# Patient Record
Sex: Female | Born: 1940 | Race: White | Hispanic: No | Smoking: Former smoker
Health system: Southern US, Community
[De-identification: ages and names within clinical notes are randomized; demographics above are authoritative.]

## PROBLEM LIST (undated history)

## (undated) DIAGNOSIS — F419 Anxiety disorder, unspecified: Secondary | ICD-10-CM

## (undated) DIAGNOSIS — I10 Essential (primary) hypertension: Secondary | ICD-10-CM

## (undated) DIAGNOSIS — I2510 Atherosclerotic heart disease of native coronary artery without angina pectoris: Secondary | ICD-10-CM

## (undated) DIAGNOSIS — Z87312 Personal history of (healed) stress fracture: Secondary | ICD-10-CM

## (undated) DIAGNOSIS — Z8672 Personal history of thrombophlebitis: Secondary | ICD-10-CM

## (undated) DIAGNOSIS — M1990 Unspecified osteoarthritis, unspecified site: Secondary | ICD-10-CM

## (undated) DIAGNOSIS — N189 Chronic kidney disease, unspecified: Secondary | ICD-10-CM

## (undated) DIAGNOSIS — J189 Pneumonia, unspecified organism: Secondary | ICD-10-CM

## (undated) DIAGNOSIS — C801 Malignant (primary) neoplasm, unspecified: Secondary | ICD-10-CM

## (undated) DIAGNOSIS — M8580 Other specified disorders of bone density and structure, unspecified site: Secondary | ICD-10-CM

## (undated) DIAGNOSIS — K219 Gastro-esophageal reflux disease without esophagitis: Secondary | ICD-10-CM

## (undated) HISTORY — DX: Personal history of (healed) stress fracture: Z87.312

## (undated) HISTORY — PX: RETINAL DETACHMENT SURGERY: SHX105

## (undated) HISTORY — DX: Personal history of thrombophlebitis: Z86.72

## (undated) HISTORY — DX: Other specified disorders of bone density and structure, unspecified site: M85.80

## (undated) HISTORY — PX: HERNIA REPAIR: SHX51

## (undated) HISTORY — PX: REPLACEMENT TOTAL KNEE: SUR1224

## (undated) HISTORY — PX: THYROIDECTOMY, PARTIAL: SHX18

## (undated) HISTORY — PX: CATARACT EXTRACTION W/ INTRAOCULAR LENS IMPLANT: SHX1309

## (undated) HISTORY — PX: FOOT SURGERY: SHX648

## (undated) HISTORY — DX: Unspecified osteoarthritis, unspecified site: M19.90

## (undated) HISTORY — DX: Essential (primary) hypertension: I10

---

## 1995-06-02 HISTORY — PX: KNEE ARTHROSCOPY: SUR90

## 2003-06-02 HISTORY — PX: THYROIDECTOMY, PARTIAL: SHX18

## 2005-06-01 LAB — HM COLONOSCOPY

## 2006-06-01 HISTORY — PX: RETINAL DETACHMENT SURGERY: SHX105

## 2007-06-02 LAB — HM COLONOSCOPY

## 2008-06-08 DIAGNOSIS — R911 Solitary pulmonary nodule: Secondary | ICD-10-CM | POA: Insufficient documentation

## 2008-06-08 DIAGNOSIS — M25559 Pain in unspecified hip: Secondary | ICD-10-CM | POA: Insufficient documentation

## 2010-02-27 DIAGNOSIS — R69 Illness, unspecified: Secondary | ICD-10-CM | POA: Insufficient documentation

## 2010-07-10 DIAGNOSIS — S92353A Displaced fracture of fifth metatarsal bone, unspecified foot, initial encounter for closed fracture: Secondary | ICD-10-CM | POA: Insufficient documentation

## 2010-09-17 DIAGNOSIS — L503 Dermatographic urticaria: Secondary | ICD-10-CM | POA: Insufficient documentation

## 2010-09-17 DIAGNOSIS — L820 Inflamed seborrheic keratosis: Secondary | ICD-10-CM | POA: Insufficient documentation

## 2010-10-24 DIAGNOSIS — N952 Postmenopausal atrophic vaginitis: Secondary | ICD-10-CM | POA: Insufficient documentation

## 2011-06-02 DIAGNOSIS — Z87312 Personal history of (healed) stress fracture: Secondary | ICD-10-CM

## 2011-06-02 HISTORY — DX: Personal history of (healed) stress fracture: Z87.312

## 2011-07-06 DIAGNOSIS — F419 Anxiety disorder, unspecified: Secondary | ICD-10-CM | POA: Insufficient documentation

## 2011-07-06 DIAGNOSIS — F32A Depression, unspecified: Secondary | ICD-10-CM | POA: Insufficient documentation

## 2011-07-06 DIAGNOSIS — K5730 Diverticulosis of large intestine without perforation or abscess without bleeding: Secondary | ICD-10-CM | POA: Insufficient documentation

## 2011-07-06 DIAGNOSIS — E042 Nontoxic multinodular goiter: Secondary | ICD-10-CM | POA: Insufficient documentation

## 2011-08-13 DIAGNOSIS — L859 Epidermal thickening, unspecified: Secondary | ICD-10-CM | POA: Insufficient documentation

## 2011-09-20 DIAGNOSIS — M2010 Hallux valgus (acquired), unspecified foot: Secondary | ICD-10-CM | POA: Insufficient documentation

## 2012-01-19 DIAGNOSIS — M79643 Pain in unspecified hand: Secondary | ICD-10-CM | POA: Insufficient documentation

## 2012-01-19 DIAGNOSIS — M5030 Other cervical disc degeneration, unspecified cervical region: Secondary | ICD-10-CM | POA: Insufficient documentation

## 2012-01-19 DIAGNOSIS — M542 Cervicalgia: Secondary | ICD-10-CM | POA: Insufficient documentation

## 2012-01-19 DIAGNOSIS — M545 Low back pain, unspecified: Secondary | ICD-10-CM | POA: Insufficient documentation

## 2012-01-19 DIAGNOSIS — M25669 Stiffness of unspecified knee, not elsewhere classified: Secondary | ICD-10-CM | POA: Insufficient documentation

## 2012-04-13 DIAGNOSIS — R350 Frequency of micturition: Secondary | ICD-10-CM | POA: Insufficient documentation

## 2012-06-01 LAB — HM PAP SMEAR: HM PAP: NORMAL

## 2012-07-13 DIAGNOSIS — M7740 Metatarsalgia, unspecified foot: Secondary | ICD-10-CM | POA: Insufficient documentation

## 2012-10-30 HISTORY — PX: REPLACEMENT TOTAL KNEE: SUR1224

## 2013-12-30 LAB — HM MAMMOGRAPHY: HM Mammogram: NORMAL

## 2015-02-20 DIAGNOSIS — H2513 Age-related nuclear cataract, bilateral: Secondary | ICD-10-CM | POA: Diagnosis not present

## 2015-03-05 ENCOUNTER — Encounter: Payer: Self-pay | Admitting: Behavioral Health

## 2015-03-05 ENCOUNTER — Telehealth: Payer: Self-pay | Admitting: Behavioral Health

## 2015-03-05 NOTE — Telephone Encounter (Signed)
Pre-Visit Call completed with patient and chart updated.   Pre-Visit Info documented in Specialty Comments under [employer].    

## 2015-03-06 ENCOUNTER — Encounter: Payer: Self-pay | Admitting: Family

## 2015-03-06 ENCOUNTER — Ambulatory Visit: Payer: Self-pay | Admitting: Family

## 2015-03-06 ENCOUNTER — Ambulatory Visit (INDEPENDENT_AMBULATORY_CARE_PROVIDER_SITE_OTHER): Payer: Medicare Other | Admitting: Family

## 2015-03-06 VITALS — BP 140/73 | HR 56 | Temp 98.6°F | Resp 16 | Ht 61.0 in | Wt 167.4 lb

## 2015-03-06 DIAGNOSIS — Z23 Encounter for immunization: Secondary | ICD-10-CM | POA: Diagnosis not present

## 2015-03-06 DIAGNOSIS — Z9289 Personal history of other medical treatment: Secondary | ICD-10-CM

## 2015-03-06 DIAGNOSIS — M8580 Other specified disorders of bone density and structure, unspecified site: Secondary | ICD-10-CM | POA: Diagnosis not present

## 2015-03-06 DIAGNOSIS — N289 Disorder of kidney and ureter, unspecified: Secondary | ICD-10-CM

## 2015-03-06 DIAGNOSIS — K219 Gastro-esophageal reflux disease without esophagitis: Secondary | ICD-10-CM | POA: Diagnosis not present

## 2015-03-06 DIAGNOSIS — Z960 Presence of urogenital implants: Secondary | ICD-10-CM

## 2015-03-06 DIAGNOSIS — I10 Essential (primary) hypertension: Secondary | ICD-10-CM

## 2015-03-06 LAB — BASIC METABOLIC PANEL
BUN: 27 mg/dL — AB (ref 6–23)
CALCIUM: 10.1 mg/dL (ref 8.4–10.5)
CO2: 31 mEq/L (ref 19–32)
CREATININE: 1.17 mg/dL (ref 0.40–1.20)
Chloride: 101 mEq/L (ref 96–112)
GFR: 47.99 mL/min — AB (ref >60.00)
GLUCOSE: 86 mg/dL (ref 70–99)
POTASSIUM: 3.6 meq/L (ref 3.5–5.1)
Sodium: 141 mEq/L (ref 135–145)

## 2015-03-06 NOTE — Progress Notes (Signed)
Pre visit review using our clinic review tool, if applicable. No additional management support is needed unless otherwise documented below in the visit note. 

## 2015-03-06 NOTE — Patient Instructions (Signed)
Please complete lab work prior to leaving. Schedule a medicare wellness at the front desk. Welcome to [facility]!

## 2015-03-06 NOTE — Progress Notes (Signed)
Subjective:    Patient ID: Margaret [name], female    DOB: 07/17/1940, 74 y.o.   MRN: [id number]  HPI  Margaret [name] is a 74 yr old female who presents today to establish care.   Pmhx is significant for HTN.  She is maintained on triamterene-HCTZ and atenolol.  BP Readings from Last 3 Encounters:  03/06/15 140/73   Osteopenia- Hx of stress fractures in her feet. Reports she was on fosamax "for years" (>10 yrs) but this was discontinued due to concern that fosamax was contributing to fracures.  She takes calcium supplement.  GERD-  Uses prilosec very infrequently  Patient reports that she has pain/burning in her feet at night which is not every night but is "awful" when it occurs. Pt reports that she was tested for b12 level and was normal.   She reports that she has hx of superficial blood clots when she was younger but has not had any since.  Never required any anticoagulation.  She has a vaginal pessary.   Review of Systems  Constitutional: Negative for unexpected weight change.  HENT: Negative for hearing loss and rhinorrhea.   Eyes: Negative for visual disturbance.  Respiratory: Negative for cough.   Cardiovascular: Negative for leg swelling.  Gastrointestinal: Negative for diarrhea and constipation.  Genitourinary: Negative for dysuria and frequency.  Musculoskeletal: Negative for myalgias and arthralgias.  Skin:       Occasional hives when she is nervous- has derm apt next week.  Neurological:       Occasional tension headaches  Hematological: Negative for adenopathy.  Psychiatric/Behavioral:       Denies depression/anxiety     Past Medical History  Diagnosis Date  . Hypertension   . Arthritis   . History of healed stress fracture 2013    bilateral feet    Social History   Social History  . Marital Status: Married    Spouse Name: N/A  . Number of Children: N/A  . Years of Education: N/A   Occupational History  . Not on file.   Social History Main  Topics  . Smoking status: Former Smoker    Quit date: 06/02/1979  . Smokeless tobacco: Not on file  . Alcohol Use: 0.6 oz/week    1 Glasses of wine per week  . Drug Use: No  . Sexual Activity: Not on file   Other Topics Concern  . Not on file   Social History Narrative  . No narrative on file    Past Surgical History  Procedure Laterality Date  . Thyroidectomy, partial  2005  . Retinal detachment surgery Left 2008  . 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 Knee arthroscopy Right 1997  . Foot surgery Left     bunion, hammer toe surgery  . Foot surgery Right     shaved bunion, hammer toe correction    Family History  Problem Relation Age of Onset  . Heart attack Mother   . Heart disease Father   . Diabetes Maternal Grandmother   . Cancer Paternal Grandfather     stomach    Allergies  Allergen Reactions  . Percocet [Oxycodone-Acetaminophen] Nausea And Vomiting and Other (See Comments)    Passed out    No current outpatient prescriptions on file prior to visit.   No current facility-administered medications on file prior to visit.    BP 140/73 mmHg  Pulse 56  Temp(Src) 98.6 F (37 C) (Oral)  Resp 16  Ht 5\' 1"  (1.549 m)  Wt 167 lb 6.4 oz (75.932 kg)  BMI 31.65 kg/m2  SpO2 99%       Objective:   Physical Exam  Constitutional: She is oriented to person, place, and time. She appears well-developed and well-nourished.  HENT:  Head: Normocephalic and atraumatic.  Right Ear: Tympanic membrane and ear canal normal.  Left Ear: Tympanic membrane and ear canal normal.  Mouth/Throat: No oropharyngeal exudate, posterior oropharyngeal edema or posterior oropharyngeal erythema.  Eyes: No scleral icterus.  Neck: No thyromegaly present.  Cardiovascular: Normal rate, regular rhythm and normal heart sounds.   No murmur heard. Pulmonary/Chest: Effort normal and breath sounds normal. No respiratory distress. She has no wheezes.  Musculoskeletal: She exhibits no edema.    Lymphadenopathy:    She has no cervical adenopathy.  Neurological: She is alert and oriented to person, place, and time.  Skin: Skin is warm and dry.  Psychiatric: She has a normal mood and affect. Her behavior is normal. Judgment and thought content normal.          Assessment & Plan:  Flu shot today Obtain bmet to assess renal function and electrolytes due to HTN/BP meds

## 2015-03-06 NOTE — Assessment & Plan Note (Signed)
Stable with rare use of prilosec.

## 2015-03-06 NOTE — Assessment & Plan Note (Signed)
BP fair on current meds.  Pt states usually better readings at home. Monitor on current meds.

## 2015-03-06 NOTE — Assessment & Plan Note (Signed)
Was on fosamax x >10 years.

## 2015-03-11 ENCOUNTER — Telehealth: Payer: Self-pay | Admitting: Family

## 2015-03-11 DIAGNOSIS — Z1231 Encounter for screening mammogram for malignant neoplasm of breast: Secondary | ICD-10-CM

## 2015-03-11 NOTE — Telephone Encounter (Signed)
Relation to PY:PPJK Call back number:[phone]   Reason for call:  Patient requesting mamo rx patient would like to go to [place] in [facility]

## 2015-03-12 NOTE — Telephone Encounter (Signed)
Referral placed. Left detailed message on pt's home # with radiology # to call and schedule mammogram.

## 2015-03-25 ENCOUNTER — Ambulatory Visit (HOSPITAL_BASED_OUTPATIENT_CLINIC_OR_DEPARTMENT_OTHER)
Admission: RE | Admit: 2015-03-25 | Discharge: 2015-03-25 | Disposition: A | Discharge status: Home / Self Care | Payer: Medicare Other | Source: Ambulatory Visit | Attending: Family | Admitting: Family

## 2015-03-25 DIAGNOSIS — Z1231 Encounter for screening mammogram for malignant neoplasm of breast: Secondary | ICD-10-CM | POA: Diagnosis not present

## 2015-06-21 ENCOUNTER — Ambulatory Visit: Payer: Self-pay | Admitting: Family Medicine

## 2015-10-14 ENCOUNTER — Encounter: Payer: Self-pay | Admitting: Family

## 2015-10-14 ENCOUNTER — Ambulatory Visit (INDEPENDENT_AMBULATORY_CARE_PROVIDER_SITE_OTHER): Payer: Medicare Other | Admitting: Family

## 2015-10-14 VITALS — BP 124/82 | HR 54 | Temp 97.4°F | Resp 18 | Ht 61.0 in | Wt 169.8 lb

## 2015-10-14 DIAGNOSIS — G629 Polyneuropathy, unspecified: Secondary | ICD-10-CM | POA: Diagnosis not present

## 2015-10-14 DIAGNOSIS — R3915 Urgency of urination: Secondary | ICD-10-CM | POA: Diagnosis not present

## 2015-10-14 DIAGNOSIS — G6289 Other specified polyneuropathies: Secondary | ICD-10-CM

## 2015-10-14 LAB — POCT URINALYSIS DIPSTICK
BILIRUBIN UA: NEGATIVE
Blood, UA: NEGATIVE
GLUCOSE UA: NEGATIVE
Ketones, UA: NEGATIVE
LEUKOCYTES UA: NEGATIVE
NITRITE UA: NEGATIVE
PROTEIN UA: NEGATIVE
SPEC GRAV UA: 1.015
Urobilinogen, UA: NEGATIVE
pH, UA: 6

## 2015-10-14 MED ORDER — CIPROFLOXACIN HCL 250 MG PO TABS: 250.0000 mg | ORAL_TABLET | Freq: Two times a day (BID) | ORAL | Status: DC

## 2015-10-14 MED ORDER — GABAPENTIN 300 MG PO CAPS: 300.0000 mg | ORAL_CAPSULE | Freq: Every day | ORAL | Status: DC

## 2015-10-14 NOTE — Patient Instructions (Signed)
Complete lab work prior to leaving. Begin cipro twice daily for urinary infection. Start gabapentin 300mg  one tab at bedtime for the leg pain.

## 2015-10-14 NOTE — Progress Notes (Signed)
Pre visit review using our clinic review tool, if applicable. No additional management support is needed unless otherwise documented below in the visit note. 

## 2015-10-14 NOTE — Progress Notes (Signed)
Subjective:    Patient ID: Margaret [name], female    DOB: 1940-09-29, 75 y.o.   MRN: [id number]  HPI  [name] is a 75 yr old female who presents today with c/o urinary urgency/incontinence. + urinary hesitency.  Denies dysuria, fever, back pain.   Reports tingling in her feet, "like bugs" at night. Makes it difficult for her to sleep.   Review of Systems See HPI  Past Medical History  Diagnosis Date  . Osteopenia   . Hypertension   . Arthritis   . History of healed stress fracture 2013    bilateral feet  . Hx of phlebitis     reports remote hx of "superfical blood clots" never anticoagulated     Social History   Social History  . Marital Status: Married    Spouse Name: N/A  . Number of Children: N/A  . Years of Education: N/A   Occupational History  . Not on file.   Social History Main Topics  . Smoking status: Former Smoker    Quit date: 06/02/1979  . Smokeless tobacco: Not on file  . Alcohol Use: 0.6 oz/week    1 Glasses of wine per week  . Drug Use: No  . Sexual Activity: Not on file   Other Topics Concern  . 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 Replacement total knee Left   . Foot surgery      patient reports four foot surgeries  . Thyroidectomy, partial Right   . Retinal detachment surgery    . Thyroidectomy, partial  2005  . Retinal detachment surgery Left 2008  . 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 Knee arthroscopy Right 1997  . Foot surgery Left     bunion, hammer toe surgery  . Foot surgery Right     shaved bunion, hammer toe correction    Family History  Problem Relation Age of Onset  . Heart disease Mother   . Heart attack Mother   . Heart disease Father   .  Diabetes Maternal Grandmother   . Cancer Paternal Grandfather     stomach    Allergies  Allergen Reactions  . Percocet [Oxycodone-Acetaminophen] Other (See Comments)    Pt. reports that the medication causes her to be light headed and pass out.  Marland Kitchen Percocet [Oxycodone-Acetaminophen] Nausea And Vomiting and Other (See Comments)    Passed out    Current Outpatient Prescriptions on File Prior to Visit  Medication Sig Dispense Refill  . atenolol (TENORMIN) 25 MG tablet Take 25 mg by mouth daily.  3  . Calcium Carb-Cholecalciferol (CALCIUM 600 + D) 600-200 MG-UNIT TABS Take 1 tablet by mouth 2 (two) times daily.    . naproxen sodium (ANAPROX) 220 MG tablet Take 220 mg by mouth 2 (two) times daily with a meal.    . omeprazole (PRILOSEC) 20 MG capsule Take 20 mg by mouth as needed.    . triamterene-hydrochlorothiazide (MAXZIDE-25) 37.5-25 MG tablet Take 1 tablet by mouth daily.     No current facility-administered medications on file prior to visit.    BP 124/82 mmHg  Pulse 54  Temp(Src) 97.4 F (36.3 C) (Oral)  Resp 18  Ht 5'  1" (1.549 m)  Wt 169 lb 12.8 oz (77.021 kg)  BMI 32.10 kg/m2  SpO2 100%       Objective:   Physical Exam  Constitutional: She is oriented to person, place, and time. She appears well-developed and well-nourished.  Eyes: No scleral icterus.  Cardiovascular: Normal rate, regular rhythm and normal heart sounds.   No murmur heard. Pulmonary/Chest: Effort normal and breath sounds normal. No respiratory distress. She has no wheezes.  Musculoskeletal: She exhibits no edema.  Neurological: She is alert and oriented to person, place, and time.  Skin: Skin is warm and dry.  Psychiatric: She has a normal mood and affect. Her behavior is normal. Judgment and thought content normal.          Assessment & Plan:  Urinary urgency- UA clean, symptoms resolved on own, monitor.

## 2015-10-15 LAB — VITAMIN B12: Vitamin B-12: 243 pg/mL (ref 211–911)

## 2015-10-17 ENCOUNTER — Telehealth: Payer: Self-pay | Admitting: Family

## 2015-10-17 ENCOUNTER — Encounter: Payer: Self-pay | Admitting: Family

## 2015-10-17 NOTE — Telephone Encounter (Signed)
Please let pt know that urine culture was not sent.  If she is still symptomatic I will send rx for abx.  Our apologies.

## 2015-10-18 ENCOUNTER — Telehealth: Payer: Self-pay | Admitting: Family

## 2015-10-18 DIAGNOSIS — G629 Polyneuropathy, unspecified: Secondary | ICD-10-CM | POA: Insufficient documentation

## 2015-10-18 MED ORDER — VITAMIN B-12 1000 MCG PO TABS: 1000.0000 ug | ORAL_TABLET | Freq: Every day | ORAL | Status: DC

## 2015-10-18 NOTE — Telephone Encounter (Signed)
Notified pt and she is not currently having any urinary symptoms.

## 2015-10-18 NOTE — Telephone Encounter (Signed)
Please let her know that her b12 level was low normal.  Add otc vit B12  1054mcg once daily, repeat b12 level in 3 months. Dx b12 deficiency.

## 2015-10-18 NOTE — Assessment & Plan Note (Signed)
Possibly due to low normal b12 level. Add oral supplement (see phone note)  Add trial of HS gabapentin.

## 2015-10-18 NOTE — Telephone Encounter (Signed)
Left message for pt to return my call.

## 2015-10-24 ENCOUNTER — Other Ambulatory Visit: Payer: Self-pay | Admitting: Family

## 2015-10-24 MED ORDER — ATENOLOL 25 MG PO TABS: 25.0000 mg | ORAL_TABLET | Freq: Every day | ORAL | Status: DC

## 2015-10-24 NOTE — Telephone Encounter (Signed)
Can be reached: [phone] Pharmacy: [id number] - [place], [address] AT [address]  Reason for call:  Pt called for refill on atenolol. She has 2 left. Takes 1/day. Needs sent for 90 day supply for insurance to cover.

## 2015-10-24 NOTE — Telephone Encounter (Signed)
Rx sent 

## 2015-11-25 DIAGNOSIS — Z96652 Presence of left artificial knee joint: Secondary | ICD-10-CM | POA: Diagnosis not present

## 2015-12-16 ENCOUNTER — Encounter: Payer: Self-pay | Admitting: Family

## 2015-12-16 ENCOUNTER — Ambulatory Visit (INDEPENDENT_AMBULATORY_CARE_PROVIDER_SITE_OTHER): Payer: Medicare Other | Admitting: Family

## 2015-12-16 ENCOUNTER — Telehealth: Payer: Self-pay | Admitting: Family

## 2015-12-16 VITALS — BP 100/70 | HR 51 | Temp 98.2°F | Resp 16 | Ht 61.0 in | Wt 169.6 lb

## 2015-12-16 DIAGNOSIS — G6289 Other specified polyneuropathies: Secondary | ICD-10-CM

## 2015-12-16 DIAGNOSIS — I10 Essential (primary) hypertension: Secondary | ICD-10-CM

## 2015-12-16 DIAGNOSIS — G4700 Insomnia, unspecified: Secondary | ICD-10-CM

## 2015-12-16 MED ORDER — OMEPRAZOLE 20 MG PO CPDR: 20.0000 mg | DELAYED_RELEASE_CAPSULE | ORAL | Status: DC | PRN

## 2015-12-16 MED ORDER — ALPRAZOLAM 0.25 MG PO TABS: ORAL_TABLET | ORAL | Status: DC

## 2015-12-16 NOTE — Patient Instructions (Signed)
Please increase your gabapentin to 3 times daily. For insomnia, you take xanax 1 tablet at bedtime as needed.

## 2015-12-16 NOTE — Assessment & Plan Note (Signed)
Uncontrolled. Advised pt to increase from QHS to bid for a few days, then TID if not too sleepy. If pain does not improve with dose increase, consider addition of renally dosed lyrica.

## 2015-12-16 NOTE — Telephone Encounter (Signed)
Please let pt know that I reviewed her chart and see that she is due for follow up bmet at her convenience (dx htn).

## 2015-12-16 NOTE — Progress Notes (Signed)
Pre visit review using our clinic review tool, if applicable. No additional management support is needed unless otherwise documented below in the visit note. 

## 2015-12-16 NOTE — Telephone Encounter (Signed)
I would like her to do sooner, but she can complete when she comes back from her vacation if she wants.

## 2015-12-16 NOTE — Telephone Encounter (Signed)
Pt called in. Informed her that an FU appt is needed per PCP note. Pt says that pcp informed her to come back in in 3 months. Scheduled pt for 3 months.

## 2015-12-16 NOTE — Progress Notes (Signed)
Subjective:    Patient ID: Margaret [name], female    DOB: [birth year], 75 y.o.   MRN: [id number]  HPI   Margaret [name] is a 75 yr old female who presents today with chief complaint of bilateral foot pain.  Pain has been present for several months. Has been using gabapentin at bedtime.  Notes "sometimes it seems to help and sometimes it doesn't.    Insomnia-  Reports difficulty falling and staying asleep through the night. This has always been a problem for her but it seems like the neuropathy is worse.  She is about to go out of town x 2 weeks. Is afraid she won't be able to sleep.     Review of Systems See HPI  Past Medical History  Diagnosis Date  . Osteopenia   . Hypertension   . Arthritis   . History of healed stress fracture 2013    bilateral feet  . Hx of phlebitis     reports remote hx of "superfical blood clots" never anticoagulated     Social History   Social History  . Marital Status: Married    Spouse Name: N/A  . Number of Children: N/A  . Years of Education: N/A   Occupational History  . Not on file.   Social History Main Topics  . Smoking status: Former Smoker    Quit date: 06/02/1979  . Smokeless tobacco: Not on file  . Alcohol Use: 0.6 oz/week    1 Glasses of wine per week  . Drug Use: No  . Sexual Activity: Not on file   Other Topics Concern  . 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 Replacement total knee Left   . Foot surgery      patient reports four foot surgeries  . Thyroidectomy, partial Right   . Retinal detachment surgery    . Thyroidectomy, partial  2005  . Retinal detachment surgery Left 2008  . 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 Knee arthroscopy Right 1997  . Foot  surgery Left     bunion, hammer toe surgery  . Foot surgery Right     shaved bunion, hammer toe correction    Family History  Problem Relation Age of Onset  . Heart disease Mother   . Heart attack Mother   . Heart disease Father   . Diabetes Maternal Grandmother   . Cancer Paternal Grandfather     stomach    Allergies  Allergen Reactions  . Percocet [Oxycodone-Acetaminophen] Other (See Comments)    Pt. reports that the medication causes her to be light headed and pass out.  Marland Kitchen Percocet [Oxycodone-Acetaminophen] Nausea And Vomiting and Other (See Comments)    Passed out    Current Outpatient Prescriptions on File Prior to Visit  Medication Sig Dispense Refill  . atenolol (TENORMIN) 25 MG tablet Take 1 tablet (25 mg total) by mouth daily. 90 tablet 0  . Calcium Carb-Cholecalciferol (CALCIUM 600 + D) 600-200 MG-UNIT TABS Take 1 tablet by mouth 2 (two) times daily.    Marland Kitchen gabapentin (NEURONTIN) 300 MG capsule Take 1 capsule (300 mg total) by mouth at bedtime. 30 capsule 3  . naproxen sodium (ANAPROX) 220 MG tablet  Take 220 mg by mouth 2 (two) times daily with a meal.    . omeprazole (PRILOSEC) 20 MG capsule Take 20 mg by mouth as needed.    . triamterene-hydrochlorothiazide (MAXZIDE-25) 37.5-25 MG tablet Take 1 tablet by mouth daily.     No current facility-administered medications on file prior to visit.    BP 100/70 mmHg  Pulse 51  Temp(Src) 98.2 F (36.8 C) (Oral)  Resp 16  Ht 5\' 1"  (1.549 m)  Wt 169 lb 9.6 oz (76.93 kg)  BMI 32.06 kg/m2  SpO2 97%       Objective:   Physical Exam  Constitutional: She is oriented to person, place, and time. She appears well-developed and well-nourished.  Cardiovascular: Normal rate, regular rhythm and normal heart sounds.   No murmur heard. Pulmonary/Chest: Effort normal and breath sounds normal. No respiratory distress. She has no wheezes.  Neurological: She is alert and oriented to person, place, and time.  Psychiatric: She has a  normal mood and affect. Her behavior is normal. Judgment and thought content normal.          Assessment & Plan:

## 2015-12-16 NOTE — Assessment & Plan Note (Signed)
Trial of small dose of xanax HS prn- short term.

## 2015-12-16 NOTE — Telephone Encounter (Signed)
Margaret [name]-- is this ok to wait until October for the bmet?

## 2015-12-17 NOTE — Telephone Encounter (Signed)
Notified pt and she states she will call us when she is back in town to schedule the lab appt for bmet. Future order placed.

## 2015-12-19 DIAGNOSIS — K5909 Other constipation: Secondary | ICD-10-CM | POA: Diagnosis not present

## 2015-12-19 DIAGNOSIS — N814 Uterovaginal prolapse, unspecified: Secondary | ICD-10-CM | POA: Diagnosis not present

## 2015-12-19 DIAGNOSIS — R3916 Straining to void: Secondary | ICD-10-CM | POA: Diagnosis not present

## 2015-12-19 DIAGNOSIS — N812 Incomplete uterovaginal prolapse: Secondary | ICD-10-CM | POA: Diagnosis not present

## 2016-01-01 ENCOUNTER — Other Ambulatory Visit: Payer: Self-pay | Admitting: Podiatry

## 2016-01-01 ENCOUNTER — Ambulatory Visit (HOSPITAL_BASED_OUTPATIENT_CLINIC_OR_DEPARTMENT_OTHER)
Admission: RE | Admit: 2016-01-01 | Discharge: 2016-01-01 | Disposition: A | Discharge status: Home / Self Care | Payer: Medicare Other | Source: Ambulatory Visit | Attending: Podiatry | Admitting: Podiatry

## 2016-01-01 ENCOUNTER — Ambulatory Visit (INDEPENDENT_AMBULATORY_CARE_PROVIDER_SITE_OTHER): Payer: Medicare Other | Admitting: Podiatry

## 2016-01-01 ENCOUNTER — Encounter: Payer: Self-pay | Admitting: Podiatry

## 2016-01-01 VITALS — BP 155/87 | HR 52 | Resp 18

## 2016-01-01 DIAGNOSIS — M8430XA Stress fracture, unspecified site, initial encounter for fracture: Secondary | ICD-10-CM

## 2016-01-01 DIAGNOSIS — X58XXXA Exposure to other specified factors, initial encounter: Secondary | ICD-10-CM | POA: Insufficient documentation

## 2016-01-01 DIAGNOSIS — G629 Polyneuropathy, unspecified: Secondary | ICD-10-CM

## 2016-01-01 DIAGNOSIS — M84374A Stress fracture, right foot, initial encounter for fracture: Secondary | ICD-10-CM | POA: Diagnosis not present

## 2016-01-01 DIAGNOSIS — M19071 Primary osteoarthritis, right ankle and foot: Secondary | ICD-10-CM | POA: Insufficient documentation

## 2016-01-01 DIAGNOSIS — L84 Corns and callosities: Secondary | ICD-10-CM | POA: Diagnosis not present

## 2016-01-01 DIAGNOSIS — S92351A Displaced fracture of fifth metatarsal bone, right foot, initial encounter for closed fracture: Secondary | ICD-10-CM | POA: Diagnosis not present

## 2016-01-01 NOTE — Progress Notes (Signed)
   Subjective:    Patient ID: Margaret [name], female    DOB: Oct 06, 1940, 75 y.o.   MRN: [id number]  HPI  75 year old female presents the office of consent the calluses to both of her feet. She states that they're painful with pressure in shoe gear. Centimeter blemishes had increasing pain to the right foot. She points on the fifth metatarsal which is the majority of pain. She is concerned for possible stress fracture. She does have a history of surgery to both of her second toes as well as the bunion on the left foot. She also has neuropathy which she takes gabapentin she still gets some burning pain at night. No other complaints. No recent injury or trauma.  Review of Systems  All other systems reviewed and are negative.      Objective:   Physical Exam General: AAO x3, NAD  Dermatological: Hyperkeratotic lesions present submetatarsal 2. No underlying ulceration, drainage or other signs of infection. No other open lesions or pre-ulcer lesions identified at this time.  Vascular: Dorsalis Pedis artery and Posterior Tibial artery pedal pulses are 2/4 bilateral with immedate capillary fill time. Pedal hair growth present. Varicose veins are present. There is no pain with calf compression, swelling, warmth, erythema.   Neruologic: Sensation decreased with Derrel Nip monofilament   Musculoskeletal: Cavus foot type present bilaterally. There is tenderness palpation on the fourth, fifth, third metatarsal of the right foot. Digits are to be some mild overlying edema without any erythema or increase in warmth. There is no other areas of tenderness bilaterally. MMT 5/5.  Gait: Unassisted, Nonantalgic.      Assessment & Plan:  Symptomatic hyperkeratotic lesions 2, possible stress fracture right foot, neuropathy -Treatment options discussed including all alternatives, risks, and complications -Etiology of symptoms were discussed -At this time she does have symptoms the stress fracture.  We'll go ahead and placed into a surgical shoe today. Ordered x-rays. Elevation. -Order compound cream for neuropathy in conjunction with oral gabapentin. -Hyperkeratotic lesions debrided 2 without couple complications or bleeding. -Follow-up in 3 weeks or sooner if any issues are to arise. Call any questions or concerns in the meantime.  Celesta Gentile, DPM

## 2016-01-02 ENCOUNTER — Telehealth: Payer: Self-pay | Admitting: *Deleted

## 2016-01-02 NOTE — Telephone Encounter (Addendum)
-----   Message from Trula Slade, DPM sent at 01/01/2016  7:15 PM EDT ----- Can you let her know that her x-ray is consistent with a stress fracture and she needs to stay in the surgical shoe. Follow-up in 3 weeks. 01/02/2016-Informed pt of Dr. Leigh Aurora x-ray review and orders.

## 2016-01-03 ENCOUNTER — Telehealth: Payer: Self-pay | Admitting: *Deleted

## 2016-01-03 MED ORDER — NONFORMULARY OR COMPOUNDED ITEM: 2 refills | Status: DC

## 2016-01-03 NOTE — Telephone Encounter (Signed)
Dr. [name] ordered [facility] Peripheral Neuropathy cream.  Faxed to [facility].

## 2016-01-22 ENCOUNTER — Ambulatory Visit (HOSPITAL_BASED_OUTPATIENT_CLINIC_OR_DEPARTMENT_OTHER)
Admission: RE | Admit: 2016-01-22 | Discharge: 2016-01-22 | Disposition: A | Discharge status: Home / Self Care | Payer: Medicare Other | Source: Ambulatory Visit | Attending: Podiatry | Admitting: Podiatry

## 2016-01-22 ENCOUNTER — Ambulatory Visit (INDEPENDENT_AMBULATORY_CARE_PROVIDER_SITE_OTHER): Payer: Medicare Other | Admitting: Podiatry

## 2016-01-22 ENCOUNTER — Encounter: Payer: Self-pay | Admitting: Podiatry

## 2016-01-22 DIAGNOSIS — M79671 Pain in right foot: Secondary | ICD-10-CM | POA: Diagnosis not present

## 2016-01-22 DIAGNOSIS — M8430XD Stress fracture, unspecified site, subsequent encounter for fracture with routine healing: Secondary | ICD-10-CM | POA: Diagnosis not present

## 2016-01-22 DIAGNOSIS — Q828 Other specified congenital malformations of skin: Secondary | ICD-10-CM | POA: Diagnosis not present

## 2016-01-22 DIAGNOSIS — R52 Pain, unspecified: Secondary | ICD-10-CM

## 2016-01-24 NOTE — Progress Notes (Signed)
Subjective: 75 year old female presents the office they for follow-up evaluation of stress fracture of the right foot. She points the fifth metatarsal where she still getting some pain. She's remain in the surgical shoe. She also has a callus in the right foot which is painful. She denies any swelling or redness or any drainage. Denies any systemic complaints such as fevers, chills, nausea, vomiting. No acute changes since last appointment, and no other complaints at this time.   Objective: AAO x3, NAD DP/PT pulses palpable bilaterally, CRT less than 3 seconds There is continued tenderness the fifth metatarsal diaphysis and the right foot. There is trace edema to this area a erythema or increase in warmth. On the plantar aspect of the fifth metatarsal base is a hyperkeratotic lesion. Upon edema there is no underlying ulceration, drainage or other signs of infection. No other open lesions or pre-ulcerative lesions identified. No other areas of tenderness bilateral. calf compression, swelling, warmth, erythema.  Assessment: 75 year old female right fifth metatarsal diaphysis stress fracture, porokeratosis  Plan: -Treatment options discussed including all alternatives, risks, and complications -Repeated x-rays were obtained and reviewed with the patient today. There is continued be a radiolucent line consistent with a fracture of the diaphysis the fifth metatarsal. Abdomen continuing the surgical shoe. Elevation and ice. Hyperkeratotic lesion today debrided 1 without couple complications or bleeding. Follow-up as scheduled or sooner if needed. Call with any question or concerns in the meantime.  Celesta Gentile, DPM

## 2016-01-29 ENCOUNTER — Other Ambulatory Visit: Payer: Self-pay | Admitting: Family

## 2016-01-29 ENCOUNTER — Telehealth: Payer: Self-pay | Admitting: Family

## 2016-01-29 MED ORDER — METOPROLOL SUCCINATE ER 25 MG PO TB24: 25.0000 mg | ORAL_TABLET | Freq: Every day | ORAL | 3 refills | Status: DC

## 2016-01-29 NOTE — Telephone Encounter (Signed)
Received call from [facility] - [place], [address] AT [address]  Atenolol is on manufacturer back order. She is asking if there is an alternate med to prescribe for pt.

## 2016-01-29 NOTE — Telephone Encounter (Signed)
D/c propranolol, begin metoprolol xl. Nurse visit bp check in 2 weeks please.

## 2016-01-30 NOTE — Telephone Encounter (Signed)
LMOM informing Pt to d/c Atenolol and start Metoprolol 25 mg 1 tab daily (Rx sent to [facility] by Lenna Sciara). Instructed Pt to call and schedule nurse visit to check BP in 2 weeks.

## 2016-01-31 NOTE — Telephone Encounter (Signed)
Pt called in to return call. Pt would like a call back as soon as possible.

## 2016-01-31 NOTE — Telephone Encounter (Signed)
Attempted to reach pt and left detailed message on cell# re: medication change and need to call and schedule nurse visit BP check in 2 weeks.

## 2016-02-05 ENCOUNTER — Telehealth: Payer: Self-pay | Admitting: Family

## 2016-02-05 MED ORDER — GABAPENTIN 300 MG PO CAPS: 300.0000 mg | ORAL_CAPSULE | Freq: Every day | ORAL | 1 refills | Status: DC

## 2016-02-05 NOTE — Telephone Encounter (Signed)
Caller name: Relationship to patient: Self  Can be reached: [phone]  Pharmacy:  [facility] [place], [address] AT [address] [phone] (Phone) [phone] (Fax)     Reason for call: Request refill on gabapentin (NEURONTIN) 300 MG capsule [id number]

## 2016-02-10 ENCOUNTER — Other Ambulatory Visit: Payer: Self-pay | Admitting: Family

## 2016-02-10 ENCOUNTER — Telehealth: Payer: Self-pay | Admitting: Family

## 2016-02-10 MED ORDER — TRIAMTERENE-HCTZ 37.5-25 MG PO TABS: 1.0000 | ORAL_TABLET | Freq: Every day | ORAL | 5 refills | Status: DC

## 2016-02-10 MED ORDER — GABAPENTIN 300 MG PO CAPS: 900.0000 mg | ORAL_CAPSULE | Freq: Every day | ORAL | 1 refills | Status: DC

## 2016-02-10 NOTE — Addendum Note (Signed)
Addended by: Kelle Darting A on: 02/10/2016 11:52 AM   Modules accepted: Orders

## 2016-02-10 NOTE — Telephone Encounter (Signed)
Patient in need of clarification regarding direction for gabapentin (NEURONTIN) 300 MG capsule . Please advise

## 2016-02-10 NOTE — Telephone Encounter (Signed)
Relation to [id number] Call back number:[phone] Pharmacy: [facility] [place], [address] AT [address] [phone] (Phone) [phone] (Fax)     Reason for call:  Patient requesting a refill triamterene-hydrochlorothiazide (MAXZIDE-25) 37.5-25 MG tablet

## 2016-02-10 NOTE — Telephone Encounter (Signed)
Pt states PCP increased gabapentin to 3 capsules at bedtime at her last visit on 12/16/15. Verified increase per last office note and send Rx with new directions. Pt aware.

## 2016-02-14 ENCOUNTER — Ambulatory Visit: Payer: Medicare Other | Admitting: Family Medicine

## 2016-02-14 VITALS — BP 120/86 | HR 64

## 2016-02-14 DIAGNOSIS — I10 Essential (primary) hypertension: Secondary | ICD-10-CM

## 2016-02-14 NOTE — Progress Notes (Signed)
Pre visit review using our clinic review tool, if applicable. No additional management support is needed unless otherwise documented below in the visit note.  Patient presents in office today for blood pressure check per Telephone note 01/29/16. Reviewed medications with the patient. Reading was as follow: BP 120/86 P 64.  Per Dr. Nani Ravens: Continue current medication regimen and keep follow-up appointment with PCP on 03/17/16 at 1:15 PM.   Informed patient of the provider's instructions. She voiced understanding and did not have any concerns before leaving the nurse visit.

## 2016-02-14 NOTE — Patient Instructions (Addendum)
Per Dr. Nani Ravens: Continue current medication regimen and keep follow-up appointment with PCP on 03/17/16 at 1:15 PM.

## 2016-02-18 ENCOUNTER — Ambulatory Visit (INDEPENDENT_AMBULATORY_CARE_PROVIDER_SITE_OTHER): Payer: Medicare Other | Admitting: Podiatry

## 2016-02-18 ENCOUNTER — Ambulatory Visit (HOSPITAL_BASED_OUTPATIENT_CLINIC_OR_DEPARTMENT_OTHER)
Admission: RE | Admit: 2016-02-18 | Discharge: 2016-02-18 | Disposition: A | Discharge status: Home / Self Care | Payer: Medicare Other | Source: Ambulatory Visit | Attending: Podiatry | Admitting: Podiatry

## 2016-02-18 ENCOUNTER — Encounter: Payer: Self-pay | Admitting: Podiatry

## 2016-02-18 DIAGNOSIS — R52 Pain, unspecified: Secondary | ICD-10-CM | POA: Insufficient documentation

## 2016-02-18 DIAGNOSIS — M19071 Primary osteoarthritis, right ankle and foot: Secondary | ICD-10-CM | POA: Insufficient documentation

## 2016-02-18 DIAGNOSIS — M8430XD Stress fracture, unspecified site, subsequent encounter for fracture with routine healing: Secondary | ICD-10-CM

## 2016-02-18 DIAGNOSIS — M2021 Hallux rigidus, right foot: Secondary | ICD-10-CM | POA: Diagnosis not present

## 2016-02-18 DIAGNOSIS — Q828 Other specified congenital malformations of skin: Secondary | ICD-10-CM

## 2016-02-18 DIAGNOSIS — M2011 Hallux valgus (acquired), right foot: Secondary | ICD-10-CM | POA: Diagnosis not present

## 2016-02-18 NOTE — Progress Notes (Signed)
Subjective: 75 year old female presents the office they for follow-up evaluation of stress fracture of the right foot. She states that she is doing much better she's not having any pain to her foot and she is continued surgical shoe. Chest shows calluses to both her feet which are painful but denies any redness or drainage or any swelling. Denies any systemic complaints such as fevers, chills, nausea, vomiting. No acute changes since last appointment, and no other complaints at this time.   Objective: AAO x3, NAD DP/PT pulses palpable bilaterally, CRT less than 3 seconds There is no tenderness the fifth metatarsal diaphysis and the right foot. There is no edema to this area a erythema or increase in warmth. On the plantar aspect of the fifth metatarsal base is a hyperkeratotic lesion bilaterally. Upon debridement there is no underlying ulceration, drainage or other signs of infection. No other open lesions or pre-ulcerative lesions identified. No other areas of tenderness bilateral. calf compression, swelling, warmth, erythema. HAV present on the right.   Assessment: 75 year old female right fifth metatarsal diaphysis stress fracture, porokeratosis  Plan: -Treatment options discussed including all alternatives, risks, and complications -Repeated x-rays were obtained and reviewed with the patient today. There is evidence of healing across the fracture site. -At this time she inserted transition to regular shoe as tolerated. Monitoring reoccurrence. -Hyperkeratotic lesions debrided without complications or bleeding. -Follow-up as needed. Call any questions concerns.  Celesta Gentile, DPM

## 2016-02-25 DIAGNOSIS — H43813 Vitreous degeneration, bilateral: Secondary | ICD-10-CM | POA: Diagnosis not present

## 2016-02-25 DIAGNOSIS — H2513 Age-related nuclear cataract, bilateral: Secondary | ICD-10-CM | POA: Diagnosis not present

## 2016-02-25 DIAGNOSIS — H40031 Anatomical narrow angle, right eye: Secondary | ICD-10-CM | POA: Diagnosis not present

## 2016-02-26 ENCOUNTER — Telehealth: Payer: Self-pay | Admitting: Family

## 2016-02-26 MED ORDER — AMOXICILLIN 500 MG PO CAPS: 2000.0000 mg | ORAL_CAPSULE | Freq: Once | ORAL | 0 refills | Status: DC

## 2016-02-26 NOTE — Telephone Encounter (Signed)
Notified pt. 

## 2016-02-26 NOTE — Telephone Encounter (Signed)
rx sent

## 2016-02-26 NOTE — Telephone Encounter (Signed)
°  Relation to [id number]  Call back number: [phone] Pharmacy: [facility] [place], [address] AT [facility] OF [address] [phone] (Phone) [phone] (Fax)     Reason for call:  Patient requesting antibiotics for an upcoming dental cleaning.

## 2016-03-02 ENCOUNTER — Other Ambulatory Visit: Payer: Self-pay | Admitting: Family

## 2016-03-02 DIAGNOSIS — Z1231 Encounter for screening mammogram for malignant neoplasm of breast: Secondary | ICD-10-CM

## 2016-03-17 ENCOUNTER — Ambulatory Visit (INDEPENDENT_AMBULATORY_CARE_PROVIDER_SITE_OTHER): Payer: Medicare Other | Admitting: Family

## 2016-03-17 ENCOUNTER — Encounter: Payer: Self-pay | Admitting: Family

## 2016-03-17 ENCOUNTER — Ambulatory Visit (HOSPITAL_BASED_OUTPATIENT_CLINIC_OR_DEPARTMENT_OTHER)
Admission: RE | Admit: 2016-03-17 | Discharge: 2016-03-17 | Disposition: A | Discharge status: Home / Self Care | Payer: Medicare Other | Source: Ambulatory Visit | Attending: Family | Admitting: Family

## 2016-03-17 ENCOUNTER — Telehealth: Payer: Self-pay | Admitting: *Deleted

## 2016-03-17 VITALS — BP 115/78 | HR 54 | Temp 97.6°F | Resp 16 | Ht 61.0 in | Wt 177.8 lb

## 2016-03-17 DIAGNOSIS — E2839 Other primary ovarian failure: Secondary | ICD-10-CM | POA: Insufficient documentation

## 2016-03-17 DIAGNOSIS — Z780 Asymptomatic menopausal state: Secondary | ICD-10-CM | POA: Diagnosis not present

## 2016-03-17 DIAGNOSIS — Z960 Presence of urogenital implants: Secondary | ICD-10-CM

## 2016-03-17 DIAGNOSIS — Z9289 Personal history of other medical treatment: Secondary | ICD-10-CM

## 2016-03-17 DIAGNOSIS — Z23 Encounter for immunization: Secondary | ICD-10-CM

## 2016-03-17 DIAGNOSIS — R7989 Other specified abnormal findings of blood chemistry: Secondary | ICD-10-CM | POA: Diagnosis not present

## 2016-03-17 DIAGNOSIS — E669 Obesity, unspecified: Secondary | ICD-10-CM

## 2016-03-17 DIAGNOSIS — I10 Essential (primary) hypertension: Secondary | ICD-10-CM | POA: Diagnosis not present

## 2016-03-17 DIAGNOSIS — E538 Deficiency of other specified B group vitamins: Secondary | ICD-10-CM

## 2016-03-17 DIAGNOSIS — Z4689 Encounter for fitting and adjustment of other specified devices: Secondary | ICD-10-CM

## 2016-03-17 DIAGNOSIS — Z87891 Personal history of nicotine dependence: Secondary | ICD-10-CM | POA: Diagnosis not present

## 2016-03-17 DIAGNOSIS — Z6833 Body mass index (BMI) 33.0-33.9, adult: Secondary | ICD-10-CM

## 2016-03-17 DIAGNOSIS — G6289 Other specified polyneuropathies: Secondary | ICD-10-CM

## 2016-03-17 DIAGNOSIS — Z79899 Other long term (current) drug therapy: Secondary | ICD-10-CM | POA: Diagnosis not present

## 2016-03-17 DIAGNOSIS — G4700 Insomnia, unspecified: Secondary | ICD-10-CM

## 2016-03-17 MED ORDER — GABAPENTIN 300 MG PO CAPS: 300.0000 mg | ORAL_CAPSULE | Freq: Three times a day (TID) | ORAL | 0 refills | Status: DC

## 2016-03-17 NOTE — Assessment & Plan Note (Signed)
Advised her to change from advil pm to benadryl prn, but use sparingly.

## 2016-03-17 NOTE — Telephone Encounter (Signed)
Pt signed records release to [facility] in [place], [place] for immunization records. Pt reports pneumonia vaccine but we need to verify if it was pneumovax or Prevnar. Release faxed to [phone]. Was told turnaround time is 5-7 days. Awaiting record.

## 2016-03-17 NOTE — Assessment & Plan Note (Signed)
She has been taking gabapentin 900mg  at bedtime. I advised her to change to 300mg  po tid to see if this helps. B12 level low normal previously. Will recheck.

## 2016-03-17 NOTE — Assessment & Plan Note (Signed)
BP is stable on current meds.  Continue same.  

## 2016-03-17 NOTE — Progress Notes (Signed)
Pre visit review using our clinic review tool, if applicable. No additional management support is needed unless otherwise documented below in the visit note. 

## 2016-03-17 NOTE — Progress Notes (Signed)
Subjective:    Patient ID: Margaret [name], female    DOB: 1940-07-04, 75 y.o.   MRN: [id number]  HPI  [name] is a 75 yr old female who presents today for follow up.  Insomnia- she was given a short term rx for prn xanax HS. Did not help so she stopped.  Uses advil pm.    Polyneuropathy- last visit she was advised to increase gabapentin from QHS to bid, then TID as tolerated.    HTN_  Maintained on maxide and toprol xl.   BP Readings from Last 3 Encounters:  03/17/16 115/78  02/14/16 120/86  01/01/16 (!) 155/87    Review of Systems See HPI  Past Medical History:  Diagnosis Date  . Arthritis   . History of healed stress fracture 2013   bilateral feet  . Hx of phlebitis    reports remote hx of "superfical blood clots" never anticoagulated  . Hypertension   . Osteopenia      Social History   Social History  . Marital status: Married    Spouse name: N/A  . Number of children: N/A  . Years of education: N/A   Occupational History  . Not on file.   Social History Main Topics  . Smoking status: Former Smoker    Quit date: 06/02/1979  . Smokeless tobacco: Not on file  . Alcohol use 0.6 oz/week    1 Glasses of wine per week  . Drug use: No  . Sexual activity: Not on file   Other Topics Concern  . 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 FOOT SURGERY     patient reports four foot surgeries  . FOOT SURGERY Left    bunion, hammer toe surgery  . FOOT SURGERY Right    shaved bunion, hammer toe correction  . KNEE ARTHROSCOPY Right 1997  . REPLACEMENT TOTAL KNEE Left   . 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 RETINAL DETACHMENT SURGERY    . RETINAL DETACHMENT SURGERY Left 2008  . THYROIDECTOMY,  PARTIAL Right   . THYROIDECTOMY, PARTIAL  2005    Family History  Problem Relation Age of Onset  . Heart disease Mother   . Heart attack Mother   . Heart disease Father   . Diabetes Maternal Grandmother   . Cancer Paternal Grandfather     stomach    Allergies  Allergen Reactions  . Percocet [Oxycodone-Acetaminophen] Other (See Comments)    Pt. reports that the medication causes her to be light headed and pass out.  Marland Kitchen Percocet [Oxycodone-Acetaminophen] Nausea And Vomiting and Other (See Comments)    Passed out    Current Outpatient Prescriptions on File Prior to Visit  Medication Sig Dispense Refill  . ALPRAZolam (XANAX) 0.25 MG tablet 1 tablet by mouth QHS prn 20 tablet 0  . Calcium Carb-Cholecalciferol (CALCIUM 600 + D) 600-200 MG-UNIT TABS Take 1 tablet by mouth 2 (two) times daily.    Marland Kitchen gabapentin (NEURONTIN) 300 MG capsule TAKE 3 CAPSULES(900 MG) BY MOUTH AT BEDTIME 270 capsule 0  . metoprolol succinate (TOPROL-XL) 25 MG 24 hr tablet Take 1 tablet (25 mg total) by mouth daily. 30 tablet 3  . naproxen sodium (ANAPROX)  220 MG tablet Take 220 mg by mouth 2 (two) times daily with a meal.    . NONFORMULARY OR COMPOUNDED ITEM [facility]:  Perpheral Neuropathy Cream - Bupivacaine 1%, Doxepin 3%, Gabapentin 6%, Pentoxifylline 3%, Topiramate 1%, apply 1-2 grams to affected area 3-4 times daily. 120 each 2  . omeprazole (PRILOSEC) 20 MG capsule Take 1 capsule (20 mg total) by mouth as needed. 30 capsule 5  . triamterene-hydrochlorothiazide (MAXZIDE-25) 37.5-25 MG tablet Take 1 tablet by mouth daily. 30 tablet 5   No current facility-administered medications on file prior to visit.     BP 115/78 (BP Location: Left Arm, Patient Position: Sitting, Cuff Size: Normal)   Pulse (!) 54   Temp 97.6 F (36.4 C) (Oral)   Resp 16   Ht 5\' 1"  (1.549 m)   Wt 177 lb 12.8 oz (80.6 kg)   SpO2 99% Comment: room air  BMI 33.60 kg/m       Objective:   Physical Exam  Constitutional: She  is oriented to person, place, and time. She appears well-developed and well-nourished.  HENT:  Head: Normocephalic and atraumatic.  Cardiovascular: Normal rate, regular rhythm and normal heart sounds.   No murmur heard. Pulmonary/Chest: Effort normal and breath sounds normal. No respiratory distress. She has no wheezes.  Musculoskeletal: She exhibits no edema.  Neurological: She is alert and oriented to person, place, and time.  Psychiatric: She has a normal mood and affect. Her behavior is normal. Judgment and thought content normal.          Assessment & Plan:

## 2016-03-17 NOTE — Assessment & Plan Note (Signed)
Refer to GYN for pessary care- she is due for removal in November.

## 2016-03-17 NOTE — Patient Instructions (Addendum)
Please complete lab work prior to leaving. Change gabapentin to 1 cap tid Follow up in 3 months.

## 2016-03-18 ENCOUNTER — Encounter: Payer: Self-pay | Admitting: Family

## 2016-03-18 LAB — BASIC METABOLIC PANEL
BUN: 29 mg/dL — ABNORMAL HIGH (ref 6–23)
CALCIUM: 10 mg/dL (ref 8.4–10.5)
CO2: 31 meq/L (ref 19–32)
Chloride: 101 mEq/L (ref 96–112)
Creatinine, Ser: 1.2 mg/dL (ref 0.40–1.20)
GFR: 46.47 mL/min — AB (ref >60.00)
Glucose, Bld: 85 mg/dL (ref 70–99)
POTASSIUM: 4 meq/L (ref 3.5–5.1)
SODIUM: 140 meq/L (ref 135–145)

## 2016-03-18 LAB — VITAMIN B12: Vitamin B-12: 268 pg/mL (ref 211–911)

## 2016-03-26 ENCOUNTER — Ambulatory Visit (HOSPITAL_BASED_OUTPATIENT_CLINIC_OR_DEPARTMENT_OTHER)
Admission: RE | Admit: 2016-03-26 | Discharge: 2016-03-26 | Disposition: A | Discharge status: Home / Self Care | Payer: Medicare Other | Source: Ambulatory Visit | Attending: Family | Admitting: Family

## 2016-03-26 DIAGNOSIS — Z1231 Encounter for screening mammogram for malignant neoplasm of breast: Secondary | ICD-10-CM | POA: Diagnosis not present

## 2016-04-01 DIAGNOSIS — Z23 Encounter for immunization: Secondary | ICD-10-CM | POA: Diagnosis not present

## 2016-04-01 DIAGNOSIS — L820 Inflamed seborrheic keratosis: Secondary | ICD-10-CM | POA: Diagnosis not present

## 2016-04-01 DIAGNOSIS — L700 Acne vulgaris: Secondary | ICD-10-CM | POA: Diagnosis not present

## 2016-04-01 DIAGNOSIS — L821 Other seborrheic keratosis: Secondary | ICD-10-CM | POA: Diagnosis not present

## 2016-04-07 ENCOUNTER — Encounter: Payer: Self-pay | Admitting: Podiatry

## 2016-04-07 ENCOUNTER — Ambulatory Visit (INDEPENDENT_AMBULATORY_CARE_PROVIDER_SITE_OTHER): Payer: Medicare Other | Admitting: Podiatry

## 2016-04-07 DIAGNOSIS — Q828 Other specified congenital malformations of skin: Secondary | ICD-10-CM | POA: Diagnosis not present

## 2016-04-07 DIAGNOSIS — M2010 Hallux valgus (acquired), unspecified foot: Secondary | ICD-10-CM

## 2016-04-08 NOTE — Progress Notes (Signed)
Subjective: Patient presents the office today for painful calluses to both of her feet. When she was living in [place] that she was going to callus trims. She states that they've gotten very thick and painful to walk. Denies any redness or drainage or any swelling. She states the fracture site of the right fifth toe is doing well she's having no pain or any swelling. Denies any systemic complaints such as fevers, chills, nausea, vomiting. No acute changes since last appointment, and no other complaints at this time.   Objective: AAO x3, NAD DP/PT pulses palpable bilaterally, CRT less than 3 seconds There is annular, deep hyperkeratotic lesions present submetatarsal bilaterally, fifth metatarsal base laterally bilaterally as well as midfoot plantarly. Upon removal there is no underlying ulceration, drainage or any signs of infection. There is no tenderness palpation to the right fifth metatarsal base. There is no other area pinpoint bony tenderness.  No open lesions or pre-ulcerative lesions.  No pain with calf compression, swelling, warmth, erythema  Assessment: Porokeratosis bilaterally  Plan: -All treatment options discussed with the patient including all alternatives, risks, complications.  -Hyperkeratotic is debrided 6. Upon debridement of the right midfoot plantar lesion small amount of bleeding occurred. Neosporin and a bandage was applied. Monitor for infection. -Follow-up in 6 weeks or sooner if needed.  -Patient encouraged to call the office with any questions, concerns, change in symptoms.   Celesta Gentile, DPM

## 2016-04-28 ENCOUNTER — Other Ambulatory Visit: Payer: Self-pay | Admitting: Family

## 2016-04-29 NOTE — Telephone Encounter (Signed)
Refill sent per [facility] refill protocol/SLS  

## 2016-05-04 ENCOUNTER — Ambulatory Visit (INDEPENDENT_AMBULATORY_CARE_PROVIDER_SITE_OTHER): Payer: Medicare Other | Admitting: Obstetrics & Gynecology

## 2016-05-04 ENCOUNTER — Encounter: Payer: Self-pay | Admitting: Obstetrics & Gynecology

## 2016-05-04 VITALS — BP 136/77 | HR 68 | Ht 62.0 in | Wt 178.0 lb

## 2016-05-04 DIAGNOSIS — N393 Stress incontinence (female) (male): Secondary | ICD-10-CM | POA: Diagnosis not present

## 2016-05-04 DIAGNOSIS — N8110 Cystocele, unspecified: Secondary | ICD-10-CM

## 2016-05-04 NOTE — Progress Notes (Signed)
History:  75 y.o. [id number] here today for management of pessary. Pt reports that she has been wearing a pessary for 7 years.  Pt denies problems from the pessary. She reports occ leakage of urine that she does not consider a problem and doe snot want a further workup for.  She moved to [place] from [place] 1 year prev and has been commuting every 4 months to her GYN in [place] for the past year.   The current pessary is relatively new within the year.  It was upsized on replacement.  She does not remove the pessary herself at all.  Her last PAP was at her last visit 4 months prev.  The following portions of the patient's history were reviewed and updated as appropriate: allergies, current medications, past family history, past medical history, past social history, past surgical history and problem list.  Review of Systems:  Pertinent items are noted in HPI.   Objective:  Physical Exam Blood pressure 136/77, pulse 68, height 5\' 2"  (1.575 m), weight 178 lb (80.7 kg). BP 136/77   Pulse 68   Ht 5\' 2"  (1.575 m)   Wt 178 lb (80.7 kg)   BMI 32.56 kg/m  CONSTITUTIONAL: Well-developed, well-nourished female in no acute distress.  HENT:  Normocephalic, atraumatic EYES: Conjunctivae and EOM are normal. No scleral icterus.  NECK: Normal range of motion SKIN: Skin is warm and dry. No rash noted. Not diaphoretic.No pallor. [place]: Alert and oriented to person, place, and time. Normal coordination.  Abd: Soft, nontender and nondistended Pelvic: Normal appearing external genitalia; normal appearing vaginal mucosa and cervix. The uterus is prolapsed to introitus with valsalva (I did not have pt stand).  The bladder is also prolapsed to the introitus.  Normal discharge.  Small uterus, no other palpable masses, no uterine or adnexal tenderness  A flat ring pessary was removed and cleaned and replaced without difficulty.  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  Pt wants to continue the use of he pessary. Pessary check     Plan:     F/u in 4 months to have pessary removed and cleaned F/u sooner prn  Need records from prior GYN in [place], [place]  Total face-to-face time with patient was 20 min.  Greater than 50% was spent in counseling and coordination of care with the patient. We discussed see above.  Jezreel Justiniano L. Harraway-Smith, M.D., Cherlynn June

## 2016-05-04 NOTE — Patient Instructions (Signed)
Pelvic Organ Prolapse Introduction Pelvic organ prolapse is the stretching, bulging, or dropping of pelvic organs into an abnormal position. It happens when the muscles and tissues that surround and support pelvic structures are stretched or weak. Pelvic organ prolapse can involve:  Vagina (vaginal prolapse).  Uterus (uterine prolapse).  Bladder (cystocele).  Rectum (rectocele).  Intestines (enterocele). When organs other than the vagina are involved, they often bulge into the vagina or protrude from the vagina, depending on how severe the prolapse is. What are the causes? Causes of this condition include:  Pregnancy, labor, and childbirth.  Long-lasting (chronic) cough.  Chronic constipation.  Obesity.  Past pelvic surgery.  Aging. During and after menopause, a decreased production of the hormone estrogen can weaken pelvic ligaments and muscles.  Consistently lifting more than 50 lb (23 kg).  Buildup of fluid in the abdomen due to certain diseases and other conditions. What are the signs or symptoms? Symptoms of this condition include:  Loss of bladder control when you cough, sneeze, strain, and exercise (stress incontinence). This may be worse immediately following childbirth, and it may gradually improve over time.  Feeling pressure in your pelvis or vagina. This pressure may increase when you cough or when you are having a bowel movement.  A bulge that protrudes from the opening of your vagina or against your vaginal wall. If your uterus protrudes through the opening of your vagina and rubs against your clothing, you may also experience soreness, ulcers, infection, pain, and bleeding.  Increased effort to have a bowel movement or urinate.  Pain in your low back.  Pain, discomfort, or disinterest in sexual intercourse.  Repeated bladder infections (urinary tract infections).  Difficulty inserting or inability to insert a tampon or applicator. In some people, this  condition does not cause any symptoms. How is this diagnosed? Your health care provider may perform an internal and external vaginal and rectal exam. During the exam, you may be asked to cough and strain while you are lying down, sitting, and standing up. Your health care provider will determine if other tests are required, such as bladder function tests. How is this treated? In most cases, this condition needs to be treated only if it produces symptoms. No treatment is guaranteed to correct the prolapse or relieve the symptoms completely. Treatment may include:  Lifestyle changes, such as:  Avoiding drinking beverages that contain caffeine.  Increasing your intake of high-fiber foods. This can help to decrease constipation and straining during bowel movements.  Emptying your bladder at scheduled times (bladder training therapy). This can help to reduce or avoid urinary incontinence.  Losing weight if you are overweight or obese.  Estrogen. Estrogen may help mild prolapse by increasing the strength and tone of pelvic floor muscles.  Kegel exercises. These may help mild cases of prolapse by strengthening and tightening the muscles of the pelvic floor.  Pessary insertion. A pessary is a soft, flexible device that is placed into your vagina by your health care provider to help support the vaginal walls and keep pelvic organs in place.  Surgery. This is often the only form of treatment for severe prolapse. Different types of surgeries are available. Follow these instructions at home:  Wear a sanitary pad or absorbent product if you have urinary incontinence.  Avoid heavy lifting and straining with exercise and work. Do not hold your breath when you perform mild to moderate lifting and exercise activities. Limit your activities as directed by your health care provider.  Take   medicines only as directed by your health care provider.  Perform Kegel exercises as directed by your health care  provider.  If you have a pessary, take care of it as directed by your health care provider. Contact a health care provider if:  Your symptoms interfere with your daily activities or sex life.  You need medicine to help with the discomfort.  You notice bleeding from the vagina that is not related to your period.  You have a fever.  You have pain or bleeding when you urinate.  You have bleeding when you have a bowel movement.  You lose urine when you have sex.  You have chronic constipation.  You have a pessary that falls out.  You have vaginal discharge that has a bad smell.  You have low abdominal pain or cramping that is unusual for you. This information is not intended to replace advice given to you by your health care provider. Make sure you discuss any questions you have with your health care provider. Document Released: 12/13/2013 Document Revised: 10/24/2015 Document Reviewed: 07/31/2013  2017 Elsevier  

## 2016-05-11 ENCOUNTER — Telehealth: Payer: Self-pay | Admitting: Family

## 2016-05-11 MED ORDER — GABAPENTIN 300 MG PO CAPS: 300.0000 mg | ORAL_CAPSULE | Freq: Three times a day (TID) | ORAL | 0 refills | Status: DC

## 2016-05-11 NOTE — Telephone Encounter (Signed)
90 day supply sent to pharmacy. Please call pt and scheduled a 3 month f/u for her with Melissa around 06/13/16. Thanks!

## 2016-05-11 NOTE — Telephone Encounter (Signed)
Patient informed, scheduled for 06/09/2016 at 1:30 with PCP

## 2016-05-11 NOTE — Telephone Encounter (Signed)
Caller name: Relationship to patient: Self Can be reached: [phone]  Pharmacy:  [facility] [place], [address] AT [address] [phone] (Phone) [phone] (Fax)     Reason for call: Refill on gabapentin (NEURONTIN) 300 MG capsule [id number]

## 2016-05-19 ENCOUNTER — Ambulatory Visit: Payer: Medicare Other | Admitting: Podiatry

## 2016-05-27 ENCOUNTER — Other Ambulatory Visit: Payer: Self-pay | Admitting: Family

## 2016-05-29 ENCOUNTER — Telehealth: Payer: Self-pay | Admitting: Family

## 2016-05-29 NOTE — Telephone Encounter (Signed)
error:[id number] ° °

## 2016-06-09 ENCOUNTER — Ambulatory Visit: Payer: No Typology Code available for payment source | Admitting: Family

## 2016-06-09 ENCOUNTER — Ambulatory Visit (INDEPENDENT_AMBULATORY_CARE_PROVIDER_SITE_OTHER): Payer: Medicare Other | Admitting: Podiatry

## 2016-06-09 ENCOUNTER — Encounter: Payer: Self-pay | Admitting: Podiatry

## 2016-06-09 DIAGNOSIS — Q828 Other specified congenital malformations of skin: Secondary | ICD-10-CM

## 2016-06-09 DIAGNOSIS — R52 Pain, unspecified: Secondary | ICD-10-CM | POA: Diagnosis not present

## 2016-06-09 NOTE — Progress Notes (Signed)
Subjective: Patient presents the office today for painful calluses to both of her feet which are painful with pressure and shoes. Denies any swelling or redness.  Denies any systemic complaints such as fevers, chills, nausea, vomiting. No acute changes since last appointment, and no other complaints at this time.   Objective: AAO x3, NAD DP/PT pulses palpable bilaterally, CRT less than 3 seconds There is annular, deep hyperkeratotic lesions present submetatarsal bilaterally, fifth metatarsal base laterally bilaterally as well as midfoot plantarly. Upon debridement there is no underlying ulceration, drainage or any signs of infection. There is tenderness to the hyperkeratotic lesions but no other areas of tenderness. No pinpoint area of tenderness.  No open lesions or pre-ulcerative lesions.  No pain with calf compression, swelling, warmth, erythema  Assessment: Porokeratosis bilaterally  Plan: -All treatment options discussed with the patient including all alternatives, risks, complications.  -Hyperkeratotic is debrided 6 without complications or bleeding.  -Follow-up in 6 weeks or sooner if needed.  -Patient encouraged to call the office with any questions, concerns, change in symptoms.   Celesta Gentile, DPM

## 2016-06-12 ENCOUNTER — Ambulatory Visit: Payer: No Typology Code available for payment source | Admitting: Family

## 2016-06-22 ENCOUNTER — Telehealth: Payer: Self-pay | Admitting: Family

## 2016-06-22 NOTE — Telephone Encounter (Signed)
Patient request to transfer her care from Debbrah Alar to Dr. [name].

## 2016-06-22 NOTE — Telephone Encounter (Signed)
ok 

## 2016-07-01 ENCOUNTER — Other Ambulatory Visit: Payer: Self-pay | Admitting: Family

## 2016-07-01 MED ORDER — METOPROLOL SUCCINATE ER 25 MG PO TB24: 25.0000 mg | ORAL_TABLET | Freq: Every day | ORAL | 0 refills | Status: DC

## 2016-07-01 NOTE — Telephone Encounter (Signed)
Patient is requesting a refill of metoprolol succinate (TOPROL-XL) 25 MG 24 hr tablet Please advise   Pharmacy: [place], [address] AT [facility] OF [address]

## 2016-07-23 ENCOUNTER — Other Ambulatory Visit: Payer: Self-pay | Admitting: Family

## 2016-07-29 ENCOUNTER — Other Ambulatory Visit: Payer: Self-pay | Admitting: Family

## 2016-07-30 ENCOUNTER — Other Ambulatory Visit: Payer: Self-pay | Admitting: Family

## 2016-08-05 ENCOUNTER — Telehealth: Payer: Self-pay

## 2016-08-05 NOTE — Telephone Encounter (Signed)
Pre visit call completed 

## 2016-08-06 ENCOUNTER — Ambulatory Visit (INDEPENDENT_AMBULATORY_CARE_PROVIDER_SITE_OTHER): Payer: Medicare Other | Admitting: Family Medicine

## 2016-08-06 ENCOUNTER — Encounter: Payer: Self-pay | Admitting: Family Medicine

## 2016-08-06 VITALS — BP 122/74 | HR 64 | Temp 98.1°F | Ht 62.0 in | Wt 176.8 lb

## 2016-08-06 DIAGNOSIS — Z131 Encounter for screening for diabetes mellitus: Secondary | ICD-10-CM | POA: Diagnosis not present

## 2016-08-06 DIAGNOSIS — I10 Essential (primary) hypertension: Secondary | ICD-10-CM

## 2016-08-06 DIAGNOSIS — G6289 Other specified polyneuropathies: Secondary | ICD-10-CM | POA: Diagnosis not present

## 2016-08-06 DIAGNOSIS — Z5181 Encounter for therapeutic drug level monitoring: Secondary | ICD-10-CM | POA: Diagnosis not present

## 2016-08-06 DIAGNOSIS — Z13220 Encounter for screening for lipoid disorders: Secondary | ICD-10-CM

## 2016-08-06 DIAGNOSIS — N289 Disorder of kidney and ureter, unspecified: Secondary | ICD-10-CM

## 2016-08-06 DIAGNOSIS — E663 Overweight: Secondary | ICD-10-CM | POA: Diagnosis not present

## 2016-08-06 DIAGNOSIS — Z130 Encounter for screening for diseases of the blood and blood-forming organs and certain disorders involving the immune mechanism: Secondary | ICD-10-CM

## 2016-08-06 LAB — CBC
HEMATOCRIT: 41.1 % (ref 36.0–46.0)
Hemoglobin: 13.7 g/dL (ref 12.0–15.0)
MCHC: 33.3 g/dL (ref 30.0–36.0)
MCV: 93.2 fl (ref 78.0–100.0)
Platelets: 237 10*3/uL (ref 150.0–400.0)
RBC: 4.41 Mil/uL (ref 3.87–5.11)
RDW: 14.6 % (ref 11.5–15.5)
WBC: 6.8 10*3/uL (ref 4.0–10.5)

## 2016-08-06 LAB — COMPREHENSIVE METABOLIC PANEL
ALT: 14 U/L (ref 0–35)
AST: 20 U/L (ref 0–37)
Albumin: 4.1 g/dL (ref 3.5–5.2)
Alkaline Phosphatase: 68 U/L (ref 39–117)
BUN: 36 mg/dL — AB (ref 6–23)
CHLORIDE: 101 meq/L (ref 96–112)
CO2: 32 meq/L (ref 19–32)
Calcium: 10.3 mg/dL (ref 8.4–10.5)
Creatinine, Ser: 1.3 mg/dL — ABNORMAL HIGH (ref 0.40–1.20)
GFR: 42.33 mL/min — AB (ref >60.00)
GLUCOSE: 96 mg/dL (ref 70–99)
POTASSIUM: 3.9 meq/L (ref 3.5–5.1)
SODIUM: 140 meq/L (ref 135–145)
TOTAL PROTEIN: 7.1 g/dL (ref 6.0–8.3)
Total Bilirubin: 0.4 mg/dL (ref 0.2–1.2)

## 2016-08-06 LAB — LIPID PANEL
CHOL/HDL RATIO: 3
Cholesterol: 203 mg/dL — ABNORMAL HIGH (ref 0–200)
HDL: 66 mg/dL (ref >39.00)
LDL CALC: 109 mg/dL — AB (ref 0–99)
NONHDL: 137.2
Triglycerides: 141 mg/dL (ref 0.0–149.0)
VLDL: 28.2 mg/dL (ref 0.0–40.0)

## 2016-08-06 MED ORDER — GABAPENTIN 300 MG PO CAPS: ORAL_CAPSULE | ORAL | 3 refills | Status: DC

## 2016-08-06 MED ORDER — TRIAMTERENE-HCTZ 37.5-25 MG PO TABS: 1.0000 | ORAL_TABLET | Freq: Every day | ORAL | 3 refills | Status: DC

## 2016-08-06 NOTE — Addendum Note (Signed)
Addended by: Lamar Blinks C on: 08/06/2016 08:55 PM   Modules accepted: Orders

## 2016-08-06 NOTE — Patient Instructions (Addendum)
It was good to see you today- I'll be in touch with your labs and we can plan your next visit Try increasing the Neurontin to 2 pills at bedtime; this may be helpful with your neuropathy symptoms   We will plan your next visit pending your labs but probably will be 6 months

## 2016-08-06 NOTE — Progress Notes (Addendum)
[place] at [facility] [address], [place], [place] [address] [phone]  Date:  08/06/2016   Name:  Margaret [name]   DOB:  March 13, 1941   MRN:  [id number]  PCP:  Nance Pear., NP    Chief Complaint: Transfer (Former pt of Margaret Alar, NP. Here to est. )   History of Present Illness:  Margaret [name] is a 76 y.o. very pleasant female patient who presents with the following:  Here today to transfer care to this provider, from [facility]. Last seen here in October of last year History of insomnia, HTN, renal insuf, GERD.   BP Readings from Last 3 Encounters:  08/06/16 122/74  05/04/16 136/77  03/17/16 115/78   Her BP is under good control Most recent BMP in October of last year-  She ate a 1/2 bagel so far today  She uses gabapentin for neuropathy of her feet- she notes burning in her feet at night, and a sensation of "creaping and crawling" in her feet.   She takes 300 mg TID- this does help her some, but she still has sx esp at night  Needs medication refills today Would like to check her cholestetol   Patient Active Problem List   Diagnosis Date Noted  . Stress fracture 01/01/2016  . Insomnia 12/16/2015  . Peripheral neuropathy ([place]) 10/18/2015  . Renal insufficiency 03/06/2015  . HTN (hypertension) 03/06/2015  . Osteopenia 03/06/2015  . GERD (gastroesophageal reflux disease) 03/06/2015  . Vaginal pessary present 03/06/2015    Past Medical History:  Diagnosis Date  . Arthritis   . History of healed stress fracture 2013   bilateral feet  . Hx of phlebitis    reports remote hx of "superfical blood clots" never anticoagulated  . Hypertension   . Osteopenia     Past Surgical History:  Procedure Laterality Date  . FOOT SURGERY     patient reports four foot surgeries  . FOOT SURGERY Left    bunion, hammer toe surgery  . FOOT SURGERY Right    shaved bunion, hammer toe correction   . KNEE ARTHROSCOPY Right 1997  . REPLACEMENT TOTAL KNEE Left   . 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 RETINAL DETACHMENT SURGERY    . RETINAL DETACHMENT SURGERY Left 2008  . THYROIDECTOMY, PARTIAL Right   . THYROIDECTOMY, PARTIAL  2005    Social History  Substance Use Topics  . Smoking status: Former Smoker    Quit date: 06/02/1979  . Smokeless tobacco: Never Used  . Alcohol use 0.6 oz/week    1 Glasses of wine per week     Comment: Occ    Family History  Problem Relation Age of Onset  . Heart disease Mother   . Heart attack Mother   . Heart disease Father   . Diabetes Maternal Grandmother   . Cancer Paternal Grandfather     stomach    Allergies  Allergen Reactions  . Percocet [Oxycodone-Acetaminophen] Other (See Comments)    Pt. reports that the medication causes her to be light headed and pass out.  Marland Kitchen Percocet [Oxycodone-Acetaminophen] Nausea And Vomiting and Other (See Comments)    Passed out    Medication list has been reviewed and updated.  Current Outpatient Prescriptions on File Prior to Visit  Medication Sig Dispense Refill  . Calcium Carb-Cholecalciferol (CALCIUM 600 + D) 600-200 MG-UNIT TABS Take 1 tablet by mouth 2 (two) times daily.    . metoprolol  succinate (TOPROL-XL) 25 MG 24 hr tablet Take 1 tablet (25 mg total) by mouth daily. 30 tablet 0  . naproxen sodium (ANAPROX) 220 MG tablet Take 220 mg by mouth 2 (two) times daily with a meal.    . NONFORMULARY OR COMPOUNDED ITEM [facility]:  Perpheral Neuropathy Cream - Bupivacaine 1%, Doxepin 3%, Gabapentin 6%, Pentoxifylline 3%, Topiramate 1%, apply 1-2 grams to affected area 3-4 times daily. 120 each 2  . omeprazole (PRILOSEC) 20 MG capsule TAKE 1 CAPSULE BY MOUTH EVERY DAY AS NEEDED 90 capsule 0   No current facility-administered medications on file prior to visit.     Review of Systems:  As per HPI- otherwise negative.   Physical Examination: Vitals:   08/06/16 1318  BP: 122/74  Pulse:  64  Temp: 98.1 F (36.7 C)   Vitals:   08/06/16 1318  Weight: 176 lb 12.8 oz (80.2 kg)  Height: 5\' 2"  (1.575 m)   Body mass index is 32.34 kg/m. Ideal Body Weight: Weight in (lb) to have BMI = 25: 136.4  GEN: WDWN, NAD, Non-toxic, A & O x 3, well appearing, overweight HEENT: Atraumatic, Normocephalic. Neck supple. No masses, No LAD. Ears and Nose: No external deformity. CV: RRR, No M/G/R. No JVD. No thrill. No extra heart sounds. PULM: CTA B, no wheezes, crackles, rhonchi. No retractions. No resp. distress. No accessory muscle use. EXTR: No c/c/e NEURO Normal gait.  PSYCH: Normally interactive. Conversant. Not depressed or anxious appearing.  Calm demeanor.    Assessment and Plan:  Other polyneuropathy ([place]) - Plan: gabapentin (NEURONTIN) 300 MG capsule  Hypertension, unspecified type - Plan: triamterene-hydrochlorothiazide (MAXZIDE-25) 37.5-25 MG tablet, Comprehensive metabolic panel  Screening for diabetes mellitus - Plan: Comprehensive metabolic panel  Screening for deficiency anemia - Plan: CBC  Screening for hyperlipidemia - Plan: Lipid panel  Medication monitoring encounter - Plan: CBC  Overweight - Plan: Lipid panel  Here today to establish care with me and go over a few concerns Labs pending as above Her BP is controlled- will refill her medication She is on Neurontin 300 TID- helps some but she still has sx.  Will increase to 4 pills total per day (1200 mg)- if not helpful she will decrease her dose again  Signed Lamar Blinks, MD  Received her labs  Results for orders placed or performed in visit on 08/06/16  CBC  Result Value Ref Range   WBC 6.8 4.0 - 10.5 K/uL   RBC 4.41 3.87 - 5.11 Mil/uL   Platelets 237.0 150.0 - 400.0 K/uL   Hemoglobin 13.7 12.0 - 15.0 g/dL   HCT 41.1 36.0 - 46.0 %   MCV 93.2 78.0 - 100.0 fl   MCHC 33.3 30.0 - 36.0 g/dL   RDW 14.6 11.5 - 15.5 %  Comprehensive metabolic panel  Result Value Ref Range   Sodium 140 135 - 145  mEq/L   Potassium 3.9 3.5 - 5.1 mEq/L   Chloride 101 96 - 112 mEq/L   CO2 32 19 - 32 mEq/L   Glucose, Bld 96 70 - 99 mg/dL   BUN 36 (H) 6 - 23 mg/dL   Creatinine, Ser 1.30 (H) 0.40 - 1.20 mg/dL   Total Bilirubin 0.4 0.2 - 1.2 mg/dL   Alkaline Phosphatase 68 39 - 117 U/L   AST 20 0 - 37 U/L   ALT 14 0 - 35 U/L   Total Protein 7.1 6.0 - 8.3 g/dL   Albumin 4.1 3.5 - 5.2 g/dL  Calcium 10.3 8.4 - 10.5 mg/dL   GFR 42.33 (L) >60.00 mL/min  Lipid panel  Result Value Ref Range   Cholesterol 203 (H) 0 - 200 mg/dL   Triglycerides 141.0 0.0 - 149.0 mg/dL   HDL 66.00 >39.00 mg/dL   VLDL 28.2 0.0 - 40.0 mg/dL   LDL Cholesterol 109 (H) 0 - 99 mg/dL   Total CHOL/HDL Ratio 3    NonHDL 137.20    Letter to pt Your cholesterol overall looks quite good. Your blood count is normal.   Your kidney function is not quite as good as it has been- however, this may be just some variation and is not near a dangerous level.  Please come by for a lab visit only in about one month and we will repeat this.  If your renal function is still a bit low I would like to change your blood pressure medication.  Take care!

## 2016-08-11 ENCOUNTER — Ambulatory Visit (INDEPENDENT_AMBULATORY_CARE_PROVIDER_SITE_OTHER): Payer: Medicare Other | Admitting: Podiatry

## 2016-08-11 ENCOUNTER — Encounter: Payer: Self-pay | Admitting: Podiatry

## 2016-08-11 DIAGNOSIS — Q828 Other specified congenital malformations of skin: Secondary | ICD-10-CM | POA: Diagnosis not present

## 2016-08-11 DIAGNOSIS — M79675 Pain in left toe(s): Secondary | ICD-10-CM

## 2016-08-11 DIAGNOSIS — M79674 Pain in right toe(s): Secondary | ICD-10-CM

## 2016-08-11 DIAGNOSIS — G629 Polyneuropathy, unspecified: Secondary | ICD-10-CM | POA: Diagnosis not present

## 2016-08-11 DIAGNOSIS — B351 Tinea unguium: Secondary | ICD-10-CM | POA: Diagnosis not present

## 2016-08-12 NOTE — Progress Notes (Signed)
Subjective: Patient presents the office today for painful calluses and toenails to both of her feet which are painful with pressure and shoes. Denies any swelling or redness.  Denies any systemic complaints such as fevers, chills, nausea, vomiting. No acute changes since last appointment, and no other complaints at this time.   Objective: AAO x3, NAD DP/PT pulses palpable bilaterally, CRT less than 3 seconds Sensation decreased with SWMF.  Nails are hypertrophic, dystrophic, brittle, discolored, elongated 10. No surrounding redness or drainage. Tenderness nails 1-5 bilaterally. There is annular, deep hyperkeratotic lesions present submetatarsal bilaterally, fifth metatarsal base laterally bilaterally as well as midfoot plantarly. Upon debridement there is no underlying ulceration, drainage or any signs of infection. There is tenderness to the hyperkeratotic lesions but no other areas of tenderness. No pinpoint area of tenderness.  No open lesions or pre-ulcerative lesions.  No pain with calf compression, swelling, warmth, erythema  Assessment: Porokeratosis bilaterally, symptomatic onychomycosis   Plan: -All treatment options discussed with the patient including all alternatives, risks, complications.  -Hyperkeratotic is debrided 6 without complications or bleeding.  Nails sharply debrided x 10 without any complications or bleeding.  -Follow-up in 6 weeks or sooner if needed.  -Patient encouraged to call the office with any questions, concerns, change in symptoms.   Celesta Gentile, DPM

## 2016-08-27 ENCOUNTER — Telehealth: Payer: Self-pay | Admitting: Family Medicine

## 2016-08-27 NOTE — Telephone Encounter (Signed)
Caller name: Kaziah Krizek Relationship to patient: self Can be reached: [phone] Pharmacy: [facility], [address] AT [address]  Reason for call: Pt called stating husband in hospital in critical conditional and she is having a lot of anxiety. She is requesting a call from Dr. [name] asap.

## 2016-08-28 NOTE — Telephone Encounter (Signed)
Called pt yesterday evening and today- have not connected with her as of yet.  LMOM twice

## 2016-08-29 NOTE — Telephone Encounter (Signed)
Called and LMOM- asked her to let me know if I can do anything to help

## 2016-08-30 ENCOUNTER — Other Ambulatory Visit: Payer: Self-pay | Admitting: Family

## 2016-08-31 MED ORDER — ALPRAZOLAM 0.5 MG PO TABS: ORAL_TABLET | ORAL | 0 refills | Status: DC

## 2016-08-31 NOTE — Telephone Encounter (Signed)
Called her back- her husband is at [facility] with heart and kidney issues.   She notes that she is not sleeping and is feeling very anxious. She has used [place] in the past for insomnia- Lorrin Mais did help her.  However, as her husband is so ill we don't want her on something so long acting in case of an emergency.  Decided to use xanax prn for her- will rx to her drug store  She will let me know if not helpful for her Cautioned about sedation/ do not drive if on medication  Meds ordered this encounter  Medications  . ALPRAZolam (XANAX) 0.5 MG tablet    Sig: Take 1/2 or 1 every 8 hours as needed for anxiety or sleep    Dispense:  30 tablet    Refill:  0

## 2016-08-31 NOTE — Telephone Encounter (Signed)
Pt called in to reach Dr. [name]. Pt stating that in the hospital phone not ringing. Apologized for not getting calls. Pt currently at home. She said to please try her at home # [phone]. If no answer or you get answering machine please try cell # [phone].

## 2016-09-08 ENCOUNTER — Other Ambulatory Visit (INDEPENDENT_AMBULATORY_CARE_PROVIDER_SITE_OTHER): Payer: Medicare Other

## 2016-09-08 ENCOUNTER — Ambulatory Visit (INDEPENDENT_AMBULATORY_CARE_PROVIDER_SITE_OTHER): Payer: Medicare Other | Admitting: Podiatry

## 2016-09-08 ENCOUNTER — Encounter: Payer: Self-pay | Admitting: Podiatry

## 2016-09-08 DIAGNOSIS — M79673 Pain in unspecified foot: Secondary | ICD-10-CM

## 2016-09-08 DIAGNOSIS — N289 Disorder of kidney and ureter, unspecified: Secondary | ICD-10-CM | POA: Diagnosis not present

## 2016-09-08 DIAGNOSIS — Q828 Other specified congenital malformations of skin: Secondary | ICD-10-CM

## 2016-09-08 DIAGNOSIS — R52 Pain, unspecified: Secondary | ICD-10-CM

## 2016-09-08 LAB — BASIC METABOLIC PANEL
BUN: 25 mg/dL — AB (ref 6–23)
CHLORIDE: 101 meq/L (ref 96–112)
CO2: 32 meq/L (ref 19–32)
Calcium: 9.8 mg/dL (ref 8.4–10.5)
Creatinine, Ser: 1.21 mg/dL — ABNORMAL HIGH (ref 0.40–1.20)
GFR: 45.97 mL/min — ABNORMAL LOW (ref >60.00)
Glucose, Bld: 113 mg/dL — ABNORMAL HIGH (ref 70–99)
POTASSIUM: 3.6 meq/L (ref 3.5–5.1)
Sodium: 140 mEq/L (ref 135–145)

## 2016-09-08 NOTE — Progress Notes (Signed)
Subjective: Margaret [name] presents the office today for concerns of 2 painful calluses on her right foot which started over the last couple weeks. She states the lesions are painful with pressure and shoes. Denies stepping on any foreign objections. No swelling, redness, drainage.  Denies any systemic complaints such as fevers, chills, nausea, vomiting. No acute changes since last appointment, and no other complaints at this time.   Objective: AAO x3, NAD DP/PT pulses palpable bilaterally, CRT less than 3 seconds Thick, annular hyperkeratotic lesions x 2 to the right foot along the plantar medial arch. Upon debridement, no underlying ulceration, drainage, foreign body present and there are no signs of pinpoint bleeding or verruca. Tenderness to the lesions prior to debridement.  No open lesions or pre-ulcerative lesions.  No pain with calf compression, swelling, warmth, erythema  Assessment: Porokertosis right foot  Plan: -All treatment options discussed with the patient including all alternatives, risks, complications.  -Lesions were sharply debrided 2 without complications or bleeding. The area was cleaned and a pad was placed followed by salicylic acid and a bandage. Post procedure instructions were discussed. -RTC in 4 weeks or sooner if needed.  -Patient encouraged to call the office with any questions, concerns, change in symptoms.   Celesta Gentile, DPM

## 2016-09-10 NOTE — Progress Notes (Signed)
Came in to have labs to check on her renal function- improved.  Will follow-up in 2 months. If need be we may lighten her diuretic  Letter to pt  BP Readings from Last 3 Encounters:  08/06/16 122/74  05/04/16 136/77  03/17/16 115/78

## 2016-09-11 ENCOUNTER — Telehealth: Payer: Self-pay | Admitting: Family Medicine

## 2016-09-11 DIAGNOSIS — R350 Frequency of micturition: Secondary | ICD-10-CM | POA: Diagnosis not present

## 2016-09-11 NOTE — Telephone Encounter (Signed)
Caller name: Relationship to patient: Self Can be reached: [phone] Pharmacy:  Reason for call: Patient requesting an order for a UA. Thinks she has a UTI.

## 2016-09-11 NOTE — Telephone Encounter (Signed)
Please give her a call. Can we see her today or set her up in Saturday clinic?  I prefer not to just check urine without actually seeing the patient.  If she insists we can order a UA and culture for her but let her know that I won't get the culture back for probably 2 days

## 2016-09-11 NOTE — Telephone Encounter (Signed)
Spoke to pt who states she just got back from Urgent Care. Per pt she was told by Urgent Care that she does not have a UTI but may actually be dehydrated. Pt states that the only sx's she has today is dark urine. Pt denies any pain or burning.  Pt will follow directions given by Urgent Care for dehydration. Informed pt that if over the weekend sx's worsen or do not improve she can be seen by Urgent Care or ER if needed. Pt verbalized understanding.

## 2016-09-14 ENCOUNTER — Ambulatory Visit (INDEPENDENT_AMBULATORY_CARE_PROVIDER_SITE_OTHER): Payer: Medicare Other | Admitting: Family Medicine

## 2016-09-14 ENCOUNTER — Encounter: Payer: Self-pay | Admitting: Family Medicine

## 2016-09-14 VITALS — BP 132/78 | HR 76 | Temp 98.2°F | Resp 14 | Ht 62.0 in | Wt 168.1 lb

## 2016-09-14 DIAGNOSIS — F419 Anxiety disorder, unspecified: Secondary | ICD-10-CM

## 2016-09-14 DIAGNOSIS — R21 Rash and other nonspecific skin eruption: Secondary | ICD-10-CM | POA: Diagnosis not present

## 2016-09-14 MED ORDER — DIAZEPAM 2 MG PO TABS: ORAL_TABLET | ORAL | 0 refills | Status: DC

## 2016-09-14 NOTE — Patient Instructions (Signed)
Your urine showed just mixed bacteria, no definite infection. You can stop the antibiotic (keflex) at this time Instead of xanax try the diazepam as needed for anxiety.  Remember this can make you sleepy!    If your rash and itching are not getting better in the next couple of days please let me know!    If any lip or tongue swelling or difficulty breathing please seek emergency care

## 2016-09-14 NOTE — Progress Notes (Signed)
Pre visit review using our clinic review tool, if applicable. No additional management support is needed unless otherwise documented below in the visit note. 

## 2016-09-14 NOTE — Progress Notes (Signed)
[place] at [facility] [address] [place], [place] [address] [phone]  Date:  09/14/2016   Name:  Margaret [name]   DOB:  1941-01-28   MRN:  [id number]  PCP:  Lamar Blinks, MD    Chief Complaint: Rash (x2-3 days, has tried benadryl, cortisone, "everywhere", seen at [facility] for UTI given Keflex-reports that rash started prior, wonders if Xanax is cause)   History of Present Illness:  Margaret [name] is a 76 y.o. very pleasant female patient who presents with the following:  Last seen by myself in March She called on 3/29 and I gave her an rx for xanax as she was very stressed regarding her husband's illness.  He has been at [facility] with serious illness.  It sounds like he has end stae CHF; they were considering an LVAD but then decided he was not a candidate.  They plan to release him to home pretty soon; they are not sure of his life expectancy but he will not be on hospice   Today is Monday- this past Thursday or Friday she went to UC due to dark urine; she was given an rx for keflex.   They did do a urine culture and called her with the results today- however she is not sure of what the culture showed Over the last several days she has noted a rash and itching She noted onset of scratching on her skin, and she will develop little bumps on her skin that make her itch This started PRIOR to going on the keflex- at least she thinks so   Patient Active Problem List   Diagnosis Date Noted  . Stress fracture 01/01/2016  . Insomnia 12/16/2015  . Peripheral neuropathy 10/18/2015  . Renal insufficiency 03/06/2015  . HTN (hypertension) 03/06/2015  . Osteopenia 03/06/2015  . GERD (gastroesophageal reflux disease) 03/06/2015  . Vaginal pessary present 03/06/2015    Past Medical History:  Diagnosis Date  . Arthritis   . History of healed stress fracture 2013   bilateral feet  . Hx of phlebitis    reports remote hx of "superfical  blood clots" never anticoagulated  . Hypertension   . Osteopenia     Past Surgical History:  Procedure Laterality Date  . FOOT SURGERY     patient reports four foot surgeries  . FOOT SURGERY Left    bunion, hammer toe surgery  . FOOT SURGERY Right    shaved bunion, hammer toe correction  . KNEE ARTHROSCOPY Right 1997  . REPLACEMENT TOTAL KNEE Left   . 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 RETINAL DETACHMENT SURGERY    . RETINAL DETACHMENT SURGERY Left 2008  . THYROIDECTOMY, PARTIAL Right   . THYROIDECTOMY, PARTIAL  2005    Social History  Substance Use Topics  . Smoking status: Former Smoker    Quit date: 06/02/1979  . Smokeless tobacco: Never Used  . Alcohol use 0.6 oz/week    1 Glasses of wine per week     Comment: Occ    Family History  Problem Relation Age of Onset  . Heart disease Mother   . Heart attack Mother   . Heart disease Father   . Diabetes Maternal Grandmother   . Cancer Paternal Grandfather     stomach    Allergies  Allergen Reactions  . Percocet [Oxycodone-Acetaminophen] Other (See Comments)    Pt. reports that the medication causes her to be light headed and pass out.  Medication list has been reviewed and updated.  Current Outpatient Prescriptions on File Prior to Visit  Medication Sig Dispense Refill  . ALPRAZolam (XANAX) 0.5 MG tablet Take 1/2 or 1 every 8 hours as needed for anxiety or sleep 30 tablet 0  . Calcium Carb-Cholecalciferol (CALCIUM 600 + D) 600-200 MG-UNIT TABS Take 1 tablet by mouth 2 (two) times daily.    Marland Kitchen gabapentin (NEURONTIN) 300 MG capsule Take 1 am, 1 noon and 2 at beddtime 360 capsule 3  . metoprolol succinate (TOPROL-XL) 25 MG 24 hr tablet TAKE 1 TABLET BY MOUTH EVERY DAY 30 tablet 0  . naproxen sodium (ANAPROX) 220 MG tablet Take 220 mg by mouth 2 (two) times daily with a meal.    . NONFORMULARY OR COMPOUNDED ITEM [facility]:  Perpheral Neuropathy Cream - Bupivacaine 1%, Doxepin 3%, Gabapentin 6%,  Pentoxifylline 3%, Topiramate 1%, apply 1-2 grams to affected area 3-4 times daily. 120 each 2  . omeprazole (PRILOSEC) 20 MG capsule TAKE 1 CAPSULE BY MOUTH EVERY DAY AS NEEDED 90 capsule 0  . triamterene-hydrochlorothiazide (MAXZIDE-25) 37.5-25 MG tablet Take 1 tablet by mouth daily. 90 tablet 3   No current facility-administered medications on file prior to visit.     Review of Systems:  As per HPI- otherwise negative. No fever, chills, lip or tongue swelling or SOB   Physical Examination: Vitals:   09/14/16 1359  BP: 132/78  Pulse: 76  Resp: 14  Temp: 98.2 F (36.8 C)   Vitals:   09/14/16 1359  Weight: 168 lb 2 oz (76.3 kg)  Height: 5\' 2"  (1.575 m)   Body mass index is 30.75 kg/m. Ideal Body Weight: Weight in (lb) to have BMI = 25: 136.4  GEN: WDWN, NAD, Non-toxic, A & O x 3, overweight, looks well HEENT: Atraumatic, Normocephalic. Neck supple. No masses, No LAD.  Bilateral TM wnl, oropharynx normal.  PEERL,EOMI.   Ears and Nose: No external deformity. CV: RRR, No M/G/R. No JVD. No thrill. No extra heart sounds. PULM: CTA B, no wheezes, crackles, rhonchi. No retractions. No resp. distress. No accessory muscle use. EXTR: No c/c/e NEURO Normal gait.  PSYCH: Normally interactive. Conversant. Not depressed or anxious appearing.  Calm demeanor.  At this time she does not have a visible rash or hives, but does have a few excoriated areas on her chest, shoulders and upper back.    Called the urgent care that took care of her- her urine grew a low count of mixed bacteria. Can stop abx  Assessment and Plan: Anxiety - Plan: diazepam (VALIUM) 2 MG tablet  Rash - Plan: diazepam (VALIUM) 2 MG tablet  Here today with possible rash related to abx or xanax- however no definite urticaria are observed currently.  May be related to stress Will have her stop the abx as it is no longer needed Stop xanax- will have her try valium PRN instead.  Did not change allergy list as we are  uncertain if this is a medication reaction She will keep me posted about her progress and let me know if she needs anything at all See patient instructions for more details.     Signed Lamar Blinks, MD

## 2016-09-15 ENCOUNTER — Ambulatory Visit: Payer: Medicare Other | Admitting: Podiatry

## 2016-09-16 ENCOUNTER — Telehealth: Payer: Self-pay | Admitting: Internal Medicine

## 2016-09-16 ENCOUNTER — Emergency Department (HOSPITAL_COMMUNITY): Payer: Medicare Other

## 2016-09-16 ENCOUNTER — Ambulatory Visit (INDEPENDENT_AMBULATORY_CARE_PROVIDER_SITE_OTHER): Payer: Medicare Other | Admitting: Internal Medicine

## 2016-09-16 ENCOUNTER — Telehealth: Payer: Self-pay | Admitting: Family Medicine

## 2016-09-16 ENCOUNTER — Telehealth: Payer: Self-pay

## 2016-09-16 ENCOUNTER — Other Ambulatory Visit: Payer: Self-pay | Admitting: Emergency Medicine

## 2016-09-16 ENCOUNTER — Other Ambulatory Visit: Payer: Self-pay | Admitting: Internal Medicine

## 2016-09-16 ENCOUNTER — Telehealth: Payer: Self-pay | Admitting: *Deleted

## 2016-09-16 ENCOUNTER — Ambulatory Visit (HOSPITAL_BASED_OUTPATIENT_CLINIC_OR_DEPARTMENT_OTHER)
Admission: RE | Admit: 2016-09-16 | Discharge: 2016-09-16 | Disposition: A | Discharge status: Home / Self Care | Payer: Medicare Other | Source: Ambulatory Visit | Attending: Internal Medicine | Admitting: Internal Medicine

## 2016-09-16 ENCOUNTER — Encounter (HOSPITAL_COMMUNITY): Payer: Self-pay | Admitting: *Deleted

## 2016-09-16 ENCOUNTER — Inpatient Hospital Stay (HOSPITAL_COMMUNITY)
Admission: EM | Admit: 2016-09-16 | Discharge: 2016-09-20 | DRG: 418 | Disposition: A | Discharge status: Home / Self Care | Payer: Medicare Other | Attending: Internal Medicine | Admitting: Internal Medicine

## 2016-09-16 ENCOUNTER — Encounter: Payer: Self-pay | Admitting: Internal Medicine

## 2016-09-16 VITALS — BP 128/60 | HR 80 | Temp 98.1°F | Resp 14 | Ht 62.0 in | Wt 168.1 lb

## 2016-09-16 DIAGNOSIS — K851 Biliary acute pancreatitis without necrosis or infection: Secondary | ICD-10-CM | POA: Diagnosis not present

## 2016-09-16 DIAGNOSIS — R1010 Upper abdominal pain, unspecified: Secondary | ICD-10-CM | POA: Diagnosis not present

## 2016-09-16 DIAGNOSIS — Z96652 Presence of left artificial knee joint: Secondary | ICD-10-CM | POA: Diagnosis present

## 2016-09-16 DIAGNOSIS — Z791 Long term (current) use of non-steroidal anti-inflammatories (NSAID): Secondary | ICD-10-CM

## 2016-09-16 DIAGNOSIS — K219 Gastro-esophageal reflux disease without esophagitis: Secondary | ICD-10-CM | POA: Diagnosis not present

## 2016-09-16 DIAGNOSIS — K8001 Calculus of gallbladder with acute cholecystitis with obstruction: Secondary | ICD-10-CM

## 2016-09-16 DIAGNOSIS — M1990 Unspecified osteoarthritis, unspecified site: Secondary | ICD-10-CM | POA: Diagnosis present

## 2016-09-16 DIAGNOSIS — R945 Abnormal results of liver function studies: Secondary | ICD-10-CM

## 2016-09-16 DIAGNOSIS — K8065 Calculus of gallbladder and bile duct with chronic cholecystitis with obstruction: Secondary | ICD-10-CM | POA: Diagnosis present

## 2016-09-16 DIAGNOSIS — K5900 Constipation, unspecified: Secondary | ICD-10-CM | POA: Diagnosis not present

## 2016-09-16 DIAGNOSIS — M8580 Other specified disorders of bone density and structure, unspecified site: Secondary | ICD-10-CM | POA: Diagnosis present

## 2016-09-16 DIAGNOSIS — E669 Obesity, unspecified: Secondary | ICD-10-CM | POA: Diagnosis present

## 2016-09-16 DIAGNOSIS — I10 Essential (primary) hypertension: Secondary | ICD-10-CM | POA: Diagnosis present

## 2016-09-16 DIAGNOSIS — Z6830 Body mass index (BMI) 30.0-30.9, adult: Secondary | ICD-10-CM

## 2016-09-16 DIAGNOSIS — R1013 Epigastric pain: Secondary | ICD-10-CM | POA: Diagnosis not present

## 2016-09-16 DIAGNOSIS — F419 Anxiety disorder, unspecified: Secondary | ICD-10-CM

## 2016-09-16 DIAGNOSIS — T7840XA Allergy, unspecified, initial encounter: Secondary | ICD-10-CM

## 2016-09-16 DIAGNOSIS — R21 Rash and other nonspecific skin eruption: Secondary | ICD-10-CM

## 2016-09-16 DIAGNOSIS — D649 Anemia, unspecified: Secondary | ICD-10-CM | POA: Diagnosis present

## 2016-09-16 DIAGNOSIS — E162 Hypoglycemia, unspecified: Secondary | ICD-10-CM | POA: Diagnosis present

## 2016-09-16 DIAGNOSIS — Z87891 Personal history of nicotine dependence: Secondary | ICD-10-CM

## 2016-09-16 DIAGNOSIS — E876 Hypokalemia: Secondary | ICD-10-CM | POA: Diagnosis present

## 2016-09-16 DIAGNOSIS — N179 Acute kidney failure, unspecified: Secondary | ICD-10-CM | POA: Diagnosis not present

## 2016-09-16 DIAGNOSIS — R1011 Right upper quadrant pain: Secondary | ICD-10-CM | POA: Diagnosis not present

## 2016-09-16 DIAGNOSIS — Z885 Allergy status to narcotic agent status: Secondary | ICD-10-CM

## 2016-09-16 DIAGNOSIS — K8050 Calculus of bile duct without cholangitis or cholecystitis without obstruction: Secondary | ICD-10-CM

## 2016-09-16 DIAGNOSIS — R7989 Other specified abnormal findings of blood chemistry: Secondary | ICD-10-CM

## 2016-09-16 LAB — CBC WITH DIFFERENTIAL/PLATELET
BASOS ABS: 0.1 10*3/uL (ref 0.0–0.1)
Basophils Relative: 0.3 % (ref 0.0–3.0)
EOS PCT: 0.1 % (ref 0.0–5.0)
Eosinophils Absolute: 0 10*3/uL (ref 0.0–0.7)
HEMATOCRIT: 41.9 % (ref 36.0–46.0)
HEMOGLOBIN: 14 g/dL (ref 12.0–15.0)
LYMPHS PCT: 6 % — AB (ref 12.0–46.0)
Lymphs Abs: 1.1 10*3/uL (ref 0.7–4.0)
MCHC: 33.4 g/dL (ref 30.0–36.0)
MCV: 93.3 fl (ref 78.0–100.0)
MONOS PCT: 3.5 % (ref 3.0–12.0)
Monocytes Absolute: 0.7 10*3/uL (ref 0.1–1.0)
Neutro Abs: 16.8 10*3/uL — ABNORMAL HIGH (ref 1.4–7.7)
Neutrophils Relative %: 90.1 % — ABNORMAL HIGH (ref 43.0–77.0)
Platelets: 275 10*3/uL (ref 150.0–400.0)
RBC: 4.49 Mil/uL (ref 3.87–5.11)
RDW: 14.7 % (ref 11.5–15.5)
WBC: 18.6 10*3/uL — AB (ref 4.0–10.5)

## 2016-09-16 LAB — COMPREHENSIVE METABOLIC PANEL
ALBUMIN: 3.8 g/dL (ref 3.5–5.2)
ALK PHOS: 187 U/L — AB (ref 39–117)
ALT: 147 U/L — ABNORMAL HIGH (ref 0–35)
AST: 127 U/L — ABNORMAL HIGH (ref 0–37)
BILIRUBIN TOTAL: 5.3 mg/dL — AB (ref 0.2–1.2)
BUN: 19 mg/dL (ref 6–23)
CALCIUM: 9.5 mg/dL (ref 8.4–10.5)
CO2: 28 mEq/L (ref 19–32)
Chloride: 99 mEq/L (ref 96–112)
Creatinine, Ser: 1.23 mg/dL — ABNORMAL HIGH (ref 0.40–1.20)
GFR: 45.11 mL/min — AB (ref >60.00)
Glucose, Bld: 129 mg/dL — ABNORMAL HIGH (ref 70–99)
Potassium: 3.3 mEq/L — ABNORMAL LOW (ref 3.5–5.1)
Sodium: 138 mEq/L (ref 135–145)
TOTAL PROTEIN: 7.2 g/dL (ref 6.0–8.3)

## 2016-09-16 LAB — URINALYSIS, ROUTINE W REFLEX MICROSCOPIC
BILIRUBIN URINE: NEGATIVE
Glucose, UA: NEGATIVE mg/dL
KETONES UR: NEGATIVE mg/dL
Leukocytes, UA: NEGATIVE
NITRITE: NEGATIVE
PH: 5 (ref 5.0–8.0)
Protein, ur: 30 mg/dL — AB
Specific Gravity, Urine: 1.017 (ref 1.005–1.030)

## 2016-09-16 LAB — LIPASE, BLOOD: LIPASE: 3160 U/L — AB (ref 11–51)

## 2016-09-16 MED ORDER — RANITIDINE HCL 300 MG PO TABS: 300.0000 mg | ORAL_TABLET | Freq: Every day | ORAL | 1 refills | Status: DC

## 2016-09-16 NOTE — Telephone Encounter (Signed)
Spoke with the patient again, told her there is a very small possibility that she could be released home tonight depending on the results; she decided to go to [facility], discussed with on of the ER M.D. who agreed with my plan.

## 2016-09-16 NOTE — Patient Instructions (Addendum)
GO TO THE LAB : Get the blood work     GO TO THE FRONT DESK Schedule your next appointment for a  checkup 5 days from today with Dr [name]   Stop by the first floor, get your x-ray  For hives: Claritin 10 mg every day Zantac 300 mg one tablet every night ER if severe symptoms We are referring you to an allergist  For stomach pain: Stop Aleve, Tylenol is okay Prilosec: Increased to 2 tablets every morning before breakfast ER if fever, chills, increased stomach pain, severe nausea, blood in the stools or change in the color of the stools.

## 2016-09-16 NOTE — Telephone Encounter (Signed)
CRITICAL VALUE STICKER  CRITICAL VALUE:WBC:18.6  RECEIVER (on-site recipient of call):Angie  DATE & TIME NOTIFIED:09/16/16 @ 4:37pm  MESSENGER (representative from lab):Jeani Hawking  MD NOTIFIED:Dr. Larose Kells  TIME OF NOTIFICATION:4:39pm  RESPONSE:Dr. Larose Kells asked me to call the [facility] lab to see when the results will be release.  Called [facility] lab and the lab was been released.  Dr. Larose Kells was made aware of the released labs.

## 2016-09-16 NOTE — ED Triage Notes (Signed)
Pt states she went to see her pcp about abdominal pain. He drew labs today and called her at home, telling her she urgently needed to go to the ED, that her gall bladder was inflamed and "she could die from sepsis" if she didn't have the surgery.  Pt in no physical distress presently.

## 2016-09-16 NOTE — Telephone Encounter (Signed)
Relation to OI:PPGF Call back number:[phone] Pharmacy: [facility], [address] AT [facility] [place] [phone] (Phone) [phone] (Fax)     Reason for call:  Pharmacy informed patient 2 Rx reflecting ranitidine (ZANTAC) 300 MG tablet were sent in and patient states it should have been zantac and claritin, patient would like to speak with nurse, please advise

## 2016-09-16 NOTE — Telephone Encounter (Signed)
Results just released: X-rays okay, white cells 18 K, increased alkaline phosphatase and LFTs. She has an acute process going on, probably her gallbladder. I spoke with the patient, I advised her to go to the emergency room now, she is very hesitant because her husband is ill at the hospital but probably will be released to her home tomorrow. I reiterated that if she has an intra-abdominal infection this could be life-threatening and again encouraged her to go to the ER now.

## 2016-09-16 NOTE — ED Provider Notes (Signed)
[facility] DEPT Provider Note   CSN: [id number] Arrival date & time: 09/16/16  1758     History   Chief Complaint Chief Complaint  Patient presents with  . Abdominal Pain    HPI KEYONA EMRICH is a 76 y.o. female.Complains of upper abdominal pain onset 2 AM today waxes and wanes not made worse or better by anything. No nausea or vomiting no fever. Last bowel movement yesterday, normal. No urinary symptoms. Seen by PCP today and had blood work performed . Complete metabolic panel remarkable for elevated LFTs, AST 127 ALT 147 total bilirubin 5.3. CBC remarkable for white cell count 18.6. Was told to come here for further evaluation. Pain presently is moderate. No other associated symptoms.  HPI  Past Medical History:  Diagnosis Date  . Arthritis   . History of healed stress fracture 2013   bilateral feet  . Hx of phlebitis    reports remote hx of "superfical blood clots" never anticoagulated  . Hypertension   . Osteopenia     Patient Active Problem List   Diagnosis Date Noted  . Stress fracture 01/01/2016  . Insomnia 12/16/2015  . Peripheral neuropathy 10/18/2015  . Renal insufficiency 03/06/2015  . HTN (hypertension) 03/06/2015  . Osteopenia 03/06/2015  . GERD (gastroesophageal reflux disease) 03/06/2015  . Vaginal pessary present 03/06/2015    Past Surgical History:  Procedure Laterality Date  . FOOT SURGERY     patient reports four foot surgeries  . FOOT SURGERY Left    bunion, hammer toe surgery  . FOOT SURGERY Right    shaved bunion, hammer toe correction  . KNEE ARTHROSCOPY Right 1997  . REPLACEMENT TOTAL KNEE Left   . 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 RETINAL DETACHMENT SURGERY    . RETINAL DETACHMENT SURGERY Left 2008  . THYROIDECTOMY, PARTIAL Right   . THYROIDECTOMY, PARTIAL  2005    OB History    Gravida Para Term Preterm AB Living   4 3 3  0 1 3   SAB TAB Ectopic Multiple Live Births   1 0 0           Home Medications    Prior  to Admission medications   Medication Sig Start Date End Date Taking? Authorizing Provider  Calcium Carb-Cholecalciferol (CALCIUM 600 + D) 600-200 MG-UNIT TABS Take 1 tablet by mouth 2 (two) times daily.    Historical Provider, MD  diazepam (VALIUM) 2 MG tablet Take 1/2 or 1 twice a day as needed for anxiety Patient not taking: Reported on 09/16/2016 09/14/16   Gay Filler Copland, MD  metoprolol succinate (TOPROL-XL) 25 MG 24 hr tablet TAKE 1 TABLET BY MOUTH EVERY DAY 08/31/16   Debbrah Alar, NP  naproxen sodium (ANAPROX) 220 MG tablet Take 220 mg by mouth 2 (two) times daily with a meal.    Historical Provider, MD  NONFORMULARY OR COMPOUNDED [place]:  Perpheral Neuropathy Cream - Bupivacaine 1%, Doxepin 3%, Gabapentin 6%, Pentoxifylline 3%, Topiramate 1%, apply 1-2 grams to affected area 3-4 times daily. 01/03/16   Trula Slade, DPM  omeprazole (PRILOSEC) 20 MG capsule TAKE 1 CAPSULE BY MOUTH EVERY DAY AS NEEDED 07/24/16   Debbrah Alar, NP  ranitidine (ZANTAC) 300 MG tablet Take 1 tablet (300 mg total) by mouth at bedtime. 09/16/16   Colon Branch, MD  triamterene-hydrochlorothiazide (MAXZIDE-25) 37.5-25 MG tablet Take 1 tablet by mouth daily. 08/06/16   Darreld Mclean, MD    Family History Family History  Problem  Relation Age of Onset  . Heart disease Mother   . Heart attack Mother   . Heart disease Father   . Diabetes Maternal Grandmother   . Cancer Paternal Grandfather     stomach    Social History Social History  Substance Use Topics  . Smoking status: Former Smoker    Quit date: 06/02/1979  . Smokeless tobacco: Never Used  . Alcohol use 0.6 oz/week    1 Glasses of wine per week     Comment: Occ     Allergies   Percocet [oxycodone-acetaminophen]   Review of Systems Review of Systems  Constitutional: Negative.   HENT: Negative.   Respiratory: Negative.   Cardiovascular: Negative.   Gastrointestinal: Positive for abdominal pain.  Musculoskeletal:  Negative.   Skin: Negative.   Neurological: Negative.   Psychiatric/Behavioral: Negative.   All other systems reviewed and are negative.    Physical Exam Updated Vital Signs BP 137/72   Pulse 76   Temp 98.5 F (36.9 C) (Oral)   Resp 16   Ht 5\' 2"  (1.575 m)   Wt 168 lb (76.2 kg)   SpO2 100%   BMI 30.73 kg/m   Physical Exam  Constitutional: She appears well-developed and well-nourished.  HENT:  Head: Normocephalic and atraumatic.  Eyes: Conjunctivae are normal. Pupils are equal, round, and reactive to light.  Neck: Neck supple. No tracheal deviation present. No thyromegaly present.  Cardiovascular: Normal rate and regular rhythm.   No murmur heard. Pulmonary/Chest: Effort normal and breath sounds normal.  Abdominal: Soft. Bowel sounds are normal. She exhibits no distension and no mass. There is tenderness. There is no rebound and no guarding.  Tender right upper quadrant. Negative Murphy sign  Musculoskeletal: Normal range of motion. She exhibits no edema or tenderness.  Neurological: She is alert. Coordination normal.  Skin: Skin is warm and dry. No rash noted.  Psychiatric: She has a normal mood and affect.  Nursing note and vitals reviewed.    ED Treatments / Results  Labs (all labs ordered are listed, but only abnormal results are displayed) Labs Reviewed  LIPASE, BLOOD  URINALYSIS, ROUTINE W REFLEX MICROSCOPIC    EKG  EKG Interpretation None       Radiology Dg Abd 2 Views  Result Date: 09/16/2016 CLINICAL DATA:  Abdominal pain. EXAM: ABDOMEN - 2 VIEW COMPARISON:  No recent. FINDINGS: Soft tissue structures are unremarkable. No bowel distention. Moderate stool volume . No free air. Stool noted throughout the colon. Degenerative changes thoracolumbar spine with scoliosis concave left. Pelvic calcifications consistent phleboliths. Calcified fibroids may be present. Pessary noted. IMPRESSION: Moderate stool volume. Constipation cannot be excluded. No acute  abnormality identified. Electronically Signed   By: Marcello Moores  Register   On: 09/16/2016 15:10    Procedures Procedures (including critical care time)  Medications Ordered in ED Medications - No data to display  Results for orders placed or performed during the hospital encounter of 09/16/16  Lipase, blood  Result Value Ref Range   Lipase 3,160 (H) 11 - 51 U/L  Urinalysis, Routine w reflex microscopic  Result Value Ref Range   Color, Urine AMBER (A) YELLOW   APPearance HAZY (A) CLEAR   Specific Gravity, Urine 1.017 1.005 - 1.030   pH 5.0 5.0 - 8.0   Glucose, UA NEGATIVE NEGATIVE mg/dL   Hgb urine dipstick SMALL (A) NEGATIVE   Bilirubin Urine NEGATIVE NEGATIVE   Ketones, ur NEGATIVE NEGATIVE mg/dL   Protein, ur 30 (A) NEGATIVE mg/dL  Nitrite NEGATIVE NEGATIVE   Leukocytes, UA NEGATIVE NEGATIVE   RBC / HPF 0-5 0 - 5 RBC/hpf   WBC, UA 6-30 0 - 5 WBC/hpf   Bacteria, UA FEW (A) NONE SEEN   Squamous Epithelial / LPF 0-5 (A) NONE SEEN   Mucous PRESENT    US Abdomen Limited  Result Date: 09/16/2016 CLINICAL DATA:  Right upper quadrant abdominal pain and tenderness beginning today. Elevated white count. EXAM: US ABDOMEN LIMITED - RIGHT UPPER QUADRANT COMPARISON:  None. FINDINGS: Gallbladder: Echogenic sludge within the gallbladder. Gallstones up to 1.2 cm. Wall thickening up to 4 mm suggesting cholecystitis. However, the patient did not have a positive Murphy sign. Common bile duct: Diameter: 6 mm.  No definite ductal stone. Liver: Parenchyma appears normal in echogenicity. I think there is mild intrahepatic ductal dilatation. IMPRESSION: Findings worrisome for cholecystitis. Stones and echogenic sludge in the gallbladder. Gallbladder wall thickening. No Murphy sign however. Mild biliary ductal prominence raises the possibility of a common duct stone as well. This was not specifically visualized. Electronically Signed   By: Nelson Chimes M.D.   On: 09/16/2016 23:02   Dg Abd 2 Views  Result  Date: 09/16/2016 CLINICAL DATA:  Abdominal pain. EXAM: ABDOMEN - 2 VIEW COMPARISON:  No recent. FINDINGS: Soft tissue structures are unremarkable. No bowel distention. Moderate stool volume . No free air. Stool noted throughout the colon. Degenerative changes thoracolumbar spine with scoliosis concave left. Pelvic calcifications consistent phleboliths. Calcified fibroids may be present. Pessary noted. IMPRESSION: Moderate stool volume. Constipation cannot be excluded. No acute abnormality identified. Electronically Signed   By: Marcello Moores  Register   On: 09/16/2016 15:10   Initial Impression / Assessment and Plan / ED Course  I have reviewed the triage vital signs and the nursing notes.  Pertinent labs & imaging results that were available during my care of the patient were reviewed by me and considered in my medical decision making (see chart for details).     Dr. Ninfa Linden from general surgery service consulted and will see patient in hospital he requests admission to internal medicine service, as well as consultations to GI service as patient  need ERCP prior to cholecystectomy . I also consulted Dr. Silverio Decamp from gastroenterology who states requests 1 L saline intravenous bolus followed by saline infusion of 150 mL per hour. She may need MRCP tomorrow. After IV hydration it will be determined whether patient needs ERCP. She will see patient in hospital tomorrow. I consulted Dr. Daryll Drown from the hospitalist service who will arrange for admission   Final Clinical Impressions(s) / ED Diagnoses   diagnosis acute gallstone pancreatitis  Final diagnoses:  None    New Prescriptions New Prescriptions   No medications on file     Orlie Dakin, MD 09/17/16 0007

## 2016-09-16 NOTE — Telephone Encounter (Signed)
Spoke w/ Pt, informed that Zantac was sent to pharmacy and that Claritin is an OTC allergy medication. Pt verbalized understanding.

## 2016-09-16 NOTE — Telephone Encounter (Signed)
PA denied, Pt has not tried and failed SSRI/SNRI. PA information to Dr. [name].

## 2016-09-16 NOTE — H&P (Signed)
History and Physical    MADDI COLLAR QIO:[id number] DOB: 01-Mar-1941 DOA: 09/16/2016  PCP: Lamar Blinks, MD  Patient coming from: Home    Chief Complaint: Abdominal pain  HPI: Margaret [name] is a 76 y.o. female with medical history significant of Osteopenia, HTN, arthritis, GERD who presents for abdominal pain.  She reports that this awoke her from sleep at 2am on the day of admission, was mainly epigastric in nature and sharp.  She had a similar episode about a week ago which went away on its own and was not that bad.  Further symptoms include diarrhea on one day this week, chills last week, but no fever.  There is no change to the pain with food.  The pain was 7/10 at its worst and slowly got better on its own before any medications were given.  She notes that the pain is better now and she is eager to be seen by the surgical team.  She has some other symptoms which do not seem associated, which include dry mouth and occasional issues swallowing due to this, recent dark urine for which she was prescribed a cephalosporin, but was told she did not actually have a UTI based on culture and hives related to xanax which she was prescribed due to acute anxiety as her husband has been in the ICU for 4 weeks.   ED Course: Labs revealed a K of 3.3, Cr of 1.23 (baseline), lipase of > 3000, AST of 127, ALT 147, WBC of 18.6 which improved to 12 with fluids.  US of the abdomen showed GB wall thickening and stones/sludge in the gallbladder.  Dr. Rush Farmer was consulted and recommended GI consultation.   Review of Systems: As per HPI otherwise 10 point review of systems negative.    Past Medical History:  Diagnosis Date  . Arthritis   . History of healed stress fracture 2013   bilateral feet  . Hx of phlebitis    reports remote hx of "superfical blood clots" never anticoagulated  . Hypertension   . Osteopenia     Past Surgical History:  Procedure Laterality Date  . FOOT SURGERY     patient reports four foot surgeries  . FOOT SURGERY Left    bunion, hammer toe surgery  . FOOT SURGERY Right    shaved bunion, hammer toe correction  . KNEE ARTHROSCOPY Right 1997  . REPLACEMENT TOTAL KNEE Left   . 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 RETINAL DETACHMENT SURGERY    . RETINAL DETACHMENT SURGERY Left 2008  . THYROIDECTOMY, PARTIAL Right   . THYROIDECTOMY, PARTIAL  2005   Reviewed with patient.   reports that she quit smoking about 37 years ago. She has never used smokeless tobacco. She reports that she drinks about 0.6 oz of alcohol per week . She reports that she does not use drugs.  Allergies  Allergen Reactions  . Percocet [Oxycodone-Acetaminophen] Other (See Comments)    Pt. reports that the medication causes her to be light headed and pass out.   Reviewed with patient.  Family History  Problem Relation Age of Onset  . Heart disease Mother   . Heart attack Mother   . Heart disease Father   . Diabetes Maternal Grandmother   . Cancer Paternal Grandfather     stomach    Prior to Admission medications   Medication Sig Start Date End Date Taking? Authorizing Provider  Calcium Carb-Cholecalciferol (CALCIUM 600 + D) 600-200 MG-UNIT TABS Take 1 tablet by mouth  2 (two) times daily.    Historical Provider, MD  diazepam (VALIUM) 2 MG tablet Take 1/2 or 1 twice a day as needed for anxiety Patient not taking: Reported on 09/16/2016 09/14/16   Gay Filler Copland, MD  metoprolol succinate (TOPROL-XL) 25 MG 24 hr tablet TAKE 1 TABLET BY MOUTH EVERY DAY 08/31/16   Debbrah Alar, NP  naproxen sodium (ANAPROX) 220 MG tablet Take 220 mg by mouth 2 (two) times daily with a meal.    Historical Provider, MD  NONFORMULARY OR COMPOUNDED [place]:  Perpheral Neuropathy Cream - Bupivacaine 1%, Doxepin 3%, Gabapentin 6%, Pentoxifylline 3%, Topiramate 1%, apply 1-2 grams to affected area 3-4 times daily. 01/03/16   Trula Slade, DPM  omeprazole (PRILOSEC) 20 MG capsule  TAKE 1 CAPSULE BY MOUTH EVERY DAY AS NEEDED 07/24/16   Debbrah Alar, NP  ranitidine (ZANTAC) 300 MG tablet Take 1 tablet (300 mg total) by mouth at bedtime. 09/16/16   Colon Branch, MD  triamterene-hydrochlorothiazide (MAXZIDE-25) 37.5-25 MG tablet Take 1 tablet by mouth daily. 08/06/16   Darreld Mclean, MD    Physical Exam: Vitals:   09/17/16 0052 09/17/16 0100 09/17/16 0143 09/17/16 0607  BP: 124/66 135/70 131/60 (!) 121/56  Pulse: 82 84 71 71  Resp: (!) 22  18 18   Temp:   99.4 F (37.4 C) 98.1 F (36.7 C)  TempSrc:    Oral  SpO2: 99% 99% 99% 94%  Weight:      Height:        Constitutional: NAD, calm, comfortable, lying in bed Vitals:   09/17/16 0052 09/17/16 0100 09/17/16 0143 09/17/16 0607  BP: 124/66 135/70 131/60 (!) 121/56  Pulse: 82 84 71 71  Resp: (!) 22  18 18   Temp:   99.4 F (37.4 C) 98.1 F (36.7 C)  TempSrc:    Oral  SpO2: 99% 99% 99% 94%  Weight:      Height:       Eyes: PERRL, lids and conjunctivae normal, anicteric sclerae ENMT: Mucous membranes are moist. Posterior pharynx clear of any exudate or lesions. Neck: normal, supple, no masses Respiratory: clear to auscultation bilaterally, no wheezing, no crackles. Normal respiratory effort.   Cardiovascular: Regular rate and nromal rhythm, no murmurs / rubs / gallops. No extremity edema. 2+ pedal pulses.  Abdomen: + mild tenderness to deep palpation in the epigastrium, +BS, no pain in the RUQ, no distention Musculoskeletal: no clubbing / cyanosis.  Normal muscle tone.  Skin: no rashes, lesions, ulcers.  Neurologic: Grossly intact, moving all extremities.  Psychiatric: Normal judgment and insight. Alert and oriented x 3. Normal mood.   Labs on Admission: I have personally reviewed following labs and imaging studies  CBC:  Recent Labs Lab 09/16/16 1208 09/17/16 0431  WBC 18.6* 12.4*  NEUTROABS 16.8*  --   HGB 14.0 11.7*  HCT 41.9 35.6*  MCV 93.3 92.2  PLT 275.0 903   Basic Metabolic  Panel:  Recent Labs Lab 09/16/16 1208 09/17/16 0431  NA 138 140  K 3.3* 2.8*  CL 99 104  CO2 28 24  GLUCOSE 129* 80  BUN 19 16  CREATININE 1.23* 1.12*  CALCIUM 9.5 8.5*   GFR: Estimated Creatinine Clearance: 40.8 mL/min (A) (by C-G formula based on SCr of 1.12 mg/dL (H)). Liver Function Tests:  Recent Labs Lab 09/16/16 1208 09/17/16 0431  AST 127* 95*  ALT 147* 117*  ALKPHOS 187* 160*  BILITOT 5.3* 5.0*  PROT 7.2 6.0*  ALBUMIN  3.8 2.6*    Recent Labs Lab 09/16/16 2135  LIPASE 3,160*   No results for input(s): AMMONIA in the last 168 hours. Coagulation Profile:  Recent Labs Lab 09/17/16 0431  INR 0.96   Cardiac Enzymes: No results for input(s): CKTOTAL, CKMB, CKMBINDEX, TROPONINI in the last 168 hours. BNP (last 3 results) No results for input(s): PROBNP in the last 8760 hours. HbA1C: No results for input(s): HGBA1C in the last 72 hours. CBG: No results for input(s): GLUCAP in the last 168 hours. Lipid Profile: No results for input(s): CHOL, HDL, LDLCALC, TRIG, CHOLHDL, LDLDIRECT in the last 72 hours. Thyroid Function Tests: No results for input(s): TSH, T4TOTAL, FREET4, T3FREE, THYROIDAB in the last 72 hours. Anemia Panel: No results for input(s): VITAMINB12, FOLATE, FERRITIN, TIBC, IRON, RETICCTPCT in the last 72 hours. Urine analysis:    Component Value Date/Time   COLORURINE AMBER (A) 09/16/2016 2137   APPEARANCEUR HAZY (A) 09/16/2016 2137   LABSPEC 1.017 09/16/2016 2137   PHURINE 5.0 09/16/2016 2137   GLUCOSEU NEGATIVE 09/16/2016 2137   HGBUR SMALL (A) 09/16/2016 2137   BILIRUBINUR NEGATIVE 09/16/2016 2137   BILIRUBINUR negative 10/14/2015 [place] 09/16/2016 2137   PROTEINUR 30 (A) 09/16/2016 2137   UROBILINOGEN negative 10/14/2015 1618   NITRITE NEGATIVE 09/16/2016 2137   LEUKOCYTESUR NEGATIVE 09/16/2016 2137    Radiological Exams on Admission: US Abdomen Limited  Result Date: 09/16/2016 CLINICAL DATA:  Right upper  quadrant abdominal pain and tenderness beginning today. Elevated white count. EXAM: US ABDOMEN LIMITED - RIGHT UPPER QUADRANT COMPARISON:  None. FINDINGS: Gallbladder: Echogenic sludge within the gallbladder. Gallstones up to 1.2 cm. Wall thickening up to 4 mm suggesting cholecystitis. However, the patient did not have a positive Murphy sign. Common bile duct: Diameter: 6 mm.  No definite ductal stone. Liver: Parenchyma appears normal in echogenicity. I think there is mild intrahepatic ductal dilatation. IMPRESSION: Findings worrisome for cholecystitis. Stones and echogenic sludge in the gallbladder. Gallbladder wall thickening. No Murphy sign however. Mild biliary ductal prominence raises the possibility of a common duct stone as well. This was not specifically visualized. Electronically Signed   By: Nelson Chimes M.D.   On: 09/16/2016 23:02   Dg Abd 2 Views  Result Date: 09/16/2016 CLINICAL DATA:  Abdominal pain. EXAM: ABDOMEN - 2 VIEW COMPARISON:  No recent. FINDINGS: Soft tissue structures are unremarkable. No bowel distention. Moderate stool volume . No free air. Stool noted throughout the colon. Degenerative changes thoracolumbar spine with scoliosis concave left. Pelvic calcifications consistent phleboliths. Calcified fibroids may be present. Pessary noted. IMPRESSION: Moderate stool volume. Constipation cannot be excluded. No acute abnormality identified. Electronically Signed   By: Marcello Moores  Register   On: 09/16/2016 15:10      Assessment/Plan Acute pancreatitis due to calculus of common bile duct - Lipase > 3000 - NPO - IVF with NS at 150cc/hr - Monitor for pain, morphine and tramadol for pain - Gen Surg consult reviewed - GI consult today for possible ERCP, concern for impacted stone  Possible cholecystitis - Rocephin per pharmacy - WBC trending down - Gen Surg consult as noted above    HTN (hypertension) - BP well controlled - Continue metoprolol per home dose    Osteopenia -  Continue calcium and vitamin D    GERD (gastroesophageal reflux disease) - Continue PPI and H2 blocker  Anxiety, acute  - Valium PRN - She had an allergic reaction to xanax - hives, started hydroxyzine.  DVT prophylaxis: Lovenox  Code Status: Full  Family Communication: Daughter [name] at bedside Disposition Plan: d/c in 2-3 days Consults called: Gen Surg [place] Admission status: Telemetry, inpatient    Gilles Chiquito MD [facility] Pager [phone]  If 7PM-7AM, please contact night-coverage [email or web address] Password [facility]  09/17/2016, 6:49 AM

## 2016-09-16 NOTE — Telephone Encounter (Signed)
Margaret [name] (KeyRegan Lemming) - [id number] DiazePAM 2MG  tablets Status: PA Request  Created: April 17th, 2018 [phone]  Sent: April 18th, 2018  Awaiting determination

## 2016-09-16 NOTE — Telephone Encounter (Signed)
PA initiated via Covermymeds; KEY: [id number]. Awaiting determination.

## 2016-09-16 NOTE — Progress Notes (Signed)
Subjective:    Patient ID: Margaret [name], female    DOB: 1940-12-18, 76 y.o.   MRN: [id number]  DOS:  09/16/2016 Type of visit - description : Acute visit Interval history: Was seen by PCP 09/14/2016, please see that note. Her chief complaint today is abdominal pain. Symptoms started after dinner last night, immediately after she started burping, "it smelled like the shrimp I just ate". Shortly after started with epigastric pain, some radiation to the left and right. No radiation to the back. She reports that the pain is worse when she turns in bed, cough or sneezes, "like a broken rib". She hurt all night, this morning she woke up, appetite was poor, decided not to eat. The pain is still there but not as severe. Denies fever chills No nausea or vomiting Last BM was today, stools normal without blood. Denies dysuria or gross hematuria She takes Aleve twice a day everyday for long time.    Review of Systems  She was seen with anxiety, sx continue  due to her husband illness and also her son-in-law is a terminal cancer patient. Recently Xanax was discontinued as a precaution (had a rash) and we are waiting for Ativan prior authorization. Recently had a UTI, currently asymptomatic, status post Keflex. She continue with hives, happening every day mostly in the afternoon, it affected different areas >>  back,  arm or chest.  clear quickly on its own. No lip or tongue swelling. No difficulty breathing or wheezing.  Past Medical History:  Diagnosis Date  . Arthritis   . History of healed stress fracture 2013   bilateral feet  . Hx of phlebitis    reports remote hx of "superfical blood clots" never anticoagulated  . Hypertension   . Osteopenia     Past Surgical History:  Procedure Laterality Date  . FOOT SURGERY     patient reports four foot surgeries  . FOOT SURGERY Left    bunion, hammer toe surgery  . FOOT SURGERY Right    shaved bunion, hammer toe correction  . KNEE  ARTHROSCOPY Right 1997  . REPLACEMENT TOTAL KNEE Left   . 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 RETINAL DETACHMENT SURGERY    . RETINAL DETACHMENT SURGERY Left 2008  . THYROIDECTOMY, PARTIAL Right   . THYROIDECTOMY, PARTIAL  2005    Social History   Social History  . Marital status: Married    Spouse name: N/A  . Number of children: N/A  . Years of education: N/A   Occupational History  . Not on file.   Social History Main Topics  . Smoking status: Former Smoker    Quit date: 06/02/1979  . Smokeless tobacco: Never Used  . Alcohol use 0.6 oz/week    1 Glasses of wine per week     Comment: Occ  . Drug use: No  . Sexual activity: Not Currently   Other Topics Concern  . 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 reports that the medication causes her to be light headed and pass out.      Medication List  Accurate as of 09/16/16 11:38 AM. Always use your most recent med list.          CALCIUM 600 + D 600-200 MG-UNIT Tabs Generic drug:  Calcium Carb-Cholecalciferol Take 1 tablet by mouth 2 (two) times daily.   diazepam 2 MG tablet Commonly known as:  VALIUM Take 1/2 or 1 twice a day as needed for anxiety   gabapentin 300 MG capsule Commonly known as:  NEURONTIN Take 1 am, 1 noon and 2 at beddtime   metoprolol succinate 25 MG 24 hr tablet Commonly known as:  TOPROL-XL TAKE 1 TABLET BY MOUTH EVERY DAY   naproxen sodium 220 MG tablet Commonly known as:  ANAPROX Take 220 mg by mouth 2 (two) times daily with a meal.   NONFORMULARY OR COMPOUNDED ITEM [facility]:  Perpheral Neuropathy Cream - Bupivacaine 1%, Doxepin 3%, Gabapentin 6%,  Pentoxifylline 3%, Topiramate 1%, apply 1-2 grams to affected area 3-4 times daily.   omeprazole 20 MG capsule Commonly known as:  PRILOSEC TAKE 1 CAPSULE BY MOUTH EVERY DAY AS NEEDED   triamterene-hydrochlorothiazide 37.5-25 MG tablet Commonly known as:  MAXZIDE-25 Take 1 tablet by mouth daily.          Objective:   Physical Exam BP 128/60 (BP Location: Left Arm, Patient Position: Sitting, Cuff Size: Small)   Pulse 80   Temp 98.1 F (36.7 C) (Oral)   Resp 14   Ht 5\' 2"  (1.575 m)   Wt 168 lb 2 oz (76.3 kg)   SpO2 98%   BMI 30.75 kg/m  General:   Well developed, well nourished . NAD.  HEENT:  Normocephalic . Face symmetric, atraumatic. Not pale or jaundice Lungs:  CTA B Normal respiratory effort, no intercostal retractions, no accessory muscle use. Heart: RRR,  no murmur.  no pretibial edema bilaterally  Abdomen:  Not distended, soft, + epigastric tenderness, but no mass, rebound. Good bowel sounds. No rash on the abdomen (she has a small area of ecchymosis at the right lower quadrant, she denies any injury, she believes is from scratching).  Skin: Not pale. Not jaundice Neurologic:  alert & oriented X3.  Speech normal, gait appropriate for age and unassisted Psych--  Cognition and judgment appear intact.  Cooperative with normal attention span and concentration.  Behavior appropriate. Mildly anxious on my no depressed appearing.    Assessment & Plan:    76 year old female with history of HTN, osteopenia, presents with the following:  Epigastric pain: Started yesterday, no fever chills, last bowel movement today. She is  tender at the epigastric area, takes NSAIDs twice a day long-term. No  previous surgeries. gastritis, PUD? GB? Plan: CBC, CMP, abdominal x-ray. Increase Prilosec two a day, stop NSAIDs, ER if symptoms severe. Reassess in 5 days. Consider [name], CT abd  Anxiety:counseled, SSRI? Declined for now  Hives: On and off despite stopping Xanax  -keflex.  Patient reports this has been a problem on-off  over the years, usually related to anxiety. Recommend Claritin daily, Zantac at night, ER if symptoms severe, recommend an allergist referral. Neuropathy: Today he reports to me that she stopped gabapentin, did not help. RTC 5 days

## 2016-09-16 NOTE — Telephone Encounter (Signed)
Tried calling Pt, busy signal, will try again later.

## 2016-09-16 NOTE — Progress Notes (Signed)
Pre visit review using our clinic review tool, if applicable. No additional management support is needed unless otherwise documented below in the visit note. 

## 2016-09-17 ENCOUNTER — Encounter (HOSPITAL_COMMUNITY): Payer: Self-pay | Admitting: General Practice

## 2016-09-17 DIAGNOSIS — Z9049 Acquired absence of other specified parts of digestive tract: Secondary | ICD-10-CM | POA: Diagnosis not present

## 2016-09-17 DIAGNOSIS — K851 Biliary acute pancreatitis without necrosis or infection: Secondary | ICD-10-CM | POA: Diagnosis present

## 2016-09-17 DIAGNOSIS — E669 Obesity, unspecified: Secondary | ICD-10-CM | POA: Diagnosis present

## 2016-09-17 DIAGNOSIS — R7989 Other specified abnormal findings of blood chemistry: Secondary | ICD-10-CM | POA: Diagnosis not present

## 2016-09-17 DIAGNOSIS — K219 Gastro-esophageal reflux disease without esophagitis: Secondary | ICD-10-CM | POA: Diagnosis not present

## 2016-09-17 DIAGNOSIS — R1010 Upper abdominal pain, unspecified: Secondary | ICD-10-CM | POA: Diagnosis not present

## 2016-09-17 DIAGNOSIS — K8050 Calculus of bile duct without cholangitis or cholecystitis without obstruction: Secondary | ICD-10-CM | POA: Diagnosis not present

## 2016-09-17 DIAGNOSIS — F419 Anxiety disorder, unspecified: Secondary | ICD-10-CM | POA: Diagnosis present

## 2016-09-17 DIAGNOSIS — K8065 Calculus of gallbladder and bile duct with chronic cholecystitis with obstruction: Secondary | ICD-10-CM | POA: Diagnosis present

## 2016-09-17 DIAGNOSIS — K811 Chronic cholecystitis: Secondary | ICD-10-CM | POA: Diagnosis not present

## 2016-09-17 DIAGNOSIS — I10 Essential (primary) hypertension: Secondary | ICD-10-CM | POA: Diagnosis not present

## 2016-09-17 DIAGNOSIS — E876 Hypokalemia: Secondary | ICD-10-CM | POA: Diagnosis present

## 2016-09-17 DIAGNOSIS — N179 Acute kidney failure, unspecified: Secondary | ICD-10-CM | POA: Diagnosis present

## 2016-09-17 DIAGNOSIS — Z791 Long term (current) use of non-steroidal anti-inflammatories (NSAID): Secondary | ICD-10-CM | POA: Diagnosis not present

## 2016-09-17 DIAGNOSIS — Z885 Allergy status to narcotic agent status: Secondary | ICD-10-CM | POA: Diagnosis not present

## 2016-09-17 DIAGNOSIS — E162 Hypoglycemia, unspecified: Secondary | ICD-10-CM | POA: Diagnosis not present

## 2016-09-17 DIAGNOSIS — K8001 Calculus of gallbladder with acute cholecystitis with obstruction: Secondary | ICD-10-CM | POA: Diagnosis not present

## 2016-09-17 DIAGNOSIS — Z87891 Personal history of nicotine dependence: Secondary | ICD-10-CM | POA: Diagnosis not present

## 2016-09-17 DIAGNOSIS — K8060 Calculus of gallbladder and bile duct with cholecystitis, unspecified, without obstruction: Secondary | ICD-10-CM | POA: Diagnosis not present

## 2016-09-17 DIAGNOSIS — R945 Abnormal results of liver function studies: Secondary | ICD-10-CM

## 2016-09-17 DIAGNOSIS — K810 Acute cholecystitis: Secondary | ICD-10-CM

## 2016-09-17 DIAGNOSIS — G4700 Insomnia, unspecified: Secondary | ICD-10-CM | POA: Diagnosis not present

## 2016-09-17 DIAGNOSIS — Z96652 Presence of left artificial knee joint: Secondary | ICD-10-CM | POA: Diagnosis present

## 2016-09-17 DIAGNOSIS — K8010 Calculus of gallbladder with chronic cholecystitis without obstruction: Secondary | ICD-10-CM | POA: Diagnosis not present

## 2016-09-17 DIAGNOSIS — K859 Acute pancreatitis without necrosis or infection, unspecified: Secondary | ICD-10-CM | POA: Diagnosis not present

## 2016-09-17 DIAGNOSIS — M8580 Other specified disorders of bone density and structure, unspecified site: Secondary | ICD-10-CM | POA: Diagnosis present

## 2016-09-17 DIAGNOSIS — Z6830 Body mass index (BMI) 30.0-30.9, adult: Secondary | ICD-10-CM | POA: Diagnosis not present

## 2016-09-17 DIAGNOSIS — D649 Anemia, unspecified: Secondary | ICD-10-CM | POA: Diagnosis present

## 2016-09-17 DIAGNOSIS — K5900 Constipation, unspecified: Secondary | ICD-10-CM | POA: Diagnosis present

## 2016-09-17 DIAGNOSIS — M1990 Unspecified osteoarthritis, unspecified site: Secondary | ICD-10-CM | POA: Diagnosis present

## 2016-09-17 LAB — CBC
HCT: 35.6 % — ABNORMAL LOW (ref 36.0–46.0)
HCT: 36.4 % (ref 36.0–46.0)
HEMOGLOBIN: 11.7 g/dL — AB (ref 12.0–15.0)
Hemoglobin: 12.1 g/dL (ref 12.0–15.0)
MCH: 30.3 pg (ref 26.0–34.0)
MCH: 30.7 pg (ref 26.0–34.0)
MCHC: 32.9 g/dL (ref 30.0–36.0)
MCHC: 33.2 g/dL (ref 30.0–36.0)
MCV: 92.2 fL (ref 78.0–100.0)
MCV: 92.4 fL (ref 78.0–100.0)
PLATELETS: 241 10*3/uL (ref 150–400)
PLATELETS: 249 10*3/uL (ref 150–400)
RBC: 3.86 MIL/uL — AB (ref 3.87–5.11)
RBC: 3.94 MIL/uL (ref 3.87–5.11)
RDW: 14.8 % (ref 11.5–15.5)
RDW: 14.7 % (ref 11.5–15.5)
WBC: 12.4 10*3/uL — AB (ref 4.0–10.5)
WBC: 12.1 10*3/uL — AB (ref 4.0–10.5)

## 2016-09-17 LAB — COMPREHENSIVE METABOLIC PANEL
ALK PHOS: 160 U/L — AB (ref 38–126)
ALT: 117 U/L — AB (ref 14–54)
AST: 95 U/L — AB (ref 15–41)
Albumin: 2.6 g/dL — ABNORMAL LOW (ref 3.5–5.0)
Anion gap: 12 (ref 5–15)
BILIRUBIN TOTAL: 5 mg/dL — AB (ref 0.3–1.2)
BUN: 16 mg/dL (ref 6–20)
CALCIUM: 8.5 mg/dL — AB (ref 8.9–10.3)
CO2: 24 mmol/L (ref 22–32)
Chloride: 104 mmol/L (ref 101–111)
Creatinine, Ser: 1.12 mg/dL — ABNORMAL HIGH (ref 0.44–1.00)
GFR calc Af Amer: 54 mL/min — ABNORMAL LOW (ref >60)
GFR, EST NON AFRICAN AMERICAN: 46 mL/min — AB (ref >60)
Glucose, Bld: 80 mg/dL (ref 65–99)
Potassium: 2.8 mmol/L — ABNORMAL LOW (ref 3.5–5.1)
Sodium: 140 mmol/L (ref 135–145)
TOTAL PROTEIN: 6 g/dL — AB (ref 6.5–8.1)

## 2016-09-17 LAB — MAGNESIUM: MAGNESIUM: 2 mg/dL (ref 1.7–2.4)

## 2016-09-17 LAB — PROTIME-INR
INR: 0.96
PROTHROMBIN TIME: 12.7 s (ref 11.4–15.2)

## 2016-09-17 MED ORDER — TRAMADOL HCL 50 MG PO TABS
50.0000 mg | ORAL_TABLET | Freq: Four times a day (QID) | ORAL | Status: DC | PRN
  Administered 2016-09-20: 50 mg via ORAL
  Filled 2016-09-17: qty 1

## 2016-09-17 MED ORDER — PANTOPRAZOLE SODIUM 40 MG PO TBEC
80.0000 mg | DELAYED_RELEASE_TABLET | Freq: Every day | ORAL | Status: DC
  Administered 2016-09-17 – 2016-09-20 (×3): 80 mg via ORAL
  Filled 2016-09-17 (×3): qty 2

## 2016-09-17 MED ORDER — SODIUM CHLORIDE 0.9 % IV BOLUS (SEPSIS)
1000.0000 mL | Freq: Once | INTRAVENOUS | Status: AC
  Administered 2016-09-17: 1000 mL via INTRAVENOUS

## 2016-09-17 MED ORDER — SODIUM CHLORIDE 0.9 % IV SOLN
Freq: Once | INTRAVENOUS | Status: AC
  Administered 2016-09-17: 01:00:00 via INTRAVENOUS

## 2016-09-17 MED ORDER — SODIUM CHLORIDE 0.9 % IV SOLN
30.0000 meq | Freq: Once | INTRAVENOUS | Status: AC
  Administered 2016-09-17: 30 meq via INTRAVENOUS
  Filled 2016-09-17: qty 15

## 2016-09-17 MED ORDER — ONDANSETRON HCL 4 MG PO TABS: 4.0000 mg | ORAL_TABLET | Freq: Four times a day (QID) | ORAL | Status: DC | PRN

## 2016-09-17 MED ORDER — DIPHENHYDRAMINE HCL 50 MG/ML IJ SOLN
25.0000 mg | Freq: Once | INTRAMUSCULAR | Status: AC
  Administered 2016-09-17: 25 mg via INTRAVENOUS
  Filled 2016-09-17: qty 1

## 2016-09-17 MED ORDER — TRIAMTERENE-HCTZ 37.5-25 MG PO TABS
1.0000 | ORAL_TABLET | Freq: Every day | ORAL | Status: DC
  Administered 2016-09-17: 1 via ORAL
  Filled 2016-09-17: qty 1

## 2016-09-17 MED ORDER — DIAZEPAM 2 MG PO TABS
1.0000 mg | ORAL_TABLET | Freq: Three times a day (TID) | ORAL | Status: DC | PRN
  Administered 2016-09-20: 1 mg via ORAL
  Filled 2016-09-17: qty 1

## 2016-09-17 MED ORDER — CALCIUM CARBONATE-VITAMIN D 500-200 MG-UNIT PO TABS
1.0000 | ORAL_TABLET | Freq: Two times a day (BID) | ORAL | Status: DC
  Administered 2016-09-17 – 2016-09-20 (×5): 1 via ORAL
  Filled 2016-09-17 (×5): qty 1

## 2016-09-17 MED ORDER — POLYETHYLENE GLYCOL 3350 17 G PO PACK
17.0000 g | PACK | Freq: Every day | ORAL | Status: DC
  Filled 2016-09-17 (×3): qty 1

## 2016-09-17 MED ORDER — FAMOTIDINE 20 MG PO TABS
20.0000 mg | ORAL_TABLET | Freq: Every day | ORAL | Status: DC
  Administered 2016-09-17 – 2016-09-20 (×3): 20 mg via ORAL
  Filled 2016-09-17 (×3): qty 1

## 2016-09-17 MED ORDER — SODIUM CHLORIDE 0.9 % IV BOLUS (SEPSIS): 1000.0000 mL | Freq: Once | INTRAVENOUS | Status: DC

## 2016-09-17 MED ORDER — SENNA 8.6 MG PO TABS
1.0000 | ORAL_TABLET | Freq: Every day | ORAL | Status: DC
  Filled 2016-09-17 (×3): qty 1

## 2016-09-17 MED ORDER — SODIUM CHLORIDE 0.9 % IV SOLN
30.0000 meq | INTRAVENOUS | Status: AC
  Administered 2016-09-17: 30 meq via INTRAVENOUS
  Filled 2016-09-17 (×2): qty 15

## 2016-09-17 MED ORDER — ENOXAPARIN SODIUM 40 MG/0.4ML ~~LOC~~ SOLN
40.0000 mg | Freq: Every day | SUBCUTANEOUS | Status: AC
  Administered 2016-09-17: 40 mg via SUBCUTANEOUS
  Filled 2016-09-17: qty 0.4

## 2016-09-17 MED ORDER — ONDANSETRON HCL 4 MG/2ML IJ SOLN
4.0000 mg | Freq: Four times a day (QID) | INTRAMUSCULAR | Status: DC | PRN
  Filled 2016-09-17: qty 2

## 2016-09-17 MED ORDER — SODIUM CHLORIDE 0.9 % IV SOLN
INTRAVENOUS | Status: DC
  Administered 2016-09-17: 21:00:00 via INTRAVENOUS
  Filled 2016-09-17 (×3): qty 1000

## 2016-09-17 MED ORDER — SODIUM CHLORIDE 0.9 % IV SOLN
INTRAVENOUS | Status: DC
  Administered 2016-09-17: 02:00:00 via INTRAVENOUS

## 2016-09-17 MED ORDER — DEXTROSE 5 % IV SOLN
2.0000 g | INTRAVENOUS | Status: DC
  Administered 2016-09-17 – 2016-09-19 (×3): 2 g via INTRAVENOUS
  Filled 2016-09-17 (×3): qty 2

## 2016-09-17 MED ORDER — METOPROLOL SUCCINATE ER 25 MG PO TB24
25.0000 mg | ORAL_TABLET | Freq: Every day | ORAL | Status: DC
  Administered 2016-09-17 – 2016-09-20 (×4): 25 mg via ORAL
  Filled 2016-09-17 (×4): qty 1

## 2016-09-17 MED ORDER — ZOLPIDEM TARTRATE 5 MG PO TABS
5.0000 mg | ORAL_TABLET | Freq: Every evening | ORAL | Status: DC | PRN
  Administered 2016-09-17 – 2016-09-18 (×2): 5 mg via ORAL
  Filled 2016-09-17 (×2): qty 1

## 2016-09-17 MED ORDER — MORPHINE SULFATE (PF) 2 MG/ML IV SOLN: 1.0000 mg | INTRAVENOUS | Status: DC | PRN

## 2016-09-17 MED ORDER — HYDROXYZINE HCL 25 MG PO TABS
25.0000 mg | ORAL_TABLET | Freq: Once | ORAL | Status: AC
  Administered 2016-09-17: 25 mg via ORAL
  Filled 2016-09-17: qty 1

## 2016-09-17 MED ORDER — SODIUM CHLORIDE 0.9% FLUSH
3.0000 mL | Freq: Two times a day (BID) | INTRAVENOUS | Status: DC
  Administered 2016-09-18 – 2016-09-20 (×2): 3 mL via INTRAVENOUS

## 2016-09-17 NOTE — ED Notes (Signed)
Pt and family made aware of bed assignment 

## 2016-09-17 NOTE — Consult Note (Signed)
Referring Provider: [facility]    Primary Care Physician:  Lamar Blinks, MD Primary Gastroenterologist:   Althia Forts  Reason for Consultation:  pancreatitis  ASSESSMENT AND PLAN:   31. 76 yo female with acute pancreatitis (on HCTZ but suspect gallstone related pancreatitis) with cholelithiasis and possible choledocholithiasis.  She is hemodynamically stable. HCT 35. She feels better today, LFTs still abnormal but improving. WBC down from 18 to 12.  -Trying to schedule ERCP with anesthesia support. This will likely not be possible until tomorrow afternoon. In the interim:  -am CMET, CBC -NPO -IV hydration. Got a liter bolus in ED, now at 145ml /hr -IV antibiotics -Surgery already following for eventual cholecystectomy  2. GERD, chronic. Asymptomatic on H2 blockers  3. Hypokalemia, K+ < 3. Will defer repletion to Hospitalist. Needd to keep in normal range in anticipation of procedures / surgery    HPI: Margaret [name] is a 76 y.o. female admitted with acute upper abdominal pain. She saw PCP yesterday, he called her with labs and told her to go to ED. Pain intermittent, non-radiating, it started two days ago. Pain not better or worse with food. No vomiting. No fevers or chills. She has never had pain like this before. Currently feels better, without pain meds. No bowel changes or other GI complaints. Patient had a Copywriter, advertising up [place] who followed her for GERD.    Past Medical History:  Diagnosis Date  . Arthritis   . History of healed stress fracture 2013   bilateral feet  . Hx of phlebitis    reports remote hx of "superfical blood clots" never anticoagulated  . Hypertension   . Osteopenia     Past Surgical History:  Procedure Laterality Date  . FOOT SURGERY     patient reports four foot surgeries  . FOOT SURGERY Left    bunion, hammer toe surgery  . FOOT SURGERY Right    shaved bunion, hammer toe correction  . KNEE ARTHROSCOPY Right 1997  .  REPLACEMENT TOTAL KNEE Left   . 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 RETINAL DETACHMENT SURGERY    . RETINAL DETACHMENT SURGERY Left 2008  . THYROIDECTOMY, PARTIAL Right   . THYROIDECTOMY, PARTIAL  2005    Prior to Admission medications   Medication Sig Start Date End Date Taking? Authorizing Provider  Calcium Carb-Cholecalciferol (CALCIUM 600 + D) 600-200 MG-UNIT TABS Take 1 tablet by mouth 2 (two) times daily.   Yes Historical Provider, MD  metoprolol succinate (TOPROL-XL) 25 MG 24 hr tablet TAKE 1 TABLET BY MOUTH EVERY DAY 08/31/16  Yes Debbrah Alar, NP  triamterene-hydrochlorothiazide (MAXZIDE-25) 37.5-25 MG tablet Take 1 tablet by mouth daily. 08/06/16  Yes Gay Filler Copland, MD  diazepam (VALIUM) 2 MG tablet Take 1/2 or 1 twice a day as needed for anxiety 09/14/16   Gay Filler Copland, MD  ranitidine (ZANTAC) 300 MG tablet Take 1 tablet (300 mg total) by mouth at bedtime. 09/16/16   Colon Branch, MD    Current Facility-Administered Medications  Medication Dose Route Frequency Provider Last Rate Last Dose  . 0.9 %  sodium chloride infusion   Intravenous Continuous Sid Falcon, MD 150 mL/hr at 09/17/16 0226    . calcium-vitamin D (OSCAL WITH D) 500-200 MG-UNIT per tablet 1 tablet  1 tablet Oral BID WC Sid Falcon, MD   1 tablet at 09/17/16 0834  . cefTRIAXone (ROCEPHIN) 2 g in dextrose 5 % 50 mL IVPB  2 g Intravenous Q24H Sanda Klein  Amend, [place] at 09/17/16 0813  . diazepam (VALIUM) tablet 1-2 mg  1-2 mg Oral Q8H PRN Sid Falcon, MD      . enoxaparin (LOVENOX) injection 40 mg  40 mg Subcutaneous Daily Sid Falcon, MD      . famotidine (PEPCID) tablet 20 mg  20 mg Oral Daily Sid Falcon, MD      . metoprolol succinate (TOPROL-XL) 24 hr tablet 25 mg  25 mg Oral Daily Sid Falcon, MD      . morphine 2 MG/ML injection 1 mg  1 mg Intravenous Q4H PRN Sid Falcon, MD      . ondansetron [facility]) tablet 4 mg  4 mg Oral Q6H PRN Sid Falcon, MD       Or  . ondansetron  [facility]) injection 4 mg  4 mg Intravenous Q6H PRN Sid Falcon, MD      . pantoprazole (PROTONIX) EC tablet 80 mg  80 mg Oral Daily Sid Falcon, MD      . potassium chloride 30 mEq in sodium chloride 0.9 % 265 mL (KCL MULTIRUN) IVPB  30 mEq Intravenous Q4H Modena Jansky, MD      . sodium chloride flush (NS) 0.9 % injection 3 mL  3 mL Intravenous Q12H Sid Falcon, MD      . traMADol Veatrice Bourbon) tablet 50 mg  50 mg Oral Q6H PRN Sid Falcon, MD      . triamterene-hydrochlorothiazide (ZGYFVCB-44) 37.5-25 MG per tablet 1 tablet  1 tablet Oral Daily Sid Falcon, MD        Allergies as of 09/16/2016 - Review Complete 09/16/2016  Allergen Reaction Noted  . Percocet [oxycodone-acetaminophen] Other (See Comments) 03/05/2015    Family History  Problem Relation Age of Onset  . Heart disease Mother   . Heart attack Mother   . Heart disease Father   . Diabetes Maternal Grandmother   . Cancer Paternal Grandfather     stomach    Social History   Social History  . Marital status: Married    Spouse name: N/A  . Number of children: N/A  . Years of education: N/A   Occupational History  . Not on file.   Social History Main Topics  . Smoking status: Former Smoker    Quit date: 06/02/1979  . Smokeless tobacco: Never Used  . Alcohol use 0.6 oz/week    1 Glasses of wine per week     Comment: Occ  . Drug use: No  . Sexual activity: Not Currently   Other Topics Concern  . 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  Physical Exam: Vital signs in last 24 hours: Temp:  [98.1 F (36.7 C)-99.4 F (37.4 C)] 98.1 F (36.7 C) (04/19 0607) Pulse Rate:  [67-84] 71 (04/19 0607) Resp:  [14-22] 18  (04/19 0607) BP: (116-157)/(56-87) 121/56 (04/19 0607) SpO2:  [94 %-100 %] 94 % (04/19 0607) Weight:  [168 lb (76.2 kg)-168 lb 2 oz (76.3 kg)] 168 lb (76.2 kg) (04/18 1830) Last BM Date: 09/16/16 General:   Alert,  well-developed,  pleasant and cooperative in NAD Eyes:  Sclera clear, no icterus.   Conjunctiva pink. Ears:  Normal auditory acuity. Nose:  No deformity, discharge,  or lesions. Neck:  Supple; no masses Lungs:  Clear throughout to auscultation.   No wheezes, crackles, or rhonchi.  Heart:  Regular rate and rhythm; no murmurs, no edema. Abdomen:  Soft, mild RUQ tenderness, BS active,no palp mass or hsm.   Rectal:  Deferred  Msk:  Symmetrical without gross deformities. . Pulses:  Normal pulses noted. Extremities:  Without clubbing or edema. Neurologic:  Alert and  oriented x4;  grossly normal neurologically. Skin:  Intact without significant lesions or rashes.. Psych:  Alert and cooperative. Normal mood and affect.  Intake/Output from previous day: 04/18 0701 - 04/19 0700 In: 685 [I.V.:685] Out: -  Intake/Output this shift: No intake/output data recorded.  Lab Results:  Recent Labs  09/16/16 1208 09/17/16 0431  WBC 18.6* 12.4*  HGB 14.0 11.7*  HCT 41.9 35.6*  PLT 275.0 241   BMET  Recent Labs  09/16/16 1208 09/17/16 0431  NA 138 140  K 3.3* 2.8*  CL 99 104  CO2 28 24  GLUCOSE 129* 80  BUN 19 16  CREATININE 1.23* 1.12*  CALCIUM 9.5 8.5*   LFT  Recent Labs  09/17/16 0431  PROT 6.0*  ALBUMIN 2.6*  AST 95*  ALT 117*  ALKPHOS 160*  BILITOT 5.0*   PT/INR  Recent Labs  09/17/16 0431  LABPROT 12.7  INR 0.96   Hepatitis Panel No results for input(s): HEPBSAG, HCVAB, HEPAIGM, HEPBIGM in the last 72 hours.  Studies/Results: US Abdomen Limited  Result Date: 09/16/2016 CLINICAL DATA:  Right upper quadrant abdominal pain and tenderness beginning today. Elevated white count. EXAM: US ABDOMEN LIMITED - RIGHT UPPER QUADRANT COMPARISON:  None.  FINDINGS: Gallbladder: Echogenic sludge within the gallbladder. Gallstones up to 1.2 cm. Wall thickening up to 4 mm suggesting cholecystitis. However, the patient did not have a positive Murphy sign. Common bile duct: Diameter: 6 mm.  No definite ductal stone. Liver: Parenchyma appears normal in echogenicity. I think there is mild intrahepatic ductal dilatation. IMPRESSION: Findings worrisome for cholecystitis. Stones and echogenic sludge in the gallbladder. Gallbladder wall thickening. No Murphy sign however. Mild biliary ductal prominence raises the possibility of a common duct stone as well. This was not specifically visualized. Electronically Signed   By: Nelson Chimes M.D.   On: 09/16/2016 23:02   Dg Abd 2 Views  Result Date: 09/16/2016 CLINICAL DATA:  Abdominal pain. EXAM: ABDOMEN - 2 VIEW COMPARISON:  No recent. FINDINGS: Soft tissue structures are unremarkable. No bowel distention. Moderate stool volume . No free air. Stool noted throughout the colon. Degenerative changes thoracolumbar spine with scoliosis concave left. Pelvic calcifications consistent phleboliths. Calcified fibroids may be present. Pessary noted. IMPRESSION: Moderate stool volume. Constipation cannot be excluded. No acute abnormality identified. Electronically Signed   By: Marcello Moores  Register   On: 09/16/2016 15:10    Tye Savoy, NP-C @  09/17/2016, 9:04 AM  Pager number [phone]  I have reviewed the entire case in detail with the above APP and discussed the plan in detail.  Therefore, I agree with the diagnoses recorded above. In addition,  I have personally interviewed and examined the patient and have personally reviewed any abdominal/pelvic CT scan images.  My additional thoughts are as follows:  Additional diagnoses: Elevated LFTs  She is feeling much better today than yesterday, is virtually pain free and has no tenderness on exam. The pancreatitis seems to have improved considerably. It  is not clear if she truly  has cholecystitis versus some wall thickening from adjacent pancreatic inflammation. Nevertheless, the clinical suspicion is high for choledocholithiasis.  She is agreeable to having an ERCP tomorrow. I discussed the procedure in detail and showed her diagram of the anatomy. Risks were reviewed in detail.  The benefits and risks of the planned procedure were described in detail with the patient or (when appropriate) their health care proxy.  Risks were outlined as including, but not limited to, bleeding, infection, perforation, adverse medication reaction leading to cardiac or pulmonary decompensation, or pancreatitis (if ERCP).  The limitation of incomplete mucosal visualization was also discussed.  No guarantees or warranties were given.   She is very anxious to get home because her husband was just released from the hospital after 5 weeks in the ICU. I have told her she should expect very likely stay tonight after her ERCP tomorrow since presently we are not able to do it until the mid to late afternoon.  We would have to ask the surgical consultant their opinion on whether it would be safe to take the gallbladder out at a later date. I will leave that to surgical team to discuss with the patient.  The meantime, we will continue current antibiotics in case there is an element of cholecystitis.  Nelida Meuse III Pager [phone]  Mon-Fri 8a-5p [phone] after 5p, weekends, holidays

## 2016-09-17 NOTE — Progress Notes (Addendum)
PROGRESS NOTE   Margaret [name]  SLH:[id number]    DOB: 1940/09/14    DOA: 09/16/2016  PCP: Lamar Blinks, MD   I have briefly reviewed patients previous medical records in [facility].  Brief Narrative:  76 year old female with PMH of HTN on Maxzide, GERD, presented to the ED with abdominal pain and diagnosed with acute biliary pancreatitis and possibly acute cholecystitis. Admission labs: Lipase >300, AST 127, ALT 147, WBC 18.6. [facility] surgery consulted and planned for ERCP followed by cholecystectomy with IOC. Clinically improved.   Assessment & Plan:   Active Problems:   HTN (hypertension)   Osteopenia   GERD (gastroesophageal reflux disease)   Acute pancreatitis due to calculus of common bile duct   Acute gallstone pancreatitis   1. Acute biliary pancreatitis: History and lab work on admission as indicated above. Abdominal pain currently resolved. [facility] consultation appreciated >ERCP possibly 4/20 afternoon. Continue nothing by mouth, IV fluids and IV antibiotics/Rocephin. Trend lipase and LFTs. 2. Cholelithiasis/choledocholithiasis complicated by acute cholecystitis: Gen. surgery input appreciated. Plans are for post-ERCP laparoscopic cholecystectomy and IOC. Management as per problem #1. 3. Hypokalemia: Replacing aggressively IV. Magnesium 2. Follow BMP in a.m. 4. GERD: Continue PPI and H2 blocker. 5. Acute kidney injury, mild: Improved. IV fluids. 6. Essential hypertension: Controlled. Continue metoprolol. Temporarily hold Maxide due to pancreatitis. 7. Constipation: Seen on x-ray. Bowel regimen.   DVT prophylaxis: Lovenox Code Status: Full Family Communication: None at bedside Disposition: DC home when medically improved and stable, possibly in 48-72 hours.   Consultants:  General surgery [facility]   Procedures:  None  Antimicrobials:  IV ceftriaxone    Subjective: Seen this morning. Abdominal pain resolved sometime last night.  Feels hungry. Denies any other complaints.   ROS: No chest pain, dyspnea, dysuria or urinary frequency. States that her husband was hospitalized to [facility] ICU for the last 5 weeks and is being discharged today and is upset with the timing of her hospitalization.  Objective:  Vitals:   09/17/16 0100 09/17/16 0143 09/17/16 0607 09/17/16 1443  BP: 135/70 131/60 (!) 121/56 130/65  Pulse: 84 71 71 69  Resp:  18 18 16   Temp:  99.4 F (37.4 C) 98.1 F (36.7 C) 98.2 F (36.8 C)  TempSrc:   Oral Oral  SpO2: 99% 99% 94% 100%  Weight:      Height:        Examination:  General exam: Pleasant elderly female lying comfortably supine in bed. Does not appear septic or toxic. Respiratory system: Clear to auscultation. Respiratory effort normal. Cardiovascular system: S1 & S2 heard, RRR. No JVD, murmurs, rubs, gallops or clicks. No pedal edema. Telemetry: Sinus rhythm. Gastrointestinal system: Abdomen is nondistended, soft and nontender. No organomegaly or masses felt. Normal bowel sounds heard. Central nervous system: Alert and oriented. No focal neurological deficits. Extremities: Symmetric 5 x 5 power. Skin: No rashes, lesions or ulcers Psychiatry: Judgement and insight appear normal. Mood & affect appropriate.     Data Reviewed: I have personally reviewed following labs and imaging studies  CBC:  Recent Labs Lab 09/16/16 1208 09/17/16 0431 09/17/16 1234  WBC 18.6* 12.4* 12.1*  NEUTROABS 16.8*  --   --   HGB 14.0 11.7* 12.1  HCT 41.9 35.6* 36.4  MCV 93.3 92.2 92.4  PLT 275.0 241 157   Basic Metabolic Panel:  Recent Labs Lab 09/16/16 1208 09/17/16 0431 09/17/16 0808  NA 138 140  --   K 3.3* 2.8*  --  CL 99 104  --   CO2 28 24  --   GLUCOSE 129* 80  --   BUN 19 16  --   CREATININE 1.23* 1.12*  --   CALCIUM 9.5 8.5*  --   MG  --   --  2.0   Liver Function Tests:  Recent Labs Lab 09/16/16 1208 09/17/16 0431  AST 127* 95*  ALT 147* 117*  ALKPHOS 187*  160*  BILITOT 5.3* 5.0*  PROT 7.2 6.0*  ALBUMIN 3.8 2.6*   Coagulation Profile:  Recent Labs Lab 09/17/16 0431  INR 0.96   No results found for this or any previous visit (from the past 240 hour(s)).       Radiology Studies: US Abdomen Limited  Result Date: 09/16/2016 CLINICAL DATA:  Right upper quadrant abdominal pain and tenderness beginning today. Elevated white count. EXAM: US ABDOMEN LIMITED - RIGHT UPPER QUADRANT COMPARISON:  None. FINDINGS: Gallbladder: Echogenic sludge within the gallbladder. Gallstones up to 1.2 cm. Wall thickening up to 4 mm suggesting cholecystitis. However, the patient did not have a positive Murphy sign. Common bile duct: Diameter: 6 mm.  No definite ductal stone. Liver: Parenchyma appears normal in echogenicity. I think there is mild intrahepatic ductal dilatation. IMPRESSION: Findings worrisome for cholecystitis. Stones and echogenic sludge in the gallbladder. Gallbladder wall thickening. No Murphy sign however. Mild biliary ductal prominence raises the possibility of a common duct stone as well. This was not specifically visualized. Electronically Signed   By: Nelson Chimes M.D.   On: 09/16/2016 23:02   Dg Abd 2 Views  Result Date: 09/16/2016 CLINICAL DATA:  Abdominal pain. EXAM: ABDOMEN - 2 VIEW COMPARISON:  No recent. FINDINGS: Soft tissue structures are unremarkable. No bowel distention. Moderate stool volume . No free air. Stool noted throughout the colon. Degenerative changes thoracolumbar spine with scoliosis concave left. Pelvic calcifications consistent phleboliths. Calcified fibroids may be present. Pessary noted. IMPRESSION: Moderate stool volume. Constipation cannot be excluded. No acute abnormality identified. Electronically Signed   By: Marcello Moores  Register   On: 09/16/2016 15:10        Scheduled Meds: . calcium-vitamin D  1 tablet Oral BID WC  . famotidine  20 mg Oral Daily  . metoprolol succinate  25 mg Oral Daily  . pantoprazole  80 mg  Oral Daily  . sodium chloride flush  3 mL Intravenous Q12H  . triamterene-hydrochlorothiazide  1 tablet Oral Daily   Continuous Infusions: . cefTRIAXone (ROCEPHIN)  IV Stopped (09/17/16 0813)  . potassium chloride (KCL MULTIRUN) 30 mEq in 265 mL IVPB       LOS: 0 days     Altin Sease, MD, FACP, FHM. [facility] Pager [phone]  If 7PM-7AM, please contact night-coverage [email or web address] Password [email or web address] 09/17/2016, 4:10 PM

## 2016-09-17 NOTE — Progress Notes (Signed)
Pharmacy Antibiotic Note  Margaret [name] is a 76 y.o. female admitted on 09/16/2016 with intra-abd infection.  Pharmacy has been consulted for Rocephin dosing.  Plan: Rocephin 2gm IV q24h Pharmacy will sign off - please reconsult if needed  Height: 5\' 2"  (157.5 cm) Weight: 168 lb (76.2 kg) IBW/kg (Calculated) : 50.1  Temp (24hrs), Avg:98.5 F (36.9 C), Min:98.1 F (36.7 C), Max:99.4 F (37.4 C)   Recent Labs Lab 09/16/16 1208 09/17/16 0431  WBC 18.6* 12.4*  CREATININE 1.23* 1.12*    Estimated Creatinine Clearance: 40.8 mL/min (A) (by C-G formula based on SCr of 1.12 mg/dL (H)).    Allergies  Allergen Reactions  . Percocet [Oxycodone-Acetaminophen] Other (See Comments)    Pt. reports that the medication causes her to be light headed and pass out.    Antimicrobials this admission: 4/19 Rocephin >>    Thank you for allowing pharmacy to be a part of this patient's care.  Sherlon Handing, PharmD, BCPS Clinical pharmacist, pager [phone] 09/17/2016 6:51 AM

## 2016-09-17 NOTE — Telephone Encounter (Signed)
Currently admitted to the hospital.

## 2016-09-17 NOTE — Consult Note (Signed)
Reason for Consult:cholecystitis with gallstones Referring Physician: Dr. Gilles Chiquito  Margaret [name] is an 76 y.o. female.  HPI: This is a 76 year old female who we have been asked to evaluate for cholecystitis with cholelithiasis. She reports that she had the sudden onset abdominal pain at approximately 2 AM yesterday.  She described the pain as severe hurting due to her back. She went to her primary care physician and had labs drawn which were abnormal. She was sent to the emergency department. She has no nausea or vomiting. At presentation to the ER, she was found to have elevated liver function tests including a bilirubin of 5.3 and elevated white blood count, and a lipase of greater than 3000. [facility]. This morning, she reports that her abdominal discomfort has improved.  Past Medical History:  Diagnosis Date  . Arthritis   . History of healed stress fracture 2013   bilateral feet  . Hx of phlebitis    reports remote hx of "superfical blood clots" never anticoagulated  . Hypertension   . Osteopenia     Past Surgical History:  Procedure Laterality Date  . FOOT SURGERY     patient reports four foot surgeries  . FOOT SURGERY Left    bunion, hammer toe surgery  . FOOT SURGERY Right    shaved bunion, hammer toe correction  . KNEE ARTHROSCOPY Right 1997  . REPLACEMENT TOTAL KNEE Left   . 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 RETINAL DETACHMENT SURGERY    . RETINAL DETACHMENT SURGERY Left 2008  . THYROIDECTOMY, PARTIAL Right   . THYROIDECTOMY, PARTIAL  2005    Family History  Problem Relation Age of Onset  . Heart disease Mother   . Heart attack Mother   . Heart disease Father   . Diabetes Maternal Grandmother   . Cancer Paternal Grandfather     stomach    Social History:  reports that she quit smoking about 37 years ago. She has never used smokeless tobacco. She reports that she drinks about 0.6 oz of alcohol per week . She  reports that she does not use drugs.  Allergies:  Allergies  Allergen Reactions  . Percocet [Oxycodone-Acetaminophen] Other (See Comments)    Pt. reports that the medication causes her to be light headed and pass out.    Medications: I have reviewed the patient's current medications.  Results for orders placed or performed during the hospital encounter of 09/16/16 (from the past 48 hour(s))  Lipase, blood     Status: Abnormal   Collection Time: 09/16/16  9:35 PM  Result Value Ref Range   Lipase 3,160 (H) 11 - 51 U/L    Comment: RESULTS CONFIRMED BY MANUAL DILUTION  Urinalysis, Routine w reflex microscopic     Status: Abnormal   Collection Time: 09/16/16  9:37 PM  Result Value Ref Range   Color, Urine AMBER (A) YELLOW    Comment: BIOCHEMICALS MAY BE AFFECTED BY COLOR   APPearance HAZY (A) CLEAR   Specific Gravity, Urine 1.017 1.005 - 1.030   pH 5.0 5.0 - 8.0   Glucose, UA NEGATIVE NEGATIVE mg/dL   Hgb urine dipstick SMALL (A) NEGATIVE   Bilirubin Urine NEGATIVE NEGATIVE   Ketones, ur NEGATIVE NEGATIVE mg/dL   Protein, ur 30 (A) NEGATIVE mg/dL   Nitrite NEGATIVE NEGATIVE   Leukocytes, UA NEGATIVE NEGATIVE   RBC / HPF 0-5 0 - 5 RBC/hpf   WBC, UA 6-30 0 - 5 WBC/hpf  Bacteria, UA FEW (A) NONE SEEN   Squamous Epithelial / LPF 0-5 (A) NONE SEEN   Mucous PRESENT   Comprehensive metabolic panel     Status: Abnormal   Collection Time: 09/17/16  4:31 AM  Result Value Ref Range   Sodium 140 135 - 145 mmol/L   Potassium 2.8 (L) 3.5 - 5.1 mmol/L   Chloride 104 101 - 111 mmol/L   CO2 24 22 - 32 mmol/L   Glucose, Bld 80 65 - 99 mg/dL   BUN 16 6 - 20 mg/dL   Creatinine, Ser 1.12 (H) 0.44 - 1.00 mg/dL   Calcium 8.5 (L) 8.9 - 10.3 mg/dL   Total Protein 6.0 (L) 6.5 - 8.1 g/dL   Albumin 2.6 (L) 3.5 - 5.0 g/dL   AST 95 (H) 15 - 41 U/L   ALT 117 (H) 14 - 54 U/L   Alkaline Phosphatase 160 (H) 38 - 126 U/L   Total Bilirubin 5.0 (H) 0.3 - 1.2 mg/dL   GFR calc non Af Amer 46 (L) >60  mL/min   GFR calc Af Amer 54 (L) >60 mL/min    Comment: (NOTE) The eGFR has been calculated using the CKD EPI equation. This calculation has not been validated in all clinical situations. eGFR's persistently <60 mL/min signify possible Chronic Kidney Disease.    Anion gap 12 5 - 15  CBC     Status: Abnormal   Collection Time: 09/17/16  4:31 AM  Result Value Ref Range   WBC 12.4 (H) 4.0 - 10.5 K/uL   RBC 3.86 (L) 3.87 - 5.11 MIL/uL   Hemoglobin 11.7 (L) 12.0 - 15.0 g/dL   HCT 35.6 (L) 36.0 - 46.0 %   MCV 92.2 78.0 - 100.0 fL   MCH 30.3 26.0 - 34.0 pg   MCHC 32.9 30.0 - 36.0 g/dL   RDW 14.8 11.5 - 15.5 %   Platelets 241 150 - 400 K/uL  Protime-INR     Status: None   Collection Time: 09/17/16  4:31 AM  Result Value Ref Range   Prothrombin Time 12.7 11.4 - 15.2 seconds   INR 0.96     US Abdomen Limited  Result Date: 09/16/2016 CLINICAL DATA:  Right upper quadrant abdominal pain and tenderness beginning today. Elevated white count. EXAM: US ABDOMEN LIMITED - RIGHT UPPER QUADRANT COMPARISON:  None. FINDINGS: Gallbladder: Echogenic sludge within the gallbladder. Gallstones up to 1.2 cm. Wall thickening up to 4 mm suggesting cholecystitis. However, the patient did not have a positive Murphy sign. Common bile duct: Diameter: 6 mm.  No definite ductal stone. Liver: Parenchyma appears normal in echogenicity. I think there is mild intrahepatic ductal dilatation. IMPRESSION: Findings worrisome for cholecystitis. Stones and echogenic sludge in the gallbladder. Gallbladder wall thickening. No Murphy sign however. Mild biliary ductal prominence raises the possibility of a common duct stone as well. This was not specifically visualized. Electronically Signed   By: Nelson Chimes M.D.   On: 09/16/2016 23:02   Dg Abd 2 Views  Result Date: 09/16/2016 CLINICAL DATA:  Abdominal pain. EXAM: ABDOMEN - 2 VIEW COMPARISON:  No recent. FINDINGS: Soft tissue structures are unremarkable. No bowel distention.  Moderate stool volume . No free air. Stool noted throughout the colon. Degenerative changes thoracolumbar spine with scoliosis concave left. Pelvic calcifications consistent phleboliths. Calcified fibroids may be present. Pessary noted. IMPRESSION: Moderate stool volume. Constipation cannot be excluded. No acute abnormality identified. Electronically Signed   By: Marcello Moores  Register   On: 09/16/2016 15:10  Review of Systems  Constitutional: Negative for chills and fever.  HENT: Negative for sore throat.   Respiratory: Negative for cough and wheezing.   Cardiovascular: Negative for chest pain.  Gastrointestinal: Positive for abdominal pain. Negative for diarrhea, nausea and vomiting.  Genitourinary: Negative for dysuria.  Neurological: Negative for weakness.  All other systems reviewed and are negative.  Blood pressure (!) 121/56, pulse 71, temperature 98.1 F (36.7 C), temperature source Oral, resp. rate 18, height _0  (1.575 m), weight 76.2 kg (168 lb), SpO2 94 %. Physical Exam  Constitutional: She is oriented to person, place, and time. She appears well-developed and well-nourished. No distress.  HENT:  Head: Normocephalic and atraumatic.  Right Ear: External ear normal.  Left Ear: External ear normal.  Nose: Nose normal.  Mouth/Throat: Oropharynx is clear and moist. No oropharyngeal exudate.  Eyes: Conjunctivae are normal. Pupils are equal, round, and reactive to light. Right eye exhibits no discharge. Left eye exhibits no discharge. No scleral icterus.  Neck: Normal range of motion. No tracheal deviation present.  Cardiovascular: Normal rate, regular rhythm, normal heart sounds and intact distal pulses.   No murmur heard. Respiratory: Effort normal and breath sounds normal. No respiratory distress. She has no wheezes.  GI: Soft. She exhibits no distension. There is tenderness.  Her abdomen is soft with mild fullness in the epigastrium and mild tenderness in the epigastrium and right  upper quadrant  Musculoskeletal: Normal range of motion. She exhibits no edema.  Lymphadenopathy:    She has no cervical adenopathy.  Neurological: She is alert and oriented to person, place, and time.  Skin: Skin is warm and dry. No rash noted. She is not diaphoretic. No erythema.  Psychiatric: Her behavior is normal. Judgment normal.    Assessment/Plan: Gallstone pancreatitis with cholecystitis and possible common bile duct stone.  I believe she needs consultation from gastroenterology. Clinically, she is better than yesterday but her liver function tests including bilirubin are still elevated. She may need an ERCP versus MRCP preoperatively. For now, she will remain on antibiotics and NPO.  We would recommend cholecystectomy with cholangiogram this admission when clinically warranted. We will follow her closely with you.  Kalii Chesmore A 09/17/2016, 6:36 AM

## 2016-09-18 ENCOUNTER — Inpatient Hospital Stay (HOSPITAL_COMMUNITY): Payer: Medicare Other

## 2016-09-18 ENCOUNTER — Inpatient Hospital Stay (HOSPITAL_COMMUNITY): Payer: Medicare Other | Admitting: Certified Registered Nurse Anesthetist

## 2016-09-18 ENCOUNTER — Encounter (HOSPITAL_COMMUNITY)
Admission: EM | Disposition: A | Discharge status: Home / Self Care | Payer: Self-pay | Source: Home / Self Care | Attending: Internal Medicine

## 2016-09-18 ENCOUNTER — Encounter (HOSPITAL_COMMUNITY): Payer: Self-pay | Admitting: Certified Registered"

## 2016-09-18 DIAGNOSIS — K8050 Calculus of bile duct without cholangitis or cholecystitis without obstruction: Secondary | ICD-10-CM

## 2016-09-18 DIAGNOSIS — E162 Hypoglycemia, unspecified: Secondary | ICD-10-CM

## 2016-09-18 HISTORY — PX: ERCP: SHX5425

## 2016-09-18 LAB — CBC
HEMATOCRIT: 35.4 % — AB (ref 36.0–46.0)
HEMOGLOBIN: 11.9 g/dL — AB (ref 12.0–15.0)
MCH: 31.3 pg (ref 26.0–34.0)
MCHC: 33.6 g/dL (ref 30.0–36.0)
MCV: 93.2 fL (ref 78.0–100.0)
Platelets: 260 10*3/uL (ref 150–400)
RBC: 3.8 MIL/uL — ABNORMAL LOW (ref 3.87–5.11)
RDW: 15.3 % (ref 11.5–15.5)
WBC: 10.9 10*3/uL — ABNORMAL HIGH (ref 4.0–10.5)

## 2016-09-18 LAB — COMPREHENSIVE METABOLIC PANEL
ALT: 101 U/L — ABNORMAL HIGH (ref 14–54)
AST: 76 U/L — AB (ref 15–41)
Albumin: 2.4 g/dL — ABNORMAL LOW (ref 3.5–5.0)
Alkaline Phosphatase: 157 U/L — ABNORMAL HIGH (ref 38–126)
Anion gap: 11 (ref 5–15)
BUN: 17 mg/dL (ref 6–20)
CHLORIDE: 108 mmol/L (ref 101–111)
CO2: 20 mmol/L — ABNORMAL LOW (ref 22–32)
Calcium: 8.7 mg/dL — ABNORMAL LOW (ref 8.9–10.3)
Creatinine, Ser: 1.02 mg/dL — ABNORMAL HIGH (ref 0.44–1.00)
GFR calc Af Amer: >60 mL/min (ref >60)
GFR, EST NON AFRICAN AMERICAN: 52 mL/min — AB (ref >60)
Glucose, Bld: 47 mg/dL — ABNORMAL LOW (ref 65–99)
POTASSIUM: 4.1 mmol/L (ref 3.5–5.1)
Sodium: 139 mmol/L (ref 135–145)
Total Bilirubin: 4 mg/dL — ABNORMAL HIGH (ref 0.3–1.2)
Total Protein: 6.2 g/dL — ABNORMAL LOW (ref 6.5–8.1)

## 2016-09-18 LAB — GLUCOSE, CAPILLARY
GLUCOSE-CAPILLARY: 159 mg/dL — AB (ref 65–99)
GLUCOSE-CAPILLARY: 96 mg/dL (ref 65–99)
Glucose-Capillary: 41 mg/dL — CL (ref 65–99)
Glucose-Capillary: 145 mg/dL — ABNORMAL HIGH (ref 65–99)

## 2016-09-18 LAB — LIPASE, BLOOD: LIPASE: 1190 U/L — AB (ref 11–51)

## 2016-09-18 SURGERY — ERCP, WITH INTERVENTION IF INDICATED
Anesthesia: General

## 2016-09-18 MED ORDER — IOPAMIDOL (ISOVUE-300) INJECTION 61%
INTRAVENOUS | Status: DC | PRN
  Administered 2016-09-18: 50 mL via INTRAVENOUS

## 2016-09-18 MED ORDER — LIDOCAINE 2% (20 MG/ML) 5 ML SYRINGE
INTRAMUSCULAR | Status: DC | PRN
  Administered 2016-09-18: 80 mg via INTRAVENOUS

## 2016-09-18 MED ORDER — INDOMETHACIN 50 MG RE SUPP
100.0000 mg | Freq: Once | RECTAL | Status: DC
  Filled 2016-09-18: qty 2

## 2016-09-18 MED ORDER — POTASSIUM CHLORIDE IN NACL 40-0.9 MEQ/L-% IV SOLN
INTRAVENOUS | Status: DC
  Administered 2016-09-18: 125 mL/h via INTRAVENOUS
  Filled 2016-09-18: qty 1000

## 2016-09-18 MED ORDER — PHENYLEPHRINE 40 MCG/ML (10ML) SYRINGE FOR IV PUSH (FOR BLOOD PRESSURE SUPPORT)
PREFILLED_SYRINGE | INTRAVENOUS | Status: DC | PRN
  Administered 2016-09-18 (×2): 80 ug via INTRAVENOUS

## 2016-09-18 MED ORDER — ONDANSETRON HCL 4 MG/2ML IJ SOLN
INTRAMUSCULAR | Status: DC | PRN
  Administered 2016-09-18: 4 mg via INTRAVENOUS

## 2016-09-18 MED ORDER — INDOMETHACIN 50 MG RE SUPP
RECTAL | Status: DC | PRN
Start: 2016-09-18 — End: 2016-09-18
  Administered 2016-09-18: 100 mg via RECTAL

## 2016-09-18 MED ORDER — GLUCAGON HCL RDNA (DIAGNOSTIC) 1 MG IJ SOLR
INTRAMUSCULAR | Status: DC | PRN
  Administered 2016-09-18: .5 mg via INTRAVENOUS

## 2016-09-18 MED ORDER — INDOMETHACIN 50 MG RE SUPP
RECTAL | Status: AC
  Filled 2016-09-18: qty 2

## 2016-09-18 MED ORDER — LACTATED RINGERS IV SOLN
INTRAVENOUS | Status: DC
  Administered 2016-09-18: 15:00:00 via INTRAVENOUS

## 2016-09-18 MED ORDER — IOPAMIDOL (ISOVUE-300) INJECTION 61%
INTRAVENOUS | Status: AC
  Filled 2016-09-18: qty 50

## 2016-09-18 MED ORDER — DEXTROSE 5 % IV SOLN
INTRAVENOUS | Status: DC
  Administered 2016-09-18: 09:00:00 via INTRAVENOUS
  Filled 2016-09-18 (×4): qty 1000

## 2016-09-18 MED ORDER — GLUCAGON HCL RDNA (DIAGNOSTIC) 1 MG IJ SOLR
INTRAMUSCULAR | Status: AC
  Filled 2016-09-18: qty 1

## 2016-09-18 MED ORDER — FENTANYL CITRATE (PF) 100 MCG/2ML IJ SOLN
INTRAMUSCULAR | Status: DC | PRN
  Administered 2016-09-18 (×2): 50 ug via INTRAVENOUS

## 2016-09-18 MED ORDER — PROPOFOL 10 MG/ML IV BOLUS
INTRAVENOUS | Status: DC | PRN
  Administered 2016-09-18: 30 mg via INTRAVENOUS
  Administered 2016-09-18: 100 mg via INTRAVENOUS
  Administered 2016-09-18: 40 mg via INTRAVENOUS

## 2016-09-18 MED ORDER — SUCCINYLCHOLINE CHLORIDE 200 MG/10ML IV SOSY
PREFILLED_SYRINGE | INTRAVENOUS | Status: DC | PRN
  Administered 2016-09-18: 80 mg via INTRAVENOUS

## 2016-09-18 MED ORDER — DEXTROSE 50 % IV SOLN
INTRAVENOUS | Status: AC
  Administered 2016-09-18: 50 mL
  Filled 2016-09-18: qty 50

## 2016-09-18 MED ORDER — MIDAZOLAM HCL 5 MG/5ML IJ SOLN
INTRAMUSCULAR | Status: DC | PRN
  Administered 2016-09-18: 2 mg via INTRAVENOUS

## 2016-09-18 NOTE — Anesthesia Procedure Notes (Signed)
Procedure Name: Intubation Date/Time: 09/18/2016 3:31 PM Performed by: Myna Bright Pre-anesthesia Checklist: Emergency Drugs available, Patient identified, Suction available and Patient being monitored Patient Re-evaluated:Patient Re-evaluated prior to inductionOxygen Delivery Method: Circle system utilized Preoxygenation: Pre-oxygenation with 100% oxygen Intubation Type: IV induction Ventilation: Mask ventilation without difficulty Laryngoscope Size: Mac and 3 Grade View: Grade I Tube type: Oral Tube size: 7.0 mm Number of attempts: 1 Airway Equipment and Method: Stylet Placement Confirmation: ETT inserted through vocal cords under direct vision,  positive ETCO2 and breath sounds checked- equal and bilateral Secured at: 21 cm Tube secured with: Tape Dental Injury: Teeth and Oropharynx as per pre-operative assessment

## 2016-09-18 NOTE — Consult Note (Signed)
           [facility] Primary Care Navigator  09/18/2016  Margaret [name] 1941-04-21 [id number]   Went to see patientat the bedside to identify possible discharge needsbut patient is off the unit.  RN reports that patient is in a procedure (Endo)at this time.   Will attempt to meet with patient at another time, when she is available in room.  For questions, please contact:  Dannielle Huh, BSN, RN- [facility] Primary Care Navigator  Telephone: [phone] [place]

## 2016-09-18 NOTE — Progress Notes (Signed)
On reassessment pt alert and oriented x4 no signs of acute distress.

## 2016-09-18 NOTE — Progress Notes (Signed)
PROGRESS NOTE   Margaret [name]  JFH:[id number]    DOB: Nov 02, 1940    DOA: 09/16/2016  PCP: Lamar Blinks, MD   I have briefly reviewed patients previous medical records in [facility].  Brief Narrative:  76 year old female with PMH of HTN on Maxzide, GERD, presented to the ED with abdominal pain and diagnosed with acute biliary pancreatitis and possibly acute cholecystitis. Admission labs: Lipase >300, AST 127, ALT 147, WBC 18.6. [facility] and general surgery consulted and planned for ERCP followed by cholecystectomy with IOC. Clinically improved.   Assessment & Plan:   Active Problems:   HTN (hypertension)   Osteopenia   GERD (gastroesophageal reflux disease)   Acute pancreatitis due to calculus of common bile duct   Acute gallstone pancreatitis   Calculus of gallbladder with acute cholecystitis and obstruction   LFT elevation   1. Acute biliary pancreatitis: History and lab work on admission as indicated above. Abdominal pain currently resolved. [facility] consultation appreciated >ERCP possibly 4/20 afternoon. Continue nothing by mouth, IV fluids and IV antibiotics/Rocephin. Trend lipase and LFTs> better. Seen with Dr. Loletha Carrow this morning and going for ERCP in the afternoon. 2. Cholelithiasis/choledocholithiasis complicated by acute cholecystitis: Gen. surgery follow up appreciated. Plans are for post-ERCP laparoscopic cholecystectomy and IOC. Management as per problem #1. Dr. Loletha Carrow will discuss timing of surgery with the surgeons> patient wishing to return home as soon as possible to be with her spouse who just returned from [facility] after 5 weeks stay in ICU. 3. Hypokalemia: Replaced. Magnesium 2.  4. GERD: Continue PPI and H2 blocker. 5. Acute kidney injury, mild: Resolved 6. Essential hypertension: Controlled. Continue metoprolol. Temporarily hold Maxide due to pancreatitis. 7. Constipation: Seen on x-ray. Bowel regimen. 8. Hypoglycemia: noted on am labs. Monitor CBG's.  Changed IVF to D5. 9. Anemia: Probably dilutional. Follow CBCs.   DVT prophylaxis: Lovenox Code Status: Full Family Communication: None at bedside Disposition: DC home when medically improved and stable, and cleared by GI/CC[facility]   Consultants:  General surgery [facility]   Procedures:  None  Antimicrobials:  IV ceftriaxone    Subjective: Seen this morning with GI. Abdominal pain resolved without recurrence. Denies any other complaints.   ROS: No chest pain, dyspnea, dysuria or urinary frequency. States that her husband was hospitalized to  Hospital ICU for the last 5 weeks and is being discharged today and is upset with the timing of her hospitalization.  Objective:  Vitals:   09/17/16 2149 09/18/16 0618 09/18/16 1423 09/18/16 1458  BP: (!) 124/48 134/64 (!) 164/71   Pulse: 72 73  74  Resp: 17 16 (!) 22   Temp: 98 F (36.7 C) 98.5 F (36.9 C) 98.8 F (37.1 C)   TempSrc:   Oral   SpO2: 100% 98% 97%   Weight:   76.2 kg (168 lb)   Height:   5\' 2"  (1.575 m)     Examination:  General exam: Pleasant elderly female ambulating comfortably in the room. Does not appear septic or toxic. Respiratory system: Clear to auscultation. Respiratory effort normal. Cardiovascular system: S1 & S2 heard, RRR. No JVD, murmurs, rubs, gallops or clicks. No pedal edema. Telemetry: Sinus rhythm. Gastrointestinal system: Abdomen is nondistended, soft and nontender. No organomegaly or masses felt. Normal bowel sounds heard. Central nervous system: Alert and oriented. No focal neurological deficits. Extremities: Symmetric 5 x 5 power. Skin: No rashes, lesions or ulcers Psychiatry: Judgement and insight appear normal. Mood & affect appropriate.     Data  Reviewed: I have personally reviewed following labs and imaging studies  CBC:  Recent Labs Lab 09/16/16 1208 09/17/16 0431 09/17/16 1234 09/18/16 0512  WBC 18.6* 12.4* 12.1* 10.9*  NEUTROABS 16.8*  --   --   --   HGB 14.0 11.7*  12.1 11.9*  HCT 41.9 35.6* 36.4 35.4*  MCV 93.3 92.2 92.4 93.2  PLT 275.0 241 249 244   Basic Metabolic Panel:  Recent Labs Lab 09/16/16 1208 09/17/16 0431 09/17/16 0808 09/18/16 0512  NA 138 140  --  139  K 3.3* 2.8*  --  4.1  CL 99 104  --  108  CO2 28 24  --  20*  GLUCOSE 129* 80  --  47*  BUN 19 16  --  17  CREATININE 1.23* 1.12*  --  1.02*  CALCIUM 9.5 8.5*  --  8.7*  MG  --   --  2.0  --    Liver Function Tests:  Recent Labs Lab 09/16/16 1208 09/17/16 0431 09/18/16 0512  AST 127* 95* 76*  ALT 147* 117* 101*  ALKPHOS 187* 160* 157*  BILITOT 5.3* 5.0* 4.0*  PROT 7.2 6.0* 6.2*  ALBUMIN 3.8 2.6* 2.4*   Coagulation Profile:  Recent Labs Lab 09/17/16 0431  INR 0.96   No results found for this or any previous visit (from the past 240 hour(s)).       Radiology Studies: US Abdomen Limited  Result Date: 09/16/2016 CLINICAL DATA:  Right upper quadrant abdominal pain and tenderness beginning today. Elevated white count. EXAM: US ABDOMEN LIMITED - RIGHT UPPER QUADRANT COMPARISON:  None. FINDINGS: Gallbladder: Echogenic sludge within the gallbladder. Gallstones up to 1.2 cm. Wall thickening up to 4 mm suggesting cholecystitis. However, the patient did not have a positive Murphy sign. Common bile duct: Diameter: 6 mm.  No definite ductal stone. Liver: Parenchyma appears normal in echogenicity. I think there is mild intrahepatic ductal dilatation. IMPRESSION: Findings worrisome for cholecystitis. Stones and echogenic sludge in the gallbladder. Gallbladder wall thickening. No Murphy sign however. Mild biliary ductal prominence raises the possibility of a common duct stone as well. This was not specifically visualized. Electronically Signed   By: Nelson Chimes M.D.   On: 09/16/2016 23:02        Scheduled Meds: . [MAR Hold] calcium-vitamin D  1 tablet Oral BID WC  . [MAR Hold] famotidine  20 mg Oral Daily  . indomethacin  100 mg Rectal Once  . [MAR Hold] metoprolol  succinate  25 mg Oral Daily  . [MAR Hold] pantoprazole  80 mg Oral Daily  . [MAR Hold] polyethylene glycol  17 g Oral Daily  . [MAR Hold] senna  1 tablet Oral Daily  . [MAR Hold] sodium chloride flush  3 mL Intravenous Q12H   Continuous Infusions: . [MAR Hold] cefTRIAXone (ROCEPHIN)  IV Stopped (09/18/16 0750)  . dextrose 5 % 1,000 mL with potassium chloride 40 mEq infusion 125 mL/hr at 09/18/16 0927  . lactated ringers 10 mL/hr at 09/18/16 1448     LOS: 1 day     Jnai Snellgrove, MD, FACP, FHM. [facility] Pager [phone]  If 7PM-7AM, please contact night-coverage [email or web address] Password [email or web address] 09/18/2016, 4:05 PM

## 2016-09-18 NOTE — H&P (View-Only) (Signed)
Referring Provider: [facility]    Primary Care Physician:  Lamar Blinks, MD Primary Gastroenterologist:   Althia Forts  Reason for Consultation:  pancreatitis  ASSESSMENT AND PLAN:   23. 76 yo female with acute pancreatitis (on HCTZ but suspect gallstone related pancreatitis) with cholelithiasis and possible choledocholithiasis.  She is hemodynamically stable. HCT 35. She feels better today, LFTs still abnormal but improving. WBC down from 18 to 12.  -Trying to schedule ERCP with anesthesia support. This will likely not be possible until tomorrow afternoon. In the interim:  -am CMET, CBC -NPO -IV hydration. Got a liter bolus in ED, now at 129ml /hr -IV antibiotics -Surgery already following for eventual cholecystectomy  2. GERD, chronic. Asymptomatic on H2 blockers  3. Hypokalemia, K+ < 3. Will defer repletion to Hospitalist. Needd to keep in normal range in anticipation of procedures / surgery    HPI: Margaret [name] is a 76 y.o. female admitted with acute upper abdominal pain. She saw PCP yesterday, he called her with labs and told her to go to ED. Pain intermittent, non-radiating, it started two days ago. Pain not better or worse with food. No vomiting. No fevers or chills. She has never had pain like this before. Currently feels better, without pain meds. No bowel changes or other GI complaints. Patient had a Copywriter, advertising up [place] who followed her for GERD.    Past Medical History:  Diagnosis Date  . Arthritis   . History of healed stress fracture 2013   bilateral feet  . Hx of phlebitis    reports remote hx of "superfical blood clots" never anticoagulated  . Hypertension   . Osteopenia     Past Surgical History:  Procedure Laterality Date  . FOOT SURGERY     patient reports four foot surgeries  . FOOT SURGERY Left    bunion, hammer toe surgery  . FOOT SURGERY Right    shaved bunion, hammer toe correction  . KNEE ARTHROSCOPY Right 1997  .  REPLACEMENT TOTAL KNEE Left   . 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 RETINAL DETACHMENT SURGERY    . RETINAL DETACHMENT SURGERY Left 2008  . THYROIDECTOMY, PARTIAL Right   . THYROIDECTOMY, PARTIAL  2005    Prior to Admission medications   Medication Sig Start Date End Date Taking? Authorizing Provider  Calcium Carb-Cholecalciferol (CALCIUM 600 + D) 600-200 MG-UNIT TABS Take 1 tablet by mouth 2 (two) times daily.   Yes Historical Provider, MD  metoprolol succinate (TOPROL-XL) 25 MG 24 hr tablet TAKE 1 TABLET BY MOUTH EVERY DAY 08/31/16  Yes Debbrah Alar, NP  triamterene-hydrochlorothiazide (MAXZIDE-25) 37.5-25 MG tablet Take 1 tablet by mouth daily. 08/06/16  Yes Gay Filler Copland, MD  diazepam (VALIUM) 2 MG tablet Take 1/2 or 1 twice a day as needed for anxiety 09/14/16   Gay Filler Copland, MD  ranitidine (ZANTAC) 300 MG tablet Take 1 tablet (300 mg total) by mouth at bedtime. 09/16/16   Colon Branch, MD    Current Facility-Administered Medications  Medication Dose Route Frequency Provider Last Rate Last Dose  . 0.9 %  sodium chloride infusion   Intravenous Continuous Sid Falcon, MD 150 mL/hr at 09/17/16 0226    . calcium-vitamin D (OSCAL WITH D) 500-200 MG-UNIT per tablet 1 tablet  1 tablet Oral BID WC Sid Falcon, MD   1 tablet at 09/17/16 0834  . cefTRIAXone (ROCEPHIN) 2 g in dextrose 5 % 50 mL IVPB  2 g Intravenous Q24H Sanda Klein  Amend, [place] at 09/17/16 0813  . diazepam (VALIUM) tablet 1-2 mg  1-2 mg Oral Q8H PRN Sid Falcon, MD      . enoxaparin (LOVENOX) injection 40 mg  40 mg Subcutaneous Daily Sid Falcon, MD      . famotidine (PEPCID) tablet 20 mg  20 mg Oral Daily Sid Falcon, MD      . metoprolol succinate (TOPROL-XL) 24 hr tablet 25 mg  25 mg Oral Daily Sid Falcon, MD      . morphine 2 MG/ML injection 1 mg  1 mg Intravenous Q4H PRN Sid Falcon, MD      . ondansetron [facility]) tablet 4 mg  4 mg Oral Q6H PRN Sid Falcon, MD       Or  . ondansetron  [facility]) injection 4 mg  4 mg Intravenous Q6H PRN Sid Falcon, MD      . pantoprazole (PROTONIX) EC tablet 80 mg  80 mg Oral Daily Sid Falcon, MD      . potassium chloride 30 mEq in sodium chloride 0.9 % 265 mL (KCL MULTIRUN) IVPB  30 mEq Intravenous Q4H Modena Jansky, MD      . sodium chloride flush (NS) 0.9 % injection 3 mL  3 mL Intravenous Q12H Sid Falcon, MD      . traMADol Veatrice Bourbon) tablet 50 mg  50 mg Oral Q6H PRN Sid Falcon, MD      . triamterene-hydrochlorothiazide (BJSEGBT-51) 37.5-25 MG per tablet 1 tablet  1 tablet Oral Daily Sid Falcon, MD        Allergies as of 09/16/2016 - Review Complete 09/16/2016  Allergen Reaction Noted  . Percocet [oxycodone-acetaminophen] Other (See Comments) 03/05/2015    Family History  Problem Relation Age of Onset  . Heart disease Mother   . Heart attack Mother   . Heart disease Father   . Diabetes Maternal Grandmother   . Cancer Paternal Grandfather     stomach    Social History   Social History  . Marital status: Married    Spouse name: N/A  . Number of children: N/A  . Years of education: N/A   Occupational History  . Not on file.   Social History Main Topics  . Smoking status: Former Smoker    Quit date: 06/02/1979  . Smokeless tobacco: Never Used  . Alcohol use 0.6 oz/week    1 Glasses of wine per week     Comment: Occ  . Drug use: No  . Sexual activity: Not Currently   Other Topics Concern  . 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  Physical Exam: Vital signs in last 24 hours: Temp:  [98.1 F (36.7 C)-99.4 F (37.4 C)] 98.1 F (36.7 C) (04/19 0607) Pulse Rate:  [67-84] 71 (04/19 0607) Resp:  [14-22] 18  (04/19 0607) BP: (116-157)/(56-87) 121/56 (04/19 0607) SpO2:  [94 %-100 %] 94 % (04/19 0607) Weight:  [168 lb (76.2 kg)-168 lb 2 oz (76.3 kg)] 168 lb (76.2 kg) (04/18 1830) Last BM Date: 09/16/16 General:   Alert,  well-developed,  pleasant and cooperative in NAD Eyes:  Sclera clear, no icterus.   Conjunctiva pink. Ears:  Normal auditory acuity. Nose:  No deformity, discharge,  or lesions. Neck:  Supple; no masses Lungs:  Clear throughout to auscultation.   No wheezes, crackles, or rhonchi.  Heart:  Regular rate and rhythm; no murmurs, no edema. Abdomen:  Soft, mild RUQ tenderness, BS active,no palp mass or hsm.   Rectal:  Deferred  Msk:  Symmetrical without gross deformities. . Pulses:  Normal pulses noted. Extremities:  Without clubbing or edema. Neurologic:  Alert and  oriented x4;  grossly normal neurologically. Skin:  Intact without significant lesions or rashes.. Psych:  Alert and cooperative. Normal mood and affect.  Intake/Output from previous day: 04/18 0701 - 04/19 0700 In: 685 [I.V.:685] Out: -  Intake/Output this shift: No intake/output data recorded.  Lab Results:  Recent Labs  09/16/16 1208 09/17/16 0431  WBC 18.6* 12.4*  HGB 14.0 11.7*  HCT 41.9 35.6*  PLT 275.0 241   BMET  Recent Labs  09/16/16 1208 09/17/16 0431  NA 138 140  K 3.3* 2.8*  CL 99 104  CO2 28 24  GLUCOSE 129* 80  BUN 19 16  CREATININE 1.23* 1.12*  CALCIUM 9.5 8.5*   LFT  Recent Labs  09/17/16 0431  PROT 6.0*  ALBUMIN 2.6*  AST 95*  ALT 117*  ALKPHOS 160*  BILITOT 5.0*   PT/INR  Recent Labs  09/17/16 0431  LABPROT 12.7  INR 0.96   Hepatitis Panel No results for input(s): HEPBSAG, HCVAB, HEPAIGM, HEPBIGM in the last 72 hours.  Studies/Results: US Abdomen Limited  Result Date: 09/16/2016 CLINICAL DATA:  Right upper quadrant abdominal pain and tenderness beginning today. Elevated white count. EXAM: US ABDOMEN LIMITED - RIGHT UPPER QUADRANT COMPARISON:  None.  FINDINGS: Gallbladder: Echogenic sludge within the gallbladder. Gallstones up to 1.2 cm. Wall thickening up to 4 mm suggesting cholecystitis. However, the patient did not have a positive Murphy sign. Common bile duct: Diameter: 6 mm.  No definite ductal stone. Liver: Parenchyma appears normal in echogenicity. I think there is mild intrahepatic ductal dilatation. IMPRESSION: Findings worrisome for cholecystitis. Stones and echogenic sludge in the gallbladder. Gallbladder wall thickening. No Murphy sign however. Mild biliary ductal prominence raises the possibility of a common duct stone as well. This was not specifically visualized. Electronically Signed   By: Nelson Chimes M.D.   On: 09/16/2016 23:02   Dg Abd 2 Views  Result Date: 09/16/2016 CLINICAL DATA:  Abdominal pain. EXAM: ABDOMEN - 2 VIEW COMPARISON:  No recent. FINDINGS: Soft tissue structures are unremarkable. No bowel distention. Moderate stool volume . No free air. Stool noted throughout the colon. Degenerative changes thoracolumbar spine with scoliosis concave left. Pelvic calcifications consistent phleboliths. Calcified fibroids may be present. Pessary noted. IMPRESSION: Moderate stool volume. Constipation cannot be excluded. No acute abnormality identified. Electronically Signed   By: Marcello Moores  Register   On: 09/16/2016 15:10    Tye Savoy, NP-C @  09/17/2016, 9:04 AM  Pager number [phone]  I have reviewed the entire case in detail with the above APP and discussed the plan in detail.  Therefore, I agree with the diagnoses recorded above. In addition,  I have personally interviewed and examined the patient and have personally reviewed any abdominal/pelvic CT scan images.  My additional thoughts are as follows:  Additional diagnoses: Elevated LFTs  She is feeling much better today than yesterday, is virtually pain free and has no tenderness on exam. The pancreatitis seems to have improved considerably. It  is not clear if she truly  has cholecystitis versus some wall thickening from adjacent pancreatic inflammation. Nevertheless, the clinical suspicion is high for choledocholithiasis.  She is agreeable to having an ERCP tomorrow. I discussed the procedure in detail and showed her diagram of the anatomy. Risks were reviewed in detail.  The benefits and risks of the planned procedure were described in detail with the patient or (when appropriate) their health care proxy.  Risks were outlined as including, but not limited to, bleeding, infection, perforation, adverse medication reaction leading to cardiac or pulmonary decompensation, or pancreatitis (if ERCP).  The limitation of incomplete mucosal visualization was also discussed.  No guarantees or warranties were given.   She is very anxious to get home because her husband was just released from the hospital after 5 weeks in the ICU. I have told her she should expect very likely stay tonight after her ERCP tomorrow since presently we are not able to do it until the mid to late afternoon.  We would have to ask the surgical consultant their opinion on whether it would be safe to take the gallbladder out at a later date. I will leave that to surgical team to discuss with the patient.  The meantime, we will continue current antibiotics in case there is an element of cholecystitis.  Nelida Meuse III Pager [phone]  Mon-Fri 8a-5p [phone] after 5p, weekends, holidays

## 2016-09-18 NOTE — Interval H&P Note (Signed)
History and Physical Interval Note:  09/18/2016 3:12 PM  Margaret [name]  has presented today for surgery, with the diagnosis of choledocholithiasis  The various methods of treatment have been discussed with the patient and family. After consideration of risks, benefits and other options for treatment, the patient has consented to  Procedure(s): ENDOSCOPIC RETROGRADE CHOLANGIOPANCREATOGRAPHY (ERCP) (N/A) as a surgical intervention .  The patient's history has been reviewed, patient examined, no change in status, stable for surgery.  I have reviewed the patient's chart and labs.  Questions were answered to the patient's satisfaction.     Nelida Meuse III

## 2016-09-18 NOTE — Op Note (Signed)
[facility] Patient Name: Margaret [name] : 09/18/2016 MRN: [id number] Attending MD: Estill Cotta. Loletha Carrow , MD [name] of Birth: 1940-09-09 CSN: [id number] Age: 76 Admit Type: Inpatient [name]:                ERCP Indications:              Abnormal abdominal ultrasound, Biliary dilation on                            Ultrasound, Elevated liver enzymes, gallstone                            pancreatitis Providers:                Mallie Mussel L. Loletha Carrow, MD, Burtis Junes, RN, Corliss Parish,                            Technician Referring MD:              Medicines:                Indomethacin 100 mg PR, General Anesthesia Complications:            No immediate complications. Estimated Blood Loss:     Estimated blood loss: none. [name]:                Pre-Anesthesia Assessment:                           - Prior to the [name], a History and Physical                            was performed, and patient medications and                            allergies were reviewed. The patient's tolerance of                            previous anesthesia was also reviewed. The risks                            and benefits of the [name] and the sedation                            options and risks were discussed with the patient.                            All questions were answered, and informed consent                            was obtained. Prior Anticoagulants: The patient has                            taken Lovenox (enoxaparin), last dose was 1 day                            prior  to [name]. ASA Grade Assessment: II - A                            patient with mild systemic disease. After reviewing                            the risks and benefits, the patient was deemed in                            satisfactory condition to undergo the [name].                           After obtaining informed consent, the scope was                            passed under direct  vision. Throughout the                            [name], the patient's blood pressure, pulse, and                            oxygen saturations were monitored continuously. The                            [id number]) scope was introduced through                            the mouth, and used to inject contrast into and                            used to inject contrast into the bile duct. The                            ERCP was accomplished without difficulty. The                            patient tolerated the [name] well. Scope In: Scope Out: Findings:      The scout film was normal. The esophagus was successfully intubated       under direct vision. The scope was advanced to a normal major papilla in       the descending duodenum without detailed examination of the pharynx,       larynx and associated structures, and upper GI tract. The upper GI tract       was grossly normal. 0.035 inch x 260 cm straight Hydra Jagwire was       passed into the biliary tree. The traction (standard) sphincterotome was       passed over the guidewire and the bile duct was then deeply cannulated.       Contrast was injected. I personally interpreted the bile duct images.       There was brisk flow of contrast through the ducts. Image quality was       excellent. Contrast extended to the hepatic ducts. The main bile duct       was diffusely dilated. The  largest diameter was 15 mm. Opacification of       the gallbladder was successful. A 10 mm biliary sphincterotomy was made       with a traction (standard) sphincterotome using ERBE electrocautery.       There was no post-sphincterotomy bleeding. To discover objects, the       biliary tree was swept with a 15 mm balloon starting at the bifurcation.       A small amount of sludge was swept from the duct. Three total passes       were made, with the 60mm balloon passing through the sphincterotomy with       minimal resistance. A good quality  occlusion cholangiogram was obtained.       The total fluoroscopy exposure time was 3 minutes and 16 seconds. Impression:               - The entire main bile duct was dilated.                           - A biliary sphincterotomy was performed.                           - The biliary tree was swept and sludge was found. Moderate Sedation:      GETA Recommendation:           - cholecystectomy [name] Code(s):        --- Professional ---                           [phone], Endoscopic retrograde                            cholangiopancreatography (ERCP); with removal of                            calculi/debris from biliary/pancreatic duct(s)                           43262, Endoscopic retrograde                            cholangiopancreatography (ERCP); with                            sphincterotomy/papillotomy Diagnosis Code(s):        --- Professional ---                           K83.8, Other specified diseases of biliary tract                           R74.8, Abnormal levels of other serum enzymes                           R93.5, Abnormal findings on diagnostic imaging of                            other abdominal regions, including retroperitoneum CPT copyright 2016 American Medical Association. All rights reserved. The codes documented in  this report are preliminary and upon coder review may  be revised to meet current compliance requirements. Jalayia Bagheri L. Loletha Carrow, MD 09/18/2016 4:32:05 PM This report has been signed electronically. Number of Addenda: 0

## 2016-09-18 NOTE — Progress Notes (Signed)
[facility] Progress Note     Subjective: CC: gallstone pancreatitis Pt is eager to go home to help her husband who was recently discharged from [facility] with CHF exacerbation. She denies abdominal pain, fever, nausea, vomiting, or constipation. She is hungry.   States she saw Dr. Loletha Carrow this AM and is having ERCP today. Pt is very eager to go home and states that if she does not have her gallbladder removed today/this weekend she may request to go home and come back for the operation.     Objective: Vital signs in last 24 hours: Temp:  [98 F (36.7 C)-98.5 F (36.9 C)] 98.5 F (36.9 C) (04/20 0618) Pulse Rate:  [69-73] 73 (04/20 0618) Resp:  [16-17] 16 (04/20 0618) BP: (124-134)/(48-65) 134/64 (04/20 0618) SpO2:  [98 %-100 %] 98 % (04/20 0618) Last BM Date: 09/16/16  Intake/Output from previous day: 04/19 0701 - 04/20 0700 In: 1123.3 [I.V.:808.3; IV Piggyback:315] Out: 200 [Urine:200] Intake/Output this shift: No intake/output data recorded.  PE: Gen:  Alert, NAD, pleasant HEENT: no scleral icterus, EOM's in tact Card:  Regular rate and rhythm, pedal pulses 2+ BL Pulm:  Non-labored, clear to auscultation bilaterally Abd: Soft, non-tender, non-distended, bowel sounds present in all 4 quadrants Ext:  No erythema, edema, or tenderness  Pscyh: A&Ox3, anxious mood   Lab Results:   Recent Labs  09/17/16 1234 09/18/16 0512  WBC 12.1* 10.9*  HGB 12.1 11.9*  HCT 36.4 35.4*  PLT 249 260   BMET  Recent Labs  09/17/16 0431 09/18/16 0512  NA 140 139  K 2.8* 4.1  CL 104 108  CO2 24 20*  GLUCOSE 80 47*  BUN 16 17  CREATININE 1.12* 1.02*  CALCIUM 8.5* 8.7*   PT/INR  Recent Labs  09/17/16 0431  LABPROT 12.7  INR 0.96   CMP     Component Value Date/Time   NA 139 09/18/2016 0512   K 4.1 09/18/2016 0512   CL 108 09/18/2016 0512   CO2 20 (L) 09/18/2016 0512   GLUCOSE 47 (L) 09/18/2016 0512   BUN 17 09/18/2016 0512   CREATININE 1.02 (H)  09/18/2016 0512   CALCIUM 8.7 (L) 09/18/2016 0512   PROT 6.2 (L) 09/18/2016 0512   ALBUMIN 2.4 (L) 09/18/2016 0512   AST 76 (H) 09/18/2016 0512   ALT 101 (H) 09/18/2016 0512   ALKPHOS 157 (H) 09/18/2016 0512   BILITOT 4.0 (H) 09/18/2016 0512   GFRNONAA 52 (L) 09/18/2016 0512   GFRAA >60 09/18/2016 0512   Lipase     Component Value Date/Time   LIPASE 1,190 (H) 09/18/2016 0512       Studies/Results: US Abdomen Limited  Result Date: 09/16/2016 CLINICAL DATA:  Right upper quadrant abdominal pain and tenderness beginning today. Elevated white count. EXAM: US ABDOMEN LIMITED - RIGHT UPPER QUADRANT COMPARISON:  None. FINDINGS: Gallbladder: Echogenic sludge within the gallbladder. Gallstones up to 1.2 cm. Wall thickening up to 4 mm suggesting cholecystitis. However, the patient did not have a positive Murphy sign. Common bile duct: Diameter: 6 mm.  No definite ductal stone. Liver: Parenchyma appears normal in echogenicity. I think there is mild intrahepatic ductal dilatation. IMPRESSION: Findings worrisome for cholecystitis. Stones and echogenic sludge in the gallbladder. Gallbladder wall thickening. No Murphy sign however. Mild biliary ductal prominence raises the possibility of a common duct stone as well. This was not specifically visualized. Electronically Signed   By: Nelson Chimes M.D.   On: 09/16/2016 23:02   Dg Abd  2 Views  Result Date: 09/16/2016 CLINICAL DATA:  Abdominal pain. EXAM: ABDOMEN - 2 VIEW COMPARISON:  No recent. FINDINGS: Soft tissue structures are unremarkable. No bowel distention. Moderate stool volume . No free air. Stool noted throughout the colon. Degenerative changes thoracolumbar spine with scoliosis concave left. Pelvic calcifications consistent phleboliths. Calcified fibroids may be present. Pessary noted. IMPRESSION: Moderate stool volume. Constipation cannot be excluded. No acute abnormality identified. Electronically Signed   By: Marcello Moores  Register   On: 09/16/2016  15:10    Anti-infectives: Anti-infectives    Start     Dose/Rate Route Frequency Ordered Stop   09/17/16 0700  cefTRIAXone (ROCEPHIN) 2 g in dextrose 5 % 50 mL IVPB     2 g 100 mL/hr over 30 Minutes Intravenous Every 24 hours 09/17/16 6269         Assessment/Plan Gallstone pancreatitis  - lipase 1,190 - abdominal tenderness resolved - recommend continuation of NPO due to significantly elevated lipase  Calculous cholecystitis  - RUQ U/S w/ stones, wall thickening, and sludge - WBC trending down, 10.9; continue IV abx  choledocholithiasis - total bilirubin 4.0 - possible ERCP today if clinically improving, per GI   FEN - NPO, IVF, hypoglycemia corrected ID - Rocphin  VTE - Lovenox   Plan - continue bowel rest, IV abx, ERCP today Laparoscopic cholecystectomy to be performed this hospital admission, possibly over the weekend if remains clinically improved and lipase decreased. We will continue to follow.   LOS: 1 day    [facility] 09/18/2016, 8:52 AM Pager: [phone] Consults: [phone] Mon-Fri 7:00 am-4:30 pm Sat-Sun 7:00 am-11:30 am

## 2016-09-18 NOTE — Progress Notes (Signed)
Hypoglycemic Event  CBG: 41  Treatment: D50 IV 50 mL  Symptoms: None  Follow-up CBG: Time:0758 CBG Result:159  Possible Reasons for Event: Other: Pt is NPO  Comments/MD notified:    Candace Gallus

## 2016-09-18 NOTE — Transfer of Care (Signed)
Immediate Anesthesia Transfer of Care Note  Patient: Margaret [name]  Procedure(s) Performed: Procedure(s): ENDOSCOPIC RETROGRADE CHOLANGIOPANCREATOGRAPHY (ERCP) (N/A)  Patient Location: Endoscopy Unit  Anesthesia Type:General  Level of Consciousness: awake, alert , oriented and patient cooperative  Airway & Oxygen Therapy: Patient Spontanous Breathing and Patient connected to nasal cannula oxygen  Post-op Assessment: Report given to RN, Post -op Vital signs reviewed and stable and Patient moving all extremities  Post vital signs: Reviewed and stable  Last Vitals:  Vitals:   09/18/16 1423 09/18/16 1458  BP: (!) 164/71   Pulse:  74  Resp: (!) 22   Temp: 37.1 C     Last Pain:  Vitals:   09/18/16 1423  TempSrc: Oral  PainSc:          Complications: No apparent anesthesia complications

## 2016-09-18 NOTE — Anesthesia Preprocedure Evaluation (Addendum)
Anesthesia Evaluation  Patient identified by MRN, date of birth, ID band Patient awake    Reviewed: Allergy & Precautions, NPO status , Patient's Chart, lab work & pertinent test results, reviewed documented beta blocker date and time   Airway Mallampati: I  TM Distance: >3 FB Neck ROM: Full    Dental  (+) Dental Advisory Given, Missing   Pulmonary former smoker,    Pulmonary exam normal breath sounds clear to auscultation       Cardiovascular hypertension, Pt. on home beta blockers and Pt. on medications Normal cardiovascular exam Rhythm:Regular Rate:Normal     Neuro/Psych  Neuromuscular disease negative psych ROS   GI/Hepatic GERD  Medicated,Acute gallstone pancreatitis   Endo/Other  negative endocrine ROSObesity   Renal/GU Renal InsufficiencyRenal disease     Musculoskeletal  (+) Arthritis , Osteoarthritis,    Abdominal   Peds negative pediatric ROS (+)  Hematology  (+) Blood dyscrasia, anemia ,   Anesthesia Other Findings Day of surgery medications reviewed with the patient.  Reproductive/Obstetrics negative OB ROS                           Anesthesia Physical Anesthesia Plan  ASA: II  Anesthesia Plan: General   Post-op Pain Management:    Induction: Intravenous  Airway Management Planned: Oral ETT  Additional Equipment:   Intra-op Plan:   Post-operative Plan: Extubation in OR  Informed Consent: I have reviewed the patients History and Physical, chart, labs and discussed the procedure including the risks, benefits and alternatives for the proposed anesthesia with the patient or authorized representative who has indicated his/her understanding and acceptance.   Dental advisory given  Plan Discussed with: CRNA  Anesthesia Plan Comments: (Risks/benefits of general anesthesia discussed with patient including risk of damage to teeth, lips, gum, and tongue, nausea/vomiting,  allergic reactions to medications, and the possibility of heart attack, stroke and death.  All patient questions answered.  Patient wishes to proceed.)        Anesthesia Quick Evaluation

## 2016-09-19 ENCOUNTER — Inpatient Hospital Stay (HOSPITAL_COMMUNITY): Payer: Medicare Other | Admitting: Certified Registered Nurse Anesthetist

## 2016-09-19 ENCOUNTER — Encounter (HOSPITAL_COMMUNITY)
Admission: EM | Disposition: A | Discharge status: Home / Self Care | Payer: Self-pay | Source: Home / Self Care | Attending: Internal Medicine

## 2016-09-19 ENCOUNTER — Encounter (HOSPITAL_COMMUNITY): Payer: Self-pay | Admitting: Certified Registered Nurse Anesthetist

## 2016-09-19 DIAGNOSIS — Z9049 Acquired absence of other specified parts of digestive tract: Secondary | ICD-10-CM

## 2016-09-19 HISTORY — PX: CHOLECYSTECTOMY: SHX55

## 2016-09-19 LAB — COMPREHENSIVE METABOLIC PANEL
ALBUMIN: 2.4 g/dL — AB (ref 3.5–5.0)
ALT: 90 U/L — ABNORMAL HIGH (ref 14–54)
ANION GAP: 9 (ref 5–15)
AST: 64 U/L — ABNORMAL HIGH (ref 15–41)
Alkaline Phosphatase: 145 U/L — ABNORMAL HIGH (ref 38–126)
BUN: 15 mg/dL (ref 6–20)
CHLORIDE: 105 mmol/L (ref 101–111)
CO2: 25 mmol/L (ref 22–32)
Calcium: 8.8 mg/dL — ABNORMAL LOW (ref 8.9–10.3)
Creatinine, Ser: 1.09 mg/dL — ABNORMAL HIGH (ref 0.44–1.00)
GFR calc Af Amer: 56 mL/min — ABNORMAL LOW (ref >60)
GFR calc non Af Amer: 48 mL/min — ABNORMAL LOW (ref >60)
GLUCOSE: 90 mg/dL (ref 65–99)
POTASSIUM: 3.9 mmol/L (ref 3.5–5.1)
SODIUM: 139 mmol/L (ref 135–145)
Total Bilirubin: 3.1 mg/dL — ABNORMAL HIGH (ref 0.3–1.2)
Total Protein: 5.8 g/dL — ABNORMAL LOW (ref 6.5–8.1)

## 2016-09-19 LAB — CBC
HEMATOCRIT: 36.4 % (ref 36.0–46.0)
HEMOGLOBIN: 11.8 g/dL — AB (ref 12.0–15.0)
MCH: 30.3 pg (ref 26.0–34.0)
MCHC: 32.4 g/dL (ref 30.0–36.0)
MCV: 93.3 fL (ref 78.0–100.0)
Platelets: 297 10*3/uL (ref 150–400)
RBC: 3.9 MIL/uL (ref 3.87–5.11)
RDW: 14.9 % (ref 11.5–15.5)
WBC: 8.5 10*3/uL (ref 4.0–10.5)

## 2016-09-19 LAB — GLUCOSE, CAPILLARY
GLUCOSE-CAPILLARY: 90 mg/dL (ref 65–99)
GLUCOSE-CAPILLARY: 180 mg/dL — AB (ref 65–99)
Glucose-Capillary: 103 mg/dL — ABNORMAL HIGH (ref 65–99)
Glucose-Capillary: 90 mg/dL (ref 65–99)

## 2016-09-19 LAB — LIPASE, BLOOD: LIPASE: 124 U/L — AB (ref 11–51)

## 2016-09-19 SURGERY — LAPAROSCOPIC CHOLECYSTECTOMY WITH INTRAOPERATIVE CHOLANGIOGRAM
Anesthesia: General | Site: Abdomen

## 2016-09-19 MED ORDER — HYDROMORPHONE HCL 1 MG/ML IJ SOLN
INTRAMUSCULAR | Status: AC
  Administered 2016-09-19: 0.5 mg via INTRAVENOUS
  Filled 2016-09-19: qty 0.5

## 2016-09-19 MED ORDER — MIDAZOLAM HCL 5 MG/5ML IJ SOLN
INTRAMUSCULAR | Status: DC | PRN
  Administered 2016-09-19: 2 mg via INTRAVENOUS

## 2016-09-19 MED ORDER — ROCURONIUM BROMIDE 100 MG/10ML IV SOLN
INTRAVENOUS | Status: DC | PRN
Start: 2016-09-19 — End: 2016-09-19
  Administered 2016-09-19: 50 mg via INTRAVENOUS

## 2016-09-19 MED ORDER — LACTATED RINGERS IV SOLN
INTRAVENOUS | Status: DC
  Administered 2016-09-19: 10:00:00 via INTRAVENOUS

## 2016-09-19 MED ORDER — SODIUM CHLORIDE 0.9 % IR SOLN
Status: DC | PRN
  Administered 2016-09-19: 1000 mL

## 2016-09-19 MED ORDER — DEXAMETHASONE SODIUM PHOSPHATE 4 MG/ML IJ SOLN
INTRAMUSCULAR | Status: DC | PRN
  Administered 2016-09-19: 10 mg via INTRAVENOUS

## 2016-09-19 MED ORDER — FENTANYL CITRATE (PF) 100 MCG/2ML IJ SOLN: 25.0000 ug | INTRAMUSCULAR | Status: DC | PRN

## 2016-09-19 MED ORDER — ROCURONIUM BROMIDE 10 MG/ML (PF) SYRINGE
PREFILLED_SYRINGE | INTRAVENOUS | Status: AC
  Filled 2016-09-19: qty 5

## 2016-09-19 MED ORDER — LIDOCAINE 2% (20 MG/ML) 5 ML SYRINGE
INTRAMUSCULAR | Status: AC
  Filled 2016-09-19: qty 5

## 2016-09-19 MED ORDER — DEXAMETHASONE SODIUM PHOSPHATE 10 MG/ML IJ SOLN
INTRAMUSCULAR | Status: AC
  Filled 2016-09-19: qty 1

## 2016-09-19 MED ORDER — ONDANSETRON HCL 4 MG/2ML IJ SOLN
INTRAMUSCULAR | Status: DC | PRN
  Administered 2016-09-19: 4 mg via INTRAVENOUS

## 2016-09-19 MED ORDER — BUPIVACAINE HCL (PF) 0.25 % IJ SOLN
INTRAMUSCULAR | Status: DC | PRN
  Administered 2016-09-19: 15 mL

## 2016-09-19 MED ORDER — FENTANYL CITRATE (PF) 100 MCG/2ML IJ SOLN
INTRAMUSCULAR | Status: DC | PRN
  Administered 2016-09-19: 50 ug via INTRAVENOUS
  Administered 2016-09-19 (×3): 100 ug via INTRAVENOUS
  Administered 2016-09-19: 50 ug via INTRAVENOUS
  Administered 2016-09-19: 100 ug via INTRAVENOUS

## 2016-09-19 MED ORDER — SUGAMMADEX SODIUM 200 MG/2ML IV SOLN
INTRAVENOUS | Status: DC | PRN
  Administered 2016-09-19: 350 mg via INTRAVENOUS

## 2016-09-19 MED ORDER — FENTANYL CITRATE (PF) 250 MCG/5ML IJ SOLN
INTRAMUSCULAR | Status: AC
  Filled 2016-09-19: qty 5

## 2016-09-19 MED ORDER — HYDROMORPHONE HCL 1 MG/ML IJ SOLN
INTRAMUSCULAR | Status: AC
  Filled 2016-09-19: qty 0.5

## 2016-09-19 MED ORDER — PROPOFOL 10 MG/ML IV BOLUS
INTRAVENOUS | Status: DC | PRN
  Administered 2016-09-19: 100 mg via INTRAVENOUS

## 2016-09-19 MED ORDER — PROPOFOL 10 MG/ML IV BOLUS
INTRAVENOUS | Status: AC
  Filled 2016-09-19: qty 20

## 2016-09-19 MED ORDER — MIDAZOLAM HCL 2 MG/2ML IJ SOLN
INTRAMUSCULAR | Status: AC
  Filled 2016-09-19: qty 2

## 2016-09-19 MED ORDER — ONDANSETRON HCL 4 MG/2ML IJ SOLN: 4.0000 mg | Freq: Once | INTRAMUSCULAR | Status: DC | PRN

## 2016-09-19 MED ORDER — LIDOCAINE HCL (CARDIAC) 20 MG/ML IV SOLN
INTRAVENOUS | Status: DC | PRN
  Administered 2016-09-19: 60 mg via INTRAVENOUS

## 2016-09-19 MED ORDER — 0.9 % SODIUM CHLORIDE (POUR BTL) OPTIME
TOPICAL | Status: DC | PRN
Start: 2016-09-19 — End: 2016-09-19
  Administered 2016-09-19: 1000 mL

## 2016-09-19 MED ORDER — SUGAMMADEX SODIUM 500 MG/5ML IV SOLN
INTRAVENOUS | Status: AC
  Filled 2016-09-19: qty 5

## 2016-09-19 MED ORDER — BUPIVACAINE HCL (PF) 0.25 % IJ SOLN
INTRAMUSCULAR | Status: AC
  Filled 2016-09-19: qty 30

## 2016-09-19 MED ORDER — BUPIVACAINE-EPINEPHRINE 0.25% -1:200000 IJ SOLN: INTRAMUSCULAR | Status: DC | PRN

## 2016-09-19 MED ORDER — HYDROCODONE-ACETAMINOPHEN 5-325 MG PO TABS
1.0000 | ORAL_TABLET | ORAL | Status: DC | PRN
  Administered 2016-09-19 – 2016-09-20 (×2): 1 via ORAL
  Filled 2016-09-19 (×2): qty 1

## 2016-09-19 MED ORDER — HYDROMORPHONE HCL 1 MG/ML IJ SOLN
0.2500 mg | INTRAMUSCULAR | Status: DC | PRN
  Administered 2016-09-19 (×3): 0.5 mg via INTRAVENOUS

## 2016-09-19 MED ORDER — SODIUM CHLORIDE 0.9 % IV SOLN
INTRAVENOUS | Status: DC
  Administered 2016-09-19: 13:00:00 via INTRAVENOUS

## 2016-09-19 MED ORDER — ONDANSETRON HCL 4 MG/2ML IJ SOLN
INTRAMUSCULAR | Status: AC
  Filled 2016-09-19: qty 2

## 2016-09-19 SURGICAL SUPPLY — 40 items
APPLIER CLIP 5 13 M/L LIGAMAX5 (MISCELLANEOUS) ×2
BLADE CLIPPER SURG (BLADE) IMPLANT
CANISTER SUCT 3000ML PPV (MISCELLANEOUS) ×2 IMPLANT
CHLORAPREP W/TINT 26ML (MISCELLANEOUS) ×2 IMPLANT
CLIP APPLIE 5 13 M/L LIGAMAX5 (MISCELLANEOUS) ×1 IMPLANT
COVER MAYO STAND STRL (DRAPES) ×2 IMPLANT
COVER SURGICAL LIGHT HANDLE (MISCELLANEOUS) ×2 IMPLANT
DERMABOND ADVANCED (GAUZE/BANDAGES/DRESSINGS) ×1
DERMABOND ADVANCED .7 DNX12 (GAUZE/BANDAGES/DRESSINGS) ×1 IMPLANT
DEVICE TROCAR PUNCTURE CLOSURE (ENDOMECHANICALS) ×2 IMPLANT
DRAPE C-ARM 42X72 X-RAY (DRAPES) ×2 IMPLANT
ELECT REM PT RETURN 9FT ADLT (ELECTROSURGICAL) ×2
ELECTRODE REM PT RTRN 9FT ADLT (ELECTROSURGICAL) ×1 IMPLANT
GLOVE BIO SURGEON STRL SZ7 (GLOVE) ×2 IMPLANT
GLOVE BIOGEL PI IND STRL 7.5 (GLOVE) ×1 IMPLANT
GLOVE BIOGEL PI INDICATOR 7.5 (GLOVE) ×1
GOWN STRL REUS W/ TWL LRG LVL3 (GOWN DISPOSABLE) ×3 IMPLANT
GOWN STRL REUS W/TWL LRG LVL3 (GOWN DISPOSABLE) ×3
HEMOSTAT SNOW SURGICEL 2X4 (HEMOSTASIS) ×2 IMPLANT
KIT BASIN OR (CUSTOM PROCEDURE TRAY) ×2 IMPLANT
KIT ROOM TURNOVER OR (KITS) ×2 IMPLANT
NS IRRIG 1000ML POUR BTL (IV SOLUTION) ×2 IMPLANT
PAD ARMBOARD 7.5X6 YLW CONV (MISCELLANEOUS) ×2 IMPLANT
POUCH RETRIEVAL ECOSAC 10 (ENDOMECHANICALS) ×1 IMPLANT
POUCH RETRIEVAL ECOSAC 10MM (ENDOMECHANICALS) ×1
SCISSORS LAP 5X35 DISP (ENDOMECHANICALS) ×2 IMPLANT
SET CHOLANGIOGRAPH 5 50 .035 (SET/KITS/TRAYS/PACK) ×2 IMPLANT
SET IRRIG TUBING LAPAROSCOPIC (IRRIGATION / IRRIGATOR) ×2 IMPLANT
SLEEVE ENDOPATH XCEL 5M (ENDOMECHANICALS) ×4 IMPLANT
SPECIMEN JAR SMALL (MISCELLANEOUS) ×2 IMPLANT
STRIP CLOSURE SKIN 1/2X4 (GAUZE/BANDAGES/DRESSINGS) ×2 IMPLANT
SUT MNCRL AB 4-0 PS2 18 (SUTURE) ×2 IMPLANT
SUT VIC AB 2-0 UR6 27 (SUTURE) ×4 IMPLANT
SUT VICRYL 0 UR6 27IN ABS (SUTURE) ×2 IMPLANT
TOWEL OR 17X24 6PK STRL BLUE (TOWEL DISPOSABLE) ×2 IMPLANT
TOWEL OR 17X26 10 PK STRL BLUE (TOWEL DISPOSABLE) ×2 IMPLANT
TRAY LAPAROSCOPIC MC (CUSTOM PROCEDURE TRAY) ×2 IMPLANT
TROCAR XCEL BLUNT TIP 100MML (ENDOMECHANICALS) ×2 IMPLANT
TROCAR XCEL NON-BLD 5MMX100MML (ENDOMECHANICALS) ×2 IMPLANT
TUBING INSUFFLATION (TUBING) ×2 IMPLANT

## 2016-09-19 NOTE — Anesthesia Postprocedure Evaluation (Signed)
Anesthesia Post Note  Patient: [name]  Procedure(s) Performed: Procedure(s) (LRB): ENDOSCOPIC RETROGRADE CHOLANGIOPANCREATOGRAPHY (ERCP) (N/A)  Patient location during evaluation: PACU Anesthesia Type: General Level of consciousness: awake and alert Pain management: pain level controlled Vital Signs Assessment: post-procedure vital signs reviewed and stable Respiratory status: spontaneous breathing, nonlabored ventilation, respiratory function stable and patient connected to nasal cannula oxygen Cardiovascular status: blood pressure returned to baseline and stable Postop Assessment: no signs of nausea or vomiting Anesthetic complications: no       Last Vitals:  Vitals:   09/18/16 2218 09/19/16 0557  BP: 134/62 119/80  Pulse: 64 66  Resp: 18 18  Temp: 37 C 37.3 C    Last Pain:  Vitals:   09/18/16 2046  TempSrc:   PainSc: 0-No pain                 Catalina Gravel

## 2016-09-19 NOTE — Progress Notes (Signed)
PROGRESS NOTE   Margaret [name]  DGL:[id number]    DOB: [birth year]    DOA: 09/16/2016  PCP: Lamar Blinks, MD   I have briefly reviewed patients previous medical records in [facility].  Brief Narrative:  76 year old female with PMH of HTN on Maxzide, GERD, presented to the ED with abdominal pain and diagnosed with acute biliary pancreatitis and possibly acute cholecystitis. Admission labs: Lipase >300, AST 127, ALT 147, WBC 18.6. [facility] and general surgery consulted and planned for ERCP followed by cholecystectomy with IOC. Clinically improved. S/P Lap Choly 4/21. Likely DC 4/22.   Assessment & Plan:   Active Problems:   HTN (hypertension)   Osteopenia   GERD (gastroesophageal reflux disease)   Acute pancreatitis due to calculus of common bile duct   Acute gallstone pancreatitis   Calculus of gallbladder with acute cholecystitis and obstruction   LFT elevation   1. Acute biliary pancreatitis: History and lab work on admission as indicated above. Abdominal pain currently resolved. [facility] consultation appreciated >ERCP possibly 4/20 afternoon. Continue IV fluids and IV antibiotics/Rocephin. Trend lipase and LFTs> better. S/P ERCP and sphincterotomy 4/20 and Lap cholecystectomy 4/21. Full liquids and advance as tolerated. 2. 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 Possible DC in am. 3. Hypokalemia: Replaced. Magnesium 2.  4. GERD: Continue PPI and H2 blocker. 5. Acute kidney injury, mild: Resolved 6. Essential hypertension: Controlled. Continue metoprolol. Temporarily hold Maxide due to pancreatitis. 7. Constipation: Seen on x-ray. Bowel regimen. 8. Hypoglycemia: noted on am labs. Monitor CBG's. Changed IVF to D5. No futher low CBG's. 9. Anemia: Probably dilutional. Stable.   DVT prophylaxis: Lovenox Code Status: Full Family Communication: None at bedside Disposition: DC home when  medically improved and stable, and cleared by [facility], possibly 4/21   Consultants:  General surgery [facility]   Procedures:  ERCP and sphincterotomy 4/20 Lap choly 4/21  Antimicrobials:  IV ceftriaxone    Subjective: She was already in the OR when I came in to round this morning. Seen this afternoon after procedure. Doing well. Mild appropriate abdominal soreness. Denies any other complaints.   ROS: No chest pain, dyspnea, dysuria or urinary frequency. Anxious to return home in am.  Objective:  Vitals:   09/19/16 1130 09/19/16 1145 09/19/16 1154 09/19/16 1230  BP: (!) 168/81 (!) 156/77  (!) 152/81  Pulse: 73 62 62 64  Resp: 13 13 13 16   Temp: 97.5 F (36.4 C) 97.5 F (36.4 C)  98.4 F (36.9 C)  TempSrc:      SpO2: 100% 94% 96% 90%  Weight:      Height:        Examination:  General exam: Pleasant elderly female lying comfortably in bed.  Respiratory system: Clear to auscultation. Respiratory effort normal. Cardiovascular system: S1 & S2 heard, RRR. No JVD, murmurs, rubs, gallops or clicks. No pedal edema. Gastrointestinal system: Abdomen is nondistended, soft and nontender. No organomegaly or masses felt. Normal bowel sounds heard. Central nervous system: Alert and oriented. No focal neurological deficits. Extremities: Symmetric 5 x 5 power. Skin: No rashes, lesions or ulcers Psychiatry: Judgement and insight appear normal. Mood & affect appropriate.     Data Reviewed: I have personally reviewed following labs and imaging studies  CBC:  Recent Labs Lab 09/16/16 1208 09/17/16 0431 09/17/16 1234 09/18/16 0512 09/19/16 0617  WBC 18.6* 12.4* 12.1* 10.9* 8.5  NEUTROABS 16.8*  --   --   --   --  HGB 14.0 11.7* 12.1 11.9* 11.8*  HCT 41.9 35.6* 36.4 35.4* 36.4  MCV 93.3 92.2 92.4 93.2 93.3  PLT 275.0 241 249 260 517   Basic Metabolic Panel:  Recent Labs Lab 09/16/16 1208 09/17/16 0431 09/17/16 0808 09/18/16 0512 09/19/16 0617  NA 138 140  --  139 139    K 3.3* 2.8*  --  4.1 3.9  CL 99 104  --  108 105  CO2 28 24  --  20* 25  GLUCOSE 129* 80  --  47* 90  BUN 19 16  --  17 15  CREATININE 1.23* 1.12*  --  1.02* 1.09*  CALCIUM 9.5 8.5*  --  8.7* 8.8*  MG  --   --  2.0  --   --    Liver Function Tests:  Recent Labs Lab 09/16/16 1208 09/17/16 0431 09/18/16 0512 09/19/16 0617  AST 127* 95* 76* 64*  ALT 147* 117* 101* 90*  ALKPHOS 187* 160* 157* 145*  BILITOT 5.3* 5.0* 4.0* 3.1*  PROT 7.2 6.0* 6.2* 5.8*  ALBUMIN 3.8 2.6* 2.4* 2.4*   Coagulation Profile:  Recent Labs Lab 09/17/16 0431  INR 0.96   No results found for this or any previous visit (from the past 240 hour(s)).       Radiology Studies: Dg Ercp With Sphincterotomy  Result Date: 09/18/2016 CLINICAL DATA:  Sphincterotomy EXAM: ERCP TECHNIQUE: Multiple spot images obtained with the fluoroscopic device and submitted for interpretation post-procedure. FLUOROSCOPY TIME:  Fluoroscopy Time:  3 minutes and 16 seconds Radiation Exposure Index (if provided by the fluoroscopic device): Number of Acquired Spot Images: 5 COMPARISON:  None. FINDINGS: Contrast fills the biliary tree. The common bile duct is dilated. Sphincterotomy was performed. IMPRESSION: See above. These images were submitted for radiologic interpretation only. Please see the procedural report for the amount of contrast and the fluoroscopy time utilized. Electronically Signed   By: Marybelle Killings M.D.   On: 09/18/2016 16:26        Scheduled Meds: . calcium-vitamin D  1 tablet Oral BID WC  . famotidine  20 mg Oral Daily  . indomethacin  100 mg Rectal Once  . metoprolol succinate  25 mg Oral Daily  . pantoprazole  80 mg Oral Daily  . polyethylene glycol  17 g Oral Daily  . senna  1 tablet Oral Daily  . sodium chloride flush  3 mL Intravenous Q12H   Continuous Infusions: . sodium chloride 50 mL/hr at 09/19/16 1310     LOS: 2 days     Kaio Kuhlman, MD, FACP, FHM. [facility] Pager [phone]  [phone]  If 7PM-7AM, please contact night-coverage [email or web address] Password [facility] 09/19/2016, 5:38 PM

## 2016-09-19 NOTE — Progress Notes (Signed)
1 Day Post-Op  Subjective: No abd pain, ercp yesterday noted, ate full dinner without issues last night  Objective: Vital signs in last 24 hours: Temp:  [97.8 F (36.6 C)-99.2 F (37.3 C)] 99.2 F (37.3 C) (04/21 0557) Pulse Rate:  [64-88] 66 (04/21 0557) Resp:  [18-22] 18 (04/21 0557) BP: (119-164)/(62-80) 119/80 (04/21 0557) SpO2:  [94 %-100 %] 94 % (04/21 0557) Weight:  [76.2 kg (168 lb)] 76.2 kg (168 lb) (04/20 1423) Last BM Date: 09/18/16  Intake/Output from previous day: 04/20 0701 - 04/21 0700 In: 1817.1 [P.O.:240; I.V.:1477.1; IV Piggyback:100] Out: 0  Intake/Output this shift: No intake/output data recorded.  GI: soft nt nd  Lab Results:   Recent Labs  09/18/16 0512 09/19/16 0617  WBC 10.9* 8.5  HGB 11.9* 11.8*  HCT 35.4* 36.4  PLT 260 297   BMET  Recent Labs  09/18/16 0512 09/19/16 0617  NA 139 139  K 4.1 3.9  CL 108 105  CO2 20* 25  GLUCOSE 47* 90  BUN 17 15  CREATININE 1.02* 1.09*  CALCIUM 8.7* 8.8*   PT/INR  Recent Labs  09/17/16 0431  LABPROT 12.7  INR 0.96   ABG No results for input(s): PHART, HCO3 in the last 72 hours.  Invalid input(s): PCO2, PO2  Studies/Results: Dg Ercp With Sphincterotomy  Result Date: 09/18/2016 CLINICAL DATA:  Sphincterotomy EXAM: ERCP TECHNIQUE: Multiple spot images obtained with the fluoroscopic device and submitted for interpretation post-procedure. FLUOROSCOPY TIME:  Fluoroscopy Time:  3 minutes and 16 seconds Radiation Exposure Index (if provided by the fluoroscopic device): Number of Acquired Spot Images: 5 COMPARISON:  None. FINDINGS: Contrast fills the biliary tree. The common bile duct is dilated. Sphincterotomy was performed. IMPRESSION: See above. These images were submitted for radiologic interpretation only. Please see the procedural report for the amount of contrast and the fluoroscopy time utilized. Electronically Signed   By: Marybelle Killings M.D.   On: 09/18/2016 16:26     Anti-infectives: Anti-infectives    Start     Dose/Rate Route Frequency Ordered Stop   09/17/16 0700  cefTRIAXone (ROCEPHIN) 2 g in dextrose 5 % 50 mL IVPB     2 g 100 mL/hr over 30 Minutes Intravenous Every 24 hours 09/17/16 7654        Assessment/Plan: Resolved gs pancreatitis  Discussed indication for lap chole this admission. Will proceed this morning. I discussed the procedure in detail.   We discussed the risks and benefits of a laparoscopic cholecystectomy and possible cholangiogram including, but not limited to bleeding, infection, injury to surrounding structures such as the intestine or liver, bile leak, retained gallstones, need to convert to an open procedure, prolonged diarrhea, blood clots such as  DVT, common bile duct injury, anesthesia risks, and possible need for additional procedures.  The likelihood of improvement in symptoms and return to the patient's normal status is good. We discussed the typical post-operative recovery course.

## 2016-09-19 NOTE — Anesthesia Procedure Notes (Signed)
Procedure Name: Intubation Date/Time: 09/19/2016 9:57 AM Performed by: Ollen Bowl Pre-anesthesia Checklist: Patient identified, Emergency Drugs available, Suction available, Patient being monitored and Timeout performed Patient Re-evaluated:Patient Re-evaluated prior to inductionOxygen Delivery Method: Circle system utilized and Simple face mask Preoxygenation: Pre-oxygenation with 100% oxygen Intubation Type: IV induction Ventilation: Mask ventilation without difficulty Laryngoscope Size: Miller and 2 Grade View: Grade I Tube type: Oral Tube size: 7.5 mm Number of attempts: 1 Airway Equipment and Method: Patient positioned with wedge pillow and Stylet Placement Confirmation: ETT inserted through vocal cords under direct vision,  positive ETCO2 and breath sounds checked- equal and bilateral Secured at: 21 cm Tube secured with: Tape Dental Injury: Teeth and Oropharynx as per pre-operative assessment

## 2016-09-19 NOTE — Anesthesia Postprocedure Evaluation (Signed)
Anesthesia Post Note  Patient: MURLE HELLSTROM  Procedure(s) Performed: Procedure(s) (LRB): LAPAROSCOPIC CHOLECYSTECTOMY POSSIBLE INTRAOPERATIVE CHOLANGIOGRAM (N/A)  Patient location during evaluation: PACU Anesthesia Type: General Level of consciousness: awake and alert Pain management: pain level controlled Vital Signs Assessment: post-procedure vital signs reviewed and stable Respiratory status: spontaneous breathing, nonlabored ventilation, respiratory function stable and patient connected to nasal cannula oxygen Cardiovascular status: blood pressure returned to baseline and stable Postop Assessment: no signs of nausea or vomiting Anesthetic complications: no       Last Vitals:  Vitals:   09/19/16 1145 09/19/16 1154  BP: (!) 156/77   Pulse: 62 62  Resp: 13 13  Temp: 36.4 C     Last Pain:  Vitals:   09/19/16 1150  TempSrc:   PainSc: 3                  Mahin Guardia,W. EDMOND

## 2016-09-19 NOTE — Anesthesia Preprocedure Evaluation (Addendum)
Anesthesia Evaluation  Patient identified by MRN, date of birth, ID band Patient awake    Reviewed: Allergy & Precautions, H&P , NPO status , Patient's Chart, lab work & pertinent test results, reviewed documented beta blocker date and time   Airway Mallampati: II  TM Distance: >3 FB Neck ROM: Full    Dental no notable dental hx. (+) Teeth Intact, Dental Advisory Given   Pulmonary neg pulmonary ROS, former smoker,    Pulmonary exam normal breath sounds clear to auscultation       Cardiovascular hypertension, Pt. on medications and Pt. on home beta blockers  Rhythm:Regular Rate:Normal     Neuro/Psych negative neurological ROS  negative psych ROS   GI/Hepatic Neg liver ROS, GERD  Medicated and Controlled,  Endo/Other  negative endocrine ROS  Renal/GU negative Renal ROS  negative genitourinary   Musculoskeletal  (+) Arthritis , Osteoarthritis,    Abdominal   Peds  Hematology negative hematology ROS (+)   Anesthesia Other Findings   Reproductive/Obstetrics negative OB ROS                            Anesthesia Physical Anesthesia Plan  ASA: II  Anesthesia Plan: General   Post-op Pain Management:    Induction: Intravenous  Airway Management Planned: Oral ETT  Additional Equipment:   Intra-op Plan:   Post-operative Plan: Extubation in OR  Informed Consent: I have reviewed the patients History and Physical, chart, labs and discussed the procedure including the risks, benefits and alternatives for the proposed anesthesia with the patient or authorized representative who has indicated his/her understanding and acceptance.   Dental advisory given  Plan Discussed with: CRNA  Anesthesia Plan Comments:         Anesthesia Quick Evaluation

## 2016-09-19 NOTE — Op Note (Signed)
Preoperative diagnosis: gallstone pancreatitis Postoperative diagnosis: same as above, chronic cholecystitis Procedure: Laparoscopic cholecystectomy Surgeon: Dr. Serita Grammes Anesthesia: Gen. Specimens: gb to pathology Estimated blood loss: minimal Complications: None Drains: none Sponge count was correct at completion Disposition to recovery stable  Indications: This is 81 yof with recent gallstone pancreatitis that has resolved and has undergone ercp. She is asymptomatic today and I discussed lap chole prior to discharge.  Procedure: After informed consent was obtained the patient was taken to the operating room. She was givenantibiotics. SCDs were in place. She was placed undergeneral anesthesia without complication. Her abdomen was prepped and draped in the standard sterile surgical fashion. A surgical timeout was then performed.  I infiltrated marcaine below the umbilicus.  I grasped the fascia and entered this sharply. I then placed a 0 vicryl pursestring suture through the fascia.  I inserted a hasson trocar and insufflated the abdomen to 15 mm Hg pressure.   I then placed a 5 mm trocar in the epigastrium.2 5 mm trocars were placed in the right side of the abdomen. I grasped the gallbladder and retracted cephalad. The gallbladder had evidence of chronic cholecystitis.I was able to identify the critical view of safety. The gallbladder was very thickened and difficult to grasp consistent with cholecystitis. I then clipped the duct and divided it. The duct was viable. The clips traversed the duct. I did not do a cholangiogram as she had normal one yesterday with ercp.  I then clipped and divided the cystic artery.  I then removed the gallbladder from the liver bed. I did enter into it and spilled small amount of bile. I placed it in a bag and removed from the umbilicus.I obtained hemostasis.I placed apiece of Surgicel in the liver bed overlying this. I then removed the hasson  trocar  and closed this two 0 vicryl sutures using the endoclose device. I then removed the remaining trocars and these were closed with 4-0 Monocryl and glue. She tolerated this well be transferred to the recovery room.

## 2016-09-19 NOTE — Transfer of Care (Signed)
Immediate Anesthesia Transfer of Care Note  Patient: [name]  Procedure(s) Performed: Procedure(s): LAPAROSCOPIC CHOLECYSTECTOMY POSSIBLE INTRAOPERATIVE CHOLANGIOGRAM (N/A)  Patient Location: PACU  Anesthesia Type:General  Level of Consciousness: awake, alert  and oriented  Airway & Oxygen Therapy: Patient Spontanous Breathing and Patient connected to nasal cannula oxygen  Post-op Assessment: Report given to RN and Post -op Vital signs reviewed and stable  Post vital signs: Reviewed and stable  Last Vitals:  Vitals:   09/18/16 2218 09/19/16 0557  BP: 134/62 119/80  Pulse: 64 66  Resp: 18 18  Temp: 37 C 37.3 C    Last Pain:  Vitals:   09/18/16 2046  TempSrc:   PainSc: 0-No pain         Complications: No apparent anesthesia complications

## 2016-09-20 ENCOUNTER — Encounter (HOSPITAL_COMMUNITY): Payer: Self-pay | Admitting: General Surgery

## 2016-09-20 DIAGNOSIS — K811 Chronic cholecystitis: Secondary | ICD-10-CM

## 2016-09-20 LAB — GLUCOSE, CAPILLARY
GLUCOSE-CAPILLARY: 158 mg/dL — AB (ref 65–99)
Glucose-Capillary: 186 mg/dL — ABNORMAL HIGH (ref 65–99)

## 2016-09-20 MED ORDER — HYDROCODONE-ACETAMINOPHEN 5-325 MG PO TABS: 1.0000 | ORAL_TABLET | ORAL | 0 refills | Status: DC | PRN

## 2016-09-20 MED ORDER — DOCUSATE SODIUM 100 MG PO CAPS: 100.0000 mg | ORAL_CAPSULE | Freq: Two times a day (BID) | ORAL | 0 refills | Status: DC

## 2016-09-20 NOTE — Discharge Instructions (Signed)
[facility] ______[facility] LAPAROSCOPIC SURGERY: POST OP INSTRUCTIONS Always review your discharge instruction sheet given to you by the facility where your surgery was performed. IF YOU HAVE DISABILITY OR FAMILY LEAVE FORMS, YOU MUST BRING THEM TO THE OFFICE FOR PROCESSING.   DO NOT GIVE THEM TO YOUR DOCTOR.  1. A prescription for pain medication may be given to you upon discharge.  Take your pain medication as prescribed, if needed.  If narcotic pain medicine is not needed, then you may take acetaminophen (Tylenol) or ibuprofen (Advil) as needed. 2. Take your usually prescribed medications unless otherwise directed. 3. If you need a refill on your pain medication, please contact your pharmacy.  They will contact our office to request authorization. Prescriptions will not be filled after 5pm or on week-ends. 4. You should follow a light diet the first few days after arrival home, such as soup and crackers, etc.  Be sure to include lots of fluids daily. 5. Most patients will experience some swelling and bruising in the area of the incisions.  Ice packs will help.  Swelling and bruising can take several days to resolve.  6. It is common to experience some constipation if taking pain medication after surgery.  Increasing fluid intake and taking a stool softener (such as Colace) will usually help or prevent this problem from occurring.  A mild laxative (Milk of Magnesia or Miralax) should be taken according to package instructions if there are no bowel movements after 48 hours. 7. Unless discharge instructions indicate otherwise, you may remove your bandages 24-48 hours after surgery, and you may shower at that time.  You may have steri-strips (small skin tapes) in place directly over the incision.  These strips should be left on the skin for 7-10 days.  If your surgeon used skin glue on the incision, you may shower in 24 hours.  The glue will flake off over the next 2-3 weeks.  Any sutures  or staples will be removed at the office during your follow-up visit. 8. ACTIVITIES:  You may resume regular (light) daily activities beginning the next day--such as daily self-care, walking, climbing stairs--gradually increasing activities as tolerated.  You may have sexual intercourse when it is comfortable.  Refrain from any heavy lifting or straining until approved by your doctor. a. You may drive when you are no longer taking prescription pain medication, you can comfortably wear a seatbelt, and you can safely maneuver your car and apply brakes. b. RETURN TO WORK:  __________________________________________________________ 9. You should see your doctor in the office for a follow-up appointment approximately 2-3 weeks after your surgery.  Make sure that you call for this appointment within a day or two after you arrive home to insure a convenient appointment time. 10. OTHER INSTRUCTIONS: __________________________________________________________________________________________________________________________ __________________________________________________________________________________________________________________________ WHEN TO CALL YOUR DOCTOR: 1. Fever over 101.0 2. Inability to urinate 3. Continued bleeding from incision. 4. Increased pain, redness, or drainage from the incision. 5. Increasing abdominal pain  The clinic staff is available to answer your questions during regular business hours.  Please dont hesitate to call and ask to speak to one of the nurses for clinical concerns.  If you have a medical emergency, go to the nearest emergency room or call 911.  A surgeon from [facility] is always on call at the hospital. [address] [place], [place], [place]  [address] ? [address], [place], [place]   [address] [phone] ? [phone] ? FAX [phone] Web site:  [email or web address]    Additional Discharge Instructions  Please get  your medications reviewed and adjusted by your Primary MD.  Please request your Primary MD to go over all Hospital Tests and Procedure/Radiological results at the follow up, please get all Hospital records sent to your Prim MD by signing hospital release before you go home.  If you had Pneumonia of Lung problems at the Hospital: Please get a 2 view Chest X ray done in 6-8 weeks after hospital discharge or sooner if instructed by your Primary MD.  If you have Congestive Heart Failure: Please call your Cardiologist or Primary MD anytime you have any of the following symptoms:  1) 3 pound weight gain in 24 hours or 5 pounds in 1 week  2) shortness of breath, with or without a dry hacking cough  3) swelling in the hands, feet or stomach  4) if you have to sleep on extra pillows at night in order to breathe  Follow cardiac low salt diet and 1.5 lit/day fluid restriction.  If you have diabetes Accuchecks 4 times/day, Once in AM empty stomach and then before each meal. Log in all results and show them to your primary doctor at your next visit. If any glucose reading is under 80 or above 300 call your primary MD immediately.  If you have Seizure/Convulsions/Epilepsy: Please do not drive, operate heavy machinery, participate in activities at heights or participate in high speed sports until you have seen by Primary MD or a Neurologist and advised to do so again.  If you had Gastrointestinal Bleeding: Please ask your Primary MD to check a complete blood count within one week of discharge or at your next visit. Your endoscopic/colonoscopic biopsies that are pending at the time of discharge, will also need to followed by your Primary MD.  Get Medicines reviewed and adjusted. Please take all your medications with you for your next visit with your Primary MD  Please request your Primary MD to go over all hospital tests and procedure/radiological results at the follow up, please ask your Primary MD to  get all Hospital records sent to his/her office.  If you experience worsening of your admission symptoms, develop shortness of breath, life threatening emergency, suicidal or homicidal thoughts you must seek medical attention immediately by calling 911 or calling your MD immediately  if symptoms less severe.  You must read complete instructions/literature along with all the possible adverse reactions/side effects for all the Medicines you take and that have been prescribed to you. Take any new Medicines after you have completely understood and accpet all the possible adverse reactions/side effects.   Do not drive or operate heavy machinery when taking Pain medications.   Do not take more than prescribed Pain, Sleep and Anxiety Medications  Special Instructions: If you have smoked or chewed Tobacco  in the last 2 yrs please stop smoking, stop any regular Alcohol  and or any Recreational drug use.  Wear Seat belts while driving.  Please note You were cared for by a hospitalist during your hospital stay. If you have any questions about your discharge medications or the care you received while you were in the hospital after you are discharged, you can call the unit and asked to speak with the hospitalist on call if the hospitalist that took care of you is not available. Once you are discharged, your primary care physician will handle any further medical issues. Please note that NO REFILLS for any discharge medications will be authorized once  you are discharged, as it is imperative that you return to your primary care physician (or establish a relationship with a primary care physician if you do not have one) for your aftercare needs so that they can reassess your need for medications and monitor your lab values.  You can reach the hospitalist office at phone [phone] or fax [phone]   If you do not have a primary care physician, you can call [phone] for a physician referral.

## 2016-09-20 NOTE — Progress Notes (Signed)
Patient received and understood discharge instructions and any questions were answered. IV was DC'd and catheter was intact. Patient was discharged to home and son transported her. Patient received all belongings and prescription medications at time of discharge.

## 2016-09-20 NOTE — Discharge Summary (Signed)
Physician Discharge Summary  Margaret [name] EAV:[id number] DOB: May 04, 1941  PCP: Lamar Blinks, MD  Admit date: 09/16/2016 Discharge date: 09/20/2016  Recommendations for Outpatient Follow-up:  1. Dr. Silvestre Mesi, PCP in 5 days with repeat labs (CBC & CMP). [facility] in 3 weeks.  Home Health: None Equipment/Devices: None    Discharge Condition: Improved and stable  CODE STATUS: Full  Diet recommendation: Heart healthy diet.  Discharge Diagnoses:  Active Problems:   HTN (hypertension)   Osteopenia   GERD (gastroesophageal reflux disease)   Acute pancreatitis due to calculus of common bile duct   Acute gallstone pancreatitis   Calculus of gallbladder with acute cholecystitis and obstruction   LFT elevation   Brief Summary: 76 year old female with PMH of HTN on Maxzide, GERD, presented to the ED with abdominal pain and diagnosed with acute biliary pancreatitis and possibly acute cholecystitis. Admission labs: Lipase >300, AST 127, ALT 147, WBC 18.6. [facility] general surgery consulted and planned for ERCP followed by cholecystectomy with IOC. Clinically improved. S/P Lap Choly 4/21.    Assessment & Plan:  1. Acute biliary pancreatitis: History and lab work on admission as indicated above. Treated supportively with bowel rest, IV fluids, pain medications. [facility] was consulted and underwent ERCP and sphincterotomy 4/20 and Lap cholecystectomy 4/21. LFTs and lipase improved. Clinically improved. 2. 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 Surgery followed up this morning and have cleared her for discharge. Discussed with general surgeons. They will see her again in the office in a couple of weeks. Patient indicates that she prefers to take ibuprofen or Tylenol for pain. She was advised that if she were to take opioids, prior home benzodiazepines, she  should not drive and she verbalized understanding. 3. Hypokalemia: Replaced. Magnesium 2.  4. GERD: Continue H2 blocker. 5. Acute kidney injury, mild: Resolved 6. Essential hypertension: Controlled. Continue metoprolol. Temporarily held Maxide due to pancreatitis, will be resumed at discharge. 7. Constipation: Seen on x-ray. Bowel regimen. 8. Hypoglycemia: Likely secondary to no by mouth intake. Resolved. 9. Anemia: Probably dilutional. Stable.   Consultants:  General surgery  GI   Procedures:  ERCP and sphincterotomy 4/20 Lap choly 4/21   Discharge Instructions  Discharge Instructions    Call MD for:  difficulty breathing, headache or visual disturbances    Complete by:  As directed    Call MD for:  extreme fatigue    Complete by:  As directed    Call MD for:  persistant dizziness or light-headedness    Complete by:  As directed    Call MD for:  persistant nausea and vomiting    Complete by:  As directed    Call MD for:  redness, tenderness, or signs of infection (pain, swelling, redness, odor or green/yellow discharge around incision site)    Complete by:  As directed    Call MD for:  severe uncontrolled pain    Complete by:  As directed    Call MD for:  temperature >100.4    Complete by:  As directed    Diet - low sodium heart healthy    Complete by:  As directed    Increase activity slowly    Complete by:  As directed        Medication List    TAKE these medications   CALCIUM 600 + D 600-200 MG-UNIT Tabs Generic drug:  Calcium Carb-Cholecalciferol Take 1 tablet by mouth  2 (two) times daily.   diazepam 2 MG tablet Commonly known as:  VALIUM Take 1/2 or 1 twice a day as needed for anxiety   docusate sodium 100 MG capsule Commonly known as:  COLACE Take 1 capsule (100 mg total) by mouth 2 (two) times daily.   HYDROcodone-acetaminophen 5-325 MG tablet Commonly known as:  NORCO/VICODIN Take 1-2 tablets by mouth every 4 (four) hours as needed for  moderate pain.   metoprolol succinate 25 MG 24 hr tablet Commonly known as:  TOPROL-XL TAKE 1 TABLET BY MOUTH EVERY DAY   ranitidine 300 MG tablet Commonly known as:  ZANTAC Take 1 tablet (300 mg total) by mouth at bedtime.   triamterene-hydrochlorothiazide 37.5-25 MG tablet Commonly known as:  MAXZIDE-25 Take 1 tablet by mouth daily.      [facility]-up [facility] Follow up in 3 week(s).   Specialty:  General Surgery Contact information: [address] [place], [place]. Schedule an appointment as soon as possible for a visit in 5 day(s).   Specialty:  Family Medicine Why:  To be seen with repeat labs (CBC & CMP). Contact information: [address] [place] [address] [phone]          Allergies  Allergen Reactions  . Percocet [Oxycodone-Acetaminophen] Other (See Comments)    Pt. reports that the medication causes her to be light headed and pass out.      Procedures/Studies: US Abdomen Limited  Result Date: 09/16/2016 CLINICAL DATA:  Right upper quadrant abdominal pain and tenderness beginning today. Elevated white count. EXAM: US ABDOMEN LIMITED - RIGHT UPPER QUADRANT COMPARISON:  None. FINDINGS: Gallbladder: Echogenic sludge within the gallbladder. Gallstones up to 1.2 cm. Wall thickening up to 4 mm suggesting cholecystitis. However, the patient did not have a positive Murphy sign. Common bile duct: Diameter: 6 mm.  No definite ductal stone. Liver: Parenchyma appears normal in echogenicity. I think there is mild intrahepatic ductal dilatation. IMPRESSION: Findings worrisome for cholecystitis. Stones and echogenic sludge in the gallbladder. Gallbladder wall thickening. No Murphy sign however. Mild biliary ductal prominence raises the possibility of a common duct stone as well. This was not specifically visualized. Electronically Signed   By:  Nelson Chimes M.D.   On: 09/16/2016 23:02   Dg Ercp With Sphincterotomy  Result Date: 09/18/2016 CLINICAL DATA:  Sphincterotomy EXAM: ERCP TECHNIQUE: Multiple spot images obtained with the fluoroscopic device and submitted for interpretation post-procedure. FLUOROSCOPY TIME:  Fluoroscopy Time:  3 minutes and 16 seconds Radiation Exposure Index (if provided by the fluoroscopic device): Number of Acquired Spot Images: 5 COMPARISON:  None. FINDINGS: Contrast fills the biliary tree. The common bile duct is dilated. Sphincterotomy was performed. IMPRESSION: See above. These images were submitted for radiologic interpretation only. Please see the procedural report for the amount of contrast and the fluoroscopy time utilized. Electronically Signed   By: Marybelle Killings M.D.   On: 09/18/2016 16:26   Dg Abd 2 Views  Result Date: 09/16/2016 CLINICAL DATA:  Abdominal pain. EXAM: ABDOMEN - 2 VIEW COMPARISON:  No recent. FINDINGS: Soft tissue structures are unremarkable. No bowel distention. Moderate stool volume . No free air. Stool noted throughout the colon. Degenerative changes thoracolumbar spine with scoliosis concave left. Pelvic calcifications consistent phleboliths. Calcified fibroids may be present. Pessary noted. IMPRESSION: Moderate stool volume. Constipation cannot be excluded. No acute abnormality identified. Electronically Signed  By: Slaughter   On: 09/16/2016 15:10      Subjective: Seen this morning. Anxious to go home. Tolerated soft diet. Mild appropriate abdominal soreness at lab cholecystectomy site. No nausea or vomiting. Passing flatus. No BM. Seen ambulating with supervision this morning.  Discharge Exam:  Vitals:   09/19/16 1230 09/19/16 2000 09/20/16 0500 09/20/16 0920  BP: (!) 152/81 125/74 108/60 138/60  Pulse: 64 69 61   Resp: 16 18 18    Temp: 98.4 F (36.9 C) 98.2 F (36.8 C) 98.6 F (37 C)   TempSrc:  Oral Oral   SpO2: 90% 95% 98%   Weight:      Height:         General exam: Pleasant elderly female seen ambulating comfortably in her room.Marland Kitchen  Respiratory system: Clear to auscultation. Respiratory effort normal. Cardiovascular system: S1 & S2 heard, RRR. No JVD, murmurs, rubs, gallops or clicks. No pedal edema. Gastrointestinal system: Abdomen is nondistended, soft. Laparoscopic sites without acute findings and appropriate minimal tenderness around them. No organomegaly or masses felt. Normal bowel sounds heard. Central nervous system: Alert and oriented. No focal neurological deficits. Extremities: Symmetric 5 x 5 power. Skin: No rashes, lesions or ulcers Psychiatry: Judgement and insight appear normal. Mood & affect appropriate.     The results of significant diagnostics from this hospitalization (including imaging, microbiology, ancillary and laboratory) are listed below for reference.     Microbiology: No results found for this or any previous visit (from the past 240 hour(s)).   Labs: CBC:  Recent Labs Lab 09/16/16 1208 09/17/16 0431 09/17/16 1234 09/18/16 0512 09/19/16 0617  WBC 18.6* 12.4* 12.1* 10.9* 8.5  NEUTROABS 16.8*  --   --   --   --   HGB 14.0 11.7* 12.1 11.9* 11.8*  HCT 41.9 35.6* 36.4 35.4* 36.4  MCV 93.3 92.2 92.4 93.2 93.3  PLT 275.0 241 249 260 785   Basic Metabolic Panel:  Recent Labs Lab 09/16/16 1208 09/17/16 0431 09/17/16 0808 09/18/16 0512 09/19/16 0617  NA 138 140  --  139 139  K 3.3* 2.8*  --  4.1 3.9  CL 99 104  --  108 105  CO2 28 24  --  20* 25  GLUCOSE 129* 80  --  47* 90  BUN 19 16  --  17 15  CREATININE 1.23* 1.12*  --  1.02* 1.09*  CALCIUM 9.5 8.5*  --  8.7* 8.8*  MG  --   --  2.0  --   --    Liver Function Tests:  Recent Labs Lab 09/16/16 1208 09/17/16 0431 09/18/16 0512 09/19/16 0617  AST 127* 95* 76* 64*  ALT 147* 117* 101* 90*  ALKPHOS 187* 160* 157* 145*  BILITOT 5.3* 5.0* 4.0* 3.1*  PROT 7.2 6.0* 6.2* 5.8*  ALBUMIN 3.8 2.6* 2.4* 2.4*   CBG:  Recent Labs Lab  09/19/16 0020 09/19/16 0558 09/19/16 1222 09/19/16 1750 09/20/16 0009  GLUCAP 103* 90 90 180* 186*   Urinalysis    Component Value Date/Time   COLORURINE AMBER (A) 09/16/2016 2137   APPEARANCEUR HAZY (A) 09/16/2016 2137   LABSPEC 1.017 09/16/2016 2137   PHURINE 5.0 09/16/2016 2137   GLUCOSEU NEGATIVE 09/16/2016 2137   HGBUR SMALL (A) 09/16/2016 2137   BILIRUBINUR NEGATIVE 09/16/2016 2137   BILIRUBINUR negative 10/14/2015 [place] 09/16/2016 2137   PROTEINUR 30 (A) 09/16/2016 2137   UROBILINOGEN negative 10/14/2015 1618   NITRITE NEGATIVE 09/16/2016 2137  LEUKOCYTESUR NEGATIVE 09/16/2016 2137      Time coordinating discharge: Over 30 minutes  SIGNED:  Vernell Leep, MD, FACP, FHM. [facility] Pager [phone]  If 7PM-7AM, please contact night-coverage [email or web address] Password [facility] 09/20/2016, 10:53 AM

## 2016-09-20 NOTE — Progress Notes (Addendum)
1 Day Post-Op  Subjective: Slept well. Eating without nausea or abdominal pain. Surgical pain well controlled.   Objective: Vital signs in last 24 hours: Temp:  [97.5 F (36.4 C)-98.6 F (37 C)] 98.6 F (37 C) (04/22 0500) Pulse Rate:  [61-87] 61 (04/22 0500) Resp:  [12-18] 18 (04/22 0500) BP: (108-168)/(60-85) 138/60 (04/22 0920) SpO2:  [90 %-100 %] 98 % (04/22 0500) Last BM Date: 09/19/16  Intake/Output from previous day: 04/21 0701 - 04/22 0700 In: 1995 [P.O.:145; I.V.:1600] Out: 825 [Urine:800; Blood:25] Intake/Output this shift: Total I/O In: 5 [I.V.:5] Out: -   General appearance: alert and cooperative Resp: clear to auscultation bilaterally GI: soft, not distended, appropriate peri-incisional tenderness Incision/Wound: c/d/I with dermabond  Lab Results:   Recent Labs  09/18/16 0512 09/19/16 0617  WBC 10.9* 8.5  HGB 11.9* 11.8*  HCT 35.4* 36.4  PLT 260 297   BMET  Recent Labs  09/18/16 0512 09/19/16 0617  NA 139 139  K 4.1 3.9  CL 108 105  CO2 20* 25  GLUCOSE 47* 90  BUN 17 15  CREATININE 1.02* 1.09*  CALCIUM 8.7* 8.8*   PT/INR No results for input(s): LABPROT, INR in the last 72 hours. ABG No results for input(s): PHART, HCO3 in the last 72 hours.  Invalid input(s): PCO2, PO2  Studies/Results: Dg Ercp With Sphincterotomy  Result Date: 09/18/2016 CLINICAL DATA:  Sphincterotomy EXAM: ERCP TECHNIQUE: Multiple spot images obtained with the fluoroscopic device and submitted for interpretation post-procedure. FLUOROSCOPY TIME:  Fluoroscopy Time:  3 minutes and 16 seconds Radiation Exposure Index (if provided by the fluoroscopic device): Number of Acquired Spot Images: 5 COMPARISON:  None. FINDINGS: Contrast fills the biliary tree. The common bile duct is dilated. Sphincterotomy was performed. IMPRESSION: See above. These images were submitted for radiologic interpretation only. Please see the procedural report for the amount of contrast and the  fluoroscopy time utilized. Electronically Signed   By: Marybelle Killings M.D.   On: 09/18/2016 16:26    Anti-infectives: Anti-infectives    Start     Dose/Rate Route Frequency Ordered Stop   09/17/16 0700  cefTRIAXone (ROCEPHIN) 2 g in dextrose 5 % 50 mL IVPB  Status:  Discontinued     2 g 100 mL/hr over 30 Minutes Intravenous Every 24 hours 09/17/16 2202 09/19/16 1215      Assessment/Plan: s/p Procedure(s): LAPAROSCOPIC CHOLECYSTECTOMY POSSIBLE INTRAOPERATIVE CHOLANGIOGRAM (N/A) OK for discharge from surgical standpoint. Follow up with [facility] in 2-3 weeks  Pain rx in chart  LOS: 3 days    Clovis Riley 09/20/2016

## 2016-09-21 ENCOUNTER — Telehealth: Payer: Self-pay

## 2016-09-21 ENCOUNTER — Ambulatory Visit: Payer: No Typology Code available for payment source | Admitting: Family Medicine

## 2016-09-21 NOTE — Telephone Encounter (Signed)
09/21/16  Hospital Follow Up  Transition Care Management Follow-up Telephone Call  ADMISSION DATE: 09/16/16  DISCHARGE DATE: 09/20/16   How have you been since you were released from the hospital? Patient states she has had dizziness from Hydrocodone. States she will stop and see if she feels better. Will take Tylenol for pain.      Do you understand why you were in the hospital? YES   Do you understand the discharge instrcutions? Yes  Items Reviewed:  Medications reviewed: Reviewed with patient.  Allergies reviewed: Yes Percocet  Dietary changes reviewed:Low Sodium Heart Healthy  Referrals reviewed:[facility] 09/28/16   Functional Questionnaire:   Activities of Daily Living (ADLs):  No help needed at this time   Any transportation issues/concerns?: No   Any patient concerns? Missed 2 weeks of therapy while in hospital.   Confirmed importance and date/time of follow-up visits scheduled: Yes   Confirmed with patient if condition begins to worsen call PCP or go to the ER. Yes    Patient was given the [place] line [phone]: Yes

## 2016-09-21 NOTE — Consult Note (Signed)
           [facility] Primary Care Navigator  09/21/2016  Margaret [name] Dec 15, 1940 [id number]   [name] see patient earlier today at the bedside to identify possible discharge needs but she was alreadydischarged this weekend.  Patient was discharged home yesterday according to staff.  Primary care provider's office noted to have done a transition of care call today for follow-up.  For questions, please contact:  Dannielle Huh, BSN, RN- [facility] Primary Care Navigator  Telephone: [phone] [place]

## 2016-09-22 ENCOUNTER — Ambulatory Visit: Payer: Medicare Other | Admitting: Podiatry

## 2016-09-23 ENCOUNTER — Ambulatory Visit: Payer: No Typology Code available for payment source | Admitting: Obstetrics & Gynecology

## 2016-09-27 ENCOUNTER — Other Ambulatory Visit: Payer: Self-pay | Admitting: Family

## 2016-09-28 ENCOUNTER — Ambulatory Visit (INDEPENDENT_AMBULATORY_CARE_PROVIDER_SITE_OTHER): Payer: Medicare Other | Admitting: Family Medicine

## 2016-09-28 ENCOUNTER — Encounter: Payer: Self-pay | Admitting: Family Medicine

## 2016-09-28 ENCOUNTER — Other Ambulatory Visit: Payer: Self-pay | Admitting: Family Medicine

## 2016-09-28 VITALS — BP 122/82 | HR 89 | Temp 97.8°F | Ht 62.0 in | Wt 163.4 lb

## 2016-09-28 DIAGNOSIS — E876 Hypokalemia: Secondary | ICD-10-CM | POA: Diagnosis not present

## 2016-09-28 DIAGNOSIS — I10 Essential (primary) hypertension: Secondary | ICD-10-CM

## 2016-09-28 DIAGNOSIS — Z09 Encounter for follow-up examination after completed treatment for conditions other than malignant neoplasm: Secondary | ICD-10-CM

## 2016-09-28 DIAGNOSIS — Z8719 Personal history of other diseases of the digestive system: Secondary | ICD-10-CM

## 2016-09-28 LAB — COMPREHENSIVE METABOLIC PANEL
ALBUMIN: 3.9 g/dL (ref 3.5–5.2)
ALT: 251 U/L — AB (ref 0–35)
AST: 130 U/L — ABNORMAL HIGH (ref 0–37)
Alkaline Phosphatase: 182 U/L — ABNORMAL HIGH (ref 39–117)
BUN: 25 mg/dL — ABNORMAL HIGH (ref 6–23)
CALCIUM: 10 mg/dL (ref 8.4–10.5)
CO2: 29 meq/L (ref 19–32)
Chloride: 96 mEq/L (ref 96–112)
Creatinine, Ser: 1.14 mg/dL (ref 0.40–1.20)
GFR: 49.24 mL/min — ABNORMAL LOW (ref >60.00)
Glucose, Bld: 107 mg/dL — ABNORMAL HIGH (ref 70–99)
Potassium: 2.9 mEq/L — ABNORMAL LOW (ref 3.5–5.1)
Sodium: 137 mEq/L (ref 135–145)
Total Bilirubin: 2 mg/dL — ABNORMAL HIGH (ref 0.2–1.2)
Total Protein: 7.6 g/dL (ref 6.0–8.3)

## 2016-09-28 LAB — CBC
HCT: 37.8 % (ref 36.0–46.0)
Hemoglobin: 12.7 g/dL (ref 12.0–15.0)
MCHC: 33.7 g/dL (ref 30.0–36.0)
MCV: 93 fl (ref 78.0–100.0)
Platelets: 472 10*3/uL — ABNORMAL HIGH (ref 150.0–400.0)
RBC: 4.06 Mil/uL (ref 3.87–5.11)
RDW: 14.3 % (ref 11.5–15.5)
WBC: 10 10*3/uL (ref 4.0–10.5)

## 2016-09-28 LAB — LIPASE: LIPASE: 371 U/L — AB (ref 11.0–59.0)

## 2016-09-28 MED ORDER — POTASSIUM CHLORIDE CRYS ER 20 MEQ PO TBCR: 20.0000 meq | EXTENDED_RELEASE_TABLET | Freq: Two times a day (BID) | ORAL | 0 refills | Status: DC

## 2016-09-28 NOTE — Patient Instructions (Signed)
It was a pleasure to see you today- I'll be in touch with your labs asap. Assuming your labs look ok I think we are ok to have you go back on aleve as needed Please see me in abut 4 months for follow-up; sooner if you need me

## 2016-09-28 NOTE — Progress Notes (Addendum)
[place] at [facility] [address], [place], [place] [address] [phone]  Date:  09/28/2016   Name:  Margaret [name]   DOB:  09-12-1940   MRN:  [id number]  PCP:  Margaret Blinks, MD    Chief Complaint: Hospitalization Follow-up   History of Present Illness:  Margaret [name] is a 76 y.o. very pleasant female patient who presents with the following:  Here today for a hospital follow-up. She was seen here on 4/18 by Dr. Larose Kells with abd pain- labs were concerning so she was sent directly to the ER and was admitted She was in the hospital from 4/18 to 4/22 with acute biliary pancreatitis and acute cholecystitis.  She was treated surgically with an ERCP and lap chole and is doing much better.   Her husband is back home following his recent serious illness. He came home on 4/18- her daughter was there to take care of him while she was in the hospital.  Now they are at home together and are doing well.  Margaret [name] admits that she is so busy taking care of Margaret [name] that she has not had much time to think about herself.  However, her anxiety is really doing ok right now- staying busy seems to be helping her!   She never really had any concern about her gallbladder prior to now- never had any prodromal pain that she can recall  Eating just does not appeal to her but she does feel hungry- however especially in the morning the thought of food makes her sick.  She is drinking liquids and eating some later on in the day No fever or chills  Her abd pain is much better- a little sore from her operation but otherwise fine  She is feeling quite tired still - she has been under a lot of stress with her husband being sick prior to her own serious illness   She has lost a few lbs and her BP is on the low side today  Wt Readings from Last 3 Encounters:  09/28/16 163 lb 6.4 oz (74.1 kg)  09/18/16 168 lb (76.2 kg)  09/16/16 168 lb 2 oz (76.3 kg)   She  is using tylenol for now- would like to go back on aleve for aches and pains assuming her labs looks ok  BP Readings from Last 3 Encounters:  09/28/16 122/82  09/20/16 138/60  09/16/16 128/60   No fever or chills No diarrhea No vomiting  Patient Active Problem List   Diagnosis Date Noted  . Acute pancreatitis due to calculus of common bile duct 09/17/2016  . Acute gallstone pancreatitis   . Calculus of gallbladder with acute cholecystitis and obstruction   . LFT elevation   . Stress fracture 01/01/2016  . Insomnia 12/16/2015  . Peripheral neuropathy 10/18/2015  . Renal insufficiency 03/06/2015  . HTN (hypertension) 03/06/2015  . Osteopenia 03/06/2015  . GERD (gastroesophageal reflux disease) 03/06/2015  . Vaginal pessary present 03/06/2015    Past Medical History:  Diagnosis Date  . Arthritis   . History of healed stress fracture 2013   bilateral feet  . Hx of phlebitis    reports remote hx of "superfical blood clots" never anticoagulated  . Hypertension   . Osteopenia     Past Surgical History:  Procedure Laterality Date  . CHOLECYSTECTOMY N/A 09/19/2016   Procedure: LAPAROSCOPIC CHOLECYSTECTOMY POSSIBLE INTRAOPERATIVE CHOLANGIOGRAM;  Surgeon: Rolm Bookbinder, MD;  Location: [facility];  Service:  General;  Laterality: N/A;  . ERCP N/A 09/18/2016   Procedure: ENDOSCOPIC RETROGRADE CHOLANGIOPANCREATOGRAPHY (ERCP);  Surgeon: Doran Stabler, MD;  Location: [facility] ENDOSCOPY;  Service: Endoscopy;  Laterality: N/A;  . FOOT SURGERY     patient reports four foot surgeries  . FOOT SURGERY Left    bunion, hammer toe surgery  . FOOT SURGERY Right    shaved bunion, hammer toe correction  . KNEE ARTHROSCOPY Right 1997  . REPLACEMENT TOTAL KNEE Left   . 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 RETINAL DETACHMENT SURGERY    . RETINAL DETACHMENT SURGERY Left 2008  . THYROIDECTOMY, PARTIAL Right   . THYROIDECTOMY, PARTIAL  2005    Social History  Substance Use Topics  . Smoking  status: Former Smoker    Quit date: 06/02/1979  . Smokeless tobacco: Never Used  . Alcohol use 0.6 oz/week    1 Glasses of wine per week     Comment: Occ    Family History  Problem Relation Age of Onset  . Heart disease Mother   . Heart attack Mother   . Heart disease Father   . Diabetes Maternal Grandmother   . Cancer Paternal Grandfather     stomach    Allergies  Allergen Reactions  . Percocet [Oxycodone-Acetaminophen] Other (See Comments)    Pt. reports that the medication causes her to be light headed and pass out.    Medication list has been reviewed and updated.  Current Outpatient Prescriptions on File Prior to Visit  Medication Sig Dispense Refill  . Calcium Carb-Cholecalciferol (CALCIUM 600 + D) 600-200 MG-UNIT TABS Take 1 tablet by mouth 2 (two) times daily.    . diazepam (VALIUM) 2 MG tablet Take 1/2 or 1 twice a day as needed for anxiety 20 tablet 0  . docusate sodium (COLACE) 100 MG capsule Take 1 capsule (100 mg total) by mouth 2 (two) times daily. 60 capsule 0  . metoprolol succinate (TOPROL-XL) 25 MG 24 hr tablet TAKE 1 TABLET BY MOUTH EVERY DAY 30 tablet 0  . ranitidine (ZANTAC) 300 MG tablet Take 1 tablet (300 mg total) by mouth at bedtime. 90 tablet 0  . triamterene-hydrochlorothiazide (MAXZIDE-25) 37.5-25 MG tablet Take 1 tablet by mouth daily. 90 tablet 3   No current facility-administered medications on file prior to visit.     Review of Systems:  As per HPI- otherwise negative.   Physical Examination: Vitals:   09/28/16 1144  BP: 122/82  Pulse: 89  Temp: 97.8 F (36.6 C)   Vitals:   09/28/16 1144  Weight: 163 lb 6.4 oz (74.1 kg)  Height: 5\' 2"  (1.575 m)   Body mass index is 29.89 kg/m. Ideal Body Weight: Weight in (lb) to have BMI = 25: 136.4  GEN: WDWN, NAD, Non-toxic, A & O x 3, overweight, looks well HEENT: Atraumatic, Normocephalic. Neck supple. No masses, No LAD. Ears and Nose: No external deformity. CV: RRR, No M/G/R. No JVD.  No thrill. No extra heart sounds. PULM: CTA B, no wheezes, crackles, rhonchi. No retractions. No resp. distress. No accessory muscle use. ABD: S, NT, ND, +BS. No rebound. No HSM.  Healing wounds from recent laparoscopic operation- look fine EXTR: No c/c/e NEURO Normal gait.  PSYCH: Normally interactive. Conversant. Not depressed or anxious appearing.  Calm demeanor.    Assessment and Plan: History of pancreatitis - Plan: Comprehensive metabolic panel, CBC, Lipase  Essential hypertension  Here today for a hospital follow-up Will get labs for her today  to follow-up on her pancreatitis, elevated LFTs and ARI BP is under excellent control Will plan further follow- up pending labs.   Signed Margaret Blinks, MD  Received her labs-  Her LFTs and Lipase have gone up significantly since last check and her K is quite low.  Called pt to discuss at approx 7pm.  Counseled her that I will contact her GI doctor about her LFTs/ lipase and we will treat her hypokalemia with oral repletion for now.  She is on maxzide so consulted with pharmacist about [place].  Decided to halve her maxzide for now and give 60meq of K BID- will need to repeat her K level after about 3 doses   Meds ordered this encounter  Medications  . potassium chloride SA (K-DUR,KLOR-CON) 20 MEQ tablet    Sig: Take 1 tablet (20 mEq total) by mouth 2 (two) times daily.    Dispense:  30 tablet    Refill:  0     Results for orders placed or performed in visit on 09/28/16  Comprehensive metabolic panel  Result Value Ref Range   Sodium 137 135 - 145 mEq/L   Potassium 2.9 (L) 3.5 - 5.1 mEq/L   Chloride 96 96 - 112 mEq/L   CO2 29 19 - 32 mEq/L   Glucose, Bld 107 (H) 70 - 99 mg/dL   BUN 25 (H) 6 - 23 mg/dL   Creatinine, Ser 1.14 0.40 - 1.20 mg/dL   Total Bilirubin 2.0 (H) 0.2 - 1.2 mg/dL   Alkaline Phosphatase 182 (H) 39 - 117 U/L   AST 130 (H) 0 - 37 U/L   ALT 251 (H) 0 - 35 U/L   Total Protein 7.6 6.0 - 8.3 g/dL    Albumin 3.9 3.5 - 5.2 g/dL   Calcium 10.0 8.4 - 10.5 mg/dL   GFR 49.24 (L) >60.00 mL/min  CBC  Result Value Ref Range   WBC 10.0 4.0 - 10.5 K/uL   RBC 4.06 3.87 - 5.11 Mil/uL   Platelets 472.0 (H) 150.0 - 400.0 K/uL   Hemoglobin 12.7 12.0 - 15.0 g/dL   HCT 37.8 36.0 - 46.0 %   MCV 93.0 78.0 - 100.0 fl   MCHC 33.7 30.0 - 36.0 g/dL   RDW 14.3 11.5 - 15.5 %  Lipase  Result Value Ref Range   Lipase 371.0 (H) 11.0 - 59.0 U/L

## 2016-09-28 NOTE — Progress Notes (Signed)
Pre visit review using our clinic tool,if applicable. No additional management support is needed unless otherwise documented below in the visit note.  

## 2016-09-28 NOTE — Addendum Note (Signed)
Addended by: Lamar Blinks C on: 09/28/2016 08:16 PM   Modules accepted: Orders

## 2016-09-28 NOTE — Telephone Encounter (Signed)
Refill sent per [facility] refill protocol/SLS  

## 2016-09-29 ENCOUNTER — Telehealth: Payer: Self-pay

## 2016-09-29 NOTE — Telephone Encounter (Signed)
Spoke to Dr. Edilia Bo, she is wanting to get some input from Dr. Loletha Carrow on patient's abnormal lab work. She did send him a staff message, and did let her know that he was doing procedures all day today. I have also sent him a message.

## 2016-09-30 ENCOUNTER — Telehealth: Payer: Self-pay | Admitting: Family Medicine

## 2016-09-30 DIAGNOSIS — K851 Biliary acute pancreatitis without necrosis or infection: Secondary | ICD-10-CM

## 2016-09-30 NOTE — Telephone Encounter (Signed)
Spoke with pt yesterday- she was just going to take her first dose of potassium last night.  She will come in tomorrow (5/3) for repeat labs

## 2016-10-02 ENCOUNTER — Other Ambulatory Visit (INDEPENDENT_AMBULATORY_CARE_PROVIDER_SITE_OTHER): Payer: Medicare Other

## 2016-10-02 DIAGNOSIS — K851 Biliary acute pancreatitis without necrosis or infection: Secondary | ICD-10-CM | POA: Diagnosis not present

## 2016-10-02 NOTE — Addendum Note (Signed)
Addended by: Caffie Pinto on: 10/02/2016 02:43 PM   Modules accepted: Orders

## 2016-10-03 ENCOUNTER — Telehealth: Payer: Self-pay | Admitting: Family Medicine

## 2016-10-03 LAB — COMPREHENSIVE METABOLIC PANEL
ALBUMIN: 3.7 g/dL (ref 3.6–5.1)
ALT: 108 U/L — AB (ref 6–29)
AST: 45 U/L — AB (ref 10–35)
Alkaline Phosphatase: 154 U/L — ABNORMAL HIGH (ref 33–130)
BILIRUBIN TOTAL: 1.5 mg/dL — AB (ref 0.2–1.2)
BUN: 24 mg/dL (ref 7–25)
CALCIUM: 9.9 mg/dL (ref 8.6–10.4)
CO2: 27 mmol/L (ref 20–31)
Chloride: 101 mmol/L (ref 98–110)
Creat: 1.22 mg/dL — ABNORMAL HIGH (ref 0.60–0.93)
GLUCOSE: 117 mg/dL — AB (ref 65–99)
Potassium: 5.1 mmol/L (ref 3.5–5.3)
Sodium: 141 mmol/L (ref 135–146)
TOTAL PROTEIN: 6.4 g/dL (ref 6.1–8.1)

## 2016-10-03 LAB — LIPASE: Lipase: 223 U/L — ABNORMAL HIGH (ref 7–60)

## 2016-10-03 NOTE — Telephone Encounter (Signed)
Received call from [facility] regarding a "critical" lipase. Reviewed, looks improved from previous drawn 5 days prior. Other abn labs are actually improved from previous as well. No urgent management needed at this time. Will route to PCP for FYI only.

## 2016-10-03 NOTE — Telephone Encounter (Signed)
Thanks Merrilee Seashore- funny they didn't call me for a critical when the lipase was higher than it was yesterday!  Will follow-up with her

## 2016-10-04 ENCOUNTER — Telehealth: Payer: Self-pay | Admitting: Family Medicine

## 2016-10-04 DIAGNOSIS — K851 Biliary acute pancreatitis without necrosis or infection: Secondary | ICD-10-CM

## 2016-10-04 NOTE — Telephone Encounter (Signed)
Called her- she got my message and is pleased that her labs are trending in the right direction Asked her to come back in one week to repeat labs.  She has stopped her K supplement as per my instructions.  She is not sure if she needs to schedule follow-up with GI or just with general surgery- will find out for her

## 2016-10-05 ENCOUNTER — Encounter: Payer: Self-pay | Admitting: Family Medicine

## 2016-10-06 ENCOUNTER — Telehealth: Payer: Self-pay | Admitting: Family Medicine

## 2016-10-06 NOTE — Telephone Encounter (Signed)
°  Relation to pt: self Call back number:[phone]   Reason for call:  Patient requesting a refill of all medications, please note patient switched pharmacy [facility] [address] [place], [place] [address] [phone]

## 2016-10-07 ENCOUNTER — Other Ambulatory Visit: Payer: Self-pay | Admitting: Emergency Medicine

## 2016-10-09 ENCOUNTER — Other Ambulatory Visit: Payer: Self-pay | Admitting: Emergency Medicine

## 2016-10-09 DIAGNOSIS — R21 Rash and other nonspecific skin eruption: Secondary | ICD-10-CM

## 2016-10-09 DIAGNOSIS — F419 Anxiety disorder, unspecified: Secondary | ICD-10-CM

## 2016-10-09 DIAGNOSIS — I10 Essential (primary) hypertension: Secondary | ICD-10-CM

## 2016-10-09 MED ORDER — CALCIUM CARB-CHOLECALCIFEROL 600-200 MG-UNIT PO TABS
1.0000 | ORAL_TABLET | Freq: Two times a day (BID) | ORAL | 2 refills | Status: DC
Start: 2016-10-09 — End: 2018-05-02

## 2016-10-09 MED ORDER — DOCUSATE SODIUM 100 MG PO CAPS
100.0000 mg | ORAL_CAPSULE | Freq: Two times a day (BID) | ORAL | 0 refills | Status: AC
Start: 2016-10-09 — End: 2016-11-08

## 2016-10-09 MED ORDER — TRIAMTERENE-HCTZ 37.5-25 MG PO TABS: 1.0000 | ORAL_TABLET | Freq: Every day | ORAL | 3 refills | Status: DC

## 2016-10-09 MED ORDER — RANITIDINE HCL 300 MG PO TABS: 300.0000 mg | ORAL_TABLET | Freq: Every day | ORAL | 0 refills | Status: DC

## 2016-10-09 MED ORDER — METOPROLOL SUCCINATE ER 25 MG PO TB24: 25.0000 mg | ORAL_TABLET | Freq: Every day | ORAL | 0 refills | Status: DC

## 2016-10-09 MED ORDER — DOCUSATE SODIUM 100 MG PO CAPS: 100.0000 mg | ORAL_CAPSULE | Freq: Two times a day (BID) | ORAL | 0 refills | Status: DC

## 2016-10-09 MED ORDER — DIAZEPAM 2 MG PO TABS: ORAL_TABLET | ORAL | 0 refills | Status: DC

## 2016-10-09 NOTE — Telephone Encounter (Signed)
Refills sent to [facility] per pt request. Refill for controlled substance sent to provider for approval.

## 2016-10-09 NOTE — Telephone Encounter (Signed)
Requesting: diazepam (VALIUM) 2 MG tablet Contract: 03/17/16 no control substance contract sign, uds sample given, Last OV: 09/28/2016 Last Refill: 09/14/2016  Please Advise

## 2016-10-11 ENCOUNTER — Other Ambulatory Visit: Payer: Self-pay | Admitting: Family Medicine

## 2016-10-11 DIAGNOSIS — E876 Hypokalemia: Secondary | ICD-10-CM

## 2016-10-13 ENCOUNTER — Encounter: Payer: Self-pay | Admitting: Podiatry

## 2016-10-13 ENCOUNTER — Encounter: Payer: Self-pay | Admitting: Family Medicine

## 2016-10-13 ENCOUNTER — Ambulatory Visit (INDEPENDENT_AMBULATORY_CARE_PROVIDER_SITE_OTHER): Payer: Medicare Other | Admitting: Podiatry

## 2016-10-13 ENCOUNTER — Other Ambulatory Visit (INDEPENDENT_AMBULATORY_CARE_PROVIDER_SITE_OTHER): Payer: Medicare Other

## 2016-10-13 DIAGNOSIS — Q828 Other specified congenital malformations of skin: Secondary | ICD-10-CM | POA: Diagnosis not present

## 2016-10-13 DIAGNOSIS — M79675 Pain in left toe(s): Secondary | ICD-10-CM

## 2016-10-13 DIAGNOSIS — M79674 Pain in right toe(s): Secondary | ICD-10-CM

## 2016-10-13 DIAGNOSIS — B351 Tinea unguium: Secondary | ICD-10-CM

## 2016-10-13 DIAGNOSIS — K851 Biliary acute pancreatitis without necrosis or infection: Secondary | ICD-10-CM

## 2016-10-13 DIAGNOSIS — G629 Polyneuropathy, unspecified: Secondary | ICD-10-CM | POA: Diagnosis not present

## 2016-10-13 LAB — COMPREHENSIVE METABOLIC PANEL WITH GFR
ALT: 24 U/L (ref 0–35)
AST: 23 U/L (ref 0–37)
Albumin: 3.8 g/dL (ref 3.5–5.2)
Alkaline Phosphatase: 99 U/L (ref 39–117)
BUN: 19 mg/dL (ref 6–23)
CO2: 30 meq/L (ref 19–32)
Calcium: 9.6 mg/dL (ref 8.4–10.5)
Chloride: 104 meq/L (ref 96–112)
Creatinine, Ser: 1.11 mg/dL (ref 0.40–1.20)
GFR: 50.77 mL/min — ABNORMAL LOW
Glucose, Bld: 114 mg/dL — ABNORMAL HIGH (ref 70–99)
Potassium: 3.4 meq/L — ABNORMAL LOW (ref 3.5–5.1)
Sodium: 140 meq/L (ref 135–145)
Total Bilirubin: 0.7 mg/dL (ref 0.2–1.2)
Total Protein: 6.9 g/dL (ref 6.0–8.3)

## 2016-10-13 LAB — LIPASE: Lipase: 123 U/L — ABNORMAL HIGH (ref 11.0–59.0)

## 2016-10-13 NOTE — Progress Notes (Signed)
Subjective: Ms. Gassner presents the office today for concerns painful calluses on her left and right foot. Denies he swelling or redness or any drainage coming from the areas. She has a states that she has thick, elongated toenails that she is asking for retrimmed except for her big toes. Denies any swelling to the toenail site they're painful with pressure in shoes. Denies any systemic complaints such as fevers, chills, nausea, vomiting. No acute changes since last appointment, and no other complaints at this time.   Objective: AAO x3, NAD DP/PT pulses palpable bilaterally, CRT less than 3 seconds Thick, annular hyperkeratotic lesions x 2 to the right foot along the plantar medial arch. Also right foot sub-metatarsal 1. Upon debridement, no underlying ulceration, drainage, foreign body present and there are no signs of pinpoint bleeding or verruca. Tenderness to the lesions prior to debridement.  Nails are hypertrophic, dystrophic, brittle, discolored, elongated 10. No surrounding redness or drainage. Tenderness nails 2-5 bilaterally. No open lesions or pre-ulcerative lesions are identified today. No open lesions or pre-ulcerative lesions.  No pain with calf compression, swelling, warmth, erythema  Assessment: Porokertosis x3 right foot; symptomatic onychomycosis  Plan: -All treatment options discussed with the patient including all alternatives, risks, complications.  -Lesions were sharply debrided 3 without complications or bleeding.  -Nail sharply debrided 8 without complications or bleeding. -RTC in 4 weeks or sooner if needed.  -Patient encouraged to call the office with any questions, concerns, change in symptoms.   Celesta Gentile, DPM

## 2016-10-27 ENCOUNTER — Other Ambulatory Visit: Payer: Self-pay | Admitting: Family Medicine

## 2016-10-27 ENCOUNTER — Other Ambulatory Visit: Payer: Self-pay | Admitting: Family

## 2016-10-27 NOTE — Telephone Encounter (Signed)
Rx request Denied, note to pharmacy: Patient transfer of Care to Dr. [name] on 08/06/16; medication d/c as "no longer needed for PRN" on 09/17/16; pt will need to contact PCP/SLS 05/29

## 2016-11-03 ENCOUNTER — Telehealth: Payer: Self-pay | Admitting: Family Medicine

## 2016-11-03 NOTE — Telephone Encounter (Signed)
Caller name:Aubrei Verdell Relationship to patient: Can be [phone] [facility]  Reason for call:Requesting refill on omeprazole 20mg 

## 2016-11-04 ENCOUNTER — Encounter: Payer: Self-pay | Admitting: Family Medicine

## 2016-11-04 ENCOUNTER — Other Ambulatory Visit: Payer: Self-pay | Admitting: Emergency Medicine

## 2016-11-04 MED ORDER — OMEPRAZOLE 20 MG PO CPDR: 20.0000 mg | DELAYED_RELEASE_CAPSULE | Freq: Every day | ORAL | 3 refills | Status: DC

## 2016-11-04 NOTE — Telephone Encounter (Signed)
Pt requesting omeprazole 20 mg which is no longer on pt's med list. Please advise.

## 2016-11-10 ENCOUNTER — Encounter: Payer: Self-pay | Admitting: Podiatry

## 2016-11-10 ENCOUNTER — Ambulatory Visit (INDEPENDENT_AMBULATORY_CARE_PROVIDER_SITE_OTHER): Payer: Medicare Other | Admitting: Podiatry

## 2016-11-10 DIAGNOSIS — R52 Pain, unspecified: Secondary | ICD-10-CM | POA: Diagnosis not present

## 2016-11-10 DIAGNOSIS — Q828 Other specified congenital malformations of skin: Secondary | ICD-10-CM

## 2016-11-10 NOTE — Progress Notes (Signed)
Subjective: Margaret [name] presents the office today for concerns painful calluses on her left and right foot. She did have pedicures and plaster limits the calluses not as bad she states they're still thickened painful. Denies any swelling or redness or any drainage. No recent change no acute trauma since last appointment. Denies any systemic complaints such as fevers, chills, nausea, vomiting. No acute changes since last appointment, and no other complaints at this time.   Objective: AAO x3, NAD DP/PT pulses palpable bilaterally, CRT less than 3 seconds Thick, annular hyperkeratotic lesions x 2 to the right foot along the plantar medial arch. On the right foot sub-metatarsal 1 and along the arch there is also a callus. Upon debridement, no underlying ulceration, drainage, foreign body present and there are no signs of pinpoint bleeding or verruca. Tenderness to the lesions prior to debridement.  No open lesions or pre-ulcerative lesions are identified today. No open lesions or pre-ulcerative lesions.  No pain with calf compression, swelling, warmth, erythema  Assessment: Porokertosis x3 right foot; symptomatic onychomycosis  Plan: -All treatment options discussed with the patient including all alternatives, risks, complications.  -Lesions were sharply debrided 3 without complications or bleeding.  -RTC in 4 weeks or sooner if needed.  -Patient encouraged to call the office with any questions, concerns, change in symptoms.   Celesta Gentile, DPM

## 2016-11-22 ENCOUNTER — Other Ambulatory Visit: Payer: Self-pay | Admitting: Family Medicine

## 2016-11-23 ENCOUNTER — Telehealth: Payer: Self-pay | Admitting: Family Medicine

## 2016-11-23 DIAGNOSIS — F419 Anxiety disorder, unspecified: Secondary | ICD-10-CM

## 2016-11-23 DIAGNOSIS — R21 Rash and other nonspecific skin eruption: Secondary | ICD-10-CM

## 2016-11-23 MED ORDER — DIAZEPAM 2 MG PO TABS: ORAL_TABLET | ORAL | 0 refills | Status: DC

## 2016-11-23 NOTE — Telephone Encounter (Signed)
Relation to pt: self Call back number:[phone] Pharmacy:  [facility] [place], [address] [phone] (Phone) [phone] (Fax)    Reason for call:  Patient requesting a refill ALPRAZolam (XANAX) 0.5 MG tablet

## 2016-11-23 NOTE — Telephone Encounter (Signed)
[facility]: filled ativan on 4/16, 4/02 Will refill for her today  I did refill this on 5/11 as well but this fill is not in [facility]

## 2016-11-24 NOTE — Telephone Encounter (Signed)
[facility], [facility] sent to [facility], see below

## 2016-11-25 ENCOUNTER — Other Ambulatory Visit: Payer: Self-pay | Admitting: Emergency Medicine

## 2016-11-25 DIAGNOSIS — F419 Anxiety disorder, unspecified: Secondary | ICD-10-CM

## 2016-11-25 DIAGNOSIS — R21 Rash and other nonspecific skin eruption: Secondary | ICD-10-CM

## 2016-11-25 MED ORDER — DIAZEPAM 2 MG PO TABS: ORAL_TABLET | ORAL | 0 refills | Status: DC

## 2016-11-25 NOTE — Telephone Encounter (Signed)
I just called it into the pharmacy a couple of hours again. Pt can recheck with the pharmacy but it has been called in.

## 2016-11-25 NOTE — Telephone Encounter (Signed)
Patient informed and will check with pharmacy, thank you

## 2016-11-25 NOTE — Telephone Encounter (Signed)
Patient would like to discuss Rx mentioned below, stating pharmacy never received,please advise best # [phone] (H)

## 2016-11-25 NOTE — Telephone Encounter (Signed)
Alprazolam is not currently on pt's med list. Spoke with Dr. [name] who verified that it's actually diazepam (VALIUM) 2 mg. Sent to [facility] as requested.

## 2016-11-25 NOTE — Telephone Encounter (Signed)
Please call in her rx to [facility] as requested. Thank you!

## 2016-11-27 ENCOUNTER — Telehealth: Payer: Self-pay | Admitting: Family Medicine

## 2016-11-27 NOTE — Telephone Encounter (Signed)
Called her and answered Q- she can use the valium for anxiety or sleep

## 2016-11-27 NOTE — Telephone Encounter (Signed)
Patient called asking about xanax she got the valium yesterday but would like to know if  the xanax was for sleeping.    Call  Back [phone]

## 2016-12-07 ENCOUNTER — Other Ambulatory Visit: Payer: Self-pay | Admitting: Family

## 2016-12-07 NOTE — Telephone Encounter (Signed)
Refill sent per pt request.  

## 2016-12-07 NOTE — Telephone Encounter (Signed)
Self.   Refill request for omeprazole    [facility] [place].

## 2016-12-08 ENCOUNTER — Telehealth: Payer: Self-pay | Admitting: *Deleted

## 2016-12-08 ENCOUNTER — Encounter: Payer: Self-pay | Admitting: Podiatry

## 2016-12-08 ENCOUNTER — Ambulatory Visit (INDEPENDENT_AMBULATORY_CARE_PROVIDER_SITE_OTHER): Payer: Medicare Other | Admitting: Podiatry

## 2016-12-08 DIAGNOSIS — G629 Polyneuropathy, unspecified: Secondary | ICD-10-CM

## 2016-12-08 DIAGNOSIS — Q828 Other specified congenital malformations of skin: Secondary | ICD-10-CM

## 2016-12-08 DIAGNOSIS — R52 Pain, unspecified: Secondary | ICD-10-CM | POA: Diagnosis not present

## 2016-12-08 MED ORDER — PREGABALIN 75 MG PO CAPS: 75.0000 mg | ORAL_CAPSULE | Freq: Two times a day (BID) | ORAL | 0 refills | Status: DC

## 2016-12-08 NOTE — Patient Instructions (Signed)
Pregabalin capsules What is this medicine? PREGABALIN (pre GAB a lin) is used to treat nerve pain from diabetes, shingles, spinal cord injury, and fibromyalgia. It is also used to control seizures in epilepsy. This medicine may be used for other purposes; ask your health care provider or pharmacist if you have questions. COMMON BRAND NAME(S): Lyrica What should I tell my health care provider before I take this medicine? They need to know if you have any of these conditions: -bleeding problems -heart disease, including heart failure -history of alcohol or drug abuse -kidney disease -suicidal thoughts, plans, or attempt; a previous suicide attempt by you or a family member -an unusual or allergic reaction to pregabalin, gabapentin, other medicines, foods, dyes, or preservatives -pregnant or trying to get pregnant or trying to conceive with your partner -breast-feeding How should I use this medicine? Take this medicine by mouth with a glass of water. Follow the directions on the prescription label. You can take this medicine with or without food. Take your doses at regular intervals. Do not take your medicine more often than directed. Do not stop taking except on your doctor's advice. A special MedGuide will be given to you by the pharmacist with each prescription and refill. Be sure to read this information carefully each time. Talk to your pediatrician regarding the use of this medicine in children. Special care may be needed. Overdosage: If you think you have taken too much of this medicine contact a poison control center or emergency room at once. NOTE: This medicine is only for you. Do not share this medicine with others. What if I miss a dose? If you miss a dose, take it as soon as you can. If it is almost time for your next dose, take only that dose. Do not take double or extra doses. What may interact with this medicine? -alcohol -certain medicines for blood pressure like captopril,  enalapril, or lisinopril -certain medicines for diabetes, like pioglitazone or rosiglitazone -certain medicines for anxiety or sleep -narcotic medicines for pain This list may not describe all possible interactions. Give your health care provider a list of all the medicines, herbs, non-prescription drugs, or dietary supplements you use. Also tell them if you smoke, drink alcohol, or use illegal drugs. Some items may interact with your medicine. What should I watch for while using this medicine? Tell your doctor or healthcare professional if your symptoms do not start to get better or if they get worse. Visit your doctor or health care professional for regular checks on your progress. Do not stop taking except on your doctor's advice. You may develop a severe reaction. Your doctor will tell you how much medicine to take. Wear a medical identification bracelet or chain if you are taking this medicine for seizures, and carry a card that describes your disease and details of your medicine and dosage times. You may get drowsy or dizzy. Do not drive, use machinery, or do anything that needs mental alertness until you know how this medicine affects you. Do not stand or sit up quickly, especially if you are an older patient. This reduces the risk of dizzy or fainting spells. Alcohol may interfere with the effect of this medicine. Avoid alcoholic drinks. If you have a heart condition, like congestive heart failure, and notice that you are retaining water and have swelling in your hands or feet, contact your health care provider immediately. The use of this medicine may increase the chance of suicidal thoughts or actions. Pay special attention   to how you are responding while on this medicine. Any worsening of mood, or thoughts of suicide or dying should be reported to your health care professional right away. This medicine has caused reduced sperm counts in some men. This may interfere with the ability to father a  child. You should talk to your doctor or health care professional if you are concerned about your fertility. Women who become pregnant while using this medicine for seizures may enroll in the [place] American Antiepileptic Drug Pregnancy Registry by calling 1-888-233-2334. This registry collects information about the safety of antiepileptic drug use during pregnancy. What side effects may I notice from receiving this medicine? Side effects that you should report to your doctor or health care professional as soon as possible: -allergic reactions like skin rash, itching or hives, swelling of the face, lips, or tongue -breathing problems -changes in vision -chest pain -confusion -jerking or unusual movements of any part of your body -loss of memory -muscle pain, tenderness, or weakness -suicidal thoughts or other mood changes -swelling of the ankles, feet, hands -unusual bruising or bleeding Side effects that usually do not require medical attention (report to your doctor or health care professional if they continue or are bothersome): -dizziness -drowsiness -dry mouth -headache -nausea -tremors -trouble sleeping -weight gain This list may not describe all possible side effects. Call your doctor for medical advice about side effects. You may report side effects to FDA at 1-800-FDA-1088. Where should I keep my medicine? Keep out of the reach of children. This medicine can be abused. Keep your medicine in a safe place to protect it from theft. Do not share this medicine with anyone. Selling or giving away this medicine is dangerous and against the law. This medicine may cause accidental overdose and death if it taken by other adults, children, or pets. Mix any unused medicine with a substance like cat litter or coffee grounds. Then throw the medicine away in a sealed container like a sealed bag or a coffee can with a lid. Do not use the medicine after the expiration date. Store at room  temperature between 15 and 30 degrees C (59 and 86 degrees F). NOTE: This sheet is a summary. It may not cover all possible information. If you have questions about this medicine, talk to your doctor, pharmacist, or health care provider.  2018 Elsevier/Gold Standard (2015-06-20 10:26:12)  

## 2016-12-08 NOTE — Telephone Encounter (Signed)
Called lyrica into [facility] today and quantity is 14 tablets and to take one by mouth twice daily. Margaret [name]

## 2016-12-09 NOTE — Progress Notes (Signed)
Subjective: Margaret [name] presents the office today for concerns painful calluses on her left and right foot. She states the calluses are hurting that she also states that she's get a lot of numbness and burning pain at night mostly. She proceeded tried gabapentin which not help and she is asking about trying Lyrica. This is been ongoing for quite some time but she will try Lyrica to see if this will help with her symptoms. Denies any systemic complaints such as fevers, chills, nausea, vomiting. No acute changes since last appointment, and no other complaints at this time.   Objective: AAO x3, NAD DP/PT pulses palpable bilaterally, CRT less than 3 seconds  Subjective she is getting burning, tingling pain to her feet mostly at nighttime. Thick, annular hyperkeratotic lesions x 2 to the right foot along the plantar medial arch. On the right foot sub-metatarsal 1 and along the arch there is also a callus. Upon debridement, no underlying ulceration, drainage, or signs of verruca. Tenderness to the lesions prior to debridement.  No open lesions or pre-ulcerative lesions are identified today. No open lesions or pre-ulcerative lesions.  No pain with calf compression, swelling, warmth, erythema  Assessment: Porokertosis x3 right foot; neuropathy   Plan: -All treatment options discussed with the patient including all alternatives, risks, complications.  -Lesions were sharply debrided 3 without complications or bleeding.  -Will try Lyrica. I gave her a prescription for this as well as a card for a free trial. Discussed other side effects the medication. If this helps we will continue the medication. -RTC in 4 weeks or sooner if needed.  -Patient encouraged to call the office with any questions, concerns, change in symptoms.   Celesta Gentile, DPM

## 2016-12-31 ENCOUNTER — Telehealth: Payer: Self-pay | Admitting: Family Medicine

## 2016-12-31 NOTE — Telephone Encounter (Signed)
Spoke with pt. Pt stated that she will be going out of town and would like to give office a call back to schedule her appt.

## 2017-01-04 ENCOUNTER — Other Ambulatory Visit: Payer: Self-pay | Admitting: Family Medicine

## 2017-01-05 ENCOUNTER — Encounter: Payer: Self-pay | Admitting: Podiatry

## 2017-01-05 ENCOUNTER — Ambulatory Visit (INDEPENDENT_AMBULATORY_CARE_PROVIDER_SITE_OTHER): Payer: Medicare Other | Admitting: Podiatry

## 2017-01-05 DIAGNOSIS — G629 Polyneuropathy, unspecified: Secondary | ICD-10-CM

## 2017-01-05 DIAGNOSIS — Q828 Other specified congenital malformations of skin: Secondary | ICD-10-CM

## 2017-01-05 NOTE — Progress Notes (Signed)
Subjective: Ms. Litaker presents the office today for concerns painful calluses on her left and right foot. She states they're starting to hurt again denies pain. They have been. Denies any redness or drainage or any swelling to the areas. She states the Lyrica is doing better in [place] takes [name] as needed. Denies any systemic complaints such as fevers, chills, nausea, vomiting. No acute changes since last appointment, and no other complaints at this time.   Objective: AAO x3, NAD DP/PT pulses palpable bilaterally, CRT less than 3 seconds  Subjective she is getting burning, tingling pain to her feet mostly at nighttime. Annular hyperkeratotic lesions x 2 to the right foot submetatarsal and arch. On the right foot sub-metatarsal 1 and along the arch there is also a callus. Upon debridement, no underlying ulceration, drainage, or signs of verruca. Tenderness to the lesions prior to debridement.  No open lesions or pre-ulcerative lesions are identified today. No open lesions or pre-ulcerative lesions.  No pain with calf compression, swelling, warmth, erythema  Assessment: Porokertosis x4 right foot; neuropathy   Plan: -All treatment options discussed with the patient including all alternatives, risks, complications.  -Lesions were sharply debrided 4 without complications or bleeding.  -Lyrica as needed -RTC in 4 weeks or sooner if needed.  -Patient encouraged to call the office with any questions, concerns, change in symptoms.   Celesta Gentile, DPM

## 2017-01-13 ENCOUNTER — Telehealth: Payer: Self-pay | Admitting: Podiatry

## 2017-01-13 ENCOUNTER — Other Ambulatory Visit: Payer: Self-pay

## 2017-01-13 NOTE — Telephone Encounter (Signed)
I am calling to get a refill on the special cream Dr. [name] had prescribed me. You can reach me at [phone].

## 2017-01-13 NOTE — Telephone Encounter (Signed)
Dr [name], I tried to look back on your notes to see what you prescribed?

## 2017-01-13 NOTE — Telephone Encounter (Signed)
We did a compound cream for neuropathy from [place] last year. I think that was the only cream I had given her (unless she purchased the urea cream for her calluses)neuropahty

## 2017-01-14 ENCOUNTER — Other Ambulatory Visit: Payer: Self-pay

## 2017-01-14 MED ORDER — NONFORMULARY OR COMPOUNDED ITEM: 1.0000 g | Freq: Three times a day (TID) | 2 refills | Status: DC

## 2017-01-14 NOTE — Telephone Encounter (Signed)
Pt called back about message she left yesterday regarding a refill request. Wants to know the status and would like a call back today.

## 2017-01-18 MED ORDER — PREGABALIN 75 MG PO CAPS: 75.0000 mg | ORAL_CAPSULE | Freq: Two times a day (BID) | ORAL | 5 refills | Status: DC

## 2017-01-18 NOTE — Telephone Encounter (Signed)
Dr. [name] ordered Lyrica 75mg  #60 one tablet bid +5refills. I informed pt Dr. [name] had refilled the Lyrica. Pt requested the rx be sent to [facility]. Faxed to [place].

## 2017-01-18 NOTE — Telephone Encounter (Signed)
Pt called requesting a refill of the Lyrica that Dr. [name] prescribed. Requested a call back today about the status of this refill request.

## 2017-01-19 ENCOUNTER — Other Ambulatory Visit: Payer: Self-pay | Admitting: Podiatry

## 2017-01-19 ENCOUNTER — Telehealth: Payer: Self-pay | Admitting: *Deleted

## 2017-01-19 NOTE — Telephone Encounter (Addendum)
Received refill request for lyrica. Left message to call with proper pharmacy, the rx had been faxed to the [id number].Pt call states send rx to [facility] on [address]. Called and faxed lyrica rx to [place].

## 2017-01-24 NOTE — Progress Notes (Signed)
Margaret [name] at [facility] [address], [place], [place] [address] [phone]  Date:  01/27/2017   Name:  Margaret [name]   DOB:  March 30, 1941   MRN:  [id number]  PCP:  Darreld Mclean, MD    Chief Complaint: Follow-up   History of Present Illness:  Margaret [name] is a 76 y.o. very pleasant female patient who presents with the following:  Here today for a follow-up visit- I last saw her in April after a recent hospital stay:  Here today for a hospital follow-up. She was seen here on 4/18 by Dr. Larose [name] with abd pain- labs were concerning so she was sent directly to the ER and was admitted She was in the hospital from 4/18 to 4/22 with acute biliary pancreatitis and acute cholecystitis.  She was treated surgically with an ERCP and lap chole and is doing much better.   Her husband is back home following his recent serious illness. He came home on 4/18- her daughter was there to take care of him while she was in the hospital.  Now they are at home together and are doing well.  Margaret [name] admits that she is so busy taking care of Margaret [name] that she has not had much time to think about herself.  However, her anxiety is really doing ok right now- staying busy seems to be helping her!   She never really had any concern about her gallbladder prior to now- never had any prodromal pain that she can recall  Eating just does not appeal to her but she does feel hungry- however especially in the morning the thought of food makes her sick.  She is drinking liquids and eating some later on in the day No fever or chills  Her abd pain is much better- a little sore from her operation but otherwise fine  She is feeling quite tired still - she has been under a lot of stress with her husband being sick prior to her own serious illness   She has lost a few lbs and her BP is on the low side today  She has also seen Dr. Jacqualyn [name] a few times recently for  her neuropathy  She notes that she is feeling ok, but she is under a lot of stress Her son- who is 86 years old- is struggling with drug addiction.  He had been in jail, and when he was released he moved to [facility].  He did ok for a while, but he then took up with a woman who is also a drug addict.   He has relapsed recently and they are very worried about him, expect to find him dead anytime Basically she needs some more of her diazepam due to all the stress  NCCSR: last filled diazepam in June of this year  She is on lyrica for her peripheral neuropathy She is feeling very anxious a lot of the time.  No SI but she is feeling down and would be interested in using something for depression right now as well Her son's behavior is also very hard on her husband who is not in good health to being with  Her son has been a worry for years, but the situation is acutely worse over the last several weeks   BP Readings from Last 3 Encounters:  01/27/17 117/79  09/28/16 122/82  09/20/16 138/60    Patient Active Problem List   Diagnosis Date Noted  .  Acute pancreatitis due to calculus of common bile duct 09/17/2016  . Acute gallstone pancreatitis   . Calculus of gallbladder with acute cholecystitis and obstruction   . LFT elevation   . Stress fracture 01/01/2016  . Insomnia 12/16/2015  . Peripheral neuropathy 10/18/2015  . Renal insufficiency 03/06/2015  . HTN (hypertension) 03/06/2015  . Osteopenia 03/06/2015  . GERD (gastroesophageal reflux disease) 03/06/2015  . Vaginal pessary present 03/06/2015    Past Medical History:  Diagnosis Date  . Arthritis   . History of healed stress fracture 2013   bilateral feet  . Hx of phlebitis    reports remote hx of "superfical blood clots" never anticoagulated  . Hypertension   . Osteopenia     Past Surgical History:  Procedure Laterality Date  . CHOLECYSTECTOMY N/A 09/19/2016   Procedure: LAPAROSCOPIC CHOLECYSTECTOMY POSSIBLE INTRAOPERATIVE  CHOLANGIOGRAM;  Surgeon: Margaret Bookbinder, MD;  Location: [facility];  Service: General;  Laterality: N/A;  . ERCP N/A 09/18/2016   Procedure: ENDOSCOPIC RETROGRADE CHOLANGIOPANCREATOGRAPHY (ERCP);  Surgeon: Margaret Stabler, MD;  Location: [facility] ENDOSCOPY;  Service: Endoscopy;  Laterality: N/A;  . FOOT SURGERY     patient reports four foot surgeries  . FOOT SURGERY Left    bunion, hammer toe surgery  . FOOT SURGERY Right    shaved bunion, hammer toe correction  . KNEE ARTHROSCOPY Right 1997  . REPLACEMENT TOTAL KNEE Left   . 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 RETINAL DETACHMENT SURGERY    . RETINAL DETACHMENT SURGERY Left 2008  . THYROIDECTOMY, PARTIAL Right   . THYROIDECTOMY, PARTIAL  2005    Social History  Substance Use Topics  . Smoking status: Former Smoker    Quit date: 06/02/1979  . Smokeless tobacco: Never Used  . Alcohol use 0.6 oz/week    1 Glasses of wine per week     Comment: Occ    Family History  Problem Relation Age of Onset  . Heart disease Mother   . Heart attack Mother   . Heart disease Father   . Diabetes Maternal Grandmother   . Cancer Paternal Grandfather        stomach    Allergies  Allergen Reactions  . Percocet [Oxycodone-Acetaminophen] Other (See Comments)    Pt. reports that the medication causes her to be light headed and pass out.    Medication list has been reviewed and updated.  Current Outpatient Prescriptions on File Prior to Visit  Medication Sig Dispense Refill  . Calcium Carb-Cholecalciferol (CALCIUM 600 + D) 600-200 MG-UNIT TABS Take 1 tablet by mouth 2 (two) times daily. 30 each 2  . diazepam (VALIUM) 2 MG tablet Take 1/2 or 1 twice a day as needed for anxiety 30 tablet 0  . LYRICA 75 MG capsule TAKE ONE CAPSULE BY MOUTH TWICE DAILY 14 capsule 0  . metoprolol succinate (TOPROL-XL) 25 MG 24 hr tablet TAKE 1 TABLET EVERY DAY 30 tablet 0  . NONFORMULARY OR COMPOUNDED ITEM Apply 1-2 g topically 3 (three) times daily. 120 each 2  .  omeprazole (PRILOSEC) 20 MG capsule Take 1 capsule (20 mg total) by mouth daily. 30 capsule 3  . triamterene-hydrochlorothiazide (MAXZIDE-25) 37.5-25 MG tablet Take 1 tablet by mouth daily. 90 tablet 3   No current facility-administered medications on file prior to visit.     Review of Systems:  As per HPI- otherwise negative. No fever, chills, nausea, vomiting, ST, cough, rash    Physical Examination: Vitals:   01/27/17 1117  BP: 117/79  Pulse: (!) 58  Temp: 97.7 F (36.5 C)  SpO2: 95%   Vitals:   01/27/17 1117  Weight: 167 lb 6.4 oz (75.9 kg)  Height: 5\' 2"  (1.575 m)   Body mass index is 30.62 kg/m. Ideal Body Weight: Weight in (lb) to have BMI = 25: 136.4  GEN: WDWN, NAD, Non-toxic, A & O x 3, overweight, appears her normal self HEENT: Atraumatic, Normocephalic. Neck supple. No masses, No LAD. Ears and Nose: No external deformity. CV: RRR, No M/G/R. No JVD. No thrill. No extra heart sounds. PULM: CTA B, no wheezes, crackles, rhonchi. No retractions. No resp. distress. No accessory muscle use. EXTR: No c/c/e NEURO Normal gait.  PSYCH: Normally interactive. Conversant. Not depressed or anxious appearing.  Calm demeanor.    Assessment and Plan: Anxiety - Plan: diazepam (VALIUM) 2 MG tablet, sertraline (ZOLOFT) 100 MG tablet  Rash - Plan: diazepam (VALIUM) 2 MG tablet  Stress due to family tension - Plan: diazepam (VALIUM) 2 MG tablet, sertraline (ZOLOFT) 100 MG tablet  Need for influenza vaccination  Here today to discuss some serious family stressors- namely her son is caught up in drug addiction Refilled her valium for her to use as needed Start on sertraline 50 mg- may increase to 100 mg in 2 weeks if she likes Recheck here in 4-6 weeks Flu shot today  Signed Lamar Blinks, MD

## 2017-01-27 ENCOUNTER — Ambulatory Visit (INDEPENDENT_AMBULATORY_CARE_PROVIDER_SITE_OTHER): Payer: Medicare Other | Admitting: Family Medicine

## 2017-01-27 VITALS — BP 117/79 | HR 58 | Temp 97.7°F | Ht 62.0 in | Wt 167.4 lb

## 2017-01-27 DIAGNOSIS — F419 Anxiety disorder, unspecified: Secondary | ICD-10-CM

## 2017-01-27 DIAGNOSIS — Z23 Encounter for immunization: Secondary | ICD-10-CM

## 2017-01-27 DIAGNOSIS — R21 Rash and other nonspecific skin eruption: Secondary | ICD-10-CM

## 2017-01-27 DIAGNOSIS — Z638 Other specified problems related to primary support group: Secondary | ICD-10-CM | POA: Diagnosis not present

## 2017-01-27 MED ORDER — DIAZEPAM 2 MG PO TABS: ORAL_TABLET | ORAL | 1 refills | Status: DC

## 2017-01-27 MED ORDER — SERTRALINE HCL 100 MG PO TABS: ORAL_TABLET | ORAL | 5 refills | Status: DC

## 2017-01-27 NOTE — Patient Instructions (Signed)
It was good to see you today-I am so sorry for what your family has been through!  Please let me know if you are not doing ok, or if you need anything We will refill your diazepam to use as needed.  I am also going to start you on zoloft (sertraline) 50 mg for general anxiety and depression  Please come and see me in 4-6 weeks for a recheck unless you are doing much better!!

## 2017-01-28 ENCOUNTER — Telehealth: Payer: Self-pay | Admitting: Family Medicine

## 2017-01-28 MED ORDER — METOPROLOL SUCCINATE ER 25 MG PO TB24: 25.0000 mg | ORAL_TABLET | Freq: Every day | ORAL | 3 refills | Status: DC

## 2017-01-28 NOTE — Telephone Encounter (Addendum)
°  Relation to BS:JGGE Call back [phone] Pharmacy: [facility] [place], [address] AT [address] [phone] (Phone) [phone] (Fax)     Reason for call:  Patient requesting a 90 day supply metoprolol succinate (TOPROL-XL) 25 MG 24 hr tablet

## 2017-02-02 ENCOUNTER — Ambulatory Visit (INDEPENDENT_AMBULATORY_CARE_PROVIDER_SITE_OTHER): Payer: Medicare Other | Admitting: Podiatry

## 2017-02-02 DIAGNOSIS — G629 Polyneuropathy, unspecified: Secondary | ICD-10-CM | POA: Diagnosis not present

## 2017-02-02 DIAGNOSIS — M722 Plantar fascial fibromatosis: Secondary | ICD-10-CM

## 2017-02-02 DIAGNOSIS — Q828 Other specified congenital malformations of skin: Secondary | ICD-10-CM

## 2017-02-02 NOTE — Patient Instructions (Signed)

## 2017-02-03 DIAGNOSIS — M722 Plantar fascial fibromatosis: Secondary | ICD-10-CM | POA: Insufficient documentation

## 2017-02-03 DIAGNOSIS — Q828 Other specified congenital malformations of skin: Secondary | ICD-10-CM | POA: Insufficient documentation

## 2017-02-03 NOTE — Progress Notes (Signed)
Subjective: Margaret [name] presents the office today for concerns painful calluses on her left and right foot. She states they're starting to hurt again denies pain. She did have to get a pedicure to get them shaved some. Denies any redness or drainage or any swelling to the areas. Still on Lyrica which is helping. She also states that she's been getting pain to the bottom of her right heel after being on her feet. She denies any recent injury or trauma denies he swelling or redness. Pain is intermittent. Denies any systemic complaints such as fevers, chills, nausea, vomiting. No acute changes since last appointment, and no other complaints at this time.   Objective: AAO x3, NAD DP/PT pulses palpable bilaterally, CRT less than 3 seconds  Annular hyperkeratotic lesions x 2 to the right foot submetatarsal and arch. On the right foot sub-metatarsal 1 and along the arch there is also a callus. Upon debridement, there is no underlying ulceration, drainage, or signs of verruca. Tenderness to the lesions prior to debridement.  No open lesions or pre-ulcerative lesions are identified today. No open lesions or pre-ulcerative lesions. There is tenderness of plantar aspect of the right heel on the insertion of plantar fascia and the calcaneus. There is no pain with lateral compression of the calcaneus. The plantar fascia appears to be intact. No pain to the Achilles tendon which also was intact. No other areas of tenderness.  No pain with calf compression, swelling, warmth, erythema  Assessment: Porokertosis x4 right foot; neuropathy; Plantar fasciitis  Plan: -All treatment options discussed with the patient including all alternatives, risks, complications.  -Lesions were sharply debrided 4 without complications or bleeding.  -Lyrica  -In regards to the heel pain discussed that the plantar fasciitis. Declined steroid injection. And inflammatory as needed. Discussed stretching, icing to the tear plantar  fascial braces dispensed. Discussed shoe gear modifications and orthotics. -RTC in 4 weeks or sooner if needed (at her request). Discussed that this may not be covered by insurance due to timeframe and an ABN was signed.  -Patient encouraged to call the office with any questions, concerns, change in symptoms.   Margaret [name], DPM

## 2017-02-10 ENCOUNTER — Ambulatory Visit: Payer: No Typology Code available for payment source | Admitting: Obstetrics & Gynecology

## 2017-02-16 ENCOUNTER — Other Ambulatory Visit: Payer: Self-pay | Admitting: Family Medicine

## 2017-02-16 ENCOUNTER — Other Ambulatory Visit: Payer: Self-pay | Admitting: Family

## 2017-02-16 DIAGNOSIS — Z1231 Encounter for screening mammogram for malignant neoplasm of breast: Secondary | ICD-10-CM

## 2017-03-04 ENCOUNTER — Telehealth: Payer: Self-pay | Admitting: Family Medicine

## 2017-03-04 ENCOUNTER — Other Ambulatory Visit: Payer: Self-pay | Admitting: Emergency Medicine

## 2017-03-04 MED ORDER — OMEPRAZOLE 20 MG PO CPDR: 20.0000 mg | DELAYED_RELEASE_CAPSULE | Freq: Every day | ORAL | 3 refills | Status: DC

## 2017-03-04 NOTE — Telephone Encounter (Signed)
Refill sent.

## 2017-03-04 NOTE — Telephone Encounter (Signed)
Patient needs a refill on:  omeprazole (PRILOSEC) 20 MG capsule    Send to:  [facility], [address] AT [facility] OF [address] [phone] (Phone) [phone] (Fax)

## 2017-03-05 ENCOUNTER — Other Ambulatory Visit: Payer: Self-pay | Admitting: Family Medicine

## 2017-03-08 ENCOUNTER — Ambulatory Visit: Payer: No Typology Code available for payment source | Admitting: Obstetrics & Gynecology

## 2017-03-09 ENCOUNTER — Ambulatory Visit (INDEPENDENT_AMBULATORY_CARE_PROVIDER_SITE_OTHER): Payer: Medicare Other | Admitting: Podiatry

## 2017-03-09 ENCOUNTER — Encounter: Payer: Self-pay | Admitting: Podiatry

## 2017-03-09 ENCOUNTER — Ambulatory Visit (HOSPITAL_BASED_OUTPATIENT_CLINIC_OR_DEPARTMENT_OTHER)
Admission: RE | Admit: 2017-03-09 | Discharge: 2017-03-09 | Disposition: A | Discharge status: Home / Self Care | Payer: Medicare Other | Source: Ambulatory Visit | Attending: Podiatry | Admitting: Podiatry

## 2017-03-09 DIAGNOSIS — B351 Tinea unguium: Secondary | ICD-10-CM | POA: Diagnosis not present

## 2017-03-09 DIAGNOSIS — M2011 Hallux valgus (acquired), right foot: Secondary | ICD-10-CM | POA: Insufficient documentation

## 2017-03-09 DIAGNOSIS — Q828 Other specified congenital malformations of skin: Secondary | ICD-10-CM

## 2017-03-09 DIAGNOSIS — M8430XA Stress fracture, unspecified site, initial encounter for fracture: Secondary | ICD-10-CM | POA: Diagnosis not present

## 2017-03-09 DIAGNOSIS — M85871 Other specified disorders of bone density and structure, right ankle and foot: Secondary | ICD-10-CM | POA: Insufficient documentation

## 2017-03-09 DIAGNOSIS — M79675 Pain in left toe(s): Secondary | ICD-10-CM | POA: Diagnosis not present

## 2017-03-09 DIAGNOSIS — M19071 Primary osteoarthritis, right ankle and foot: Secondary | ICD-10-CM | POA: Diagnosis not present

## 2017-03-09 DIAGNOSIS — G629 Polyneuropathy, unspecified: Secondary | ICD-10-CM

## 2017-03-09 DIAGNOSIS — M7989 Other specified soft tissue disorders: Secondary | ICD-10-CM | POA: Diagnosis not present

## 2017-03-09 DIAGNOSIS — M79674 Pain in right toe(s): Secondary | ICD-10-CM

## 2017-03-09 DIAGNOSIS — R52 Pain, unspecified: Secondary | ICD-10-CM

## 2017-03-10 ENCOUNTER — Telehealth: Payer: Self-pay | Admitting: *Deleted

## 2017-03-10 NOTE — Progress Notes (Signed)
Subjective: [name] presents the office today for concerns of painful calluses to both of her feet as well as for thick, elongated toenails to her toes. She states that that the calluses and nails are painful to pressure in shoes. She states that over the weekend she was doing more walking normally and she started getting pain the os calcis her right foot and she is concerned that she may have a stress fracture again. She denies any increase in swelling or redness from the area. She denies any recent injury or trauma. She said no recent treatment for this. Denies any systemic complaints such as fevers, chills, nausea, vomiting. No acute changes since last appointment, and no other complaints at this time.   Objective: AAO x3, NAD DP/PT pulses palpable bilaterally, CRT less than 3 seconds Prominent metatarsal heads plantarly with atrophy of the fat pad. 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 Hyperkeratotic lesions to the right foot submetatarsal area as well as the arch is well as the left foot submetatarsal 1. There is no underlying ulceration, drainage or any signs of infection. Nails 2 through 5 bilaterally are hypertrophic, dystrophic, discolored and there is tenderness nails 2 through 5 bilaterally. There is no surrounding edema, erythema, drainage or pus.  There is mild discomfort at the base of the fifth metatarsal on the right foot but more the tenderness is along the insertion of peroneal tendon. There is no pain vibratory sensation. There is no significant edema, erythema, increase in warmth. Peroneal tendon appears to be intact. No pain along the Achilles tendon. No other area of tenderness at this time. Adductus foot type is present. She has no pedal course or insertion the plantar fascia today. No open lesions or pre-ulcerative lesions.  No pain with calf compression, swelling, warmth, erythema  Assessment: 76 year old female symptomatic  hyperkeratotic lesions, onychomycosis, lateral stress fracture right foot  Plan: -All treatment options discussed with the patient including all alternatives, risks, complications.  -Hyperkeratotic lesions are sharply debrided 3 without any complications or bleeding -Nails are sharply debrided 8 without any complications or bleeding -I ordered an x-ray the right foot to rule out stress fracture. Ultimately I think that she is going to need a new custom molded orthotic for her shoes long-term given her significant deformity to her feet as well as osteoarthritis. She will consider this option. For now discussed the change in shoes as well as ice to the area given this is likely more tendinitis as opposed to stress fracture given her history we will get an x-ray. -RTC 4 weeks at her request for debridement.  -Patient encouraged to call the office with any questions, concerns, change in symptoms.   Celesta Gentile, DPM

## 2017-03-10 NOTE — Telephone Encounter (Addendum)
-----   Message from Trula Slade, DPM sent at 03/10/2017  7:07 AM EDT ----- Severe arthritis and bunion (she she already knows) but no evidence of fracture. Please let her know. Thanks. I informed pt of Dr. Leigh Aurora review of x-rays.

## 2017-03-29 ENCOUNTER — Ambulatory Visit (HOSPITAL_BASED_OUTPATIENT_CLINIC_OR_DEPARTMENT_OTHER)
Admission: RE | Admit: 2017-03-29 | Discharge: 2017-03-29 | Disposition: A | Discharge status: Home / Self Care | Payer: Medicare Other | Source: Ambulatory Visit | Attending: Family Medicine | Admitting: Family Medicine

## 2017-03-29 DIAGNOSIS — Z1231 Encounter for screening mammogram for malignant neoplasm of breast: Secondary | ICD-10-CM | POA: Insufficient documentation

## 2017-04-06 ENCOUNTER — Encounter: Payer: Self-pay | Admitting: Podiatry

## 2017-04-06 ENCOUNTER — Ambulatory Visit (INDEPENDENT_AMBULATORY_CARE_PROVIDER_SITE_OTHER): Payer: Medicare Other | Admitting: Podiatry

## 2017-04-06 DIAGNOSIS — Q828 Other specified congenital malformations of skin: Secondary | ICD-10-CM

## 2017-04-06 DIAGNOSIS — M2011 Hallux valgus (acquired), right foot: Secondary | ICD-10-CM

## 2017-04-06 DIAGNOSIS — G629 Polyneuropathy, unspecified: Secondary | ICD-10-CM | POA: Diagnosis not present

## 2017-04-06 NOTE — Progress Notes (Signed)
Subjective: [name] presents the office today for concerns of painful calluses to both of her feet. She denies any redness or drainage coming from the callus sites. She states the bunion on the right foot is been hurting quite a bit and she is asking there is any type of surgery that can be done to help decrease the pain and deformity to the bunion. She previously had a shave bunionectomy which is good for some time before the pain started to come back. She states that she cannot be nonweightbearing and she does not want to undergo a large surgery was asking if the area can be shaved down again. Denies any systemic complaints such as fevers, chills, nausea, vomiting. No acute changes since last appointment, and no other complaints at this time.   Objective: AAO x3, NAD DP/PT pulses palpable bilaterally, CRT less than 3 seconds Prominent metatarsal heads plantarly with atrophy of the fat pad. 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 Hyperkeratotic lesions to the right foot submetatarsal area as well as the arch is well as the left foot submetatarsal 1. There is no underlying ulceration, drainage or any signs of infection.  Midfoot pain present in the right foot. There is no area pinpoint any tenderness or pain the vibratory sensation. Bunion is present on the right foot. Appears to be a moderate bunion on clinical exam. There is decreased range of motion of the first MPJ and the hallux sits in a somewhat dorsiflexed position and overlapping second digit. There is tenderness on the bunion site has erythema from where it causes irritation from her shoes. There is no open sores identified. No open lesions or pre-ulcerative lesions.  No pain with calf compression, swelling, warmth, erythema  Assessment: 76 year old female symptomatic hyperkeratotic lesions, symptomatic HAV  Plan: -All treatment options discussed with the patient including all alternatives, risks,  complications.  -Hyperkeratotic lesions are sharply debrided 3 without any complications or bleeding -I discussed the potential surgical intervention for the right foot. She does not want to undergo a large surgery to correct the bunion. Discussed with her possible Keller bunionectomy. Discussed the surgery as well as the postoperative course. Discussed this is not a guarantee of resolution symptoms that can cause the symptoms to get worse but given the pain that she's been having she is going consider this to do after the first the year. -Follow-up in 4 weeks or sooner if needed.  Celesta Gentile, DPM

## 2017-04-14 ENCOUNTER — Ambulatory Visit (INDEPENDENT_AMBULATORY_CARE_PROVIDER_SITE_OTHER): Payer: Medicare Other | Admitting: Obstetrics & Gynecology

## 2017-04-14 ENCOUNTER — Encounter: Payer: Self-pay | Admitting: Obstetrics & Gynecology

## 2017-04-14 VITALS — BP 137/74 | HR 67 | Wt 172.0 lb

## 2017-04-14 DIAGNOSIS — N8111 Cystocele, midline: Secondary | ICD-10-CM

## 2017-04-14 NOTE — Progress Notes (Signed)
  HPI Pt presents today to have pessary removed and cleaned.  She denies problems but reports that something is still going to fall out. She has not felt that pessary.  She denies further complaints.  She denies leakage of urine.   Review of Systems: [place]     Objective:   Physical Exam Pt in NAD GYN: Pessary removed and cleaned; vaginal mucosa looks healty and with no excoriations or breakdown.  There was still prolapse of the bladder ant to the pessary.     PESSARY FITTING AND PLACEMENT During pelvic exam, patient was examined. Her vaginal introitus size, vaginal length, and prolapse stage wereused to guide selection of pessary type and size. After evaluation, a #5 inch ring fitting guide was placed and patient walked around room with it in place.  No prolapse was noted in standing, or in lithotomy position even after Valsalva. A #6 ring was also placed but, it led to continuous leakage of urine!  The fitter was removed and the pt originlal #4 pessary was replaced. .  Assessment:     Pessary check  Pessary refitting for cystocele.   Plan:   Will order #5 ring pessary.   F/u in 2 weeks to change out current pessary after new pessary arrives. F/u sooner prn   Total face-to-face time with patient was 64min.  Greater than 50% was spent in counseling and coordination of care with the patient.  Braxdon Gappa L. Harraway-Smith, M.D., [place]nn June

## 2017-04-21 ENCOUNTER — Telehealth: Payer: Self-pay

## 2017-04-26 NOTE — Telephone Encounter (Signed)
m °

## 2017-04-28 ENCOUNTER — Ambulatory Visit (INDEPENDENT_AMBULATORY_CARE_PROVIDER_SITE_OTHER): Payer: Medicare Other | Admitting: Obstetrics & Gynecology

## 2017-04-28 VITALS — BP 150/72 | HR 63 | Wt 172.0 lb

## 2017-04-28 DIAGNOSIS — N814 Uterovaginal prolapse, unspecified: Secondary | ICD-10-CM

## 2017-04-28 NOTE — Progress Notes (Signed)
  HPI Pt presents today to have new pessary placed.  She was having some leakage which she denies having since her last visit. She reports that her pessary is working very well now.  She passed out on Thanksgiving day and is scheduled to have cardiac testing done. She denies further complaints.        Objective:   Physical Exam Pt in [facility] G[facility]: Pessary removed. New pessary replaced. The vaginal mucosa looks healty and with no excoriations or breakdown     Assessment:     Pessary replacement. This pessary is actually the same size.      Plan:     F/u in 3 months to have pessary removed and cleaned F/u sooner prn   Total face-to-face time with patient was 15 min.  Greater than 50% was spent in counseling and coordination of care with the patient.    Salam Chesterfield L. Harraway-Smith, M.D., Cherlynn June

## 2017-04-29 ENCOUNTER — Encounter: Payer: Self-pay | Admitting: Obstetrics & Gynecology

## 2017-05-02 NOTE — Progress Notes (Addendum)
[facility] [address] [place], [place] [address] [phone]  Date:  05/03/2017   Name:  Margaret [name]   DOB:  1940-06-26   MRN:  [id number]  PCP:  Darreld Mclean, MD    Chief Complaint: Loss of Consciousness   History of Present Illness:  Margaret [name] is a 76 y.o. very pleasant female patient who presents with the following:  Last seen by myself in August:  She has also seen Dr. [name] a few times recently for her neuropathy  She notes that she is feeling ok, but she is under a lot of stress Her son- who is 28 years old- is struggling with drug addiction.  He had been in jail, and when he was released he moved to [facility].  He did ok for a while, but he then took up with a woman who is also a drug addict.   He has relapsed recently and they are very worried about him, expect to find him dead anytime Basically she needs some more of her diazepam due to all the stress  [facility]: last filled diazepam in June of this year  She is on lyrica for her peripheral neuropathy She is feeling very anxious a lot of the time.  No SI but she is feeling down and would be interested in using something for depression right now as well Her son's behavior is also very hard on her husband who is not in good health to being with  Her son has been a worry for years, but the situation is acutely worse over the last several weeks   Pt notes that the week of thanksgiving she was under a lot of stress.  She was hosting 21 people for a meal and was feeling very anxious. Her husband is not doing well health-wise either The morning of thanksgiving she took an anxiety pill (valium 2mg ) and then forgot about it- she then drank 2 glasses of wine.  At around 4pm she was cooking at the stove, started to feel lightheaded and had to sit down. Her daughter helped her to sit down in a chair and she passed out. She did not fall to the ground or get  hurt, no incontinence.  She thinks that she was out for a minute or less.  EMS came to check her out at home, she was feeling fine and they allowed her to stay home.   They did do an EKG at home-  She brings this in for me today.  It shows SR with down- going T waves in chest leads and III She feels fine now, has had no further episodes and seems to be back to normal No CP or SOB She admits to being still nervous and anxious.   BP Readings from Last 3 Encounters:  05/03/17 (!) 142/82  04/28/17 (!) 150/72  04/14/17 137/74     Patient Active Problem List   Diagnosis Date Noted  . Plantar fasciitis of right foot 02/03/2017  . Porokeratosis 02/03/2017  . Acute pancreatitis due to calculus of common bile duct 09/17/2016  . Acute gallstone pancreatitis   . Calculus of gallbladder with acute cholecystitis and obstruction   . LFT elevation   . Stress fracture 01/01/2016  . Insomnia 12/16/2015  . Peripheral neuropathy 10/18/2015  . Renal insufficiency 03/06/2015  . HTN (hypertension) 03/06/2015  . Osteopenia 03/06/2015  . GERD (gastroesophageal reflux disease) 03/06/2015  .  Vaginal pessary present 03/06/2015    Past Medical History:  Diagnosis Date  . Arthritis   . History of healed stress fracture 2013   bilateral feet  . Hx of phlebitis    reports remote hx of "superfical blood clots" never anticoagulated  . Hypertension   . Osteopenia     Past Surgical History:  Procedure Laterality Date  . CHOLECYSTECTOMY N/A 09/19/2016   Procedure: LAPAROSCOPIC CHOLECYSTECTOMY POSSIBLE INTRAOPERATIVE CHOLANGIOGRAM;  Surgeon: Rolm Bookbinder, MD;  Location: [facility];  Service: General;  Laterality: N/A;  . ERCP N/A 09/18/2016   Procedure: ENDOSCOPIC RETROGRADE CHOLANGIOPANCREATOGRAPHY (ERCP);  Surgeon: Doran Stabler, MD;  Location: [facility] ENDOSCOPY;  Service: Endoscopy;  Laterality: N/A;  . FOOT SURGERY     patient reports four foot surgeries  . FOOT SURGERY Left    bunion, hammer toe  surgery  . FOOT SURGERY Right    shaved bunion, hammer toe correction  . KNEE ARTHROSCOPY Right 1997  . REPLACEMENT TOTAL KNEE Left   . 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 RETINAL DETACHMENT SURGERY    . RETINAL DETACHMENT SURGERY Left 2008  . THYROIDECTOMY, PARTIAL Right   . THYROIDECTOMY, PARTIAL  2005    Social History   Tobacco Use  . Smoking status: Former Smoker    Last attempt to quit: 06/02/1979    Years since quitting: 37.9  . Smokeless tobacco: Never Used  Substance Use Topics  . Alcohol use: Yes    Alcohol/week: 0.6 oz    Types: 1 Glasses of wine per week    Comment: Occ  . Drug use: No    Family History  Problem Relation Age of Onset  . Heart disease Mother   . Heart attack Mother   . Heart disease Father   . Diabetes Maternal Grandmother   . Cancer Paternal Grandfather        stomach    Allergies  Allergen Reactions  . Percocet [Oxycodone-Acetaminophen] Other (See Comments)    Pt. reports that the medication causes her to be light headed and pass out.    Medication list has been reviewed and updated.  Current Outpatient Medications on File Prior to Visit  Medication Sig Dispense Refill  . Calcium Carb-Cholecalciferol (CALCIUM 600 + D) 600-200 MG-UNIT TABS Take 1 tablet by mouth 2 (two) times daily. 30 each 2  . diazepam (VALIUM) 2 MG tablet Take 1/2 or 1 twice a day as needed for anxiety 30 tablet 1  . LYRICA 75 MG capsule TAKE ONE CAPSULE BY MOUTH TWICE DAILY 14 capsule 0  . metoprolol succinate (TOPROL-XL) 25 MG 24 hr tablet Take 1 tablet (25 mg total) by mouth daily. 90 tablet 3  . NONFORMULARY OR COMPOUNDED ITEM Apply 1-2 g topically 3 (three) times daily. 120 each 2  . omeprazole (PRILOSEC) 20 MG capsule Take 1 capsule (20 mg total) by mouth daily. 30 capsule 3  . omeprazole (PRILOSEC) 20 MG capsule TAKE 1 CAPSULE BY MOUTH EVERY DAY AS NEEDED 90 capsule 0  . sertraline (ZOLOFT) 100 MG tablet Take 1/2 tablet daily- may increase to a whole tablet  after 2 weeks if desired 30 tablet 5  . triamterene-hydrochlorothiazide (MAXZIDE-25) 37.5-25 MG tablet Take 1 tablet by mouth daily. 90 tablet 3   No current facility-administered medications on file prior to visit.    BP Readings from Last 3 Encounters:  05/03/17 (!) 142/82  04/28/17 (!) 150/72  04/14/17 137/74     Review of Systems:  As per  HPI- otherwise negative. No fever or chills No nausea, vomiting or diarrhea   Physical Examination: Vitals:   05/03/17 1318  BP: (!) 142/82  Pulse: 97  Temp: 98 F (36.7 C)  SpO2: 96%   Vitals:   05/03/17 1318  Weight: 172 lb 6.4 oz (78.2 kg)   Body mass index is 31.53 kg/m. Ideal Body Weight:    GEN: WDWN, NAD, Non-toxic, A & O x 3, looks well HEENT: Atraumatic, Normocephalic. Neck supple. No masses, No LAD.  Bilateral TM wnl, oropharynx normal.  PEERL,EOMI.   Ears and Nose: No external deformity. CV: RRR, No M/G/R. No JVD. No thrill. No extra heart sounds. PULM: CTA B, no wheezes, crackles, rhonchi. No retractions. No resp. distress. No accessory muscle use. ABD: S, NT, ND, +BS. No rebound. No HSM. EXTR: No c/c/e NEURO Normal gait.  PSYCH: Normally interactive. Conversant. Not depressed or anxious appearing.  Calm demeanor.   EKG: SR, with negative T waves in chest leads and III.  I don't have an old EKG for comparison, but this is quite similar to EKG done by EMS on 04/22/17 Assessment and Plan: Syncope, unspecified syncope type - Plan: EKG 12-Lead, CBC, Hemoglobin A1c, Comprehensive metabolic panel, Troponin I  Elevated lipase - Plan: Lipase  Hyperglycemia - Plan: Hemoglobin A1c  Here today to discuss a syncopal episode which occurred about 2 weeks ago Etiology is not certain, but may have been caused by combination of valium and alcohol, followed by cooking over a hot stove.  Plan to obtain labs as above today and then consider an ETT for her.  She will let me know if any recurrent sx and knows not to combine her  valium with alcohol in the future   Signed Lamar Blinks, MD  Received her labs 12/4- message to pt  Your labs all look good- normal blood count, metabolic profile is ok, your pancreatic enzymes are normal, troponin test negative.  Your average blood sugar (hemoglobin a1c) is minimally elevated, but not to the level of diabetes.   Also, your kidney function (BUN) suggests that you may be a bit dehydrated.    I am very happy to look further into the cause of your fainting episode- we might start with a cardiac stress test.  This could be [facility] after the holidays if you like, assuming no further problems.  What do you think?  In any event, please come and see me for a visit in 3-4 months JC Results for orders placed or performed in visit on 05/03/17  CBC  Result Value Ref Range   WBC 7.0 4.0 - 10.5 K/uL   RBC 4.22 3.87 - 5.11 Mil/uL   Platelets 211.0 150.0 - 400.0 K/uL   Hemoglobin 13.5 12.0 - 15.0 g/dL   HCT 40.4 36.0 - 46.0 %   MCV 95.7 78.0 - 100.0 fl   MCHC 33.5 30.0 - 36.0 g/dL   RDW 14.0 11.5 - 15.5 %  Hemoglobin A1c  Result Value Ref Range   Hgb A1c MFr Bld 5.7 4.6 - 6.5 %  Comprehensive metabolic panel  Result Value Ref Range   Sodium 142 135 - 145 mEq/L   Potassium 3.9 3.5 - 5.1 mEq/L   Chloride 104 96 - 112 mEq/L   CO2 28 19 - 32 mEq/L   Glucose, Bld 91 70 - 99 mg/dL   BUN 33 (H) 6 - 23 mg/dL   Creatinine, Ser 1.10 0.40 - 1.20 mg/dL   Total Bilirubin 0.5 0.2 -  1.2 mg/dL   Alkaline Phosphatase 73 39 - 117 U/L   AST 21 0 - 37 U/L   ALT 14 0 - 35 U/L   Total Protein 6.9 6.0 - 8.3 g/dL   Albumin 4.0 3.5 - 5.2 g/dL   Calcium 9.9 8.4 - 10.5 mg/dL   GFR 51.23 (L) >60.00 mL/min  Lipase  Result Value Ref Range   Lipase 51.0 11.0 - 59.0 U/L  Troponin I  Result Value Ref Range   TNIDX 0.00 0.00 - 0.06 ug/l

## 2017-05-03 ENCOUNTER — Ambulatory Visit (INDEPENDENT_AMBULATORY_CARE_PROVIDER_SITE_OTHER): Payer: Medicare Other | Admitting: Family Medicine

## 2017-05-03 ENCOUNTER — Encounter: Payer: Self-pay | Admitting: Family Medicine

## 2017-05-03 VITALS — BP 142/82 | HR 97 | Temp 98.0°F | Wt 172.4 lb

## 2017-05-03 DIAGNOSIS — R739 Hyperglycemia, unspecified: Secondary | ICD-10-CM | POA: Diagnosis not present

## 2017-05-03 DIAGNOSIS — R748 Abnormal levels of other serum enzymes: Secondary | ICD-10-CM

## 2017-05-03 DIAGNOSIS — R55 Syncope and collapse: Secondary | ICD-10-CM | POA: Diagnosis not present

## 2017-05-03 LAB — CBC
HCT: 40.4 % (ref 36.0–46.0)
HEMOGLOBIN: 13.5 g/dL (ref 12.0–15.0)
MCHC: 33.5 g/dL (ref 30.0–36.0)
MCV: 95.7 fl (ref 78.0–100.0)
PLATELETS: 211 10*3/uL (ref 150.0–400.0)
RBC: 4.22 Mil/uL (ref 3.87–5.11)
RDW: 14 % (ref 11.5–15.5)
WBC: 7 10*3/uL (ref 4.0–10.5)

## 2017-05-03 LAB — HEMOGLOBIN A1C: Hgb A1c MFr Bld: 5.7 % (ref 4.6–6.5)

## 2017-05-03 LAB — TROPONIN I: TNIDX: 0 ug/L (ref 0.00–0.06)

## 2017-05-03 NOTE — Patient Instructions (Addendum)
It was good to see you again today- I am sorry that Margaret [name] has been so sick!    I will be in touch with your labs Please let me know if any recurrent symptoms or other concerns  We may decide to do a stress test for you after the holidays

## 2017-05-04 ENCOUNTER — Ambulatory Visit (INDEPENDENT_AMBULATORY_CARE_PROVIDER_SITE_OTHER): Payer: Medicare Other | Admitting: Podiatry

## 2017-05-04 ENCOUNTER — Encounter: Payer: Self-pay | Admitting: Family Medicine

## 2017-05-04 DIAGNOSIS — G629 Polyneuropathy, unspecified: Secondary | ICD-10-CM

## 2017-05-04 DIAGNOSIS — M79676 Pain in unspecified toe(s): Secondary | ICD-10-CM | POA: Diagnosis not present

## 2017-05-04 DIAGNOSIS — B351 Tinea unguium: Secondary | ICD-10-CM

## 2017-05-04 DIAGNOSIS — M79674 Pain in right toe(s): Secondary | ICD-10-CM

## 2017-05-04 DIAGNOSIS — M79675 Pain in left toe(s): Secondary | ICD-10-CM | POA: Diagnosis not present

## 2017-05-04 DIAGNOSIS — Q828 Other specified congenital malformations of skin: Secondary | ICD-10-CM

## 2017-05-04 LAB — COMPREHENSIVE METABOLIC PANEL
ALT: 14 U/L (ref 0–35)
AST: 21 U/L (ref 0–37)
Albumin: 4 g/dL (ref 3.5–5.2)
Alkaline Phosphatase: 73 U/L (ref 39–117)
BILIRUBIN TOTAL: 0.5 mg/dL (ref 0.2–1.2)
BUN: 33 mg/dL — AB (ref 6–23)
CALCIUM: 9.9 mg/dL (ref 8.4–10.5)
CHLORIDE: 104 meq/L (ref 96–112)
CO2: 28 meq/L (ref 19–32)
CREATININE: 1.1 mg/dL (ref 0.40–1.20)
GFR: 51.23 mL/min — ABNORMAL LOW (ref >60.00)
Glucose, Bld: 91 mg/dL (ref 70–99)
Potassium: 3.9 mEq/L (ref 3.5–5.1)
SODIUM: 142 meq/L (ref 135–145)
Total Protein: 6.9 g/dL (ref 6.0–8.3)

## 2017-05-04 LAB — LIPASE: Lipase: 51 U/L (ref 11.0–59.0)

## 2017-05-04 NOTE — Progress Notes (Signed)
Subjective: [name] presents the office today for concerns of painful calluses to both of her feet.  She states that they are very painful mostly with pressure in shoes.  She denies any recent injury or trauma to her feet she denies any swelling or redness or any drainage coming from the areas.  She states that while at Thanksgiving she did pass out. She did see her primary care physician for this.  She will likely have a stress test after the first of the year.  Because of this as well as her husband being sick she wishes to hold off on any surgical intervention which I agree with.  Denies any systemic complaints such as fevers, chills, nausea, vomiting. No acute changes since last appointment, and no other complaints at this time.   Objective: AAO x3, NAD DP/PT pulses palpable bilaterally, CRT less than 3 seconds Prominent metatarsal heads plantarly with atrophy of the fat pad. 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 Hyperkeratotic lesions to the right foot submetatarsal area as well as the arch is well as the left foot submetatarsal 1. There is no underlying ulceration, drainage or any signs of infection.  He also may be hypertrophic, dystrophic with yellow-brown discoloration and tenderness the nails 2 through 5 bilateral.  No redness or drainage or any swelling. No open lesions or pre-ulcerative lesions.  No pain with calf compression, swelling, warmth, erythema  Assessment: 76 year old female symptomatic hyperkeratotic lesions; symptomatic onychomycosis  Plan: -All treatment options discussed with the patient including all alternatives, risks, complications.  -Hyperkeratotic lesions are sharply debrided 3 without any complications or bleeding -Nail sharply debrided x8 without any complications or bleeding -We will hold any surgical intervention at this point. -Follow-up in 4 weeks or sooner if needed.  Celesta Gentile, DPM

## 2017-06-15 ENCOUNTER — Ambulatory Visit: Payer: Medicare Other | Admitting: Podiatry

## 2017-07-01 ENCOUNTER — Other Ambulatory Visit: Payer: Self-pay | Admitting: Family Medicine

## 2017-07-05 ENCOUNTER — Telehealth: Payer: Self-pay

## 2017-07-05 DIAGNOSIS — R21 Rash and other nonspecific skin eruption: Secondary | ICD-10-CM

## 2017-07-05 DIAGNOSIS — Z638 Other specified problems related to primary support group: Secondary | ICD-10-CM

## 2017-07-05 DIAGNOSIS — F419 Anxiety disorder, unspecified: Secondary | ICD-10-CM

## 2017-07-05 MED ORDER — DIAZEPAM 2 MG PO TABS: ORAL_TABLET | ORAL | 1 refills | Status: DC

## 2017-07-05 NOTE — Telephone Encounter (Signed)
Copied from [place] [id number]. Topic: Inquiry >> Jul 05, 2017 10:47 AM Pricilla Handler wrote: Reason for CRM: Patient called wanting to speak with Dr. [name]. Patient states that her husband, who is also one of Dr. Lillie Fragmin patients, is in the hospital and is expected to pass away soon. Patient wants to know if Dr. [name] would prescribe her sleeping pills. Patient would like a call back from Dr. Edilia Bo ASAP today. Patient's phone number [phone].       Thank You!!!

## 2017-07-05 NOTE — Telephone Encounter (Signed)
Called her and LMOM on her cell phone  [name] must be admitted out of the [facility] system as I do not see a current admission note. I am sorry to hear that he is so sick!  We have used valium for Fara in the past [place]: last filled on 12/3- ok to refill today

## 2017-07-13 ENCOUNTER — Ambulatory Visit (INDEPENDENT_AMBULATORY_CARE_PROVIDER_SITE_OTHER): Payer: Medicare Other | Admitting: Podiatry

## 2017-07-13 DIAGNOSIS — Q828 Other specified congenital malformations of skin: Secondary | ICD-10-CM

## 2017-07-13 DIAGNOSIS — G629 Polyneuropathy, unspecified: Secondary | ICD-10-CM

## 2017-07-18 NOTE — Progress Notes (Signed)
Subjective: Ms. Delaurentis presents the office today for concerns of painful calluses to both of her feet.  She states that they are very painful mostly with pressure in shoes.  She denies any recent injury or trauma to her feet she denies any swelling or redness or any drainage coming from the areas.  Otherwise she is doing well she presents today to have the area is trimmed. Denies any systemic complaints such as fevers, chills, nausea, vomiting. No acute changes since last appointment, and no other complaints at this time.   Her husband is under the care of hospice currently.  He is not expected to live much longer.  Objective: AAO x3, NAD DP/PT pulses palpable bilaterally, CRT less than 3 seconds Prominent metatarsal heads plantarly with atrophy of the fat pad. 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 Hyperkeratotic lesions to the right foot submetatarsal area as well as the arch is well as the left foot submetatarsal 1. There is no underlying ulceration, drainage or any signs of infection.  Nails without any pain today and she recently had a pedicure. No open lesions or pre-ulcerative lesions.  No pain with calf compression, swelling, warmth, erythema  Assessment: 77 year old female symptomatic hyperkeratotic lesions  Plan: -All treatment options discussed with the patient including all alternatives, risks, complications.  -Hyperkeratotic lesions are sharply debrided 3 without any complications or bleeding -She do not need or want nail trimmed today. -Follow-up in 4 weeks (at her request) or sooner if needed.  Celesta Gentile, DPM

## 2017-07-22 ENCOUNTER — Other Ambulatory Visit: Payer: Self-pay | Admitting: Podiatry

## 2017-07-22 ENCOUNTER — Other Ambulatory Visit: Payer: Self-pay

## 2017-07-22 ENCOUNTER — Telehealth: Payer: Self-pay | Admitting: Podiatry

## 2017-07-22 MED ORDER — PREGABALIN 75 MG PO CAPS: 75.0000 mg | ORAL_CAPSULE | Freq: Two times a day (BID) | ORAL | 0 refills | Status: DC

## 2017-07-22 NOTE — Telephone Encounter (Signed)
Please refill.

## 2017-07-22 NOTE — Progress Notes (Signed)
error 

## 2017-07-22 NOTE — Telephone Encounter (Signed)
I'm calling to get a refill of the Lyrica 75 mg capsule taken twice daily. I use the [facility] on [address] in [place]. The phone number here is [phone]. Thank you.

## 2017-07-22 NOTE — Progress Notes (Signed)
Refilled Lyrica

## 2017-07-22 NOTE — Telephone Encounter (Signed)
done

## 2017-07-26 ENCOUNTER — Telehealth: Payer: Self-pay | Admitting: *Deleted

## 2017-07-26 MED ORDER — PREGABALIN 75 MG PO CAPS: 75.0000 mg | ORAL_CAPSULE | Freq: Two times a day (BID) | ORAL | 5 refills | Status: DC

## 2017-07-26 NOTE — Telephone Encounter (Signed)
I spoke with pt and she states she has been on the Lyrica a very long time and it helps very much. Dr. [name] states refill Lyrica 75mg  #60 one bid +5refills, and pt should come in at her regular schedule for debridement. I informed pt and she stated understanding.

## 2017-07-26 NOTE — Telephone Encounter (Signed)
Pt requested refill of the Lyrica.

## 2017-07-26 NOTE — Telephone Encounter (Signed)
Faxed Lyrica rx to [facility].

## 2017-08-09 ENCOUNTER — Ambulatory Visit (INDEPENDENT_AMBULATORY_CARE_PROVIDER_SITE_OTHER): Payer: Medicare Other | Admitting: Obstetrics & Gynecology

## 2017-08-09 VITALS — BP 127/76 | HR 68 | Wt 174.0 lb

## 2017-08-09 DIAGNOSIS — N8189 Other female genital prolapse: Secondary | ICD-10-CM

## 2017-08-09 NOTE — Progress Notes (Signed)
  HPI Pt presents today to have pessary removed and cleaned.  She denies problems and reports that her pessary is working very well.  She denies further complaints.  Her husband died 2 weeks prev and she is experiencing grief.  She has a good support system but, she misses her friends from [place]. This coming weekend she will be at her daughters home in [place].    Review of Systems      Objective:   BP 127/76   Pulse 68   Wt 174 lb (78.9 kg)   BMI 31.83 kg/m     Physical Exam:  Lovely lady in NAD GYN: Pessary removed and cleaned; vaginal mucosa looks healty and with no excoriations or breakdown     Assessment:     Pessary check  Normal grief reaction due to the death of her husband   Plan:     F/u in 3 months to have pessary removed and cleaned F/u sooner prn  Pt was given info on water aerobics at the [facility]. There is a good network of older ladies for her to meet.  She was referred to the director of volunteers at [facility]   Total face-to-face time with patient was 20 min.  Greater than 50% was spent in counseling and coordination of care with the patient.   Dacen Frayre L. Harraway-Smith, M.D., Cherlynn June

## 2017-08-12 ENCOUNTER — Encounter: Payer: Self-pay | Admitting: Podiatry

## 2017-08-12 ENCOUNTER — Ambulatory Visit (INDEPENDENT_AMBULATORY_CARE_PROVIDER_SITE_OTHER): Payer: Medicare Other | Admitting: Podiatry

## 2017-08-12 ENCOUNTER — Encounter: Payer: Self-pay | Admitting: Obstetrics & Gynecology

## 2017-08-12 DIAGNOSIS — Q828 Other specified congenital malformations of skin: Secondary | ICD-10-CM

## 2017-08-12 DIAGNOSIS — G629 Polyneuropathy, unspecified: Secondary | ICD-10-CM

## 2017-08-12 NOTE — Progress Notes (Signed)
Subjective: Ms. Stennett presents the office today for concerns of painful calluses to both of her feet. She states they are ready to be trimmed. Denies any systemic complaints such as fevers, chills, nausea, vomiting. No acute changes since last appointment, and no other complaints at this time.   Her husband recently passed away.   Objective: AAO x3, NAD DP/PT pulses palpable bilaterally, CRT less than 3 seconds Prominent metatarsal heads plantarly with atrophy of the fat pad. 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  Hyperkeratotic lesions to the right foot submetatarsal area as well as the arch is well as the left foot submetatarsal 1. There is no underlying ulceration, drainage or any signs of infection.  Nails without any pain today and she recently had a pedicure. No open lesions or pre-ulcerative lesions.  No pain with calf compression, swelling, warmth, erythema  Assessment: 77 year old female symptomatic hyperkeratotic lesions  Plan: -All treatment options discussed with the patient including all alternatives, risks, complications.  -Hyperkeratotic lesions are sharply debrided 3 without any complications or bleeding -She do not need or want nail trimmed today. -Follow-up in 4 weeks (at her request) or sooner if needed.  Celesta Gentile, DPM

## 2017-09-03 ENCOUNTER — Ambulatory Visit: Payer: Self-pay

## 2017-09-03 NOTE — Telephone Encounter (Signed)
rec'd call from pt. To request an appt. With PCP for depression.  Stated she is just not able to sleep, or cope very well, since her husband died in 08/08/22.  Reported "feeling broken, empty, and lost."  "I cry all the time."  Stated the "Lorazepam" is not helping her sleep, and she is "just exhausted."  (unable to find Lorazepam on her medication list)  Reported she is not taking the Zoloft, due to experience of passing out x 1 episode, when she mixed it with alcohol.  Also, reported she is very stressed because her granddaughter is cutting, since her father was diagnosed with Stage 4 Colon CA.  Reported she is trying to get into Therapy through Hospice.  Denied any intention of hurting herself or others.  Attempted to schedule an appt. with Dr. [name] on 09/06/17; pt. unable to schedule appt. 4/8 or 4/9.  appt scheduled for 4/11 @ 11:15 AM.  Pt. was given phone # for [facility]; agreed to call them.          Reason for Disposition . [1] Depression AND [2] worsening (e.g.,sleeping poorly, less able to do activities of daily living)  Answer Assessment - Initial Assessment Questions 1. CONCERN: "What happened that made you call today?"     I'm exhausted and not sleeping  2. DEPRESSION SYMPTOM SCREENING: "How are you feeling overall?" (e.g., decreased energy, increased sleeping or difficulty sleeping, difficulty concentrating, feelings of sadness, guilt, hopelessness, or worthlessness)     I feel lost without him; I feel broken; I feel empty 3. RISK OF HARM - SUICIDAL IDEATION:  "Do you ever have thoughts of hurting or killing yourself?"  (e.g., yes, no, no but preoccupation with thoughts about death)   - INTENT:  "Do you have thoughts of hurting or killing yourself right NOW?" (e.g., yes, no, N/A)   - PLAN: "Do you have a specific plan for how you would do this?" (e.g., gun, knife, overdose, no plan, N/A)     No plan made  4. RISK OF HARM - HOMICIDAL IDEATION:  "Do you ever have thoughts  of hurting or killing someone else?"  (e.g., yes, no, no but preoccupation with thoughts about death)   - INTENT:  "Do you have thoughts of hurting or killing someone right NOW?" (e.g., yes, no, N/A)   - PLAN: "Do you have a specific plan for how you would do this?" (e.g., gun, knife, no plan, N/A)      no 5. FUNCTIONAL IMPAIRMENT: "How have things been going for you overall in your life? Have you had any more difficulties than usual doing your normal daily activities?"  (e.g., better, same, worse; self-care, school, work, interactions)    Feeling lost without close friends from [place]; only moved here 3 yrs. Ago. 6. SUPPORT: "Who is with you now?" "Who do you live with?" "Do you have family or friends nearby who you can talk to?"      I have 2 daughters that live here.  7. THERAPIST: "Do you have a counselor or therapist? Name?"     Trying to get into individual Therapy  8. STRESSORS: "Has there been any new stress or recent changes in your life?"     Yes; granddaughter is cutting ; her father was diagnosed with stage 4 Colon CA  9. DRUG ABUSE/ALCOHOL: "Do you drink alcohol or use any illegal drugs?"     No  10. OTHER: "Do you have any other health or medical symptoms right  now?" (e.g., fever)       no 11. PREGNANCY: "Is there any chance you are pregnant?" "When was your last menstrual period?"       no  Protocols used: DEPRESSION-A-AH

## 2017-09-03 NOTE — Telephone Encounter (Signed)
Patient is in a 30 min slot to see you next week.

## 2017-09-04 NOTE — Progress Notes (Signed)
[facility] [address], [place], [place] [address] [phone]  Date:  09/09/2017   Name:  Margaret [name]   DOB:  [birth year]   MRN:  [id number]  PCP:  Darreld Mclean, MD    Chief Complaint: grief (Pt's lost her husband 07/2017. Pt here to talk to about feeling depressed. )   History of Present Illness:  Margaret [name] is a 77 y.o. very pleasant female patient who presents with the following:  Here today to visit following the death of her husband [name]- he died of CHF on hospice care at home on 07/17/17  Recent phone note: rec'd call from pt. To request an appt. With PCP for depression.  Stated she is just not able to sleep, or cope very well, since her husband died in 2022/08/08.  Reported "feeling broken, empty, and lost."  "I cry all the time."  Stated the "Lorazepam" is not helping her sleep, and she is "just exhausted."  (unable to find Lorazepam on her medication list)  Reported she is not taking the Zoloft, due to experience of passing out x 1 episode, when she mixed it with alcohol.  Also, reported she is very stressed because her granddaughter is cutting, since her father was diagnosed with Stage 4 Colon CA.  Reported she is trying to get into Therapy through Hospice.  Denied any intention of hurting herself or others.  Attempted to schedule an appt. with Dr. [name] on 09/06/17; pt. unable to schedule appt. 4/8 or 4/9.  appt scheduled for 4/11 @ 11:15 AM.  Pt. was given phone # for [facility]; agreed to call them.           She is struggling to deal with the death of her husband She notes poor appetite; she will feel hungry, but when food is in front of her she does not want to eat it.  She feels "like I'm broken, part of me is just gone, after 58 years together."  She is having a hard time concentrating, and does not like to be alone When she is alone she tends to cry all the time  Her 77 yo  grand-daughter has been cutting herself quite a bit recently- this is really worrying Leda Gauze.  Her father- Fraida's SIL- has cancer and it is pretty serious.   This and other stressors seems to be driving her cutting  Currently Kyarra is living alone. Her one daughter [name] is selling her home and hopefully will move in with her for a while.  Being home is hard as there are so many memories- [place] is thinking of downsizing to a town house  She is not sleeping well- lorazepam does not seem to help really She tried zoloft but had an episode of "passing out" and did not take it again  She is still taking lyrica 75 BID  She has 2 daughters and other family members, and some good neighbor's who help to support her They moved to this area 3 years ago and is not sure if she should move back to [place]. However as most of her family is now in [place] she thinks she will just stay here.   She does not have any ideas of self harm   3/15- 30 lorazepam 2 mg per [place]; she is taking 1 at bedtime but it is not really helping her much   Discussed tapering off lorazepam and onto trazodone.  Will have her take 1/2 lorazepam at dinner and then 1/2 trazodone at bedtime for a couple of days- then DC lorazepam and go on 50 of trazodone if needed   She has lost a few lbs  Wt Readings from Last 3 Encounters:  09/09/17 168 lb 3.2 oz (76.3 kg)  08/09/17 174 lb (78.9 kg)  05/03/17 172 lb 6.4 oz (78.2 kg)    Patient Active Problem List   Diagnosis Date Noted  . Plantar fasciitis of right foot 02/03/2017  . Porokeratosis 02/03/2017  . Acute pancreatitis due to calculus of common bile duct 09/17/2016  . Acute gallstone pancreatitis   . Calculus of gallbladder with acute cholecystitis and obstruction   . LFT elevation   . Stress fracture 01/01/2016  . Insomnia 12/16/2015  . Peripheral neuropathy 10/18/2015  . Renal insufficiency 03/06/2015  . HTN (hypertension) 03/06/2015  . Osteopenia 03/06/2015  . GERD  (gastroesophageal reflux disease) 03/06/2015  . Vaginal pessary present 03/06/2015    Past Medical History:  Diagnosis Date  . Arthritis   . History of healed stress fracture 2013   bilateral feet  . Hx of phlebitis    reports remote hx of "superfical blood clots" never anticoagulated  . Hypertension   . Osteopenia     Past Surgical History:  Procedure Laterality Date  . CHOLECYSTECTOMY N/A 09/19/2016   Procedure: LAPAROSCOPIC CHOLECYSTECTOMY POSSIBLE INTRAOPERATIVE CHOLANGIOGRAM;  Surgeon: Rolm Bookbinder, MD;  Location: [facility];  Service: General;  Laterality: N/A;  . ERCP N/A 09/18/2016   Procedure: ENDOSCOPIC RETROGRADE CHOLANGIOPANCREATOGRAPHY (ERCP);  Surgeon: Doran Stabler, MD;  Location: [facility] ENDOSCOPY;  Service: Endoscopy;  Laterality: N/A;  . FOOT SURGERY     patient reports four foot surgeries  . FOOT SURGERY Left    bunion, hammer toe surgery  . FOOT SURGERY Right    shaved bunion, hammer toe correction  . KNEE ARTHROSCOPY Right 1997  . REPLACEMENT TOTAL KNEE Left   . 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 RETINAL DETACHMENT SURGERY    . RETINAL DETACHMENT SURGERY Left 2008  . THYROIDECTOMY, PARTIAL Right   . THYROIDECTOMY, PARTIAL  2005    Social History   Tobacco Use  . Smoking status: Former Smoker    Last attempt to quit: 06/02/1979    Years since quitting: 38.2  . Smokeless tobacco: Never Used  Substance Use Topics  . Alcohol use: Yes    Alcohol/week: 0.6 oz    Types: 1 Glasses of wine per week    Comment: Occ  . Drug use: No    Family History  Problem Relation Age of Onset  . Heart disease Mother   . Heart attack Mother   . Heart disease Father   . Diabetes Maternal Grandmother   . Cancer Paternal Grandfather        stomach    Allergies  Allergen Reactions  . Percocet [Oxycodone-Acetaminophen] Other (See Comments)    Pt. reports that the medication causes her to be light headed and pass out.    Medication list has been reviewed and  updated.  Current Outpatient Medications on File Prior to Visit  Medication Sig Dispense Refill  . Calcium Carb-Cholecalciferol (CALCIUM 600 + D) 600-200 MG-UNIT TABS Take 1 tablet by mouth 2 (two) times daily. 30 each 2  . LORazepam (ATIVAN) 2 MG tablet TK 1/2-1 T PO BID PRN FOR ANXIETY  0  . metoprolol succinate (TOPROL-XL) 25 MG 24 hr tablet Take 1 tablet (25 mg total) by  mouth daily. 90 tablet 3  . NONFORMULARY OR COMPOUNDED ITEM Apply 1-2 g topically 3 (three) times daily. 120 each 2  . omeprazole (PRILOSEC) 20 MG capsule TAKE 1 CAPSULE(20 MG) BY MOUTH DAILY 30 capsule 0  . pregabalin (LYRICA) 75 MG capsule Take 1 capsule (75 mg total) by mouth 2 (two) times daily. 60 capsule 5  . triamterene-hydrochlorothiazide (MAXZIDE-25) 37.5-25 MG tablet Take 1 tablet by mouth daily. 90 tablet 3   No current facility-administered medications on file prior to visit.     Review of Systems:  As per HPI- otherwise negative. No fever or chills No CP or SOB Poor appetite but no belly pain    Physical Examination: Vitals:   09/09/17 1138  BP: 122/82  Pulse: 72  Temp: 98.3 F (36.8 C)  SpO2: 98%   Vitals:   09/09/17 1138  Weight: 168 lb 3.2 oz (76.3 kg)   Body mass index is 30.76 kg/m. Ideal Body Weight:    GEN: WDWN, NAD, Non-toxic, A & O x 3, looks herself.  Not tearful today in room HEENT: Atraumatic, Normocephalic. Neck supple. No masses, No LAD. Ears and Nose: No external deformity. CV: RRR, No M/G/R. No JVD. No thrill. No extra heart sounds. PULM: CTA B, no wheezes, crackles, rhonchi. No retractions. No resp. distress. No accessory muscle use. EXTR: No c/c/e NEURO Normal gait.  PSYCH: Normally interactive. Conversant. Not depressed or anxious appearing.  Calm demeanor.    Assessment and Plan: Grief reaction - Plan: traZODone (DESYREL) 50 MG tablet  Adjustment insomnia - Plan: traZODone (DESYREL) 50 MG tablet  Stress due to family tension  Here today following the loss  of her husband of many years a little less than  2 months ago. Maeven is having a very appropriate grief response, but she is bothered by lack of sleep.  Discussed with her and offered by support and sympathy for her loss.  We decided to stop lorazepam and change to trazodone for sleep- see details in HPI Encouraged her to think about a grief support group, and to try and stay active.  We also talked about her getting a pet which is something she is considering  Plan to visit on one month See patient instructions for more details.     Encouraged   Signed Lamar Blinks, MD

## 2017-09-09 ENCOUNTER — Encounter: Payer: Self-pay | Admitting: Family Medicine

## 2017-09-09 ENCOUNTER — Ambulatory Visit (INDEPENDENT_AMBULATORY_CARE_PROVIDER_SITE_OTHER): Payer: Medicare Other | Admitting: Family Medicine

## 2017-09-09 ENCOUNTER — Ambulatory Visit: Payer: Medicare Other | Admitting: Podiatry

## 2017-09-09 VITALS — BP 122/82 | HR 72 | Temp 98.3°F | Wt 168.2 lb

## 2017-09-09 DIAGNOSIS — F5102 Adjustment insomnia: Secondary | ICD-10-CM | POA: Diagnosis not present

## 2017-09-09 DIAGNOSIS — F4321 Adjustment disorder with depressed mood: Secondary | ICD-10-CM | POA: Diagnosis not present

## 2017-09-09 DIAGNOSIS — Z638 Other specified problems related to primary support group: Secondary | ICD-10-CM | POA: Diagnosis not present

## 2017-09-09 MED ORDER — TRAZODONE HCL 50 MG PO TABS: 25.0000 mg | ORAL_TABLET | Freq: Every evening | ORAL | 3 refills | Status: DC | PRN

## 2017-09-09 NOTE — Patient Instructions (Addendum)
It was good to see you today, but I am so sorry for the loss of your [place].  I do think that what you are experiencing is normal after the loss of a spouse of so many years. Try to eat at least a bit to keep your strength up. I gave you an rx for trazodone to take at bedtime- try a 1/2 pill, and increase to a whole pill if needed.  Avoid combining this medication with any other sedating medications   Please see me in about one month to check on how you are doing, sooner if you are not ok I would recommend that you try group grief counseling so you can share with people who have been through a similar loss

## 2017-09-13 ENCOUNTER — Other Ambulatory Visit: Payer: Self-pay | Admitting: Family Medicine

## 2017-09-14 ENCOUNTER — Ambulatory Visit (INDEPENDENT_AMBULATORY_CARE_PROVIDER_SITE_OTHER): Payer: Medicare Other | Admitting: Podiatry

## 2017-09-14 DIAGNOSIS — Q828 Other specified congenital malformations of skin: Secondary | ICD-10-CM | POA: Diagnosis not present

## 2017-09-14 DIAGNOSIS — G629 Polyneuropathy, unspecified: Secondary | ICD-10-CM | POA: Diagnosis not present

## 2017-09-14 MED ORDER — PREGABALIN 75 MG PO CAPS: 75.0000 mg | ORAL_CAPSULE | Freq: Two times a day (BID) | ORAL | 5 refills | Status: DC

## 2017-09-15 NOTE — Telephone Encounter (Signed)
Pt following up on this rx LORazepam (ATIVAN) 2 MG tablet  Pt states she is out and hopes to get soon because it helps her.  [facility] - [place], [place] AT [address] [phone] (Phone) [phone] (Fax)

## 2017-09-15 NOTE — Progress Notes (Signed)
Subjective: Margaret [name] presents the office today for concerns of painful calluses to both of her feet. She states they are ready to be trimmed. Denies any systemic complaints such as fevers, chills, nausea, vomiting. No acute changes since last appointment, and no other complaints at this time.   She does report a fall over the weekend after tripping over a small curb.  She has some pain to the right side she did not seek medical treatment.  Objective: AAO x3, NAD DP/PT pulses palpable bilaterally, CRT less than 3 seconds Prominent metatarsal heads plantarly with atrophy of the fat pad. 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  Hyperkeratotic lesions to the right foot submetatarsal area as well as the arch is well as the left foot submetatarsal 1. There is no underlying ulceration, drainage or any signs of infection.  Nails without any pain today and she recently had a pedicure. No open lesions or pre-ulcerative lesions.  No pain with calf compression, swelling, warmth, erythema  Assessment: 77 year old female symptomatic hyperkeratotic lesions  Plan: -All treatment options discussed with the patient including all alternatives, risks, complications.  -Hyperkeratotic lesions are sharply debrided 3 without any complications or bleeding -Encouraged her to see her primary care physician today in regards to the recent. -She do not need or want nail trimmed today. -Follow-up in 4 weeks (at her request) or sooner if needed.  Celesta Gentile, DPM

## 2017-09-15 NOTE — Telephone Encounter (Signed)
Last seen here just very recently [place]:  08/13/2017  1  07/05/2017  Lorazepam 2 Mg Tablet  30 15 Je Cop  [id number]  [facility] ([id number])  0 4.00 LME Comm Ins  [place]  07/22/2017  1  07/22/2017  Lyrica 75 Mg Capsule  60 30 Ma Wag  [id number]  [facility] ([id number])  0 1.01 LME Comm Ins  [place]  07/05/2017  1  07/05/2017  Lorazepam 2 Mg Tablet  30 15 Je Cop  [id number]  [facility] ([id number])  0 4.00 LME Comm Ins  [place]  06/20/2017  1  01/18/2017  Lyrica 75 Mg Capsule  60 30 Ma Wag  [id number]  [facility] ([id number])  5 1.01 LME Comm Ins  [place]  05/17/2017  1  01/18/2017  Lyrica 75 Mg Capsule  60 59 Ma Wag  [id number]  [facility] ([id number])  4 1.01 LME Comm Ins    05/03/2017  1  01/27/2017  Diazepam 2 Mg Tablet  25 12 Je Cop  [id number]  [facility] ([id number])  1      Did refill for her now

## 2017-10-05 DIAGNOSIS — H2513 Age-related nuclear cataract, bilateral: Secondary | ICD-10-CM | POA: Diagnosis not present

## 2017-10-11 DIAGNOSIS — Z411 Encounter for cosmetic surgery: Secondary | ICD-10-CM | POA: Diagnosis not present

## 2017-10-11 DIAGNOSIS — L219 Seborrheic dermatitis, unspecified: Secondary | ICD-10-CM | POA: Diagnosis not present

## 2017-10-14 ENCOUNTER — Ambulatory Visit (INDEPENDENT_AMBULATORY_CARE_PROVIDER_SITE_OTHER): Payer: Medicare Other | Admitting: Podiatry

## 2017-10-14 ENCOUNTER — Encounter: Payer: Self-pay | Admitting: Podiatry

## 2017-10-14 ENCOUNTER — Ambulatory Visit (INDEPENDENT_AMBULATORY_CARE_PROVIDER_SITE_OTHER): Payer: Medicare Other

## 2017-10-14 DIAGNOSIS — M7751 Other enthesopathy of right foot: Secondary | ICD-10-CM

## 2017-10-14 DIAGNOSIS — G629 Polyneuropathy, unspecified: Secondary | ICD-10-CM

## 2017-10-14 DIAGNOSIS — Q828 Other specified congenital malformations of skin: Secondary | ICD-10-CM

## 2017-10-14 DIAGNOSIS — M84374A Stress fracture, right foot, initial encounter for fracture: Secondary | ICD-10-CM | POA: Diagnosis not present

## 2017-10-14 DIAGNOSIS — M779 Enthesopathy, unspecified: Secondary | ICD-10-CM

## 2017-10-17 NOTE — Progress Notes (Signed)
Subjective: Ms. Sudol presents the office today for concerns of painful calluses to both of her feet. She states they are ready to be trimmed.  She also today has secondary concerns of her right foot is been swollen she feels that there is a broken bone however she does not recall any injury.  She states that her to put pressure on the area.  Has been wearing a surgical shoe which is been helping.  Denies any systemic complaints such as fevers, chills, nausea, vomiting. No acute changes since last appointment, and no other complaints at this time.   Objective: AAO x3, NAD DP/PT pulses palpable bilaterally, CRT less than 3 seconds Prominent metatarsal heads plantarly with atrophy of the fat pad. 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  Hyperkeratotic lesions to the right foot submetatarsal area as well as the arch is well as the left foot submetatarsal 1. There is no underlying ulceration, drainage or any signs of infection.  There is increased swelling to the dorsal aspect of right midfoot on the Lisfranc joint there is a increased area of tenderness on the dorsal midfoot but there is no 1 specific area pinpoint tenderness.  There is diffuse tenderness to the right foot.  There is no erythema associate with the swelling and there is no open lesions. No open lesions or pre-ulcerative lesions.  No pain with calf compression, swelling, warmth, erythema  Assessment: 77 year old female symptomatic hyperkeratotic lesions; stress fracture right foot  Plan: -All treatment options discussed with the patient including all alternatives, risks, complications. -X-rays were obtained and reviewed.  There is does appear to be increased arthritic changes present of the Lisfranc joint and there is evidence of old fracture of the fifth metatarsal however there is no definitive evidence of acute fracture or stress fracture identified today.  However given her increased pain and  swelling I do have treated for the stress fracture.  Recommend continue with surgical shoe however also discussed the cam boot as the surgical she has been causing issues.  A short cam boot was dispensed today.  Ice and elevate. -Hyperkeratotic lesions are sharply debrided 3 without any complications or bleeding -Encouraged her to see her primary care physician today in regards to the recent. -She do not need or want nail trimmed today. -Follow-up in 2 weeks or sooner if needed.  *x-ray next appointment right foot     Celesta Gentile, DPM

## 2017-10-26 ENCOUNTER — Other Ambulatory Visit: Payer: Self-pay | Admitting: Family Medicine

## 2017-10-28 ENCOUNTER — Encounter: Payer: Self-pay | Admitting: Podiatry

## 2017-10-28 ENCOUNTER — Ambulatory Visit (INDEPENDENT_AMBULATORY_CARE_PROVIDER_SITE_OTHER): Payer: Medicare Other

## 2017-10-28 ENCOUNTER — Ambulatory Visit (INDEPENDENT_AMBULATORY_CARE_PROVIDER_SITE_OTHER): Payer: Medicare Other | Admitting: Podiatry

## 2017-10-28 DIAGNOSIS — M79671 Pain in right foot: Secondary | ICD-10-CM | POA: Diagnosis not present

## 2017-10-28 DIAGNOSIS — M779 Enthesopathy, unspecified: Secondary | ICD-10-CM

## 2017-10-28 DIAGNOSIS — M84374A Stress fracture, right foot, initial encounter for fracture: Secondary | ICD-10-CM | POA: Diagnosis not present

## 2017-10-29 ENCOUNTER — Other Ambulatory Visit: Payer: Self-pay | Admitting: Family Medicine

## 2017-10-29 NOTE — Telephone Encounter (Signed)
Requesting:Ativan Contract:none, needs CSC HUD:JSHF, needs updated UDS Last Visit:09/09/17 Next Visit:none with pcp Last Refill:09/15/17 1-refill  Patient receiving additional controlled substance Lyrica from provider Mayo Ao. Please advise.   Please Advise

## 2017-10-31 NOTE — Progress Notes (Signed)
Subjective: 77 year old female presents the office for follow-up evaluation of right foot pain.  She states that she is doing much better she is not having any pain and swelling to her foot is much improved.  She has not been wearing her boot although she is wearing today but she does admit that she has not been wearing it otherwise.  No recent injury or falls.  No redness to her feet.  No open sores. Denies any systemic complaints such as fevers, chills, nausea, vomiting. No acute changes since last appointment, and no other complaints at this time.   Objective: AAO x3, NAD DP/PT pulses palpable bilaterally, CRT less than 3 seconds On dorsal aspect the right midfoot is what appears to be soft tissue mass, ganglion cyst but there is no tenderness palpation of this area.  There is no area pinpoint bony tenderness or pain to vibratory sensation.  There is no significant edema, erythema, increase in warmth.  No other areas of tenderness. No open lesions or pre-ulcerative lesions.  No pain with calf compression, swelling, warmth, erythema  Assessment: 77 year old female with resolved right foot pain  Plan: -All treatment options discussed with the patient including all alternatives, risks, complications.  -Repeat x-rays were obtained and reviewed.  There is no definitive evidence of acute fracture although there is evidence of prior fracture of the fifth metatarsal.  There is significant arthritic changes present as well as digital deformity, HAV. -At this time her pain is resolved and she can continue wearing a regular shoe for monitoring recurrence that she is at risk of stress fractures.  She does not want orthotics or braces. -Follow-up as scheduled or sooner if needed. -Patient encouraged to call the office with any questions, concerns, change in symptoms.  She has no further questions or concerns today.  Trula Slade DPM

## 2017-11-22 ENCOUNTER — Ambulatory Visit (INDEPENDENT_AMBULATORY_CARE_PROVIDER_SITE_OTHER): Payer: Medicare Other

## 2017-11-22 ENCOUNTER — Encounter: Payer: Self-pay | Admitting: Podiatry

## 2017-11-22 ENCOUNTER — Ambulatory Visit (INDEPENDENT_AMBULATORY_CARE_PROVIDER_SITE_OTHER): Payer: Medicare Other | Admitting: Podiatry

## 2017-11-22 DIAGNOSIS — M79604 Pain in right leg: Secondary | ICD-10-CM | POA: Diagnosis not present

## 2017-11-22 DIAGNOSIS — S99921A Unspecified injury of right foot, initial encounter: Secondary | ICD-10-CM

## 2017-11-22 DIAGNOSIS — G629 Polyneuropathy, unspecified: Secondary | ICD-10-CM | POA: Diagnosis not present

## 2017-11-22 DIAGNOSIS — Q828 Other specified congenital malformations of skin: Secondary | ICD-10-CM | POA: Diagnosis not present

## 2017-11-23 NOTE — Progress Notes (Signed)
[facility] [place], [place], [place] [address] [phone]  Date:  11/24/2017   Name:  Margaret [name]   DOB:  [birth year]   MRN:  [id number]  PCP:  Darreld Mclean, MD    Chief Complaint: Hernia (lump in abdomen, 1 month, no pain)   History of Present Illness:  Margaret [name] is a 77 y.o. very pleasant female patient who presents with the following:  Here today with concern of possible hernia in her abd wall  History of HTN, gerd, osteopenia Last visit with me in 10-23-2022 following the recent death of Margaret [name], her husband: Here today to visit following the death of her husband Margaret [name] Recent phone note: rec'd call from pt. To request an appt. With PCP for depression. Stated she is just not able to sleep, or cope very well, since her husband died in 08/24/2022. Reported "feeling broken, empty, and lost." "I cry all the time." Stated the "Lorazepam" is not helping her sleep, and she is "just exhausted." (unable to find Lorazepam on her medication list) Reported she is not taking the Zoloft, due to experience of passing out x 1 episode, when she mixed it with alcohol. Also, reported she is very stressed because her granddaughter is cutting, since her father was diagnosed with Stage 4 Colon CA. Reported she is trying to get into Therapy through Hospice. Denied any intention of hurting herself or others.   She is struggling to deal with the death of her husband She notes poor appetite; she will feel hungry, but when food is in front of her she does not want to eat it.  She feels "like I'm broken, part of me is just gone, after 58 years together."  She is having a hard time concentrating, and does not like to be alone When she is alone she tends to cry all the time Her 55 yo grand-daughter has been cutting herself quite a bit recently- this is really  worrying Margaret [name].  Her father- Margaret [name]- has cancer and it is pretty serious.   This and other stressors seems to be driving her cutting Currently Margaret [name] is living alone. Her one daughter Margaret [name] is selling her home and hopefully will move in with her for a while.  Being home is hard as there are so many memories- [place] is thinking of downsizing to a town house She is not sleeping well- lorazepam does not seem to help really She tried zoloft but had an episode of "passing out" and did not take it again  She is still taking lyrica 75 BID She has 2 daughters and other family members, and some good neighbor's who help to support her They moved to this area 3 years ago and is not sure if she should move back to [place]. However as most of her family is now in [place] she thinks she will just stay here.   She does not have any ideas of self harm  3/15- 30 lorazepam 2 mg per [place]; she is taking 1 at bedtime but it is not really helping her much  Discussed tapering off lorazepam and onto trazodone. Will have her take 1/2 lorazepam at dinner and then 1/2 trazodone at bedtime for a couple of days- then DC lorazepam and go on 50 of trazodone if needed   She had acute gallstone pancreatitis in 2018 and had  a lap chole She is seeing Dr. [name] for a stress fracture in her foot -she is in a velcro boot and is using a walker She reports that she stayed on lorazepam. Did not end up stopping her ativan and changing to trazodone as we had discussed  Report that she is taking 1.5 pills at bedtime = 3 mg.  Advised her to stop taking more than 2 mg at a time given her age - she states understanding Reports that she is not taking any lorazepam except at bedtime [facility];  11/13/2017  1  09/14/2017  Lyrica 75 Mg Capsule  60 30 Ma Wag  [id number]  Wal ([id number])  2 1.01 LME Comm Ins  [place]  10/29/2017  1  10/29/2017  Lorazepam 2 Mg Tablet  30 15 Je Cop  [id number]  Wal ([id number])  0 4.00 LME Comm Ins  [place]  10/16/2017  1  09/14/2017   Lyrica 75 Mg Capsule  60 30 Ma Wag  [id number]  Wal ([id number])  1 1.01 LME Comm Ins  [place]  10/04/2017  1  09/15/2017  Lorazepam 2 Mg Tablet  30 15 Je Cop  [id number]  Wal ([id number])  1 4.00 LME Comm Ins  [place]  09/15/2017  1  09/15/2017  Lorazepam 2 Mg Tablet  30 15 Je Cop  [id number]  Wal ([id number])  0 4.00 LME Comm Ins  [place]  09/14/2017  1  09/14/2017  Lyrica 75 Mg Capsule  60 [place]  [id number]  Wal ([id number])         Keziah is doing a bit better as far as adjusting to being without [place].   Her daughter Margaret [name] is staying with her and helping her out. She has sold her home and is living with Margaret [name] for now- they are planning a trip to visit [place] later this summer which they are both looking forward to She did balance classes at the [facility] last year and this did help her- would like to do this again later this year  She has noted that her lower stomach does not seem to be quite symmetrical- wonders if she might have a hernia.  She has noted this for a month or so It is not painful at all No concerns with eating No heartburn or reflux  No vomiting or diarrhea She just was not sure if this was a hernia and if so what should she do   She also is concerned about hair loss- has noted that her hair is more thin in general over the last couple of months.  She is having to fluff up her hair to cover this.  Luckily her daughter Margaret [name] is a hairdresser!   Patient Active Problem List   Diagnosis Date Noted  . Plantar fasciitis of right foot 02/03/2017  . Porokeratosis 02/03/2017  . Acute pancreatitis due to calculus of common bile duct 09/17/2016  . Acute gallstone pancreatitis   . Calculus of gallbladder with acute cholecystitis and obstruction   . LFT elevation   . Stress fracture 01/01/2016  . Insomnia 12/16/2015  . Peripheral neuropathy 10/18/2015  . Renal insufficiency 03/06/2015  . HTN (hypertension) 03/06/2015  . Osteopenia 03/06/2015  . GERD (gastroesophageal reflux disease) 03/06/2015  . Vaginal pessary  present 03/06/2015    Past Medical History:  Diagnosis Date  . Arthritis   . History of healed stress fracture 2013   bilateral feet  . Hx of phlebitis    reports remote hx of "superfical blood clots"  never anticoagulated  . Hypertension   . Osteopenia     Past Surgical History:  Procedure Laterality Date  . CHOLECYSTECTOMY N/A 09/19/2016   Procedure: LAPAROSCOPIC CHOLECYSTECTOMY POSSIBLE INTRAOPERATIVE CHOLANGIOGRAM;  Surgeon: Rolm Bookbinder, MD;  Location: [facility];  Service: General;  Laterality: N/A;  . ERCP N/A 09/18/2016   Procedure: ENDOSCOPIC RETROGRADE CHOLANGIOPANCREATOGRAPHY (ERCP);  Surgeon: Doran Stabler, MD;  Location: [facility] ENDOSCOPY;  Service: Endoscopy;  Laterality: N/A;  . FOOT SURGERY     patient reports four foot surgeries  . FOOT SURGERY Left    bunion, hammer toe surgery  . FOOT SURGERY Right    shaved bunion, hammer toe correction  . KNEE ARTHROSCOPY Right 1997  . REPLACEMENT TOTAL KNEE Left   . 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 RETINAL DETACHMENT SURGERY    . RETINAL DETACHMENT SURGERY Left 2008  . THYROIDECTOMY, PARTIAL Right   . THYROIDECTOMY, PARTIAL  2005    Social History   Tobacco Use  . Smoking status: Former Smoker    Last attempt to quit: 06/02/1979    Years since quitting: 38.5  . Smokeless tobacco: Never Used  Substance Use Topics  . Alcohol use: Yes    Alcohol/week: 0.6 oz    Types: 1 Glasses of wine per week    Comment: Occ  . Drug use: No    Family History  Problem Relation Age of Onset  . Heart disease Mother   . Heart attack Mother   . Heart disease Father   . Diabetes Maternal Grandmother   . Cancer Paternal Grandfather        stomach    Allergies  Allergen Reactions  . Percocet [Oxycodone-Acetaminophen] Other (See Comments)    Pt. reports that the medication causes her to be light headed and pass out.    Medication list has been reviewed and updated.  Current Outpatient Medications on File Prior to Visit   Medication Sig Dispense Refill  . Calcium Carb-Cholecalciferol (CALCIUM 600 + D) 600-200 MG-UNIT TABS Take 1 tablet by mouth 2 (two) times daily. 30 each 2  . LORazepam (ATIVAN) 2 MG tablet TAKE 1/2 TO 1 TABLET BY MOUTH TWICE DAILY AS NEEDED FOR ANXIETY 30 tablet 0  . metoprolol succinate (TOPROL-XL) 25 MG 24 hr tablet Take 1 tablet (25 mg total) by mouth daily. 90 tablet 3  . NONFORMULARY OR COMPOUNDED ITEM Apply 1-2 g topically 3 (three) times daily. 120 each 2  . omeprazole (PRILOSEC) 20 MG capsule TAKE 1 CAPSULE(20 MG) BY MOUTH DAILY 30 capsule 5  . pregabalin (LYRICA) 75 MG capsule Take 1 capsule (75 mg total) by mouth 2 (two) times daily. 60 capsule 5  . triamterene-hydrochlorothiazide (MAXZIDE-25) 37.5-25 MG tablet Take 1 tablet by mouth daily. 90 tablet 3   No current facility-administered medications on file prior to visit.     Review of Systems:  As per HPI- otherwise negative. No fever or chills No belly pain   Physical Examination: Vitals:   11/24/17 1057  BP: 114/68  Pulse: 68  Resp: 16  Temp: 97.6 F (36.4 C)  SpO2: 99%   Vitals:   11/24/17 1057  Weight: 175 lb (79.4 kg)  Height: 5\' 2"  (1.575 m)   Body mass index is 32.01 kg/m. Ideal Body Weight: Weight in (lb) to have BMI = 25: 136.4  GEN: WDWN, NAD, Non-toxic, A & O x 3, looks well, overweight HEENT: Atraumatic, Normocephalic. Neck supple. No masses, No LAD.  Bilateral TM wnl, oropharynx  normal.  PEERL,EOMI.  Her hair is thin over the entire scalp but no bald spots noted  Ears and Nose: No external deformity. CV: RRR, No M/G/R. No JVD. No thrill. No extra heart sounds. PULM: CTA B, no wheezes, crackles, rhonchi. No retractions. No resp. distress. No accessory muscle use. ABD: S, NT, ND, +BS. No rebound. No HSM.  She does appear to have a reducible, soft abd wall hernia - ?spigelian, as there is a slight asymeetry to the abd wall on observation.  It is not tender at all to palpation and seems to reduce with  gentle pressure EXTR: No c/c/e NEURO Normal gait.  PSYCH: Normally interactive. Conversant. Not depressed or anxious appearing.  Calm demeanor.  velcro boot on right foot, she is using a walker today   Assessment and Plan: Hyperglycemia - Plan: Basic metabolic panel  Hair loss - Plan: TSH  Essential hypertension  Screening for hyperlipidemia - Plan: Lipid panel  Other specified abdominal hernia without obstruction or gangrene  Adjustment insomnia - Plan: LORazepam (ATIVAN) 2 MG tablet  Grief reaction  Following up today Will check her BMP for history of hyperglycemia, lipids today BP is under good control on current regiment of toprol xl, maxzide Thinning hair- check TSH but suspect due to telogen effluvium due to stress- discussed with pt Refilled her ativan and discussed safe use abd hernia- completely asymptomatic except pt noted this visually.  Cautioned about sign of complication such as pain, vomiting- if these occur seek care immediatly   BP Readings from Last 3 Encounters:  11/24/17 114/68  09/09/17 122/82  08/09/17 127/76    Signed Lamar Blinks, MD  Received her labs-  Results for orders placed or performed in visit on 11/24/17  TSH  Result Value Ref Range   TSH 1.98 0.35 - 4.50 uIU/mL  Basic metabolic panel  Result Value Ref Range   Sodium 141 135 - 145 mEq/L   Potassium 4.8 3.5 - 5.1 mEq/L   Chloride 101 96 - 112 mEq/L   CO2 32 19 - 32 mEq/L   Glucose, Bld 96 70 - 99 mg/dL   BUN 34 (H) 6 - 23 mg/dL   Creatinine, Ser 1.31 (H) 0.40 - 1.20 mg/dL   Calcium 10.1 8.4 - 10.5 mg/dL   GFR 41.81 (L) >60.00 mL/min  Lipid panel  Result Value Ref Range   Cholesterol 222 (H) 0 - 200 mg/dL   Triglycerides 161.0 (H) 0.0 - 149.0 mg/dL   HDL 72.50 >39.00 mg/dL   VLDL 32.2 0.0 - 40.0 mg/dL   LDL Cholesterol 117 (H) 0 - 99 mg/dL   Total CHOL/HDL Ratio 3    NonHDL 149.21    Message to pt  Your thyroid is normal- not the cause of your hair loss it appears.  I  do think you hair will likely start to come back, but please alert me if it does not Cholesterol is overall favorable Your kidney function is a bit lower than baseline for you.  Also, your blood pressure is a bit low. I would suggest that we cut your maxzide (fluid pil) in half as we may be over-treating you.  This change will likely also help your kidneys.   Let me know if you have any increase in swelling after making this decrease.  Otherwise, please come and see me in about 3 months to check on how you are doing- take care!

## 2017-11-24 ENCOUNTER — Ambulatory Visit (HOSPITAL_BASED_OUTPATIENT_CLINIC_OR_DEPARTMENT_OTHER)
Admission: RE | Admit: 2017-11-24 | Discharge: 2017-11-24 | Disposition: A | Discharge status: Home / Self Care | Payer: Medicare Other | Source: Ambulatory Visit | Attending: Podiatry | Admitting: Podiatry

## 2017-11-24 ENCOUNTER — Encounter: Payer: Self-pay | Admitting: Family Medicine

## 2017-11-24 ENCOUNTER — Ambulatory Visit (INDEPENDENT_AMBULATORY_CARE_PROVIDER_SITE_OTHER): Payer: Medicare Other | Admitting: Family Medicine

## 2017-11-24 VITALS — BP 114/68 | HR 68 | Temp 97.6°F | Resp 16 | Ht 62.0 in | Wt 175.0 lb

## 2017-11-24 DIAGNOSIS — F4321 Adjustment disorder with depressed mood: Secondary | ICD-10-CM

## 2017-11-24 DIAGNOSIS — R739 Hyperglycemia, unspecified: Secondary | ICD-10-CM | POA: Diagnosis not present

## 2017-11-24 DIAGNOSIS — X58XXXA Exposure to other specified factors, initial encounter: Secondary | ICD-10-CM | POA: Diagnosis not present

## 2017-11-24 DIAGNOSIS — K458 Other specified abdominal hernia without obstruction or gangrene: Secondary | ICD-10-CM | POA: Diagnosis not present

## 2017-11-24 DIAGNOSIS — S99921A Unspecified injury of right foot, initial encounter: Secondary | ICD-10-CM | POA: Diagnosis present

## 2017-11-24 DIAGNOSIS — F5102 Adjustment insomnia: Secondary | ICD-10-CM | POA: Diagnosis not present

## 2017-11-24 DIAGNOSIS — S82001A Unspecified fracture of right patella, initial encounter for closed fracture: Secondary | ICD-10-CM | POA: Diagnosis not present

## 2017-11-24 DIAGNOSIS — I10 Essential (primary) hypertension: Secondary | ICD-10-CM

## 2017-11-24 DIAGNOSIS — Z13220 Encounter for screening for lipoid disorders: Secondary | ICD-10-CM

## 2017-11-24 DIAGNOSIS — M179 Osteoarthritis of knee, unspecified: Secondary | ICD-10-CM | POA: Insufficient documentation

## 2017-11-24 DIAGNOSIS — M79604 Pain in right leg: Secondary | ICD-10-CM | POA: Diagnosis present

## 2017-11-24 DIAGNOSIS — L659 Nonscarring hair loss, unspecified: Secondary | ICD-10-CM | POA: Diagnosis not present

## 2017-11-24 LAB — BASIC METABOLIC PANEL
BUN: 34 mg/dL — ABNORMAL HIGH (ref 6–23)
CO2: 32 mEq/L (ref 19–32)
CREATININE: 1.31 mg/dL — AB (ref 0.40–1.20)
Calcium: 10.1 mg/dL (ref 8.4–10.5)
Chloride: 101 mEq/L (ref 96–112)
GFR: 41.81 mL/min — AB (ref >60.00)
Glucose, Bld: 96 mg/dL (ref 70–99)
Potassium: 4.8 mEq/L (ref 3.5–5.1)
Sodium: 141 mEq/L (ref 135–145)

## 2017-11-24 LAB — TSH: TSH: 1.98 u[IU]/mL (ref 0.35–4.50)

## 2017-11-24 LAB — LIPID PANEL
CHOLESTEROL: 222 mg/dL — AB (ref 0–200)
HDL: 72.5 mg/dL (ref >39.00)
LDL Cholesterol: 117 mg/dL — ABNORMAL HIGH (ref 0–99)
NonHDL: 149.21
Total CHOL/HDL Ratio: 3
Triglycerides: 161 mg/dL — ABNORMAL HIGH (ref 0.0–149.0)
VLDL: 32.2 mg/dL (ref 0.0–40.0)

## 2017-11-24 MED ORDER — LORAZEPAM 2 MG PO TABS: 2.0000 mg | ORAL_TABLET | Freq: Every evening | ORAL | 3 refills | Status: DC | PRN

## 2017-11-24 NOTE — Patient Instructions (Signed)
Good to see you today!  I agree that you do have a hernia in your abdominal wall.  As long as not painful there is nothing needed- however if you develop any severe or persistent pain please seek care right away!  We will get some labs for you today and cehck your thyroid. However, if this is normal I suspect that you have stress related hair loss.  Thankfully this usually resolves with time.   Please stop by the imaging dept on your way out to have the x-rays ordered by Dr. [name].   Please reduce your lorazepam use to no more than 2 mg per dose for safety

## 2017-11-25 ENCOUNTER — Telehealth: Payer: Self-pay | Admitting: *Deleted

## 2017-11-25 NOTE — Telephone Encounter (Signed)
Dr. [name] states x-ray shows right knee cap possible old fracture, refer to [facility] - Dr. Ninfa Linden, or who she prefers. Left message requesting call back to discuss results of x-rays.

## 2017-11-25 NOTE — Progress Notes (Signed)
Subjective: Margaret [name] presents the office today for concerns of painful calluses to both of her feet.  She states that she also had a fall this weekend and she has an appointment to see her primary care physician tomorrow.  She states that her leg does hurt but she is able to bear weight.  She states they are ready to be trimmed. Denies any systemic complaints such as fevers, chills, nausea, vomiting. No acute changes since last appointment, and no other complaints at this time.   Objective: AAO x3, NAD DP/PT pulses palpable bilaterally, CRT less than 3 seconds Prominent metatarsal heads plantarly with atrophy of the fat pad. 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  Hyperkeratotic lesions to the right foot submetatarsal area as well as the arch is well as the left foot submetatarsal 1. There is no underlying ulceration, drainage or any signs of infection.  There is tenderness along the fibula more to the mid aspect of the fibula.  No significant discomfort to palpation of the foot.  There is still ganglion cyst or soft tissue mass present dorsal aspect of the right midfoot without any discomfort.  There is no area pinpoint tenderness to the right foot there is some mild discomfort to the ankle. Nails without any pain today and she recently had a pedicure. No open lesions or pre-ulcerative lesions.  No pain with calf compression, swelling, warmth, erythema  Assessment: 77 year old female symptomatic hyperkeratotic lesions; fall  Plan: -All treatment options discussed with the patient including all alternatives, risks, complications.  -X-rays were obtained.  No definitive evidence of acute fracture identified today.  Her pain is more on the mid fibula.  Will order tib-fib x-ray for the hospital. -Hyperkeratotic lesions are sharply debrided 3 without any complications or bleeding -Encouraged her to see her primary care physician today in regards to the recent.   She has had some recent falls no here to follow-up with her primary care physician for this. -Follow-up in 2 months or sooner if needed.  Celesta Gentile, DPM

## 2017-11-26 ENCOUNTER — Telehealth: Payer: Self-pay | Admitting: Podiatry

## 2017-11-26 DIAGNOSIS — M79604 Pain in right leg: Secondary | ICD-10-CM

## 2017-11-26 NOTE — Telephone Encounter (Signed)
I saw Dr. [name] on Monday. I got the report on my x-rays and I was referred to an orthopaedic doctor. I was told to get my images from [facility] but I want to know why I'm going there when Dr. [name] is in [place]? If you could give me a call back at [phone] I would greatly appreciate it because I'm quite confused. Thank you so much and have a good day.

## 2017-11-26 NOTE — Telephone Encounter (Signed)
I called the pt back to let her know she needed to get her disc of x-rays from [facility] because they are the ones who took the x-rays of her knee and we cannot release that information since we did not do it in our office. I told the pt it is where she went for her x-ray on Wednesday. Pt stated thank you.

## 2017-11-26 NOTE — Telephone Encounter (Signed)
I informed pt Dr. Leigh Aurora review of the x-ray results were that she had a fracture knee cap of undetermined age and would refer to an orthopedic doctor. Pt states she is going with her dtr to an orthopedic doctor on Monday. I told her she could get the doctor's name from her dtr and we could refer, or she could get the information from the doctor's office on Monday and call me to refer either way.

## 2017-11-26 NOTE — Telephone Encounter (Signed)
I saw Dr. [name] on Monday and he sent me for x-rays I had done on Tuesday or Wednesday. Someone called me with the results yesterday but I was not home, so I'm calling back to get the results. My phone number is [phone]. Thank you and have a good day.

## 2017-11-26 NOTE — Telephone Encounter (Signed)
I called earlier this morning about results of x-rays from Dr. [name]. They called back and said I had a fractured patella and wanted me to see an orthopaedic doctor. I didn't have his name but I told them my daughter was going to see one on 01 July. They said if I could get his name and number they would see if I could get in at the same time. His name is Dr. Martinique Case and the phone number there is [phone]. I would appreciate a call back to see if that worked and her appointment is at 2:10 pm on Monday 01 July. My number is [phone]. Thank you so much and have a good day.

## 2017-11-26 NOTE — Addendum Note (Signed)
Addended by: Harriett Sine D on: 11/26/2017 12:01 PM   Modules accepted: Orders

## 2017-11-26 NOTE — Telephone Encounter (Signed)
I informed pt of appt with Dr. Martinique Case. Pt states she has another appt. I gave pt the [place] [phone] to reschedule and instructed to take a copy of the x-ray disc to the appt. Pt states understanding.

## 2017-11-26 NOTE — Telephone Encounter (Signed)
Lauren - [facility] states pt is to be scheduled by referring doctor and pt information faxed to [phone] and imaging disc brought to appt. Pt is scheduled by Lauren with Dr. Martinique Case on 11/29/2017 arrive 3:15pm for 3:30pm appt.

## 2017-11-30 DIAGNOSIS — H25013 Cortical age-related cataract, bilateral: Secondary | ICD-10-CM | POA: Diagnosis not present

## 2017-11-30 DIAGNOSIS — H25043 Posterior subcapsular polar age-related cataract, bilateral: Secondary | ICD-10-CM | POA: Diagnosis not present

## 2017-11-30 DIAGNOSIS — I10 Essential (primary) hypertension: Secondary | ICD-10-CM | POA: Diagnosis not present

## 2017-11-30 DIAGNOSIS — H2512 Age-related nuclear cataract, left eye: Secondary | ICD-10-CM | POA: Diagnosis not present

## 2017-11-30 DIAGNOSIS — H2513 Age-related nuclear cataract, bilateral: Secondary | ICD-10-CM | POA: Diagnosis not present

## 2017-12-05 ENCOUNTER — Other Ambulatory Visit: Payer: Self-pay | Admitting: Family Medicine

## 2017-12-05 DIAGNOSIS — I10 Essential (primary) hypertension: Secondary | ICD-10-CM

## 2018-01-17 DIAGNOSIS — H2512 Age-related nuclear cataract, left eye: Secondary | ICD-10-CM | POA: Diagnosis not present

## 2018-01-18 ENCOUNTER — Encounter: Payer: Self-pay | Admitting: Family Medicine

## 2018-01-18 ENCOUNTER — Emergency Department (HOSPITAL_COMMUNITY)
Admission: EM | Admit: 2018-01-18 | Discharge: 2018-01-18 | Disposition: A | Discharge status: Home / Self Care | Payer: Medicare Other | Attending: Emergency Medicine | Admitting: Emergency Medicine

## 2018-01-18 ENCOUNTER — Ambulatory Visit: Payer: Medicare Other | Admitting: Podiatry

## 2018-01-18 ENCOUNTER — Other Ambulatory Visit: Payer: Self-pay

## 2018-01-18 DIAGNOSIS — I10 Essential (primary) hypertension: Secondary | ICD-10-CM | POA: Insufficient documentation

## 2018-01-18 DIAGNOSIS — H2511 Age-related nuclear cataract, right eye: Secondary | ICD-10-CM | POA: Diagnosis not present

## 2018-01-18 DIAGNOSIS — Z79899 Other long term (current) drug therapy: Secondary | ICD-10-CM | POA: Insufficient documentation

## 2018-01-18 DIAGNOSIS — Z96652 Presence of left artificial knee joint: Secondary | ICD-10-CM | POA: Insufficient documentation

## 2018-01-18 DIAGNOSIS — Z87891 Personal history of nicotine dependence: Secondary | ICD-10-CM | POA: Diagnosis not present

## 2018-01-18 DIAGNOSIS — E860 Dehydration: Secondary | ICD-10-CM | POA: Insufficient documentation

## 2018-01-18 DIAGNOSIS — R55 Syncope and collapse: Secondary | ICD-10-CM

## 2018-01-18 DIAGNOSIS — I959 Hypotension, unspecified: Secondary | ICD-10-CM | POA: Diagnosis not present

## 2018-01-18 DIAGNOSIS — R0902 Hypoxemia: Secondary | ICD-10-CM | POA: Diagnosis not present

## 2018-01-18 LAB — CBC WITH DIFFERENTIAL/PLATELET
BASOS ABS: 0 10*3/uL (ref 0.0–0.1)
BASOS PCT: 0 %
Eosinophils Absolute: 0 10*3/uL (ref 0.0–0.7)
Eosinophils Relative: 0 %
HEMATOCRIT: 46.4 % — AB (ref 36.0–46.0)
HEMOGLOBIN: 15.3 g/dL — AB (ref 12.0–15.0)
Lymphocytes Relative: 3 %
Lymphs Abs: 0.3 10*3/uL — ABNORMAL LOW (ref 0.7–4.0)
MCH: 32.1 pg (ref 26.0–34.0)
MCHC: 33 g/dL (ref 30.0–36.0)
MCV: 97.5 fL (ref 78.0–100.0)
MONOS PCT: 3 %
Monocytes Absolute: 0.3 10*3/uL (ref 0.1–1.0)
NEUTROS ABS: 9.8 10*3/uL — AB (ref 1.7–7.7)
NEUTROS PCT: 94 %
Platelets: 218 10*3/uL (ref 150–400)
RBC: 4.76 MIL/uL (ref 3.87–5.11)
RDW: 14 % (ref 11.5–15.5)
WBC: 10.4 10*3/uL (ref 4.0–10.5)

## 2018-01-18 LAB — COMPREHENSIVE METABOLIC PANEL
ALBUMIN: 3.7 g/dL (ref 3.5–5.0)
ALT: 39 U/L (ref 0–44)
AST: 54 U/L — ABNORMAL HIGH (ref 15–41)
Alkaline Phosphatase: 71 U/L (ref 38–126)
Anion gap: 11 (ref 5–15)
BILIRUBIN TOTAL: 0.7 mg/dL (ref 0.3–1.2)
BUN: 44 mg/dL — ABNORMAL HIGH (ref 8–23)
CO2: 29 mmol/L (ref 22–32)
Calcium: 8.9 mg/dL (ref 8.9–10.3)
Chloride: 106 mmol/L (ref 98–111)
Creatinine, Ser: 1.49 mg/dL — ABNORMAL HIGH (ref 0.44–1.00)
GFR calc Af Amer: 38 mL/min — ABNORMAL LOW (ref >60)
GFR calc non Af Amer: 33 mL/min — ABNORMAL LOW (ref >60)
GLUCOSE: 144 mg/dL — AB (ref 70–99)
Potassium: 3.5 mmol/L (ref 3.5–5.1)
Sodium: 146 mmol/L — ABNORMAL HIGH (ref 135–145)
TOTAL PROTEIN: 7 g/dL (ref 6.5–8.1)

## 2018-01-18 LAB — I-STAT TROPONIN, ED: Troponin i, poc: 0 ng/mL (ref 0.00–0.08)

## 2018-01-18 LAB — LIPASE, BLOOD: LIPASE: 44 U/L (ref 11–51)

## 2018-01-18 MED ORDER — SODIUM CHLORIDE 0.9 % IV BOLUS
1000.0000 mL | Freq: Once | INTRAVENOUS | Status: AC
  Administered 2018-01-18: 1000 mL via INTRAVENOUS

## 2018-01-18 NOTE — ED Provider Notes (Signed)
[place] DEPT Provider Note   CSN: [id number] Arrival date & time: 01/18/18  1010     History   Chief Complaint Chief Complaint  Patient presents with  . Nausea  . Emesis    HPI Margaret [name] is a 77 y.o. female.  HPI Patient had cataract repair of the left eye yesterday.  She came home feeling well.  She had a normal dinner.  In the early hours of the morning she awakened with abdominal discomfort and nausea.  She vomited and then went back to bed.  She awakened in the morning and still was not feeling very well.  She reports she had persisting nausea and abdominal discomfort.  She was due to see the ophthalmologist this morning for recheck.  She tried to get up and go into the bathroom but then got feeling faint and lightheaded.  She recognized that she was getting close to passing out and was able to ease herself to the ground without any associated injury.  He denies she had any chest pain, headache or shortness of breath.  At this time, she reports she feels back to normal.  She reports she is thirsty but no longer has abdominal pain or nausea. Past Medical History:  Diagnosis Date  . Arthritis   . History of healed stress fracture 2013   bilateral feet  . Hx of phlebitis    reports remote hx of "superfical blood clots" never anticoagulated  . Hypertension   . Osteopenia     Patient Active Problem List   Diagnosis Date Noted  . Plantar fasciitis of right foot 02/03/2017  . Porokeratosis 02/03/2017  . Acute pancreatitis due to calculus of common bile duct 09/17/2016  . Acute gallstone pancreatitis   . Calculus of gallbladder with acute cholecystitis and obstruction   . LFT elevation   . Stress fracture 01/01/2016  . Insomnia 12/16/2015  . Peripheral neuropathy 10/18/2015  . Renal insufficiency 03/06/2015  . HTN (hypertension) 03/06/2015  . Osteopenia 03/06/2015  . GERD (gastroesophageal reflux disease) 03/06/2015  . Vaginal  pessary present 03/06/2015    Past Surgical History:  Procedure Laterality Date  . CHOLECYSTECTOMY N/A 09/19/2016   Procedure: LAPAROSCOPIC CHOLECYSTECTOMY POSSIBLE INTRAOPERATIVE CHOLANGIOGRAM;  Surgeon: Rolm Bookbinder, MD;  Location: [place];  Service: General;  Laterality: N/A;  . ERCP N/A 09/18/2016   Procedure: ENDOSCOPIC RETROGRADE CHOLANGIOPANCREATOGRAPHY (ERCP);  Surgeon: Doran Stabler, MD;  Location: [facility] ENDOSCOPY;  Service: Endoscopy;  Laterality: N/A;  . FOOT SURGERY     patient reports four foot surgeries  . FOOT SURGERY Left    bunion, hammer toe surgery  . FOOT SURGERY Right    shaved bunion, hammer toe correction  . KNEE ARTHROSCOPY Right 1997  . REPLACEMENT TOTAL KNEE Left   . 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 RETINAL DETACHMENT SURGERY    . RETINAL DETACHMENT SURGERY Left 2008  . THYROIDECTOMY, PARTIAL Right   . THYROIDECTOMY, PARTIAL  2005     OB History    Gravida  4   Para  3   Term  3   Preterm  0   AB  1   Living  3     SAB  1   TAB  0   Ectopic  0   Multiple      Live Births               Home Medications    Prior to Admission medications  Medication Sig Start Date End Date Taking? Authorizing Provider  Besifloxacin HCl (BESIVANCE OP) Place 1 drop into the left eye 3 (three) times daily.   Yes [provider]  Calcium Carb-Cholecalciferol (CALCIUM 600 + D) 600-200 MG-UNIT TABS Take 1 tablet by mouth 2 (two) times daily. 10/09/16  Yes Copland, Gay Filler, MD  Difluprednate (DUREZOL OP) Place 1 drop into the left eye 3 (three) times daily.   Yes [provider]  Docusate Sodium (STOOL SOFTENER LAXATIVE PO) Take 1 tablet by mouth daily.   Yes [provider]  LORazepam (ATIVAN) 2 MG tablet Take 1 tablet (2 mg total) by mouth at bedtime as needed. for anxiety 11/24/17  Yes Copland, Gay Filler, MD  metoprolol succinate (TOPROL-XL) 25 MG 24 hr tablet Take 1 tablet (25 mg total) by mouth daily. 01/28/17  Yes  Copland, Gay Filler, MD  Multiple Vitamins-Minerals (WOMENS DAILY FORMULA) TABS Take 1 tablet by mouth daily.   Yes [provider]  naproxen sodium (ALEVE) 220 MG tablet Take 220 mg by mouth 2 (two) times daily.   Yes [provider]  omeprazole (PRILOSEC) 20 MG capsule TAKE 1 CAPSULE(20 MG) BY MOUTH DAILY Patient taking differently: Take 20 mg by mouth daily.  10/26/17  Yes Copland, Gay Filler, MD  pregabalin (LYRICA) 75 MG capsule Take 1 capsule (75 mg total) by mouth 2 (two) times daily. 09/14/17  Yes Trula Slade, DPM  triamterene-hydrochlorothiazide (MAXZIDE-25) 37.5-25 MG tablet TAKE 1 TABLET BY MOUTH EVERY DAY 12/06/17  Yes Copland, Gay Filler, MD  NONFORMULARY OR COMPOUNDED ITEM Apply 1-2 g topically 3 (three) times daily. Patient not taking: Reported on 01/18/2018 01/14/17   Trula Slade, DPM    Family History Family History  Problem Relation Age of Onset  . Heart disease Mother   . Heart attack Mother   . Heart disease Father   . Diabetes Maternal Grandmother   . Cancer Paternal Grandfather        stomach    Social History Social History   Tobacco Use  . Smoking status: Former Smoker    Last attempt to quit: 06/02/1979    Years since quitting: 38.6  . Smokeless tobacco: Never Used  Substance Use Topics  . Alcohol use: Yes    Alcohol/week: 1.0 standard drinks    Types: 1 Glasses of wine per week    Comment: Occ  . Drug use: No     Allergies   Percocet [oxycodone-acetaminophen]   Review of Systems Review of Systems 10 Systems reviewed and are negative for acute change except as noted in the HPI.   Physical Exam Updated Vital Signs BP 106/67 (BP Location: Left Arm)   Pulse 88   Temp 98.3 F (36.8 C) (Oral)   Resp 18   Ht 5\' 1"  (1.549 m)   Wt 78 kg   SpO2 98%   BMI 32.50 kg/m   Physical Exam  Constitutional: She is oriented to person, place, and time. She appears well-developed and well-nourished.  HENT:  Head: Normocephalic  and atraumatic.  Mouth/Throat: Oropharynx is clear and moist.  Eyes: Pupils are equal, round, and reactive to light. EOM are normal.  Left eye has a clear eye guard over it.  No erythema, swelling or drainage to external inspection.  Her ocular motion is normal.  Neck: Neck supple.  Cardiovascular: Normal rate, regular rhythm, normal heart sounds and intact distal pulses.  Pulmonary/Chest: Effort normal and breath sounds normal.  Abdominal: Soft. Bowel sounds  are normal. She exhibits no distension. There is no tenderness.  Musculoskeletal: Normal range of motion. She exhibits no edema or tenderness.  Neurological: She is alert and oriented to person, place, and time. She has normal strength. No cranial nerve deficit. She exhibits normal muscle tone. Coordination normal. GCS eye subscore is 4. GCS verbal subscore is 5. GCS motor subscore is 6.  Skin: Skin is warm, dry and intact.  Psychiatric: She has a normal mood and affect.     ED Treatments / Results  Labs (all labs ordered are listed, but only abnormal results are displayed) Labs Reviewed  COMPREHENSIVE METABOLIC PANEL - Abnormal; Notable for the following components:      Result Value   Sodium 146 (*)    Glucose, Bld 144 (*)    BUN 44 (*)    Creatinine, Ser 1.49 (*)    AST 54 (*)    GFR calc non Af Amer 33 (*)    GFR calc Af Amer 38 (*)    All other components within normal limits  CBC WITH DIFFERENTIAL/PLATELET - Abnormal; Notable for the following components:   Hemoglobin 15.3 (*)    HCT 46.4 (*)    Neutro Abs 9.8 (*)    Lymphs Abs 0.3 (*)    All other components within normal limits  LIPASE, BLOOD  I-STAT TROPONIN, ED    EKG EKG Interpretation  Date/Time:  Tuesday January 18 2018 12:43:23 EDT Ventricular Rate:  87 PR Interval:    QRS Duration: 100 QT Interval:  369 QTC Calculation: 444 R Axis:   -43 Text Interpretation:  Sinus rhythm Left anterior fascicular block Nonspecific T abnormalities, anterior leads  agree, old EKG 2014  with anterior T wave abnormality. Confirmed by Charlesetta Shanks [phone]) on 01/18/2018 2:54:50 PM   Radiology No results found.  Procedures Procedures (including critical care time)  Medications Ordered in ED Medications  sodium chloride 0.9 % bolus 1,000 mL (1,000 mLs Intravenous New Bag/Given 01/18/18 1344)     Initial Impression / Assessment and Plan / ED Course  I have reviewed the triage vital signs and the nursing notes.  Pertinent labs & imaging results that were available during my care of the patient were reviewed by me and considered in my medical decision making (see chart for details).    Consult: Reviewed with Dr. Talbert Forest.  Instructed for the patient to take her to drops that she has in her possession.  He will see her when she is discharged from the emergency department for intraocular pressure evaluation.  Does not suggest any further exam or pressure measuring in the emergency department.  Final Clinical Impressions(s) / ED Diagnoses   Final diagnoses:  Syncope and collapse  Dehydration   Patient had one episode of nausea and vomiting during the night.  He denies headache, chest pain, shortness of breath.  She continued to be nauseated this morning.  She had a episode of getting lightheaded and recognizing that she was about to pass out.  She reports easing herself to the ground without injury.  She denies any symptoms on arrival.  She denies pain or ongoing nausea or abdominal pain.  Diagnostic studies do indicate patient to be slightly dehydrated.  He has been up and ambulatory about the emergency department without lightheadedness or syncope.  She was rehydrated with 1 L of fluids.  She is counseled on oral intake over the next 24 hours.  Likely dehydration is secondary to recent n.p.o. status for her cataract surgery.  Patient will be discharged and is instructed to go directly to ophthalmology for follow-up. ED Discharge Orders    None         Charlesetta Shanks, MD 01/18/18 [phone]

## 2018-01-18 NOTE — ED Notes (Signed)
Bed: CE02 Expected date:  Expected time:  Means of arrival:  Comments: EMS N/V/D

## 2018-01-18 NOTE — ED Notes (Signed)
Patient ambulated to bathroom with steady gait.

## 2018-01-18 NOTE — Discharge Instructions (Addendum)
1.  Go directly to your eye doctor's office from the emergency department. 2.  Get an appointment with your family doctor for recheck as soon as possible this week. 3.  Increase your intake of fluids.

## 2018-01-18 NOTE — ED Triage Notes (Signed)
Patient arrives with c/o N/V that started at 3am. Patient had cataract surgery yesterday, went home, and started had N/V. Family reports syncopal episode this morning, patient reports hitting head. No complaints of pain at this time. 544ml bolus given by EMS.   BP: 105/42 HR: 81 RR: 18 CBG: 173 SPO2: 95%  18G right AC

## 2018-01-20 ENCOUNTER — Telehealth: Payer: Self-pay

## 2018-01-20 NOTE — Telephone Encounter (Signed)
Called patient to schedule ED follow up visit. States she and her driver/daughter are not feeling well right now and she will call back and schedule later.

## 2018-01-24 ENCOUNTER — Encounter: Payer: Self-pay | Admitting: Podiatry

## 2018-01-24 ENCOUNTER — Ambulatory Visit (INDEPENDENT_AMBULATORY_CARE_PROVIDER_SITE_OTHER): Payer: Medicare Other | Admitting: Podiatry

## 2018-01-24 DIAGNOSIS — G629 Polyneuropathy, unspecified: Secondary | ICD-10-CM

## 2018-01-24 DIAGNOSIS — Q828 Other specified congenital malformations of skin: Secondary | ICD-10-CM | POA: Diagnosis not present

## 2018-01-26 NOTE — Progress Notes (Signed)
Subjective: Margaret [name] presents the office today for concerns of painful calluses to both of her feet. Denies any systemic complaints such as fevers, chills, nausea, vomiting. No acute changes since last appointment, and no other complaints at this time.   Since I last saw her she has fallen. Her PCP is aware.   Objective: AAO x3, NAD DP/PT pulses palpable bilaterally, CRT less than 3 seconds Prominent metatarsal heads plantarly with atrophy of the fat pad. 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  Hyperkeratotic lesions to the right foot submetatarsal area as well as the arch is well as the left foot submetatarsal 1. There is no underlying ulceration, drainage or any signs of infection.  Nails without any pain today and she recently had a pedicure. No open lesions or pre-ulcerative lesions.  No pain with calf compression, swelling, warmth, erythema  Assessment: 77 year old female symptomatic hyperkeratotic lesions  Plan: -All treatment options discussed with the patient including all alternatives, risks, complications.  -Hyperkeratotic lesions are sharply debrided 3 without any complications or bleeding -Encouraged her to follow up with her PCP in regards to her falling.  -Follow-up in 1 month at her request or sooner if needed.  Celesta Gentile, DPM

## 2018-02-01 ENCOUNTER — Other Ambulatory Visit: Payer: Self-pay | Admitting: Family Medicine

## 2018-02-15 NOTE — Progress Notes (Signed)
[facility] [address], [place], [place] [address] [phone]  Date:  02/16/2018   Name:  Margaret [name]   DOB:  [birth year]   MRN:  [id number]  PCP:  Darreld Mclean, MD    Chief Complaint: Depression (loss of husband, comes and goes, feeling nervous) and Arthritis (joint pains, knee, hand and back pain, taking aleve but not helping, on celebrex years ago)   History of Present Illness:  Margaret [name] is a 77 y.o. very pleasant female patient who presents with the following:  Following up today- She lost her husband in February which was really hard on her  I last saw her in June-  She reports that she stayed on lorazepam. Did not end up stopping her ativan and changing to trazodone as we had discussed  Report that she is taking 1.5 pills at bedtime = 3 mg.  Advised her to stop taking more than 2 mg at a time given her age - she states understanding Reports that she is not taking any lorazepam except at bedtime////// Shaquel is doing a bit better as far as adjusting to being without Margaret [name].   Her daughter Margaret [name] is staying with her and helping her out. She has sold her home and is living with Alleigh for now- they are planning a trip to visit [place] later this summer which they are both looking forward to She did balance classes at the [facility] [facility] [employer] last year and this did help her- would like to do this again later this year   She was then in the ER last month with a syncopal episode following cataract surgery: Patient had one episode of nausea and vomiting during the night.  He denies headache, chest pain, shortness of breath.  She continued to be nauseated this morning.  She had a episode of getting lightheaded and recognizing that she was about to pass out.  She reports easing herself to the ground without injury.  She denies any symptoms on arrival.  She denies pain or ongoing nausea or abdominal pain.  Diagnostic  studies do indicate patient to be slightly dehydrated.  He has been up and ambulatory about the emergency department without lightheadedness or syncope.  She was rehydrated with 1 L of fluids.  She is counseled on oral intake over the next 24 hours.  Likely dehydration is secondary to recent n.p.o. status for her cataract surgery.  Patient will be discharged and is instructed to go directly to ophthalmology for follow-up.  Flu:  Give today   Margaret [name] reports that her depression is getting worse again over the last month or so She has anhedonia, poor appetite She is feeling nervous  She is feeling "lost, alone,  I cry all the time."  Her daughter is still living with her which is "a blessing" They also recently got a small dog which cheers her up some She is not sleeping that well either   She is taking loraepam at bedtime  She is taking just 1/2 of her maxzide and this is sufficient to control he BP   She was on zoloft last year for a while but does not really remember how this did for her or why she stopped taking it  She would be ok with starting on a new med for depression at this time  She denies any SI   She does have a metatarsal stress fracture in her right foot and  is wearing a boot right now as well   Wt Readings from Last 3 Encounters:  02/16/18 166 lb (75.3 kg)  01/18/18 172 lb (78 kg)  11/24/17 175 lb (79.4 kg)    BP Readings from Last 3 Encounters:  02/16/18 132/80  01/18/18 (!) 124/94  11/24/17 114/68   No SI   Patient Active Problem List   Diagnosis Date Noted  . Plantar fasciitis of right foot 02/03/2017  . Porokeratosis 02/03/2017  . Acute pancreatitis due to calculus of common bile duct 09/17/2016  . Acute gallstone pancreatitis   . Calculus of gallbladder with acute cholecystitis and obstruction   . LFT elevation   . Stress fracture 01/01/2016  . Insomnia 12/16/2015  . Peripheral neuropathy 10/18/2015  . Renal insufficiency 03/06/2015  . HTN  (hypertension) 03/06/2015  . Osteopenia 03/06/2015  . GERD (gastroesophageal reflux disease) 03/06/2015  . Vaginal pessary present 03/06/2015    Past Medical History:  Diagnosis Date  . Arthritis   . History of healed stress fracture 2013   bilateral feet  . Hx of phlebitis    reports remote hx of "superfical blood clots" never anticoagulated  . Hypertension   . Osteopenia     Past Surgical History:  Procedure Laterality Date  . CHOLECYSTECTOMY N/A 09/19/2016   Procedure: LAPAROSCOPIC CHOLECYSTECTOMY POSSIBLE INTRAOPERATIVE CHOLANGIOGRAM;  Surgeon: Rolm Bookbinder, MD;  Location: [place];  Service: General;  Laterality: N/A;  . ERCP N/A 09/18/2016   Procedure: ENDOSCOPIC RETROGRADE CHOLANGIOPANCREATOGRAPHY (ERCP);  Surgeon: Doran Stabler, MD;  Location[facility] END[facility]COPY;  Service: Endoscopy;  Laterality: N/A;  . FOOT SURGERY     patient reports four foot surgeries  . FOOT SURGERY Left    bunion, hammer toe surgery  . FOOT SURGERY Right    shaved bunion, hammer toe correction  . KNEE ARTHROSCOPY Right 1997  . REPLACEMENT TOTAL KNEE Left   . 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 RETINAL DETACHMENT SURGERY    . RETINAL DETACHMENT SURGERY Left 2008  . THYROIDECTOMY, PARTIAL Right   . THYROIDECTOMY, PARTIAL  2005    Social History   Tobacco Use  . Smoking status: Former Smoker    Last attempt to quit: 06/02/1979    Years since quitting: 38.7  . Smokeless tobacco: Never Used  Substance Use Topics  . Alcohol use: Yes    Alcohol/week: 1.0 standard drinks    Types: 1 Glasses of wine per week    Comment: Occ  . Drug use: No    Family History  Problem Relation Age of Onset  . Heart disease Mother   . Heart attack Mother   . Heart disease Father   . Diabetes Maternal Grandmother   . Cancer Paternal Grandfather        stomach    Allergies  Allergen Reactions  . Percocet [Oxycodone-Acetaminophen] Other (See Comments)    Pt. reports that the medication causes her to be  light headed and pass out.    Medication list has been reviewed and updated.  Current Outpatient Medications on File Prior to Visit  Medication Sig Dispense Refill  . Besifloxacin HCl (BESIVANCE OP) Place 1 drop into the left eye 3 (three) times daily.    . Calcium Carb-Cholecalciferol (CALCIUM 600 + D) 600-200 MG-UNIT TABS Take 1 tablet by mouth 2 (two) times daily. 30 each 2  . Difluprednate (DUREZOL OP) Place 1 drop into the left eye 3 (three) times daily.    Mariane Baumgarten Sodium (STOOL SOFTENER LAXATIVE  PO) Take 1 tablet by mouth daily.    Marland Kitchen LORazepam (ATIVAN) 2 MG tablet Take 1 tablet (2 mg total) by mouth at bedtime as needed. for anxiety 30 tablet 3  . metoprolol succinate (TOPROL-XL) 25 MG 24 hr tablet TAKE 1 TABLET(25 MG) BY MOUTH DAILY 90 tablet 1  . Multiple Vitamins-Minerals (WOMENS DAILY FORMULA) TABS Take 1 tablet by mouth daily.    . naproxen sodium (ALEVE) 220 MG tablet Take 220 mg by mouth 2 (two) times daily.    . NONFORMULARY OR COMPOUNDED ITEM Apply 1-2 g topically 3 (three) times daily. 120 each 2  . omeprazole (PRILOSEC) 20 MG capsule TAKE 1 CAPSULE(20 MG) BY MOUTH DAILY (Patient taking differently: Take 20 mg by mouth daily. ) 30 capsule 5  . triamterene-hydrochlorothiazide (MAXZIDE-25) 37.5-25 MG tablet TAKE 1 TABLET BY MOUTH EVERY DAY 90 tablet 1   No current facility-administered medications on file prior to visit.     Review of Systems:  As per HPI- otherwise negative. No fever or chills Some weight loss due to lack of appetite    Physical Examination: Vitals:   02/16/18 1103  BP: 132/80  Pulse: 79  Resp: 16  Temp: 97.7 F (36.5 C)  SpO2: 95%   Vitals:   02/16/18 1103  Weight: 166 lb (75.3 kg)  Height: 5\' 1"  (1.549 m)   Body mass index is 31.37 kg/m. Ideal Body Weight: Weight in (lb) to have BMI = 25: 132  GEN: WDWN, NAD, Non-toxic, A & O x 3, looks well, overweight  HEENT: Atraumatic, Normocephalic. Neck supple. No masses, No LAD. Ears and  Nose: No external deformity. CV: RRR, No M/G/R. No JVD. No thrill. No extra heart sounds. PULM: CTA B, no wheezes, crackles, rhonchi. No retractions. No resp. distress. No accessory muscle use. EXTR: No c/c/e NEURO Normal gait.  PSYCH: Normally interactive. Conversant. Not depressed or anxious appearing.  Calm demeanor.    Assessment and Plan: Grief reaction  Depression, recurrent ([place]) - Plan: venlafaxine XR (EFFEXOR-XR) 37.5 MG 24 hr capsule, DISCONTINUED: venlafaxine XR (EFFEXOR-XR) 37.5 MG 24 hr capsule  Need for influenza vaccination - Plan: Flu vaccine HIGH DOSE PF (Fluzone High dose)  Following up today - depression has gotten worse in the months since her husband died  Will start her on a low dose of effexor xr She continues to take lorazepam at bedtime per her usual routine  Flu shot given today   Signed Lamar Blinks, MD

## 2018-02-16 ENCOUNTER — Encounter: Payer: Self-pay | Admitting: Family Medicine

## 2018-02-16 ENCOUNTER — Ambulatory Visit (INDEPENDENT_AMBULATORY_CARE_PROVIDER_SITE_OTHER): Payer: Medicare Other | Admitting: Family Medicine

## 2018-02-16 VITALS — BP 132/80 | HR 79 | Temp 97.7°F | Resp 16 | Ht 61.0 in | Wt 166.0 lb

## 2018-02-16 DIAGNOSIS — Z23 Encounter for immunization: Secondary | ICD-10-CM

## 2018-02-16 DIAGNOSIS — F339 Major depressive disorder, recurrent, unspecified: Secondary | ICD-10-CM | POA: Diagnosis not present

## 2018-02-16 DIAGNOSIS — F4321 Adjustment disorder with depressed mood: Secondary | ICD-10-CM | POA: Diagnosis not present

## 2018-02-16 MED ORDER — VENLAFAXINE HCL ER 37.5 MG PO CP24: 37.5000 mg | ORAL_CAPSULE | Freq: Every day | ORAL | 6 refills | Status: DC

## 2018-02-16 MED ORDER — VENLAFAXINE HCL ER 37.5 MG PO CP24: 75.0000 mg | ORAL_CAPSULE | Freq: Every day | ORAL | 6 refills | Status: DC

## 2018-02-16 NOTE — Patient Instructions (Signed)
It was good to see you today!  I am sorry that things have been so hard Let's try adding Effexor to your regimen for depression- take this once a day.  Please come and see me in one month to check on how you are doing.  If you are not doing ok in the meantime please let me know!  Flu shot given today

## 2018-02-21 ENCOUNTER — Encounter: Payer: Self-pay | Admitting: Podiatry

## 2018-02-21 ENCOUNTER — Ambulatory Visit (INDEPENDENT_AMBULATORY_CARE_PROVIDER_SITE_OTHER): Payer: Medicare Other | Admitting: Podiatry

## 2018-02-21 DIAGNOSIS — Q828 Other specified congenital malformations of skin: Secondary | ICD-10-CM | POA: Diagnosis not present

## 2018-02-21 DIAGNOSIS — G629 Polyneuropathy, unspecified: Secondary | ICD-10-CM

## 2018-02-24 NOTE — Progress Notes (Signed)
Subjective: [name] presents the office today for concerns of painful calluses to both of her feet.  She said the calluses is not as painful but they are starting to get thick again and she was to have them trimmed.  Denies any systemic complaints such as fevers, chills, nausea, vomiting. No acute changes since last appointment, and no other complaints at this time.   Objective: AAO x3, NAD DP/PT pulses palpable bilaterally, CRT less than 3 seconds Prominent metatarsal heads plantarly with atrophy of the fat pad. 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  Hyperkeratotic lesions to the right foot submetatarsal area as well as the arch is well as the left foot submetatarsal 1. There is no underlying ulceration, drainage or any signs of infection.  Nails without any pain today and she recently had a pedicure. No open lesions or pre-ulcerative lesions.  No pain with calf compression, swelling, warmth, erythema  Assessment: 77 year old female symptomatic hyperkeratotic lesions  Plan: -All treatment options discussed with the patient including all alternatives, risks, complications.  -Hyperkeratotic lesions are sharply debrided 3 without any complications or bleeding  -Follow-up in 1 month at her request or sooner if needed.  Margaret [name], DPM

## 2018-03-01 ENCOUNTER — Other Ambulatory Visit: Payer: Self-pay | Admitting: Obstetrics & Gynecology

## 2018-03-01 DIAGNOSIS — Z1231 Encounter for screening mammogram for malignant neoplasm of breast: Secondary | ICD-10-CM

## 2018-03-02 ENCOUNTER — Other Ambulatory Visit: Payer: Self-pay | Admitting: Family Medicine

## 2018-03-02 DIAGNOSIS — F5102 Adjustment insomnia: Secondary | ICD-10-CM

## 2018-03-04 NOTE — Telephone Encounter (Signed)
Patient is calling Status of her lorzaepam Please advise

## 2018-03-04 NOTE — Telephone Encounter (Signed)
Requesting:Lorazepam Contract:none, needs csc CEQ:FDVO, needs uds Last Visit:02/16/18 Next Visit:03/24/18 Last Refill:11/24/17  Please Advise

## 2018-03-04 NOTE — Telephone Encounter (Signed)
NCC[facility] is not working at this moment. However per my notes RF is ok

## 2018-03-20 NOTE — Progress Notes (Signed)
[place] at [facility] [address] [place], [place] [address] [phone]  Date:  03/24/2018   Name:  Margaret [name]   DOB:  Jul 05, 1940   MRN:  [id number]  PCP:  Darreld Mclean, MD    Chief Complaint: Grief Reaction (1 month follow up, no improvement, medication side effects-shakiness)   History of Present Illness:  Margaret [name] is a 77 y.o. very pleasant female patient who presents with the following:  Short term follow-up today- from her last visit  We started her on Effexor for grief/ depression following the death of her husband [name] Last seen here about a month ago.  Her husband [name] died in 2022/08/26 of this year:  [place] reports that her depression is getting worse again over the last month or so She has anhedonia, poor appetite She is feeling nervous  She is feeling "lost, alone,  I cry all the time."  Her daughter is still living with her which is "a blessing" They also recently got a small dog which cheers her up some She is not sleeping that well either  She is taking loraepam at bedtime  She is taking just 1/2 of her maxzide and this is sufficient to control he BP  She was on zoloft last year for a while but does not really remember how this did for her or why she stopped taking it  She would be ok with starting on a new med for depression at this time  She denies any SI  She does have a metatarsal stress fracture in her right foot and is wearing a boot right now as well /////////////////////// Following up today - depression has gotten worse in the months since her husband died  Will start her on a low dose of effexor xr She continues to take lorazepam at bedtime per her usual routine   She notes that she has developed some hand tremor since going on effexor- she notes this in the am, but it seems to get better as the day goes on. She takes her dose around 9am each day The tremor is just in her  hands and can effect her ability to use her hands to write, etc  However her mood is overall much better- she is adjusting to her new reality. Feeling more cheerful and positive  She is on lorazepam at bedtime for sleep - she is using this daily  She is often taking 1.5 pills or 3 mg at bedtime so 30 per month is not enough  She has been using "sleeping pills for years" Reviewed NCCSR as abelow  NCCSR:  03/07/2018  1   03/04/2018  Lorazepam 2 Mg Tablet  30.00 30 Je Cop  [id number]  Wal ([id number])  0/2 2.00 LME Private Pay  [place]  01/03/2018  1   11/24/2017  Lorazepam 2 Mg Tablet  90.00 90 Je Cop  [id number]  Wal ([id number])  1/3 2.00 LME Comm Ins  [place]  12/10/2017  1   07/26/2017  Lyrica 75 Mg Capsule  60.00 30 Ma Wag  [id number]  Wal ([id number])  0/5 1.00 LME Comm Ins  [place]  11/24/2017  1   11/24/2017  Lorazepam 2 Mg Tablet  30.00 30 Je Cop  [id number]  Wal ([id number])  0/3 2.00 LME Comm Ins    11/13/2017  1   09/14/2017  Lyrica 75 Mg Capsule  60.00 [place]  [id number]  Wal [phone])  2/5 1.00 LME Comm Ins  [place]  10/29/2017  1   10/29/2017  Lorazepam 2 Mg Tablet  30.00 15 Je Cop  [id number]  Wal ([id number])  0/0 4.00 LME Comm Ins  [place]  10/16/2017  1   09/14/2017  Lyrica 75 Mg Capsule  60.00 30 Ma Wag  [id number]  Wal ([id number])  1/5 1.00 LME Comm Ins  [place]  10/04/2017  1   09/15/2017  Lorazepam 2 Mg Tablet  30.00 15 Je Cop  [id number]  Wal ([id number])  1/1 4.00 LME Comm Ins  [place]  09/15/2017  1   09/15/2017  Lorazepam 2 Mg Tablet  30.00 15 Je Cop  [id number]  Wal ([id number])  0/1 4.00 LME Comm Ins  [place]  09/14/2017  1   09/14/2017  Lyrica 75 Mg Capsule  60.00 30 Ma Wag  [id number]  Wal ([id number])  0/5       Patient Active Problem List   Diagnosis Date Noted  . Plantar fasciitis of right foot 02/03/2017  . Porokeratosis 02/03/2017  . Acute pancreatitis due to calculus of common bile duct 09/17/2016  . Acute gallstone pancreatitis   . Calculus of gallbladder with acute cholecystitis and obstruction   . LFT elevation   . Stress fracture 01/01/2016  . Insomnia 12/16/2015  .  Peripheral neuropathy 10/18/2015  . Renal insufficiency 03/06/2015  . HTN (hypertension) 03/06/2015  . Osteopenia 03/06/2015  . GERD (gastroesophageal reflux disease) 03/06/2015  . Vaginal pessary present 03/06/2015    Past Medical History:  Diagnosis Date  . Arthritis   . History of healed stress fracture 2013   bilateral feet  . Hx of phlebitis    reports remote hx of "superfical blood clots" never anticoagulated  . Hypertension   . Osteopenia     Past Surgical History:  Procedure Laterality Date  . CHOLECYSTECTOMY N/A 09/19/2016   Procedure: LAPAROSCOPIC CHOLECYSTECTOMY POSSIBLE INTRAOPERATIVE CHOLANGIOGRAM;  Surgeon: Rolm Bookbinder, MD;  Location: [facility];  Service: General;  Laterality: N/A;  . ERCP N/A 09/18/2016   Procedure: ENDOSCOPIC RETROGRADE CHOLANGIOPANCREATOGRAPHY (ERCP);  Surgeon: Doran Stabler, MD;  Location: [facility] ENDOSCOPY;  Service: Endoscopy;  Laterality: N/A;  . FOOT SURGERY     patient reports four foot surgeries  . FOOT SURGERY Left    bunion, hammer toe surgery  . FOOT SURGERY Right    shaved bunion, hammer toe correction  . KNEE ARTHROSCOPY Right 1997  . REPLACEMENT TOTAL KNEE Left   . 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 RETINAL DETACHMENT SURGERY    . RETINAL DETACHMENT SURGERY Left 2008  . THYROIDECTOMY, PARTIAL Right   . THYROIDECTOMY, PARTIAL  2005    Social History   Tobacco Use  . Smoking status: Former Smoker    Last attempt to quit: 06/02/1979    Years since quitting: 38.8  . Smokeless tobacco: Never Used  Substance Use Topics  . Alcohol use: Yes    Alcohol/week: 1.0 standard drinks    Types: 1 Glasses of wine per week    Comment: Occ  . Drug use: No    Family History  Problem Relation Age of Onset  . Heart disease Mother   . Heart attack Mother   . Heart disease Father   . Diabetes Maternal Grandmother   . Cancer Paternal Grandfather        stomach    Allergies  Allergen Reactions  . Percocet  [Oxycodone-Acetaminophen] Other (See Comments)    Pt. reports that the medication causes her  to be light headed and pass out.    Medication list has been reviewed and updated.  Current Outpatient Medications on File Prior to Visit  Medication Sig Dispense Refill  . Calcium Carb-Cholecalciferol (CALCIUM 600 + D) 600-200 MG-UNIT TABS Take 1 tablet by mouth 2 (two) times daily. 30 each 2  . Docusate Sodium (STOOL SOFTENER LAXATIVE PO) Take 1 tablet by mouth daily.    . metoprolol succinate (TOPROL-XL) 25 MG 24 hr tablet TAKE 1 TABLET(25 MG) BY MOUTH DAILY 90 tablet 1  . Multiple Vitamins-Minerals (WOMENS DAILY FORMULA) TABS Take 1 tablet by mouth daily.    . naproxen sodium (ALEVE) 220 MG tablet Take 220 mg by mouth 2 (two) times daily.    Marland Kitchen omeprazole (PRILOSEC) 20 MG capsule TAKE 1 CAPSULE(20 MG) BY MOUTH DAILY (Patient taking differently: Take 20 mg by mouth daily. ) 30 capsule 5  . triamterene-hydrochlorothiazide (MAXZIDE-25) 37.5-25 MG tablet TAKE 1 TABLET BY MOUTH EVERY DAY 90 tablet 1  . venlafaxine XR (EFFEXOR-XR) 37.5 MG 24 hr capsule Take 1 capsule (37.5 mg total) by mouth daily with breakfast. (Patient taking differently: Take 75 mg by mouth daily with breakfast. ) 30 capsule 6   No current facility-administered medications on file prior to visit.     Review of Systems:  As per HPI- otherwise negative. No fever or chills No other neuro sx besides her hand tremor    Physical Examination: Vitals:   03/24/18 1040  BP: 120/88  Pulse: 73  Resp: 16  Temp: 97.7 F (36.5 C)  SpO2: 98%   Vitals:   03/24/18 1040  Weight: 168 lb (76.2 kg)  Height: 5\' 1"  (1.549 m)   Body mass index is 31.74 kg/m. Ideal Body Weight: Weight in (lb) to have BMI = 25: 132  GEN: WDWN, NAD, Non-toxic, A & O x 3, mild obesity, looks well Well dressed and groomed today, looks like her normal self  HEENT: Atraumatic, Normocephalic. Neck supple. No masses, No LAD. Ears and Nose: No external  deformity. CV: RRR, No M/G/R. No JVD. No thrill. No extra heart sounds. PULM: CTA B, no wheezes, crackles, rhonchi. No retractions. No resp. distress. No accessory muscle use. EXTR: No c/c/e NEURO Normal gait.  She has a tremor in both hands. The right is worse.  Present at rest and with intention both  PSYCH: Normally interactive. Conversant. Not depressed or anxious appearing.  Calm demeanor.    Assessment and Plan: Grief reaction  Essential hypertension  Adjustment insomnia - Plan: LORazepam (ATIVAN) 2 MG tablet  Tremor of both hands  Following up today Her husband died in 2022-08-26. Last month we put her on effexor for protracted severe grief/ depression. Her mood is better but the med seems to have caused a tremor.  She is concerned about Parkinson's disease.  Encouraged her to stop the effexor- taper over a week- and we will see if the tremor does not go away. I also am not sure if she will then need another med for depression or not- she will update me in 2-3 weeks  Refilled her lorazepam- she is taking up to 3mg  at bedtime.  Updated her rx to reflect this but encouraged her to limit use as much as she is able   See patient instructions for more details.    Meds ordered this encounter  Medications  . LORazepam (ATIVAN) 2 MG tablet    Sig: Take 1 to 1.5 pills (2 to 3 mg) by mouth at bedtime  as needed for sleep    Dispense:  45 tablet    Refill:  2      Signed Lamar Blinks, MD

## 2018-03-21 ENCOUNTER — Telehealth: Payer: Self-pay

## 2018-03-21 ENCOUNTER — Ambulatory Visit: Payer: Medicare Other | Admitting: Family Medicine

## 2018-03-21 ENCOUNTER — Encounter: Payer: Self-pay | Admitting: Obstetrics & Gynecology

## 2018-03-21 ENCOUNTER — Ambulatory Visit (INDEPENDENT_AMBULATORY_CARE_PROVIDER_SITE_OTHER): Payer: Medicare Other | Admitting: Obstetrics & Gynecology

## 2018-03-21 VITALS — BP 124/78 | HR 80 | Ht 61.0 in | Wt 168.0 lb

## 2018-03-21 DIAGNOSIS — N819 Female genital prolapse, unspecified: Secondary | ICD-10-CM

## 2018-03-21 NOTE — Progress Notes (Signed)
  HPI Pt presents today to have pessary removed and cleaned.  She denies problems and reports that her pessary is working very well.  She denies further complaints.     Review of Systems       Objective:   Physical Exam Pt in NAD GYN: Pessary removed and cleaned; vaginal mucosa looks healty and with no excoriations or breakdown     Assessment:     Pessary check     Plan:     F/u in 3 months to have pessary removed and cleaned F/u sooner prn  Pt to begin recumbent bike at [facility] 5 days per week  Rojean Ige L. Harraway-Smith, M.D., Cherlynn June

## 2018-03-21 NOTE — Telephone Encounter (Signed)
Patient came today for pessary check. Patient states that she is having some leaking since this morning and having to wear a pad. Patient states she can feel the pessary more than she could before. Will route to provider for input. Kathrene Alu RN

## 2018-03-24 ENCOUNTER — Encounter: Payer: Self-pay | Admitting: Family Medicine

## 2018-03-24 ENCOUNTER — Ambulatory Visit (INDEPENDENT_AMBULATORY_CARE_PROVIDER_SITE_OTHER): Payer: Medicare Other | Admitting: Family Medicine

## 2018-03-24 VITALS — BP 120/88 | HR 73 | Temp 97.7°F | Resp 16 | Ht 61.0 in | Wt 168.0 lb

## 2018-03-24 DIAGNOSIS — I10 Essential (primary) hypertension: Secondary | ICD-10-CM

## 2018-03-24 DIAGNOSIS — F5102 Adjustment insomnia: Secondary | ICD-10-CM | POA: Diagnosis not present

## 2018-03-24 DIAGNOSIS — R251 Tremor, unspecified: Secondary | ICD-10-CM | POA: Diagnosis not present

## 2018-03-24 DIAGNOSIS — F4321 Adjustment disorder with depressed mood: Secondary | ICD-10-CM

## 2018-03-24 MED ORDER — LORAZEPAM 2 MG PO TABS: ORAL_TABLET | ORAL | 2 refills | Status: DC

## 2018-03-24 NOTE — Patient Instructions (Signed)
So good to see you as always!  I suspect that the effexor is causing your new tremor Take it every other day for one week and then stop See if the tremor resolves, and monitor your mood.  Please call me with an update in 2-3 weeks I increased your monthly allotment of lorazepam to #45 so you can take up to 1.5 tablets at bedime if needed for insomnia However, please try to take just 1 pill as much as you are able- we would like you using as little of this medication as you are able to reduce dependence. Do not combine with alcohol

## 2018-03-28 ENCOUNTER — Ambulatory Visit: Payer: Medicare Other | Admitting: Podiatry

## 2018-03-28 ENCOUNTER — Ambulatory Visit: Payer: Medicare Other | Admitting: Family Medicine

## 2018-03-31 ENCOUNTER — Ambulatory Visit (HOSPITAL_BASED_OUTPATIENT_CLINIC_OR_DEPARTMENT_OTHER)
Admission: RE | Admit: 2018-03-31 | Discharge: 2018-03-31 | Disposition: A | Discharge status: Home / Self Care | Payer: Medicare Other | Source: Ambulatory Visit | Attending: Obstetrics & Gynecology | Admitting: Obstetrics & Gynecology

## 2018-03-31 DIAGNOSIS — Z1231 Encounter for screening mammogram for malignant neoplasm of breast: Secondary | ICD-10-CM | POA: Insufficient documentation

## 2018-04-02 ENCOUNTER — Other Ambulatory Visit: Payer: Self-pay | Admitting: Family Medicine

## 2018-04-04 ENCOUNTER — Encounter: Payer: Self-pay | Admitting: Podiatry

## 2018-04-04 ENCOUNTER — Ambulatory Visit (INDEPENDENT_AMBULATORY_CARE_PROVIDER_SITE_OTHER): Payer: Medicare Other | Admitting: Podiatry

## 2018-04-04 DIAGNOSIS — Q828 Other specified congenital malformations of skin: Secondary | ICD-10-CM

## 2018-04-04 DIAGNOSIS — M7751 Other enthesopathy of right foot: Secondary | ICD-10-CM | POA: Diagnosis not present

## 2018-04-04 DIAGNOSIS — G629 Polyneuropathy, unspecified: Secondary | ICD-10-CM | POA: Diagnosis not present

## 2018-04-04 DIAGNOSIS — M779 Enthesopathy, unspecified: Secondary | ICD-10-CM

## 2018-04-06 NOTE — Progress Notes (Signed)
Subjective: Margaret [name] presents the office today for concerns of painful calluses to both of her feet.  She was of the calluses trimmed is also requesting steroid injection to the right foot.  She is been having some chronic ongoing pain for quite some time the also aspect of the foot.  She has a history of stress fractures but she denies any increase in swelling or warmth and no recent injury.  She went to proceed with a stress fracture to the right foot today.  No acute changes since last appointment, and no other complaints at this time.   Objective: AAO x3, NAD DP/PT pulses palpable bilaterally, CRT less than 3 seconds Prominent metatarsal heads plantarly with atrophy of the fat pad. 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  Hyperkeratotic lesions to the right foot submetatarsal area as well as the arch is well as the left foot submetatarsal 1. There is no underlying ulceration, drainage or any signs of infection.  Tenderness palpation of the fifth metatarsal base proximally along the insertion of the peroneal tendon.  Peroneal tendon appears to be intact.  There is no edema, erythema.  No pain to vibratory sensation. No open lesions or pre-ulcerative lesions.  No pain with calf compression, swelling, warmth, erythema  Assessment: 77 year old female symptomatic hyperkeratotic lesions; tendinitis  Plan: -All treatment options discussed with the patient including all alternatives, risks, complications.  -Hyperkeratotic lesions are sharply debrided 3 without any complications or bleeding  -She is requesting steroid injection of the right foot today.  See procedure note below.  She tolerated well.  Recommend wearing supportive shoes.  Ice to the area as well. Wanted to hold off on new x-rays today  Procedure: Injection Tendon/Ligament Discussed alternatives, risks, complications and verbal consent was obtained.  Location: Right fifth metatarsal base Skin  Prep: Alcohol. Injectate: 0.5cc 0.5% marcaine plain, 0.5 cc dexamethasone phosphate Disposition: Patient tolerated procedure well. Injection site dressed with a band-aid.  Post-injection care was discussed and return precautions discussed.   Trula Slade DPM   Celesta Gentile, DPM

## 2018-04-12 ENCOUNTER — Telehealth: Payer: Self-pay | Admitting: Family Medicine

## 2018-04-12 DIAGNOSIS — R350 Frequency of micturition: Secondary | ICD-10-CM

## 2018-04-12 NOTE — Telephone Encounter (Signed)
Ok thanks [place]!

## 2018-04-12 NOTE — Telephone Encounter (Signed)
Copied from [place] [id number]. Topic: Quick Communication - See Telephone Encounter >> Apr 12, 2018  3:22 PM Sheran Luz wrote: CRM for notification. See Telephone encounter for: 04/12/18.  Patient called requesting to drop off urine sample to lab, stating she thinks that she has a UTI. Patient said the only symptom she is expereicing right now is increased frequency. Patient would like to know if Dr. [name] could send something into pharmacy for UTI without an appointment or at very least drop off urine sample-as there is no availability today, patient has limited transportation and patient is leaving for [facility] tomorrow (grandson passed away). Please advise.

## 2018-04-12 NOTE — Telephone Encounter (Signed)
In absence of Dr. Lillie Fragmin response, Pryor Curia spoke to DOD in person and DOD approved UA/cx order. Orders placed. Author phoned pt. to notify and let her know that results may not come for a day or two, but Dr. [name] will follow-up on results whlie pt. travelling. No answer, author left detailed VM relaying all of the above.

## 2018-04-12 NOTE — Telephone Encounter (Signed)
Routed to PCP and DOD to advise.

## 2018-04-14 IMAGING — DX DG ABDOMEN 2V
3 series · 3 of 3 positions shown · non-contrast
Comparison: No recent.

CLINICAL DATA: Abdominal pain.

EXAM:
ABDOMEN - 2 VIEW

[abdomen erect]
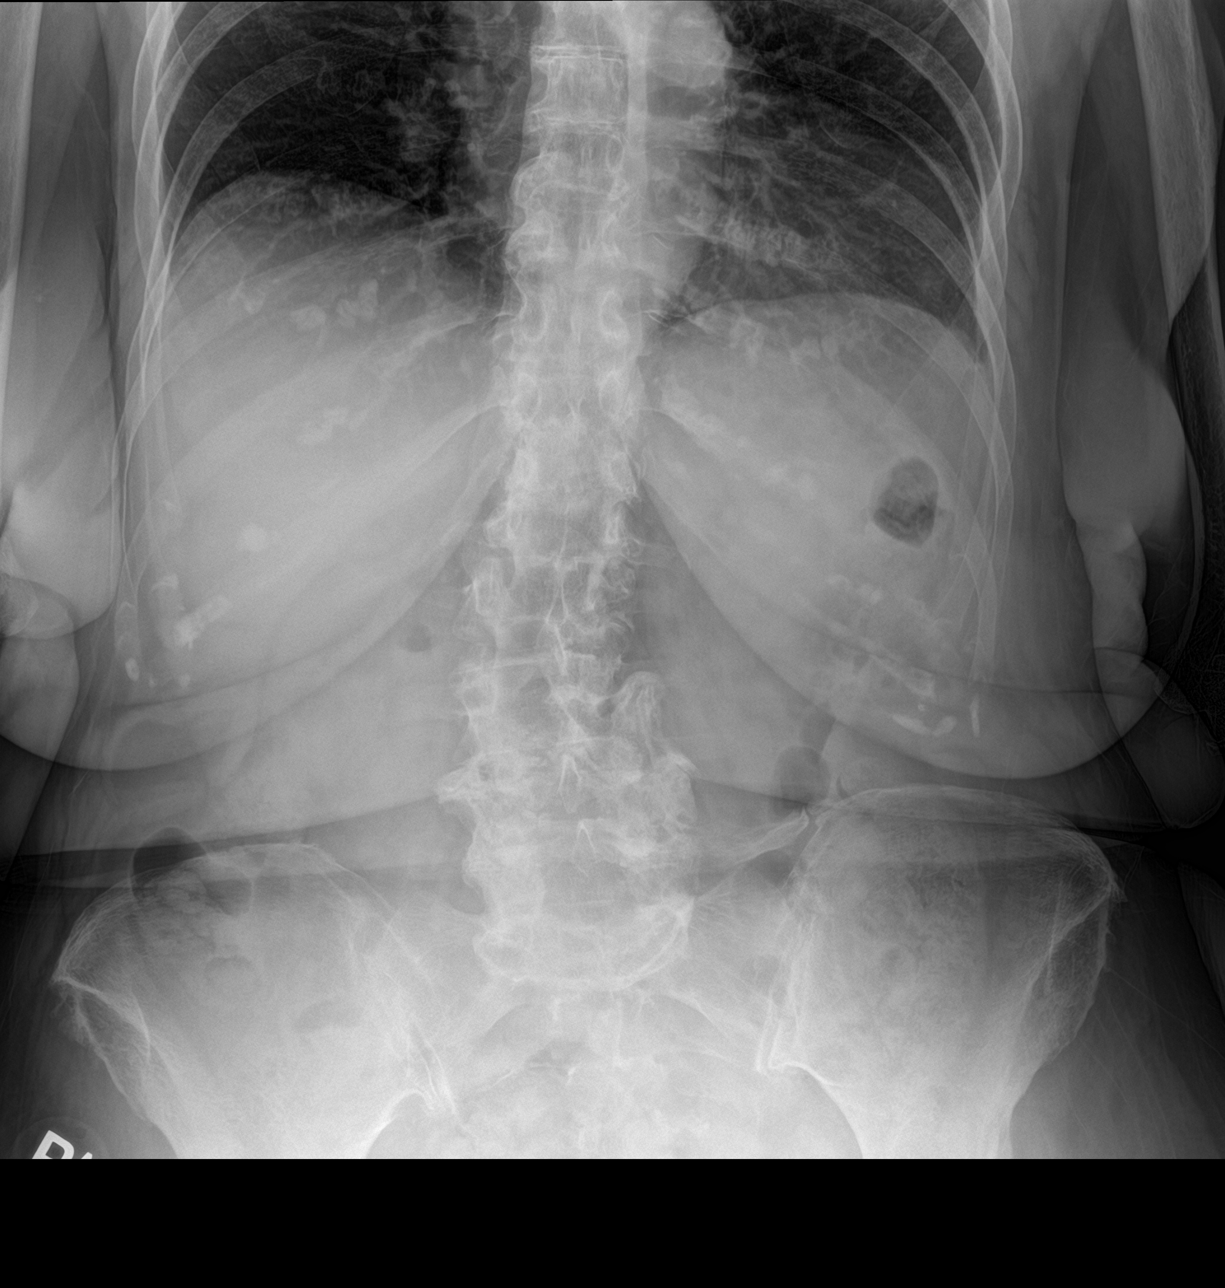

[abdomen supine (1 of 2)]
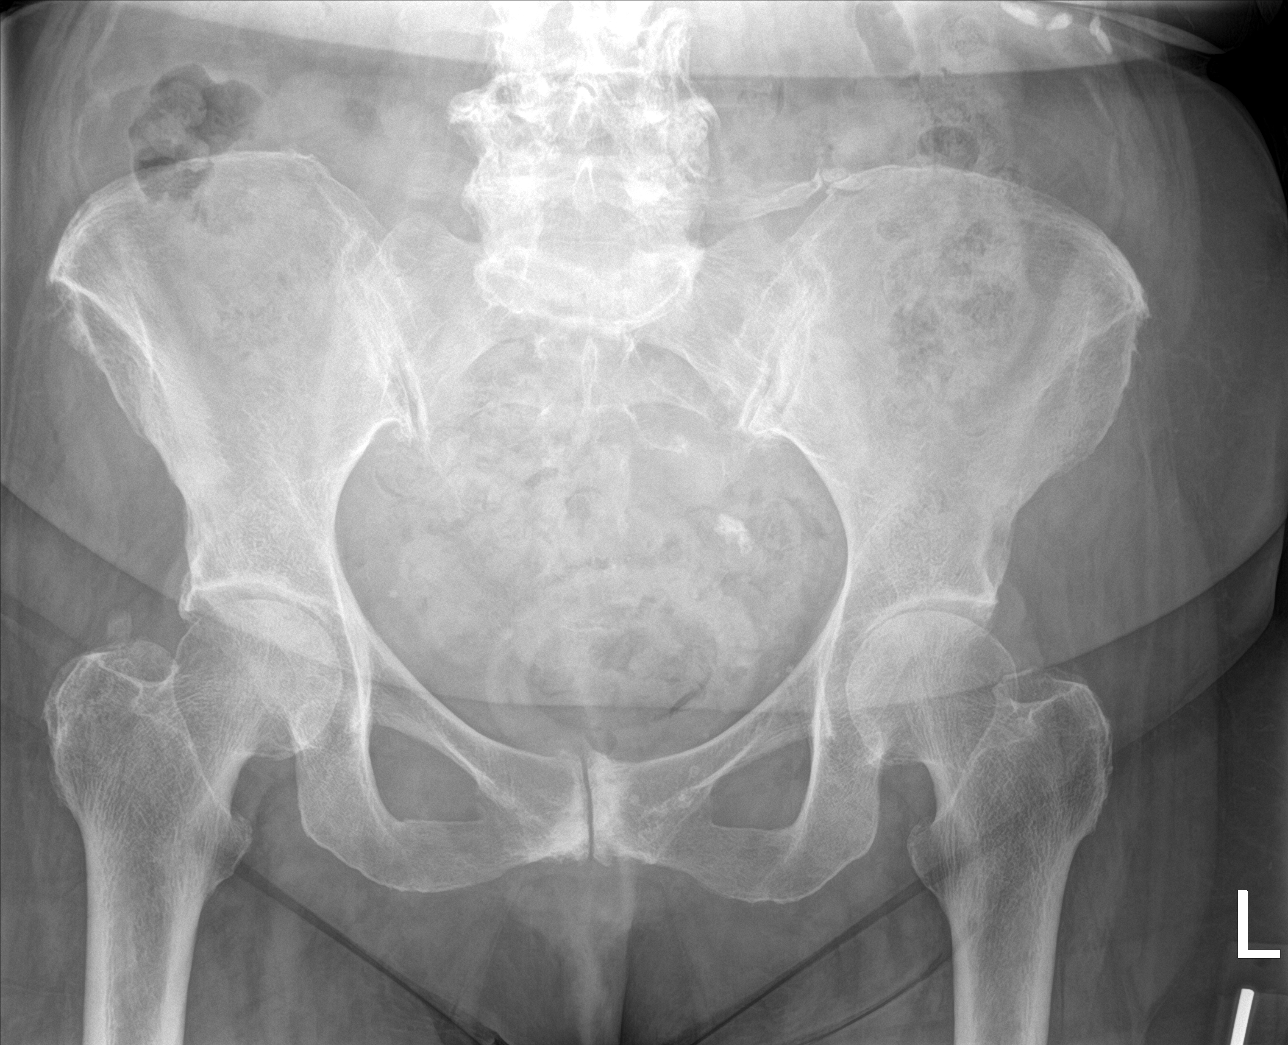

[abdomen supine (2 of 2)]
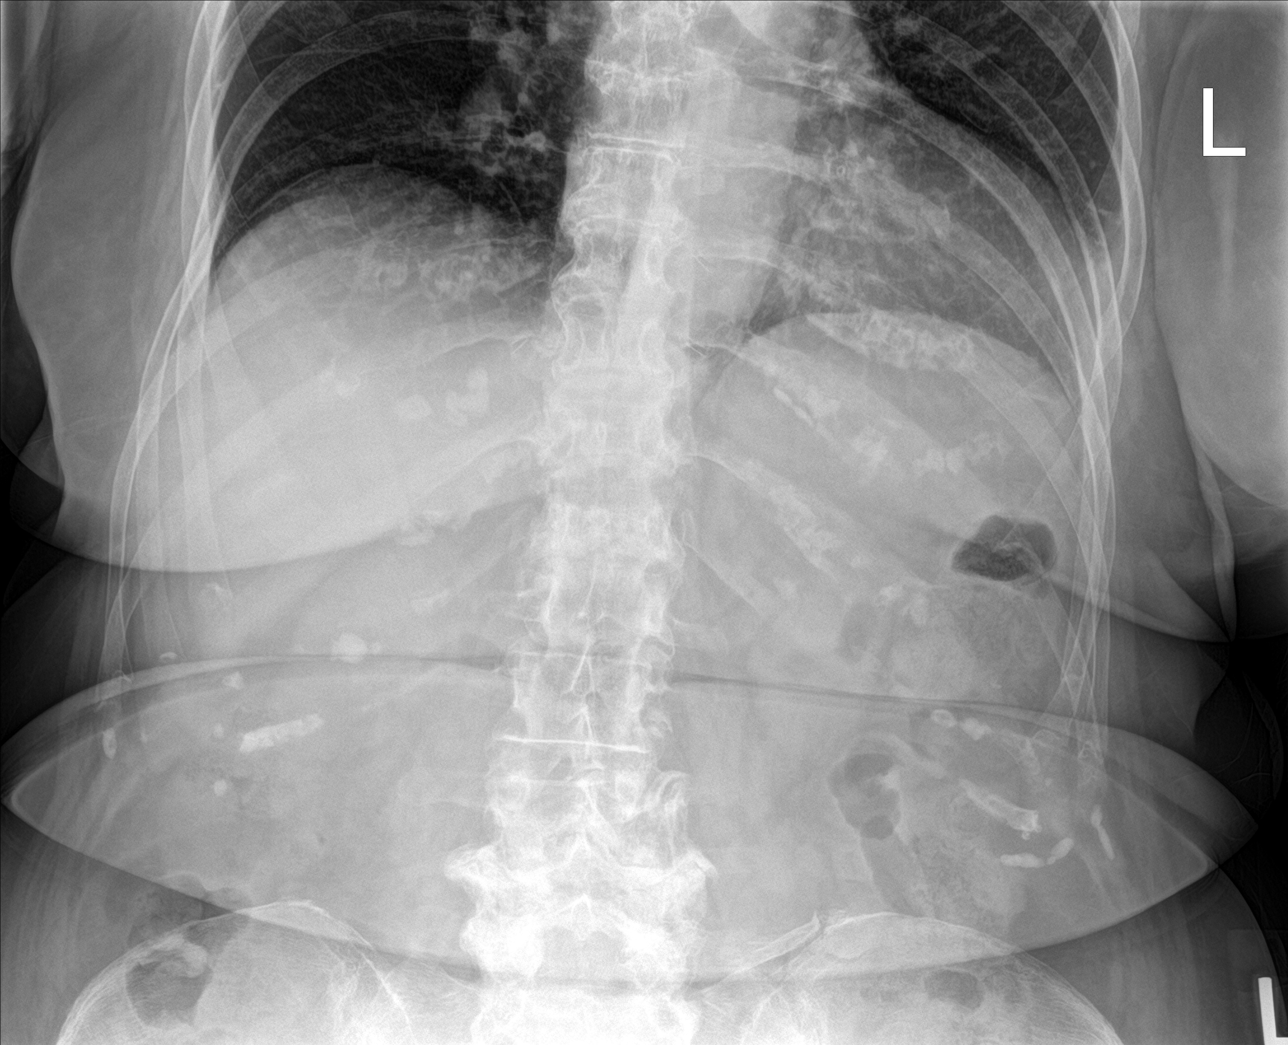

[3 of 3 positions shown; findings below may reference images not displayed]

FINDINGS: Soft tissue structures are unremarkable. No bowel distention.
Moderate stool volume . No free air. Stool noted throughout the
colon. Degenerative changes thoracolumbar spine with scoliosis
concave left. Pelvic calcifications consistent phleboliths.
Calcified fibroids may be present. Pessary noted.
IMPRESSION: Moderate stool volume. Constipation cannot be excluded. No acute
abnormality identified.

## 2018-04-16 IMAGING — RF DG ERCP WO/W SPHINCTEROTOMY
1 series · 5 of 5 positions shown · non-contrast
Comparison: None.

CLINICAL DATA: Sphincterotomy

EXAM:
ERCP
TECHNIQUE: Multiple spot images obtained with the fluoroscopic device and
submitted for interpretation post-procedure.
FLUOROSCOPY TIME:  Fluoroscopy Time:  3 minutes and 16 seconds
Radiation Exposure Index (if provided by the fluoroscopic device):
Number of Acquired Spot Images: 5

[Series 1: run · 5 of 5 slices shown]
[im 1/5]
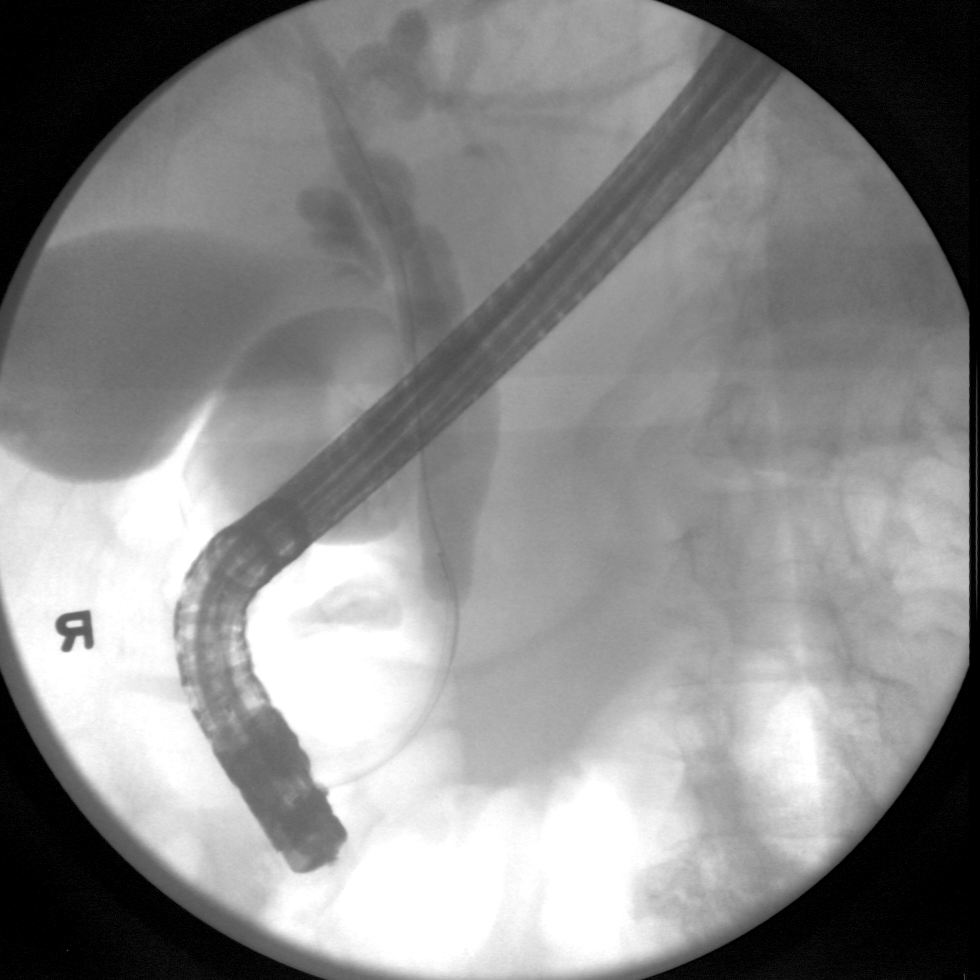
[im 2/5]
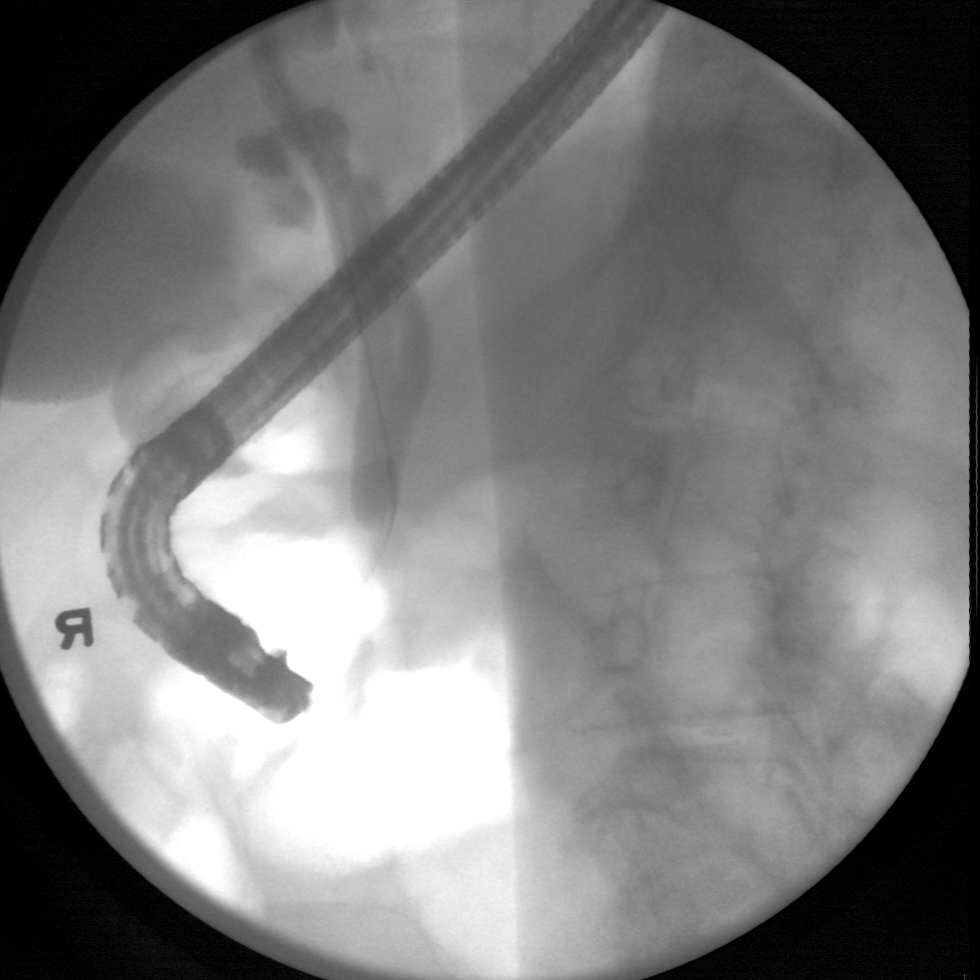
[im 3/5]
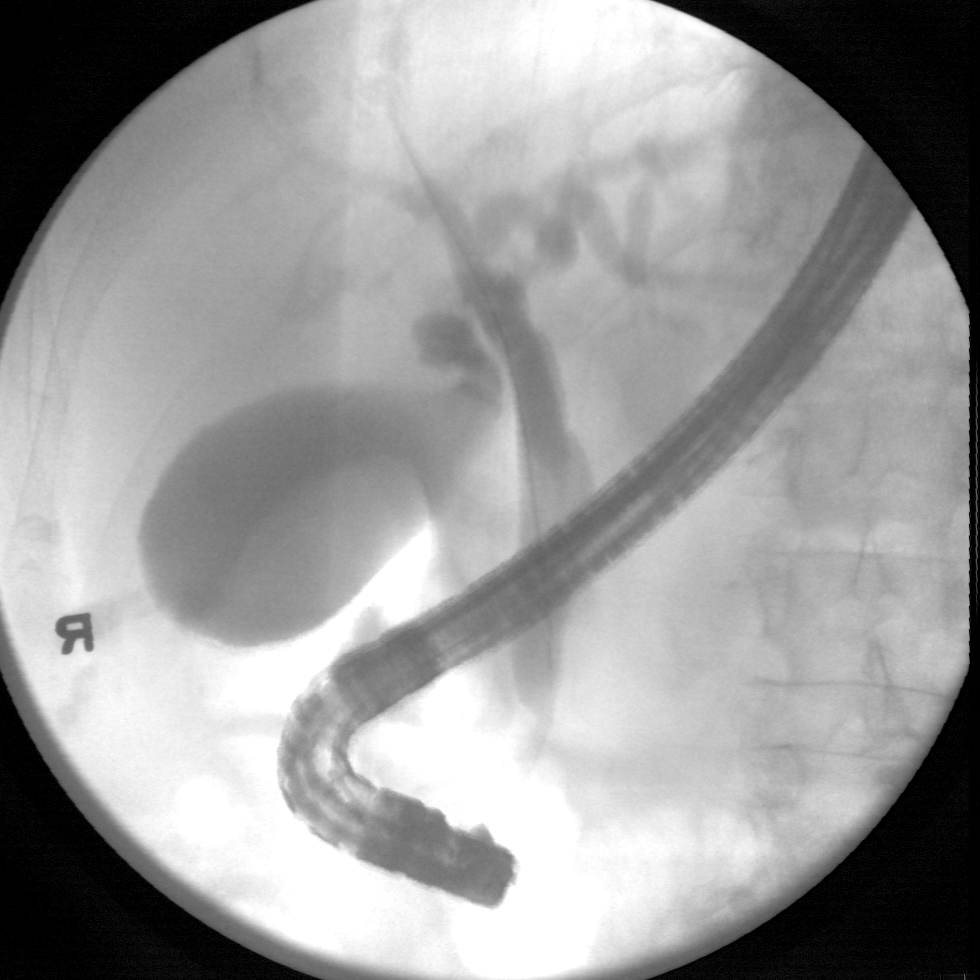
[im 4/5]
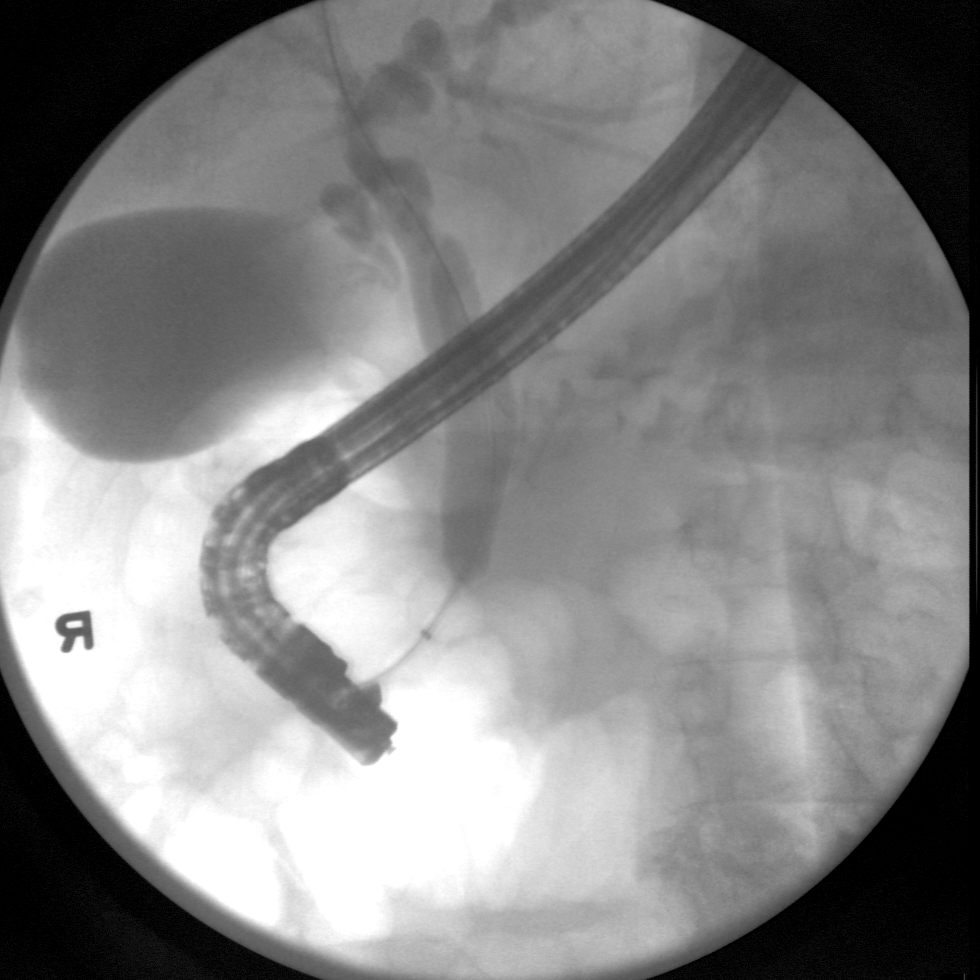
[im 5/5]
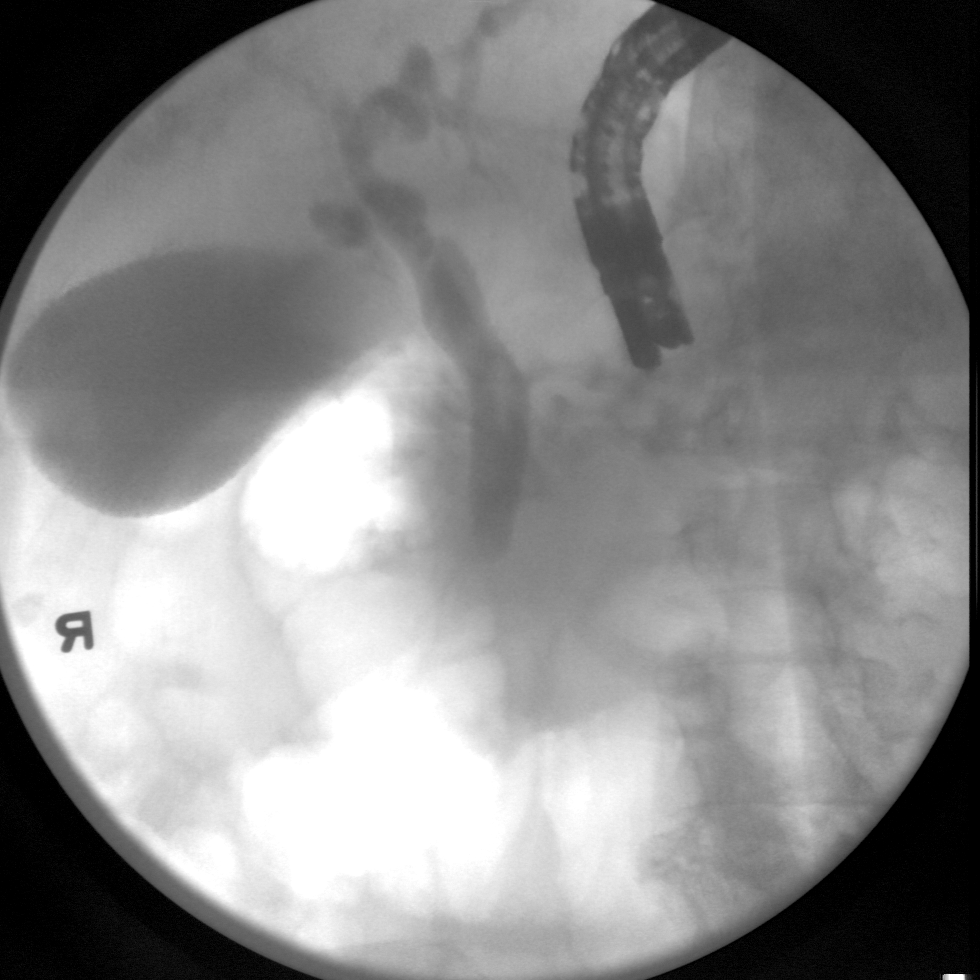

[5 of 5 positions shown; findings below may reference images not displayed]

FINDINGS: Contrast fills the biliary tree. The common bile duct is dilated.
Sphincterotomy was performed.
IMPRESSION: See above.

These images were submitted for radiologic interpretation only.
Please see the procedural report for the amount of contrast and the
fluoroscopy time utilized.

## 2018-04-19 NOTE — Progress Notes (Deleted)
[place] at [facility] [address] [place], [place] [address] [phone]  Date:  04/21/2018   Name:  Margaret [name]   DOB:  Mar 12, 1941   MRN:  [id number]  PCP:  Darreld Mclean, MD    Chief Complaint: No chief complaint on file.   History of Present Illness:  Margaret [name] is a 77 y.o. very pleasant female patient who presents with the following:  Medication recheck today History of HTN, neuropathy, renal insufficiency Her husband Margaret [name] died in Aug 11, 2022 of this year She was last in during October as follows:  She notes that she has developed some hand tremor since going on effexor- she notes this in the am, but it seems to get better as the day goes on. She takes her dose around 9am each day The tremor is just in her hands and can effect her ability to use her hands to write, etc  However her mood is overall much better- she is adjusting to her new reality. Feeling more cheerful and positive  She is on lorazepam at bedtime for sleep - she is using this daily  She is often taking 1.5 pills or 3 mg at bedtime so 30 per month is not enough  She has been using "sleeping pills for years//////////////////////////////// Her husband died in 08/11/22. Last month we put her on effexor for protracted severe grief/ depression. Her mood is better but the med seems to have caused a tremor.  She is concerned about Parkinson's disease.  Encouraged her to stop the effexor- taper over a week- and we will see if the tremor does not go away. I also am not sure if she will then need another med for depression or not- she will update me in 2-3 weeks Refilled her lorazepam- she is taking up to 3mg  at bedtime.  Updated her rx to reflect this but encouraged her to limit use as much as she is able   [place]:  Patient Active Problem List   Diagnosis Date Noted  . Plantar fasciitis of right foot 02/03/2017  . Porokeratosis 02/03/2017  .  Acute pancreatitis due to calculus of common bile duct 09/17/2016  . Acute gallstone pancreatitis   . Calculus of gallbladder with acute cholecystitis and obstruction   . LFT elevation   . Stress fracture 01/01/2016  . Insomnia 12/16/2015  . Peripheral neuropathy 10/18/2015  . Renal insufficiency 03/06/2015  . HTN (hypertension) 03/06/2015  . Osteopenia 03/06/2015  . GERD (gastroesophageal reflux disease) 03/06/2015  . Vaginal pessary present 03/06/2015    Past Medical History:  Diagnosis Date  . Arthritis   . History of healed stress fracture 2013   bilateral feet  . Hx of phlebitis    reports remote hx of "superfical blood clots" never anticoagulated  . Hypertension   . Osteopenia     Past Surgical History:  Procedure Laterality Date  . CHOLECYSTECTOMY N/A 09/19/2016   Procedure: LAPAROSCOPIC CHOLECYSTECTOMY POSSIBLE INTRAOPERATIVE CHOLANGIOGRAM;  Surgeon: Rolm Bookbinder, MD;  Location: [facility];  Service: General;  Laterality: N/A;  . ERCP N/A 09/18/2016   Procedure: ENDOSCOPIC RETROGRADE CHOLANGIOPANCREATOGRAPHY (ERCP);  Surgeon: Doran Stabler, MD;  Location: [facility] ENDOSCOPY;  Service: Endoscopy;  Laterality: N/A;  . FOOT SURGERY     patient reports four foot surgeries  . FOOT SURGERY Left    bunion, hammer toe surgery  . FOOT SURGERY Right    shaved bunion, hammer toe correction  .  KNEE ARTHROSCOPY Right 1997  . REPLACEMENT TOTAL KNEE Left   . 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 RETINAL DETACHMENT SURGERY    . RETINAL DETACHMENT SURGERY Left 2008  . THYROIDECTOMY, PARTIAL Right   . THYROIDECTOMY, PARTIAL  2005    Social History   Tobacco Use  . Smoking status: Former Smoker    Last attempt to quit: 06/02/1979    Years since quitting: 38.9  . Smokeless tobacco: Never Used  Substance Use Topics  . Alcohol use: Yes    Alcohol/week: 1.0 standard drinks    Types: 1 Glasses of wine per week    Comment: Occ  . Drug use: No    Family History  Problem Relation  Age of Onset  . Heart disease Mother   . Heart attack Mother   . Heart disease Father   . Diabetes Maternal Grandmother   . Cancer Paternal Grandfather        stomach    Allergies  Allergen Reactions  . Percocet [Oxycodone-Acetaminophen] Other (See Comments)    Pt. reports that the medication causes her to be light headed and pass out.    Medication list has been reviewed and updated.  Current Outpatient Medications on File Prior to Visit  Medication Sig Dispense Refill  . Calcium Carb-Cholecalciferol (CALCIUM 600 + D) 600-200 MG-UNIT TABS Take 1 tablet by mouth 2 (two) times daily. 30 each 2  . Docusate Sodium (STOOL SOFTENER LAXATIVE PO) Take 1 tablet by mouth daily.    Marland Kitchen LORazepam (ATIVAN) 2 MG tablet Take 1 to 1.5 pills (2 to 3 mg) by mouth at bedtime as needed for sleep 45 tablet 2  . metoprolol succinate (TOPROL-XL) 25 MG 24 hr tablet TAKE 1 TABLET(25 MG) BY MOUTH DAILY 90 tablet 1  . Multiple Vitamins-Minerals (WOMENS DAILY FORMULA) TABS Take 1 tablet by mouth daily.    . naproxen sodium (ALEVE) 220 MG tablet Take 220 mg by mouth 2 (two) times daily.    Marland Kitchen omeprazole (PRILOSEC) 20 MG capsule TAKE 1 CAPSULE(20 MG) BY MOUTH DAILY (Patient taking differently: Take 20 mg by mouth daily. ) 30 capsule 5  . omeprazole (PRILOSEC) 20 MG capsule TAKE 1 CAPSULE BY MOUTH EVERY DAY AS NEEDED 90 capsule 1  . triamterene-hydrochlorothiazide (MAXZIDE-25) 37.5-25 MG tablet TAKE 1 TABLET BY MOUTH EVERY DAY 90 tablet 1  . venlafaxine XR (EFFEXOR-XR) 37.5 MG 24 hr capsule Take 1 capsule (37.5 mg total) by mouth daily with breakfast. (Patient taking differently: Take 75 mg by mouth daily with breakfast. ) 30 capsule 6   No current facility-administered medications on file prior to visit.     Review of Systems:  As per HPI- otherwise negative.   Physical Examination: There were no vitals filed for this visit. There were no vitals filed for this visit. There is no height or weight on file to  calculate BMI. Ideal Body Weight:    GEN: WDWN, NAD, Non-toxic, A & O x 3 HEENT: Atraumatic, Normocephalic. Neck supple. No masses, No LAD. Ears and Nose: No external deformity. CV: RRR, No M/G/R. No JVD. No thrill. No extra heart sounds. PULM: CTA B, no wheezes, crackles, rhonchi. No retractions. No resp. distress. No accessory muscle use. ABD: S, NT, ND, +BS. No rebound. No HSM. EXTR: No c/c/e NEURO Normal gait.  PSYCH: Normally interactive. Conversant. Not depressed or anxious appearing.  Calm demeanor.    Assessment and Plan: ***  Signed Lamar Blinks, MD

## 2018-04-21 ENCOUNTER — Ambulatory Visit (INDEPENDENT_AMBULATORY_CARE_PROVIDER_SITE_OTHER): Payer: Medicare Other | Admitting: Medical

## 2018-04-21 ENCOUNTER — Ambulatory Visit: Payer: Medicare Other | Admitting: Family Medicine

## 2018-04-21 ENCOUNTER — Encounter: Payer: Self-pay | Admitting: Medical

## 2018-04-21 VITALS — BP 114/67 | HR 96 | Temp 98.0°F | Resp 16 | Ht 61.0 in | Wt 164.0 lb

## 2018-04-21 DIAGNOSIS — M25561 Pain in right knee: Secondary | ICD-10-CM

## 2018-04-21 DIAGNOSIS — F329 Major depressive disorder, single episode, unspecified: Secondary | ICD-10-CM

## 2018-04-21 DIAGNOSIS — F32A Depression, unspecified: Secondary | ICD-10-CM

## 2018-04-21 DIAGNOSIS — N390 Urinary tract infection, site not specified: Secondary | ICD-10-CM

## 2018-04-21 DIAGNOSIS — R300 Dysuria: Secondary | ICD-10-CM

## 2018-04-21 LAB — POC URINALSYSI DIPSTICK (AUTOMATED)
Bilirubin, UA: NEGATIVE
Blood, UA: NEGATIVE
Glucose, UA: NEGATIVE
Ketones, UA: NEGATIVE
Nitrite, UA: NEGATIVE
Protein, UA: NEGATIVE
Spec Grav, UA: 1.01 (ref 1.010–1.025)
Urobilinogen, UA: NEGATIVE U/dL — AB
pH, UA: 7 (ref 5.0–8.0)

## 2018-04-21 MED ORDER — PHENAZOPYRIDINE HCL 200 MG PO TABS: 200.0000 mg | ORAL_TABLET | Freq: Three times a day (TID) | ORAL | 0 refills | Status: DC | PRN

## 2018-04-21 MED ORDER — SERTRALINE HCL 25 MG PO TABS: 25.0000 mg | ORAL_TABLET | Freq: Every day | ORAL | 0 refills | Status: DC

## 2018-04-21 MED ORDER — CIPROFLOXACIN HCL 500 MG PO TABS: 500.0000 mg | ORAL_TABLET | Freq: Two times a day (BID) | ORAL | 0 refills | Status: DC

## 2018-04-21 NOTE — Progress Notes (Signed)
Subjective:    Patient ID: Margaret [name], female    DOB: April 29, 1941, 77 y.o.   MRN: [id number]  HPI  Pt in today reporting urinary symptoms x 1 week.  Dysuria- x 1 week.  Frequent urination-yes Hesitancy-no Suprapubic pressure-no  Fever-no chills-no Nausea-no Vomiting-no CVA pain-no History of UTI-when younger got more. Only 2 uti in 3 years. Gross hematuria-no  Pt grandson just passed away one week ago. 9 month ago husband passed. Pt is on ativan for anxiety. Pt tried venlafaxine but made her real shaky. Pt willing to try different med. States was on other type depression med in past with no side effect. She can't remember what that was.    Review of Systems  Constitutional: Negative for chills, fatigue and fever.  Respiratory: Negative for cough, shortness of breath and wheezing.   Cardiovascular: Negative for chest pain and palpitations.  Gastrointestinal: Negative for abdominal distention, abdominal pain and diarrhea.  Genitourinary: Positive for dysuria and frequency. Negative for decreased urine volume, flank pain and vaginal pain.  Musculoskeletal: Positive for arthralgias. Negative for back pain.  Hematological: Negative for adenopathy. Does not bruise/bleed easily.  Psychiatric/Behavioral: Positive for dysphoric mood. Negative for behavioral problems and confusion.    Past Medical History:  Diagnosis Date  . Arthritis   . History of healed stress fracture 2013   bilateral feet  . Hx of phlebitis    reports remote hx of "superfical blood clots" never anticoagulated  . Hypertension   . Osteopenia      Social History   Socioeconomic History  . Marital status: Widowed    Spouse name: Not on file  . Number of children: Not on file  . Years of education: Not on file  . Highest education level: Not on file  Occupational History  . Not on file  Social Needs  . Financial resource strain: Not on file  . Food insecurity:    Worry: Not on file   Inability: Not on file  . Transportation needs:    Medical: Not on file    Non-medical: Not on file  Tobacco Use  . Smoking status: Former Smoker    Last attempt to quit: 06/02/1979    Years since quitting: 38.9  . Smokeless tobacco: Never Used  Substance and Sexual Activity  . Alcohol use: Yes    Alcohol/week: 1.0 standard drinks    Types: 1 Glasses of wine per week    Comment: Occ  . Drug use: No  . Sexual activity: Not Currently  Lifestyle  . Physical activity:    Days per week: Not on file    Minutes per session: Not on file  . Stress: Not on file  Relationships  . Social connections:    Talks on phone: Not on file    Gets together: Not on file    Attends religious service: Not on file    Active member of club or organization: Not on file    Attends meetings of clubs or organizations: Not on file    Relationship status: Not on file  . Intimate partner violence:    Fear of current or ex partner: Not on file    Emotionally abused: Not on file    Physically abused: Not on file    Forced sexual activity: Not on file  Other Topics Concern  . 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 CHOLECYSTECTOMY N/A 09/19/2016   Procedure: LAPAROSCOPIC CHOLECYSTECTOMY POSSIBLE INTRAOPERATIVE CHOLANGIOGRAM;  Surgeon: Rolm Bookbinder, MD;  Location: [facility];  Service: General;  Laterality: N/A;  . ERCP N/A 09/18/2016   Procedure: ENDOSCOPIC RETROGRADE CHOLANGIOPANCREATOGRAPHY (ERCP);  Surgeon: Doran Stabler, MD;  [facility] END[facility]COPY;  Service: Endoscopy;  Laterality: N/A;  . FOOT SURGERY     patient reports four foot surgeries  . FOOT SURGERY Left    bunion, hammer toe surgery  . FOOT SURGERY Right    shaved bunion, hammer toe  correction  . KNEE ARTHROSCOPY Right 1997  . REPLACEMENT TOTAL KNEE Left   . 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 RETINAL DETACHMENT SURGERY    . RETINAL DETACHMENT SURGERY Left 2008  . THYROIDECTOMY, PARTIAL Right   . THYROIDECTOMY, PARTIAL  2005    Family History  Problem Relation Age of Onset  . Heart disease Mother   . Heart attack Mother   . Heart disease Father   . Diabetes Maternal Grandmother   . Cancer Paternal Grandfather        stomach    Allergies  Allergen Reactions  . Percocet [Oxycodone-Acetaminophen] Other (See Comments)    Pt. reports that the medication causes her to be light headed and pass out.    Current Outpatient Medications on File Prior to Visit  Medication Sig Dispense Refill  . Calcium Carb-Cholecalciferol (CALCIUM 600 + D) 600-200 MG-UNIT TABS Take 1 tablet by mouth 2 (two) times daily. 30 each 2  . Docusate Sodium (STOOL SOFTENER LAXATIVE PO) Take 1 tablet by mouth daily.    Marland Kitchen LORazepam (ATIVAN) 2 MG tablet Take 1 to 1.5 pills (2 to 3 mg) by mouth at bedtime as needed for sleep 45 tablet 2  . metoprolol succinate (TOPROL-XL) 25 MG 24 hr tablet TAKE 1 TABLET(25 MG) BY MOUTH DAILY 90 tablet 1  . Multiple Vitamins-Minerals (WOMENS DAILY FORMULA) TABS Take 1 tablet by mouth daily.    . naproxen sodium (ALEVE) 220 MG tablet Take 220 mg by mouth 2 (two) times daily.    Marland Kitchen omeprazole (PRILOSEC) 20 MG capsule TAKE 1 CAPSULE(20 MG) BY MOUTH DAILY (Patient taking differently: Take 20 mg by mouth daily. ) 30 capsule 5  . omeprazole (PRILOSEC) 20 MG capsule TAKE 1 CAPSULE BY MOUTH EVERY DAY AS NEEDED 90 capsule 1  . triamterene-hydrochlorothiazide (MAXZIDE-25) 37.5-25 MG tablet TAKE 1 TABLET BY MOUTH EVERY DAY 90 tablet 1   No current facility-administered medications on file prior to visit.     BP 114/67   Pulse 96   Temp 98 F (36.7 C) (Oral)   Resp 16   Ht 5\' 1"  (1.549 m)   Wt 164 lb (74.4 kg)   SpO2 97%   BMI 30.99 kg/m       Objective:    Physical Exam   General- No acute distress. Pleasant patient. Neck- Full range of motion, no jvd Lungs- Clear, even and unlabored. Heart- regular rate and rhythm. Neurologic- CNII- XII grossly intact.  Back-no cva tenderness. Abdomen-soft, nontender+bs. No rebound or guarding. Faint suprapubic tender Rt knee- pain on flexion and extension.      Assessment & Plan:  You appear to have a urinary tract infection. I am prescribing antibiotic cipro for the probable infection and pyridium for pain.  Hydrate well. I am sending out a urine culture. During the interim if your signs and symptoms worsen rather than improving please notify us. We will notify your when the culture results are back.  For depression, I prescribed low dose sertraline.  For knee pain continue with tylenol. Hard to find good option due to decreased kidney function.  Follow up in 2-3 weeks or as needed.  Mackie Pai, PA-C

## 2018-04-21 NOTE — Patient Instructions (Signed)
You appear to have a urinary tract infection. I am prescribing antibiotic cipro for the probable infection and pyridium for pain. Hydrate well. I am sending out a urine culture. During the interim if your signs and symptoms worsen rather than improving please notify us. We will notify your when the culture results are back.  For depression, I prescribed low dose sertraline.  For knee pain continue with tylenol. Hard to find good option due to decreased kidney function.  Follow up in 2-3 weeks or as needed.

## 2018-04-22 LAB — URINE CULTURE
MICRO NUMBER:: 91405291
SPECIMEN QUALITY: ADEQUATE

## 2018-04-30 NOTE — Progress Notes (Addendum)
Margaret [name] at [facility] [address], Margaret [name], [place] [address] [phone]  Date:  05/02/2018   Name:  Margaret [name]   DOB:  [birth year]   MRN:  [id number]  PCP:  Margaret Mclean, MD    Chief Complaint: Medication Management and UTI follow up   History of Present Illness:  Margaret [name] is a 77 y.o. very pleasant female patient who presents with the following:  Here today to discuss medications Seen by myself in October at which time we were treating her for grief, anxiety and depression, and she was doing ok: Her husband died in 2022/08/26. Last month we put her on effexor for protracted severe grief/ depression. Her mood is better but the med seems to have caused a tremor.  She is concerned about Parkinson's disease.  Encouraged her to stop the effexor- taper over a week- and we will see if the tremor does not go away. I also am not sure if she will then need another med for depression or not- she will update me in 2-3 weeks Refilled her lorazepam- she is taking up to 3mg  at bedtime.  Updated her rx to reflect this but encouraged her to limit use as much as she is able    However she was then in last week and saw Margaret [name]- her grandson had passed away.  He died on 12/11/2024at 76 yo.  This was her son's child, her eldest grandchild.   His death was a big shock Margaret [name] changed her over to sertraline   She is not taking her sertraline - she took it just once but then stopped it.   She is taking lorazepam at bedtime and sometimes one during the day as well since this happened. She needs a refill as she is going through it faster than normal  She takes 2-3 mg at bedtime and is taking 2 mg in the am as well sometimes  She does need her omeprazole as well  Margaret [name] has had a very hard year but fees like she is bearing it ok.  She denies any SI  BP Readings from Last 3 Encounters:  05/02/18 122/82  04/21/18 114/67  03/24/18 120/88    Her BP may be overtreated and her recent renal labs show impairment. Will try decreasing her diuretic   She is having some burning with urination off and on- no blood in her urine, no fever   We also need to check on labs for her today, history of renal insuf  She is using aleve sometimes for her arthritis pain And she may also use some tylneol but not that helpful   Reviewed NCCSR:  04/23/2018  1   09/14/2017  Lyrica 75 Mg Capsule  60.00 30 Margaret [name]  [id number]  [facility] ([id number])  0/2 1.00 LME Comm Ins  Margaret [name]  03/24/2018  1   03/24/2018  Lorazepam 2 Mg Tablet  45.00 30 Margaret [name]  [id number]  [facility] ([id number])  0/2 3.00 LME Comm Ins  Margaret [name]  03/07/2018  1   03/04/2018  Lorazepam 2 Mg Tablet  30.00 30 Margaret [name]  [id number]  [facility] ([id number])  0/2 2.00 LME Private Pay  Margaret [name]  01/03/2018  1   11/24/2017  Lorazepam 2 Mg Tablet  90.00 90 Margaret [name]  [id number]  [facility] ([id number])  1/3 2.00 LME Comm Ins  Margaret [name]  12/10/2017  1   07/26/2017  Lyrica 75 Mg Capsule  60.00 30  Margaret [name]  [id number]  [place] [phone])  0/5       Patient Active Problem List   Diagnosis Date Noted  . Plantar fasciitis of right foot 02/03/2017  . Porokeratosis 02/03/2017  . Acute pancreatitis due to calculus of common bile duct 09/17/2016  . Acute gallstone pancreatitis   . Calculus of gallbladder with acute cholecystitis and obstruction   . LFT elevation   . Stress fracture 01/01/2016  . Insomnia 12/16/2015  . Peripheral neuropathy 10/18/2015  . Renal insufficiency 03/06/2015  . HTN (hypertension) 03/06/2015  . Osteopenia 03/06/2015  . GERD (gastroesophageal reflux disease) 03/06/2015  . Vaginal pessary present 03/06/2015    Past Medical History:  Diagnosis Date  . Arthritis   . History of healed stress fracture 2013   bilateral feet  . Hx of phlebitis    reports remote hx of "superfical blood clots" never anticoagulated  . Hypertension   . Osteopenia     Past Surgical History:  Procedure Laterality Date  . CHOLECYSTECTOMY N/A 09/19/2016   Procedure: LAPAROSCOPIC  CHOLECYSTECTOMY POSSIBLE INTRAOPERATIVE CHOLANGIOGRAM;  Surgeon: Rolm Bookbinder, MD;  Location: [facility];  Service: General;  Laterality: N/A;  . ERCP N/A 09/18/2016   Procedure: ENDOSCOPIC RETROGRADE CHOLANGIOPANCREATOGRAPHY (ERCP);  Surgeon: Doran Stabler, MD;  Location: [facility] END[facility]COPY;  Service: Endoscopy;  Laterality: N/A;  . FOOT SURGERY     patient reports four foot surgeries  . FOOT SURGERY Left    bunion, hammer toe surgery  . FOOT SURGERY Right    shaved bunion, hammer toe correction  . KNEE ARTHROSCOPY Right 1997  . REPLACEMENT TOTAL KNEE Left   . 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 RETINAL DETACHMENT SURGERY    . RETINAL DETACHMENT SURGERY Left 2008  . THYROIDECTOMY, PARTIAL Right   . THYROIDECTOMY, PARTIAL  2005    Social History   Tobacco Use  . Smoking status: Former Smoker    Last attempt to quit: 06/02/1979    Years since quitting: 38.9  . Smokeless tobacco: Never Used  Substance Use Topics  . Alcohol use: Yes    Alcohol/week: 1.0 standard drinks    Types: 1 Glasses of wine per week    Comment: Occ  . Drug use: No    Family History  Problem Relation Age of Onset  . Heart disease Mother   . Heart attack Mother   . Heart disease Father   . Diabetes Maternal Grandmother   . Cancer Paternal Grandfather        stomach    Allergies  Allergen Reactions  . Percocet [Oxycodone-Acetaminophen] Other (See Comments)    Pt. reports that the medication causes her to be light headed and pass out.    Medication list has been reviewed and updated.  Current Outpatient Medications on File Prior to Visit  Medication Sig Dispense Refill  . Docusate Sodium (STOOL SOFTENER LAXATIVE PO) Take 1 tablet by mouth daily.    Marland Kitchen LORazepam (ATIVAN) 2 MG tablet Take 1 to 1.5 pills (2 to 3 mg) by mouth at bedtime as needed for sleep 45 tablet 2  . metoprolol succinate (TOPROL-XL) 25 MG 24 hr tablet TAKE 1 TABLET(25 MG) BY MOUTH DAILY 90 tablet 1  . Multiple Vitamins-Minerals  (WOMENS DAILY FORMULA) TABS Take 1 tablet by mouth daily.    Marland Kitchen omeprazole (PRILOSEC) 20 MG capsule TAKE 1 CAPSULE BY MOUTH EVERY DAY AS NEEDED 90 capsule 1  . triamterene-hydrochlorothiazide (MAXZIDE-25) 37.5-25 MG tablet TAKE 1 TABLET BY MOUTH EVERY  DAY 90 tablet 1  . sertraline (ZOLOFT) 25 MG tablet Take 1 tablet (25 mg total) by mouth at bedtime. (Patient not taking: Reported on 05/02/2018) 30 tablet 0   No current facility-administered medications on file prior to visit.     Review of Systems:  As per HPI- otherwise negative. No fever or chills Feels well physically   Physical Examination: Vitals:   05/02/18 1440  BP: 122/82  Pulse: 72  Resp: 16  Temp: 98.1 F (36.7 C)  SpO2: 100%   Vitals:   05/02/18 1440  Weight: 167 lb (75.8 kg)  Height: 5\' 1"  (1.549 m)   Body mass index is 31.55 kg/m. Ideal Body Weight: Weight in (lb) to have BMI = 25: 132  GEN: WDWN, NAD, Non-toxic, A & O x 3, obese, looks well Nicely dressed and groomed as per her usual  HEENT: Atraumatic, Normocephalic. Neck supple. No masses, No LAD. Ears and Nose: No external deformity. CV: RRR, No M/G/R. No JVD. No thrill. No extra heart sounds. PULM: CTA B, no wheezes, crackles, rhonchi. No retractions. No resp. distress. No accessory muscle use. EXTR: No c/c/e NEURO Normal gait.  PSYCH: Normally interactive. Conversant. Not depressed or anxious appearing.  Calm demeanor.    Assessment and Plan: Renal insufficiency - Plan: CBC, Comprehensive metabolic panel  Urinary frequency - Plan: Urine Culture  Gastroesophageal reflux disease, esophagitis presence not specified - Plan: omeprazole (PRILOSEC) 20 MG capsule  Adjustment insomnia - Plan: LORazepam (ATIVAN) 2 MG tablet  Grief reaction with prolonged bereavement  Following up today Her kidney function has not been as good lately.  Her BP is on the low side as well Halve her diuretic dose; may be able to DC entirely, we will see May be able to give  her celebrex for her OA pain depending on renal function Refilled lorazepam. Encouraged her to use this as little as she can but will give her more per month in case she needs a daytime dose   Offered support and my sympathy on the tragic loss of her grandson Asked her to see me in 3 weeks for a recheck.  alternatively she can email me with some BP readings if she does not feel that an OV is needed  Meds ordered this encounter  Medications  . omeprazole (PRILOSEC) 20 MG capsule    Sig: TAKE 1 CAPSULE BY MOUTH EVERY DAY AS NEEDED    Dispense:  90 capsule    Refill:  3  . LORazepam (ATIVAN) 2 MG tablet    Sig: Take 1 to 1.5 pills (2 to 3 mg) by mouth at bedtime as needed for sleep. May also take 1 pill during the day if needed    Dispense:  70 tablet    Refill:  1    Signed Lamar Blinks, MD  Received her labs Results for orders placed or performed in visit on 05/02/18  Urine Culture  Result Value Ref Range   MICRO NUMBER: [id number]    SPECIMEN QUALITY: Adequate    Sample Source URINE    STATUS: FINAL    Result:      Single organism less than 10,000 CFU/mL isolated. These organisms, commonly found on external and internal genitalia, are considered colonizers. No further testing performed.  CBC  Result Value Ref Range   WBC 13.6 (H) 4.0 - 10.5 K/uL   RBC 4.24 3.87 - 5.11 Mil/uL   Platelets 231.0 150.0 - 400.0 K/uL   Hemoglobin 13.5 12.0 - 15.0 g/dL  HCT 40.3 36.0 - 46.0 %   MCV 95.1 78.0 - 100.0 fl   MCHC 33.5 30.0 - 36.0 g/dL   RDW 14.6 11.5 - 15.5 %  Comprehensive metabolic panel  Result Value Ref Range   Sodium 139 135 - 145 mEq/L   Potassium 3.3 (L) 3.5 - 5.1 mEq/L   Chloride 98 96 - 112 mEq/L   CO2 32 19 - 32 mEq/L   Glucose, Bld 138 (H) 70 - 99 mg/dL   BUN 24 (H) 6 - 23 mg/dL   Creatinine, Ser 1.38 (H) 0.40 - 1.20 mg/dL   Total Bilirubin 0.5 0.2 - 1.2 mg/dL   Alkaline Phosphatase 67 39 - 117 U/L   AST 21 0 - 37 U/L   ALT 14 0 - 35 U/L   Total Protein 6.9 6.0  - 8.3 g/dL   Albumin 4.3 3.5 - 5.2 g/dL   Calcium 10.3 8.4 - 10.5 mg/dL   GFR 39.33 (L) >60.00 mL/min   Message to pt

## 2018-05-02 ENCOUNTER — Encounter: Payer: Self-pay | Admitting: Family Medicine

## 2018-05-02 ENCOUNTER — Ambulatory Visit (INDEPENDENT_AMBULATORY_CARE_PROVIDER_SITE_OTHER): Payer: Medicare Other | Admitting: Family Medicine

## 2018-05-02 VITALS — BP 122/82 | HR 72 | Temp 98.1°F | Resp 16 | Ht 61.0 in | Wt 167.0 lb

## 2018-05-02 DIAGNOSIS — F5102 Adjustment insomnia: Secondary | ICD-10-CM | POA: Diagnosis not present

## 2018-05-02 DIAGNOSIS — N289 Disorder of kidney and ureter, unspecified: Secondary | ICD-10-CM | POA: Diagnosis not present

## 2018-05-02 DIAGNOSIS — F4321 Adjustment disorder with depressed mood: Secondary | ICD-10-CM | POA: Diagnosis not present

## 2018-05-02 DIAGNOSIS — F4329 Adjustment disorder with other symptoms: Secondary | ICD-10-CM

## 2018-05-02 DIAGNOSIS — R350 Frequency of micturition: Secondary | ICD-10-CM

## 2018-05-02 DIAGNOSIS — K219 Gastro-esophageal reflux disease without esophagitis: Secondary | ICD-10-CM | POA: Diagnosis not present

## 2018-05-02 DIAGNOSIS — F4381 Prolonged grief disorder: Secondary | ICD-10-CM

## 2018-05-02 MED ORDER — OMEPRAZOLE 20 MG PO CPDR: DELAYED_RELEASE_CAPSULE | ORAL | 3 refills | Status: DC

## 2018-05-02 MED ORDER — LORAZEPAM 2 MG PO TABS: ORAL_TABLET | ORAL | 1 refills | Status: DC

## 2018-05-02 NOTE — Patient Instructions (Addendum)
Let's decease your fluid pill- this may help with your kidney function and I think your BP will do ok. Please cut down to a 1/2 pill of the triamterene/hctz   I will be in touch with your labs asap. If your kidney function is adequate I will call in some celebrex to try for your arthritis  We will check your urine culture for any infection  I gave you enough lorazepam that you can take 1.5 pills in the evening and one during the day if needed  Take care!  Please see me in about 3 weeks so we can check on how you are doing

## 2018-05-03 LAB — COMPREHENSIVE METABOLIC PANEL
ALBUMIN: 4.3 g/dL (ref 3.5–5.2)
ALT: 14 U/L (ref 0–35)
AST: 21 U/L (ref 0–37)
Alkaline Phosphatase: 67 U/L (ref 39–117)
BUN: 24 mg/dL — ABNORMAL HIGH (ref 6–23)
CALCIUM: 10.3 mg/dL (ref 8.4–10.5)
CHLORIDE: 98 meq/L (ref 96–112)
CO2: 32 meq/L (ref 19–32)
Creatinine, Ser: 1.38 mg/dL — ABNORMAL HIGH (ref 0.40–1.20)
GFR: 39.33 mL/min — AB (ref >60.00)
Glucose, Bld: 138 mg/dL — ABNORMAL HIGH (ref 70–99)
POTASSIUM: 3.3 meq/L — AB (ref 3.5–5.1)
Sodium: 139 mEq/L (ref 135–145)
Total Bilirubin: 0.5 mg/dL (ref 0.2–1.2)
Total Protein: 6.9 g/dL (ref 6.0–8.3)

## 2018-05-03 LAB — CBC
HEMATOCRIT: 40.3 % (ref 36.0–46.0)
HEMOGLOBIN: 13.5 g/dL (ref 12.0–15.0)
MCHC: 33.5 g/dL (ref 30.0–36.0)
MCV: 95.1 fl (ref 78.0–100.0)
PLATELETS: 231 10*3/uL (ref 150.0–400.0)
RBC: 4.24 Mil/uL (ref 3.87–5.11)
RDW: 14.6 % (ref 11.5–15.5)
WBC: 13.6 10*3/uL — ABNORMAL HIGH (ref 4.0–10.5)

## 2018-05-03 LAB — URINE CULTURE
MICRO NUMBER: 91439664
SPECIMEN QUALITY: ADEQUATE

## 2018-05-04 ENCOUNTER — Encounter: Payer: Self-pay | Admitting: Family Medicine

## 2018-05-05 ENCOUNTER — Encounter: Payer: Self-pay | Admitting: Podiatry

## 2018-05-05 ENCOUNTER — Ambulatory Visit (INDEPENDENT_AMBULATORY_CARE_PROVIDER_SITE_OTHER): Payer: Medicare Other | Admitting: Podiatry

## 2018-05-05 ENCOUNTER — Ambulatory Visit (INDEPENDENT_AMBULATORY_CARE_PROVIDER_SITE_OTHER): Payer: Medicare Other

## 2018-05-05 DIAGNOSIS — Q828 Other specified congenital malformations of skin: Secondary | ICD-10-CM | POA: Diagnosis not present

## 2018-05-05 DIAGNOSIS — G629 Polyneuropathy, unspecified: Secondary | ICD-10-CM | POA: Diagnosis not present

## 2018-05-05 DIAGNOSIS — M2011 Hallux valgus (acquired), right foot: Secondary | ICD-10-CM

## 2018-05-06 ENCOUNTER — Telehealth: Payer: Self-pay

## 2018-05-06 MED ORDER — CELECOXIB 100 MG PO CAPS: 100.0000 mg | ORAL_CAPSULE | Freq: Two times a day (BID) | ORAL | 3 refills | Status: DC | PRN

## 2018-05-06 NOTE — Telephone Encounter (Signed)
Copied from [place] [id number]. Topic: Quick Communication - Rx Refill/Question >> May 06, 2018  9:31 AM Margot Ables wrote: Medication: pt called stating she received mychart msg and would like RX for celebrex that Dr. [name] offered.   Preferred Pharmacy (with phone number or street name): [id number] - [address], [place] AT [facility] [address] [place] [phone] (Phone) [phone] (Fax)

## 2018-05-06 NOTE — Telephone Encounter (Signed)
Ok- sent in rx for her. Called  Let her know I sent in Celebrex prescription.  Advised that as her kidneys are borderline, though she can take this medication twice a day I would suggest she take it only once a day if possible.  She states understanding.

## 2018-05-12 NOTE — Progress Notes (Signed)
Subjective: Margaret [name] presents the office today for concerns of painful calluses to both of her feet.  She says the calluses been causing a lot of pain.  She is also interested in having surgery on the bunion on the right side.  She states that she just wants that "shaved down" as is causing a lot of pain.  She wants to make sure that she has surgery she can walk on it afterwards and improved.  Also states that the injection did help some.  Denies any recent injury or fall since I last saw her she has no other concerns. No acute changes since last appointment, and no other complaints at this time.   Objective: AAO x3, NAD DP/PT pulses palpable bilaterally, CRT less than 3 seconds Prominent metatarsal heads plantarly with atrophy of the fat pad. 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  The hallux is overlapping second toe. Hyperkeratotic lesions to the right foot submetatarsal area as well as the arch is well as the left foot submetatarsal 1. There is no underlying ulceration, drainage or any signs of infection.  Tenderness palpation of the fifth metatarsal base proximally along the insertion of the peroneal tendon.  Peroneal tendon appears to be intact.  There is no edema, erythema.  No pain to vibratory sensation. No open lesions or pre-ulcerative lesions.  No pain with calf compression, swelling, warmth, erythema  Assessment: 77 year old female symptomatic hyperkeratotic lesions; tendinitis  Plan: -All treatment options discussed with the patient including all alternatives, risks, complications.  -X-rays obtained reviewed.  Severe bunion deformity present both arthritic changes present. -Has had she wants to consider surgery.  I will contact her primary care doctor to see about clearance for surgery for her.  Discussed with her Jake Michaelis bunionectomy under moderate sedation with local.  She would not want to do this until at least January at this point.  We  discussed the procedure as well as postoperative course and she is in agreement that she had to have this done. -Hyperkeratotic lesion sharply.  X3 without any complications or bleeding.  Trula Slade DPM

## 2018-05-17 ENCOUNTER — Telehealth: Payer: Self-pay

## 2018-05-17 NOTE — Telephone Encounter (Signed)
Copied from [place] [id number]. Topic: General - Other >> May 17, 2018  2:43 PM Virl Axe D wrote: Reason for CRM: Pt called to let Dr. [name] know that she is doing good on the Celebrex.

## 2018-05-17 NOTE — Telephone Encounter (Signed)
Great, noted

## 2018-05-18 ENCOUNTER — Other Ambulatory Visit: Payer: Self-pay | Admitting: Medical

## 2018-06-06 ENCOUNTER — Ambulatory Visit (INDEPENDENT_AMBULATORY_CARE_PROVIDER_SITE_OTHER): Payer: Medicare Other | Admitting: Podiatry

## 2018-06-06 ENCOUNTER — Encounter: Payer: Self-pay | Admitting: Podiatry

## 2018-06-06 DIAGNOSIS — G629 Polyneuropathy, unspecified: Secondary | ICD-10-CM | POA: Diagnosis not present

## 2018-06-06 DIAGNOSIS — M2011 Hallux valgus (acquired), right foot: Secondary | ICD-10-CM

## 2018-06-06 DIAGNOSIS — Q828 Other specified congenital malformations of skin: Secondary | ICD-10-CM | POA: Diagnosis not present

## 2018-06-13 NOTE — Progress Notes (Signed)
Subjective: Margaret [name] presents the office today for concerns of painful calluses to both of her feet.  She also states that the bunion still been hurting quite a bit and she wants to proceed with surgery to fix the bunion.  She states that this is been ongoing issue for several years and she does have a long-term history of having bunionectomy which they just shaved down.  It did well for some time but started to come back and because she is having pain on a daily basis despite offloading, padding, shoe modifications she was to proceed with surgery. Denies any recent injury or fall since I last saw her she has no other concerns. No acute changes since last appointment, and no other complaints at this time.   Objective: AAO x3, NAD DP/PT pulses palpable bilaterally, CRT less than 3 seconds Prominent metatarsal heads plantarly with atrophy of the fat pad. 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  The hallux is overlapping second toe. Hyperkeratotic lesions to the right foot submetatarsal area as well as the arch is well as the left foot submetatarsal 1. There is no underlying ulceration, drainage or any signs of infection.  Today there is no significant tenderness to palpation of the fifth metatarsal base proximally along the insertion of the peroneal tendon.  Peroneal tendon appears to be intact.  There is no edema, erythema.  No pain to vibratory sensation. No open lesions or pre-ulcerative lesions.  No pain with calf compression, swelling, warmth, erythema  Assessment: 78 year old female symptomatic hyperkeratotic lesions; bunion  Plan: -All treatment options discussed with the patient including all alternatives, risks, complications.  -She wants to consider bunion surgery at this time.  We discussed the [employer].  She is pending medical clearance unfortunately she is likely to need a stress test.  She is to follow-up with her primary care physician  for this.  We discussed the surgery as well as the postoperative course.  We discussed alternatives, risks, complications.  Once we get medical clearance we will get her scheduled. -Hyperkeratotic lesion sharply.  X3 without any complications or bleeding.  Trula Slade DPM

## 2018-06-28 ENCOUNTER — Ambulatory Visit (INDEPENDENT_AMBULATORY_CARE_PROVIDER_SITE_OTHER): Payer: Medicare Other | Admitting: Podiatry

## 2018-06-28 ENCOUNTER — Encounter: Payer: Self-pay | Admitting: Podiatry

## 2018-06-28 DIAGNOSIS — L989 Disorder of the skin and subcutaneous tissue, unspecified: Secondary | ICD-10-CM

## 2018-06-28 DIAGNOSIS — M79674 Pain in right toe(s): Secondary | ICD-10-CM

## 2018-06-28 DIAGNOSIS — M79675 Pain in left toe(s): Secondary | ICD-10-CM

## 2018-06-28 DIAGNOSIS — M2011 Hallux valgus (acquired), right foot: Secondary | ICD-10-CM

## 2018-06-28 DIAGNOSIS — Q828 Other specified congenital malformations of skin: Secondary | ICD-10-CM

## 2018-06-28 DIAGNOSIS — B351 Tinea unguium: Secondary | ICD-10-CM | POA: Diagnosis not present

## 2018-06-28 NOTE — Patient Instructions (Signed)
Pre-Operative Instructions  Congratulations, you have decided to take an important step towards improving your quality of life.  You can be assured that the doctors and staff at [facility] will be with you every step of the way.  Here are some important things you should know:  1. Plan to be at the surgery center/hospital at least 1 (one) hour prior to your scheduled time, unless otherwise directed by the surgical center/hospital staff.  You must have a responsible adult accompany you, remain during the surgery and drive you home.  Make sure you have directions to the surgical center/hospital to ensure you arrive on time. 2. If you are having surgery at [facility] or [facility], you will need a copy of your medical history and physical form from your family physician within one month prior to the date of surgery. We will give you a form for your primary physician to complete.  3. We make every effort to accommodate the date you request for surgery.  However, there are times where surgery dates or times have to be moved.  We will contact you as soon as possible if a change in schedule is required.   4. No aspirin/ibuprofen for one week before surgery.  If you are on aspirin, any non-steroidal anti-inflammatory medications (Mobic, Aleve, Ibuprofen) should not be taken seven (7) days prior to your surgery.  You make take Tylenol for pain prior to surgery.  5. Medications - If you are taking daily heart and blood pressure medications, seizure, reflux, allergy, asthma, anxiety, pain or diabetes medications, make sure you notify the surgery center/hospital before the day of surgery so they can tell you which medications you should take or avoid the day of surgery. 6. No food or drink after midnight the night before surgery unless directed otherwise by surgical center/hospital staff. 7. No alcoholic beverages 24-hours prior to surgery.  No smoking 24-hours prior or 24-hours after  surgery. 8. Wear loose pants or shorts. They should be loose enough to fit over bandages, boots, and casts. 9. Don't wear slip-on shoes. Sneakers are preferred. 10. Bring your boot with you to the surgery center/hospital.  Also bring crutches or a walker if your physician has prescribed it for you.  If you do not have this equipment, it will be provided for you after surgery. 11. If you have not been contacted by the surgery center/hospital by the day before your surgery, call to confirm the date and time of your surgery. 12. Leave-time from work may vary depending on the type of surgery you have.  Appropriate arrangements should be made prior to surgery with your employer. 13. Prescriptions will be provided immediately following surgery by your doctor.  Fill these as soon as possible after surgery and take the medication as directed. Pain medications will not be refilled on weekends and must be approved by the doctor. 14. Remove nail polish on the operative foot and avoid getting pedicures prior to surgery. 15. Wash the night before surgery.  The night before surgery wash the foot and leg well with water and the antibacterial soap provided. Be sure to pay special attention to beneath the toenails and in between the toes.  Wash for at least three (3) minutes. Rinse thoroughly with water and dry well with a towel.  Perform this wash unless told not to do so by your physician.  Enclosed: 1 Ice pack (please put in freezer the night before surgery)   1 Hibiclens skin cleaner     Pre-op instructions  If you have any questions regarding the instructions, please do not hesitate to call our office.  [place]: [address], [place], [place] [address] -- [phone]  [place]: [address] [place], [place] [address] -- [phone]  [place]: [address]  [place], [place] [address] -- [phone]  [place]: [address], [address], [place], [place] [address] -- [phone]  Website:  [email or web address] 

## 2018-06-29 ENCOUNTER — Telehealth: Payer: Self-pay | Admitting: *Deleted

## 2018-06-29 DIAGNOSIS — R0989 Other specified symptoms and signs involving the circulatory and respiratory systems: Secondary | ICD-10-CM

## 2018-06-29 NOTE — Progress Notes (Signed)
Subjective: 78 year old female presents the office today as she was to further discuss surgery.  She states that she was to have surgery for the bunion procedure once the bunion just shaved down.  She had this done previously did well for some time but did come back.  She states that she cannot wear shoes because of pain she is having to wear surgical shoe.  We have told her this on several occasions at this point she was going proceed with surgery.  She also presents today due to a painful callus as well as toenail that should have trimmed as they are causing irritation with pressure and shoes. Denies any systemic complaints such as fevers, chills, nausea, vomiting. No acute changes since last appointment, and no other complaints at this time.   Objective: AAO x3, NAD DP/PT pulses palpable bilaterally, CRT less than 3 seconds Hyperpigmented soft tissue lesion present anterior aspect the left ankle measured approximately 1.2 x 0.9 cm.  This is been removed previously but she states it come back larger.  She wants to have this area biopsied and removed. Bony deformities present on the right foot there is tenderness palpation directly on the first metatarsal head medially along the bunion deformity.  There is significant bunion as well as hammertoe contractures present.  Hyperkeratotic lesions present the right foot submetatarsal as well as the arch of the foot as well as left foot submetatarsal 1.  Upon debridement there is no underlying ulceration, drainage or any signs of infection.  The nails are hypertrophic, dystrophic, discolored, elongated x10.  There is tenderness nails 1-5 bilaterally.  No surrounding redness or drainage or any signs of infection. No open lesions or pre-ulcerative lesions.  No pain with calf compression, swelling, warmth, erythema  Assessment: Symptomatic bunion deformity right foot and skin lesion left ankle; symptomatic onychomycosis/hyperkeratotic lesions  Plan: -All  treatment options discussed with the patient including all alternatives, risks, complications.  -Reviewed the x-rays with her.  We discussed both surgical as well as conservative treatment.  Of time she wants to proceed with surgery.  After discussion she wants to only have that bump shaved down.  Discussed with her this may not provide long-term results.  She understands this but she wants to proceed.  Also soft tissue mass excision/biopsy left ankle lesion. -Discussed the shape bunionectomy on the right foot.  She will be in a surgical shoe afterwards.  Also we will try this under sedation, local. Will obtain medical clearance prior to surgery.  -The incision placement as well as the postoperative course was discussed with the patient. I discussed risks of the surgery which include, but not limited to, infection, bleeding, pain, swelling, need for further surgery, delayed or nonhealing, painful or ugly scar, numbness or sensation changes, over/under correction, recurrence, transfer lesions, further deformity, hardware failure, DVT/PE, loss of toe/foot. Patient understands these risks and wishes to proceed with surgery. The surgical consent was reviewed with the patient all 3 pages were signed. No promises or guarantees were given to the outcome of the procedure. All questions were answered to the best of my ability. Before the surgery the patient was encouraged to call the office if there is any further questions. The surgery will be performed at the [facility] on an outpatient basis.  -Patient encouraged to call the office with any questions, concerns, change in symptoms.

## 2018-06-29 NOTE — Telephone Encounter (Signed)
"  I'm scheduled to have surgery on February 19 with Dr. [name].  I'd like to change that date.  I'd like to do it in March."  What date would you like?  "I'd like to do it on March 18."  Dr. [name] cannot do it that date.  He can do it the week before or the week after that date.  "Let's do it on March 11."  I'll get it scheduled.  "Thank you so much."  You do need to get your circulation checked prior to the surgery date.  Someone from [employer] will give you a call to schedule that appointment.

## 2018-06-30 ENCOUNTER — Ambulatory Visit: Payer: Medicare Other | Admitting: Podiatry

## 2018-07-01 ENCOUNTER — Other Ambulatory Visit: Payer: Self-pay | Admitting: Podiatry

## 2018-07-01 DIAGNOSIS — R0989 Other specified symptoms and signs involving the circulatory and respiratory systems: Secondary | ICD-10-CM

## 2018-07-02 NOTE — Progress Notes (Addendum)
[facility] [address] [place], [place] [address] [phone]  Date:  07/04/2018   Name:  Margaret [name]   DOB:  04/08/1941   MRN:  [id number]  PCP:  Darreld Mclean, MD    Chief Complaint: Reather Laurence Reaction (2 month follow up)   History of Present Illness:  Margaret [name] is a 78 y.o. very pleasant female patient who presents with the following:  Here today for a periodic recheck  I last saw her about 2 months ago History of HTN, renal insuf, osteopenia, peripheral neuropathy Her husband [name] died about one year ago Then just in 05-23-23 her grandson died as well at age 18  We refilled her lorazepam, and I decreased her triamterene/hctz to a 1/2 pill.  From last visit: She is taking lorazepam at bedtime and sometimes one during the day as well since this happened. She needs a refill as she is going through it faster than normal  She takes 2-3 mg at bedtime and is taking 2 mg in the am as well sometimes /////////////////////////// Let's decease your fluid pill- this may help with your kidney function and I think your BP will do ok. Please cut down to a 1/2 pill of the triamterene/hctz   We hoped that her renal function would improve with decreased diuretic use Check BMP today  She drove here on her own today for the first time It went ok-admits that she was nervous, but she made it.  Overall her driving competence is increasing, but she usually has a family number with her  She has not noted any swelling of her feet or legs with dropping her fluid pill dose Her husband died on 08-12-2022 last year.  She plans to mark the occasion with family She feels like her current dose of Lorazepam is working fine.  She had [facility]rix #1 at [facility], will have her 2nd dose coming up  Done at the [facility] at [address]   She also needs a tetanus booster, but does not have any wound.  I advised her to ask the pharmacist  for this immunization as well  BP Readings from Last 3 Encounters:  07/04/18 126/80  05/02/18 122/82  04/21/18 114/67   Wt Readings from Last 3 Encounters:  07/04/18 169 lb (76.7 kg)  05/02/18 167 lb (75.8 kg)  04/21/18 164 lb (74.4 kg)     Patient Active Problem List   Diagnosis Date Noted  . Plantar fasciitis of right foot 02/03/2017  . Porokeratosis 02/03/2017  . Acute pancreatitis due to calculus of common bile duct 09/17/2016  . Acute gallstone pancreatitis   . Calculus of gallbladder with acute cholecystitis and obstruction   . LFT elevation   . Stress fracture 01/01/2016  . Insomnia 12/16/2015  . Peripheral neuropathy 10/18/2015  . Renal insufficiency 03/06/2015  . HTN (hypertension) 03/06/2015  . Osteopenia 03/06/2015  . GERD (gastroesophageal reflux disease) 03/06/2015  . Vaginal pessary present 03/06/2015    Past Medical History:  Diagnosis Date  . Arthritis   . History of healed stress fracture 2013   bilateral feet  . Hx of phlebitis    reports remote hx of "superfical blood clots" never anticoagulated  . Hypertension   . Osteopenia     Past Surgical History:  Procedure Laterality Date  . CHOLECYSTECTOMY N/A 09/19/2016   Procedure: LAPAROSCOPIC CHOLECYSTECTOMY POSSIBLE INTRAOPERATIVE CHOLANGIOGRAM;  Surgeon: Rolm Bookbinder, MD;  Location: [place];  Service: General;  Laterality: N/A;  . ERCP N/A 09/18/2016   Procedure: ENDOSCOPIC RETROGRADE CHOLANGIOPANCREATOGRAPHY (ERCP);  Surgeon: Doran Stabler, MD;  Location: [facility] ENDOSCOPY;  Service: Endoscopy;  Laterality: N/A;  . FOOT SURGERY     patient reports four foot surgeries  . FOOT SURGERY Left    bunion, hammer toe surgery  . FOOT SURGERY Right    shaved bunion, hammer toe correction  . KNEE ARTHROSCOPY Right 1997  . REPLACEMENT TOTAL KNEE Left   . 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 RETINAL DETACHMENT SURGERY    . RETINAL DETACHMENT SURGERY Left 2008  . THYROIDECTOMY, PARTIAL Right   .  THYROIDECTOMY, PARTIAL  2005    Social History   Tobacco Use  . Smoking status: Former Smoker    Last attempt to quit: 06/02/1979    Years since quitting: 39.1  . Smokeless tobacco: Never Used  Substance Use Topics  . Alcohol use: Yes    Alcohol/week: 1.0 standard drinks    Types: 1 Glasses of wine per week    Comment: Occ  . Drug use: No    Family History  Problem Relation Age of Onset  . Heart disease Mother   . Heart attack Mother   . Heart disease Father   . Diabetes Maternal Grandmother   . Cancer Paternal Grandfather        stomach    Allergies  Allergen Reactions  . Percocet [Oxycodone-Acetaminophen] Other (See Comments)    Pt. reports that the medication causes her to be light headed and pass out.    Medication list has been reviewed and updated.  Current Outpatient Medications on File Prior to Visit  Medication Sig Dispense Refill  . celecoxib (CELEBREX) 100 MG capsule Take 1 capsule (100 mg total) by mouth 2 (two) times daily as needed. 60 capsule 3  . Docusate Sodium (STOOL SOFTENER LAXATIVE PO) Take 1 tablet by mouth daily.    Marland Kitchen LORazepam (ATIVAN) 2 MG tablet Take 1 to 1.5 pills (2 to 3 mg) by mouth at bedtime as needed for sleep. May also take 1 pill during the day if needed 70 tablet 1  . metoprolol succinate (TOPROL-XL) 25 MG 24 hr tablet TAKE 1 TABLET(25 MG) BY MOUTH DAILY 90 tablet 1  . Multiple Vitamins-Minerals (WOMENS DAILY FORMULA) TABS Take 1 tablet by mouth daily.    Marland Kitchen omeprazole (PRILOSEC) 20 MG capsule TAKE 1 CAPSULE BY MOUTH EVERY DAY AS NEEDED 90 capsule 3  . pregabalin (LYRICA) 75 MG capsule Take 75 mg by mouth 2 (two) times daily.    Marland Kitchen triamterene-hydrochlorothiazide (MAXZIDE-25) 37.5-25 MG tablet TAKE 1 TABLET BY MOUTH EVERY DAY 90 tablet 1   No current facility-administered medications on file prior to visit.     Review of Systems:  As per HPI- otherwise negative.  No fever or chills, no chest pain or shortness Physical  Examination: Vitals:   07/04/18 1042  BP: 126/80  Pulse: 73  Resp: 16  Temp: 97.6 F (36.4 C)  SpO2: 98%   Vitals:   07/04/18 1042  Weight: 169 lb (76.7 kg)  Height: 5\' 1"  (1.549 m)   Body mass index is 31.93 kg/m. Ideal Body Weight: Weight in (lb) to have BMI = 25: 132  GEN: WDWN, NAD, Non-toxic, A & O x 3, overweight, looks well  HEENT: Atraumatic, Normocephalic. Neck supple. No masses, No LAD.   Ears and Nose: No external deformity. CV: RRR, No M/G/R. No JVD. No thrill. No  extra heart sounds. PULM: CTA B, no wheezes, crackles, rhonchi. No retractions. No resp. distress. No accessory muscle use. EXTR: No c/c/e NEURO Normal gait.  PSYCH: Normally interactive. Conversant. Not depressed or anxious appearing.  Calm demeanor.    Assessment and Plan: Renal insufficiency - Plan: Basic metabolic panel  Essential hypertension  Grief reaction with prolonged bereavement  Immunization due  Following up today.  We had decreased her Maxide dose by half at last as her blood pressure was on the low side and her renal function was decreasing.  She does, will check her kidneys today.  We will be in touch with her results-if  kidneys are not looking much better, we may stop her fluid pill entirely, may also need to consider nephrology  Discussed her grieving process, she feels like she is doing well.  She is driving more independently now which is going okay  She had the first of 2 Shingrix vaccines, will go back for the second at her pharmacy.  Also advised her to have a tetanus booster  Signed Lamar Blinks, MD  Received her labs-gave her a call.  Her renal function is improved on a lower dose of diuretic.  We will try changing her from [facility] to plain HCTZ 12.5.  Since prescription to her drugstore.  She has a blood pressure cuff, and will monitor her blood pressure at home.  We will also need to keep an eye on her potassium.  She will send a message with some blood pressure  readings in a couple of weeks Results for orders placed or performed in visit on 32/76/14  Basic metabolic panel  Result Value Ref Range   Sodium 144 135 - 145 mEq/L   Potassium 3.8 3.5 - 5.1 mEq/L   Chloride 104 96 - 112 mEq/L   CO2 32 19 - 32 mEq/L   Glucose, Bld 94 70 - 99 mg/dL   BUN 29 (H) 6 - 23 mg/dL   Creatinine, Ser 1.15 0.40 - 1.20 mg/dL   Calcium 9.6 8.4 - 10.5 mg/dL   GFR 45.65 (L) >60.00 mL/min

## 2018-07-04 ENCOUNTER — Ambulatory Visit (INDEPENDENT_AMBULATORY_CARE_PROVIDER_SITE_OTHER): Payer: Medicare Other | Admitting: Family Medicine

## 2018-07-04 ENCOUNTER — Encounter: Payer: Self-pay | Admitting: Family Medicine

## 2018-07-04 VITALS — BP 126/80 | HR 73 | Temp 97.6°F | Resp 16 | Ht 61.0 in | Wt 169.0 lb

## 2018-07-04 DIAGNOSIS — F4321 Adjustment disorder with depressed mood: Secondary | ICD-10-CM

## 2018-07-04 DIAGNOSIS — I10 Essential (primary) hypertension: Secondary | ICD-10-CM | POA: Diagnosis not present

## 2018-07-04 DIAGNOSIS — N289 Disorder of kidney and ureter, unspecified: Secondary | ICD-10-CM | POA: Diagnosis not present

## 2018-07-04 DIAGNOSIS — Z23 Encounter for immunization: Secondary | ICD-10-CM | POA: Diagnosis not present

## 2018-07-04 DIAGNOSIS — F4329 Adjustment disorder with other symptoms: Secondary | ICD-10-CM

## 2018-07-04 DIAGNOSIS — F4381 Prolonged grief disorder: Secondary | ICD-10-CM

## 2018-07-04 LAB — BASIC METABOLIC PANEL
BUN: 29 mg/dL — ABNORMAL HIGH (ref 6–23)
CALCIUM: 9.6 mg/dL (ref 8.4–10.5)
CO2: 32 mEq/L (ref 19–32)
Chloride: 104 mEq/L (ref 96–112)
Creatinine, Ser: 1.15 mg/dL (ref 0.40–1.20)
GFR: 45.65 mL/min — ABNORMAL LOW (ref >60.00)
GLUCOSE: 94 mg/dL (ref 70–99)
Potassium: 3.8 mEq/L (ref 3.5–5.1)
Sodium: 144 mEq/L (ref 135–145)

## 2018-07-04 MED ORDER — HYDROCHLOROTHIAZIDE 12.5 MG PO CAPS: 12.5000 mg | ORAL_CAPSULE | Freq: Every day | ORAL | 6 refills | Status: DC

## 2018-07-04 NOTE — Addendum Note (Signed)
Addended by: Lamar Blinks C on: 07/04/2018 03:28 PM   Modules accepted: Orders

## 2018-07-04 NOTE — Patient Instructions (Addendum)
It was good to see you again today congrats on driving here today!   You might ask about a tetanus shot when you go for your 2nd shingles vaccine.  You are also due for a tetanus booster  I will be in touch with your labs today to see how your kidneys have responded to halving your fluid pill BP looks fine

## 2018-07-07 ENCOUNTER — Ambulatory Visit: Payer: Self-pay | Admitting: *Deleted

## 2018-07-07 NOTE — Telephone Encounter (Signed)
Pt calling with complaints of diarrhea since Monday. Pt states she has not had any episodes today and states that they usually occur at night. Pt states last night she had approximately 3 episodes of watery diarrhea. Pt denies any recent antibiotic use, fever or other symptoms at this time. Pt given home care advice and advised to return call to the office with worsening symptoms. Pt verbalized understanding.  Reason for Disposition . MILD-MODERATE diarrhea (e.g., 1-6 times / day more than normal)  Answer Assessment - Initial Assessment Questions 1. DIARRHEA SEVERITY: "How bad is the diarrhea?" "How many extra stools have you had in the past 24 hours than normal?"    - NO DIARRHEA (SCALE 0)   - MILD (SCALE 1-3): Few loose or mushy BMs; increase of 1-3 stools over normal daily number of stools; mild increase in ostomy output.   -  MODERATE (SCALE 4-7): Increase of 4-6 stools daily over normal; moderate increase in ostomy output. * SEVERE (SCALE 8-10; OR 'WORST POSSIBLE'): Increase of 7 or more stools daily over normal; moderate increase in ostomy output; incontinence.     None today, mostly starts in the middle of the night really bad on Monday and Tuesday 2. ONSET: "When did the diarrhea begin?"      [place] 3. BM CONSISTENCY: "How loose or watery is the diarrhea?"      watery 4. VOMITING: "Are you also vomiting?" If so, ask: "How many times in the past 24 hours?"      No 5. ABDOMINAL PAIN: "Are you having any abdominal pain?" If yes: "What does it feel like?" (e.g., crampy, dull, intermittent, constant)      No 6. ABDOMINAL PAIN SEVERITY: If present, ask: "How bad is the pain?"  (e.g., Scale 1-10; mild, moderate, or severe)   - MILD (1-3): doesn't interfere with normal activities, abdomen soft and not tender to touch    - MODERATE (4-7): interferes with normal activities or awakens from sleep, tender to touch    - SEVERE (8-10): excruciating pain, doubled over, unable to do any normal activities        Denies any pain 7. ORAL INTAKE: If vomiting, "Have you been able to drink liquids?" "How much fluids have you had in the past 24 hours?"     Yes gatorade and water 8. HYDRATION: "Any signs of dehydration?" (e.g., dry mouth [not just dry lips], too weak to stand, dizziness, new weight loss) "When did you last urinate?"     No 9. EXPOSURE: "Have you traveled to a foreign country recently?" "Have you been exposed to anyone with diarrhea?" "Could you have eaten any food that was spoiled?"     No 10. ANTIBIOTIC USE: "Are you taking antibiotics now or have you taken antibiotics in the past 2 months?"       no 11. OTHER SYMPTOMS: "Do you have any other symptoms?" (e.g., fever, blood in stool)       no 12. PREGNANCY: "Is there any chance you are pregnant?" "When was your last menstrual period?"       n/a  Protocols used: DIARRHEA-A-AH

## 2018-07-11 ENCOUNTER — Ambulatory Visit: Payer: Medicare Other | Admitting: Family Medicine

## 2018-07-14 ENCOUNTER — Inpatient Hospital Stay (HOSPITAL_COMMUNITY): Admission: RE | Admit: 2018-07-14 | Payer: Medicare Other | Source: Ambulatory Visit

## 2018-07-19 NOTE — Telephone Encounter (Signed)
Received notification that mychart message has not been read. Mailed letter of results to pt.

## 2018-07-21 ENCOUNTER — Ambulatory Visit (HOSPITAL_COMMUNITY)
Admission: RE | Admit: 2018-07-21 | Discharge: 2018-07-21 | Disposition: A | Discharge status: Home / Self Care | Payer: Medicare Other | Source: Ambulatory Visit | Attending: Cardiology | Admitting: Cardiology

## 2018-07-21 DIAGNOSIS — R0989 Other specified symptoms and signs involving the circulatory and respiratory systems: Secondary | ICD-10-CM | POA: Diagnosis not present

## 2018-07-22 IMAGING — US US ABDOMEN LIMITED
1 series · 14 of 25 positions shown · non-contrast
Comparison: None.

CLINICAL DATA: Right upper quadrant abdominal pain and tenderness
beginning today. Elevated white count.

EXAM:
US ABDOMEN LIMITED - RIGHT UPPER QUADRANT

[Series 1: us abdomen limited · 0.23mm/px · 14 of 48 slices shown]
[im 1/48]
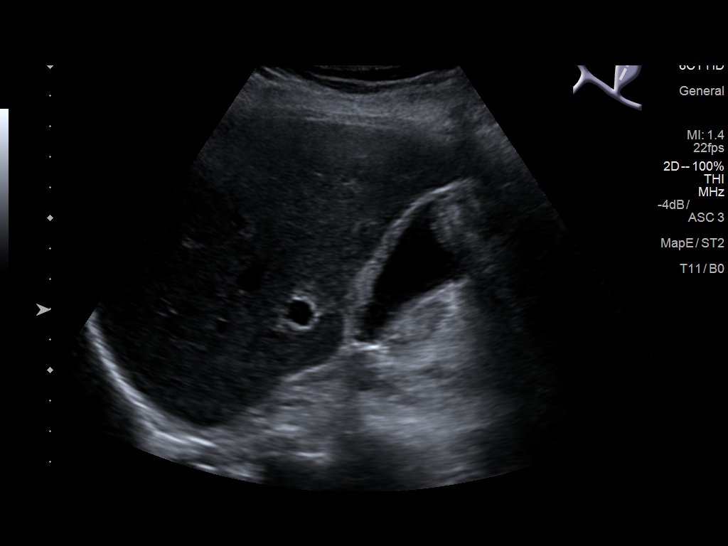
[im 4/48]
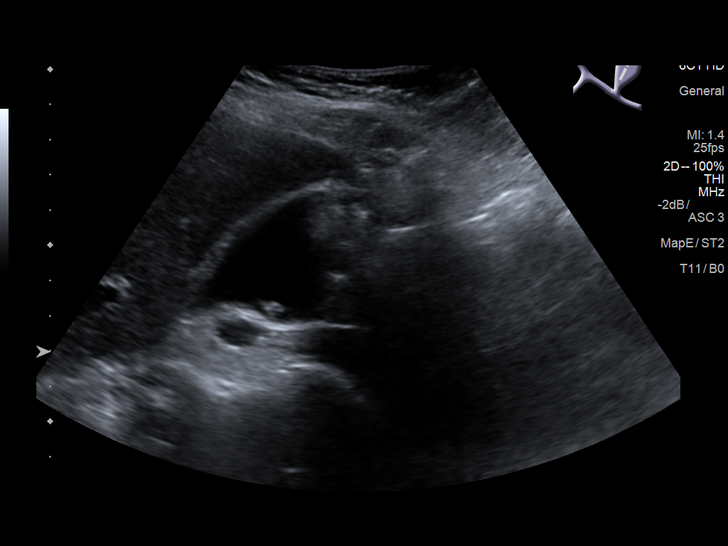
[im 8/48]
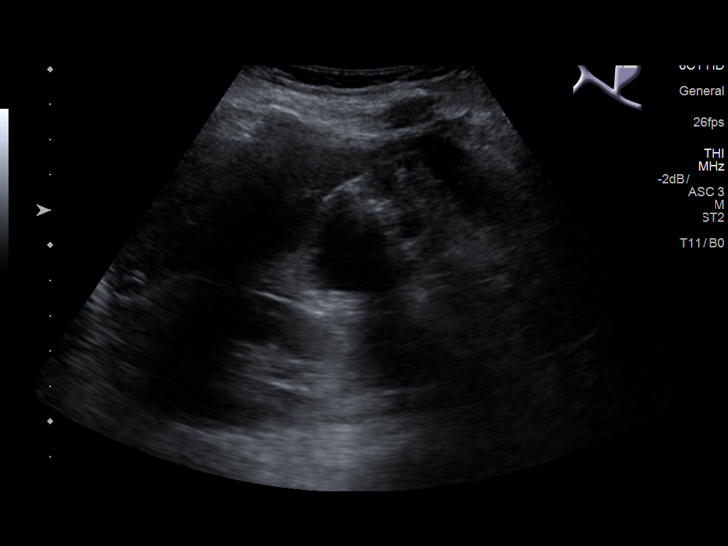
[im 12/48]
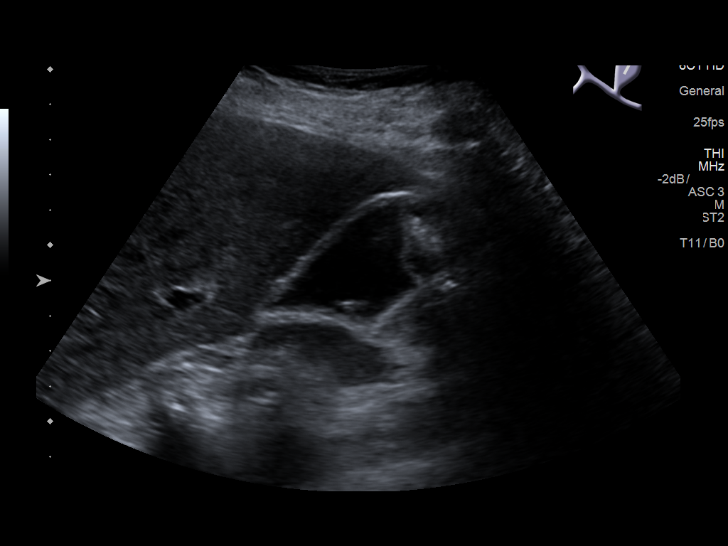
[im 16/48]
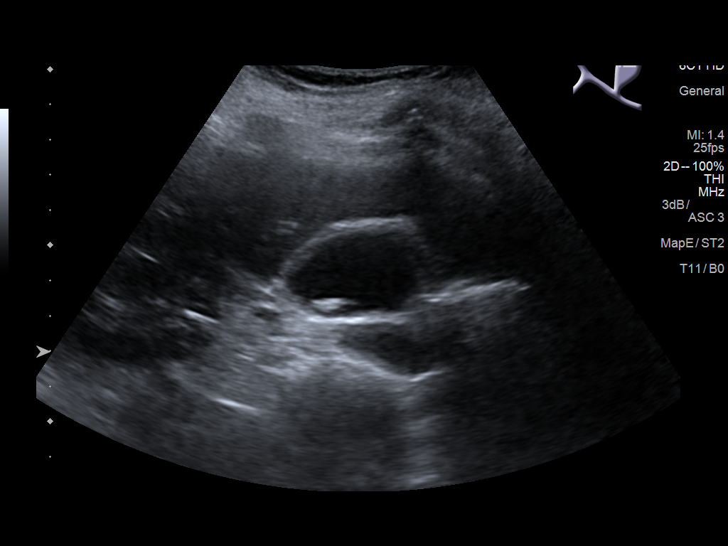
[im 18/48]
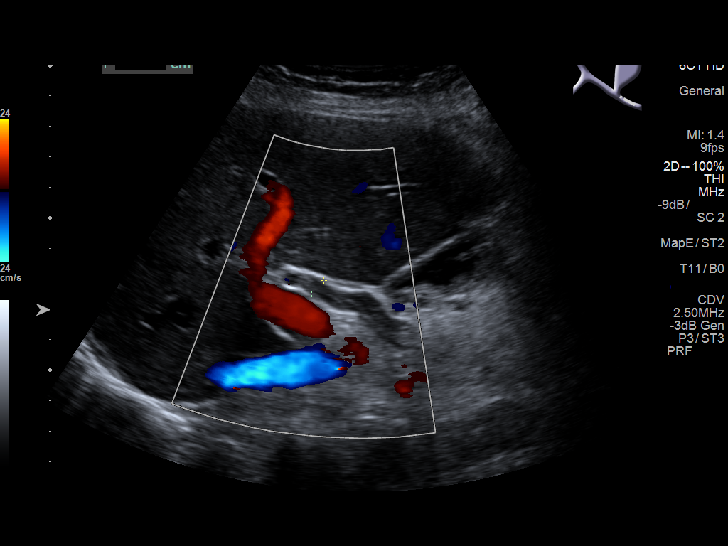
[im 22/48]
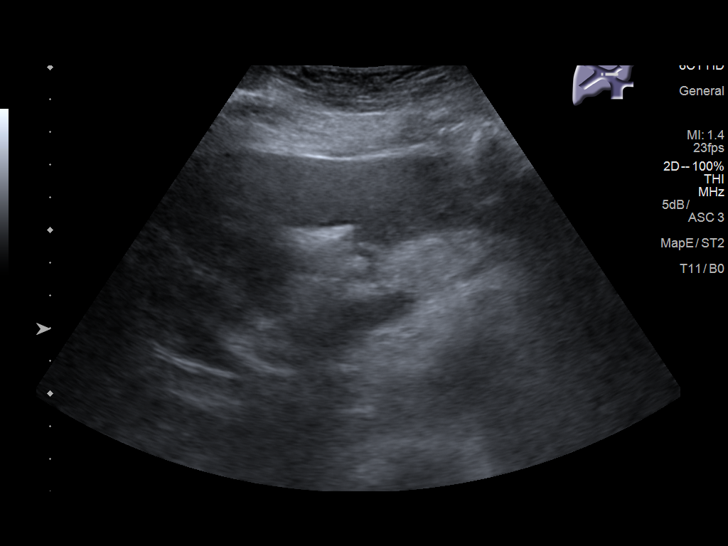
[im 26/48]
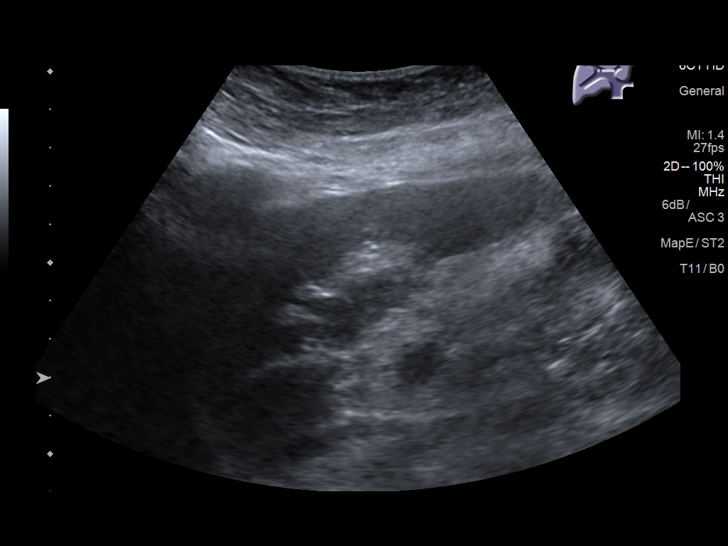
[im 30/48]
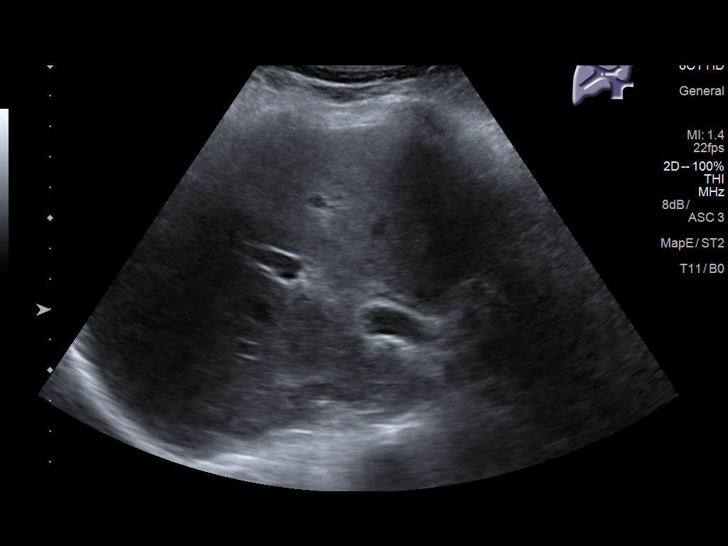
[im 32/48]
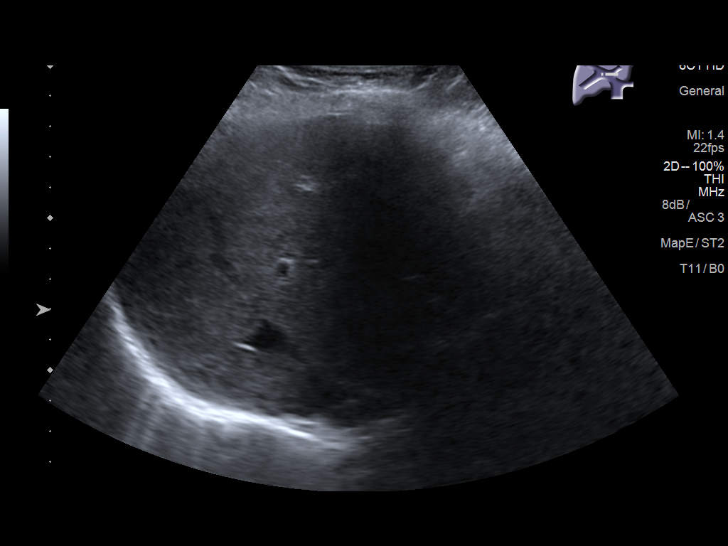
[im 36/48]
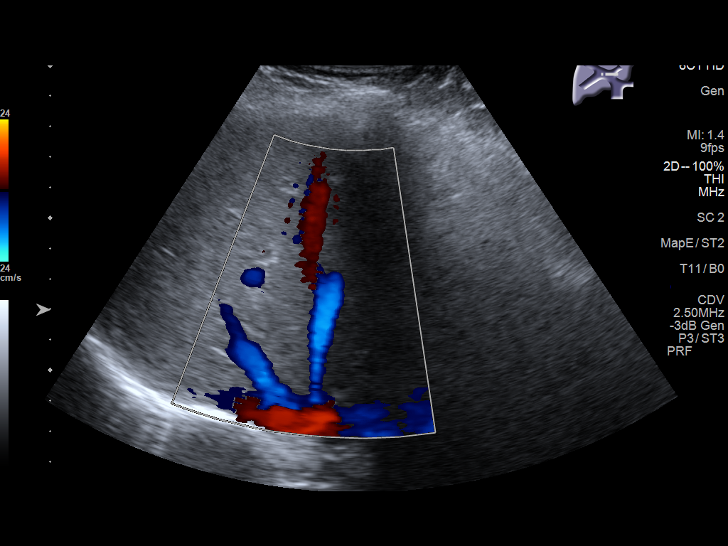
[im 40/48]
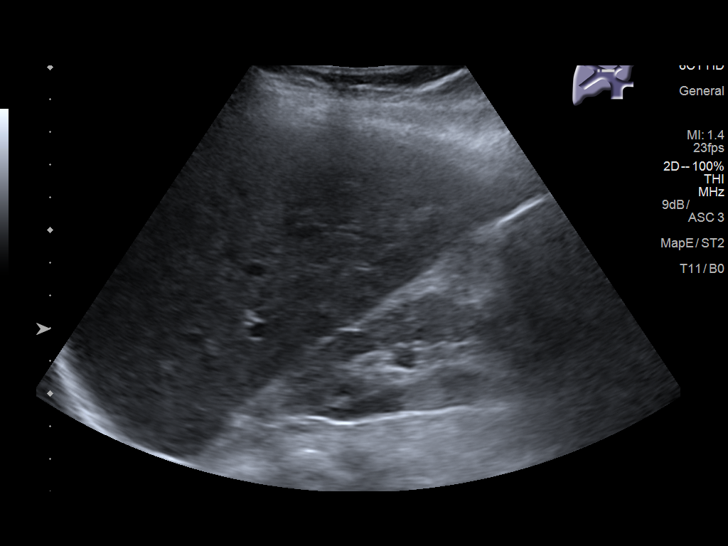
[im 44/48]
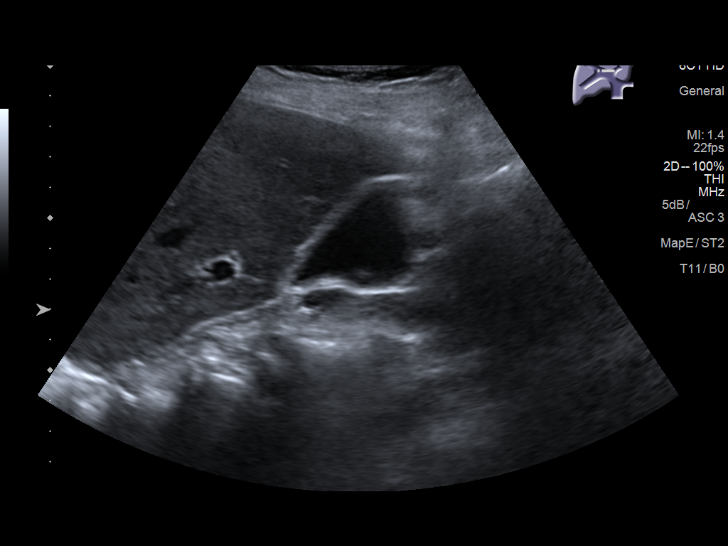
[im 48/48]
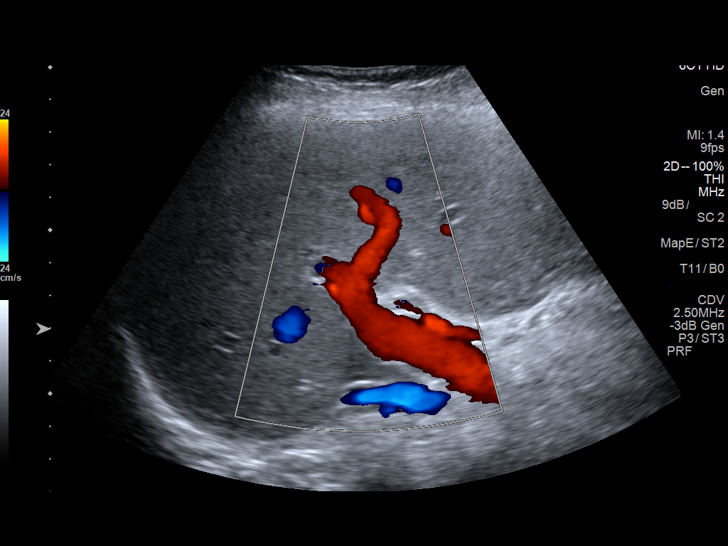

[14 of 25 positions shown; findings below may reference images not displayed]

FINDINGS: Gallbladder:

Echogenic sludge within the gallbladder. Gallstones up to 1.2 cm.
Wall thickening up to 4 mm suggesting cholecystitis. However, the
patient did not have a positive Murphy sign.

Common bile duct:

Diameter: 6 mm.  No definite ductal stone.

Liver:

Parenchyma appears normal in echogenicity. I think there is mild
intrahepatic ductal dilatation.
IMPRESSION: Findings worrisome for cholecystitis. Stones and echogenic sludge in
the gallbladder. Gallbladder wall thickening. No Murphy sign
however.

Mild biliary ductal prominence raises the possibility of a common
duct stone as well. This was not specifically visualized.

## 2018-07-25 ENCOUNTER — Other Ambulatory Visit: Payer: Self-pay | Admitting: Podiatry

## 2018-07-25 ENCOUNTER — Ambulatory Visit (INDEPENDENT_AMBULATORY_CARE_PROVIDER_SITE_OTHER): Payer: Medicare Other | Admitting: Podiatry

## 2018-07-25 DIAGNOSIS — L989 Disorder of the skin and subcutaneous tissue, unspecified: Secondary | ICD-10-CM

## 2018-07-25 DIAGNOSIS — M2011 Hallux valgus (acquired), right foot: Secondary | ICD-10-CM | POA: Diagnosis not present

## 2018-07-25 DIAGNOSIS — G629 Polyneuropathy, unspecified: Secondary | ICD-10-CM | POA: Diagnosis not present

## 2018-07-25 DIAGNOSIS — Q828 Other specified congenital malformations of skin: Secondary | ICD-10-CM

## 2018-07-25 NOTE — Patient Instructions (Signed)

## 2018-07-27 ENCOUNTER — Encounter: Payer: Self-pay | Admitting: Family Medicine

## 2018-07-28 ENCOUNTER — Telehealth: Payer: Self-pay | Admitting: Podiatry

## 2018-07-28 NOTE — Telephone Encounter (Signed)
I'm trying to get a refill of the Pregabalin 75 mg capsule sent to [facility] on [address].

## 2018-07-29 ENCOUNTER — Telehealth: Payer: Self-pay | Admitting: Podiatry

## 2018-07-29 ENCOUNTER — Telehealth: Payer: Self-pay | Admitting: *Deleted

## 2018-07-29 ENCOUNTER — Other Ambulatory Visit: Payer: Self-pay | Admitting: Podiatry

## 2018-07-29 MED ORDER — PREGABALIN 75 MG PO CAPS: 75.0000 mg | ORAL_CAPSULE | Freq: Two times a day (BID) | ORAL | 1 refills | Status: DC

## 2018-07-29 NOTE — Telephone Encounter (Signed)
I refilled lyrica 75mg  bid

## 2018-07-29 NOTE — Telephone Encounter (Signed)
I am calling on behalf of Dr. [name].  He had a cancellation in his schedule for next week.  He wanted me to call and offer it to you if you'd like to reschedule your surgery.  "I can't do it next week."  That's fine, have a great weekend.

## 2018-07-29 NOTE — Telephone Encounter (Signed)
Pt called upset stating she has been trying to get her medication refill for 3 days and the pharmacy does not have it.  I explained to her that it maybe that the dosing may change and that Dr [name] has been very busy with patients in the office  but there is a message for him for clarification and once he is able to it it will be taken care.

## 2018-07-30 NOTE — Progress Notes (Signed)
Subjective: 78 year old female presents the office today as she was to further discuss surgery due to continued pain on the bunion site on the right as well as to excise the soft tissue hyperpigmented lesion on the left ankle.  She states that she cannot wear shoes because the bunion on the right side.  She states the hyperpigmented lesion on the left side is been getting larger.  She also wants to have the calluses trimmed today as they are causing pain.  Denies any redness or drainage or increase in swelling.  She has no other concerns today.  Denies any fevers, chills, nausea, vomiting.  No calf pain, chest pain, shortness of breath.  Objective: AAO x3, NAD DP/PT pulses palpable bilaterally, CRT less than 3 seconds Hyperpigmented soft tissue lesion present anterior aspect the left ankle measured approximately 1.2 x 0.9 cm.  This is been removed previously but she states it come back larger.  She wants to have this area biopsied and removed.  This is unchanged in size since I have not seen her recently. Bony deformities present on the right foot there is tenderness palpation directly on the first metatarsal head medially along the bunion deformity.  There is significant bunion as well as hammertoe contractures present.  Hyperkeratotic lesions present the right foot submetatarsal as well as the arch of the foot as well as left foot submetatarsal 1.  Upon debridement there is no underlying ulceration, drainage or any signs of infection.  No open lesions or pre-ulcerative lesions.  No pain with calf compression, swelling, warmth, erythema  Assessment: Symptomatic bunion deformity right foot and skin lesion left ankle; symptomatic hyperkeratotic lesions  Plan: -All treatment options discussed with the patient including all alternatives, risks, complications.  -Reviewed the x-rays with her.  We discussed both surgical as well as conservative treatment.  Of time she wants to proceed with surgery.   Discussed shaving, silver bunionectomy on the right side as well as the excision hyperkeratotic lesion left side and biopsy.  We discussed the surgical aspects of course and she wishes to proceed. -The incision placement as well as the postoperative course was discussed with the patient. I discussed risks of the surgery which include, but not limited to, infection, bleeding, pain, swelling, need for further surgery, delayed or nonhealing, painful or ugly scar, numbness or sensation changes, over/under correction, recurrence, transfer lesions, further deformity, hardware failure, DVT/PE, loss of toe/foot. Patient understands these risks and wishes to proceed with surgery. The surgical consent was reviewed with the patient all 3 pages were signed. No promises or guarantees were given to the outcome of the procedure. All questions were answered to the best of my ability. Before the surgery the patient was encouraged to call the office if there is any further questions. The surgery will be performed at the [facility] on an outpatient basis. -Hyperkeratotic lesions were sharply debrided x4 without any complications or bleeding  Trula Slade DPM

## 2018-08-01 NOTE — Telephone Encounter (Signed)
I informed pt of Dr. Leigh Aurora orders being sent to the [facility].

## 2018-08-05 ENCOUNTER — Other Ambulatory Visit: Payer: Self-pay | Admitting: Family Medicine

## 2018-08-08 ENCOUNTER — Encounter: Payer: Medicare Other | Admitting: Podiatry

## 2018-08-10 ENCOUNTER — Other Ambulatory Visit: Payer: Self-pay | Admitting: Podiatry

## 2018-08-10 ENCOUNTER — Encounter: Payer: Self-pay | Admitting: Podiatry

## 2018-08-10 DIAGNOSIS — M25571 Pain in right ankle and joints of right foot: Secondary | ICD-10-CM | POA: Diagnosis not present

## 2018-08-10 DIAGNOSIS — M2011 Hallux valgus (acquired), right foot: Secondary | ICD-10-CM | POA: Diagnosis not present

## 2018-08-10 DIAGNOSIS — C4371 Malignant melanoma of right lower limb, including hip: Secondary | ICD-10-CM | POA: Diagnosis not present

## 2018-08-10 DIAGNOSIS — C4372 Malignant melanoma of left lower limb, including hip: Secondary | ICD-10-CM | POA: Diagnosis not present

## 2018-08-10 DIAGNOSIS — D2371 Other benign neoplasm of skin of right lower limb, including hip: Secondary | ICD-10-CM | POA: Diagnosis not present

## 2018-08-10 DIAGNOSIS — M79671 Pain in right foot: Secondary | ICD-10-CM | POA: Diagnosis not present

## 2018-08-10 DIAGNOSIS — M21611 Bunion of right foot: Secondary | ICD-10-CM | POA: Diagnosis not present

## 2018-08-10 DIAGNOSIS — I10 Essential (primary) hypertension: Secondary | ICD-10-CM | POA: Diagnosis not present

## 2018-08-10 DIAGNOSIS — M21612 Bunion of left foot: Secondary | ICD-10-CM | POA: Diagnosis not present

## 2018-08-10 DIAGNOSIS — D492 Neoplasm of unspecified behavior of bone, soft tissue, and skin: Secondary | ICD-10-CM | POA: Diagnosis not present

## 2018-08-10 MED ORDER — PROMETHAZINE HCL 25 MG PO TABS: 25.0000 mg | ORAL_TABLET | Freq: Three times a day (TID) | ORAL | 0 refills | Status: DC | PRN

## 2018-08-10 MED ORDER — CEPHALEXIN 500 MG PO CAPS: 500.0000 mg | ORAL_CAPSULE | Freq: Two times a day (BID) | ORAL | 0 refills | Status: DC

## 2018-08-10 MED ORDER — ACETAMINOPHEN-CODEINE #3 300-30 MG PO TABS: 1.0000 | ORAL_TABLET | ORAL | 0 refills | Status: DC | PRN

## 2018-08-10 NOTE — Progress Notes (Signed)
Postop medications sent to the pharmacy. Discussed medications with the patient.

## 2018-08-12 ENCOUNTER — Telehealth: Payer: Self-pay | Admitting: Podiatry

## 2018-08-12 ENCOUNTER — Encounter: Payer: Self-pay | Admitting: Podiatry

## 2018-08-12 ENCOUNTER — Telehealth: Payer: Self-pay | Admitting: *Deleted

## 2018-08-12 DIAGNOSIS — L989 Disorder of the skin and subcutaneous tissue, unspecified: Secondary | ICD-10-CM

## 2018-08-12 DIAGNOSIS — Z9889 Other specified postprocedural states: Secondary | ICD-10-CM

## 2018-08-12 NOTE — Telephone Encounter (Signed)
I asked pt if the blood had dried and she states she thinks so and I told pt that was okay it occurred occasionally. I told pt that if she liked she could mark the edges to see if spread. I told pt Dr. [name] would call later today to see how she is doing.

## 2018-08-12 NOTE — Addendum Note (Signed)
Addended by: Harriett Sine D on: 08/12/2018 01:41 PM   Modules accepted: Orders

## 2018-08-12 NOTE — Telephone Encounter (Signed)
Dr. [name] states he will contact pt and would like a referral placed to [facility].

## 2018-08-12 NOTE — Telephone Encounter (Addendum)
[facility] states fax referral and clinicals to [phone] Surgical Dermatology, they will review clinicals and contact pt if accept referral. Prepared referral, clinicals and demographics for referral to [facility], waiting pathology results.

## 2018-08-12 NOTE — Telephone Encounter (Signed)
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

## 2018-08-12 NOTE — Progress Notes (Signed)
I received a preliminary report from pathology stating that Ms. Mincer is a malignant melanoma maximal thickness of 1.3 mm with a Clark's level 4 however peripheral margin is involved.  I called patient to let her know the results.  She has had this lesion she states for very long time she is been under the care of dermatology for it.  She states that it has been frozen.  Upon speaking to her today she states that it went away some however it came back.  It has been getting larger.  We discussed biopsy of this couple months ago and have surgery on the bunion however she has delayed this given other issues.  We will place referral for melanoma clinic at [facility] or [place]. She is in agreement and understands.   Trula Slade

## 2018-08-12 NOTE — Telephone Encounter (Signed)
Prepared referral, clinicals, and demographics for fax to [facility] [phone].

## 2018-08-12 NOTE — Telephone Encounter (Signed)
Dr. [name] spoke with Margaret [name] and she would prefer to be sent to [facility] and [place].

## 2018-08-12 NOTE — Telephone Encounter (Signed)
Pt had surgery on 3.11.20 and noticed that she has bleed through her bandage. She states that it doesn't appear to still be bleeding but wanted to call and speak with the nurse to make sure she doesn't need to come in to be seen.

## 2018-08-15 ENCOUNTER — Ambulatory Visit (INDEPENDENT_AMBULATORY_CARE_PROVIDER_SITE_OTHER): Payer: Medicare Other | Admitting: Podiatry

## 2018-08-15 ENCOUNTER — Ambulatory Visit (INDEPENDENT_AMBULATORY_CARE_PROVIDER_SITE_OTHER): Payer: Medicare Other

## 2018-08-15 ENCOUNTER — Other Ambulatory Visit: Payer: Self-pay

## 2018-08-15 DIAGNOSIS — C4372 Malignant melanoma of left lower limb, including hip: Secondary | ICD-10-CM | POA: Insufficient documentation

## 2018-08-15 DIAGNOSIS — M2011 Hallux valgus (acquired), right foot: Secondary | ICD-10-CM

## 2018-08-15 NOTE — Telephone Encounter (Signed)
I requested pathology report from [facility] - [place] [facility] to be faxed to [phone].

## 2018-08-15 NOTE — Progress Notes (Signed)
Subjective: Margaret [name] is a 78 y.o. is seen today in office s/p right foot Silverman ectomy, left foot soft tissue mass excision preformed on 08/10/2018.  She said that she is been doing well she has had no pain since the surgery.  Denies any systemic complaints such as fevers, chills, nausea, vomiting. No calf pain, chest pain, shortness of breath.   She is still awaiting referral for [facility] melanoma clinic  Objective: General: No acute distress, AAOx3  DP/PT pulses palpable 2/4, CRT < 3 sec to all digits.  Protective sensation intact. Motor function intact.  Bilateral foot: Incision is well coapted without any evidence of dehiscence and sutures are intact. There is no surrounding erythema, ascending cellulitis, fluctuance, crepitus, malodor, drainage/purulence. There is mild edema around the surgical site on the right > left. There is no pain along the surgical site.  No other areas of tenderness to bilateral lower extremities.  No other open lesions or pre-ulcerative lesions.  No pain with calf compression, swelling, warmth, erythema.   Assessment and Plan:  Status post bilateral foot surgery, doing well with no complications   -Treatment options discussed including all alternatives, risks, and complications -We again reviewed the pathology in the left side which did reveal a malignant melanoma Clark's level 4 with approximate depth of 1.3 mm.  We placed referral to the [facility] clinic and she is awaiting appointment. -Antibiotic ointment and bandages applied to the incisions.  Keep dressing clean, dry, intact. -Continue with offloading shoe, boot. -Ice/elevation -Pain medication as needed. -Monitor for any clinical signs or symptoms of infection and DVT/PE and directed to call the office immediately should any occur or go to the ER. -Follow-up in 1 week or sooner if any problems arise. In the meantime, encouraged to call the office with any questions, concerns,  change in symptoms.   Celesta Gentile, DPM

## 2018-08-16 ENCOUNTER — Encounter: Payer: Self-pay | Admitting: Podiatry

## 2018-08-16 ENCOUNTER — Other Ambulatory Visit: Payer: Self-pay | Admitting: Podiatry

## 2018-08-16 DIAGNOSIS — C4372 Malignant melanoma of left lower limb, including hip: Secondary | ICD-10-CM

## 2018-08-17 ENCOUNTER — Ambulatory Visit: Payer: Medicare Other | Admitting: Obstetrics & Gynecology

## 2018-08-17 ENCOUNTER — Telehealth: Payer: Self-pay | Admitting: *Deleted

## 2018-08-17 DIAGNOSIS — C4372 Malignant melanoma of left lower limb, including hip: Secondary | ICD-10-CM

## 2018-08-17 DIAGNOSIS — L989 Disorder of the skin and subcutaneous tissue, unspecified: Secondary | ICD-10-CM

## 2018-08-17 DIAGNOSIS — M79661 Pain in right lower leg: Secondary | ICD-10-CM

## 2018-08-17 DIAGNOSIS — L819 Disorder of pigmentation, unspecified: Secondary | ICD-10-CM

## 2018-08-17 DIAGNOSIS — Z9889 Other specified postprocedural states: Secondary | ICD-10-CM

## 2018-08-17 DIAGNOSIS — M2011 Hallux valgus (acquired), right foot: Secondary | ICD-10-CM

## 2018-08-17 NOTE — Addendum Note (Signed)
Addended by: Harriett Sine D on: 08/17/2018 04:52 PM   Modules accepted: Orders

## 2018-08-17 NOTE — Telephone Encounter (Addendum)
I called pt and asked if she had redness, heat, hardness or pain from the right leg and pt states no only the bruising.

## 2018-08-17 NOTE — Telephone Encounter (Signed)
Patient called and stated that the right leg is now bruised up to the back of knee, no pain in leg bu tin heel. Surgery was  08/10/2018. Wants to know if this is normal.

## 2018-08-17 NOTE — Telephone Encounter (Signed)
Faxed orders to [facility] and left message informing [place] of the Urgent order.

## 2018-08-17 NOTE — Telephone Encounter (Signed)
Faxed referral, demographics and clinicals to [facility] - Surgical Oncology.

## 2018-08-17 NOTE — Telephone Encounter (Signed)
I spoke with Margaret [name], informed Margaret [name] and [name] her dtr of the 08/26/2018 arrive 8:45am and appt 9:00am. [name] states she has movers coming that day and will need to schedule.

## 2018-08-17 NOTE — Telephone Encounter (Signed)
[facility] - Surgical Dermatology called states pt needs to be scheduled with Surgical Oncology.

## 2018-08-17 NOTE — Telephone Encounter (Signed)
Margaret [name] states Margaret [name] is out of their perimeters, and transferred to Margaret [name]. Margaret [name] - Margaret [name] scheduled with Margaret [name] 08/26/2018 arrive 8:45am appt 9:00am. Margaret [name] - [phone], f [phone].

## 2018-08-17 NOTE — Telephone Encounter (Signed)
Dr. [name] ordered get venous doppler of the right leg rule out DVT. I informed pt and faxed orders to [facility].

## 2018-08-18 ENCOUNTER — Telehealth: Payer: Self-pay | Admitting: *Deleted

## 2018-08-18 ENCOUNTER — Ambulatory Visit (HOSPITAL_COMMUNITY)
Admission: RE | Admit: 2018-08-18 | Discharge: 2018-08-18 | Disposition: A | Discharge status: Home / Self Care | Payer: Medicare Other | Source: Ambulatory Visit | Attending: Cardiology | Admitting: Cardiology

## 2018-08-18 ENCOUNTER — Other Ambulatory Visit: Payer: Self-pay

## 2018-08-18 ENCOUNTER — Encounter (HOSPITAL_COMMUNITY): Payer: Medicare Other

## 2018-08-18 DIAGNOSIS — C4372 Malignant melanoma of left lower limb, including hip: Secondary | ICD-10-CM | POA: Diagnosis not present

## 2018-08-18 DIAGNOSIS — M79661 Pain in right lower leg: Secondary | ICD-10-CM | POA: Diagnosis not present

## 2018-08-18 DIAGNOSIS — L819 Disorder of pigmentation, unspecified: Secondary | ICD-10-CM | POA: Insufficient documentation

## 2018-08-18 DIAGNOSIS — L989 Disorder of the skin and subcutaneous tissue, unspecified: Secondary | ICD-10-CM | POA: Diagnosis not present

## 2018-08-18 DIAGNOSIS — M2011 Hallux valgus (acquired), right foot: Secondary | ICD-10-CM | POA: Diagnosis not present

## 2018-08-18 DIAGNOSIS — Z9889 Other specified postprocedural states: Secondary | ICD-10-CM | POA: Insufficient documentation

## 2018-08-18 NOTE — Addendum Note (Signed)
Addended by: Harriett Sine D on: 08/18/2018 09:55 AM   Modules accepted: Orders

## 2018-08-18 NOTE — Telephone Encounter (Signed)
-----   Message from Trula Slade, DPM sent at 08/16/2018 12:05 PM EDT ----- Tivis Ringer- can you please pass this along for the referral? Thanks.

## 2018-08-18 NOTE — Telephone Encounter (Signed)
Margaret [name] - [facility] states pt is negative for DVT, and pt has been informed.

## 2018-08-18 NOTE — Telephone Encounter (Signed)
Pathology results faxed to [facility] Surgical oncology department.

## 2018-08-18 NOTE — Telephone Encounter (Signed)
Thank you :)

## 2018-08-25 ENCOUNTER — Other Ambulatory Visit: Payer: Self-pay

## 2018-08-25 ENCOUNTER — Ambulatory Visit (INDEPENDENT_AMBULATORY_CARE_PROVIDER_SITE_OTHER): Payer: Medicare Other | Admitting: Podiatry

## 2018-08-25 ENCOUNTER — Encounter: Payer: Self-pay | Admitting: Podiatry

## 2018-08-25 DIAGNOSIS — C4372 Malignant melanoma of left lower limb, including hip: Secondary | ICD-10-CM

## 2018-08-25 DIAGNOSIS — B351 Tinea unguium: Secondary | ICD-10-CM | POA: Diagnosis not present

## 2018-08-25 DIAGNOSIS — M2011 Hallux valgus (acquired), right foot: Secondary | ICD-10-CM

## 2018-08-25 DIAGNOSIS — M79675 Pain in left toe(s): Secondary | ICD-10-CM

## 2018-08-25 DIAGNOSIS — M79674 Pain in right toe(s): Secondary | ICD-10-CM

## 2018-08-25 DIAGNOSIS — Q828 Other specified congenital malformations of skin: Secondary | ICD-10-CM | POA: Diagnosis not present

## 2018-08-25 DIAGNOSIS — G629 Polyneuropathy, unspecified: Secondary | ICD-10-CM

## 2018-08-25 NOTE — Progress Notes (Signed)
Subjective: Margaret [name] is a 78 y.o. is seen today in office s/p right foot Silver Bunionectomy, left foot soft tissue mass excision preformed on 08/10/2018.  She said that she is been doing well she has had no pain since the surgery.  She is scheduled for the melanoma clinic tomorrow at [facility].  In regards to her foot she is been doing well.  She is also super calluses and nails be trimmed as they are painful.  Denies any systemic complaints such as fevers, chills, nausea, vomiting. No calf pain, chest pain, shortness of breath.   Objective: General: No acute distress, AAOx3  DP/PT pulses palpable 2/4, CRT < 3 sec to all digits.  Protective sensation intact. Motor function intact.  Bilateral foot: Incision is well coapted without any evidence of dehiscence and sutures are intact. There is no surrounding erythema, ascending cellulitis, fluctuance, crepitus, malodor, drainage/purulence. There is mild edema around the surgical site on the right > left. There is no pain along the surgical site.  Ecchymosis to the right side and leg there is no pain with calf compression, erythema or warmth to this is been ongoing since surgery she had a venous duplex performed.  Mild swelling to the ankle and some discomfort to the ankle but no specific area of tenderness.  No recent injury. Hyperkeratotic lesion submetatarsal area bilaterally as well as right fifth metatarsal base.  No ongoing ulceration, drainage or signs of infection. Nails are hypertrophic, dystrophic, elongated there is tenderness nails 1-5 bilaterally. Erythema, drainage or any signs of infection. No other areas of tenderness to bilateral lower extremities.  No other open lesions or pre-ulcerative lesions.  No pain with calf compression, swelling, warmth, erythema.   Assessment and Plan:  Status post bilateral foot surgery, doing well with no complications   -Treatment options discussed including all alternatives, risks, and  complications -She is scheduled for the melanoma clinic tomorrow at [facility]. -Every other suture was removed without complications.  Antibiotic ointment was applied followed by a bandage. -Continue surgical shoes.  I think some of the ankle pain should be on the right side is more from wearing the surgical shoes. -Debrided hyperkeratotic lesions x3 without any complications or bleeding -Nails debrided x10 without any complications or bleeding.  Follow-up in 7 to 10 days for suture removal.  Trula Slade DPM

## 2018-08-26 ENCOUNTER — Encounter: Payer: Self-pay | Admitting: Podiatry

## 2018-08-26 DIAGNOSIS — C4372 Malignant melanoma of left lower limb, including hip: Secondary | ICD-10-CM | POA: Diagnosis not present

## 2018-09-01 ENCOUNTER — Ambulatory Visit: Payer: Medicare Other | Admitting: Obstetrics & Gynecology

## 2018-09-01 ENCOUNTER — Telehealth: Payer: Self-pay | Admitting: Podiatry

## 2018-09-01 NOTE — Telephone Encounter (Signed)
Patient needs to know if she can shower with the stitch in her foot from 3/11 surgery. Patient is having another surgery on 4/6 and needs to let the hospital know whether she can shower or not. Needs a phone call back today is possible.

## 2018-09-01 NOTE — Telephone Encounter (Signed)
Can you have her send me a picture of her incisions? If her daughter or her can facetime or do a video visit that would be helpful.

## 2018-09-02 ENCOUNTER — Telehealth: Payer: Self-pay | Admitting: *Deleted

## 2018-09-02 NOTE — Telephone Encounter (Signed)
Thank you :)

## 2018-09-02 NOTE — Telephone Encounter (Signed)
Pt called and asked if she would be able to use the surgery scrub on her surgery foot and what to do about the remaining stitch. I told pt Dr. [name] would like to have a e-visit or facetime and he could advise at that time. I transferred pt's dtr to [place]. [facility] - Scheduler.

## 2018-09-02 NOTE — Telephone Encounter (Signed)
Patient called and I spoke with the patient and the patient is having surgery on her left leg (melanoma removal) and stated that they (surgical center) would like for her to scrub both legs and I stated that would be fine and just to make sure that the right big toe is covered due to there is one stitch left and that we would see her at the next visit and to call the [place] office at [phone] if any concerns or questions. Margaret [name]

## 2018-09-02 NOTE — Telephone Encounter (Signed)
Great, thanks

## 2018-09-05 ENCOUNTER — Encounter: Payer: Medicare Other | Admitting: Podiatry

## 2018-09-05 DIAGNOSIS — G4700 Insomnia, unspecified: Secondary | ICD-10-CM | POA: Diagnosis not present

## 2018-09-05 DIAGNOSIS — N289 Disorder of kidney and ureter, unspecified: Secondary | ICD-10-CM | POA: Diagnosis not present

## 2018-09-05 DIAGNOSIS — L720 Epidermal cyst: Secondary | ICD-10-CM | POA: Diagnosis not present

## 2018-09-05 DIAGNOSIS — K219 Gastro-esophageal reflux disease without esophagitis: Secondary | ICD-10-CM | POA: Diagnosis not present

## 2018-09-05 DIAGNOSIS — G629 Polyneuropathy, unspecified: Secondary | ICD-10-CM | POA: Diagnosis not present

## 2018-09-05 DIAGNOSIS — Z87891 Personal history of nicotine dependence: Secondary | ICD-10-CM | POA: Diagnosis not present

## 2018-09-05 DIAGNOSIS — I10 Essential (primary) hypertension: Secondary | ICD-10-CM | POA: Diagnosis not present

## 2018-09-05 DIAGNOSIS — C4372 Malignant melanoma of left lower limb, including hip: Secondary | ICD-10-CM | POA: Diagnosis not present

## 2018-09-05 DIAGNOSIS — F419 Anxiety disorder, unspecified: Secondary | ICD-10-CM | POA: Diagnosis not present

## 2018-09-08 ENCOUNTER — Telehealth: Payer: Self-pay | Admitting: *Deleted

## 2018-09-08 NOTE — Telephone Encounter (Signed)
Called and left a message for the patient Per Dr [name] to see how patient was doing after having surgery on her left leg and stated that we would be here till 5 pm today and that we would be closed tomorrow (Friday) September 09, 2018 and to call the [name] office at [phone]. Margaret [name]

## 2018-09-12 ENCOUNTER — Encounter: Payer: Self-pay | Admitting: Podiatry

## 2018-09-12 ENCOUNTER — Ambulatory Visit (INDEPENDENT_AMBULATORY_CARE_PROVIDER_SITE_OTHER): Payer: Medicare Other | Admitting: Podiatry

## 2018-09-12 ENCOUNTER — Other Ambulatory Visit: Payer: Self-pay

## 2018-09-12 VITALS — Temp 98.1°F

## 2018-09-12 DIAGNOSIS — C4372 Malignant melanoma of left lower limb, including hip: Secondary | ICD-10-CM

## 2018-09-12 DIAGNOSIS — M2011 Hallux valgus (acquired), right foot: Secondary | ICD-10-CM

## 2018-09-14 ENCOUNTER — Telehealth: Payer: Self-pay | Admitting: Podiatry

## 2018-09-14 NOTE — Telephone Encounter (Signed)
Pt called to inform Dr.Wagoner she had gone in for her follow up appt for her legs after surgery and just wanted to let him know that everything went well with that appointment.

## 2018-09-14 NOTE — Progress Notes (Signed)
Subjective: Margaret [name] is a 78 y.o. is seen today in office s/p right foot Silver Bunionectomy, left foot soft tissue mass excision preformed on 08/10/2018.  She presents to remove the suture that was left on her right foot.  She states that overall she is been doing well.  He is wearing a surgical shoe on the right foot.  Her pain is controlled.    Since I last saw her she underwent sentinel lymph node biopsy of the left side as well as excision of melanoma.  She has a wound VAC intact left ankle she is seen the plastic surgeon tomorrow.  She states that she received a call saying that she is "cancer free".   Denies any systemic complaints such as fevers, chills, nausea, vomiting. No calf pain, chest pain, shortness of breath.   Objective: General: No acute distress, AAOx3  DP/PT pulses palpable 2/4, CRT < 3 sec to all digits.  Protective sensation intact. Motor function intact.  Bilateral foot: Incision is well coapted without any evidence of dehiscence with a suture intact on the right side.  Incision mad is well coapted.  There is no surrounding erythema, ascending cellulitis.  There is no fluctuation or crepitation or any malodor.  No significant discomfort in the surgical site on the right side.  Dressing intact the left leg which is left intact. No other open lesions or pre-ulcerative lesions.  No pain with calf compression, swelling, warmth, erythema.   Assessment and Plan:  Status post bilateral foot surgery, doing well with no complications   -Treatment options discussed including all alternatives, risks, and complications -Remove the suture on the right side without any complications.  Incision is doing very well.  Recommended moisturizer to the incision daily.  Continue with surgical shoe for now continue to ice and elevate.  However as she is doing better she can start to transition to regular shoe as she is able to. -I left the dressing intact the left side today we will  defer to plastic surgeon. -Monitor for any clinical signs or symptoms of infection and directed to call the office immediately should any occur or go to the ER.  Return in about 3 weeks (around 10/03/2018).  Trula Slade DPM

## 2018-09-19 ENCOUNTER — Other Ambulatory Visit: Payer: Self-pay | Admitting: Family Medicine

## 2018-09-19 DIAGNOSIS — F5102 Adjustment insomnia: Secondary | ICD-10-CM

## 2018-09-23 DIAGNOSIS — Z483 Aftercare following surgery for neoplasm: Secondary | ICD-10-CM | POA: Diagnosis not present

## 2018-09-23 DIAGNOSIS — C4372 Malignant melanoma of left lower limb, including hip: Secondary | ICD-10-CM | POA: Diagnosis not present

## 2018-09-26 ENCOUNTER — Other Ambulatory Visit: Payer: Self-pay | Admitting: Podiatry

## 2018-09-26 NOTE — Telephone Encounter (Signed)
Lyrica refills called to [facility].

## 2018-09-29 ENCOUNTER — Telehealth: Payer: Self-pay | Admitting: Podiatry

## 2018-09-29 NOTE — Telephone Encounter (Signed)
Pt had surgery on 08/10/18 and is experiencing swelling in her surgery foot and is concerned if this is normal or not. Pt requested a call back from the nurse. Pt is scheduled for a post-op appt on 10/04/18.

## 2018-09-29 NOTE — Telephone Encounter (Addendum)
I called pt and asked if she was having more activity with the right foot, since she had additional surgery on the left foot. Pt states she had and was told to begin gradual transfer to a regular shoe. I told pt she may be having to do more activity on the right foot due to the left foot surgery, and she may need to slow down the transition to the regular shoe. I told pt, I recommended pt wear an athletic shoe as long as it was comfortable, once uncomfortable go back into the surgery shoe, then begin again the following day, with the goal to be able to wear the athletic shoe her entire weightbearing day. Pt states understanding. I also asked pt if she had redness or red streaking or calf pain and she denied.

## 2018-09-30 DIAGNOSIS — Z85820 Personal history of malignant melanoma of skin: Secondary | ICD-10-CM | POA: Diagnosis not present

## 2018-09-30 DIAGNOSIS — Z08 Encounter for follow-up examination after completed treatment for malignant neoplasm: Secondary | ICD-10-CM | POA: Insufficient documentation

## 2018-09-30 NOTE — Telephone Encounter (Signed)
I informed pt of Dr. Leigh Aurora recommendation of the compression sock and to make an appt for the callous trim and pick up the sock at the same time. Pt agreed and I told her I would have a scheduler call her.

## 2018-09-30 NOTE — Telephone Encounter (Signed)
She can also wear a compression sock to help with the swelling. She is more than welcome to come in for an x-ray if she wants. She also needs an appointment anyway for her normal callus trim.

## 2018-10-03 ENCOUNTER — Other Ambulatory Visit: Payer: Self-pay | Admitting: Family Medicine

## 2018-10-04 ENCOUNTER — Other Ambulatory Visit: Payer: Medicare Other | Admitting: Podiatry

## 2018-10-06 ENCOUNTER — Ambulatory Visit (INDEPENDENT_AMBULATORY_CARE_PROVIDER_SITE_OTHER): Payer: Medicare Other

## 2018-10-06 ENCOUNTER — Encounter: Payer: Self-pay | Admitting: Podiatry

## 2018-10-06 ENCOUNTER — Other Ambulatory Visit: Payer: Self-pay

## 2018-10-06 ENCOUNTER — Ambulatory Visit (INDEPENDENT_AMBULATORY_CARE_PROVIDER_SITE_OTHER): Payer: Medicare Other | Admitting: Podiatry

## 2018-10-06 VITALS — Temp 97.8°F

## 2018-10-06 DIAGNOSIS — G629 Polyneuropathy, unspecified: Secondary | ICD-10-CM

## 2018-10-06 DIAGNOSIS — M2011 Hallux valgus (acquired), right foot: Secondary | ICD-10-CM

## 2018-10-06 DIAGNOSIS — C4372 Malignant melanoma of left lower limb, including hip: Secondary | ICD-10-CM

## 2018-10-06 DIAGNOSIS — Q828 Other specified congenital malformations of skin: Secondary | ICD-10-CM | POA: Diagnosis not present

## 2018-10-10 NOTE — Progress Notes (Signed)
Subjective: 78 year old female presents the office today for follow-up evaluation after undergoing right silver bunionectomy.  She said the bunion is done very well.  She is eager to get back to a regular shoe on the right side.  She presents today also to have calluses trimmed to both feet.  She still has a wound on the anterior aspect left ankle but she is seen the plastic surgeon for. Denies any systemic complaints such as fevers, chills, nausea, vomiting. No acute changes since last appointment, and no other complaints at this time.   Objective: AAO x3, NAD DP/PT pulses palpable bilaterally, CRT less than 3 seconds Incision from the bunion surgery is well-healed with a scar.  There is no tenderness palpation of this area.  There is no edema to this area.  No erythema or warmth. Hyperkeratotic lesions present bilaterally.  There is 2 areas on plantar aspect the left foot and one on the right foot.  Upon debridement of the lesions there is no underlying ulceration drainage or any signs of infection. Large wound present the anterior aspect of the left ankle with skin graft present.  There is fibrotic, granulation tissue present as well but there is no drainage or pus.  No cellulitis present.  No fluctuation or crepitation. No open lesions or pre-ulcerative lesions.  No pain with calf compression, swelling, warmth, erythema  Assessment: Status post right foot bunionectomy with hyperkeratotic lesions bilaterally; melanoma excision by [facility] plastic surgery  Plan: -All treatment options discussed with the patient including all alternatives, risks, complications.  -X-rays were obtained reviewed.  No evidence of acute fracture.  Significant bunion deformity as well as digital contractures.  Arthritic changes present. -From a surgical standpoint of the right side she is able to go back to regular shoe as she is able.  Continue ice and elevation. -I debrided hyperkeratotic lesions x3 without any  complications or bleeding -We will defer to plastic surgery in regards to the left ankle wound.  No signs of infection today.  Area is redressed. -Monitor for any clinical signs or symptoms of infection and directed to call the office immediately should any occur or go to the ER.  Return in about 4 weeks (around 11/03/2018).  Trula Slade DPM

## 2018-10-12 DIAGNOSIS — I890 Lymphedema, not elsewhere classified: Secondary | ICD-10-CM | POA: Diagnosis not present

## 2018-10-12 DIAGNOSIS — D225 Melanocytic nevi of trunk: Secondary | ICD-10-CM | POA: Diagnosis not present

## 2018-10-12 DIAGNOSIS — L821 Other seborrheic keratosis: Secondary | ICD-10-CM | POA: Diagnosis not present

## 2018-10-12 DIAGNOSIS — L814 Other melanin hyperpigmentation: Secondary | ICD-10-CM | POA: Diagnosis not present

## 2018-10-19 ENCOUNTER — Ambulatory Visit (INDEPENDENT_AMBULATORY_CARE_PROVIDER_SITE_OTHER): Payer: Medicare Other | Admitting: Family Medicine

## 2018-10-19 ENCOUNTER — Encounter: Payer: Self-pay | Admitting: Family Medicine

## 2018-10-19 ENCOUNTER — Other Ambulatory Visit: Payer: Self-pay

## 2018-10-19 DIAGNOSIS — L03314 Cellulitis of groin: Secondary | ICD-10-CM

## 2018-10-19 MED ORDER — AMOXICILLIN-POT CLAVULANATE 875-125 MG PO TABS: 1.0000 | ORAL_TABLET | Freq: Two times a day (BID) | ORAL | 0 refills | Status: DC

## 2018-10-19 NOTE — Progress Notes (Signed)
[facility] [address] [place], [place] [address] [phone]  Date:  10/19/2018   Name:  Margaret [name]   DOB:  16-Nov-1940   MRN:  [id number]  PCP:  Darreld Mclean, MD    Chief Complaint: No chief complaint on file.   History of Present Illness:  Margaret [name] is a 78 y.o. very pleasant female patient who presents with the following:  Virtual visit today for concern of ankle issue Pt location is home Provider location is office Pt ID confirmed with 2 identifiers, she gives consent for visit today Her daughter Margaret [name] is also on the call   She was found to have a skin lesion on her LEFT ankle by her podiatrist, Dr. [name], back in March.  He did a biopsy, it was found to be melanoma. She had a deeper excision and left groin lymph node dissection by Dr. Johney Maine with general surgery on 4/6 She saw general surgery for follow-up on 5/1 and seemed to be doing ok at that time However she now notes that the groin site has been red for about 2 weeks, the redness seems to be spreading and it is tender No fever or chills She is feeling generally ok otherwise-no flulike symptoms The area in her groin is worsening per her daughter but the same per pt    Patient Active Problem List   Diagnosis Date Noted  . Encounter for postoperative examination after surgery for malignant neoplasm 09/30/2018  . Malignant melanoma of left foot ([place]) 08/15/2018  . Plantar fasciitis of right foot 02/03/2017  . Porokeratosis 02/03/2017  . Acute pancreatitis due to calculus of common bile duct 09/17/2016  . Acute gallstone pancreatitis   . Calculus of gallbladder with acute cholecystitis and obstruction   . LFT elevation   . Stress fracture 01/01/2016  . Insomnia 12/16/2015  . Peripheral neuropathy 10/18/2015  . Renal insufficiency 03/06/2015  . HTN (hypertension) 03/06/2015  . Osteopenia 03/06/2015  . GERD  (gastroesophageal reflux disease) 03/06/2015  . Vaginal pessary present 03/06/2015    Past Medical History:  Diagnosis Date  . Arthritis   . History of healed stress fracture 2013   bilateral feet  . Hx of phlebitis    reports remote hx of "superfical blood clots" never anticoagulated  . Hypertension   . Osteopenia     Past Surgical History:  Procedure Laterality Date  . CHOLECYSTECTOMY N/A 09/19/2016   Procedure: LAPAROSCOPIC CHOLECYSTECTOMY POSSIBLE INTRAOPERATIVE CHOLANGIOGRAM;  Surgeon: Rolm Bookbinder, MD;  Location: [facility];  Service: General;  Laterality: N/A;  . ERCP N/A 09/18/2016   Procedure: ENDOSCOPIC RETROGRADE CHOLANGIOPANCREATOGRAPHY (ERCP);  Surgeon: Doran Stabler, MD;  Location: [facility] ENDOSCOPY;  Service: Endoscopy;  Laterality: N/A;  . FOOT SURGERY     patient reports four foot surgeries  . FOOT SURGERY Left    bunion, hammer toe surgery  . FOOT SURGERY Right    shaved bunion, hammer toe correction  . KNEE ARTHROSCOPY Right 1997  . REPLACEMENT TOTAL KNEE Left   . 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 RETINAL DETACHMENT SURGERY    . RETINAL DETACHMENT SURGERY Left 2008  . THYROIDECTOMY, PARTIAL Right   . THYROIDECTOMY, PARTIAL  2005    Social History   Tobacco Use  . Smoking status: Former Smoker    Last attempt to quit: 06/02/1979    Years since quitting: 39.4  . Smokeless tobacco: Never Used  Substance Use Topics  . Alcohol use: Yes    Alcohol/week: 1.0 standard drinks    Types: 1 Glasses of wine per week    Comment: Occ  . Drug use: No    Family History  Problem Relation Age of Onset  . Heart disease Mother   . Heart attack Mother   . Heart disease Father   . Diabetes Maternal Grandmother   . Cancer Paternal Grandfather        stomach    Allergies  Allergen Reactions  . Percocet [Oxycodone-Acetaminophen] Other (See Comments)    Pt. reports that the medication causes her to be light headed and pass out.    Medication list has been  reviewed and updated.  Current Outpatient Medications on File Prior to Visit  Medication Sig Dispense Refill  . acetaminophen (TYLENOL) 500 MG tablet Take by mouth.    Marland Kitchen acetaminophen-codeine (TYLENOL #3) 300-30 MG tablet Take 1 tablet by mouth every 4 (four) hours as needed for moderate pain. 25 tablet 0  . Calcium Carbonate-Vitamin D (CALCIUM HIGH POTENCY/VITAMIN D) 600-200 MG-UNIT TABS Take by mouth.    . celecoxib (CELEBREX) 100 MG capsule TAKE 1 CAPSULE(100 MG) BY MOUTH TWICE DAILY AS NEEDED 60 capsule 3  . cephALEXin (KEFLEX) 500 MG capsule Take 1 capsule (500 mg total) by mouth 2 (two) times daily. 20 capsule 0  . Cranberry (THERACRAN HP) 180 MG CAPS Take by mouth.    . docusate sodium (COLACE) 100 MG capsule Take by mouth.    Mariane Baumgarten Sodium (STOOL SOFTENER LAXATIVE PO) Take 1 tablet by mouth daily.    . hydrochlorothiazide (MICROZIDE) 12.5 MG capsule Take 1 capsule (12.5 mg total) by mouth daily. 30 capsule 6  . ketoconazole (NIZORAL) 2 % cream     . LORazepam (ATIVAN) 2 MG tablet TAKE 1-1&1/2 TABLETS BY MOUTH EVERY NIGHT AT BEDTIME AS NEEDED FOR SLEEP MAY TAKE 1 TABLET DURING DAY IF NEEDED 70 tablet 2  . metoprolol succinate (TOPROL-XL) 25 MG 24 hr tablet TAKE 1 TABLET(25 MG) BY MOUTH DAILY 90 tablet 1  . Multiple Vitamins-Minerals (WOMENS DAILY FORMULA) TABS Take 1 tablet by mouth daily.    Marland Kitchen omeprazole (PRILOSEC) 20 MG capsule TAKE 1 CAPSULE BY MOUTH EVERY DAY AS NEEDED 90 capsule 3  . oxyCODONE-acetaminophen (PERCOCET/ROXICET) 5-325 MG tablet TK 1 T PO Q 4 H PRN FOR UP TO 5 DAYS    . pregabalin (LYRICA) 75 MG capsule TAKE 1 CAPSULE(75 MG) BY MOUTH TWICE DAILY 60 capsule 5  . promethazine (PHENERGAN) 25 MG tablet Take 1 tablet (25 mg total) by mouth every 8 (eight) hours as needed for nausea or vomiting. 20 tablet 0   No current facility-administered medications on file prior to visit.     Review of Systems:  As per HPI- otherwise negative.  No fever or chills, no  cough Physical Examination: There were no vitals filed for this visit. There were no vitals filed for this visit. There is no height or weight on file to calculate BMI. Ideal Body Weight:    Virtual visit today Pt observed on video She looks well, her normal self.  No cough, tachypnea, distress is noted. They showed me her groin site over video, I do appreciate some redness but cannot get further detail through this medium  Assessment and Plan: Cellulitis of groin - Plan: amoxicillin-clavulanate (AUGMENTIN) 875-125 MG tablet  Virtual visit today for apparent cellulitis of left groin following lymph node dissection in April.  It  seems that infection has started over the last 2 weeks. I am working from home today, so not able to bring her to the clinic at this moment.  I offered ER evaluation versus seeing her in the clinic tomorrow.  Tyara feels comfortable waiting to see me tomorrow, she feels as though her groin site is stable.  I will go ahead and have her start Augmentin now however, asked her to take 2 doses today if possible If she gets worse overnight, they will go to the emergency department I scheduled an appointment for tomorrow  Signed Lamar Blinks, MD

## 2018-10-20 ENCOUNTER — Encounter (HOSPITAL_BASED_OUTPATIENT_CLINIC_OR_DEPARTMENT_OTHER): Payer: Self-pay

## 2018-10-20 ENCOUNTER — Encounter: Payer: Medicare Other | Admitting: Obstetrics & Gynecology

## 2018-10-20 ENCOUNTER — Encounter: Payer: Self-pay | Admitting: Family Medicine

## 2018-10-20 ENCOUNTER — Emergency Department (HOSPITAL_BASED_OUTPATIENT_CLINIC_OR_DEPARTMENT_OTHER)
Admission: EM | Admit: 2018-10-20 | Discharge: 2018-10-20 | Disposition: A | Discharge status: Home / Self Care | Payer: Medicare Other | Attending: Emergency Medicine | Admitting: Emergency Medicine

## 2018-10-20 ENCOUNTER — Other Ambulatory Visit: Payer: Self-pay

## 2018-10-20 ENCOUNTER — Ambulatory Visit (INDEPENDENT_AMBULATORY_CARE_PROVIDER_SITE_OTHER): Payer: Medicare Other | Admitting: Family Medicine

## 2018-10-20 VITALS — BP 130/82 | HR 89 | Temp 98.0°F | Resp 16 | Ht 61.0 in | Wt 167.0 lb

## 2018-10-20 DIAGNOSIS — I809 Phlebitis and thrombophlebitis of unspecified site: Secondary | ICD-10-CM | POA: Diagnosis not present

## 2018-10-20 DIAGNOSIS — L041 Acute lymphadenitis of trunk: Secondary | ICD-10-CM | POA: Diagnosis not present

## 2018-10-20 DIAGNOSIS — L03314 Cellulitis of groin: Secondary | ICD-10-CM | POA: Diagnosis not present

## 2018-10-20 DIAGNOSIS — Z87891 Personal history of nicotine dependence: Secondary | ICD-10-CM | POA: Insufficient documentation

## 2018-10-20 DIAGNOSIS — I10 Essential (primary) hypertension: Secondary | ICD-10-CM | POA: Diagnosis not present

## 2018-10-20 DIAGNOSIS — Z79899 Other long term (current) drug therapy: Secondary | ICD-10-CM | POA: Diagnosis not present

## 2018-10-20 DIAGNOSIS — C439 Malignant melanoma of skin, unspecified: Secondary | ICD-10-CM | POA: Insufficient documentation

## 2018-10-20 DIAGNOSIS — Z96652 Presence of left artificial knee joint: Secondary | ICD-10-CM | POA: Insufficient documentation

## 2018-10-20 DIAGNOSIS — K219 Gastro-esophageal reflux disease without esophagitis: Secondary | ICD-10-CM | POA: Diagnosis not present

## 2018-10-20 DIAGNOSIS — R2242 Localized swelling, mass and lump, left lower limb: Secondary | ICD-10-CM | POA: Diagnosis present

## 2018-10-20 DIAGNOSIS — M96842 Postprocedural seroma of a musculoskeletal structure following a musculoskeletal system procedure: Secondary | ICD-10-CM | POA: Insufficient documentation

## 2018-10-20 DIAGNOSIS — T888XXA Other specified complications of surgical and medical care, not elsewhere classified, initial encounter: Secondary | ICD-10-CM | POA: Insufficient documentation

## 2018-10-20 DIAGNOSIS — T8143XA Infection following a procedure, organ and space surgical site, initial encounter: Secondary | ICD-10-CM | POA: Diagnosis not present

## 2018-10-20 DIAGNOSIS — L7682 Other postprocedural complications of skin and subcutaneous tissue: Secondary | ICD-10-CM | POA: Diagnosis not present

## 2018-10-20 LAB — CBC WITH DIFFERENTIAL/PLATELET
Abs Immature Granulocytes: 0.05 10*3/uL (ref 0.00–0.07)
Basophils Absolute: 0 10*3/uL (ref 0.0–0.1)
Basophils Relative: 0 %
Eosinophils Absolute: 0.1 10*3/uL (ref 0.0–0.5)
Eosinophils Relative: 1 %
HCT: 36.3 % (ref 36.0–46.0)
Hemoglobin: 11.4 g/dL — ABNORMAL LOW (ref 12.0–15.0)
Immature Granulocytes: 0 %
Lymphocytes Relative: 16 %
Lymphs Abs: 1.7 10*3/uL (ref 0.7–4.0)
MCH: 30.6 pg (ref 26.0–34.0)
MCHC: 31.4 g/dL (ref 30.0–36.0)
MCV: 97.3 fL (ref 80.0–100.0)
Monocytes Absolute: 0.6 10*3/uL (ref 0.1–1.0)
Monocytes Relative: 6 %
Neutro Abs: 8.6 10*3/uL — ABNORMAL HIGH (ref 1.7–7.7)
Neutrophils Relative %: 77 %
Platelets: 284 10*3/uL (ref 150–400)
RBC: 3.73 MIL/uL — ABNORMAL LOW (ref 3.87–5.11)
RDW: 13.6 % (ref 11.5–15.5)
WBC: 11.2 10*3/uL — ABNORMAL HIGH (ref 4.0–10.5)
nRBC: 0 % (ref 0.0–0.2)

## 2018-10-20 LAB — COMPREHENSIVE METABOLIC PANEL
ALT: 13 U/L (ref 0–44)
AST: 16 U/L (ref 15–41)
Albumin: 3.1 g/dL — ABNORMAL LOW (ref 3.5–5.0)
Alkaline Phosphatase: 78 U/L (ref 38–126)
Anion gap: 9 (ref 5–15)
BUN: 20 mg/dL (ref 8–23)
CO2: 27 mmol/L (ref 22–32)
Calcium: 8.9 mg/dL (ref 8.9–10.3)
Chloride: 106 mmol/L (ref 98–111)
Creatinine, Ser: 1.08 mg/dL — ABNORMAL HIGH (ref 0.44–1.00)
GFR calc Af Amer: 57 mL/min — ABNORMAL LOW (ref >60)
GFR calc non Af Amer: 49 mL/min — ABNORMAL LOW (ref >60)
Glucose, Bld: 100 mg/dL — ABNORMAL HIGH (ref 70–99)
Potassium: 3.5 mmol/L (ref 3.5–5.1)
Sodium: 142 mmol/L (ref 135–145)
Total Bilirubin: 0.2 mg/dL — ABNORMAL LOW (ref 0.3–1.2)
Total Protein: 7.3 g/dL (ref 6.5–8.1)

## 2018-10-20 LAB — LACTIC ACID, PLASMA: Lactic Acid, Venous: 1.4 mmol/L (ref 0.5–1.9)

## 2018-10-20 LAB — PROTIME-INR
INR: 1 (ref 0.8–1.2)
Prothrombin Time: 12.6 seconds (ref 11.4–15.2)

## 2018-10-20 MED ORDER — SODIUM CHLORIDE 0.9 % IV SOLN
1.0000 g | Freq: Once | INTRAVENOUS | Status: AC
  Administered 2018-10-20: 1 g via INTRAVENOUS
  Filled 2018-10-20: qty 10

## 2018-10-20 MED ORDER — VANCOMYCIN HCL IN DEXTROSE 1-5 GM/200ML-% IV SOLN
1000.0000 mg | Freq: Once | INTRAVENOUS | Status: DC
  Filled 2018-10-20: qty 200

## 2018-10-20 NOTE — ED Notes (Signed)
Tried to call [phone] but no one answers the phone.  Will try to call later.

## 2018-10-20 NOTE — ED Triage Notes (Signed)
Pt sent from PCP for swelling to left groin-NAD-slow gait

## 2018-10-20 NOTE — ED Provider Notes (Signed)
[facility] EMERGENCY DEPARTMENT Provider Note   CSN: [id number] Arrival date & time: 10/20/18  1443    History   Chief Complaint Chief Complaint  Patient presents with  . Groin Swelling    HPI Margaret [name] is a 78 y.o. female.     HPI Patient had a melanoma removed from her left lower leg [phone] by Dr.Votanopoulos at [facility].  At that time also a sentinel node biopsy was done in the inguinal region.  Patient has pictures of the postoperative appearance of the sentinel node biopsy region.  There was a approximately 5 cm sutured wound without significant surrounding erythema or swelling.  Over the course of the past 3 weeks, patient has started to develop a large amount of swelling in a focal area under the biopsy site and over the past week to 2 expanding, dense erythema.  She denies that this has been painful.  She has not had fevers or chills.  She did see her dermatologist for a check approximately 1 week ago.  At that time the dermatologist aspirated the large nodule at the inguinal surgical site due to concern for the amount of swelling.  Patient got report several days ago that they did not identify any infection or concerning results from this aspirate.  Patient however was seen by her PCP and a tele-visit where this extensive erythema was observed.  Patient was started on Augmentin and is on her third dose.  She was referred by her PCP to come to the emergency department for evaluation of lab work and CT scanning for further diagnostic evaluation of the swelling and redness.  She has not had a follow-up with Dr. Johney Maine since that region became quite swollen and red. Past Medical History:  Diagnosis Date  . Arthritis   . History of healed stress fracture 2013   bilateral feet  . Hx of phlebitis    reports remote hx of "superfical blood clots" never anticoagulated  . Hypertension   . Osteopenia     Patient Active Problem List    Diagnosis Date Noted  . Encounter for postoperative examination after surgery for malignant neoplasm 09/30/2018  . Malignant melanoma of left foot ([place]) 08/15/2018  . Plantar fasciitis of right foot 02/03/2017  . Porokeratosis 02/03/2017  . Acute pancreatitis due to calculus of common bile duct 09/17/2016  . Acute gallstone pancreatitis   . Calculus of gallbladder with acute cholecystitis and obstruction   . LFT elevation   . Stress fracture 01/01/2016  . Insomnia 12/16/2015  . Peripheral neuropathy 10/18/2015  . Renal insufficiency 03/06/2015  . HTN (hypertension) 03/06/2015  . Osteopenia 03/06/2015  . GERD (gastroesophageal reflux disease) 03/06/2015  . Vaginal pessary present 03/06/2015    Past Surgical History:  Procedure Laterality Date  . CHOLECYSTECTOMY N/A 09/19/2016   Procedure: LAPAROSCOPIC CHOLECYSTECTOMY POSSIBLE INTRAOPERATIVE CHOLANGIOGRAM;  Surgeon: Rolm Bookbinder, MD;  Location: [facility];  Service: General;  Laterality: N/A;  . ERCP N/A 09/18/2016   Procedure: ENDOSCOPIC RETROGRADE CHOLANGIOPANCREATOGRAPHY (ERCP);  Surgeon: Doran Stabler, MD;  Location: [facility] ENDOSCOPY;  Service: Endoscopy;  Laterality: N/A;  . FOOT SURGERY     patient reports four foot surgeries  . FOOT SURGERY Left    bunion, hammer toe surgery  . FOOT SURGERY Right    shaved bunion, hammer toe correction  . KNEE ARTHROSCOPY Right 1997  . REPLACEMENT TOTAL KNEE Left   . 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 RETINAL DETACHMENT SURGERY    .  RETINAL DETACHMENT SURGERY Left 2008  . THYROIDECTOMY, PARTIAL Right   . THYROIDECTOMY, PARTIAL  2005     OB History    Gravida  4   Para  3   Term  3   Preterm  0   AB  1   Living  3     SAB  1   TAB  0   Ectopic  0   Multiple      Live Births               Home Medications    Prior to Admission medications   Medication Sig Start Date End Date Taking? Authorizing Provider  acetaminophen (TYLENOL) 500 MG tablet Take by  mouth.    [provider]  acetaminophen-codeine (TYLENOL #3) 300-30 MG tablet Take 1 tablet by mouth every 4 (four) hours as needed for moderate pain. 08/10/18   Trula Slade, DPM  Calcium Carbonate-Vitamin D (CALCIUM HIGH POTENCY/VITAMIN D) 600-200 MG-UNIT TABS Take by mouth. 12/22/07   [provider]  celecoxib (CELEBREX) 100 MG capsule TAKE 1 CAPSULE(100 MG) BY MOUTH TWICE DAILY AS NEEDED 10/03/18   Copland, Gay Filler, MD  Cranberry (THERACRAN HP) 180 MG CAPS Take by mouth.    [provider]  docusate sodium (COLACE) 100 MG capsule Take by mouth.    [provider]  Docusate Sodium (STOOL SOFTENER LAXATIVE PO) Take 1 tablet by mouth daily.    [provider]  hydrochlorothiazide (MICROZIDE) 12.5 MG capsule Take 1 capsule (12.5 mg total) by mouth daily. 07/04/18   Copland, Gay Filler, MD  ketoconazole (NIZORAL) 2 % cream  07/18/18   [provider]  LORazepam (ATIVAN) 2 MG tablet TAKE 1-1&1/2 TABLETS BY MOUTH EVERY NIGHT AT BEDTIME AS NEEDED FOR SLEEP MAY TAKE 1 TABLET DURING DAY IF NEEDED 09/20/18   Copland, Gay Filler, MD  metoprolol succinate (TOPROL-XL) 25 MG 24 hr tablet TAKE 1 TABLET(25 MG) BY MOUTH DAILY 08/05/18   Copland, Gay Filler, MD  Multiple Vitamins-Minerals (WOMENS DAILY FORMULA) TABS Take 1 tablet by mouth daily.    [provider]  omeprazole (PRILOSEC) 20 MG capsule TAKE 1 CAPSULE BY MOUTH EVERY DAY AS NEEDED 05/02/18   Copland, Gay Filler, MD  oxyCODONE-acetaminophen (PERCOCET/ROXICET) 5-325 MG tablet TK 1 T PO Q 4 H PRN FOR UP TO 5 DAYS 09/05/18   [provider]  pregabalin (LYRICA) 75 MG capsule TAKE 1 CAPSULE(75 MG) BY MOUTH TWICE DAILY 09/26/18   Trula Slade, DPM  promethazine (PHENERGAN) 25 MG tablet Take 1 tablet (25 mg total) by mouth every 8 (eight) hours as needed for nausea or vomiting. 08/10/18   Trula Slade, DPM    Family History Family History  Problem Relation Age of Onset  . Heart  disease Mother   . Heart attack Mother   . Heart disease Father   . Diabetes Maternal Grandmother   . Cancer Paternal Grandfather        stomach    Social History Social History   Tobacco Use  . Smoking status: Former Smoker    Last attempt to quit: 06/02/1979    Years since quitting: 39.4  . Smokeless tobacco: Never Used  Substance Use Topics  . Alcohol use: Yes    Alcohol/week: 1.0 standard drinks    Types: 1 Glasses of wine per week    Comment: Occ  . Drug use: No     Allergies   Keflex [cephalexin] and Percocet [oxycodone-acetaminophen]  Review of Systems Review of Systems 10 Systems reviewed and are negative for acute change except as noted in the HPI.   Physical Exam Updated Vital Signs BP (!) 153/90 (BP Location: Right Arm)   Pulse 90   Temp 98.7 F (37.1 C) (Oral)   Resp 20   SpO2 99%   Physical Exam Constitutional:      Appearance: Normal appearance.  HENT:     Head: Normocephalic and atraumatic.  Eyes:     Extraocular Movements: Extraocular movements intact.  Neck:     Musculoskeletal: Neck supple.  Cardiovascular:     Rate and Rhythm: Normal rate and regular rhythm.     Pulses: Normal pulses.     Heart sounds: Normal heart sounds.  Pulmonary:     Effort: Pulmonary effort is normal.     Breath sounds: Normal breath sounds.  Abdominal:     General: There is no distension.     Palpations: Abdomen is soft.     Tenderness: There is no abdominal tenderness. There is no guarding.  Musculoskeletal:     Comments: Has a 15 cm tense swollen nodule in the upper left thigh just below the inguinal crease.  There is not edema or erythema that extends into or over the inguinal crease.  She does however have lymphadenopathy of about 3-4 nodes that are about a centimeter and a half each.  They are firm and nontender.  The large tense swollen nodule is not significantly tender to palpation.  He then has a dense plaque of erythema that extends approximately 30 x  20 cm.  See attached images.  This is blanching but also not tender.  The skin graft on the anterior lower leg is healing but there are areas of granulation tissue with wet thick eschar.  There is not surrounding erythema.  He has about 1+ pitting edema of the lower leg generally.  The dorsalis pedis pulse in the femoral pulses are both 2+ and strong.  She does not endorse any significant pain in the leg itself.  See attached images  Lymphadenopathy:     Cervical: No cervical adenopathy.  Skin:    General: Skin is warm and dry.  Neurological:     General: No focal deficit present.     Mental Status: She is alert and oriented to person, place, and time.     Coordination: Coordination normal.  Psychiatric:        Mood and Affect: Mood normal.              ED Treatments / Results  Labs (all labs ordered are listed, but only abnormal results are displayed) Labs Reviewed  CBC WITH DIFFERENTIAL/PLATELET - Abnormal; Notable for the following components:      Result Value   WBC 11.2 (*)    RBC 3.73 (*)    Hemoglobin 11.4 (*)    Neutro Abs 8.6 (*)    All other components within normal limits  CULTURE, BLOOD (ROUTINE X 2)  CULTURE, BLOOD (ROUTINE X 2)  PROTIME-INR  LACTIC ACID, PLASMA  LACTIC ACID, PLASMA  C-REACTIVE PROTEIN  COMPREHENSIVE METABOLIC PANEL    EKG None  Radiology No results found.  Procedures Procedures (including critical care time)  Medications Ordered in ED Medications  vancomycin (VANCOCIN) IVPB 1000 mg/200 mL premix (has no administration in time range)  cefTRIAXone (ROCEPHIN) 1 g in sodium chloride 0.9 % 100 mL IVPB (1 g Intravenous New Bag/Given 10/20/18 1600)     Initial Impression /  Assessment and Plan / ED Course  I have reviewed the triage vital signs and the nursing notes.  Pertinent labs & imaging results that were available during my care of the patient were reviewed by me and considered in my medical decision making (see chart for  details).       Consult: Reviewed with Dr. Dalphine Handing, advises that the patient will need assessment for seroma/cellulitis and probable drain placement.  I advised that we did not have an interventional radiologist at this facility to place a drain and as the patient was going to have to transfer anyway, she should then go to [facility] emergency department for evaluation so assessment and management could be guided by Dr. Johney Maine.  Patient is alert and appropriate.  She is nontoxic.  She does not have any significant amount of pain associated with her area of swelling and erythema.  This seems to be more suggestive of seroma based on lack of pain plus her dermatologist aspirated approximately a week ago and per the patient's report, it came back negative for any infection.  Patient already been started on Augmentin and has had 3 doses.  Patient has empirically been given 1 dose of Rocephin while being evaluated in the emergency department, to cover for potential cellulitis until definitive clarification of seroma versus infectious abscess/cellulitis. patient is stable for transport with her daughter by private vehicle.  Final Clinical Impressions(s) / ED Diagnoses   Final diagnoses:  Malignant melanoma, unspecified site [facility])  Seroma of musculoskeletal structure after musculoskeletal system procedure    ED Discharge Orders    None       Charlesetta Shanks, MD 10/20/18 1708

## 2018-10-20 NOTE — ED Notes (Signed)
1st set of blood cultures obtained from [facility] at 1540, 2nd set of cultures obtained from [facility] at 1545

## 2018-10-20 NOTE — ED Notes (Signed)
Report given to Cannon Kettle at [facility].

## 2018-10-20 NOTE — Progress Notes (Signed)
[facility] [place], [place], [place] [address] [phone]  Date:  10/20/2018   Name:  Margaret [name]   DOB:  1940-10-24   MRN:  [id number]  PCP:  Darreld Mclean, MD    Chief Complaint: Cellulitis and Trigger Finger (would like injection)   History of Present Illness:  Margaret [name] is a 78 y.o. very pleasant female patient who presents with the following: History of HTN, GERD, pancreatitis, loss of spouse and now melanoma  In person visit today due to concern about pain and swelling in her left groin following lymph node dissection See virtual visit notes from yesterday-  She was found to have a skin lesion on her LEFT ankle by her podiatrist, Dr. [name], back in March.  He did a biopsy, it was found to be melanoma. She had a deeper excision and left groin lymph node dissection by Dr. Johney Maine with [facility] general surgery on 4/6 She saw general surgery for follow-up on 5/1 and seemed to be doing ok at that time However she now notes that the groin site has been red for about 2 weeks, the redness seems to be spreading and it is tender. No fever or chills. She is feeling generally ok otherwise-no flulike symptoms The area in her groin is worsening per her daughter but the same per pt  I started her on Augmentin yesterday and we made this appt Her daughter also sent me the following notes: Right after surgerythat was the only area that hurt her. As time went on she was in more pain and this lump appeared. She went back to the Dr and dr examined it. mom told her it didn't hurt but she told me it was killing her. She also had a low-grade fever the day/night before the appt & begged me not to tell Dr. I wasn't allowed in the appointment but dr called me on my cell in the waiting room. I told her about the fever. She wasn't concerned because it was 100.1 she said if it gets higher let her know. As you see from the  photos there was no redness at that appt.. The redness came on probably a week and a half after that visit and the lump continued to grow. she has been very confused with the time frame since her surgeries. She is very scared of bad results but I told her she has to be honest with the dr's so they can help her. she sometimes says everything feels fine just because she's really scared she's going to have to go in the hospital. I just wanted you to know the timeline.   She has gotten 3 doses of Augmentin - no improvement as of yet  No recent fevers noted  Coutney notes that the area of redness in her groin seems to be getting gradually larger.  It is not all that tender, and she does not feel bad overall  Spoke with interventional radiology, they recommended a CT of the pelvis first to get more information.  This patient will require labs prior to CT scan, which I am not able to obtain in clinic.  Patient Active Problem List   Diagnosis Date Noted  . Encounter for postoperative examination after surgery for malignant neoplasm 09/30/2018  . Malignant melanoma of left foot ([place]) 08/15/2018  . Plantar fasciitis of right foot 02/03/2017  . Porokeratosis 02/03/2017  . Acute pancreatitis due to  calculus of common bile duct 09/17/2016  . Acute gallstone pancreatitis   . Calculus of gallbladder with acute cholecystitis and obstruction   . LFT elevation   . Stress fracture 01/01/2016  . Insomnia 12/16/2015  . Peripheral neuropathy 10/18/2015  . Renal insufficiency 03/06/2015  . HTN (hypertension) 03/06/2015  . Osteopenia 03/06/2015  . GERD (gastroesophageal reflux disease) 03/06/2015  . Vaginal pessary present 03/06/2015    Past Medical History:  Diagnosis Date  . Arthritis   . History of healed stress fracture 2013   bilateral feet  . Hx of phlebitis    reports remote hx of "superfical blood clots" never anticoagulated  . Hypertension   . Osteopenia     Past Surgical History:  Procedure  Laterality Date  . CHOLECYSTECTOMY N/A 09/19/2016   Procedure: LAPAROSCOPIC CHOLECYSTECTOMY POSSIBLE INTRAOPERATIVE CHOLANGIOGRAM;  Surgeon: Rolm Bookbinder, MD;  Location: [facility];  Service: General;  Laterality: N/A;  . ERCP N/A 09/18/2016   Procedure: ENDOSCOPIC RETROGRADE CHOLANGIOPANCREATOGRAPHY (ERCP);  Surgeon: Doran Stabler, MD;  Location: [facility] ENDOSCOPY;  Service: Endoscopy;  Laterality: N/A;  . FOOT SURGERY     patient reports four foot surgeries  . FOOT SURGERY Left    bunion, hammer toe surgery  . FOOT SURGERY Right    shaved bunion, hammer toe correction  . KNEE ARTHROSCOPY Right 1997  . REPLACEMENT TOTAL KNEE Left   . 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 RETINAL DETACHMENT SURGERY    . RETINAL DETACHMENT SURGERY Left 2008  . THYROIDECTOMY, PARTIAL Right   . THYROIDECTOMY, PARTIAL  2005    Social History   Tobacco Use  . Smoking status: Former Smoker    Last attempt to quit: 06/02/1979    Years since quitting: 39.4  . Smokeless tobacco: Never Used  Substance Use Topics  . Alcohol use: Yes    Alcohol/week: 1.0 standard drinks    Types: 1 Glasses of wine per week    Comment: Occ  . Drug use: No    Family History  Problem Relation Age of Onset  . Heart disease Mother   . Heart attack Mother   . Heart disease Father   . Diabetes Maternal Grandmother   . Cancer Paternal Grandfather        stomach    Allergies  Allergen Reactions  . Keflex [Cephalexin] Diarrhea  . Percocet [Oxycodone-Acetaminophen] Other (See Comments)    Pt. reports that the medication causes her to be light headed and pass out.    Medication list has been reviewed and updated.  Current Outpatient Medications on File Prior to Visit  Medication Sig Dispense Refill  . acetaminophen (TYLENOL) 500 MG tablet Take by mouth.    Marland Kitchen acetaminophen-codeine (TYLENOL #3) 300-30 MG tablet Take 1 tablet by mouth every 4 (four) hours as needed for moderate pain. 25 tablet 0  . Calcium Carbonate-Vitamin  D (CALCIUM HIGH POTENCY/VITAMIN D) 600-200 MG-UNIT TABS Take by mouth.    . celecoxib (CELEBREX) 100 MG capsule TAKE 1 CAPSULE(100 MG) BY MOUTH TWICE DAILY AS NEEDED 60 capsule 3  . Cranberry (THERACRAN HP) 180 MG CAPS Take by mouth.    . docusate sodium (COLACE) 100 MG capsule Take by mouth.    Mariane Baumgarten Sodium (STOOL SOFTENER LAXATIVE PO) Take 1 tablet by mouth daily.    . hydrochlorothiazide (MICROZIDE) 12.5 MG capsule Take 1 capsule (12.5 mg total) by mouth daily. 30 capsule 6  . ketoconazole (NIZORAL) 2 % cream     . LORazepam (  ATIVAN) 2 MG tablet TAKE 1-1&1/2 TABLETS BY MOUTH EVERY NIGHT AT BEDTIME AS NEEDED FOR SLEEP MAY TAKE 1 TABLET DURING DAY IF NEEDED 70 tablet 2  . metoprolol succinate (TOPROL-XL) 25 MG 24 hr tablet TAKE 1 TABLET(25 MG) BY MOUTH DAILY 90 tablet 1  . Multiple Vitamins-Minerals (WOMENS DAILY FORMULA) TABS Take 1 tablet by mouth daily.    Marland Kitchen omeprazole (PRILOSEC) 20 MG capsule TAKE 1 CAPSULE BY MOUTH EVERY DAY AS NEEDED 90 capsule 3  . oxyCODONE-acetaminophen (PERCOCET/ROXICET) 5-325 MG tablet TK 1 T PO Q 4 H PRN FOR UP TO 5 DAYS    . pregabalin (LYRICA) 75 MG capsule TAKE 1 CAPSULE(75 MG) BY MOUTH TWICE DAILY 60 capsule 5  . promethazine (PHENERGAN) 25 MG tablet Take 1 tablet (25 mg total) by mouth every 8 (eight) hours as needed for nausea or vomiting. 20 tablet 0   No current facility-administered medications on file prior to visit.     Review of Systems:  As per HPI- otherwise negative.   Physical Examination: Vitals:   10/20/18 1411  BP: 130/82  Pulse: 89  Resp: 16  Temp: 98 F (36.7 C)  SpO2: 99%   Vitals:   10/20/18 1411  Weight: 167 lb (75.8 kg)  Height: 5\' 1"  (1.549 m)   Body mass index is 31.55 kg/m. Ideal Body Weight: Weight in (lb) to have BMI = 25: 132  GEN: WDWN, NAD, Non-toxic, A & O x 3, looks well, her normal self.  Accompanied today by her daughter HEENT: Atraumatic, Normocephalic. Neck supple. No masses, No LAD. Ears and Nose:  No external deformity. CV: RRR, No M/G/R. No JVD. No thrill. No extra heart sounds. PULM: CTA B, no wheezes, crackles, rhonchi. No retractions. No resp. distress. No accessory muscle use. ABD: S, NT, ND, +BD EXTR: No c/c/e NEURO Normal gait.  PSYCH: Normally interactive. Conversant. Not depressed or anxious appearing.  Calm demeanor.  Skin graft site from left lateral thigh has healed well  The left groin displays a large area of erythema which involves the anterior and medial thigh, mild tenderness.  There is a central, swollen fluctuant area which is approximately 10 x 10 cm.  It is not as tender as I would expect to see with an abscess. The melanoma surgery site on the left anterior ankle is dressed  Assessment and Plan: Cellulitis of groin Seen in clinic today with cellulitis of the left groin, following sentinel node biopsy about 8 weeks ago. I am concerned that she has a fluid collection, either related to lymph node removal or infection.  In any case her physical exam is fairly impressive, I feel that she urgently needs further evaluation.  She and her daughter will proceed to the emergency department here at the med center for labs, CT scan, and further evaluation.  I appreciate ER care of this nice patient  Signed Lamar Blinks, MD

## 2018-10-20 NOTE — ED Notes (Signed)
ED Provider at bedside. 

## 2018-10-21 DIAGNOSIS — T888XXA Other specified complications of surgical and medical care, not elsewhere classified, initial encounter: Secondary | ICD-10-CM | POA: Diagnosis not present

## 2018-10-21 DIAGNOSIS — C439 Malignant melanoma of skin, unspecified: Secondary | ICD-10-CM | POA: Diagnosis present

## 2018-10-21 DIAGNOSIS — L041 Acute lymphadenitis of trunk: Secondary | ICD-10-CM | POA: Diagnosis present

## 2018-10-21 DIAGNOSIS — I10 Essential (primary) hypertension: Secondary | ICD-10-CM | POA: Diagnosis present

## 2018-10-21 DIAGNOSIS — Z87891 Personal history of nicotine dependence: Secondary | ICD-10-CM | POA: Diagnosis not present

## 2018-10-21 DIAGNOSIS — K219 Gastro-esophageal reflux disease without esophagitis: Secondary | ICD-10-CM | POA: Diagnosis present

## 2018-10-21 DIAGNOSIS — T8143XA Infection following a procedure, organ and space surgical site, initial encounter: Secondary | ICD-10-CM | POA: Diagnosis present

## 2018-10-22 MED ORDER — GENERIC EXTERNAL MEDICATION
1.00 | Status: DC
Start: 2018-10-23 — End: 2018-10-22

## 2018-10-22 MED ORDER — METHYLPHENIDATE HCL POWD
25.00 | Status: DC
Start: ? — End: 2018-10-22

## 2018-10-22 MED ORDER — PREGABALIN 75 MG PO CAPS
75.00 | ORAL_CAPSULE | ORAL | Status: DC
Start: 2018-10-22 — End: 2018-10-22

## 2018-10-22 MED ORDER — MELATONIN 3 MG PO TABS
3.00 | ORAL_TABLET | ORAL | Status: DC
Start: 2018-10-22 — End: 2018-10-22

## 2018-10-22 MED ORDER — HYDROCHLOROTHIAZIDE 12.5 MG PO CAPS
12.50 | ORAL_CAPSULE | ORAL | Status: DC
Start: 2018-10-23 — End: 2018-10-22

## 2018-10-22 MED ORDER — ACETAMINOPHEN 500 MG PO TABS
500.00 | ORAL_TABLET | ORAL | Status: DC
Start: ? — End: 2018-10-22

## 2018-10-22 MED ORDER — PANTOPRAZOLE SODIUM 40 MG PO TBEC
40.00 | DELAYED_RELEASE_TABLET | ORAL | Status: DC
Start: 2018-10-23 — End: 2018-10-22

## 2018-10-22 MED ORDER — Medication
5.00 | Status: DC
Start: ? — End: 2018-10-22

## 2018-10-22 MED ORDER — ENOXAPARIN SODIUM 40 MG/0.4ML ~~LOC~~ SOLN
40.00 | SUBCUTANEOUS | Status: DC
Start: 2018-10-23 — End: 2018-10-22

## 2018-10-25 ENCOUNTER — Encounter: Payer: Self-pay | Admitting: Family Medicine

## 2018-10-25 DIAGNOSIS — L03314 Cellulitis of groin: Secondary | ICD-10-CM

## 2018-10-25 LAB — CULTURE, BLOOD (ROUTINE X 2)
Culture: NO GROWTH
Culture: NO GROWTH
Special Requests: ADEQUATE

## 2018-10-28 DIAGNOSIS — Z4800 Encounter for change or removal of nonsurgical wound dressing: Secondary | ICD-10-CM | POA: Diagnosis not present

## 2018-10-28 DIAGNOSIS — C4372 Malignant melanoma of left lower limb, including hip: Secondary | ICD-10-CM | POA: Diagnosis not present

## 2018-10-28 DIAGNOSIS — I10 Essential (primary) hypertension: Secondary | ICD-10-CM | POA: Diagnosis not present

## 2018-10-28 DIAGNOSIS — T8149XD Infection following a procedure, other surgical site, subsequent encounter: Secondary | ICD-10-CM | POA: Diagnosis not present

## 2018-10-28 DIAGNOSIS — Z87891 Personal history of nicotine dependence: Secondary | ICD-10-CM | POA: Diagnosis not present

## 2018-10-31 DIAGNOSIS — C4372 Malignant melanoma of left lower limb, including hip: Secondary | ICD-10-CM | POA: Diagnosis not present

## 2018-10-31 DIAGNOSIS — Z87891 Personal history of nicotine dependence: Secondary | ICD-10-CM | POA: Diagnosis not present

## 2018-10-31 DIAGNOSIS — Z4800 Encounter for change or removal of nonsurgical wound dressing: Secondary | ICD-10-CM | POA: Diagnosis not present

## 2018-10-31 DIAGNOSIS — I10 Essential (primary) hypertension: Secondary | ICD-10-CM | POA: Diagnosis not present

## 2018-10-31 DIAGNOSIS — T8149XD Infection following a procedure, other surgical site, subsequent encounter: Secondary | ICD-10-CM | POA: Diagnosis not present

## 2018-11-02 DIAGNOSIS — Z4800 Encounter for change or removal of nonsurgical wound dressing: Secondary | ICD-10-CM | POA: Diagnosis not present

## 2018-11-02 DIAGNOSIS — I10 Essential (primary) hypertension: Secondary | ICD-10-CM | POA: Diagnosis not present

## 2018-11-02 DIAGNOSIS — T8149XD Infection following a procedure, other surgical site, subsequent encounter: Secondary | ICD-10-CM | POA: Diagnosis not present

## 2018-11-02 DIAGNOSIS — Z87891 Personal history of nicotine dependence: Secondary | ICD-10-CM | POA: Diagnosis not present

## 2018-11-02 DIAGNOSIS — C4372 Malignant melanoma of left lower limb, including hip: Secondary | ICD-10-CM | POA: Diagnosis not present

## 2018-11-03 ENCOUNTER — Encounter: Payer: Self-pay | Admitting: Podiatry

## 2018-11-03 ENCOUNTER — Ambulatory Visit (INDEPENDENT_AMBULATORY_CARE_PROVIDER_SITE_OTHER): Payer: Medicare Other | Admitting: Podiatry

## 2018-11-03 ENCOUNTER — Other Ambulatory Visit: Payer: Self-pay

## 2018-11-03 ENCOUNTER — Ambulatory Visit (INDEPENDENT_AMBULATORY_CARE_PROVIDER_SITE_OTHER): Payer: Medicare Other

## 2018-11-03 VITALS — Temp 98.4°F | Resp 16

## 2018-11-03 DIAGNOSIS — M2011 Hallux valgus (acquired), right foot: Secondary | ICD-10-CM

## 2018-11-03 DIAGNOSIS — Q828 Other specified congenital malformations of skin: Secondary | ICD-10-CM

## 2018-11-03 DIAGNOSIS — G629 Polyneuropathy, unspecified: Secondary | ICD-10-CM

## 2018-11-04 DIAGNOSIS — S301XXD Contusion of abdominal wall, subsequent encounter: Secondary | ICD-10-CM | POA: Diagnosis not present

## 2018-11-04 DIAGNOSIS — Z48817 Encounter for surgical aftercare following surgery on the skin and subcutaneous tissue: Secondary | ICD-10-CM | POA: Diagnosis not present

## 2018-11-04 DIAGNOSIS — C4372 Malignant melanoma of left lower limb, including hip: Secondary | ICD-10-CM | POA: Diagnosis not present

## 2018-11-07 DIAGNOSIS — I10 Essential (primary) hypertension: Secondary | ICD-10-CM | POA: Diagnosis not present

## 2018-11-07 DIAGNOSIS — C4372 Malignant melanoma of left lower limb, including hip: Secondary | ICD-10-CM | POA: Diagnosis not present

## 2018-11-07 DIAGNOSIS — T8149XD Infection following a procedure, other surgical site, subsequent encounter: Secondary | ICD-10-CM | POA: Diagnosis not present

## 2018-11-07 DIAGNOSIS — Z4800 Encounter for change or removal of nonsurgical wound dressing: Secondary | ICD-10-CM | POA: Diagnosis not present

## 2018-11-07 DIAGNOSIS — Z87891 Personal history of nicotine dependence: Secondary | ICD-10-CM | POA: Diagnosis not present

## 2018-11-10 DIAGNOSIS — C4372 Malignant melanoma of left lower limb, including hip: Secondary | ICD-10-CM | POA: Diagnosis not present

## 2018-11-10 DIAGNOSIS — T8149XD Infection following a procedure, other surgical site, subsequent encounter: Secondary | ICD-10-CM | POA: Diagnosis not present

## 2018-11-10 DIAGNOSIS — Z4800 Encounter for change or removal of nonsurgical wound dressing: Secondary | ICD-10-CM | POA: Diagnosis not present

## 2018-11-10 DIAGNOSIS — I10 Essential (primary) hypertension: Secondary | ICD-10-CM | POA: Diagnosis not present

## 2018-11-10 DIAGNOSIS — Z87891 Personal history of nicotine dependence: Secondary | ICD-10-CM | POA: Diagnosis not present

## 2018-11-13 NOTE — Progress Notes (Signed)
Subjective: 78 year old female presents the office today requesting callus trimmed to both of her feet.  She states that they are causing pain and pressure.  She said that swelling to her right foot and not able to wear her shoe.  The bunion surgery site is been doing well without any significant swelling and no pain to this area.  The wound in the left ankle from the melanoma excision is been healing well. Denies any systemic complaints such as fevers, chills, nausea, vomiting. No acute changes since last appointment, and no other complaints at this time.   Objective: AAO x3, NAD Hyperkeratotic lesions present bilateral submetatarsal area right fifth metatarsal base.  Upon debridement there is no ongoing ulceration drainage or any signs of infection. The incision from the prior surgery is well-healed. Some mild swelling to the right foot and this is another areas of the foot other than the bone.  Mostly the dorsal midfoot.  Significant arthritic changes are present. Wound is almost healed on the left anterior ankle.  No signs of infection. No pain with calf compression, swelling, warmth, erythema  Assessment: Symptomatic calluses bilaterally with right foot swelling  Plan: -All treatment options discussed with the patient including all alternatives, risks, complications.  -We will defer to plastic surgery for management of the wound in the left ankle -Debrided hyperkeratotic lesions x3 without any complications or bleeding -Dispensed compression anklet.  Discussion of pain wearing stiffer soled shoe.  Graphite insert. -Patient encouraged to call the office with any questions, concerns, change in symptoms.   Trula Slade DPM

## 2018-11-15 DIAGNOSIS — Z4800 Encounter for change or removal of nonsurgical wound dressing: Secondary | ICD-10-CM | POA: Diagnosis not present

## 2018-11-15 DIAGNOSIS — I10 Essential (primary) hypertension: Secondary | ICD-10-CM | POA: Diagnosis not present

## 2018-11-15 DIAGNOSIS — Z87891 Personal history of nicotine dependence: Secondary | ICD-10-CM | POA: Diagnosis not present

## 2018-11-15 DIAGNOSIS — C4372 Malignant melanoma of left lower limb, including hip: Secondary | ICD-10-CM | POA: Diagnosis not present

## 2018-11-15 DIAGNOSIS — T8149XD Infection following a procedure, other surgical site, subsequent encounter: Secondary | ICD-10-CM | POA: Diagnosis not present

## 2018-11-24 ENCOUNTER — Telehealth: Payer: Self-pay

## 2018-11-24 ENCOUNTER — Encounter: Payer: Self-pay | Admitting: Family Medicine

## 2018-11-24 DIAGNOSIS — H9193 Unspecified hearing loss, bilateral: Secondary | ICD-10-CM

## 2018-11-24 NOTE — Telephone Encounter (Signed)
Copied from [place] [id number]. Topic: Referral - Request for Referral >> Nov 24, 2018  2:20 PM Rainey Pines A wrote: Has patient seen PCP for this complaint? Yes *If NO, is insurance requiring patient see PCP for this issue before PCP can refer them? Referral for which specialty: Audiology Preferred provider/office: Dr .Natividad Brood phone [phone] fax [phone] Reason for referral: Hearing

## 2018-11-28 ENCOUNTER — Other Ambulatory Visit: Payer: Self-pay

## 2018-11-28 ENCOUNTER — Ambulatory Visit: Payer: Medicare Other | Admitting: Obstetrics & Gynecology

## 2018-11-28 ENCOUNTER — Ambulatory Visit (INDEPENDENT_AMBULATORY_CARE_PROVIDER_SITE_OTHER): Payer: Medicare Other | Admitting: Obstetrics & Gynecology

## 2018-11-28 ENCOUNTER — Encounter: Payer: Self-pay | Admitting: Obstetrics & Gynecology

## 2018-11-28 VITALS — BP 132/79 | HR 68 | Ht 61.0 in | Wt 164.0 lb

## 2018-11-28 DIAGNOSIS — Z960 Presence of urogenital implants: Secondary | ICD-10-CM

## 2018-11-28 DIAGNOSIS — N814 Uterovaginal prolapse, unspecified: Secondary | ICD-10-CM | POA: Diagnosis not present

## 2018-11-28 NOTE — Progress Notes (Signed)
  HPI Pt presents today to have pessary removed and cleaned.  She denies problems and reports that her pessary is working very well. She denies any leakage of urine since the pessary was placed. She report several hospitalizations at [facility] since her last visit. She was dx'd with melanoma and is s/p bx and grafts and reports that the cancer is clear at present.  She denies further complaints.    Review of Systems       Objective:   Physical Exam BP 132/79   Pulse 68   Ht 5\' 1"  (1.549 m)   Wt 164 lb 0.6 oz (74.4 kg)   BMI 31.00 kg/m   CONSTITUTIONAL: Well-developed, well-nourished female in no acute distress.  HENT:  Normocephalic, atraumatic EYES: Conjunctivae and EOM are normal. No scleral icterus.  NECK: Normal range of motion SKIN: Skin is warm and dry. No rash noted. Not diaphoretic.No pallor. [place]: Alert and oriented to person, place, and time. Normal coordination.  GYN: Pessary removed and cleaned; vaginal mucosa looks healty and with no excoriations or breakdown     Assessment:     Pessary check  Pelvic organ prolapse Urinary incontinence- improved since pessary.      Plan:     F/u in 3 months to have pessary removed and cleaned F/u sooner prn   Dalonte Hardage L. Harraway-Smith, M.D., Cherlynn June

## 2018-12-01 ENCOUNTER — Other Ambulatory Visit: Payer: Self-pay

## 2018-12-01 ENCOUNTER — Encounter: Payer: Self-pay | Admitting: Podiatry

## 2018-12-01 ENCOUNTER — Ambulatory Visit (INDEPENDENT_AMBULATORY_CARE_PROVIDER_SITE_OTHER): Payer: Medicare Other | Admitting: Podiatry

## 2018-12-01 DIAGNOSIS — Q828 Other specified congenital malformations of skin: Secondary | ICD-10-CM

## 2018-12-01 DIAGNOSIS — M79674 Pain in right toe(s): Secondary | ICD-10-CM

## 2018-12-01 DIAGNOSIS — M79671 Pain in right foot: Secondary | ICD-10-CM | POA: Diagnosis not present

## 2018-12-01 DIAGNOSIS — B351 Tinea unguium: Secondary | ICD-10-CM | POA: Diagnosis not present

## 2018-12-01 DIAGNOSIS — M6740 Ganglion, unspecified site: Secondary | ICD-10-CM

## 2018-12-01 DIAGNOSIS — M79675 Pain in left toe(s): Secondary | ICD-10-CM

## 2018-12-12 NOTE — Progress Notes (Signed)
Subjective: 78 year old female presents the office today requesting callus trimmed to both of her feet as well as for nail trimming except for her big toes.  Causing discomfort.  Denies any redness or drainage of the nails, toenail sites.  She also states that she did have some swelling on the top of her foot but she denies any recent injury or trauma.  Uses the pain started been doing great.  She states the skin graft is been feeling well on the left ankle.  She is been keeping bacitracin on this.  She still has a follow-up with plastic surgery. Denies any systemic complaints such as fevers, chills, nausea, vomiting. No acute changes since last appointment, and no other complaints at this time.   Objective: AAO x3, NAD Hyperkeratotic lesions present bilateral submetatarsal area right fifth metatarsal base.  Upon debridement there is no ongoing ulceration drainage or any signs of infection. Nails appear to be hypertrophic, dystrophic, discolored toenails 2 through 5 bilaterally are causing discomfort but there is no surrounding redness or drainage or any signs of infection. There is swelling to the dorsal aspect of the right foot along Lisfranc joint and there is a mobile fluid-filled soft tissue mass present.  This appears to be a ganglion cyst.  This is where she discomfort with shoes.  No other areas of pinpoint tenderness. Wound is almost healed on the left anterior ankle.  No signs of infection. No pain with calf compression, swelling, warmth, erythema  Assessment: Symptomatic calluses bilaterally with right foot swelling  Plan: -All treatment options discussed with the patient including all alternatives, risks, complications.  -We will defer to plastic surgery for management of the wound in the left ankle -Debrided hyperkeratotic lesions x3 without any complications or bleeding -Nails debrided x8 without any complications or bleeding -Steroid injection performed beginning incision is also  able to aspirate a small amount of clear, jellylike fluid after the injection.  A total of 1 cc Kenalog 10, 0.5 cc of Marcaine plain, 0.5 cc of lidocaine plain was infiltrated directly into the soft tissue mass but complications.  Afterwards unable to express clear, jellylike fluid consistent with ganglion cyst.  She tolerated well.  Compression anklet applied. -Dispensed compression anklet.  Discussion of pain wearing stiffer soled shoe.  Graphite insert. -Patient encouraged to call the office with any questions, concerns, change in symptoms.   Trula Slade DPM

## 2018-12-26 DIAGNOSIS — H903 Sensorineural hearing loss, bilateral: Secondary | ICD-10-CM | POA: Diagnosis not present

## 2018-12-30 ENCOUNTER — Ambulatory Visit (INDEPENDENT_AMBULATORY_CARE_PROVIDER_SITE_OTHER): Payer: Medicare Other | Admitting: Podiatry

## 2018-12-30 ENCOUNTER — Other Ambulatory Visit: Payer: Self-pay

## 2018-12-30 ENCOUNTER — Encounter: Payer: Self-pay | Admitting: Podiatry

## 2018-12-30 VITALS — Temp 97.2°F

## 2018-12-30 DIAGNOSIS — Q828 Other specified congenital malformations of skin: Secondary | ICD-10-CM

## 2018-12-30 NOTE — Progress Notes (Signed)
Subjective: 78 year old female presents the office today requesting callus trimmed mostly to her left foot.  They are causing quite a bit of pain.  She has had chronic foot pain for quite some time.  She did go to the [facility] and orthotics.  However they are very expensive if she do not get them but made her feet feel better. Denies any systemic complaints such as fevers, chills, nausea, vomiting. No acute changes since last appointment, and no other complaints at this time.   Objective: AAO x3, NAD Hyperkeratotic lesions present left foot metatarsal area as well as fifth metatarsal base.  No ongoing ulceration drainage or signs of infection.  There is no significant callus formation right side today.  The wound on the anterior ankle on the left side is healed a scab is formed on the periphery.  No open lesions.  Cavus foot type.  Soft tissue mass likely ganglion cyst on the dorsal aspect of right foot which is reoccurred. No pain with calf compression, swelling, warmth, erythema  Assessment: Symptomatic calluses bilaterally chronic foot pain  Plan: -All treatment options discussed with the patient including all alternatives, risks, complications.  -Debrided hyperkeratotic lesions x2 without any complications or bleeding -We will have her follow-up with [facility] for inserts -Patient encouraged to call the office with any questions, concerns, change in symptoms.   Trula Slade DPM

## 2019-01-10 ENCOUNTER — Other Ambulatory Visit: Payer: Self-pay

## 2019-01-10 ENCOUNTER — Ambulatory Visit (INDEPENDENT_AMBULATORY_CARE_PROVIDER_SITE_OTHER): Payer: Self-pay | Admitting: Orthotics

## 2019-01-10 DIAGNOSIS — M2011 Hallux valgus (acquired), right foot: Secondary | ICD-10-CM

## 2019-01-10 DIAGNOSIS — M722 Plantar fascial fibromatosis: Secondary | ICD-10-CM

## 2019-01-10 DIAGNOSIS — Q828 Other specified congenital malformations of skin: Secondary | ICD-10-CM

## 2019-01-10 DIAGNOSIS — S81802D Unspecified open wound, left lower leg, subsequent encounter: Secondary | ICD-10-CM | POA: Diagnosis not present

## 2019-01-10 DIAGNOSIS — G629 Polyneuropathy, unspecified: Secondary | ICD-10-CM

## 2019-01-10 NOTE — Progress Notes (Signed)
Patient was seen today for offloading painful plantar fibromas/keratomas.  Area of concerned was marked and patient was scanned/cast to offload the keratoma/fibroma.  A LW accomodative device will be fabricated for the patient with appropriate offloads. Patient also has high arch that needs to be accomodated.

## 2019-01-23 ENCOUNTER — Ambulatory Visit (INDEPENDENT_AMBULATORY_CARE_PROVIDER_SITE_OTHER): Payer: Medicare Other | Admitting: Podiatry

## 2019-01-23 ENCOUNTER — Other Ambulatory Visit: Payer: Self-pay | Admitting: Family Medicine

## 2019-01-23 ENCOUNTER — Encounter: Payer: Self-pay | Admitting: Podiatry

## 2019-01-23 ENCOUNTER — Other Ambulatory Visit: Payer: Self-pay

## 2019-01-23 VITALS — Temp 97.8°F

## 2019-01-23 DIAGNOSIS — Q828 Other specified congenital malformations of skin: Secondary | ICD-10-CM

## 2019-01-23 DIAGNOSIS — G629 Polyneuropathy, unspecified: Secondary | ICD-10-CM | POA: Diagnosis not present

## 2019-01-23 DIAGNOSIS — I10 Essential (primary) hypertension: Secondary | ICD-10-CM

## 2019-01-23 MED ORDER — NONFORMULARY OR COMPOUNDED ITEM: 1.0000 g | Freq: Four times a day (QID) | 2 refills | Status: DC

## 2019-01-24 ENCOUNTER — Telehealth: Payer: Self-pay | Admitting: *Deleted

## 2019-01-24 MED ORDER — NONFORMULARY OR COMPOUNDED ITEM: 11 refills | Status: DC

## 2019-01-24 NOTE — Telephone Encounter (Signed)
[facility] faxed note stating the wrong demographics had been sent with pt's rx. Faxed correct pt's demographics with rx to [facility].

## 2019-01-25 NOTE — Progress Notes (Signed)
Subjective: 78 year old female presents the office today requesting callus trimmed mostly to her left foot. The right foot is doing well.  She states that she can only wear sneakers no other shoes because of the pain to her foot.  She is scheduled to get measured for orthotics.  Denies any open sores she is having chronic foot pain which is been ongoing for quite some time.Denies any systemic complaints such as fevers, chills, nausea, vomiting. No acute changes since last appointment, and no other complaints at this time.   Objective: AAO x3, NAD Hyperkeratotic lesions present left foot metatarsal area as well as fifth metatarsal base.  No ongoing ulceration drainage or signs of infection.  There is no significant callus formation right side today.  Multiple digital findings are present but in the point present.  Arthritic changes present in the dorsal midfoot on the right side. No pain with calf compression, swelling, warmth, erythema  Assessment: Symptomatic calluses bilaterally chronic foot pain  Plan: -All treatment options discussed with the patient including all alternatives, risks, complications.  -Debrided hyperkeratotic lesions x2 without any complications or bleeding -We will have her follow-up with [facility] Channel for inserts -Patient encouraged to call the office with any questions, concerns, change in symptoms.   Trula Slade DPM

## 2019-01-26 ENCOUNTER — Other Ambulatory Visit: Payer: Self-pay | Admitting: Family Medicine

## 2019-01-30 ENCOUNTER — Other Ambulatory Visit: Payer: Self-pay | Admitting: Family Medicine

## 2019-02-02 ENCOUNTER — Encounter: Payer: Medicare Other | Admitting: Orthotics

## 2019-02-10 DIAGNOSIS — C4372 Malignant melanoma of left lower limb, including hip: Secondary | ICD-10-CM | POA: Diagnosis not present

## 2019-02-17 DIAGNOSIS — Z23 Encounter for immunization: Secondary | ICD-10-CM | POA: Diagnosis not present

## 2019-02-20 ENCOUNTER — Other Ambulatory Visit: Payer: Self-pay

## 2019-02-20 ENCOUNTER — Ambulatory Visit: Payer: Medicare Other | Admitting: Orthotics

## 2019-02-20 ENCOUNTER — Encounter: Payer: Self-pay | Admitting: Podiatry

## 2019-02-20 ENCOUNTER — Ambulatory Visit (INDEPENDENT_AMBULATORY_CARE_PROVIDER_SITE_OTHER): Payer: Medicare Other | Admitting: Podiatry

## 2019-02-20 DIAGNOSIS — M2011 Hallux valgus (acquired), right foot: Secondary | ICD-10-CM

## 2019-02-20 DIAGNOSIS — G629 Polyneuropathy, unspecified: Secondary | ICD-10-CM

## 2019-02-20 DIAGNOSIS — Q828 Other specified congenital malformations of skin: Secondary | ICD-10-CM | POA: Diagnosis not present

## 2019-02-20 NOTE — Progress Notes (Signed)
Subjective: 78 year old female presents the office today requesting callus trimmed mostly to her left foot. The right foot is doing well. Denies any redness, drainage, swelling. She presents today to also pick up orthotics. Denies any systemic complaints such as fevers, chills, nausea, vomiting. No acute changes since last appointment, and no other complaints at this time.   Objective: AAO x3, NAD Hyperkeratotic lesions present left foot metatarsal area as well as fifth metatarsal base.  No ongoing ulceration drainage or signs of infection.  There is no significant callus formation right side today.  Multiple digital findings are present but in the point present.  Arthritic changes present in the dorsal midfoot on the right side. No pain with calf compression, swelling, warmth, erythema  Assessment: Symptomatic calluses bilaterally chronic foot pain  Plan: -All treatment options discussed with the patient including all alternatives, risks, complications.  -Debrided hyperkeratotic lesions x2 without any complications or bleeding -Orthotics dispensed today today by [facility] Channel.  -Patient encouraged to call the office with any questions, concerns, change in symptoms.   Trula Slade DPM

## 2019-02-20 NOTE — Progress Notes (Signed)
Patient came in today to pick up custom made foot orthotics.  The goals were accomplished and the patient reported no dissatisfaction with said orthotics.  Patient was advised of breakin period and how to report any issues. 

## 2019-02-28 ENCOUNTER — Other Ambulatory Visit (HOSPITAL_BASED_OUTPATIENT_CLINIC_OR_DEPARTMENT_OTHER): Payer: Self-pay | Admitting: Obstetrics & Gynecology

## 2019-02-28 ENCOUNTER — Other Ambulatory Visit (HOSPITAL_BASED_OUTPATIENT_CLINIC_OR_DEPARTMENT_OTHER): Payer: Self-pay | Admitting: Family Medicine

## 2019-02-28 DIAGNOSIS — Z1231 Encounter for screening mammogram for malignant neoplasm of breast: Secondary | ICD-10-CM

## 2019-03-04 NOTE — Progress Notes (Deleted)
[place] at [facility] [address] [place], [place] [address] [phone]  Date:  03/06/2019   Name:  Margaret [name]   DOB:  1940-11-08   MRN:  [id number]  PCP:  Darreld Mclean, MD    Chief Complaint: No chief complaint on file.   History of Present Illness:  Margaret [name] is a 78 y.o. very pleasant female patient who presents with the following:  ***  Patient Active Problem List   Diagnosis Date Noted  . Encounter for postoperative examination after surgery for malignant neoplasm 09/30/2018  . Malignant melanoma of left foot ([place]) 08/15/2018  . Plantar fasciitis of right foot 02/03/2017  . Porokeratosis 02/03/2017  . Acute pancreatitis due to calculus of common bile duct 09/17/2016  . Acute gallstone pancreatitis   . Calculus of gallbladder with acute cholecystitis and obstruction   . LFT elevation   . Stress fracture 01/01/2016  . Insomnia 12/16/2015  . Peripheral neuropathy 10/18/2015  . Renal insufficiency 03/06/2015  . HTN (hypertension) 03/06/2015  . Osteopenia 03/06/2015  . GERD (gastroesophageal reflux disease) 03/06/2015  . Vaginal pessary present 03/06/2015    Past Medical History:  Diagnosis Date  . Arthritis   . History of healed stress fracture 2013   bilateral feet  . Hx of phlebitis    reports remote hx of "superfical blood clots" never anticoagulated  . Hypertension   . Osteopenia     Past Surgical History:  Procedure Laterality Date  . CHOLECYSTECTOMY N/A 09/19/2016   Procedure: LAPAROSCOPIC CHOLECYSTECTOMY POSSIBLE INTRAOPERATIVE CHOLANGIOGRAM;  Surgeon: Rolm Bookbinder, MD;  Location: [facility];  Service: General;  Laterality: N/A;  . ERCP N/A 09/18/2016   Procedure: ENDOSCOPIC RETROGRADE CHOLANGIOPANCREATOGRAPHY (ERCP);  Surgeon: Doran Stabler, MD;  Location: [facility] ENDOSCOPY;  Service: Endoscopy;  Laterality: N/A;  . FOOT SURGERY     patient reports four foot surgeries  . FOOT  SURGERY Left    bunion, hammer toe surgery  . FOOT SURGERY Right    shaved bunion, hammer toe correction  . KNEE ARTHROSCOPY Right 1997  . REPLACEMENT TOTAL KNEE Left   . 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 RETINAL DETACHMENT SURGERY    . RETINAL DETACHMENT SURGERY Left 2008  . THYROIDECTOMY, PARTIAL Right   . THYROIDECTOMY, PARTIAL  2005    Social History   Tobacco Use  . Smoking status: Former Smoker    Quit date: 06/02/1979    Years since quitting: 39.7  . Smokeless tobacco: Never Used  Substance Use Topics  . Alcohol use: Yes    Alcohol/week: 1.0 standard drinks    Types: 1 Glasses of wine per week    Comment: Occ  . Drug use: No    Family History  Problem Relation Age of Onset  . Heart disease Mother   . Heart attack Mother   . Heart disease Father   . Diabetes Maternal Grandmother   . Cancer Paternal Grandfather        stomach    Allergies  Allergen Reactions  . Keflex [Cephalexin] Diarrhea  . Percocet [Oxycodone-Acetaminophen] Other (See Comments)    Pt. reports that the medication causes her to be light headed and pass out.    Medication list has been reviewed and updated.  Current Outpatient Medications on File Prior to Visit  Medication Sig Dispense Refill  . acetaminophen (TYLENOL) 500 MG tablet Take by mouth.    Marland Kitchen acetaminophen-codeine (TYLENOL #3)  300-30 MG tablet Take 1 tablet by mouth every 4 (four) hours as needed for moderate pain. 25 tablet 0  . Calcium Carbonate-Vitamin D (CALCIUM HIGH POTENCY/VITAMIN D) 600-200 MG-UNIT TABS Take by mouth.    . celecoxib (CELEBREX) 100 MG capsule TAKE 1 CAPSULE(100 MG) BY MOUTH TWICE DAILY AS NEEDED 60 capsule 3  . Cranberry (THERACRAN HP) 180 MG CAPS Take by mouth.    . docusate sodium (COLACE) 100 MG capsule Take by mouth.    Margaret [name] Sodium (STOOL SOFTENER LAXATIVE PO) Take 1 tablet by mouth daily.    . hydrochlorothiazide (MICROZIDE) 12.5 MG capsule TAKE 1 CAPSULE(12.5 MG) BY MOUTH DAILY 30 capsule 6   . ketoconazole (NIZORAL) 2 % cream     . LORazepam (ATIVAN) 2 MG tablet TAKE 1-1&1/2 TABLETS BY MOUTH EVERY NIGHT AT BEDTIME AS NEEDED FOR SLEEP MAY TAKE 1 TABLET DURING DAY IF NEEDED 70 tablet 2  . metoprolol succinate (TOPROL-XL) 25 MG 24 hr tablet TAKE 1 TABLET(25 MG) BY MOUTH DAILY 90 tablet 1  . Multiple Vitamins-Minerals (WOMENS DAILY FORMULA) TABS Take 1 tablet by mouth daily.    . NONFORMULARY OR COMPOUNDED ITEM [facility]:  Peripheral Neuropathy - Bupivacaine 1%, Doxepin 3%, Gabapentin 6%, Pentoxifylline 3%, Topiramate 1%. Apply 1-2 grams to affected area 3-4 times daily. 100 each 11  . omeprazole (PRILOSEC) 20 MG capsule TAKE 1 CAPSULE BY MOUTH EVERY DAY AS NEEDED 90 capsule 3  . oxyCODONE-acetaminophen (PERCOCET/ROXICET) 5-325 MG tablet TK 1 T PO Q 4 H PRN FOR UP TO 5 DAYS    . pregabalin (LYRICA) 75 MG capsule TAKE 1 CAPSULE(75 MG) BY MOUTH TWICE DAILY 60 capsule 5  . promethazine (PHENERGAN) 25 MG tablet Take 1 tablet (25 mg total) by mouth every 8 (eight) hours as needed for nausea or vomiting. 20 tablet 0   No current facility-administered medications on file prior to visit.     Review of Systems:  ***  Physical Examination: There were no vitals filed for this visit. There were no vitals filed for this visit. There is no height or weight on file to calculate BMI. Ideal Body Weight:    ***  Assessment and Plan: ***  Signed Lamar Blinks, MD

## 2019-03-06 ENCOUNTER — Ambulatory Visit: Payer: Medicare Other | Admitting: Family Medicine

## 2019-03-10 ENCOUNTER — Telehealth: Payer: Self-pay

## 2019-03-10 NOTE — Telephone Encounter (Signed)
Copied from [place] [id number]. Topic: General - Other >> Mar 09, 2019  2:58 PM Margaret [name] wrote: Reason for CRM: Patient called to request [name] call back from Dr Serita Grit nurse having issues with her left knee and have questions about blood work. Ph# [phone]

## 2019-03-10 NOTE — Telephone Encounter (Signed)
Called patient, explained to her what your advice was. I have scheduled an appointment with provider next week.

## 2019-03-10 NOTE — Telephone Encounter (Signed)
Patient has real bad arthritis in left knee.Her knee was peplaced 4 to 5 years ago and was told she might end up needing another one? She would like to know if you do cortisone injections?   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 She was told it was all taken out whoever she sees her oncologist through [facility] and he wants her to come in every 4 weeks. Could you recommend and place a referral for another oncologist to her to see?    Also would like her cholesterol checked in office. If you do the cortisone injections we could just bring her in and have labs drawn at the same time. Please advise on what I should do for the patient.  Thank you!

## 2019-03-10 NOTE — Telephone Encounter (Signed)
Thanks Mel Almond Please let her know that I do NOT do knee injections like what she is requesting.  I am glad to help her with her other concerns and can refer her to sports med/ ortho for her knees if she likes?  Thank you JC

## 2019-03-15 ENCOUNTER — Other Ambulatory Visit: Payer: Self-pay

## 2019-03-15 NOTE — Progress Notes (Addendum)
[place] at [facility] [place], [place], [place] [address] [phone]  Date:  03/16/2019   Name:  Margaret [name]   DOB:  Nov 06, 1940   MRN:  [id number]  PCP:  Darreld Mclean, MD    Chief Complaint: [place] (melanoma-oncologist referral) and Lab Work   History of Present Illness:  Margaret [name] is a 78 y.o. very pleasant female patient who presents with the following:  Patient with history of melanoma, gallstone pancreatitis status post cholecystectomy 2018, neuropathy, hypertension, osteopenia, renal insufficiency Her husband Margaret [name] also died in 08/21/17  Last seen by myself in May of this year-at that time she had suffered a complication from her groin sentinel node biopsy for melanoma, and ended up being admitted at [facility] for couple of days She is recovered well  Here today with concern of catching up on lab work, and also knee pain  She saw her surgeon at [facility] last month, to 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  She was doing well, asked to follow-up with dermatology in 6 months Her oncologist is at [facility]- she would like to get a local oncologist, will refer to [facility] oncology program Her orthopedist is Dr Case at [facility] - she will call to schedule with him regarding her knees  Flu shot; done  Tetanus:  mammo appears to be due- she has it set up already shringrix done already  Her neuropathy in her feet and knee pain makes it hard for her to walk  Her left knee is s/p total joint and does fine The right knee is original and is panful to her  She also is wearing a postop shoe on her right foot due to foot surgery.  She is having a bit of difficulty walking, but states this is a chronic issue due to her orthopedic problems and does not seem to be neurologic  CMP, CBC performed in May She is fasting and would like to do routine labs  today including lipids  Patient Active Problem List   Diagnosis Date Noted  . Encounter for postoperative examination after surgery for malignant neoplasm 09/30/2018  . Malignant melanoma of left foot ([place]) 08/15/2018  . Plantar fasciitis of right foot 02/03/2017  . Porokeratosis 02/03/2017  . Acute pancreatitis due to calculus of common bile duct 09/17/2016  . Acute gallstone pancreatitis   . Calculus of gallbladder with acute cholecystitis and obstruction   . LFT elevation   . Stress fracture 01/01/2016  . Insomnia 12/16/2015  . Peripheral neuropathy 10/18/2015  . Renal insufficiency 03/06/2015  . HTN (hypertension) 03/06/2015  . Osteopenia 03/06/2015  . GERD (gastroesophageal reflux disease) 03/06/2015  . Vaginal pessary present 03/06/2015    Past Medical History:  Diagnosis Date  . Arthritis   . History of healed stress fracture 2013   bilateral feet  . Hx of phlebitis    reports remote hx of "superfical blood clots" never anticoagulated  . Hypertension   . Osteopenia     Past Surgical History:  Procedure Laterality Date  . CHOLECYSTECTOMY N/A 09/19/2016   Procedure: LAPAROSCOPIC CHOLECYSTECTOMY POSSIBLE INTRAOPERATIVE CHOLANGIOGRAM;  Surgeon: Rolm Bookbinder, MD;  Location: [facility];  Service: General;  Laterality: N/A;  . ERCP N/A 09/18/2016   Procedure: ENDOSCOPIC RETROGRADE CHOLANGIOPANCREATOGRAPHY (ERCP);  Surgeon: Doran Stabler, MD;  Location: [facility] ENDOSCOPY;  Service: Endoscopy;  Laterality: N/A;  .  FOOT SURGERY     patient reports four foot surgeries  . FOOT SURGERY Left    bunion, hammer toe surgery  . FOOT SURGERY Right    shaved bunion, hammer toe correction  . KNEE ARTHROSCOPY Right 1997  . REPLACEMENT TOTAL KNEE Left   . 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 RETINAL DETACHMENT SURGERY    . RETINAL DETACHMENT SURGERY Left 2008  . THYROIDECTOMY, PARTIAL Right   . THYROIDECTOMY, PARTIAL  2005    Social History   Tobacco Use  . Smoking status:  Former Smoker    Quit date: 06/02/1979    Years since quitting: 39.8  . Smokeless tobacco: Never Used  Substance Use Topics  . Alcohol use: Yes    Alcohol/week: 1.0 standard drinks    Types: 1 Glasses of wine per week    Comment: Occ  . Drug use: No    Family History  Problem Relation Age of Onset  . Heart disease Mother   . Heart attack Mother   . Heart disease Father   . Diabetes Maternal Grandmother   . Cancer Paternal Grandfather        stomach    Allergies  Allergen Reactions  . Keflex [Cephalexin] Diarrhea  . Percocet [Oxycodone-Acetaminophen] Other (See Comments)    Pt. reports that the medication causes her to be light headed and pass out.    Medication list has been reviewed and updated.  Current Outpatient Medications on File Prior to Visit  Medication Sig Dispense Refill  . acetaminophen (TYLENOL) 500 MG tablet Take by mouth.    Marland Kitchen acetaminophen-codeine (TYLENOL #3) 300-30 MG tablet Take 1 tablet by mouth every 4 (four) hours as needed for moderate pain. 25 tablet 0  . celecoxib (CELEBREX) 100 MG capsule TAKE 1 CAPSULE(100 MG) BY MOUTH TWICE DAILY AS NEEDED 60 capsule 3  . Cranberry (THERACRAN HP) 180 MG CAPS Take by mouth.    . docusate sodium (COLACE) 100 MG capsule Take by mouth.    Mariane Baumgarten Sodium (STOOL SOFTENER LAXATIVE PO) Take 1 tablet by mouth daily.    . hydrochlorothiazide (MICROZIDE) 12.5 MG capsule TAKE 1 CAPSULE(12.5 MG) BY MOUTH DAILY 30 capsule 6  . ketoconazole (NIZORAL) 2 % cream     . LORazepam (ATIVAN) 2 MG tablet TAKE 1-1&1/2 TABLETS BY MOUTH EVERY NIGHT AT BEDTIME AS NEEDED FOR SLEEP MAY TAKE 1 TABLET DURING DAY IF NEEDED 70 tablet 2  . metoprolol succinate (TOPROL-XL) 25 MG 24 hr tablet TAKE 1 TABLET(25 MG) BY MOUTH DAILY 90 tablet 1  . Multiple Vitamins-Minerals (WOMENS DAILY FORMULA) TABS Take 1 tablet by mouth daily.    . NONFORMULARY OR COMPOUNDED ITEM [facility]:  Peripheral Neuropathy - Bupivacaine 1%, Doxepin 3%, Gabapentin  6%, Pentoxifylline 3%, Topiramate 1%. Apply 1-2 grams to affected area 3-4 times daily. 100 each 11  . omeprazole (PRILOSEC) 20 MG capsule TAKE 1 CAPSULE BY MOUTH EVERY DAY AS NEEDED 90 capsule 3  . oxyCODONE-acetaminophen (PERCOCET/ROXICET) 5-325 MG tablet TK 1 T PO Q 4 H PRN FOR UP TO 5 DAYS    . promethazine (PHENERGAN) 25 MG tablet Take 1 tablet (25 mg total) by mouth every 8 (eight) hours as needed for nausea or vomiting. 20 tablet 0  . Calcium Carbonate-Vitamin D (CALCIUM HIGH POTENCY/VITAMIN D) 600-200 MG-UNIT TABS Take by mouth.     No current facility-administered medications on file prior to visit.     Review of Systems:  As per HPI- otherwise negative.  Physical Examination: Vitals:   03/16/19 1109  BP: 136/88  Pulse: 65  Resp: 16  Temp: (!) 97.5 F (36.4 C)  SpO2: 96%   Vitals:   03/16/19 1109  Weight: 164 lb (74.4 kg)  Height: 5\' 1"  (1.549 m)   Body mass index is 30.99 kg/m. Ideal Body Weight: Weight in (lb) to have BMI = 25: 132  GEN: WDWN, NAD, Non-toxic, A & O x 3, overweight, appears well and her normal self HEENT: Atraumatic, Normocephalic. Neck supple. No masses, No LAD.  Wearing hearing aids bilaterally, PEERL Ears and Nose: No external deformity. CV: RRR, No M/G/R. No JVD. No thrill. No extra heart sounds. PULM: CTA B, no wheezes, crackles, rhonchi. No retractions. No resp. distress. No accessory muscle use.Marland Kitchen EXTR: No c/c/e NEURO walking with a friend, and was holding her arm for balance.  She is wearing a postop shoe on the right foot.  The left knee status post replacement.  The right knee is grossly thickened consistent with degenerative change, and displays crepitus with range of motion PSYCH: Normally interactive. Conversant. Not depressed or anxious appearing.  Calm demeanor.    Assessment and Plan: Essential hypertension - Plan: CBC, Basic metabolic panel  Malignant melanoma of left foot ([place]) - Plan: Ambulatory referral to Hematology /  Oncology  Other polyneuropathy - Plan: pregabalin (LYRICA) 75 MG capsule, DISCONTINUED: pregabalin (LYRICA) 75 MG capsule  Pre-diabetes - Plan: Hemoglobin A1c  Screening for hyperlipidemia - Plan: Lipid panel  Following up today Referred to hematology at [facility] to monitor her melanoma Refilled her Lyrica-prescribed by her podiatrist, but I am glad to refill for her-for her peripheral neuropathy Will plan further follow- up pending labs. Encouraged her to see orthopedist for her right knee as soon as possible   Signed Lamar Blinks, MD  Received her labs, message to patient  Results for orders placed or performed in visit on 03/16/19  CBC  Result Value Ref Range   WBC 4.8 4.0 - 10.5 K/uL   RBC 4.51 3.87 - 5.11 Mil/uL   Platelets 218.0 150.0 - 400.0 K/uL   Hemoglobin 14.4 12.0 - 15.0 g/dL   HCT 42.6 36.0 - 46.0 %   MCV 94.6 78.0 - 100.0 fl   MCHC 33.9 30.0 - 36.0 g/dL   RDW 14.4 11.5 - [id number] %  Basic metabolic panel  Result Value Ref Range   Sodium 143 135 - 145 mEq/L   Potassium 4.2 3.5 - 5.1 mEq/L   Chloride 103 96 - 112 mEq/L   CO2 30 19 - 32 mEq/L   Glucose, Bld 94 70 - 99 mg/dL   BUN 24 (H) 6 - 23 mg/dL   Creatinine, Ser 1.09 0.40 - 1.20 mg/dL   Calcium 10.2 8.4 - 10.5 mg/dL   GFR 48.47 (L) >60.00 mL/min  Hemoglobin A1c  Result Value Ref Range   Hgb A1c MFr Bld 6.1 4.6 - 6.5 %  Lipid panel  Result Value Ref Range   Cholesterol 226 (H) 0 - 200 mg/dL   Triglycerides 125.0 0.0 - 149.0 mg/dL   HDL 75.30 >39.00 mg/dL   VLDL 25.0 0.0 - 40.0 mg/dL   LDL Cholesterol 125 (H) 0 - 99 mg/dL   Total CHOL/HDL Ratio 3    NonHDL 150.47

## 2019-03-15 NOTE — Patient Instructions (Addendum)
It was very nice to see you again today, will be in touch with your labs ASAP  Please contact your orthopedist Dr Martinique Case and request an appt for your knee.  You might ask if he can also treat your trigger finger   Address: [address], [place], [place] [address] Phone: [phone]  We will set you up with oncology at [facility]

## 2019-03-16 ENCOUNTER — Encounter: Payer: Self-pay | Admitting: Family Medicine

## 2019-03-16 ENCOUNTER — Other Ambulatory Visit: Payer: Self-pay

## 2019-03-16 ENCOUNTER — Ambulatory Visit (INDEPENDENT_AMBULATORY_CARE_PROVIDER_SITE_OTHER): Payer: Medicare Other | Admitting: Family Medicine

## 2019-03-16 VITALS — BP 136/88 | HR 65 | Temp 97.5°F | Resp 16 | Ht 61.0 in | Wt 164.0 lb

## 2019-03-16 DIAGNOSIS — G8929 Other chronic pain: Secondary | ICD-10-CM | POA: Diagnosis not present

## 2019-03-16 DIAGNOSIS — C4372 Malignant melanoma of left lower limb, including hip: Secondary | ICD-10-CM

## 2019-03-16 DIAGNOSIS — M25561 Pain in right knee: Secondary | ICD-10-CM | POA: Diagnosis not present

## 2019-03-16 DIAGNOSIS — G6289 Other specified polyneuropathies: Secondary | ICD-10-CM | POA: Diagnosis not present

## 2019-03-16 DIAGNOSIS — I10 Essential (primary) hypertension: Secondary | ICD-10-CM

## 2019-03-16 DIAGNOSIS — R7303 Prediabetes: Secondary | ICD-10-CM | POA: Diagnosis not present

## 2019-03-16 DIAGNOSIS — Z13220 Encounter for screening for lipoid disorders: Secondary | ICD-10-CM

## 2019-03-16 LAB — LIPID PANEL
Cholesterol: 226 mg/dL — ABNORMAL HIGH (ref 0–200)
HDL: 75.3 mg/dL (ref >39.00)
LDL Cholesterol: 125 mg/dL — ABNORMAL HIGH (ref 0–99)
NonHDL: 150.47
Total CHOL/HDL Ratio: 3
Triglycerides: 125 mg/dL (ref 0.0–149.0)
VLDL: 25 mg/dL (ref 0.0–40.0)

## 2019-03-16 LAB — HEMOGLOBIN A1C: Hgb A1c MFr Bld: 6.1 % (ref 4.6–6.5)

## 2019-03-16 LAB — BASIC METABOLIC PANEL
BUN: 24 mg/dL — ABNORMAL HIGH (ref 6–23)
CO2: 30 mEq/L (ref 19–32)
Calcium: 10.2 mg/dL (ref 8.4–10.5)
Chloride: 103 mEq/L (ref 96–112)
Creatinine, Ser: 1.09 mg/dL (ref 0.40–1.20)
GFR: 48.47 mL/min — ABNORMAL LOW (ref >60.00)
Glucose, Bld: 94 mg/dL (ref 70–99)
Potassium: 4.2 mEq/L (ref 3.5–5.1)
Sodium: 143 mEq/L (ref 135–145)

## 2019-03-16 LAB — CBC
HCT: 42.6 % (ref 36.0–46.0)
Hemoglobin: 14.4 g/dL (ref 12.0–15.0)
MCHC: 33.9 g/dL (ref 30.0–36.0)
MCV: 94.6 fl (ref 78.0–100.0)
Platelets: 218 10*3/uL (ref 150.0–400.0)
RBC: 4.51 Mil/uL (ref 3.87–5.11)
RDW: 14.4 % (ref 11.5–15.5)
WBC: 4.8 10*3/uL (ref 4.0–10.5)

## 2019-03-16 MED ORDER — PREGABALIN 75 MG PO CAPS: ORAL_CAPSULE | ORAL | 3 refills | Status: DC

## 2019-03-20 ENCOUNTER — Ambulatory Visit: Payer: Medicare Other | Admitting: Podiatry

## 2019-03-24 ENCOUNTER — Telehealth: Payer: Self-pay | Admitting: Family Medicine

## 2019-03-24 ENCOUNTER — Encounter: Payer: Self-pay | Admitting: Family Medicine

## 2019-03-24 NOTE — Telephone Encounter (Signed)
-----   Message from Cordelia Poche, RN sent at 03/21/2019  1:42 PM EDT ----- Regarding: RE: Referral Good afternoon Dr [name],  There isn't a role for Medical Oncology with stage IB melanoma, but if you feel she would benefit from the oncologist telling her that, we can schedule.   Just let us know. Have a good afternoon!  [facility] Nurse Navigator  [phone] ----- Message ----- From: Tish Men, MD Sent: 03/21/2019  11:46 AM EDT To: Cordelia Poche, RN Subject: RE: Referral                                   We can see her once if she wants to, but stage IB just needs dermatology evaluation.  Thanks.  [place]  ----- Message ----- From: Cordelia Poche, RN Sent: 03/21/2019  11:42 AM EDT To: Tish Men, MD Subject: Referral                                       This is a patient with melanoma in March. Saw surgery at [facility] and had everything resected - stage IB. Does she need medical oncology? Referral now to [name].  Roselyn Reef

## 2019-03-29 ENCOUNTER — Other Ambulatory Visit: Payer: Self-pay | Admitting: Family Medicine

## 2019-03-29 DIAGNOSIS — K56609 Unspecified intestinal obstruction, unspecified as to partial versus complete obstruction: Secondary | ICD-10-CM | POA: Diagnosis not present

## 2019-03-29 DIAGNOSIS — K460 Unspecified abdominal hernia with obstruction, without gangrene: Secondary | ICD-10-CM | POA: Diagnosis not present

## 2019-03-29 DIAGNOSIS — R9431 Abnormal electrocardiogram [ECG] [EKG]: Secondary | ICD-10-CM | POA: Diagnosis not present

## 2019-03-29 DIAGNOSIS — K219 Gastro-esophageal reflux disease without esophagitis: Secondary | ICD-10-CM | POA: Diagnosis not present

## 2019-03-29 DIAGNOSIS — R1110 Vomiting, unspecified: Secondary | ICD-10-CM | POA: Diagnosis not present

## 2019-03-29 DIAGNOSIS — R1013 Epigastric pain: Secondary | ICD-10-CM | POA: Diagnosis not present

## 2019-03-29 DIAGNOSIS — K56699 Other intestinal obstruction unspecified as to partial versus complete obstruction: Secondary | ICD-10-CM | POA: Diagnosis not present

## 2019-03-29 DIAGNOSIS — K436 Other and unspecified ventral hernia with obstruction, without gangrene: Secondary | ICD-10-CM | POA: Diagnosis not present

## 2019-03-29 DIAGNOSIS — R109 Unspecified abdominal pain: Secondary | ICD-10-CM | POA: Diagnosis not present

## 2019-03-29 DIAGNOSIS — E860 Dehydration: Secondary | ICD-10-CM | POA: Diagnosis present

## 2019-03-29 DIAGNOSIS — K5669 Other partial intestinal obstruction: Secondary | ICD-10-CM | POA: Diagnosis not present

## 2019-03-29 DIAGNOSIS — R112 Nausea with vomiting, unspecified: Secondary | ICD-10-CM | POA: Diagnosis not present

## 2019-03-29 DIAGNOSIS — R079 Chest pain, unspecified: Secondary | ICD-10-CM | POA: Diagnosis not present

## 2019-03-29 DIAGNOSIS — K429 Umbilical hernia without obstruction or gangrene: Secondary | ICD-10-CM | POA: Diagnosis not present

## 2019-03-31 ENCOUNTER — Telehealth: Payer: Self-pay | Admitting: *Deleted

## 2019-03-31 NOTE — Telephone Encounter (Signed)
Ok thank you 

## 2019-03-31 NOTE — Telephone Encounter (Signed)
Dr. Haroldine Laws, Surgical Specialist, down in [place] call and stated that patient was visiting and had to go in for emergency surgery on Wednesday for an incarcerated hernia, the incision is about 2 inches.  Since she will be coming back home here she wanted to know if we could follow up with her here and remove her staples and make an appointment now for 2 weeks.  Appointment made for Wednesday 04/12/19.

## 2019-04-02 ENCOUNTER — Other Ambulatory Visit: Payer: Self-pay | Admitting: Podiatry

## 2019-04-02 DIAGNOSIS — G6289 Other specified polyneuropathies: Secondary | ICD-10-CM

## 2019-04-03 ENCOUNTER — Telehealth: Payer: Self-pay | Admitting: Podiatry

## 2019-04-03 MED ORDER — PREGABALIN 75 MG PO CAPS: 75.0000 mg | ORAL_CAPSULE | Freq: Two times a day (BID) | ORAL | 0 refills | Status: DC

## 2019-04-03 NOTE — Telephone Encounter (Signed)
Left message with Lyrica orders. 

## 2019-04-03 NOTE — Telephone Encounter (Addendum)
I reviewed Meds & Orders and Dr. [name] had ordered the Lyrica 75mg  on 03/16/2019. Pt states she called Dr. Lillie Fragmin office and they said they didn't order the Lyrica. I found 07/29/2018 Dr. [name] orders for Lyrica 75mg  bid. I told pt I would order the Lyrica to get her to her next appt with Dr. [name].

## 2019-04-03 NOTE — Addendum Note (Signed)
Addended by: Harriett Sine D on: 04/03/2019 03:00 PM   Modules accepted: Orders

## 2019-04-03 NOTE — Telephone Encounter (Signed)
Pt called requesting refill of Lyrica

## 2019-04-06 ENCOUNTER — Ambulatory Visit (HOSPITAL_BASED_OUTPATIENT_CLINIC_OR_DEPARTMENT_OTHER): Payer: Medicare Other

## 2019-04-07 NOTE — Progress Notes (Addendum)
[place] at [facility] [address], [place], [place] [address] [phone]  Date:  04/12/2019   Name:  Margaret [name]   DOB:  November 27, 1940   MRN:  [id number]  PCP:  Darreld Mclean, MD    Chief Complaint: Suture / Staple Removal (abdomen, hernia)   History of Present Illness:  Margaret [name] is a 78 y.o. very pleasant female patient who presents with the following:  Here today for removal of surgical staples from recent emergency hernia operation  History of melanoma, gallstone pancreatitis s/p chole 2018, neuropathy, HTN, osteopenia, renal insufficiency Last by myself about 1 month ago to follow-up. I refilled her Lyrica which she uses for neuropathy  Mammogram done about 1 year ago Flu shot up-to-date Can offer her tetanus booster Shingrix is complete  She went to [place] 2 weeks ago to visit her daughter.  While in [place], she had belly pain and vomiting- she was taken to the ER, ended up having an incarcerated ventral hernia with SBO She had an incarcerated ventral hernia repair without mesh on 10/28 She was sent home 2 days later  She is now feeling fine-she has done really well following her operation.  She has not had any pain She is able to eat normally She is passing stools normally  She had requested permission to have her staples removed as opposed returning to [place].  She was asked to have her staples removed in 2 weeks, which is today  Her other concern is a CT of her abdomen and pelvis from 10/28, outside hospital This mentions a posterior left renal mass, 1.8 x 1.5 cm in diameter.  73 Hounsfield units.  I discussed this with radiology, they recommended an MRI of her abdomen with and without contrast for further evaluation  She reports a recent visit with her dermatologist, they removed a skin lesion from her abdomen and she is still waiting on path Patient Active Problem List    Diagnosis Date Noted  . Encounter for postoperative examination after surgery for malignant neoplasm 09/30/2018  . Malignant melanoma of left foot ([place]) 08/15/2018  . Plantar fasciitis of right foot 02/03/2017  . Porokeratosis 02/03/2017  . Acute pancreatitis due to calculus of common bile duct 09/17/2016  . Acute gallstone pancreatitis   . Calculus of gallbladder with acute cholecystitis and obstruction   . LFT elevation   . Stress fracture 01/01/2016  . Insomnia 12/16/2015  . Peripheral neuropathy 10/18/2015  . Renal insufficiency 03/06/2015  . HTN (hypertension) 03/06/2015  . Osteopenia 03/06/2015  . GERD (gastroesophageal reflux disease) 03/06/2015  . Vaginal pessary present 03/06/2015    Past Medical History:  Diagnosis Date  . Arthritis   . History of healed stress fracture 2013   bilateral feet  . Hx of phlebitis    reports remote hx of "superfical blood clots" never anticoagulated  . Hypertension   . Osteopenia     Past Surgical History:  Procedure Laterality Date  . CHOLECYSTECTOMY N/A 09/19/2016   Procedure: LAPAROSCOPIC CHOLECYSTECTOMY POSSIBLE INTRAOPERATIVE CHOLANGIOGRAM;  Surgeon: Rolm Bookbinder, MD;  Location: [facility];  Service: General;  Laterality: N/A;  . ERCP N/A 09/18/2016   Procedure: ENDOSCOPIC RETROGRADE CHOLANGIOPANCREATOGRAPHY (ERCP);  Surgeon: Doran Stabler, MD;  Location: [facility] ENDOSCOPY;  Service: Endoscopy;  Laterality: N/A;  . FOOT SURGERY     patient reports four foot surgeries  . FOOT SURGERY Left    bunion, hammer toe  surgery  . FOOT SURGERY Right    shaved bunion, hammer toe correction  . KNEE ARTHROSCOPY Right 1997  . REPLACEMENT TOTAL KNEE Left   . 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 RETINAL DETACHMENT SURGERY    . RETINAL DETACHMENT SURGERY Left 2008  . THYROIDECTOMY, PARTIAL Right   . THYROIDECTOMY, PARTIAL  2005    Social History   Tobacco Use  . Smoking status: Former Smoker    Quit date: 06/02/1979    Years since  quitting: 39.8  . Smokeless tobacco: Never Used  Substance Use Topics  . Alcohol use: Yes    Alcohol/week: 1.0 standard drinks    Types: 1 Glasses of wine per week    Comment: Occ  . Drug use: No    Family History  Problem Relation Age of Onset  . Heart disease Mother   . Heart attack Mother   . Heart disease Father   . Diabetes Maternal Grandmother   . Cancer Paternal Grandfather        stomach    Allergies  Allergen Reactions  . Keflex [Cephalexin] Diarrhea  . Percocet [Oxycodone-Acetaminophen] Other (See Comments)    Pt. reports that the medication causes her to be light headed and pass out.    Medication list has been reviewed and updated.  Current Outpatient Medications on File Prior to Visit  Medication Sig Dispense Refill  . acetaminophen (TYLENOL) 500 MG tablet Take by mouth.    Marland Kitchen acetaminophen-codeine (TYLENOL #3) 300-30 MG tablet Take 1 tablet by mouth every 4 (four) hours as needed for moderate pain. 25 tablet 0  . Calcium Carbonate-Vitamin D (CALCIUM HIGH POTENCY/VITAMIN D) 600-200 MG-UNIT TABS Take by mouth.    . celecoxib (CELEBREX) 100 MG capsule TAKE 1 CAPSULE(100 MG) BY MOUTH TWICE DAILY AS NEEDED 60 capsule 3  . Cranberry (THERACRAN HP) 180 MG CAPS Take by mouth.    . docusate sodium (COLACE) 100 MG capsule Take by mouth.    Mariane Baumgarten Sodium (STOOL SOFTENER LAXATIVE PO) Take 1 tablet by mouth daily.    . hydrochlorothiazide (MICROZIDE) 12.5 MG capsule TAKE 1 CAPSULE(12.5 MG) BY MOUTH DAILY 30 capsule 6  . ketoconazole (NIZORAL) 2 % cream     . LORazepam (ATIVAN) 2 MG tablet TAKE 1-1&1/2 TABLETS BY MOUTH EVERY NIGHT AT BEDTIME AS NEEDED FOR SLEEP MAY TAKE 1 TABLET DURING DAY IF NEEDED 70 tablet 2  . metoprolol succinate (TOPROL-XL) 25 MG 24 hr tablet TAKE 1 TABLET(25 MG) BY MOUTH DAILY 90 tablet 1  . Multiple Vitamins-Minerals (WOMENS DAILY FORMULA) TABS Take 1 tablet by mouth daily.    . NONFORMULARY OR COMPOUNDED ITEM [facility]:  Peripheral  Neuropathy - Bupivacaine 1%, Doxepin 3%, Gabapentin 6%, Pentoxifylline 3%, Topiramate 1%. Apply 1-2 grams to affected area 3-4 times daily. 100 each 11  . omeprazole (PRILOSEC) 20 MG capsule TAKE 1 CAPSULE BY MOUTH EVERY DAY AS NEEDED 90 capsule 3  . oxyCODONE-acetaminophen (PERCOCET/ROXICET) 5-325 MG tablet TK 1 T PO Q 4 H PRN FOR UP TO 5 DAYS    . pregabalin (LYRICA) 75 MG capsule Take 1 capsule (75 mg total) by mouth 2 (two) times daily. 120 capsule 0  . promethazine (PHENERGAN) 25 MG tablet Take 1 tablet (25 mg total) by mouth every 8 (eight) hours as needed for nausea or vomiting. 20 tablet 0   No current facility-administered medications on file prior to visit.     Review of Systems:  As per HPI- otherwise negative.  Physical Examination: Vitals:   04/12/19 0903  BP: 124/70  Pulse: 74  Resp: 18  Temp: 97.6 F (36.4 C)  SpO2: 95%   There were no vitals filed for this visit. There is no height or weight on file to calculate BMI. Ideal Body Weight:    GEN: WDWN, NAD, Non-toxic, A & O x 3, overweight, looks well HEENT: Atraumatic, Normocephalic. Neck supple. No masses, No LAD. Ears and Nose: No external deformity. CV: RRR, No M/G/R. No JVD. No thrill. No extra heart sounds. PULM: CTA B, no wheezes, crackles, rhonchi. No retractions. No resp. distress. No accessory muscle use. ABD: S, NT, ND, +BS. No rebound. No HSM. EXTR: No c/c/e NEURO Normal gait.  PSYCH: Normally interactive. Conversant. Not depressed or anxious appearing.  Calm demeanor.  Well-healed surgical incision, vertical, inferior to umbilicus No sign of infection or wound dehiscence.  Removed all staples, approximately 9   Assessment and Plan: Hospital discharge follow-up  Encounter for screening mammogram for malignant neoplasm of breast - Plan: MM 3D SCREEN BREAST BILATERAL  Encounter for staple removal  Renal mass, left - Plan: Basic metabolic panel  Adjustment insomnia - Plan: LORazepam (ATIVAN) 2  MG tablet  Other specified disorders of kidney and ureter - Plan: MR Abdomen W Wo Contrast  Following up from recent admission at outside hospital for acute strangulated hernia.  She was treated surgically and recovered well, feeling fine Removed stables today Labs pending as above- need renal function for MRI Ordered on open machine as pt has claustrophobia.  She may use her lorazepam if needed  Ordered MRI to eval left kidney mass seen on CT scan at [place], MD  Received her labs-  Results for orders placed or performed in visit on [id number]  Basic metabolic panel  Result Value Ref Range   Sodium 143 135 - 145 mEq/L   Potassium 4.1 3.5 - 5.1 mEq/L   Chloride 103 96 - 112 mEq/L   CO2 31 19 - 32 mEq/L   Glucose, Bld 96 70 - 99 mg/dL   BUN 24 (H) 6 - 23 mg/dL   Creatinine, Ser 1.03 0.40 - 1.20 mg/dL   GFR 51.74 (L) >60.00 mL/min   Calcium 9.7 8.4 - 10.5 mg/dL   Message to pt- should be fine for contrast

## 2019-04-11 DIAGNOSIS — Z85820 Personal history of malignant melanoma of skin: Secondary | ICD-10-CM | POA: Diagnosis not present

## 2019-04-11 DIAGNOSIS — L820 Inflamed seborrheic keratosis: Secondary | ICD-10-CM | POA: Diagnosis not present

## 2019-04-11 DIAGNOSIS — D485 Neoplasm of uncertain behavior of skin: Secondary | ICD-10-CM | POA: Diagnosis not present

## 2019-04-11 DIAGNOSIS — L814 Other melanin hyperpigmentation: Secondary | ICD-10-CM | POA: Diagnosis not present

## 2019-04-11 DIAGNOSIS — D225 Melanocytic nevi of trunk: Secondary | ICD-10-CM | POA: Diagnosis not present

## 2019-04-11 DIAGNOSIS — D2230 Melanocytic nevi of unspecified part of face: Secondary | ICD-10-CM | POA: Diagnosis not present

## 2019-04-11 DIAGNOSIS — L821 Other seborrheic keratosis: Secondary | ICD-10-CM | POA: Diagnosis not present

## 2019-04-12 ENCOUNTER — Encounter: Payer: Self-pay | Admitting: Family Medicine

## 2019-04-12 ENCOUNTER — Ambulatory Visit (INDEPENDENT_AMBULATORY_CARE_PROVIDER_SITE_OTHER): Payer: Medicare Other | Admitting: Family Medicine

## 2019-04-12 ENCOUNTER — Other Ambulatory Visit: Payer: Self-pay

## 2019-04-12 ENCOUNTER — Ambulatory Visit (HOSPITAL_BASED_OUTPATIENT_CLINIC_OR_DEPARTMENT_OTHER)
Admission: RE | Admit: 2019-04-12 | Discharge: 2019-04-12 | Disposition: A | Discharge status: Home / Self Care | Payer: Medicare Other | Source: Ambulatory Visit | Attending: Family Medicine | Admitting: Family Medicine

## 2019-04-12 ENCOUNTER — Encounter: Payer: Medicare Other | Admitting: Obstetrics & Gynecology

## 2019-04-12 VITALS — BP 124/70 | HR 74 | Temp 97.6°F | Resp 18

## 2019-04-12 DIAGNOSIS — Z09 Encounter for follow-up examination after completed treatment for conditions other than malignant neoplasm: Secondary | ICD-10-CM

## 2019-04-12 DIAGNOSIS — Z1231 Encounter for screening mammogram for malignant neoplasm of breast: Secondary | ICD-10-CM | POA: Insufficient documentation

## 2019-04-12 DIAGNOSIS — F5102 Adjustment insomnia: Secondary | ICD-10-CM | POA: Diagnosis not present

## 2019-04-12 DIAGNOSIS — Z4802 Encounter for removal of sutures: Secondary | ICD-10-CM | POA: Diagnosis not present

## 2019-04-12 DIAGNOSIS — N2889 Other specified disorders of kidney and ureter: Secondary | ICD-10-CM | POA: Diagnosis not present

## 2019-04-12 LAB — BASIC METABOLIC PANEL
BUN: 24 mg/dL — ABNORMAL HIGH (ref 6–23)
CO2: 31 mEq/L (ref 19–32)
Calcium: 9.7 mg/dL (ref 8.4–10.5)
Chloride: 103 mEq/L (ref 96–112)
Creatinine, Ser: 1.03 mg/dL (ref 0.40–1.20)
GFR: 51.74 mL/min — ABNORMAL LOW (ref >60.00)
Glucose, Bld: 96 mg/dL (ref 70–99)
Potassium: 4.1 mEq/L (ref 3.5–5.1)
Sodium: 143 mEq/L (ref 135–145)

## 2019-04-12 MED ORDER — LORAZEPAM 2 MG PO TABS: ORAL_TABLET | ORAL | 2 refills | Status: DC

## 2019-04-12 MED ORDER — AMOXICILLIN 500 MG PO CAPS: 2000.0000 mg | ORAL_CAPSULE | Freq: Once | ORAL | 1 refills | Status: DC

## 2019-04-12 NOTE — Patient Instructions (Addendum)
It was great to see you again today, I hope you have a happy Thanksgiving Please stop by the imaging dept on the ground floor on the way out today to get your mammogram if possible We took your staples out today- your wound looks great.  However please let me know if you have any suspicion of infection- heat, redness, or increasing pain    Radiology recommended an MRI for you to evaluate your kidney concern I am going to check your kidney function today so that you can get contrast

## 2019-04-17 ENCOUNTER — Other Ambulatory Visit: Payer: Self-pay

## 2019-04-17 ENCOUNTER — Other Ambulatory Visit: Payer: Self-pay | Admitting: Family Medicine

## 2019-04-17 ENCOUNTER — Ambulatory Visit (INDEPENDENT_AMBULATORY_CARE_PROVIDER_SITE_OTHER): Payer: Medicare Other | Admitting: Podiatry

## 2019-04-17 DIAGNOSIS — Q828 Other specified congenital malformations of skin: Secondary | ICD-10-CM

## 2019-04-17 DIAGNOSIS — G629 Polyneuropathy, unspecified: Secondary | ICD-10-CM

## 2019-04-17 DIAGNOSIS — K219 Gastro-esophageal reflux disease without esophagitis: Secondary | ICD-10-CM

## 2019-04-24 NOTE — Progress Notes (Signed)
Subjective: 78 year old female presents the office today requesting calluses to be trimmed as they are causing pain.  Denies any redness or drainage and swelling to the callus sites or the feet. Denies any systemic complaints such as fevers, chills, nausea, vomiting. No acute changes since last appointment, and no other complaints at this time.   Since she was last seen she underwent emergency surgery for hernia.  Objective: AAO x3, NAD Hyperkeratotic lesions present left foot metatarsal area as well as fifth metatarsal base.  No ongoing ulceration drainage or signs of infection.  There is no significant callus formation right side today.  Multiple digital findings are present but in the point present.  Arthritic changes present in the dorsal midfoot on the right side. No pain with calf compression, swelling, warmth, erythema  Assessment: Symptomatic calluses bilaterally chronic foot pain  Plan: -All treatment options discussed with the patient including all alternatives, risks, complications.  -Debrided hyperkeratotic lesions x2 without any complications or bleeding -Continue orthotics.  She states they are very comfortable. -Patient encouraged to call the office with any questions, concerns, change in symptoms.   Trula Slade DPM

## 2019-04-26 ENCOUNTER — Telehealth: Payer: Self-pay

## 2019-04-26 NOTE — Telephone Encounter (Signed)
Copied from [place] [id number]. Topic: General - Other >> Apr 26, 2019 11:18 AM Carolyn Stare wrote:   Pt has a MRI schedule for Mon and will be taking LORazepam (ATIVAN) 2 MG tablet    her question is when should she take the medication   if no answer leave message

## 2019-04-27 ENCOUNTER — Encounter: Payer: Self-pay | Admitting: Family Medicine

## 2019-05-01 DIAGNOSIS — N281 Cyst of kidney, acquired: Secondary | ICD-10-CM | POA: Diagnosis not present

## 2019-05-03 ENCOUNTER — Telehealth: Payer: Self-pay

## 2019-05-03 DIAGNOSIS — N281 Cyst of kidney, acquired: Secondary | ICD-10-CM

## 2019-05-03 NOTE — Telephone Encounter (Signed)
Copied from [place] [id number]. Topic: General - Inquiry >> May 03, 2019  3:34 PM [name] wrote: Patient inquiring about MRI results taken on 05/01/2019

## 2019-05-04 ENCOUNTER — Encounter: Payer: Self-pay | Admitting: Family Medicine

## 2019-05-04 DIAGNOSIS — H25093 Other age-related incipient cataract, bilateral: Secondary | ICD-10-CM | POA: Diagnosis not present

## 2019-05-04 DIAGNOSIS — Z9889 Other specified postprocedural states: Secondary | ICD-10-CM | POA: Diagnosis not present

## 2019-05-04 DIAGNOSIS — Z961 Presence of intraocular lens: Secondary | ICD-10-CM | POA: Diagnosis not present

## 2019-05-04 NOTE — Telephone Encounter (Signed)
I think it was done at [facility] - can you call tomorrow and request report.  Thank you!

## 2019-05-04 NOTE — Telephone Encounter (Signed)
Dr. [name], im not seeing where patient had mri on 11/30. Have you discussed this with patient? I can call to clarify just wanted to check with you first.

## 2019-05-05 NOTE — Telephone Encounter (Signed)
Called [facility] - appears that she has a cyst and not cancer

## 2019-05-05 NOTE — Telephone Encounter (Signed)
Waiting on results to be faxed-will check again Monday.

## 2019-05-05 NOTE — Telephone Encounter (Signed)
Called and reached her daughter as I could not find patient at the moment- no cancer seen

## 2019-05-05 NOTE — Telephone Encounter (Signed)
Spoke to [facility], Requested report to be faxed to [name]. Waiting on fax.

## 2019-05-08 ENCOUNTER — Encounter: Payer: Self-pay | Admitting: Family Medicine

## 2019-05-08 ENCOUNTER — Other Ambulatory Visit: Payer: Self-pay

## 2019-05-08 ENCOUNTER — Ambulatory Visit (INDEPENDENT_AMBULATORY_CARE_PROVIDER_SITE_OTHER): Payer: Medicare Other | Admitting: Obstetrics & Gynecology

## 2019-05-08 ENCOUNTER — Encounter: Payer: Self-pay | Admitting: Obstetrics & Gynecology

## 2019-05-08 VITALS — BP 129/67 | HR 66 | Ht 61.0 in | Wt 162.1 lb

## 2019-05-08 DIAGNOSIS — N812 Incomplete uterovaginal prolapse: Secondary | ICD-10-CM

## 2019-05-08 NOTE — Telephone Encounter (Signed)
Received faxed copy of MRI report Will scan to chart Impression reads complicated superior left renal cyst- recommend CT in 3 months for stability  Called radiology for clarification on CT type Pre and post contrast recommended - renal protocol  Will order Message to pt with this follow-up

## 2019-05-08 NOTE — Progress Notes (Signed)
  HPI Pt presents today to have pessary removed and cleaned.  She denies problems and reports that her pessary is working very well.  She denies further complaints.  Se denies leakage of urine but, reports that she feel excess tuissue when she wipes.    Review of Systems: since last visit pt had a incarcerated ventral hernia with emergency laparotomy; dx of melanoma of left leg and bunionectomy. She is doing well now.        Objective:   Physical Exam Pt in [facility]: Pessary removed and cleaned; vaginal mucosa looks healty and with no excoriations or breakdown. Pt still with uterine and vagina prolapse- incomplete.  The urethra is protruding but, this is unchanged.     Assessment:     Pessary check. D/w option of surgery. She wants to keep pessary for now.     Plan:     F/u in 3 months to have pessary removed and cleaned F/u sooner prn   Total face-to-face time with patient was 15 min.  Greater than 50% was spent in counseling and coordination of care with the patient.   Heavin Sebree L. Harraway-Smith, M.D., Cherlynn June

## 2019-05-08 NOTE — Addendum Note (Signed)
Addended by: Lamar Blinks C on: 05/08/2019 05:12 PM   Modules accepted: Orders

## 2019-05-15 ENCOUNTER — Ambulatory Visit (INDEPENDENT_AMBULATORY_CARE_PROVIDER_SITE_OTHER): Payer: Medicare Other | Admitting: Podiatry

## 2019-05-15 ENCOUNTER — Other Ambulatory Visit: Payer: Self-pay

## 2019-05-15 DIAGNOSIS — G8929 Other chronic pain: Secondary | ICD-10-CM

## 2019-05-15 DIAGNOSIS — M2011 Hallux valgus (acquired), right foot: Secondary | ICD-10-CM

## 2019-05-15 DIAGNOSIS — G629 Polyneuropathy, unspecified: Secondary | ICD-10-CM

## 2019-05-15 DIAGNOSIS — M79673 Pain in unspecified foot: Secondary | ICD-10-CM

## 2019-05-15 DIAGNOSIS — Q828 Other specified congenital malformations of skin: Secondary | ICD-10-CM

## 2019-05-16 NOTE — Telephone Encounter (Signed)
Pt had not seen recent Mychart message about repeat CT in 3 months Called her and LMOM explaining why we ordered a follow-up CT Will communicate back with Hoyle Sauer at radiology to schedule

## 2019-05-16 NOTE — Telephone Encounter (Signed)
-----   Message from Katha Hamming sent at 05/10/2019  3:21 PM EST ----- Regarding: CT ABD/PELVIS  W/WO I called Leda Gauze to schedule her CT.  The patient stated she didn't need this CT because her MRI was negative.    Thank you, Hoyle Sauer

## 2019-05-17 ENCOUNTER — Other Ambulatory Visit: Payer: Self-pay | Admitting: Family Medicine

## 2019-05-17 DIAGNOSIS — Z5181 Encounter for therapeutic drug level monitoring: Secondary | ICD-10-CM

## 2019-05-18 ENCOUNTER — Telehealth: Payer: Self-pay

## 2019-05-18 NOTE — Telephone Encounter (Signed)
Copied from [place] [id number]. Topic: General - Other >> May 17, 2019  4:10 PM Keene Breath wrote: Reason for CRM: Patient would like the doctor to call today because she did not understand the previous message she left.  CB# [phone]

## 2019-05-18 NOTE — Telephone Encounter (Signed)
Pt stated to please disregard request for callback. She listened to Dr. Lillie Fragmin message again and understands.

## 2019-05-24 NOTE — Progress Notes (Signed)
Subjective: 78 year old female presents the office today requesting calluses to be trimmed as they are causing pain.  Denies any redness or drainage and swelling to the callus sites or the feet.  She still having pain chronic pain to both tops of her foot as well as the bunions.  She denies any recent injury to the feet.  Denies any systemic complaints such as fevers, chills, nausea, vomiting. No acute changes since last appointment, and no other complaints at this time.    Objective: AAO x3, NAD Hyperkeratotic lesions present left foot metatarsal area as well as fifth metatarsal base.  No ongoing ulceration drainage or signs of infection.  There is no significant callus formation right side today.  Multiple digital findings are present but in the point present.  Arthritic changes present in the dorsal midfoot on the right side. No pain with calf compression, swelling, warmth, erythema  Assessment: Symptomatic calluses bilaterally chronic foot pain  Plan: -All treatment options discussed with the patient including all alternatives, risks, complications.  -Debrided hyperkeratotic lesions x2 without any complications or bleeding -Discussed steroid injection but she declines. -Continue orthotics.  She states they are very comfortable.  We discussed changing shoes and different types of shoes that she can try.  Voltaren gel as needed. -Patient encouraged to call the office with any questions, concerns, change in symptoms.   Trula Slade DPM

## 2019-05-29 ENCOUNTER — Telehealth: Payer: Self-pay | Admitting: Podiatry

## 2019-05-29 ENCOUNTER — Telehealth: Payer: Self-pay

## 2019-05-29 ENCOUNTER — Encounter: Payer: Self-pay | Admitting: Family Medicine

## 2019-05-29 MED ORDER — PREGABALIN 75 MG PO CAPS: 75.0000 mg | ORAL_CAPSULE | Freq: Two times a day (BID) | ORAL | 1 refills | Status: DC

## 2019-05-29 NOTE — Addendum Note (Signed)
Addended by: Lamar Blinks C on: 05/29/2019 01:02 PM   Modules accepted: Orders

## 2019-05-29 NOTE — Telephone Encounter (Signed)
Pt would like a refill ( Pregablin)  medication, due to pain. " I have been taking 3 a day and I only had a 30day supply, now I'm completely out. I really need a refill, Im in a lot of pain".

## 2019-05-29 NOTE — Telephone Encounter (Signed)
Pt called back about getting her Rx refilled due to her severe pain. Stated whoever she spoke to about an hour ago never called her back. I asked if it was for the Pregabalin 75 mg and the pt stated it was. I told her it had been e-scribed by Dr. Lillie Fragmin office today at 1:02 pm to [facility] #[id number].

## 2019-05-29 NOTE — Telephone Encounter (Signed)
Pt called again for refill on Pregabalin, she can not go another night without something for pain.

## 2019-05-29 NOTE — Telephone Encounter (Signed)
Copied from [place] [id number]. Topic: General - Other >> May 25, 2019  1:57 PM Leward Quan A wrote: Reason for CRM: [facility] called to request a change in the directions of a medication pregabalin (LYRICA) 75 MG capsule states that patient told them she was directed by doctor to take three times a day. Per pharmacy rep they need a new Rx stating that patient should be taking this medication 3 times daily plus need more pills in order for Rx to last. Please advise and contact patient and or pharmacy

## 2019-05-29 NOTE — Telephone Encounter (Signed)
Ok done

## 2019-05-30 ENCOUNTER — Telehealth: Payer: Self-pay | Admitting: *Deleted

## 2019-05-30 ENCOUNTER — Other Ambulatory Visit: Payer: Self-pay | Admitting: Podiatry

## 2019-05-30 ENCOUNTER — Other Ambulatory Visit: Payer: Self-pay | Admitting: *Deleted

## 2019-05-30 ENCOUNTER — Telehealth: Payer: Self-pay | Admitting: Podiatry

## 2019-05-30 MED ORDER — PREGABALIN 75 MG PO CAPS: 75.0000 mg | ORAL_CAPSULE | Freq: Two times a day (BID) | ORAL | 0 refills | Status: DC

## 2019-05-30 MED ORDER — PREGABALIN 75 MG PO CAPS: 75.0000 mg | ORAL_CAPSULE | Freq: Three times a day (TID) | ORAL | 1 refills | Status: DC

## 2019-05-30 NOTE — Progress Notes (Signed)
Error

## 2019-05-30 NOTE — Telephone Encounter (Signed)
Called and spoke with the pharmacist at [facility] [address] [place] and the pharmacist stated that the patient was taken 3 pills a day and the prescription was to take 2 times a day and the pharmacist stated that to the patient and if the patient was going to take the 3 pills a day that there needed to be a prescription from Dr [facility] office stating that and I stated to the pharmacist that I would call the patient and relay the message about taking 2 pills a day and the patient understood and patient is going to pick up prescription today and if any concerns or questions to call the office. Lattie Haw

## 2019-05-30 NOTE — Telephone Encounter (Signed)
Please let her know that I was going to go refill it but it looks like her PCP refilled it yesterday. If she needs me to do something else please let me know. Thanks.

## 2019-05-30 NOTE — Progress Notes (Signed)
I called the patient. She has been taking lyrica 75mg  TID without any side affects and it helps. Ordered it as such. Creatine is stable. Will monitor any side affects. She verbalized understanding and no further questions or concerns.

## 2019-06-12 ENCOUNTER — Other Ambulatory Visit: Payer: Self-pay

## 2019-06-12 ENCOUNTER — Ambulatory Visit (INDEPENDENT_AMBULATORY_CARE_PROVIDER_SITE_OTHER): Payer: Medicare Other | Admitting: Podiatry

## 2019-06-12 ENCOUNTER — Encounter: Payer: Self-pay | Admitting: Podiatry

## 2019-06-12 DIAGNOSIS — Q828 Other specified congenital malformations of skin: Secondary | ICD-10-CM

## 2019-06-12 DIAGNOSIS — G629 Polyneuropathy, unspecified: Secondary | ICD-10-CM

## 2019-06-16 NOTE — Progress Notes (Signed)
Subjective: 79 year old female presents the office today requesting calluses to be trimmed as they are causing pain.  She states that only the calluses are causing pain and she is having no other pain to her feet she states the Lyrica has been very helpful and not had any side effects.  She is taking 1 pill 3 times a day. Denies any systemic complaints such as fevers, chills, nausea, vomiting. No acute changes since last appointment, and no other complaints at this time.    Objective: AAO x3, NAD Hyperkeratotic lesions present left foot metatarsal area as well as fifth metatarsal base.  No ongoing ulceration drainage or signs of infection.  Mild hyperkeratotic tissue submetatarsal right foot without any underlying ulceration or signs of infection.  Multiple digital findings are present but in the point present.  Arthritic changes present in the dorsal midfoot on the right side.  No other areas of tenderness. No pain with calf compression, swelling, warmth, erythema  Assessment: Symptomatic callus formation bilaterally, neuropathy  Plan: -All treatment options discussed with the patient including all alternatives, risks, complications.  -Debrided hyperkeratotic lesions x3 without any complications or bleeding -Continue Lyrica as prescribed -Patient encouraged to call the office with any questions, concerns, change in symptoms.   Trula Slade DPM

## 2019-06-22 ENCOUNTER — Telehealth: Payer: Self-pay | Admitting: Family Medicine

## 2019-06-22 NOTE — Telephone Encounter (Signed)
Patient would like a Rx of anabiotics due to recent knee surgery. She has a dental appointment next week.

## 2019-06-23 MED ORDER — AMOXICILLIN 500 MG PO CAPS: 2000.0000 mg | ORAL_CAPSULE | Freq: Once | ORAL | 1 refills | Status: AC

## 2019-06-26 ENCOUNTER — Telehealth: Payer: Self-pay | Admitting: Family Medicine

## 2019-06-26 NOTE — Telephone Encounter (Signed)
Called patient to get some information regarding the CT. Patient had mri in December-cyst on kidney found however it was benign. Then order for CT was placed on 05/07/2020 for patient to have done on 07/03/2018. Could you please clarify if this CT is needed if so why if her cyst was non cancerous.

## 2019-06-26 NOTE — Telephone Encounter (Signed)
Patient would like know why she is schedule for a Ct next week ? She would like someone to call her back

## 2019-06-27 NOTE — Telephone Encounter (Signed)
Called patient, explained to her the reason for the CT. She verbalized understating.

## 2019-06-27 NOTE — Telephone Encounter (Signed)
Hi Bailey-I discussed this with her earlier, but she may have forgotten  Her MRI in December showed a likely cyst, but there was some motion which impeded the radiology read. They did recommend a follow-up CT in 3 months to ensure stability  Hi Jazira I got a copy of your MRI report today.  They mention the cyst in the left kidney- there was a bit of motion during the scan so the pictures are not perfect. They recommend a follow-up CT in 3 months to make sure the cyst does not chage. I went ahead and ordered this, can be done at the [facility].  We will plan to do this in February/ March   I do still recommend that she have a follow-up CT as ordered-can you please give her a call and clarify    MRI report from 05/01/2019 is on care everywhere IMPRESSION: Complicated superior left renal cyst, suboptimally evaluated due to patient motion. Although there is no measurable enhancement, this may be secondary to motion. Recommend 3 month follow-up with multiphase CT Electronically Signed by: Tomma Lightning

## 2019-07-03 ENCOUNTER — Other Ambulatory Visit: Payer: Self-pay | Admitting: Family Medicine

## 2019-07-03 DIAGNOSIS — Z5181 Encounter for therapeutic drug level monitoring: Secondary | ICD-10-CM

## 2019-07-04 ENCOUNTER — Inpatient Hospital Stay (HOSPITAL_BASED_OUTPATIENT_CLINIC_OR_DEPARTMENT_OTHER): Admission: RE | Admit: 2019-07-04 | Payer: Medicare Other | Source: Ambulatory Visit

## 2019-07-13 ENCOUNTER — Ambulatory Visit (INDEPENDENT_AMBULATORY_CARE_PROVIDER_SITE_OTHER): Payer: Medicare Other | Admitting: Podiatry

## 2019-07-13 ENCOUNTER — Encounter: Payer: Self-pay | Admitting: Podiatry

## 2019-07-13 ENCOUNTER — Other Ambulatory Visit: Payer: Self-pay

## 2019-07-13 DIAGNOSIS — G629 Polyneuropathy, unspecified: Secondary | ICD-10-CM

## 2019-07-13 DIAGNOSIS — Q828 Other specified congenital malformations of skin: Secondary | ICD-10-CM

## 2019-07-13 NOTE — Telephone Encounter (Signed)
Caller Name: atonya, mcglamery Phone: [phone]     Pt states CT w/contrast is on 2/22. She needs to have bloodwork between 2/15 and 2/22. Please enter orders and advise.

## 2019-07-13 NOTE — Progress Notes (Signed)
Subjective: 79 year old female presents the office today requesting calluses to be trimmed as they are causing pain. She has no other concerns. Lyrica is helping. Denies any systemic complaints such as fevers, chills, nausea, vomiting. No acute changes since last appointment, and no other complaints at this time.   Objective: AAO x3, NAD Hyperkeratotic lesions present left foot metatarsal area as well as fifth metatarsal base.  No ongoing ulceration drainage or signs of infection.  Mild hyperkeratotic tissue submetatarsal right foot without any underlying ulceration or signs of infection.   No pain with calf compression, swelling, warmth, erythema  Assessment: Symptomatic callus formation bilaterally, neuropathy  Plan: -All treatment options discussed with the patient including all alternatives, risks, complications.  -Debrided hyperkeratotic lesions x3 without any complications or bleeding -Continue Lyrica as prescribed -Patient encouraged to call the office with any questions, concerns, change in symptoms.   Trula Slade DPM

## 2019-07-14 NOTE — Telephone Encounter (Signed)
Lab orders placed, sent patient message to schedule appointment.

## 2019-07-19 ENCOUNTER — Other Ambulatory Visit (INDEPENDENT_AMBULATORY_CARE_PROVIDER_SITE_OTHER): Payer: Medicare Other

## 2019-07-19 ENCOUNTER — Other Ambulatory Visit: Payer: Self-pay

## 2019-07-19 DIAGNOSIS — Z5181 Encounter for therapeutic drug level monitoring: Secondary | ICD-10-CM

## 2019-07-19 NOTE — Addendum Note (Signed)
Addended by: Kelle Darting A on: 07/19/2019 03:53 PM   Modules accepted: Orders

## 2019-07-20 ENCOUNTER — Encounter: Payer: Self-pay | Admitting: Family Medicine

## 2019-07-20 LAB — BASIC METABOLIC PANEL WITH GFR
BUN/Creatinine Ratio: 25 (calc) — ABNORMAL HIGH (ref 6–22)
BUN: 30 mg/dL — ABNORMAL HIGH (ref 7–25)
CO2: 29 mmol/L (ref 20–32)
Calcium: 9.8 mg/dL (ref 8.6–10.4)
Chloride: 104 mmol/L (ref 98–110)
Creat: 1.21 mg/dL — ABNORMAL HIGH (ref 0.60–0.93)
Glucose, Bld: 116 mg/dL — ABNORMAL HIGH (ref 65–99)
Potassium: 3.9 mmol/L (ref 3.5–5.3)
Sodium: 144 mmol/L (ref 135–146)

## 2019-07-24 ENCOUNTER — Encounter (HOSPITAL_BASED_OUTPATIENT_CLINIC_OR_DEPARTMENT_OTHER): Payer: Self-pay

## 2019-07-24 ENCOUNTER — Encounter: Payer: Self-pay | Admitting: Family Medicine

## 2019-07-24 ENCOUNTER — Ambulatory Visit (HOSPITAL_BASED_OUTPATIENT_CLINIC_OR_DEPARTMENT_OTHER)
Admission: RE | Admit: 2019-07-24 | Discharge: 2019-07-24 | Disposition: A | Discharge status: Home / Self Care | Payer: Medicare Other | Source: Ambulatory Visit | Attending: Family Medicine | Admitting: Family Medicine

## 2019-07-24 ENCOUNTER — Telehealth: Payer: Self-pay | Admitting: Family Medicine

## 2019-07-24 ENCOUNTER — Other Ambulatory Visit: Payer: Self-pay

## 2019-07-24 DIAGNOSIS — N2889 Other specified disorders of kidney and ureter: Secondary | ICD-10-CM

## 2019-07-24 DIAGNOSIS — N281 Cyst of kidney, acquired: Secondary | ICD-10-CM | POA: Diagnosis not present

## 2019-07-24 MED ORDER — IOHEXOL 350 MG/ML SOLN
100.0000 mL | Freq: Once | INTRAVENOUS | Status: AC | PRN
  Administered 2019-07-24: 100 mL via INTRAVENOUS

## 2019-07-24 NOTE — Telephone Encounter (Signed)
Received her recent CT follow-up, unfortunately it does appear that she may have renal cell carcinoma  Called patient to discuss- LMOM at home.  I called cell and did not get an answer, will send MyChart message  IMPRESSION: 1. Mildly enhancing exophytic left renal lesion, worrisome for renal cell carcinoma. 2. Borderline enlarged inguinal lymph nodes are nonspecific.   I will get her in to see urology as soon as possible

## 2019-07-24 NOTE — Telephone Encounter (Signed)
Called and spoke with pt and her daughter and explained plan to see urology asap. Answered questions- they state understanding of the plan

## 2019-07-31 ENCOUNTER — Other Ambulatory Visit: Payer: Self-pay | Admitting: Podiatry

## 2019-07-31 ENCOUNTER — Telehealth: Payer: Self-pay | Admitting: Podiatry

## 2019-07-31 MED ORDER — PREGABALIN 75 MG PO CAPS: 75.0000 mg | ORAL_CAPSULE | Freq: Three times a day (TID) | ORAL | 1 refills | Status: DC

## 2019-07-31 NOTE — Telephone Encounter (Signed)
Please advise. Thanks Annaleigha Woo 

## 2019-07-31 NOTE — Telephone Encounter (Signed)
Pt called and needs her Lyrica called into the pharmacy. She uses [facility] on [address] in [place].

## 2019-07-31 NOTE — Telephone Encounter (Signed)
Sent!

## 2019-08-03 ENCOUNTER — Telehealth: Payer: Self-pay

## 2019-08-03 DIAGNOSIS — M25561 Pain in right knee: Secondary | ICD-10-CM | POA: Insufficient documentation

## 2019-08-03 DIAGNOSIS — I10 Essential (primary) hypertension: Secondary | ICD-10-CM

## 2019-08-03 MED ORDER — METOPROLOL SUCCINATE ER 25 MG PO TB24: ORAL_TABLET | ORAL | 1 refills | Status: DC

## 2019-08-03 MED ORDER — HYDROCHLOROTHIAZIDE 12.5 MG PO CAPS: ORAL_CAPSULE | ORAL | 6 refills | Status: DC

## 2019-08-03 NOTE — Telephone Encounter (Signed)
Patient called in to see if DR. Copland can send in a prescription for hydrochlorothiazide (MICROZIDE) 12.5 MG capsule [id number]   &metoprolol succinate (TOPROL-XL) 25 MG 24 hr tablet [id number]    Please send to: [facility] [id number], [address] AT [facility] [address]  [place], [place] [address]  Phone:  [phone] Fax:  [phone]

## 2019-08-03 NOTE — Telephone Encounter (Signed)
Medication refilled and sent to pharmacy.

## 2019-08-07 DIAGNOSIS — D4102 Neoplasm of uncertain behavior of left kidney: Secondary | ICD-10-CM | POA: Diagnosis not present

## 2019-08-10 ENCOUNTER — Ambulatory Visit: Payer: Medicare Other | Admitting: Podiatry

## 2019-08-15 ENCOUNTER — Ambulatory Visit (INDEPENDENT_AMBULATORY_CARE_PROVIDER_SITE_OTHER): Payer: Medicare Other | Admitting: Podiatry

## 2019-08-15 ENCOUNTER — Other Ambulatory Visit: Payer: Self-pay

## 2019-08-15 DIAGNOSIS — Q828 Other specified congenital malformations of skin: Secondary | ICD-10-CM

## 2019-08-15 DIAGNOSIS — G629 Polyneuropathy, unspecified: Secondary | ICD-10-CM

## 2019-08-20 NOTE — Progress Notes (Signed)
Subjective: 79 year old female presents the office today due to painful calluses that she would have trimmed.  Denies any recent injury increase in swelling.  She still on Lyrica which is been helpful.  Since I last saw her she has followed up with urology for renal cell carcinoma which they are monitoring. Denies any systemic complaints such as fevers, chills, nausea, vomiting. No acute changes since last appointment, and no other complaints at this time.   Objective: AAO x3, NAD Hyperkeratotic lesions present left foot metatarsal area as well as fifth metatarsal base.  Upon debridement there is no ongoing ulceration drainage or any signs of infection. Multiple digital deformities are present which are unchanged. No pain with calf compression, swelling, warmth, erythema  Assessment: Symptomatic callus formation bilaterally, neuropathy  Plan: -All treatment options discussed with the patient including all alternatives, risks, complications.  -Debrided hyperkeratotic lesions x3 without any complications or bleeding -Continue Lyrica as prescribed -Patient encouraged to call the office with any questions, concerns, change in symptoms.   Trula Slade DPM

## 2019-09-07 DIAGNOSIS — M1711 Unilateral primary osteoarthritis, right knee: Secondary | ICD-10-CM | POA: Diagnosis not present

## 2019-09-07 DIAGNOSIS — M25561 Pain in right knee: Secondary | ICD-10-CM | POA: Diagnosis not present

## 2019-09-11 ENCOUNTER — Encounter: Payer: Self-pay | Admitting: Family Medicine

## 2019-09-12 ENCOUNTER — Encounter: Payer: Self-pay | Admitting: Podiatry

## 2019-09-12 ENCOUNTER — Other Ambulatory Visit: Payer: Self-pay

## 2019-09-12 ENCOUNTER — Ambulatory Visit (INDEPENDENT_AMBULATORY_CARE_PROVIDER_SITE_OTHER): Payer: Medicare Other | Admitting: Podiatry

## 2019-09-12 VITALS — Temp 97.9°F

## 2019-09-12 DIAGNOSIS — G629 Polyneuropathy, unspecified: Secondary | ICD-10-CM

## 2019-09-12 DIAGNOSIS — Q828 Other specified congenital malformations of skin: Secondary | ICD-10-CM

## 2019-09-15 NOTE — Progress Notes (Signed)
Subjective: 79 year old female presents the office today due to painful calluses that she would have trimmed.  Denies any redness or drainage or any swelling.  She occasionally gets foot pain.  She is on Lyrica but she feels is not helping as much as it did previously.  She is current taking 75 mg 3 times a day. Denies any systemic complaints such as fevers, chills, nausea, vomiting. No acute changes since last appointment, and no other complaints at this time.   Objective: AAO x3, NAD Hyperkeratotic lesions present left foot metatarsal area as well as fifth metatarsal base.  Upon debridement there is no ongoing ulceration drainage or any signs of infection. Multiple digital deformities are present which are unchanged. No pain with calf compression, swelling, warmth, erythema  Assessment: Symptomatic callus formation bilaterally, neuropathy  Plan: -All treatment options discussed with the patient including all alternatives, risks, complications.  -Debrided hyperkeratotic lesions x3 without any complications or bleeding -Continue Lyrica as prescribed.  Will discuss with her primary care in regards to her increase in his toes. -Patient encouraged to call the office with any questions, concerns, change in symptoms.   Trula Slade DPM

## 2019-10-04 ENCOUNTER — Telehealth: Payer: Self-pay | Admitting: Podiatry

## 2019-10-04 NOTE — Telephone Encounter (Signed)
Pt called and wanted a refill on her pain medication pregabalin please assist

## 2019-10-05 ENCOUNTER — Telehealth: Payer: Self-pay | Admitting: Family Medicine

## 2019-10-05 ENCOUNTER — Telehealth: Payer: Self-pay | Admitting: *Deleted

## 2019-10-05 MED ORDER — PREGABALIN 75 MG PO CAPS: 75.0000 mg | ORAL_CAPSULE | Freq: Three times a day (TID) | ORAL | 5 refills | Status: DC

## 2019-10-05 NOTE — Telephone Encounter (Signed)
Caller: Margaret [name]  Call Back [phone]  Patient has two questions?  1. Patient is schedule for surgery and is wondering if we could do her Pre-OP Work up.  2. Patient states she is going on a travel in a few days and would like a few sleeping pills for travel.    Please advise

## 2019-10-05 NOTE — Telephone Encounter (Signed)
Called pt and LMOM We are all set for pre-op appt I would suggest that she use her lorazepam for sleep- would not suggest a hypnotic at her age.  Does she need a refill?  [place]

## 2019-10-05 NOTE — Telephone Encounter (Signed)
I called pt and told her I did not receive the refill request but I do see that Dr. [name] had ordered the Lyrica and I refilled with +5refills. Left message with orders for Lyrica at [facility].

## 2019-10-05 NOTE — Telephone Encounter (Signed)
Pt sates she is trying to get her Pregabalin refilled.

## 2019-10-05 NOTE — Telephone Encounter (Signed)
Patient has appointment on 10/19/2018 for pre op appt. Please advise about sleeping concern.

## 2019-10-06 NOTE — Telephone Encounter (Signed)
Pt returning your call. [phone].

## 2019-10-06 NOTE — Telephone Encounter (Signed)
Called-missed her again.  LMOM- can she please send me a mychart message and I will try to answer her concerns there  JC

## 2019-10-09 ENCOUNTER — Encounter: Payer: Self-pay | Admitting: Podiatry

## 2019-10-09 ENCOUNTER — Other Ambulatory Visit: Payer: Self-pay

## 2019-10-09 ENCOUNTER — Ambulatory Visit (INDEPENDENT_AMBULATORY_CARE_PROVIDER_SITE_OTHER): Payer: Medicare Other | Admitting: Podiatry

## 2019-10-09 DIAGNOSIS — G629 Polyneuropathy, unspecified: Secondary | ICD-10-CM

## 2019-10-09 DIAGNOSIS — Q828 Other specified congenital malformations of skin: Secondary | ICD-10-CM | POA: Diagnosis not present

## 2019-10-10 NOTE — Progress Notes (Signed)
Subjective: 79 year old female presents the office today due to painful calluses that she would have trimmed.  She is still on Lyrica 75 mg 3 times a day.  Patient said her feet are bothering her despite the calluses as well as the neuropathy.  She is can to be going to [place] to see her granddaughter graduate from medical school and she does have a catheter before she goes. Denies any systemic complaints such as fevers, chills, nausea, vomiting. No acute changes since last appointment, and no other complaints at this time.   Objective: AAO x3, NAD Hyperkeratotic lesions present left foot metatarsal area as well as fifth metatarsal base.  Also present on the right foot submetatarsal x1.  Upon debridement there is no ongoing ulceration drainage or any signs of infection. Multiple digital deformities are present which are unchanged.  Dorsal midfoot arthritic changes noted. No pain with calf compression, swelling, warmth, erythema  Assessment: Symptomatic callus formation bilaterally, neuropathy  Plan: -All treatment options discussed with the patient including all alternatives, risks, complications.  -Debrided hyperkeratotic lesions x3 without any complications or bleeding -Continue Lyrica as prescribed.  -Patient encouraged to call the office with any questions, concerns, change in symptoms.   Trula Slade DPM

## 2019-10-17 DIAGNOSIS — L814 Other melanin hyperpigmentation: Secondary | ICD-10-CM | POA: Diagnosis not present

## 2019-10-17 DIAGNOSIS — Z85820 Personal history of malignant melanoma of skin: Secondary | ICD-10-CM | POA: Diagnosis not present

## 2019-10-17 DIAGNOSIS — D225 Melanocytic nevi of trunk: Secondary | ICD-10-CM | POA: Diagnosis not present

## 2019-10-17 DIAGNOSIS — L578 Other skin changes due to chronic exposure to nonionizing radiation: Secondary | ICD-10-CM | POA: Diagnosis not present

## 2019-10-17 DIAGNOSIS — L821 Other seborrheic keratosis: Secondary | ICD-10-CM | POA: Diagnosis not present

## 2019-10-18 NOTE — Progress Notes (Deleted)
[facility] at [facility] [address], [place], [place] [address] [phone]  Date:  10/19/2019   Name:  Margaret [name]   DOB:  Aug 16, 1940   MRN:  [id number]  PCP:  Darreld Mclean, MD    Chief Complaint: No chief complaint on file.   History of Present Illness:  Margaret [name] is a 79 y.o. 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 Encounter for postoperative examination after surgery for malignant neoplasm 09/30/2018  . 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 Plantar fasciitis of right foot 02/03/2017  . Porokeratosis 02/03/2017  . Acute pancreatitis due to calculus of common bile duct 09/17/2016  . Acute gallstone pancreatitis   . Calculus of gallbladder with acute cholecystitis and obstruction   . LFT elevation   . Stress fracture 01/01/2016  . Insomnia 12/16/2015  . Peripheral neuropathy 10/18/2015  . Renal insufficiency 03/06/2015  . HTN (hypertension) 03/06/2015  . Osteopenia 03/06/2015  . GERD (gastroesophageal reflux disease) 03/06/2015  . Vaginal pessary present 03/06/2015    Past Medical History:  Diagnosis Date  . Arthritis   . History of healed stress fracture 2013   bilateral  feet  . Hx of phlebitis    reports remote hx of "superfical blood clots" never anticoagulated  . Hypertension   . Osteopenia     Past Surgical History:  Procedure Laterality Date  . CHOLECYSTECTOMY N/A 09/19/2016   Procedure: LAPAROSCOPIC CHOLECYSTECTOMY POSSIBLE INTRAOPERATIVE CHOLANGIOGRAM;  Surgeon: Rolm Bookbinder, MD;  Location: [facility];  Service: General;  Laterality: N/A;  . ERCP N/A 09/18/2016   Procedure: ENDOSCOPIC RETROGRADE CHOLANGIOPANCREATOGRAPHY (ERCP);  Surgeon: Doran Stabler, MD;  Location[facility] ENDOSCOPY;  Service: Endoscopy;  Laterality: N/A;  . FOOT SURGERY     patient reports four foot surgeries  . FOOT SURGERY Left    bunion, hammer toe surgery  . FOOT SURGERY Right    shaved bunion, hammer toe correction  . KNEE ARTHROSCOPY Right 1997  . REPLACEMENT TOTAL KNEE Left   . 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 RETINAL DETACHMENT SURGERY    . RETINAL DETACHMENT SURGERY Left 2008  . THYROIDECTOMY, PARTIAL Right   . THYROIDECTOMY, PARTIAL  2005    Social History   Tobacco Use  . Smoking status: Former Smoker    Quit date: 06/02/1979    Years since quitting: 40.4  . Smokeless tobacco: Never Used  Substance Use Topics  . Alcohol use: Yes    Alcohol/week: 1.0 standard drinks    Types: 1 Glasses of wine per week    Comment: Occ  . Drug use: No    Family History  Problem Relation Age of Onset  . Heart disease Mother   .  Heart attack Mother   . Heart disease Father   . Diabetes Maternal Grandmother   . Cancer Paternal Grandfather        stomach    Allergies  Allergen Reactions  . Keflex [Cephalexin] Diarrhea    Medication list has been reviewed and updated.  Current Outpatient Medications on File Prior to Visit  Medication Sig Dispense Refill  . acetaminophen (TYLENOL) 500 MG tablet Take by mouth.    Marland Kitchen acetaminophen-codeine (TYLENOL #3) 300-30 MG tablet Take 1 tablet by mouth every 4 (four) hours as needed for moderate pain. 25 tablet 0  .  Calcium Carbonate-Vitamin D (CALCIUM HIGH POTENCY/VITAMIN D) 600-200 MG-UNIT TABS Take by mouth.    . celecoxib (CELEBREX) 100 MG capsule TAKE 1 CAPSULE(100 MG) BY MOUTH TWICE DAILY AS NEEDED 60 capsule 3  . Cranberry (THERACRAN HP) 180 MG CAPS Take by mouth.    . docusate sodium (COLACE) 100 MG capsule Take by mouth.    Mariane Baumgarten Sodium (STOOL SOFTENER LAXATIVE PO) Take 1 tablet by mouth daily.    . hydrochlorothiazide (MICROZIDE) 12.5 MG capsule TAKE 1 CAPSULE(12.5 MG) BY MOUTH DAILY 30 capsule 6  . ketoconazole (NIZORAL) 2 % cream     . LORazepam (ATIVAN) 2 MG tablet TAKE 1-1&1/2 TABLETS BY MOUTH EVERY NIGHT AT BEDTIME AS NEEDED FOR SLEEP MAY TAKE 1 TABLET DURING DAY IF NEEDED 70 tablet 2  . metoprolol succinate (TOPROL-XL) 25 MG 24 hr tablet TAKE 1 TABLET(25 MG) BY MOUTH DAILY 90 tablet 1  . Multiple Vitamins-Minerals (WOMENS DAILY FORMULA) TABS Take 1 tablet by mouth daily.    . NONFORMULARY OR COMPOUNDED ITEM [facility]:  Peripheral Neuropathy - Bupivacaine 1%, Doxepin 3%, Gabapentin 6%, Pentoxifylline 3%, Topiramate 1%. Apply 1-2 grams to affected area 3-4 times daily. 100 each 11  . omeprazole (PRILOSEC) 20 MG capsule TAKE 1 CAPSULE BY MOUTH EVERY DAY AS NEEDED 90 capsule 3  . oxyCODONE-acetaminophen (PERCOCET/ROXICET) 5-325 MG tablet TK 1 T PO Q 4 H PRN FOR UP TO 5 DAYS    . pregabalin (LYRICA) 75 MG capsule Take 1 capsule (75 mg total) by mouth 3 (three) times daily. 90 capsule 5  . promethazine (PHENERGAN) 25 MG tablet Take 1 tablet (25 mg total) by mouth every 8 (eight) hours as needed for nausea or vomiting. 20 tablet 0   No current facility-administered medications on file prior to visit.    Review of Systems:  As per HPI- otherwise negative.   Physical Examination: There were no vitals filed for this visit. There were no vitals filed for this visit. There is no height or weight on file to calculate BMI. Ideal Body Weight:    GEN: no acute distress. HEENT:  Atraumatic, Normocephalic.  Ears and Nose: No external deformity. CV: RRR, No M/G/R. No JVD. No thrill. No extra heart sounds. PULM: CTA B, no wheezes, crackles, rhonchi. No retractions. No resp. distress. No accessory muscle use. ABD: S, NT, ND, +BS. No rebound. No HSM. EXTR: No c/c/e PSYCH: Normally interactive. Conversant.    Assessment and Plan: *** Moderate medical decision making today  This visit occurred during the SARS-CoV-2 public health emergency.  Safety protocols were in place, including screening questions prior to the visit, additional usage of staff PPE, and extensive cleaning of exam room while observing appropriate contact time as indicated for disinfecting solutions.    Signed Lamar Blinks, MD

## 2019-10-19 ENCOUNTER — Ambulatory Visit: Payer: Medicare Other | Admitting: Family Medicine

## 2019-10-19 ENCOUNTER — Other Ambulatory Visit: Payer: Self-pay

## 2019-10-30 ENCOUNTER — Encounter: Payer: Self-pay | Admitting: Family Medicine

## 2019-11-02 NOTE — Patient Instructions (Addendum)
DUE TO COVID-19 ONLY ONE VISITOR IS ALLOWED TO COME WITH YOU AND STAY IN THE WAITING ROOM ONLY DURING PRE OP AND PROCEDURE DAY OF SURGERY. THE 1 VISITOR MAY VISIT WITH YOU AFTER SURGERY IN YOUR PRIVATE ROOM DURING VISITING HOURS ONLY!  YOU NEED TO HAVE A COVID 19 TEST ON: 11/09/19 @ 12:00  , THIS TEST MUST BE DONE BEFORE SURGERY, COME  [address], [address] [place] , [address].  ([place]) ONCE YOUR COVID TEST IS COMPLETED, PLEASE BEGIN THE QUARANTINE INSTRUCTIONS AS OUTLINED IN YOUR HANDOUT.                Alphonzo Severance   Your procedure is scheduled on: 11/13/19   Report to [facility] Main  Entrance   Report to admitting at: 6:00 AM     Call this number if you have problems the morning of surgery [phone]    Remember:   NO SOLID FOOD AFTER MIDNIGHT THE NIGHT PRIOR TO SURGERY. NOTHING BY MOUTH EXCEPT CLEAR LIQUIDS UNTIL: 5:30 am . PLEASE FINISH ENSURE DRINK PER SURGEON ORDER  WHICH NEEDS TO BE COMPLETED AT: 5:30 am .   CLEAR LIQUID DIET   Foods Allowed                                                                     Foods Excluded  Coffee and tea, regular and decaf                             liquids that you cannot  Plain Jell-O any favor except red or purple                                           see through such as: Fruit ices (not with fruit pulp)                                     milk, soups, orange juice  Iced Popsicles                                    All solid food Carbonated beverages, regular and diet                                    Cranberry, grape and apple juices Sports drinks like Gatorade Lightly seasoned clear broth or consume(fat free) Sugar, honey syrup  Sample Menu Breakfast                                Lunch                                     Supper Cranberry juice  Beef broth                            Chicken broth Jell-O                                     Grape juice                            Apple juice Coffee or tea                        Jell-O                                      Popsicle                                                Coffee or tea                        Coffee or tea  _____________________________________________________________________  BRUSH YOUR TEETH MORNING OF SURGERY AND RINSE YOUR MOUTH OUT, NO CHEWING GUM CANDY OR MINTS.     Take these medicines the morning of surgery with A SIP OF WATER: metoprolol,omeprazole,pregabalin.                                 You may not have any metal on your body including hair pins and              piercings  Do not wear jewelry, make-up, lotions, powders or perfumes, deodorant             Do not wear nail polish on your fingernails.  Do not shave  48 hours prior to surgery.     Do not bring valuables to the hospital. [place].  Contacts, dentures or bridgework may not be worn into surgery.  Leave suitcase in the car. After surgery it may be brought to your room.     Patients discharged the day of surgery will not be allowed to drive home. IF YOU ARE HAVING SURGERY AND GOING HOME THE SAME DAY, YOU MUST HAVE AN ADULT TO DRIVE YOU HOME AND BE WITH YOU FOR 24 HOURS. YOU MAY GO HOME BY TAXI OR UBER OR ORTHERWISE, BUT AN ADULT MUST ACCOMPANY YOU HOME AND STAY WITH YOU FOR 24 HOURS.  Name and phone number of your driver:  Special Instructions: N/A              Please read over the following fact sheets you were given: _____________________________________________________________________  [facility] - Preparing for Surgery Before surgery, you can play an important role.  Because skin is not sterile, your skin needs to be as free of germs as possible.  You can reduce the number of germs on your skin by washing with CHG (chlorahexidine gluconate) soap before surgery.  CHG is an antiseptic cleaner which kills germs  and bonds with the skin to continue killing germs  even after washing. Please DO NOT use if you have an allergy to CHG or antibacterial soaps.  If your skin becomes reddened/irritated stop using the CHG and inform your nurse when you arrive at Short Stay. Do not shave (including legs and underarms) for at least 48 hours prior to the first CHG shower.  You may shave your face/neck. Please follow these instructions carefully:  1.  Shower with CHG Soap the night before surgery and the  morning of Surgery.  2.  If you choose to wash your hair, wash your hair first as usual with your  normal  shampoo.  3.  After you shampoo, rinse your hair and body thoroughly to remove the  shampoo.                           4.  Use CHG as you would any other liquid soap.  You can apply chg directly  to the skin and wash                       Gently with a scrungie or clean washcloth.  5.  Apply the CHG Soap to your body ONLY FROM THE NECK DOWN.   Do not use on face/ open                           Wound or open sores. Avoid contact with eyes, ears mouth and genitals (private parts).                       Wash face,  Genitals (private parts) with your normal soap.             6.  Wash thoroughly, paying special attention to the area where your surgery  will be performed.  7.  Thoroughly rinse your body with warm water from the neck down.  8.  DO NOT shower/wash with your normal soap after using and rinsing off  the CHG Soap.                9.  Pat yourself dry with a clean towel.            10.  Wear clean pajamas.            11.  Place clean sheets on your bed the night of your first shower and do not  sleep with pets. Day of Surgery : Do not apply any lotions/deodorants the morning of surgery.  Please wear clean clothes to the hospital/surgery center.  FAILURE TO FOLLOW THESE INSTRUCTIONS MAY RESULT IN THE CANCELLATION OF YOUR SURGERY PATIENT SIGNATURE_________________________________  NURSE  SIGNATURE__________________________________  ________________________________________________________________________   Adam Phenix  An incentive spirometer is a tool that can help keep your lungs clear and active. This tool measures how well you are filling your lungs with each breath. Taking long deep breaths may help reverse or decrease the chance of developing breathing (pulmonary) problems (especially infection) following:  A long period of time when you are unable to move or be active. BEFORE THE PROCEDURE   If the spirometer includes an indicator to show your best effort, your nurse or respiratory therapist will set it to a desired goal.  If possible, sit up straight or lean slightly forward. Try not to slouch.  Hold the incentive spirometer in an upright  position. INSTRUCTIONS FOR USE  1. Sit on the edge of your bed if possible, or sit up as far as you can in bed or on a chair. 2. Hold the incentive spirometer in an upright position. 3. Breathe out normally. 4. Place the mouthpiece in your mouth and seal your lips tightly around it. 5. Breathe in slowly and as deeply as possible, raising the piston or the ball toward the top of the column. 6. Hold your breath for 3-5 seconds or for as long as possible. Allow the piston or ball to fall to the bottom of the column. 7. Remove the mouthpiece from your mouth and breathe out normally. 8. Rest for a few seconds and repeat Steps 1 through 7 at least 10 times every 1-2 hours when you are awake. Take your time and take a few normal breaths between deep breaths. 9. The spirometer may include an indicator to show your best effort. Use the indicator as a goal to work toward during each repetition. 10. After each set of 10 deep breaths, practice coughing to be sure your lungs are clear. If you have an incision (the cut made at the time of surgery), support your incision when coughing by placing a pillow or rolled up towels firmly  against it. Once you are able to get out of bed, walk around indoors and cough well. You may stop using the incentive spirometer when instructed by your caregiver.  RISKS AND COMPLICATIONS  Take your time so you do not get dizzy or light-headed.  If you are in pain, you may need to take or ask for pain medication before doing incentive spirometry. It is harder to take a deep breath if you are having pain. AFTER USE  Rest and breathe slowly and easily.  It can be helpful to keep track of a log of your progress. Your caregiver can provide you with a simple table to help with this. If you are using the spirometer at home, follow these instructions: [place] IF:   You are having difficultly using the spirometer.  You have trouble using the spirometer as often as instructed.  Your pain medication is not giving enough relief while using the spirometer.  You develop fever of 100.5 F (38.1 C) or higher. SEEK IMMEDIATE MEDICAL CARE IF:   You cough up bloody sputum that had not been present before.  You develop fever of 102 F (38.9 C) or greater.  You develop worsening pain at or near the incision site. MAKE SURE YOU:   Understand these instructions.  Will watch your condition.  Will get help right away if you are not doing well or get worse. Document Released: 09/28/2006 Document Revised: 08/10/2011 Document Reviewed: 11/29/2006 [facility] Patient Information 2014 [place], [place].   ________________________________________________________________________

## 2019-11-03 ENCOUNTER — Other Ambulatory Visit: Payer: Self-pay

## 2019-11-03 ENCOUNTER — Other Ambulatory Visit: Payer: Self-pay | Admitting: Podiatry

## 2019-11-03 ENCOUNTER — Encounter (HOSPITAL_COMMUNITY): Payer: Self-pay

## 2019-11-03 ENCOUNTER — Encounter (HOSPITAL_COMMUNITY)
Admission: RE | Admit: 2019-11-03 | Discharge: 2019-11-03 | Disposition: A | Discharge status: Home / Self Care | Payer: Medicare Other | Source: Ambulatory Visit | Attending: Orthopedic Surgery | Admitting: Orthopedic Surgery

## 2019-11-03 DIAGNOSIS — I10 Essential (primary) hypertension: Secondary | ICD-10-CM | POA: Diagnosis not present

## 2019-11-03 DIAGNOSIS — Z87891 Personal history of nicotine dependence: Secondary | ICD-10-CM | POA: Diagnosis not present

## 2019-11-03 DIAGNOSIS — Z01818 Encounter for other preprocedural examination: Secondary | ICD-10-CM | POA: Insufficient documentation

## 2019-11-03 DIAGNOSIS — Z79899 Other long term (current) drug therapy: Secondary | ICD-10-CM | POA: Diagnosis not present

## 2019-11-03 DIAGNOSIS — R9431 Abnormal electrocardiogram [ECG] [EKG]: Secondary | ICD-10-CM | POA: Diagnosis not present

## 2019-11-03 DIAGNOSIS — K219 Gastro-esophageal reflux disease without esophagitis: Secondary | ICD-10-CM | POA: Insufficient documentation

## 2019-11-03 HISTORY — DX: Anxiety disorder, unspecified: F41.9

## 2019-11-03 HISTORY — DX: Malignant (primary) neoplasm, unspecified: C80.1

## 2019-11-03 HISTORY — DX: Chronic kidney disease, unspecified: N18.9

## 2019-11-03 MED ORDER — PREGABALIN 75 MG PO CAPS: 75.0000 mg | ORAL_CAPSULE | Freq: Three times a day (TID) | ORAL | 5 refills | Status: DC

## 2019-11-03 NOTE — Progress Notes (Signed)
COVID Vaccine Completed: yes Date COVID Vaccine completed: 08/2019 COVID vaccine manufacturer: [place]   PCP - Janett Billow Copland. LOV: 10/09/19 Cardiologist -   Chest x-ray -  EKG -  Stress Test -  ECHO -  Cardiac Cath -   Sleep Study -  CPAP -   Fasting Blood Sugar -  Checks Blood Sugar _____ times a day  Blood Thinner Instructions: Aspirin Instructions: Last Dose:  Anesthesia review:   Patient denies shortness of breath, fever, cough and chest pain at PAT appointment   Patient verbalized understanding of instructions that were given to them at the PAT appointment. Patient was also instructed that they will need to review over the PAT instructions again at home before surgery.

## 2019-11-04 NOTE — Progress Notes (Signed)
[facility] at [facility], [place], [place] [address] [phone]  Date:  11/06/2019   Name:  Margaret [name]   DOB:  07-27-1940   MRN:  [id number]  PCP:  Darreld Mclean, MD    Chief Complaint: Medical Clearance and Medication Refill (lorazepam)   History of Present Illness:  Margaret [name] is a 79 y.o. very pleasant female patient who presents with the following:  Here today for preoperative evaluation-she plans to have a RIGHT total knee in about 1 week Her left knee was replaced 7 years ago with good results Accompanied by a friend today  Patient with history of hypertension, GERD, osteopenia, melanoma of left foot, renal insufficiency  Most recent EKG on chart from about 2 years ago, need to update today-however, according to [place] and her friend she is actually scheduled to appear at the hospital after lunch today, they will do EKG and labs at that time  She needs a refill of her lorazepam today  Exercise is somewhat limited by her current knee pain, but patient is able to do activities such as vacuuming, sweeping, and carrying groceries with no chest pain or shortness of breath No history of CAD or MI Non-smoker  Patient Active Problem List   Diagnosis Date Noted   Encounter for postoperative examination after surgery for malignant neoplasm 09/30/2018   Malignant melanoma of left foot ([place]) 08/15/2018   Plantar fasciitis of right foot 02/03/2017   Porokeratosis 02/03/2017   Acute pancreatitis due to calculus of common bile duct 09/17/2016   Acute gallstone pancreatitis    Calculus of gallbladder with acute cholecystitis and obstruction    LFT elevation    Stress fracture 01/01/2016   Insomnia 12/16/2015   Peripheral neuropathy 10/18/2015   Renal insufficiency 03/06/2015   HTN (hypertension) 03/06/2015   Osteopenia 03/06/2015   GERD (gastroesophageal reflux disease) 03/06/2015    Vaginal pessary present 03/06/2015    Past Medical History:  Diagnosis Date   Anxiety    Arthritis    Cancer ([place])    lt. foot melanoma   Chronic kidney disease    lt. kidney cyst   History of healed stress fracture 2013   bilateral feet   Hx of phlebitis    reports remote hx of "superfical blood clots" never anticoagulated   Hypertension    Osteopenia     Past Surgical History:  Procedure Laterality Date   CHOLECYSTECTOMY N/A 09/19/2016   Procedure: LAPAROSCOPIC CHOLECYSTECTOMY POSSIBLE INTRAOPERATIVE CHOLANGIOGRAM;  Surgeon: Rolm Bookbinder, MD;  Location: [facility] OR;  Service: General;  Laterality: N/A;   ERCP N/A 09/18/2016   Procedure: ENDOSCOPIC RETROGRADE CHOLANGIOPANCREATOGRAPHY (ERCP);  Surgeon: Doran Stabler, MD;  Location: [facility] ENDOSCOPY;  Service: Endoscopy;  Laterality: N/A;   FOOT SURGERY     patient reports four foot surgeries   FOOT SURGERY Left    bunion, hammer toe surgery   FOOT SURGERY Right    shaved bunion, hammer toe correction   KNEE ARTHROSCOPY Right 1997   REPLACEMENT TOTAL KNEE Left    REPLACEMENT TOTAL KNEE Left 10/2012   RETINAL DETACHMENT SURGERY     RETINAL DETACHMENT SURGERY Left 2008   THYROIDECTOMY, PARTIAL Right    THYROIDECTOMY, PARTIAL  2005    Social History   Tobacco Use   Smoking status: Former Smoker    Quit date: 06/02/1979    Years since quitting: 40.4   Smokeless tobacco: Never Used  Substance Use Topics   Alcohol use: Yes    Alcohol/week: 1.0 standard drinks    Types: 1 Glasses of wine per week    Comment: Occ   Drug use: No    Family History  Problem Relation Age of Onset   Heart disease Mother    Heart attack Mother    Heart disease Father    Diabetes Maternal Grandmother    Cancer Paternal Grandfather        stomach    Allergies  Allergen Reactions   Keflex [Cephalexin] Diarrhea   Percocet [Oxycodone-Acetaminophen] Other (See Comments)    Syncope (passed out)     Medication list has been reviewed and updated.  Current Outpatient Medications on File Prior to Visit  Medication Sig Dispense Refill   acetaminophen (TYLENOL) 500 MG tablet Take 1,000 mg by mouth every 6 (six) hours as needed (pain.).      Calcium Carbonate-Vitamin D (CALCIUM HIGH POTENCY/VITAMIN D) 600-200 MG-UNIT TABS Take 1 tablet by mouth in the morning and at bedtime.      CRANBERRY PO Take 650 mg by mouth daily.     diclofenac Sodium (VOLTAREN) 1 % GEL Apply 1 application topically 4 (four) times daily as needed (knee pain.).     hydrochlorothiazide (MICROZIDE) 12.5 MG capsule TAKE 1 CAPSULE(12.5 MG) BY MOUTH DAILY (Patient taking differently: Take 12.5 mg by mouth daily. ) 30 capsule 6   ketoconazole (NIZORAL) 2 % cream Apply 1 application topically 2 (two) times daily as needed (skin irritation/rash.).      metoprolol succinate (TOPROL-XL) 25 MG 24 hr tablet TAKE 1 TABLET(25 MG) BY MOUTH DAILY (Patient taking differently: Take 25 mg by mouth daily. ) 90 tablet 1   Multiple Vitamin (MULTIVITAMIN WITH MINERALS) TABS tablet Take 1 tablet by mouth daily.     NONFORMULARY OR COMPOUNDED ITEM [facility]:  Peripheral Neuropathy - Bupivacaine 1%, Doxepin 3%, Gabapentin 6%, Pentoxifylline 3%, Topiramate 1%. Apply 1-2 grams to affected area 3-4 times daily. (Patient taking differently: Apply 1 application topically 4 (four) times daily as needed (neuropathy/pain (feet)). [facility]:  Peripheral Neuropathy - Bupivacaine 1%, Doxepin 3%, Gabapentin 6%, Pentoxifylline 3%, Topiramate 1%. Apply 1-2 grams to affected area 3-4 times daily.) 100 each 11   omeprazole (PRILOSEC) 20 MG capsule TAKE 1 CAPSULE BY MOUTH EVERY DAY AS NEEDED (Patient taking differently: Take 20 mg by mouth daily. ) 90 capsule 3   pregabalin (LYRICA) 75 MG capsule Take 1 capsule (75 mg total) by mouth 3 (three) times daily. 90 capsule 5   No current facility-administered medications on file prior to  visit.    Review of Systems:  As per HPI- otherwise negative.   Physical Examination: Vitals:   11/06/19 1124  BP: 128/72  Pulse: 63  Resp: 16  Temp: (!) 97.5 F (36.4 C)  SpO2: 99%   Vitals:   11/06/19 1124  Weight: 169 lb (76.7 kg)  Height: 5\' 1"  (1.549 m)   Body mass index is 31.93 kg/m. Ideal Body Weight: Weight in (lb) to have BMI = 25: 132  GEN: no acute distress.  Overweight, looks well HEENT: Atraumatic, Normocephalic.  Ears and Nose: No external deformity. CV: RRR, No M/G/R. No JVD. No thrill. No extra heart sounds. PULM: CTA B, no wheezes, crackles, rhonchi. No retractions. No resp. distress. No accessory muscle use. ABD: S, NT, ND, +BS. EXTR: No c/c/e PSYCH: Normally interactive. Conversant.    Assessment and Plan: Preoperative clearance  Adjustment insomnia - Plan: LORazepam (  ATIVAN) 2 MG tablet  Here today for preoperative clearance.  Also refilled her lorazepam.  I had planned to do labs and EKG, but the patient reports she has an appointment at the hospital after lunch today for the services.  I also encouraged her to get a tetanus vaccine at her drugstore her convenience  This visit occurred during the SARS-CoV-2 public health emergency.  Safety protocols were in place, including screening questions prior to the visit, additional usage of staff PPE, and extensive cleaning of exam room while observing appropriate contact time as indicated for disinfecting solutions.     Signed Lamar Blinks, MD

## 2019-11-06 ENCOUNTER — Other Ambulatory Visit: Payer: Self-pay

## 2019-11-06 ENCOUNTER — Ambulatory Visit (INDEPENDENT_AMBULATORY_CARE_PROVIDER_SITE_OTHER): Payer: Medicare Other | Admitting: Family Medicine

## 2019-11-06 ENCOUNTER — Encounter (HOSPITAL_COMMUNITY)
Admission: RE | Admit: 2019-11-06 | Discharge: 2019-11-06 | Disposition: A | Discharge status: Home / Self Care | Payer: Medicare Other | Source: Ambulatory Visit | Attending: Orthopedic Surgery | Admitting: Orthopedic Surgery

## 2019-11-06 ENCOUNTER — Encounter: Payer: Self-pay | Admitting: Family Medicine

## 2019-11-06 VITALS — BP 128/72 | HR 63 | Temp 97.5°F | Resp 16 | Ht 61.0 in | Wt 169.0 lb

## 2019-11-06 DIAGNOSIS — Z01818 Encounter for other preprocedural examination: Secondary | ICD-10-CM

## 2019-11-06 DIAGNOSIS — K219 Gastro-esophageal reflux disease without esophagitis: Secondary | ICD-10-CM | POA: Diagnosis not present

## 2019-11-06 DIAGNOSIS — F5102 Adjustment insomnia: Secondary | ICD-10-CM

## 2019-11-06 DIAGNOSIS — R9431 Abnormal electrocardiogram [ECG] [EKG]: Secondary | ICD-10-CM | POA: Diagnosis not present

## 2019-11-06 DIAGNOSIS — Z79899 Other long term (current) drug therapy: Secondary | ICD-10-CM | POA: Diagnosis not present

## 2019-11-06 DIAGNOSIS — I10 Essential (primary) hypertension: Secondary | ICD-10-CM | POA: Diagnosis not present

## 2019-11-06 DIAGNOSIS — Z87891 Personal history of nicotine dependence: Secondary | ICD-10-CM | POA: Diagnosis not present

## 2019-11-06 LAB — COMPREHENSIVE METABOLIC PANEL
ALT: 16 U/L (ref 0–44)
AST: 22 U/L (ref 15–41)
Albumin: 3.8 g/dL (ref 3.5–5.0)
Alkaline Phosphatase: 90 U/L (ref 38–126)
Anion gap: 8 (ref 5–15)
BUN: 25 mg/dL — ABNORMAL HIGH (ref 8–23)
CO2: 27 mmol/L (ref 22–32)
Calcium: 8.9 mg/dL (ref 8.9–10.3)
Chloride: 106 mmol/L (ref 98–111)
Creatinine, Ser: 1.06 mg/dL — ABNORMAL HIGH (ref 0.44–1.00)
GFR calc Af Amer: 58 mL/min — ABNORMAL LOW (ref >60)
GFR calc non Af Amer: 50 mL/min — ABNORMAL LOW (ref >60)
Glucose, Bld: 148 mg/dL — ABNORMAL HIGH (ref 70–99)
Potassium: 3.7 mmol/L (ref 3.5–5.1)
Sodium: 141 mmol/L (ref 135–145)
Total Bilirubin: 0.6 mg/dL (ref 0.3–1.2)
Total Protein: 6.9 g/dL (ref 6.5–8.1)

## 2019-11-06 LAB — CBC
HCT: 41.5 % (ref 36.0–46.0)
Hemoglobin: 13.3 g/dL (ref 12.0–15.0)
MCH: 32.1 pg (ref 26.0–34.0)
MCHC: 32 g/dL (ref 30.0–36.0)
MCV: 100.2 fL — ABNORMAL HIGH (ref 80.0–100.0)
Platelets: 231 10*3/uL (ref 150–400)
RBC: 4.14 MIL/uL (ref 3.87–5.11)
RDW: 13.7 % (ref 11.5–15.5)
WBC: 5.5 10*3/uL (ref 4.0–10.5)
nRBC: 0 % (ref 0.0–0.2)

## 2019-11-06 LAB — APTT: aPTT: 26 seconds (ref 24–36)

## 2019-11-06 LAB — PROTIME-INR
INR: 0.9 (ref 0.8–1.2)
Prothrombin Time: 11.7 seconds (ref 11.4–15.2)

## 2019-11-06 LAB — SURGICAL PCR SCREEN
MRSA, PCR: NEGATIVE
Staphylococcus aureus: NEGATIVE

## 2019-11-06 LAB — ABO/RH: ABO/RH(D): O POS

## 2019-11-06 MED ORDER — LORAZEPAM 2 MG PO TABS: ORAL_TABLET | ORAL | 2 refills | Status: DC

## 2019-11-06 NOTE — Patient Instructions (Signed)
It was great to see you today- best of luck with your upcoming operation!  Please see me in the fall for a routine physical You are due for a tetanus booster at your convenience

## 2019-11-07 ENCOUNTER — Ambulatory Visit (INDEPENDENT_AMBULATORY_CARE_PROVIDER_SITE_OTHER): Payer: Medicare Other | Admitting: Podiatry

## 2019-11-07 DIAGNOSIS — Q828 Other specified congenital malformations of skin: Secondary | ICD-10-CM

## 2019-11-07 DIAGNOSIS — G8929 Other chronic pain: Secondary | ICD-10-CM

## 2019-11-07 DIAGNOSIS — G629 Polyneuropathy, unspecified: Secondary | ICD-10-CM | POA: Diagnosis not present

## 2019-11-07 DIAGNOSIS — M79673 Pain in unspecified foot: Secondary | ICD-10-CM | POA: Diagnosis not present

## 2019-11-07 MED ORDER — PREGABALIN 75 MG PO CAPS: 75.0000 mg | ORAL_CAPSULE | Freq: Three times a day (TID) | ORAL | 5 refills | Status: DC

## 2019-11-08 NOTE — H&P (Signed)
TOTAL KNEE ADMISSION H&P  Patient is being admitted for right total knee arthroplasty.  Subjective:  Chief Complaint: Right knee pain.  HPI: Margaret [name], 79 y.o. female has a history of pain and functional disability in the right knee due to arthritis and has failed non-surgical conservative treatments for greater than 12 weeks to include corticosteriod injections and activity modification. Onset of symptoms was gradual, starting >10 years ago with gradually worsening course since that time. The patient noted prior procedures on the knee to include  arthroscopy and menisectomy on the right knee.  Patient currently rates pain in the right knee at 8 out of 10 with activity. Patient has worsening of pain with activity and weight bearing, pain that interferes with activities of daily living, pain with passive range of motion and crepitus. Patient has evidence of bone-on-bone arthritis in the lateral and patellofemoral compartments and near bone-on-bone in the medial compartment. She has massive osteophytes throughout the knee by imaging studies. There is no active infection.  Patient Active Problem List   Diagnosis Date Noted  . Encounter for postoperative examination after surgery for malignant neoplasm 09/30/2018  . Malignant melanoma of left foot ([place]) 08/15/2018  . Plantar fasciitis of right foot 02/03/2017  . Porokeratosis 02/03/2017  . Acute pancreatitis due to calculus of common bile duct 09/17/2016  . Acute gallstone pancreatitis   . Calculus of gallbladder with acute cholecystitis and obstruction   . LFT elevation   . Stress fracture 01/01/2016  . Insomnia 12/16/2015  . Peripheral neuropathy 10/18/2015  . Renal insufficiency 03/06/2015  . HTN (hypertension) 03/06/2015  . Osteopenia 03/06/2015  . GERD (gastroesophageal reflux disease) 03/06/2015  . Vaginal pessary present 03/06/2015    Past Medical History:  Diagnosis Date  . Anxiety   . Arthritis   . Cancer ([facility])    lt.  foot melanoma  . Chronic kidney disease    lt. kidney cyst  . History of healed stress fracture 2013   bilateral feet  . Hx of phlebitis    reports remote hx of "superfical blood clots" never anticoagulated  . Hypertension   . Osteopenia     Past Surgical History:  Procedure Laterality Date  . CHOLECYSTECTOMY N/A 09/19/2016   Procedure: LAPAROSCOPIC CHOLECYSTECTOMY POSSIBLE INTRAOPERATIVE CHOLANGIOGRAM;  Surgeon: Rolm Bookbinder, MD;  Location: [place];  Service: General;  Laterality: N/A;  . ERCP N/A 09/18/2016   Procedure: ENDOSCOPIC RETROGRADE CHOLANGIOPANCREATOGRAPHY (ERCP);  Surgeon: Doran Stabler, MD;  Location: [facility] ENDOSCOPY;  Service: Endoscopy;  Laterality: N/A;  . FOOT SURGERY     patient reports four foot surgeries  . FOOT SURGERY Left    bunion, hammer toe surgery  . FOOT SURGERY Right    shaved bunion, hammer toe correction  . KNEE ARTHROSCOPY Right 1997  . REPLACEMENT TOTAL KNEE Left   . 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 RETINAL DETACHMENT SURGERY    . RETINAL DETACHMENT SURGERY Left 2008  . THYROIDECTOMY, PARTIAL Right   . THYROIDECTOMY, PARTIAL  2005    Prior to Admission medications   Medication Sig Start Date End Date Taking? Authorizing Provider  acetaminophen (TYLENOL) 500 MG tablet Take 1,000 mg by mouth every 6 (six) hours as needed (pain.).    Yes [provider]  Calcium Carbonate-Vitamin D (CALCIUM HIGH POTENCY/VITAMIN D) 600-200 MG-UNIT TABS Take 1 tablet by mouth in the morning and at bedtime.  12/22/07  Yes [provider]  CRANBERRY PO Take 650 mg by mouth daily.  Yes [provider]  diclofenac Sodium (VOLTAREN) 1 % GEL Apply 1 application topically 4 (four) times daily as needed (knee pain.).   Yes [provider]  hydrochlorothiazide (MICROZIDE) 12.5 MG capsule TAKE 1 CAPSULE(12.5 MG) BY MOUTH DAILY Patient taking differently: Take 12.5 mg by mouth daily.  08/03/19  Yes Copland, Gay Filler, MD  ketoconazole  (NIZORAL) 2 % cream Apply 1 application topically 2 (two) times daily as needed (skin irritation/rash.).  07/18/18  Yes [provider]  metoprolol succinate (TOPROL-XL) 25 MG 24 hr tablet TAKE 1 TABLET(25 MG) BY MOUTH DAILY Patient taking differently: Take 25 mg by mouth daily.  08/03/19  Yes Copland, Gay Filler, MD  Multiple Vitamin (MULTIVITAMIN WITH MINERALS) TABS tablet Take 1 tablet by mouth daily.   Yes [provider]  NONFORMULARY OR COMPOUNDED ITEM [facility]:  Peripheral Neuropathy - Bupivacaine 1%, Doxepin 3%, Gabapentin 6%, Pentoxifylline 3%, Topiramate 1%. Apply 1-2 grams to affected area 3-4 times daily. Patient taking differently: Apply 1 application topically 4 (four) times daily as needed (neuropathy/pain (feet)). [facility]:  Peripheral Neuropathy - Bupivacaine 1%, Doxepin 3%, Gabapentin 6%, Pentoxifylline 3%, Topiramate 1%. Apply 1-2 grams to affected area 3-4 times daily. 01/24/19  Yes Trula Slade, DPM  omeprazole (PRILOSEC) 20 MG capsule TAKE 1 CAPSULE BY MOUTH EVERY DAY AS NEEDED Patient taking differently: Take 20 mg by mouth daily.  04/17/19  Yes Copland, Gay Filler, MD  LORazepam (ATIVAN) 2 MG tablet TAKE 1-1&1/2 TABLETS BY MOUTH EVERY NIGHT AT BEDTIME AS NEEDED FOR SLEEP MAY TAKE 1 TABLET DURING DAY IF NEEDED 11/06/19   Copland, Gay Filler, MD  pregabalin (LYRICA) 75 MG capsule Take 1 capsule (75 mg total) by mouth 3 (three) times daily. 11/07/19   Trula Slade, DPM    Allergies  Allergen Reactions  . Keflex [Cephalexin] Diarrhea  . Percocet [Oxycodone-Acetaminophen] Other (See Comments)    Syncope (passed out)    Social History   Socioeconomic History  . Marital status: Widowed    Spouse name: Not on file  . Number of children: Not on file  . Years of education: Not on file  . Highest education level: Not on file  Occupational History  . Not on file  Tobacco Use  . Smoking status: Former Smoker    Quit date: 06/02/1979     Years since quitting: 40.4  . Smokeless tobacco: Never Used  Substance and Sexual Activity  . Alcohol use: Yes    Alcohol/week: 1.0 standard drinks    Types: 1 Glasses of wine per week    Comment: Occ  . Drug use: No  . Sexual activity: Not Currently  Other Topics Concern  . 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 Difficulty of Paying Living Expenses:   Food Insecurity:   . Worried About Charity fundraiser in the Last Year:   . Arboriculturist in the Last Year:   Transportation Needs:   . Film/video editor (Medical):   Marland Kitchen Lack of Transportation (Non-Medical):   Physical Activity:   . Days of Exercise per Week:   . Minutes of Exercise per Session:   Stress:   .  Feeling of Stress :   Social Connections:   . Frequency of Communication with Friends and Family:   . Frequency of Social Gatherings with Friends and Family:   . Attends Religious Services:   . Active Member of Clubs or Organizations:   . Attends Archivist Meetings:   Marland Kitchen Marital Status:   Intimate Partner Violence:   . Fear of Current or Ex-Partner:   . Emotionally Abused:   Marland Kitchen Physically Abused:   . Sexually Abused:       Tobacco Use: Medium Risk  . Smoking Tobacco Use: Former Smoker  . Smokeless Tobacco Use: Never Used   Social History   Substance and Sexual Activity  Alcohol Use Yes  . Alcohol/week: 1.0 standard drinks  . Types: 1 Glasses of wine per week   Comment: Occ    Family History  Problem Relation Age of Onset  . Heart disease Mother   . Heart attack Mother   . Heart disease Father   . Diabetes Maternal Grandmother   . Cancer Paternal Grandfather        stomach    Review of Systems  Constitutional:  Negative for chills and fever.  HENT: Negative for congestion, sore throat and tinnitus.   Eyes: Negative for double vision, photophobia and pain.  Respiratory: Negative for cough, shortness of breath and wheezing.   Cardiovascular: Negative for chest pain, palpitations and orthopnea.  Gastrointestinal: Negative for heartburn, nausea and vomiting.  Genitourinary: Negative for dysuria, frequency and urgency.  Musculoskeletal: Positive for joint pain.  Neurological: Negative for dizziness, weakness and headaches.    Objective:  Physical Exam: Well nourished and well developed.  General: Alert and oriented x3, cooperative and pleasant, no acute distress.  Head: normocephalic, atraumatic, neck supple.  Eyes: EOMI.  Respiratory: breath sounds clear in all fields, no wheezing, rales, or rhonchi. Cardiovascular: Regular rate and rhythm, no murmurs, gallops or rubs.  Abdomen: non-tender to palpation and soft, normoactive bowel sounds. Musculoskeletal:  Right Knee Exam:  No effusion present. No swelling present. The range of motion is: 5 to 125 degrees.  Marked crepitus on range of motion of the knee.  Lateral greater than medial joint line tenderness.  The knee is stable.  Calves soft and nontender. Motor function intact in LE. Strength 5/5 LE bilaterally. Neuro: Distal pulses 2+. Sensation to light touch intact in LE.  Imaging Review Plain radiographs demonstrate severe degenerative joint disease of the right knee. The overall alignment is neutral. The bone quality appears to be adequate for age and reported activity level.  Assessment/Plan:  End stage arthritis, right knee   The patient history, physical examination, clinical judgment of the provider and imaging studies are consistent with end stage degenerative joint disease of the right knee and total knee arthroplasty is deemed medically necessary. The treatment options including medical management, injection therapy arthroscopy  and arthroplasty were discussed at length. The risks and benefits of total knee arthroplasty were presented and reviewed. The risks due to aseptic loosening, infection, stiffness, patella tracking problems, thromboembolic complications and other imponderables were discussed. The patient acknowledged the explanation, agreed to proceed with the plan and consent was signed. Patient is being admitted for inpatient treatment for surgery, pain control, PT, OT, prophylactic antibiotics, VTE prophylaxis, progressive ambulation and ADLs and discharge planning. The patient is planning to be discharged home.   Patient's anticipated LOS is less than 2 midnights, meeting these requirements: - Younger than 64 - Lives within 1 hour of care -  Has a competent adult at home to recover with post-op recover - NO history of  - Chronic pain requiring opiods  - Diabetes  - Coronary Artery Disease  - Heart failure  - Heart attack  - Stroke  - DVT/VTE  - Cardiac arrhythmia  - Respiratory Failure/COPD  - Renal failure  - Anemia  - Advanced Liver disease  Therapy Plans: Outpatient therapy at [facility]) Disposition: Home with daughter Planned DVT Prophylaxis: Xarelto 10 mg QD (hx melanoma) DME Needed: None PCP: Lamar Blinks, MD (appt on 6/7) TXA: IV Allergies: Percocet (fainting) Anesthesia Concerns: None BMI: 32.6  Other:  - SDD  - Patient was instructed on what medications to stop prior to surgery. - Follow-up visit in 2 weeks with Dr. Wynelle Link - Begin physical therapy following surgery - Pre-operative lab work as pre-surgical testing - Prescriptions will be provided in hospital at time of discharge  Theresa Duty, PA-C Orthopedic Surgery [facility] [place]

## 2019-11-09 ENCOUNTER — Other Ambulatory Visit (HOSPITAL_COMMUNITY)
Admission: RE | Admit: 2019-11-09 | Discharge: 2019-11-09 | Disposition: A | Discharge status: Home / Self Care | Payer: Medicare Other | Source: Ambulatory Visit | Attending: Orthopedic Surgery | Admitting: Orthopedic Surgery

## 2019-11-09 DIAGNOSIS — Z20822 Contact with and (suspected) exposure to covid-19: Secondary | ICD-10-CM | POA: Insufficient documentation

## 2019-11-09 DIAGNOSIS — Z01812 Encounter for preprocedural laboratory examination: Secondary | ICD-10-CM | POA: Insufficient documentation

## 2019-11-09 LAB — SARS CORONAVIRUS 2 (TAT 6-24 HRS): SARS Coronavirus 2: NEGATIVE

## 2019-11-10 ENCOUNTER — Encounter: Payer: Self-pay | Admitting: Podiatry

## 2019-11-10 NOTE — Progress Notes (Signed)
Pt. Was informed about the time change for surgery.Pt. will drink ensure at: 4:15 am.Also pt. Needs to be at short stay at 5:30 am

## 2019-11-12 MED ORDER — BUPIVACAINE LIPOSOME 1.3 % IJ SUSP
20.0000 mL | INTRAMUSCULAR | Status: DC
  Filled 2019-11-12: qty 20

## 2019-11-12 NOTE — Progress Notes (Signed)
Subjective: 79 year old female presents the office today due to painful calluses that she would have trimmed.  She is still on Lyrica 75 mg 3 times a day. She has no new concerns. She is scheduled for knee surgery on Monday which she is anxious about. Denies any systemic complaints such as fevers, chills, nausea, vomiting. No acute changes since last appointment, and no other complaints at this time.   Objective: AAO x3, NAD Hyperkeratotic lesions present left foot metatarsal area as well as fifth metatarsal base.  Also present on the right foot submetatarsal x1.  There is no ongoing ulceration drainage or any signs of infection. Multiple digital deformities are present which are unchanged.  Dorsal midfoot arthritic changes noted. No pain with calf compression, swelling, warmth, erythema  Assessment: Symptomatic callus formation bilaterally, neuropathy  Plan: -All treatment options discussed with the patient including all alternatives, risks, complications.  -Debrided hyperkeratotic lesions x3 without any complications or bleeding -Continue Lyrica as prescribed.  -Patient encouraged to call the office with any questions, concerns, change in symptoms.   Trula Slade DPM

## 2019-11-12 NOTE — Anesthesia Preprocedure Evaluation (Addendum)
Anesthesia Evaluation  Patient identified by MRN, date of birth, ID band Patient awake    Reviewed: Allergy & Precautions, H&P , NPO status , Patient's Chart, lab work & pertinent test results  History of Anesthesia Complications Negative for: history of anesthetic complications  Airway Mallampati: II  TM Distance: >3 FB Neck ROM: Full    Dental no notable dental hx. (+) Dental Advisory Given   Pulmonary neg pulmonary ROS, former smoker,    Pulmonary exam normal        Cardiovascular hypertension, Pt. on medications and Pt. on home beta blockers Normal cardiovascular exam     Neuro/Psych negative neurological ROS  negative psych ROS   GI/Hepatic Neg liver ROS, GERD  Medicated and Controlled,  Endo/Other  negative endocrine ROS  Renal/GU Renal InsufficiencyRenal disease  negative genitourinary   Musculoskeletal  (+) Arthritis , Osteoarthritis,    Abdominal   Peds  Hematology negative hematology ROS (+)   Anesthesia Other Findings   Reproductive/Obstetrics negative OB ROS                            Anesthesia Physical  Anesthesia Plan  ASA: II  Anesthesia Plan: Spinal   Post-op Pain Management:  Regional for Post-op pain   Induction:   PONV Risk Score and Plan: 3 and Ondansetron, Propofol infusion and Treatment may vary due to age or medical condition  Airway Management Planned: Natural Airway and Simple Face Mask  Additional Equipment:   Intra-op Plan:   Post-operative Plan:   Informed Consent: I have reviewed the patients History and Physical, chart, labs and discussed the procedure including the risks, benefits and alternatives for the proposed anesthesia with the patient or authorized representative who has indicated his/her understanding and acceptance.     Dental advisory given  Plan Discussed with: Anesthesiologist and Surgeon  Anesthesia Plan Comments:         Anesthesia Quick Evaluation

## 2019-11-13 ENCOUNTER — Encounter (HOSPITAL_COMMUNITY): Payer: Self-pay | Admitting: Orthopedic Surgery

## 2019-11-13 ENCOUNTER — Other Ambulatory Visit: Payer: Self-pay

## 2019-11-13 ENCOUNTER — Inpatient Hospital Stay (HOSPITAL_COMMUNITY)
Admission: RE | Admit: 2019-11-13 | Discharge: 2019-11-15 | DRG: 470 | Disposition: A | Discharge status: Home / Self Care | Payer: Medicare Other | Attending: Orthopedic Surgery | Admitting: Orthopedic Surgery

## 2019-11-13 ENCOUNTER — Ambulatory Visit (HOSPITAL_COMMUNITY): Payer: Medicare Other | Admitting: Physician Assistant

## 2019-11-13 ENCOUNTER — Ambulatory Visit (HOSPITAL_COMMUNITY): Payer: Medicare Other | Admitting: Anesthesiology

## 2019-11-13 ENCOUNTER — Encounter (HOSPITAL_COMMUNITY)
Admission: RE | Disposition: A | Discharge status: Home / Self Care | Payer: Self-pay | Source: Home / Self Care | Attending: Orthopedic Surgery

## 2019-11-13 DIAGNOSIS — M1710 Unilateral primary osteoarthritis, unspecified knee: Secondary | ICD-10-CM | POA: Diagnosis present

## 2019-11-13 DIAGNOSIS — Z833 Family history of diabetes mellitus: Secondary | ICD-10-CM

## 2019-11-13 DIAGNOSIS — Z96652 Presence of left artificial knee joint: Secondary | ICD-10-CM | POA: Diagnosis present

## 2019-11-13 DIAGNOSIS — K219 Gastro-esophageal reflux disease without esophagitis: Secondary | ICD-10-CM | POA: Diagnosis present

## 2019-11-13 DIAGNOSIS — M179 Osteoarthritis of knee, unspecified: Secondary | ICD-10-CM | POA: Diagnosis present

## 2019-11-13 DIAGNOSIS — Z87891 Personal history of nicotine dependence: Secondary | ICD-10-CM

## 2019-11-13 DIAGNOSIS — Z8249 Family history of ischemic heart disease and other diseases of the circulatory system: Secondary | ICD-10-CM

## 2019-11-13 DIAGNOSIS — M1711 Unilateral primary osteoarthritis, right knee: Secondary | ICD-10-CM | POA: Diagnosis not present

## 2019-11-13 DIAGNOSIS — Z96651 Presence of right artificial knee joint: Secondary | ICD-10-CM

## 2019-11-13 DIAGNOSIS — Z8672 Personal history of thrombophlebitis: Secondary | ICD-10-CM

## 2019-11-13 HISTORY — PX: TOTAL KNEE ARTHROPLASTY: SHX125

## 2019-11-13 LAB — TYPE AND SCREEN
ABO/RH(D): O POS
Antibody Screen: NEGATIVE

## 2019-11-13 LAB — GLUCOSE, CAPILLARY: Glucose-Capillary: 131 mg/dL — ABNORMAL HIGH (ref 70–99)

## 2019-11-13 SURGERY — ARTHROPLASTY, KNEE, TOTAL
Anesthesia: Spinal | Site: Knee | Laterality: Right

## 2019-11-13 MED ORDER — SODIUM CHLORIDE (PF) 0.9 % IJ SOLN
INTRAMUSCULAR | Status: AC
  Filled 2019-11-13: qty 50

## 2019-11-13 MED ORDER — METOPROLOL SUCCINATE ER 25 MG PO TB24
25.0000 mg | ORAL_TABLET | Freq: Every day | ORAL | Status: DC
  Administered 2019-11-14 – 2019-11-15 (×2): 25 mg via ORAL
  Filled 2019-11-13 (×2): qty 1

## 2019-11-13 MED ORDER — PROPOFOL 10 MG/ML IV BOLUS
INTRAVENOUS | Status: AC
  Filled 2019-11-13: qty 20

## 2019-11-13 MED ORDER — TRAMADOL HCL 50 MG PO TABS
50.0000 mg | ORAL_TABLET | Freq: Four times a day (QID) | ORAL | Status: DC | PRN
  Administered 2019-11-13: 100 mg via ORAL
  Filled 2019-11-13: qty 2

## 2019-11-13 MED ORDER — FENTANYL CITRATE (PF) 100 MCG/2ML IJ SOLN
INTRAMUSCULAR | Status: AC
  Filled 2019-11-13: qty 2

## 2019-11-13 MED ORDER — DEXAMETHASONE SODIUM PHOSPHATE 10 MG/ML IJ SOLN
10.0000 mg | Freq: Once | INTRAMUSCULAR | Status: AC
  Administered 2019-11-14: 10 mg via INTRAVENOUS
  Filled 2019-11-13: qty 1

## 2019-11-13 MED ORDER — EPHEDRINE SULFATE-NACL 50-0.9 MG/10ML-% IV SOSY
PREFILLED_SYRINGE | INTRAVENOUS | Status: DC | PRN
  Administered 2019-11-13: 15 mg via INTRAVENOUS

## 2019-11-13 MED ORDER — SODIUM CHLORIDE (PF) 0.9 % IJ SOLN
INTRAMUSCULAR | Status: DC | PRN
  Administered 2019-11-13: 60 mL

## 2019-11-13 MED ORDER — SODIUM CHLORIDE (PF) 0.9 % IJ SOLN
INTRAMUSCULAR | Status: AC
  Filled 2019-11-13: qty 10

## 2019-11-13 MED ORDER — RIVAROXABAN 10 MG PO TABS
10.0000 mg | ORAL_TABLET | Freq: Every day | ORAL | Status: DC
  Administered 2019-11-14 – 2019-11-15 (×2): 10 mg via ORAL
  Filled 2019-11-13 (×2): qty 1

## 2019-11-13 MED ORDER — METOCLOPRAMIDE HCL 5 MG/ML IJ SOLN: 5.0000 mg | Freq: Three times a day (TID) | INTRAMUSCULAR | Status: DC | PRN

## 2019-11-13 MED ORDER — FLEET ENEMA 7-19 GM/118ML RE ENEM: 1.0000 | ENEMA | Freq: Once | RECTAL | Status: DC | PRN

## 2019-11-13 MED ORDER — CHLORHEXIDINE GLUCONATE 0.12 % MT SOLN
15.0000 mL | Freq: Once | OROMUCOSAL | Status: AC
  Administered 2019-11-13: 15 mL via OROMUCOSAL

## 2019-11-13 MED ORDER — HYDROMORPHONE HCL 2 MG PO TABS
ORAL_TABLET | ORAL | Status: AC
  Filled 2019-11-13: qty 1

## 2019-11-13 MED ORDER — SODIUM CHLORIDE 0.9 % IV SOLN: INTRAVENOUS | Status: DC

## 2019-11-13 MED ORDER — POVIDONE-IODINE 10 % EX SWAB
2.0000 "application " | Freq: Once | CUTANEOUS | Status: AC
  Administered 2019-11-13: 2 via TOPICAL

## 2019-11-13 MED ORDER — METHOCARBAMOL 500 MG IVPB - SIMPLE MED
500.0000 mg | Freq: Four times a day (QID) | INTRAVENOUS | Status: DC | PRN
  Filled 2019-11-13: qty 50

## 2019-11-13 MED ORDER — PROPOFOL 10 MG/ML IV BOLUS
INTRAVENOUS | Status: DC | PRN
  Administered 2019-11-13: 70 mg via INTRAVENOUS

## 2019-11-13 MED ORDER — METHOCARBAMOL 500 MG PO TABS
500.0000 mg | ORAL_TABLET | Freq: Four times a day (QID) | ORAL | Status: DC | PRN
  Administered 2019-11-13 – 2019-11-14 (×3): 500 mg via ORAL
  Filled 2019-11-13 (×3): qty 1

## 2019-11-13 MED ORDER — HYDROMORPHONE HCL 2 MG PO TABS
2.0000 mg | ORAL_TABLET | ORAL | Status: DC | PRN
  Administered 2019-11-13: 2 mg via ORAL
  Administered 2019-11-13 – 2019-11-15 (×8): 4 mg via ORAL
  Filled 2019-11-13 (×8): qty 2

## 2019-11-13 MED ORDER — PANTOPRAZOLE SODIUM 40 MG PO TBEC
40.0000 mg | DELAYED_RELEASE_TABLET | Freq: Every day | ORAL | Status: DC
  Administered 2019-11-14 – 2019-11-15 (×2): 40 mg via ORAL
  Filled 2019-11-13 (×2): qty 1

## 2019-11-13 MED ORDER — PROPOFOL 500 MG/50ML IV EMUL
INTRAVENOUS | Status: DC | PRN
  Administered 2019-11-13: 75 ug/kg/min via INTRAVENOUS

## 2019-11-13 MED ORDER — PHENOL 1.4 % MT LIQD: 1.0000 | OROMUCOSAL | Status: DC | PRN

## 2019-11-13 MED ORDER — PREGABALIN 75 MG PO CAPS
75.0000 mg | ORAL_CAPSULE | Freq: Three times a day (TID) | ORAL | Status: DC
  Administered 2019-11-13 – 2019-11-15 (×6): 75 mg via ORAL
  Filled 2019-11-13 (×6): qty 1

## 2019-11-13 MED ORDER — DEXAMETHASONE SODIUM PHOSPHATE 10 MG/ML IJ SOLN: 8.0000 mg | Freq: Once | INTRAMUSCULAR | Status: DC

## 2019-11-13 MED ORDER — FENTANYL CITRATE (PF) 250 MCG/5ML IJ SOLN
INTRAMUSCULAR | Status: DC | PRN
  Administered 2019-11-13 (×2): 25 ug via INTRAVENOUS
  Administered 2019-11-13: 100 ug via INTRAVENOUS
  Administered 2019-11-13 (×2): 25 ug via INTRAVENOUS

## 2019-11-13 MED ORDER — TRANEXAMIC ACID-NACL 1000-0.7 MG/100ML-% IV SOLN
1000.0000 mg | INTRAVENOUS | Status: AC
  Administered 2019-11-13: 1000 mg via INTRAVENOUS
  Filled 2019-11-13: qty 100

## 2019-11-13 MED ORDER — LACTATED RINGERS IV BOLUS: 250.0000 mL | Freq: Once | INTRAVENOUS | Status: DC

## 2019-11-13 MED ORDER — CEFAZOLIN SODIUM-DEXTROSE 2-4 GM/100ML-% IV SOLN
2.0000 g | INTRAVENOUS | Status: AC
  Administered 2019-11-13: 2 g via INTRAVENOUS
  Filled 2019-11-13: qty 100

## 2019-11-13 MED ORDER — LACTATED RINGERS IV BOLUS
500.0000 mL | Freq: Once | INTRAVENOUS | Status: AC
  Administered 2019-11-13: 500 mL via INTRAVENOUS

## 2019-11-13 MED ORDER — RIVAROXABAN 10 MG PO TABS
10.0000 mg | ORAL_TABLET | Freq: Every day | ORAL | 0 refills | Status: DC
Start: 2019-11-13 — End: 2020-01-17

## 2019-11-13 MED ORDER — LACTATED RINGERS IV SOLN: INTRAVENOUS | Status: DC

## 2019-11-13 MED ORDER — METHOCARBAMOL 500 MG IVPB - SIMPLE MED
INTRAVENOUS | Status: AC
  Administered 2019-11-13: 500 mg
  Filled 2019-11-13: qty 50

## 2019-11-13 MED ORDER — MENTHOL 3 MG MT LOZG: 1.0000 | LOZENGE | OROMUCOSAL | Status: DC | PRN

## 2019-11-13 MED ORDER — PROMETHAZINE HCL 25 MG/ML IJ SOLN: 6.2500 mg | INTRAMUSCULAR | Status: DC | PRN

## 2019-11-13 MED ORDER — MIDAZOLAM HCL 2 MG/2ML IJ SOLN
INTRAMUSCULAR | Status: AC
  Filled 2019-11-13: qty 2

## 2019-11-13 MED ORDER — TRAMADOL HCL 50 MG PO TABS: 50.0000 mg | ORAL_TABLET | Freq: Four times a day (QID) | ORAL | 0 refills | Status: DC | PRN

## 2019-11-13 MED ORDER — FENTANYL CITRATE (PF) 100 MCG/2ML IJ SOLN
25.0000 ug | INTRAMUSCULAR | Status: DC | PRN
  Administered 2019-11-13 (×3): 50 ug via INTRAVENOUS

## 2019-11-13 MED ORDER — DIPHENHYDRAMINE HCL 12.5 MG/5ML PO ELIX: 12.5000 mg | ORAL_SOLUTION | ORAL | Status: DC | PRN

## 2019-11-13 MED ORDER — STERILE WATER FOR IRRIGATION IR SOLN
Status: DC | PRN
  Administered 2019-11-13: 2000 mL

## 2019-11-13 MED ORDER — CELECOXIB 200 MG PO CAPS
200.0000 mg | ORAL_CAPSULE | Freq: Once | ORAL | Status: AC
  Administered 2019-11-13: 200 mg via ORAL
  Filled 2019-11-13: qty 1

## 2019-11-13 MED ORDER — ONDANSETRON HCL 4 MG/2ML IJ SOLN: 4.0000 mg | Freq: Four times a day (QID) | INTRAMUSCULAR | Status: DC | PRN

## 2019-11-13 MED ORDER — ONDANSETRON HCL 4 MG/2ML IJ SOLN
INTRAMUSCULAR | Status: DC | PRN
  Administered 2019-11-13: 4 mg via INTRAVENOUS

## 2019-11-13 MED ORDER — SODIUM CHLORIDE 0.9 % IR SOLN
Status: DC | PRN
  Administered 2019-11-13: 1000 mL

## 2019-11-13 MED ORDER — ONDANSETRON HCL 4 MG PO TABS: 4.0000 mg | ORAL_TABLET | Freq: Four times a day (QID) | ORAL | Status: DC | PRN

## 2019-11-13 MED ORDER — METOCLOPRAMIDE HCL 5 MG PO TABS: 5.0000 mg | ORAL_TABLET | Freq: Three times a day (TID) | ORAL | Status: DC | PRN

## 2019-11-13 MED ORDER — BISACODYL 10 MG RE SUPP: 10.0000 mg | Freq: Every day | RECTAL | Status: DC | PRN

## 2019-11-13 MED ORDER — MIDAZOLAM HCL 2 MG/2ML IJ SOLN
INTRAMUSCULAR | Status: DC | PRN
  Administered 2019-11-13: 2 mg via INTRAVENOUS

## 2019-11-13 MED ORDER — ACETAMINOPHEN 500 MG PO TABS
1000.0000 mg | ORAL_TABLET | Freq: Four times a day (QID) | ORAL | Status: AC
  Administered 2019-11-13 – 2019-11-14 (×4): 1000 mg via ORAL
  Filled 2019-11-13 (×4): qty 2

## 2019-11-13 MED ORDER — METHOCARBAMOL 500 MG PO TABS: 500.0000 mg | ORAL_TABLET | Freq: Four times a day (QID) | ORAL | 0 refills | Status: DC | PRN

## 2019-11-13 MED ORDER — DOCUSATE SODIUM 100 MG PO CAPS
100.0000 mg | ORAL_CAPSULE | Freq: Two times a day (BID) | ORAL | Status: DC
  Administered 2019-11-13 – 2019-11-15 (×4): 100 mg via ORAL
  Filled 2019-11-13 (×4): qty 1

## 2019-11-13 MED ORDER — CEFAZOLIN SODIUM-DEXTROSE 2-4 GM/100ML-% IV SOLN
2.0000 g | Freq: Four times a day (QID) | INTRAVENOUS | Status: AC
  Administered 2019-11-13: 2 g via INTRAVENOUS
  Filled 2019-11-13: qty 100

## 2019-11-13 MED ORDER — BUPIVACAINE HCL (PF) 0.25 % IJ SOLN
INTRAMUSCULAR | Status: AC
  Filled 2019-11-13: qty 30

## 2019-11-13 MED ORDER — BUPIVACAINE LIPOSOME 1.3 % IJ SUSP
INTRAMUSCULAR | Status: DC | PRN
  Administered 2019-11-13: 20 mL

## 2019-11-13 MED ORDER — BUPIVACAINE IN DEXTROSE 0.75-8.25 % IT SOLN
INTRATHECAL | Status: DC | PRN
  Administered 2019-11-13: 1.6 mL via INTRATHECAL

## 2019-11-13 MED ORDER — MORPHINE SULFATE (PF) 2 MG/ML IV SOLN
0.5000 mg | INTRAVENOUS | Status: DC | PRN
  Administered 2019-11-13: 1 mg via INTRAVENOUS
  Filled 2019-11-13: qty 1

## 2019-11-13 MED ORDER — ACETAMINOPHEN 10 MG/ML IV SOLN
1000.0000 mg | Freq: Four times a day (QID) | INTRAVENOUS | Status: DC
  Administered 2019-11-13: 1000 mg via INTRAVENOUS
  Filled 2019-11-13: qty 100

## 2019-11-13 MED ORDER — HYDROMORPHONE HCL 2 MG PO TABS: 2.0000 mg | ORAL_TABLET | Freq: Four times a day (QID) | ORAL | 0 refills | Status: AC | PRN

## 2019-11-13 MED ORDER — LACTATED RINGERS IV BOLUS
250.0000 mL | Freq: Once | INTRAVENOUS | Status: AC
  Administered 2019-11-13: 250 mL via INTRAVENOUS

## 2019-11-13 MED ORDER — POLYETHYLENE GLYCOL 3350 17 G PO PACK: 17.0000 g | PACK | Freq: Every day | ORAL | Status: DC | PRN

## 2019-11-13 MED ORDER — ROPIVACAINE HCL 7.5 MG/ML IJ SOLN
INTRAMUSCULAR | Status: DC | PRN
  Administered 2019-11-13: 20 mL via PERINEURAL

## 2019-11-13 MED ORDER — ORAL CARE MOUTH RINSE: 15.0000 mL | Freq: Once | OROMUCOSAL | Status: AC

## 2019-11-13 MED ORDER — HYDROCHLOROTHIAZIDE 12.5 MG PO CAPS
12.5000 mg | ORAL_CAPSULE | Freq: Every day | ORAL | Status: DC
  Administered 2019-11-14 – 2019-11-15 (×2): 12.5 mg via ORAL
  Filled 2019-11-13 (×2): qty 1

## 2019-11-13 SURGICAL SUPPLY — 51 items
BAG ZIPLOCK 12X15 (MISCELLANEOUS) ×2 IMPLANT
BLADE SAG 18X100X1.27 (BLADE) ×2 IMPLANT
BLADE SAW SGTL 11.0X1.19X90.0M (BLADE) ×2 IMPLANT
BLADE SURG SZ10 CARB STEEL (BLADE) ×4 IMPLANT
BNDG ELASTIC 6X5.8 VLCR STR LF (GAUZE/BANDAGES/DRESSINGS) ×2 IMPLANT
BOWL SMART MIX CTS (DISPOSABLE) ×2 IMPLANT
CEMENT HV SMART SET (Cement) ×4 IMPLANT
CEMENT TIBIA MBT (Knees) ×1 IMPLANT
COVER SURGICAL LIGHT HANDLE (MISCELLANEOUS) ×2 IMPLANT
COVER WAND RF STERILE (DRAPES) IMPLANT
CUFF TOURN SGL QUICK 34 (TOURNIQUET CUFF) ×1
CUFF TRNQT CYL 34X4.125X (TOURNIQUET CUFF) ×1 IMPLANT
DECANTER SPIKE VIAL GLASS SM (MISCELLANEOUS) ×2 IMPLANT
DRAPE U-SHAPE 47X51 STRL (DRAPES) ×2 IMPLANT
DRSG AQUACEL AG ADV 3.5X10 (GAUZE/BANDAGES/DRESSINGS) ×2 IMPLANT
DURAPREP 26ML APPLICATOR (WOUND CARE) ×2 IMPLANT
ELECT REM PT RETURN 15FT ADLT (MISCELLANEOUS) ×2 IMPLANT
FEMUR SIGMA PS SZ 3.0 R (Femur) ×2 IMPLANT
GLOVE BIO SURGEON STRL SZ7 (GLOVE) ×2 IMPLANT
GLOVE BIO SURGEON STRL SZ8 (GLOVE) ×2 IMPLANT
GLOVE BIOGEL PI IND STRL 7.0 (GLOVE) ×1 IMPLANT
GLOVE BIOGEL PI IND STRL 8 (GLOVE) ×1 IMPLANT
GLOVE BIOGEL PI INDICATOR 7.0 (GLOVE) ×1
GLOVE BIOGEL PI INDICATOR 8 (GLOVE) ×1
GOWN STRL REUS W/TWL LRG LVL3 (GOWN DISPOSABLE) ×4 IMPLANT
HANDPIECE INTERPULSE COAX TIP (DISPOSABLE) ×1
HOLDER FOLEY CATH W/STRAP (MISCELLANEOUS) IMPLANT
IMMOBILIZER KNEE 20 (SOFTGOODS) ×2
IMMOBILIZER KNEE 20 THIGH 36 (SOFTGOODS) ×1 IMPLANT
KIT TURNOVER KIT A (KITS) IMPLANT
MANIFOLD NEPTUNE II (INSTRUMENTS) ×2 IMPLANT
NS IRRIG 1000ML POUR BTL (IV SOLUTION) ×2 IMPLANT
PACK TOTAL KNEE CUSTOM (KITS) ×2 IMPLANT
PADDING CAST COTTON 6X4 STRL (CAST SUPPLIES) ×2 IMPLANT
PATELLA DOME PFC 35MM (Knees) ×2 IMPLANT
PENCIL SMOKE EVACUATOR (MISCELLANEOUS) IMPLANT
PIN STEINMAN FIXATION KNEE (PIN) ×2 IMPLANT
PLATE ROT INSERT 12.5MM SIZE 3 (Plate) ×2 IMPLANT
PROTECTOR NERVE ULNAR (MISCELLANEOUS) ×2 IMPLANT
SET HNDPC FAN SPRY TIP SCT (DISPOSABLE) ×1 IMPLANT
STRIP CLOSURE SKIN 1/2X4 (GAUZE/BANDAGES/DRESSINGS) ×2 IMPLANT
SUT MNCRL AB 4-0 PS2 18 (SUTURE) ×2 IMPLANT
SUT STRATAFIX 0 PDS 27 VIOLET (SUTURE) ×2
SUT VIC AB 2-0 CT1 27 (SUTURE) ×3
SUT VIC AB 2-0 CT1 TAPERPNT 27 (SUTURE) ×3 IMPLANT
SUTURE STRATFX 0 PDS 27 VIOLET (SUTURE) ×1 IMPLANT
TIBIA MBT CEMENT (Knees) ×2 IMPLANT
TRAY FOLEY MTR SLVR 16FR STAT (SET/KITS/TRAYS/PACK) ×2 IMPLANT
WATER STERILE IRR 1000ML POUR (IV SOLUTION) ×4 IMPLANT
WRAP KNEE MAXI GEL POST OP (GAUZE/BANDAGES/DRESSINGS) ×2 IMPLANT
YANKAUER SUCT BULB TIP 10FT TU (MISCELLANEOUS) ×2 IMPLANT

## 2019-11-13 NOTE — Progress Notes (Signed)
Dr Wynelle Link in unit, aware of pts episode with PT.  Aware pt seen by Dr Tobias Alexander, aware VSS.  Dr Synthia Innocent reports pt to go to otho floor, no need for telemtry.

## 2019-11-13 NOTE — Anesthesia Procedure Notes (Signed)
Spinal  Patient location during procedure: OR Start time: 11/13/2019 7:06 AM End time: 11/13/2019 7:16 AM Staffing Performed: anesthesiologist  Anesthesiologist: Duane Boston, MD Preanesthetic Checklist Completed: patient identified, IV checked, risks and benefits discussed, surgical consent, monitors and equipment checked, pre-op evaluation and timeout performed Spinal Block Patient position: sitting Prep: DuraPrep Patient monitoring: cardiac monitor, continuous pulse ox and blood pressure Approach: midline Location: L2-3 Injection technique: single-shot Needle Needle type: Quincke  Needle gauge: 22 G Needle length: 9 cm Additional Notes Functioning IV was confirmed and monitors were applied. Sterile prep and drape, including hand hygiene and sterile gloves were used. The patient was positioned and the spine was prepped. The skin was anesthetized with lidocaine. Difficult placement Unable to obtain csf with 24 ga pencan.  Free flow of clear CSF was obtained prior to injecting local anesthetic into the CSF.  The spinal needle aspirated freely following injection.  The needle was carefully withdrawn.  The patient tolerated the procedure well.

## 2019-11-13 NOTE — Discharge Instructions (Addendum)
 Frank Aluisio, MD Total Joint Specialist [facility] [place] [address] [place], [place] [address] [phone]    TOTAL KNEE REPLACEMENT POSTOPERATIVE DIRECTIONS    Knee Rehabilitation, Guidelines Following Surgery  Results after knee surgery are often greatly improved when you follow the exercise, range of motion and muscle strengthening exercises prescribed by your doctor. Safety measures are also important to protect the knee from further injury. If any of these exercises cause you to have increased pain or swelling in your knee joint, decrease the amount until you are comfortable again and slowly increase them. If you have problems or questions, call your caregiver or physical therapist for advice.   BLOOD CLOT PREVENTION . Take a 10 mg Xarelto once a day for three weeks following surgery. Then take an 81 mg Aspirin once a day for three weeks. Then discontinue Aspirin. . You may resume your vitamins/supplements once you have discontinued the Xarelto. . Do not take any NSAIDs (Advil, Aleve, Ibuprofen, Meloxicam, etc.) until you have discontinued the Xarelto.   HOME CARE INSTRUCTIONS  . Remove items at home which could result in a fall. This includes throw rugs or furniture in walking pathways.   ICE to the affected knee as much as tolerated. Icing helps control swelling. If the swelling is well controlled you will be more comfortable and rehab easier. Continue to use ice on the knee for pain and swelling from surgery. You may notice swelling that will progress down to the foot and ankle. This is normal after surgery. Elevate the leg when you are not up walking on it.    Continue to use the breathing machine which will help keep your temperature down.  It is common for your temperature to cycle up and down following surgery, especially at night when you are not up moving around and exerting yourself.  The breathing machine keeps your lungs expanded and your  temperature down.  Do not place pillow under knee, focus on keeping the knee straight while resting  PAIN CONTROL Achieving adequate pain control can be challenging in the first 48-72 hours after the nerve block wears off. During this time it is best to stay ahead of the pain by taking your prescribed pain meds every 4-6 hours. Once the pain is well controlled (you will not be pain free but the goal is having it under control) you may begin slowly weaning off the medications  DIET You may resume your previous home diet once you are discharged from the hospital.  DRESSING / WOUND CARE / SHOWERING . Keep your bulky bandage on for 2 days. On the third post-operative day you may remove the Ace bandage and gauze. There is a waterproof adhesive bandage on your skin which will stay in place until your first follow-up appointment. Once you remove this you will not need to place another bandage . You may begin showering 3 days following surgery, but do not submerge the incision under water.  ACTIVITY For the first 5 days the key is rest and control of pain and swelling . You should rest, ice and elevate the leg for 50 minutes out of every hour. Get up and walk/stretch for 10 minutes per hour. After 5 days you can increase your activity slowly as tolerated . Walk with your walker as instructed. Use the walker until you are comfortable transitioning to a cane. Walk with the cane in the opposite hand of the operative leg. You may discontinue the cane once you   are comfortable. . You may discontinue the knee immobilizer once you are able to perform a straight leg raise while lying down . Avoid periods of inactivity such as sitting longer than an hour when not asleep. This helps prevent blood clots.  . Do your home exercises twice a day starting on post-operative day 3. On the days you go to physical therapy, just do the home exercises once that day. . Do not drive a car until released by your surgeon.  . Do  not drive while taking narcotics.  TED HOSE STOCKINGS Wear the elastic stockings on both legs for three weeks following surgery during the day. You may remove them at night for sleeping.  WEIGHT BEARING You may bear weight as tolerated on the operative leg.  POSTOPERATIVE CONSTIPATION PROTOCOL Constipation - defined medically as fewer than three stools per week and severe constipation as less than one stool per week.  One of the most common issues patients have following surgery is constipation.  Even if you have a regular bowel pattern at home, your normal regimen is likely to be disrupted due to multiple reasons following surgery.  Combination of anesthesia, postoperative narcotics, change in appetite and fluid intake all can affect your bowels.  In order to avoid complications following surgery, here are some recommendations in order to help you during your recovery period.  Colace (docusate) - Pick up an over-the-counter form of Colace or another stool softener and take twice a day as long as you are requiring postoperative pain medications.  Take with a full glass of water daily.  If you experience loose stools or diarrhea, hold the colace until you stool forms back up.  If your symptoms do not get better within 1 week or if they get worse, check with your doctor.  MiraLax (polyethylene glycol) - Pick up over-the-counter to have on hand.  MiraLax is a solution that will increase the amount of water in your bowels to assist with bowel movements.  Take as directed and can mix with a glass of water, juice, soda, coffee, or tea.  Take if you go more than two days without a movement. Do not use MiraLax more than once per day. Call your doctor if you are still constipated or irregular after using this medication for 7 days in a row.  If you continue to have problems with postoperative constipation, please contact the office for further assistance and recommendations.  If you experience "the worst  abdominal pain ever" or develop nausea or vomiting, please contact the office immediatly for further recommendations for treatment.  ITCHING  If you experience itching with your medications, try taking only a single pain pill, or even half a pain pill at a time.  You can also use Benadryl over the counter for itching or also to help with sleep.   MEDICATIONS See your medication summary on the "After Visit Summary" that the nursing staff will review with you prior to discharge.  You may have some home medications which will be placed on hold until you complete the course of blood thinner medication.  It is important for you to complete the blood thinner medication as prescribed by your surgeon.  Continue your approved medications as instructed at time of discharge.  PRECAUTIONS If you experience chest pain or shortness of breath - call 911 immediately for transfer to the hospital emergency department.  If you develop a fever greater that 101 F, purulent drainage from wound, increased redness or drainage from wound,  foul odor from the wound/dressing, or calf pain - CONTACT YOUR SURGEON.                                                   FOLLOW-UP APPOINTMENTS Make sure you keep all of your appointments after your operation with your surgeon and caregivers. You should call the office at the above phone number and make an appointment for approximately two weeks after the date of your surgery or on the date instructed by your surgeon outlined in the "After Visit Summary".  MAKE SURE YOU:  . Understand these instructions.  . Get help right away if you are not doing well or get worse.   DENTAL ANTIBIOTICS:  In most cases prophylactic antibiotics for Dental procdeures after total joint surgery are not necessary.  Exceptions are as follows:  1. History of prior total joint infection  2. Severely immunocompromised (Organ Transplant, cancer chemotherapy, Rheumatoid biologic meds such as [place])  3.  Poorly controlled diabetes (A1C &gt; 8.0, blood glucose over 200)  If you have one of these conditions, contact your surgeon for an antibiotic prescription, prior to your dental procedure.    Pick up stool softner and laxative for home use following surgery while on pain medications. May shower starting three days after surgery. Please use a clean towel to pat the incision dry following showers. Continue to use ice for pain and swelling after surgery. Do not use any lotions or creams on the incision until instructed by your surgeon.  Information on my medicine - XARELTO (Rivaroxaban)  Why was Xarelto prescribed for you? Xarelto was prescribed for you to reduce the risk of blood clots forming after orthopedic surgery. The medical term for these abnormal blood clots is venous thromboembolism (VTE).  What do you need to know about xarelto ? Take your Xarelto ONCE DAILY at the same time every day. You may take it either with or without food.  If you have difficulty swallowing the tablet whole, you may crush it and mix in applesauce just prior to taking your dose.  Take Xarelto exactly as prescribed by your doctor and DO NOT stop taking Xarelto without talking to the doctor who prescribed the medication.  Stopping without other VTE prevention medication to take the place of Xarelto may increase your risk of developing a clot.  After discharge, you should have regular check-up appointments with your healthcare provider that is prescribing your Xarelto.    What do you do if you miss a dose? If you miss a dose, take it as soon as you remember on the same day then continue your regularly scheduled once daily regimen the next day. Do not take two doses of Xarelto on the same day.   Important Safety Information A possible side effect of Xarelto is bleeding. You should call your healthcare provider right away if you experience any of the following: ? Bleeding from an injury or your  nose that does not stop. ? Unusual colored urine (red or dark brown) or unusual colored stools (red or black). ? Unusual bruising for unknown reasons. ? A serious fall or if you hit your head (even if there is no bleeding).  Some medicines may interact with Xarelto and might increase your risk of bleeding while on Xarelto. To help avoid this, consult your healthcare provider or pharmacist prior to  using any new prescription or non-prescription medications, including herbals, vitamins, non-steroidal anti-inflammatory drugs (NSAIDs) and supplements.  This website has more information on Xarelto: [email or web address].[email or web address]

## 2019-11-13 NOTE — Progress Notes (Signed)
Orthopedic Tech Progress Note Patient Details:  Margaret [name] [birth year] [id number]  CPM Right Knee CPM Right Knee: On Right Knee Flexion (Degrees): 40 Right Knee Extension (Degrees): 10  Post Interventions Patient Tolerated: Well Instructions Provided: Care of device, Adjustment of device  Stephens November 11/13/2019, 6:39 PM

## 2019-11-13 NOTE — Anesthesia Procedure Notes (Signed)
Procedure Name: LMA Insertion Date/Time: 11/13/2019 7:36 AM Performed by: Myna Bright, CRNA Pre-anesthesia Checklist: Patient identified, Emergency Drugs available, Suction available and Patient being monitored Patient Re-evaluated:Patient Re-evaluated prior to induction Oxygen Delivery Method: Circle system utilized Preoxygenation: Pre-oxygenation with 100% oxygen Induction Type: IV induction Ventilation: Mask ventilation without difficulty LMA: LMA inserted LMA Size: 4.0 Number of attempts: 1 Placement Confirmation: positive ETCO2 and breath sounds checked- equal and bilateral Tube secured with: Tape Dental Injury: Teeth and Oropharynx as per pre-operative assessment

## 2019-11-13 NOTE — Progress Notes (Signed)
Dr Wynelle Link notified per PT  Pt's daughter does not want pt to go home.  Dr Wynelle Link orders pt to stay

## 2019-11-13 NOTE — Evaluation (Signed)
Physical Therapy Evaluation Patient Details Name: Margaret [name] MRN: [id number] DOB: [birth year] Today's Date: 11/13/2019   History of Present Illness  s/p R TKA. PMH: L TKA  Clinical Impression  Pt is s/p TKA resulting in the deficits listed below (see PT Problem List).  Pt motivated however with unsteady gait X 60' and significant knee buckling end of distance requiring mod assist.  Do not feel pt is safe for d/c at this time. Discussed with pt, dtr and RN  Pt will benefit from skilled PT to increase their independence and safety with mobility to allow discharge to the venue listed below.       Follow Up Recommendations Follow surgeon's recommendation for DC plan and follow-up therapies    Equipment Recommendations  None recommended by PT    Recommendations for Other Services       Precautions / Restrictions Precautions Precautions: Knee Required Braces or Orthoses: Knee Immobilizer - Right Knee Immobilizer - Right: Discontinue once straight leg raise with < 10 degree lag      Mobility  Bed Mobility Overal bed mobility: Needs Assistance Bed Mobility: Supine to Sit;Sit to Supine     Supine to sit: Min assist Sit to supine: Min assist   General bed mobility comments: assist with RLE  Transfers Overall transfer level: Needs assistance   Transfers: Sit to/from Stand           General transfer comment: cues for hand placement  Ambulation/Gait Ambulation/Gait assistance: Min assist;Mod assist Gait Distance (Feet): 60 Feet Assistive device: Rolling walker (2 wheeled) Gait Pattern/deviations: Step-to pattern;Decreased stance time - right;Decreased step length - right     General Gait Details: cues for sequence, safety, assist for balance with wt shifting. end of distance pt with significant knee buckling last 4' requiring mod assist for wt shift/fall prevention  Stairs            Wheelchair Mobility    Modified Rankin (Stroke Patients Only)        Balance Overall balance assessment: Needs assistance         Standing balance support: During functional activity;Bilateral upper extremity supported Standing balance-Leahy Scale: Poor Standing balance comment: reliant on UEs                             Pertinent Vitals/Pain Pain Assessment: 0-10 Pain Score: 5  Pain Location: right ankle Pain Descriptors / Indicators: Sore Pain Intervention(s): Limited activity within patient's tolerance;Monitored during session;Premedicated before session    Home Living Family/patient expects to be discharged to:: Private residence Living Arrangements: Children;Non-relatives/Friends Available Help at Discharge: Family;Available 24 hours/day Type of Home: House Home Access: Level entry     Home Layout: One level Home Equipment: Walker - 2 wheels      Prior Function Level of Independence: Independent               Hand Dominance        Extremity/Trunk Assessment   Upper Extremity Assessment Upper Extremity Assessment: Defer to OT evaluation    Lower Extremity Assessment Lower Extremity Assessment: RLE deficits/detail RLE Deficits / Details: ankle WFL,  ~ 20 degree quad lag, hip flexion 2+/5 to 3/5       Communication      Cognition Arousal/Alertness: Awake/alert Behavior During Therapy: WFL for tasks assessed/performed Overall Cognitive Status: Within Functional Limits for tasks assessed  General Comments      Exercises Total Joint Exercises Ankle Circles/Pumps: AROM;Both;10 reps Quad Sets: AROM;Both;10 reps   Assessment/Plan    PT Assessment Patient needs continued PT services  PT Problem List Decreased strength;Decreased activity tolerance;Decreased range of motion;Decreased mobility;Decreased knowledge of use of DME;Decreased balance;Pain       PT Treatment Interventions DME instruction;Therapeutic activities;Gait training;Functional  mobility training;Therapeutic exercise;Stair training    PT Goals (Current goals can be found in the Care Plan section)  Acute Rehab PT Goals Patient Stated Goal: home soon PT Goal Formulation: With patient Time For Goal Achievement: 11/20/19 Potential to Achieve Goals: Good    Frequency 7X/week   Barriers to discharge        Co-evaluation               AM-PAC PT "6 Clicks" Mobility  Outcome Measure Help needed turning from your back to your side while in a flat bed without using bedrails?: A Little Help needed moving from lying on your back to sitting on the side of a flat bed without using bedrails?: A Little Help needed moving to and from a bed to a chair (including a wheelchair)?: A Little Help needed standing up from a chair using your arms (e.g., wheelchair or bedside chair)?: A Little Help needed to walk in hospital room?: A Lot Help needed climbing 3-5 steps with a railing? : A Lot 6 Click Score: 16    End of Session Equipment Utilized During Treatment: Gait belt;Right knee immobilizer Activity Tolerance: Patient tolerated treatment well Patient left: with call bell/phone within reach;in chair;with family/visitor present   PT Visit Diagnosis: Muscle weakness (generalized) (M62.81);Unsteadiness on feet (R26.81)    Time: 7741-2878 PT Time Calculation (min) (ACUTE ONLY): 37 min   Charges:   PT Evaluation $PT Eval Low Complexity: 1 Low PT Treatments $Gait Training: 8-22 mins        Baxter Flattery, PT  Acute Rehab Dept ([place]) [phone] Pager [phone]  11/13/2019   [facility] 11/13/2019, 2:13 PM

## 2019-11-13 NOTE — Progress Notes (Signed)
Dr Wynelle Link notified that pt failed PT and will be admitted.

## 2019-11-13 NOTE — Care Plan (Signed)
Ortho Bundle Case Management Note  Patient Details  Name: Margaret [name] MRN: [id number] Date of Birth: 02/26/1941  R TKA on 11-13-19 DCP:  Home with dtr.  1 story home with 1 ste. DME:  No needs.  Borrowing a RW, has elevated toilets with grab bar. PT:  [facility].  PT eval scheduled on 11-15-19.                   DME Arranged:  N/A DME Agency:  NA  HH Arranged:  NA HH Agency:  NA  Additional Comments: Please contact me with any questions of if this plan should need to change.  Marianne Sofia, RN,CCM [facility]  [phone] 11/13/2019, 2:02 PM

## 2019-11-13 NOTE — Anesthesia Postprocedure Evaluation (Signed)
Anesthesia Post Note  Patient: Margaret [name]  Procedure(s) Performed: TOTAL KNEE ARTHROPLASTY SDDC (Right Knee)     Patient location during evaluation: PACU Anesthesia Type: Spinal Level of consciousness: awake and alert Pain management: pain level controlled Vital Signs Assessment: post-procedure vital signs reviewed and stable Respiratory status: spontaneous breathing and respiratory function stable Cardiovascular status: blood pressure returned to baseline and stable Postop Assessment: spinal receding Anesthetic complications: no   No complications documented.  Last Vitals:  Vitals:   11/13/19 0930 11/13/19 1015  BP: (!) 146/83 (!) 140/93  Pulse: 61   Resp: 10   Temp:  36.4 C  SpO2: 97%     Last Pain:  Vitals:   11/13/19 1030  TempSrc:   PainSc: 4                  [name] DANIEL

## 2019-11-13 NOTE — Transfer of Care (Signed)
Immediate Anesthesia Transfer of Care Note  Patient: Margaret [name]  Procedure(s) Performed: TOTAL KNEE ARTHROPLASTY SDDC (Right Knee)  Patient Location: PACU  Anesthesia Type:Spinal and GA combined with regional for post-op pain  Level of Consciousness: awake, alert , oriented and patient cooperative  Airway & Oxygen Therapy: Patient Spontanous Breathing and Patient connected to nasal cannula oxygen  Post-op Assessment: Report given to RN and Post -op Vital signs reviewed and stable  Post vital signs: Reviewed and stable  Last Vitals:  Vitals Value Taken Time  BP    Temp    Pulse    Resp    SpO2      Last Pain:  Vitals:   11/13/19 0630  TempSrc: Oral  PainSc:       Patients Stated Pain Goal: 3 (54/27/06 2376)  Complications: No complications documented.

## 2019-11-13 NOTE — Interval H&P Note (Signed)
History and Physical Interval Note:  11/13/2019 6:48 AM  Margaret [name]  has presented today for surgery, with the diagnosis of right knee osteoarthritis.  The various methods of treatment have been discussed with the patient and family. After consideration of risks, benefits and other options for treatment, the patient has consented to  Procedure(s) with comments: TOTAL KNEE ARTHROPLASTY SDDC (Right) - 24min as a surgical intervention.  The patient's history has been reviewed, patient examined, no change in status, stable for surgery.  I have reviewed the patient's chart and labs.  Questions were answered to the patient's satisfaction.     Pilar Plate Pavan Bring

## 2019-11-13 NOTE — Anesthesia Procedure Notes (Signed)
Anesthesia Regional Block: Adductor canal block   Pre-Anesthetic Checklist: ,, timeout performed, Correct Patient, Correct Site, Correct Laterality, Correct Procedure, Correct Position, site marked, Risks and benefits discussed,  Surgical consent,  Pre-op evaluation,  At surgeon's request and post-op pain management  Laterality: Right  Prep: chloraprep       Needles:  Injection technique: Single-shot  Needle Type: Stimulator Needle - 80     Needle Length: 10cm  Needle Gauge: 21     Additional Needles:   Narrative:  Start time: 11/13/2019 6:42 AM End time: 11/13/2019 6:52 AM Injection made incrementally with aspirations every 5 mL.  Performed by: Personally

## 2019-11-13 NOTE — Op Note (Signed)
OPERATIVE REPORT-TOTAL KNEE ARTHROPLASTY   Pre-operative diagnosis- Osteoarthritis  Right knee(s)  Post-operative diagnosis- Osteoarthritis Right knee(s)  Procedure-  Right  Total Knee Arthroplasty  Surgeon- Dione Plover. Tyneisha Hegeman, MD  Assistant- Molli Barrows, PA-C   Anesthesia-  Adductor canal block and general  EBL-50 mL   Drains Hemovac  Tourniquet time-  Total Tourniquet Time Documented: Thigh (Right) - 36 minutes Total: Thigh (Right) - 36 minutes     Complications- None  Condition-PACU - hemodynamically stable.   Brief Clinical Note  Margaret [name] is a 79 y.o. year old female with end stage OA of her right knee with progressively worsening pain and dysfunction. She has constant pain, with activity and at rest and significant functional deficits with difficulties even with ADLs. She has had extensive non-op management including analgesics, injections of cortisone and viscosupplements, and home exercise program, but remains in significant pain with significant dysfunction.Radiographs show bone on bone arthritis all 3 compartments. She presents now for right Total Knee Arthroplasty.    Procedure in detail---   The patient is brought into the operating room and positioned supine on the operating table. After successful administration of  Adductor canal block and general,   a tourniquet is placed high on the  Right thigh(s) and the lower extremity is prepped and draped in the usual sterile fashion. Time out is performed by the operating team and then the  Right lower extremity is wrapped in Esmarch, knee flexed and the tourniquet inflated to 300 mmHg.       A midline incision is made with a ten blade through the subcutaneous tissue to the level of the extensor mechanism. A fresh blade is used to make a medial parapatellar arthrotomy. Soft tissue over the proximal medial tibia is subperiosteally elevated to the joint line with a knife and into the semimembranosus bursa with a Cobb  elevator. Soft tissue over the proximal lateral tibia is elevated with attention being paid to avoiding the patellar tendon on the tibial tubercle. The patella is everted, knee flexed 90 degrees and the ACL and PCL are removed. Findings are bone on bone all 3 compartments with massive global osteophytes.        The drill is used to create a starting hole in the distal femur and the canal is thoroughly irrigated with sterile saline to remove the fatty contents. The 5 degree Right  valgus alignment guide is placed into the femoral canal and the distal femoral cutting block is pinned to remove 10 mm off the distal femur. Resection is made with an oscillating saw.      The tibia is subluxed forward and the menisci are removed. The extramedullary alignment guide is placed referencing proximally at the medial aspect of the tibial tubercle and distally along the second metatarsal axis and tibial crest. The block is pinned to remove 41mm off the more deficient lateral  side. Resection is made with an oscillating saw. Size 3is the most appropriate size for the tibia and the proximal tibia is prepared with the modular drill and keel punch for that size.      The femoral sizing guide is placed and size 3 is most appropriate. Rotation is marked off the epicondylar axis and confirmed by creating a rectangular flexion gap at 90 degrees. The size 3 cutting block is pinned in this rotation and the anterior, posterior and chamfer cuts are made with the oscillating saw. The intercondylar block is then placed and that cut is made.  Trial size 3 tibial component, trial size 3 posterior stabilized femur and a 12.5  mm posterior stabilized rotating platform insert trial is placed. Full extension is achieved with excellent varus/valgus and anterior/posterior balance throughout full range of motion. The patella is everted and thickness measured to be 22  mm. Free hand resection is taken to 12 mm, a 35 template is placed, lug holes  are drilled, trial patella is placed, and it tracks normally. Osteophytes are removed off the posterior femur with the trial in place. All trials are removed and the cut bone surfaces prepared with pulsatile lavage. Cement is mixed and once ready for implantation, the size 3 tibial implant, size  3 posterior stabilized femoral component, and the size 35 patella are cemented in place and the patella is held with the clamp. The trial insert is placed and the knee held in full extension. The Exparel (20 ml mixed with 60 ml saline) is injected into the extensor mechanism, posterior capsule, medial and lateral gutters and subcutaneous tissues.  All extruded cement is removed and once the cement is hard the permanent 12.5 mm posterior stabilized rotating platform insert is placed into the tibial tray.      The wound is copiously irrigated with saline solution and the extensor mechanism closed over a hemovac drain with #1 V-loc suture. The tourniquet is released for a total tourniquet time of 36  minutes. Flexion against gravity is 140 degrees and the patella tracks normally. Subcutaneous tissue is closed with 2.0 vicryl and subcuticular with running 4.0 Monocryl. The incision is cleaned and dried and steri-strips and a bulky sterile dressing are applied. The limb is placed into a knee immobilizer and the patient is awakened and transported to recovery in stable condition.      Please note that a surgical assistant was a medical necessity for this procedure in order to perform it in a safe and expeditious manner. Surgical assistant was necessary to retract the ligaments and vital neurovascular structures to prevent injury to them and also necessary for proper positioning of the limb to allow for anatomic placement of the prosthesis.   Dione Plover Abanoub Hanken, MD    11/13/2019, 8:15 AM

## 2019-11-14 ENCOUNTER — Encounter (HOSPITAL_COMMUNITY): Payer: Self-pay | Admitting: Orthopedic Surgery

## 2019-11-14 DIAGNOSIS — Z8249 Family history of ischemic heart disease and other diseases of the circulatory system: Secondary | ICD-10-CM | POA: Diagnosis not present

## 2019-11-14 DIAGNOSIS — Z8672 Personal history of thrombophlebitis: Secondary | ICD-10-CM | POA: Diagnosis not present

## 2019-11-14 DIAGNOSIS — Z96652 Presence of left artificial knee joint: Secondary | ICD-10-CM | POA: Diagnosis not present

## 2019-11-14 DIAGNOSIS — K219 Gastro-esophageal reflux disease without esophagitis: Secondary | ICD-10-CM | POA: Diagnosis not present

## 2019-11-14 DIAGNOSIS — Z87891 Personal history of nicotine dependence: Secondary | ICD-10-CM | POA: Diagnosis not present

## 2019-11-14 DIAGNOSIS — M1711 Unilateral primary osteoarthritis, right knee: Secondary | ICD-10-CM | POA: Diagnosis not present

## 2019-11-14 DIAGNOSIS — Z833 Family history of diabetes mellitus: Secondary | ICD-10-CM | POA: Diagnosis not present

## 2019-11-14 LAB — BASIC METABOLIC PANEL
Anion gap: 8 (ref 5–15)
BUN: 21 mg/dL (ref 8–23)
CO2: 27 mmol/L (ref 22–32)
Calcium: 8.3 mg/dL — ABNORMAL LOW (ref 8.9–10.3)
Chloride: 107 mmol/L (ref 98–111)
Creatinine, Ser: 0.99 mg/dL (ref 0.44–1.00)
GFR calc Af Amer: >60 mL/min (ref >60)
GFR calc non Af Amer: 54 mL/min — ABNORMAL LOW (ref >60)
Glucose, Bld: 113 mg/dL — ABNORMAL HIGH (ref 70–99)
Potassium: 3.5 mmol/L (ref 3.5–5.1)
Sodium: 142 mmol/L (ref 135–145)

## 2019-11-14 LAB — CBC
HCT: 35.8 % — ABNORMAL LOW (ref 36.0–46.0)
Hemoglobin: 11.5 g/dL — ABNORMAL LOW (ref 12.0–15.0)
MCH: 31.9 pg (ref 26.0–34.0)
MCHC: 32.1 g/dL (ref 30.0–36.0)
MCV: 99.4 fL (ref 80.0–100.0)
Platelets: 164 10*3/uL (ref 150–400)
RBC: 3.6 MIL/uL — ABNORMAL LOW (ref 3.87–5.11)
RDW: 13.9 % (ref 11.5–15.5)
WBC: 7.5 10*3/uL (ref 4.0–10.5)
nRBC: 0 % (ref 0.0–0.2)

## 2019-11-14 NOTE — Progress Notes (Signed)
Physical Therapy Treatment Patient Details Name: Margaret [name] MRN: [id number] DOB: 11-Nov-1940 Today's Date: 11/14/2019    History of Present Illness s/p R TKA. PMH: L TKA, pt reported hx syncopal episodes    PT Comments    Pt progressing however continues to require mod assist with transfers, posterior LOB with initial standing trial this pm. PT has purewick as she has been unable to get to BSC/bathroom with nursing staff.   amb  ~ 70' with RW and min assist, fatigues quickly, unsteady gait with LOB x1, chair follow d/t fatigue.    Pt continues to be a significant fall risk. Pt expresses she is very fearful of falling. Denies recent falls.  Recommend another night/additional PT tomorrow to allow for safe d/c home.   Follow Up Recommendations  Follow surgeon's recommendation for DC plan and follow-up therapies     Equipment Recommendations  None recommended by PT    Recommendations for Other Services       Precautions / Restrictions Precautions Precautions: Knee;Fall Precaution Comments: IND SLRs Knee Immobilizer - Right: Discontinue once straight leg raise with < 10 degree lag Restrictions Weight Bearing Restrictions: No Other Position/Activity Restrictions: WBAT    Mobility  Bed Mobility Overal bed mobility: Needs Assistance Bed Mobility: Sit to Supine       Sit to supine: Min guard   General bed mobility comments: assist with RLE, incr time and effort   Transfers Overall transfer level: Needs assistance Equipment used: Rolling walker (2 wheeled) Transfers: Sit to/from [facility] Sit to Stand: Mod assist Stand pivot transfers: Mod assist;+2 safety/equipment       General transfer comment: cues for hand placement, LLE position, assist with anterior-superior wt shift;  2  sit to stand trials, pt with posterior LOB on initial trial  Ambulation/Gait Ambulation/Gait assistance: Min assist Gait Distance (Feet): 40 Feet Assistive device:  Rolling walker (2 wheeled) Gait Pattern/deviations: Step-to pattern;Decreased stance time - right;Decreased weight shift to right;Decreased step length - right;Decreased step length - left Gait velocity: decr   General Gait Details: cues for sequence, safety, technique, assist with wt shifting and to maneuver RW. fatigues rapidly    Stairs             Wheelchair Mobility    Modified Rankin (Stroke Patients Only)       Balance           Standing balance support: During functional activity;Bilateral upper extremity supported Standing balance-Leahy Scale: Poor Standing balance comment: reliant on UEs and external support                             Cognition Arousal/Alertness: Awake/alert Behavior During Therapy: WFL for tasks assessed/performed Overall Cognitive Status: Within Functional Limits for tasks assessed                                        Exercises Total Joint Exercises Ankle Circles/Pumps: AROM;Both;10 reps    General Comments        Pertinent Vitals/Pain Pain Assessment: 0-10 Pain Score: 4  Pain Location: right knee  Pain Descriptors / Indicators: Sore Pain Intervention(s): Limited activity within patient's tolerance;Monitored during session;Premedicated before session;Repositioned;Ice applied    Home Living                      Prior Function  PT Goals (current goals can now be found in the care plan section) Acute Rehab PT Goals Patient Stated Goal: home soon PT Goal Formulation: With patient Time For Goal Achievement: 11/20/19 Potential to Achieve Goals: Good Progress towards PT goals: Progressing toward goals    Frequency    7X/week      PT Plan Current plan remains appropriate    Co-evaluation              AM-PAC PT "6 Clicks" Mobility   Outcome Measure  Help needed turning from your back to your side while in a flat bed without using bedrails?: A Little Help  needed moving from lying on your back to sitting on the side of a flat bed without using bedrails?: A Little Help needed moving to and from a bed to a chair (including a wheelchair)?: A Lot Help needed standing up from a chair using your arms (e.g., wheelchair or bedside chair)?: A Lot Help needed to walk in hospital room?: Total Help needed climbing 3-5 steps with a railing? : Total 6 Click Score: 12    End of Session Equipment Utilized During Treatment: Gait belt Activity Tolerance: Patient limited by pain;Patient limited by fatigue Patient left: with call bell/phone within reach;in bed;with bed alarm set Nurse Communication: Mobility status PT Visit Diagnosis: Muscle weakness (generalized) (M62.81);Unsteadiness on feet (R26.81)     Time: 3428-7681 PT Time Calculation (min) (ACUTE ONLY): 21 min  Charges:  $Gait Training: 8-22 mins                     Baxter Flattery, PT  Acute Rehab Dept ([place]) [phone] Pager [phone]  11/14/2019    [facility] 11/14/2019, 3:34 PM

## 2019-11-14 NOTE — TOC Transition Note (Signed)
Transition of Care [facility]) - CM/SW Discharge Note   Patient Details  Name: Margaret [name] MRN: [id number] Date of Birth: 09-11-1940  [facility]) CM/SW Contact:  Lennart Pall, LCSW Phone Number: 11/14/2019, 10:22 AM   Clinical Narrative:  Met with pt to confirm DME needs (see below).  She will be doing OPPT at [facility].  No further TOC needs.      Final next level of care: OP Rehab Barriers to Discharge: No Barriers Identified   Patient Goals and CMS Choice Patient states their goals for this hospitalization and ongoing recovery are:: go home      Discharge Placement                       Discharge Plan and Services                DME Arranged: 3-N-1 DME Agency: [employer] Date DME Agency Contacted: 11/14/19 Time DME Agency Contacted: 0900 Representative spoke with at DME Agency: Ovid Curd HH Arranged: NA [place] Agency: NA        Social Determinants of Health ([place]) Interventions     Readmission Risk Interventions No flowsheet data found.

## 2019-11-14 NOTE — Progress Notes (Signed)
Pt has had slower progression with physical therapy, is not yet meeting goals to be cleared for discharge. Will stay overnight and continue working with therapy tomorrow.   Theresa Duty, PA-C Orthopedic Surgery [facility] [place]

## 2019-11-14 NOTE — Progress Notes (Signed)
   Subjective: 1 Day Post-Op Procedure(s) (LRB): TOTAL KNEE ARTHROPLASTY SDDC (Right) Patient reports pain as mild.   Patient seen in rounds by Dr. Wynelle Link. Patient is well, and has had no acute complaints or problems. Had a syncopal event yesterday, preventing same day discharge. Feeling much better this AM. Denies chest pain, SOB, or calf pain. We will continue therapy today.   Objective: Vital signs in last 24 hours: Temp:  [97.5 F (36.4 C)-99.5 F (37.5 C)] 99.5 F (37.5 C) (06/15 0517) Pulse Rate:  [53-77] 60 (06/15 0517) Resp:  [8-19] 16 (06/15 0517) BP: (118-165)/(60-99) 122/69 (06/15 0517) SpO2:  [95 %-100 %] 99 % (06/15 0517)  Intake/Output from previous day:  Intake/Output Summary (Last 24 hours) at 11/14/2019 0722 Last data filed at 11/14/2019 0600 Gross per 24 hour  Intake 3556.25 ml  Output 2750 ml  Net 806.25 ml     Intake/Output this shift: No intake/output data recorded.  Labs: Recent Labs    11/14/19 0330  HGB 11.5*   Recent Labs    11/14/19 0330  WBC 7.5  RBC 3.60*  HCT 35.8*  PLT 164   Recent Labs    11/14/19 0330  NA 142  K 3.5  CL 107  CO2 27  BUN 21  CREATININE 0.99  GLUCOSE 113*  CALCIUM 8.3*   No results for input(s): LABPT, INR in the last 72 hours.  Exam: General - Patient is Alert and Oriented Extremity - Neurologically intact Neurovascular intact Sensation intact distally Dorsiflexion/Plantar flexion intact Dressing - dressing C/D/I Motor Function - intact, moving foot and toes well on exam.   Past Medical History:  Diagnosis Date  . Anxiety   . Arthritis   . Cancer (HCC)    lt. foot melanoma  . Chronic kidney disease    lt. kidney cyst  . History of healed stress fracture 2013   bilateral feet  . Hx of phlebitis    reports remote hx of "superfical blood clots" never anticoagulated  . Hypertension   . Osteopenia     Assessment/Plan: 1 Day Post-Op Procedure(s) (LRB): TOTAL KNEE ARTHROPLASTY SDDC  (Right) Principal Problem:   OA (osteoarthritis) of knee Active Problems:   Total knee replacement status, right  Estimated body mass index is 30.15 kg/m as calculated from the following:   Height as of this encounter: 5\' 3"  (1.6 m).   Weight as of this encounter: 77.2 kg. Advance diet Up with therapy D/C IV fluids   Patient's anticipated LOS is less than 2 midnights, meeting these requirements: - Younger than 64 - Lives within 1 hour of care - Has a competent adult at home to recover with post-op recover - NO history of  - Chronic pain requiring opiods  - Diabetes  - Coronary Artery Disease  - Heart failure  - Heart attack  - Stroke  - DVT/VTE  - Cardiac arrhythmia  - Respiratory Failure/COPD  - Renal failure  - Anemia  - Advanced Liver disease  DVT Prophylaxis - Xarelto Weight bearing as tolerated. D/C O2 and pulse ox and try on room air. Continue therapy.  Plan is to go Home after hospital stay. If she has another syncopal event, we will consult medicine for further workup. Otherwise we will plan for discharge later today once meeting goals. Scheduled for outpatient physical therapy at [facility]) Follow-up in the office on June 29  Theresa Duty, [place] [facility] [phone] 11/14/2019, 7:22 AM

## 2019-11-14 NOTE — Progress Notes (Signed)
Physical Therapy Treatment Patient Details Name: Margaret [name] MRN: [id number] DOB: 1940-09-26 Today's Date: 11/14/2019    History of Present Illness s/p R TKA. PMH: L TKA, pt reported hx syncopal episodes    PT Comments    Pt requiring mod assist with transfers this am. Pain and fatigue limiting session. Did very well with TKA HEP. No dizziness with position changes. Will see again this pm for possible d/c    Follow Up Recommendations  Follow surgeon's recommendation for DC plan and follow-up therapies     Equipment Recommendations  None recommended by PT    Recommendations for Other Services       Precautions / Restrictions Precautions Precautions: Knee;Fall Precaution Comments: IND SLRs Knee Immobilizer - Right: Discontinue once straight leg raise with < 10 degree lag Restrictions Weight Bearing Restrictions: No Other Position/Activity Restrictions: WBAT    Mobility  Bed Mobility Overal bed mobility: Needs Assistance Bed Mobility: Supine to Sit     Supine to sit: Min assist     General bed mobility comments: assist with RLE, incr time and effort   Transfers Overall transfer level: Needs assistance Equipment used: Rolling walker (2 wheeled) Transfers: Sit to/from [facility] Sit to Stand: Mod assist Stand pivot transfers: Mod assist       General transfer comment: cues for hand placement, LLE position, assist with anterior-superior wt shift   Ambulation/Gait Ambulation/Gait assistance: Mod assist Gait Distance (Feet): 3 Feet (pivotal steps ) Assistive device: Rolling walker (2 wheeled) Gait Pattern/deviations: Step-to pattern;Decreased stance time - right;Decreased weight shift to right     General Gait Details: cues for sequence, safety, technique, assist with wt shifting and to maneuver RW    Stairs             Wheelchair Mobility    Modified Rankin (Stroke Patients Only)       Balance                                             Cognition Arousal/Alertness: Awake/alert Behavior During Therapy: WFL for tasks assessed/performed Overall Cognitive Status: Within Functional Limits for tasks assessed                                        Exercises Total Joint Exercises Ankle Circles/Pumps: AROM;Both;10 reps Quad Sets: AROM;Both;10 reps Heel Slides: AAROM;Right;10 reps Hip ABduction/ADduction: AAROM;Right;10 reps Straight Leg Raises: AROM;Right;10 reps;AAROM Goniometric ROM: ~12 to 60 degrees rt knee    General Comments        Pertinent Vitals/Pain Pain Assessment: 0-10 Pain Score: 3  Pain Location: right ankle Pain Descriptors / Indicators: Sore Pain Intervention(s): Limited activity within patient's tolerance;Monitored during session;Premedicated before session;Repositioned;Patient requesting pain meds-RN notified;RN gave pain meds during session    Home Living                      Prior Function            PT Goals (current goals can now be found in the care plan section) Acute Rehab PT Goals Patient Stated Goal: home soon PT Goal Formulation: With patient Time For Goal Achievement: 11/20/19 Potential to Achieve Goals: Good Progress towards PT goals: Progressing toward goals    Frequency    7X/week  PT Plan Current plan remains appropriate    Co-evaluation              AM-PAC PT "6 Clicks" Mobility   Outcome Measure  Help needed turning from your back to your side while in a flat bed without using bedrails?: A Little Help needed moving from lying on your back to sitting on the side of a flat bed without using bedrails?: A Little Help needed moving to and from a bed to a chair (including a wheelchair)?: A Lot Help needed standing up from a chair using your arms (e.g., wheelchair or bedside chair)?: A Lot Help needed to walk in hospital room?: Total Help needed climbing 3-5 steps with a railing? : Total 6 Click  Score: 12    End of Session Equipment Utilized During Treatment: Gait belt Activity Tolerance: Patient limited by pain;Patient limited by fatigue Patient left: in chair;with call bell/phone within reach;with chair alarm set Nurse Communication: Mobility status PT Visit Diagnosis: Muscle weakness (generalized) (M62.81);Unsteadiness on feet (R26.81)     Time: 6387-5643 PT Time Calculation (min) (ACUTE ONLY): 29 min  Charges:  $Gait Training: 8-22 mins $Therapeutic Exercise: 8-22 mins                     Baxter Flattery, PT  Acute Rehab Dept ([place]) [phone] Pager [phone]  11/14/2019    [facility] 11/14/2019, 10:37 AM

## 2019-11-15 ENCOUNTER — Ambulatory Visit: Payer: Medicare Other | Admitting: Physical Therapy

## 2019-11-15 LAB — CBC
HCT: 36.7 % (ref 36.0–46.0)
Hemoglobin: 12 g/dL (ref 12.0–15.0)
MCH: 31.9 pg (ref 26.0–34.0)
MCHC: 32.7 g/dL (ref 30.0–36.0)
MCV: 97.6 fL (ref 80.0–100.0)
Platelets: 178 10*3/uL (ref 150–400)
RBC: 3.76 MIL/uL — ABNORMAL LOW (ref 3.87–5.11)
RDW: 13.7 % (ref 11.5–15.5)
WBC: 10.1 10*3/uL (ref 4.0–10.5)
nRBC: 0 % (ref 0.0–0.2)

## 2019-11-15 LAB — BASIC METABOLIC PANEL
Anion gap: 10 (ref 5–15)
BUN: 20 mg/dL (ref 8–23)
CO2: 25 mmol/L (ref 22–32)
Calcium: 8.5 mg/dL — ABNORMAL LOW (ref 8.9–10.3)
Chloride: 104 mmol/L (ref 98–111)
Creatinine, Ser: 0.94 mg/dL (ref 0.44–1.00)
GFR calc Af Amer: >60 mL/min (ref >60)
GFR calc non Af Amer: 58 mL/min — ABNORMAL LOW (ref >60)
Glucose, Bld: 143 mg/dL — ABNORMAL HIGH (ref 70–99)
Potassium: 3.6 mmol/L (ref 3.5–5.1)
Sodium: 139 mmol/L (ref 135–145)

## 2019-11-15 NOTE — Progress Notes (Signed)
   Subjective: 2 Days Post-Op Procedure(s) (LRB): TOTAL KNEE ARTHROPLASTY SDDC (Right) Patient reports pain as 10 on 0-10 scale.   Patient seen in rounds for Dr. Wynelle Link. Patient is well, and has had no acute complaints or problems. No acute events overnight. Voiding without difficulty. Ambulated 40 feet with PT yesterday. Denies CP, SHOB, N/V.  Plan is to go Home after hospital stay.  Objective: Vital signs in last 24 hours: Temp:  [98.9 F (37.2 C)-99.5 F (37.5 C)] 99.5 F (37.5 C) (06/16 0508) Pulse Rate:  [67-74] 74 (06/16 0508) Resp:  [17-18] 18 (06/16 0508) BP: (116-135)/(68-90) 135/74 (06/16 0508) SpO2:  [93 %-98 %] 93 % (06/16 0508)  Intake/Output from previous day:  Intake/Output Summary (Last 24 hours) at 11/15/2019 0815 Last data filed at 11/15/2019 0600 Gross per 24 hour  Intake 760 ml  Output 850 ml  Net -90 ml    Intake/Output this shift: No intake/output data recorded.  Labs: Recent Labs    11/14/19 0330 11/15/19 0245  HGB 11.5* 12.0   Recent Labs    11/14/19 0330 11/15/19 0245  WBC 7.5 10.1  RBC 3.60* 3.76*  HCT 35.8* 36.7  PLT 164 178   Recent Labs    11/14/19 0330 11/15/19 0245  NA 142 139  K 3.5 3.6  CL 107 104  CO2 27 25  BUN 21 20  CREATININE 0.99 0.94  GLUCOSE 113* 143*  CALCIUM 8.3* 8.5*   No results for input(s): LABPT, INR in the last 72 hours.  Exam: General - Patient is Alert and Oriented Extremity - Neurologically intact Sensation intact distally Intact pulses distally Dorsiflexion/Plantar flexion intact Dressing/Incision - clean, dry, no drainage Motor Function - intact, moving foot and toes well on exam.   Past Medical History:  Diagnosis Date  . Anxiety   . Arthritis   . Cancer (HCC)    lt. foot melanoma  . Chronic kidney disease    lt. kidney cyst  . History of healed stress fracture 2013   bilateral feet  . Hx of phlebitis    reports remote hx of "superfical blood clots" never anticoagulated  .  Hypertension   . Osteopenia     Assessment/Plan: 2 Days Post-Op Procedure(s) (LRB): TOTAL KNEE ARTHROPLASTY SDDC (Right) Principal Problem:   OA (osteoarthritis) of knee Active Problems:   Total knee replacement status, right  Estimated body mass index is 30.15 kg/m as calculated from the following:   Height as of this encounter: 5\' 3"  (1.6 m).   Weight as of this encounter: 77.2 kg. Advance diet Up with therapy D/C IV fluids  DVT Prophylaxis - Xarelto Weight-bearing as tolerated  Plan for discharge today following 1-2 sessions of therapy as long as she continues to meet her goals and is otherwise safe to discharge home. She is scheduled for OPPT. Follow up in the office on June 29.   Griffith Citron, PA-C Orthopedic Surgery [phone] 11/15/2019, 8:15 AM

## 2019-11-15 NOTE — Plan of Care (Signed)
Pt ready for DC home 

## 2019-11-15 NOTE — Progress Notes (Signed)
Physical Therapy Treatment Patient Details Name: Margaret [name] MRN: [id number] DOB: [birth year] Today's Date: 11/15/2019    History of Present Illness s/p R TKA. PMH: L TKA, pt reported hx syncopal episodes    PT Comments    Pt progressing. decr assist overall however still recommend 24 hour supervision for safety d/t pt at high risk for falls.   Follow Up Recommendations  Follow surgeon's recommendation for DC plan and follow-up therapies; 24 hour supervision     Equipment Recommendations  None recommended by PT    Recommendations for Other Services       Precautions / Restrictions Precautions Precautions: Knee;Fall Precaution Comments: IND SLRs Knee Immobilizer - Right: Discontinue once straight leg raise with < 10 degree lag Restrictions Weight Bearing Restrictions: No Other Position/Activity Restrictions: WBAT    Mobility  Bed Mobility Overal bed mobility: Needs Assistance Bed Mobility: Supine to Sit     Supine to sit: Min guard;HOB elevated;Min assist     General bed mobility comments: incr time and effort,  able to transition to sitting without physical assitst, no use of rails.. bed pad utilized to complete scooting  to EOB  Transfers Overall transfer level: Needs assistance Equipment used: Rolling walker (2 wheeled) Transfers: Sit to/from Stand Sit to Stand: Min guard         General transfer comment: incr time, cues for hand placement,  anterior wt shift   Ambulation/Gait Ambulation/Gait assistance: Min guard Gait Distance (Feet): 45 Feet Assistive device: Rolling walker (2 wheeled) Gait Pattern/deviations: Step-to pattern;Decreased stance time - right;Decreased weight shift to right;Decreased step length - right;Decreased step length - left Gait velocity: decr   General Gait Details: cues for sequence, safety, technique, min/guard for safety with  wt shifting and to maneuver RW.  tolerance improving.  unsteady gait, without overt  LOB   Stairs             Wheelchair Mobility    Modified Rankin (Stroke Patients Only)       Balance             Standing balance-Leahy Scale: Poor Standing balance comment: reliant on UEs and external support                             Cognition Arousal/Alertness: Awake/alert Behavior During Therapy: WFL for tasks assessed/performed Overall Cognitive Status: Within Functional Limits for tasks assessed                                        Exercises Total Joint Exercises Ankle Circles/Pumps: AROM;Both;10 reps Quad Sets: AROM;Both;10 reps    General Comments        Pertinent Vitals/Pain Pain Assessment: 0-10 Pain Score: 6  Pain Location: right knee  Pain Descriptors / Indicators: Sore Pain Intervention(s): Limited activity within patient's tolerance;Monitored during session;Premedicated before session;Repositioned;Ice applied    Home Living                      Prior Function            PT Goals (current goals can now be found in the care plan section) Acute Rehab PT Goals Patient Stated Goal: home soon PT Goal Formulation: With patient Time For Goal Achievement: 11/20/19 Potential to Achieve Goals: Good Progress towards PT goals: Progressing toward goals    Frequency  7X/week      PT Plan Current plan remains appropriate    Co-evaluation              AM-PAC PT "6 Clicks" Mobility   Outcome Measure  Help needed turning from your back to your side while in a flat bed without using bedrails?: A Little Help needed moving from lying on your back to sitting on the side of a flat bed without using bedrails?: A Little Help needed moving to and from a bed to a chair (including a wheelchair)?: A Little Help needed standing up from a chair using your arms (e.g., wheelchair or bedside chair)?: A Little Help needed to walk in hospital room?: A Little Help needed climbing 3-5 steps with a railing? :  A Lot 6 Click Score: 17    End of Session Equipment Utilized During Treatment: Gait belt Activity Tolerance: Patient tolerated treatment well Patient left: in chair;with call bell/phone within reach;with chair alarm set Nurse Communication: Mobility status PT Visit Diagnosis: Muscle weakness (generalized) (M62.81);Unsteadiness on feet (R26.81)     Time: 0938-1829 PT Time Calculation (min) (ACUTE ONLY): 27 min  Charges:  $Gait Training: 23-37 mins                     Baxter Flattery, PT  Acute Rehab Dept ([place]) [phone] Pager [phone]  11/15/2019    [facility] 11/15/2019, 11:45 AM

## 2019-11-15 NOTE — Progress Notes (Signed)
Physical Therapy Treatment Patient Details Name: Margaret [name] MRN: [id number] DOB: [birth year] Today's Date: 11/15/2019    History of Present Illness s/p R TKA. PMH: L TKA, pt reported hx syncopal episodes    PT Comments    Pt progressing. Recommend 24 hour supervision/close guarding for all mobility  at home for safety and fall prevention. Discussed with pt and RN. Pt plans to sleep in recliner for  A few days since she has stairs to get into bed (no steps to get into the house).    Follow Up Recommendations  Follow surgeon's recommendation for DC plan and follow-up therapies     Equipment Recommendations  None recommended by PT    Recommendations for Other Services       Precautions / Restrictions Precautions Precautions: Knee;Fall Precaution Comments: IND SLRs Knee Immobilizer - Right: Discontinue once straight leg raise with < 10 degree lag Restrictions Other Position/Activity Restrictions: WBAT    Mobility  Bed Mobility Overal bed mobility: Needs Assistance Bed Mobility: Supine to Sit     Supine to sit: Min guard;HOB elevated;Min assist     General bed mobility comments: in recliner  Transfers Overall transfer level: Needs assistance Equipment used: Rolling walker (2 wheeled) Transfers: Sit to/from Stand Sit to Stand: Min guard         General transfer comment: incr time, cues for hand placement,  anterior wt shift  and to control descent   Ambulation/Gait Ambulation/Gait assistance: Min guard Gait Distance (Feet): 40 Feet Assistive device: Rolling walker (2 wheeled) Gait Pattern/deviations: Step-to pattern;Decreased stance time - right;Decreased weight shift to right;Decreased step length - right;Decreased step length - left Gait velocity: decr   General Gait Details: cues for sequence, safety, technique, min/guard for safety with  wt shifting and to maneuver RW, especially with posterior technique.  tolerance unsteady gait, without overt  LOB   Stairs             Wheelchair Mobility    Modified Rankin (Stroke Patients Only)       Balance             Standing balance-Leahy Scale: Fair Standing balance comment: poor to fair, pt is briefly able to stand and don mask without UE support, close guarding for safety; reliant on UEs for dynamic tasks                             Cognition Arousal/Alertness: Awake/alert Behavior During Therapy: WFL for tasks assessed/performed Overall Cognitive Status: Within Functional Limits for tasks assessed                                        Exercises Total Joint Exercises Ankle Circles/Pumps: AROM;Both;10 reps Quad Sets: AROM;Both;10 reps    General Comments        Pertinent Vitals/Pain Pain Assessment: 0-10 Pain Score: 4  Pain Location: right knee  Pain Descriptors / Indicators: Sore Pain Intervention(s): Limited activity within patient's tolerance;Monitored during session;Repositioned    Home Living                      Prior Function            PT Goals (current goals can now be found in the care plan section) Acute Rehab PT Goals Patient Stated Goal: home soon PT Goal Formulation: With patient  Time For Goal Achievement: 11/20/19 Potential to Achieve Goals: Good Progress towards PT goals: Progressing toward goals    Frequency    7X/week      PT Plan Current plan remains appropriate    Co-evaluation              AM-PAC PT "6 Clicks" Mobility   Outcome Measure  Help needed turning from your back to your side while in a flat bed without using bedrails?: A Little Help needed moving from lying on your back to sitting on the side of a flat bed without using bedrails?: A Little Help needed moving to and from a bed to a chair (including a wheelchair)?: A Little Help needed standing up from a chair using your arms (e.g., wheelchair or bedside chair)?: A Little Help needed to walk in hospital room?:  A Little Help needed climbing 3-5 steps with a railing? : A Lot 6 Click Score: 17    End of Session Equipment Utilized During Treatment: Gait belt Activity Tolerance: Patient tolerated treatment well Patient left: in chair;with call bell/phone within reach;with chair alarm set Nurse Communication: Mobility status PT Visit Diagnosis: Muscle weakness (generalized) (M62.81);Unsteadiness on feet (R26.81)     Time: 8295-6213 PT Time Calculation (min) (ACUTE ONLY): 27 min  Charges:  $Gait Training: 8-22 mins $Therapeutic Exercise: 8-22 mins                     Baxter Flattery, PT  Acute Rehab Dept ([place]) [phone] Pager [phone]  11/15/2019    [facility] 11/15/2019, 2:03 PM

## 2019-11-16 NOTE — H&P (Signed)
TOTAL HIP ADMISSION H&P  Patient is admitted for right total hip arthroplasty.  Subjective:  Chief Complaint: right hip pain  HPI: Margaret [name], 79 y.o. female, has a history of pain and functional disability in the right hip(s) due to arthritis and patient has failed non-surgical conservative treatments for greater than 12 weeks to include corticosteriod injections and activity modification.  Onset of symptoms was gradual starting 3 years ago with gradually worsening course since that time.The patient noted no past surgery on the right hip(s).  Patient currently rates pain in the right hip at 7 out of 10 with activity. Patient has worsening of pain with activity and weight bearing and pain that interfers with activities of daily living. Patient has evidence of joint space narrowing by imaging studies. This condition presents safety issues increasing the risk of falls.  There is no current active infection.  Patient Active Problem List   Diagnosis Date Noted  . OA (osteoarthritis) of knee 11/13/2019  . Total knee replacement status, 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  . Encounter for postoperative examination after surgery for malignant neoplasm 09/30/2018  . Malignant melanoma of left foot ([facility]) 08/15/2018  . Plantar fasciitis of right foot 02/03/2017  . Porokeratosis 02/03/2017  . Acute pancreatitis due to calculus of common bile duct 09/17/2016  . Acute gallstone pancreatitis   . Calculus of gallbladder with acute cholecystitis and obstruction   . LFT elevation   . Stress fracture 01/01/2016  . Insomnia 12/16/2015  . Peripheral neuropathy 10/18/2015  . Renal insufficiency 03/06/2015  . HTN (hypertension) 03/06/2015  . Osteopenia 03/06/2015  . GERD (gastroesophageal reflux disease) 03/06/2015  . Vaginal pessary present 03/06/2015   Past Medical History:  Diagnosis Date  . Anxiety   . Arthritis   . Cancer (HCC)    lt. foot melanoma  . Chronic kidney disease    lt. kidney cyst  . History  of healed stress fracture 2013   bilateral feet  . Hx of phlebitis    reports remote hx of "superfical blood clots" never anticoagulated  . Hypertension   . Osteopenia     Past Surgical History:  Procedure Laterality Date  . CHOLECYSTECTOMY N/A 09/19/2016   Procedure: LAPAROSCOPIC CHOLECYSTECTOMY POSSIBLE INTRAOPERATIVE CHOLANGIOGRAM;  Surgeon: Rolm Bookbinder, MD;  Location: [facility];  Service: General;  Laterality: N/A;  . ERCP N/A 09/18/2016   Procedure: ENDOSCOPIC RETROGRADE CHOLANGIOPANCREATOGRAPHY (ERCP);  Surgeon: Doran Stabler, MD;  Location[facility] END[facility]COPY;  Service: Endoscopy;  Laterality: N/A;  . FOOT SURGERY     patient reports four foot surgeries  . FOOT SURGERY Left    bunion, hammer toe surgery  . FOOT SURGERY Right    shaved bunion, hammer toe correction  . KNEE ARTHROSCOPY Right 1997  . REPLACEMENT TOTAL KNEE Left   . 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 RETINAL DETACHMENT SURGERY    . RETINAL DETACHMENT SURGERY Left 2008  . THYROIDECTOMY, PARTIAL Right   . THYROIDECTOMY, PARTIAL  2005  . TOTAL KNEE ARTHROPLASTY Right 11/13/2019   Procedure: TOTAL KNEE ARTHROPLASTY SDDC;  Surgeon: Gaynelle Arabian, MD;  Location: [facility];  Service: Orthopedics;  Laterality: Right;  19min    No current facility-administered medications for this encounter.   Current Outpatient Medications  Medication Sig Dispense Refill Last Dose  . acetaminophen (TYLENOL) 500 MG tablet Take 1,000 mg by mouth every 6 (six) hours as needed (pain.).    11/12/2019 at Unknown time  . hydrochlorothiazide (MICROZIDE) 12.5 MG capsule TAKE 1 CAPSULE(12.5 MG)  BY MOUTH DAILY (Patient taking differently: Take 12.5 mg by mouth daily. ) 30 capsule 6 11/12/2019 at Unknown time  . ketoconazole (NIZORAL) 2 % cream Apply 1 application topically 2 (two) times daily as needed (skin irritation/rash.).    11/12/2019 at Unknown time  . LORazepam (ATIVAN) 2 MG tablet TAKE 1-1&1/2 TABLETS BY MOUTH EVERY NIGHT AT BEDTIME AS NEEDED  FOR SLEEP MAY TAKE 1 TABLET DURING DAY IF NEEDED 70 tablet 2 11/12/2019 at Unknown time  . metoprolol succinate (TOPROL-XL) 25 MG 24 hr tablet TAKE 1 TABLET(25 MG) BY MOUTH DAILY (Patient taking differently: Take 25 mg by mouth daily. ) 90 tablet 1 11/13/2019 at 0415  . NONFORMULARY OR COMPOUNDED ITEM [facility]:  Peripheral Neuropathy - Bupivacaine 1%, Doxepin 3%, Gabapentin 6%, Pentoxifylline 3%, Topiramate 1%. Apply 1-2 grams to affected area 3-4 times daily. (Patient taking differently: Apply 1 application topically 4 (four) times daily as needed (neuropathy/pain (feet)). [facility]:  Peripheral Neuropathy - Bupivacaine 1%, Doxepin 3%, Gabapentin 6%, Pentoxifylline 3%, Topiramate 1%. Apply 1-2 grams to affected area 3-4 times daily.) 100 each 11 4-5 days ago  . omeprazole (PRILOSEC) 20 MG capsule TAKE 1 CAPSULE BY MOUTH EVERY DAY AS NEEDED (Patient taking differently: Take 20 mg by mouth daily. ) 90 capsule 3 11/13/2019 at 0415  . pregabalin (LYRICA) 75 MG capsule Take 1 capsule (75 mg total) by mouth 3 (three) times daily. 90 capsule 5 11/13/2019 at 0415  . HYDROmorphone (DILAUDID) 2 MG tablet Take 1-2 tablets (2-4 mg total) by mouth every 6 (six) hours as needed for up to 5 days for severe pain. 56 tablet 0   . methocarbamol (ROBAXIN) 500 MG tablet Take 1 tablet (500 mg total) by mouth every 6 (six) hours as needed for muscle spasms. 40 tablet 0   . rivaroxaban (XARELTO) 10 MG TABS tablet Take 1 tablet (10 mg total) by mouth daily. Then take one 81 mg aspirin once a day for three weeks. Then discontinue aspirin. 21 tablet 0   . traMADol (ULTRAM) 50 MG tablet Take 1-2 tablets (50-100 mg total) by mouth every 6 (six) hours as needed for moderate pain. 40 tablet 0    Allergies  Allergen Reactions  . Keflex [Cephalexin] Diarrhea  . Percocet [Oxycodone-Acetaminophen] Other (See Comments)    Syncope (passed out)    Social History   Tobacco Use  . Smoking status: Former Smoker     Quit date: 06/02/1979    Years since quitting: 40.4  . Smokeless tobacco: Never Used  Substance Use Topics  . Alcohol use: Yes    Alcohol/week: 1.0 standard drink    Types: 1 Glasses of wine per week    Comment: Occ    Family History  Problem Relation Age of Onset  . Heart disease Mother   . Heart attack Mother   . Heart disease Father   . Diabetes Maternal Grandmother   . Cancer Paternal Grandfather        stomach     Review of Systems  Constitutional: Negative for chills and fever.  Respiratory: Negative for cough and shortness of breath.   Cardiovascular: Negative for chest pain.  Gastrointestinal: Negative for nausea and vomiting.  Musculoskeletal: Positive for arthralgias.    Objective:  Physical Exam Patient is a 79 year old female.  Well nourished and well developed. General: Alert and oriented x3, cooperative and pleasant, no acute distress. Head: normocephalic, atraumatic, neck supple. Eyes: EOMI. Respiratory: breath sounds clear in all fields, no wheezing, rales,  or rhonchi. Cardiovascular: Regular rate and rhythm, no murmurs, gallops or rubs. Abdomen: non-tender to palpation and soft, normoactive bowel sounds.  Musculoskeletal: Right Hip Exam: The range of motion: Flexion to 90 degrees, Internal Rotation to 0 degrees, External Rotation to 0 degrees, and abduction to 5 degrees without discomfort. There is slight tenderness over the greater trochanteric bursa.  Calves soft and nontender. Motor function intact in LE. Strength 5/5 LE bilaterally. Neuro: Distal pulses 2+. Sensation to light touch intact in LE.  Vital signs in last 24 hours:    Labs:  Estimated body mass index is 30.15 kg/m as calculated from the following:   Height as of this encounter: 5\' 3"  (1.6 m).   Weight as of this encounter: 77.2 kg.   Imaging Review Plain radiographs demonstrate severe degenerative joint disease of the right hip(s). The bone quality appears to be adequate for  age and reported activity level.  Assessment/Plan:  End stage arthritis, right hip(s)  The patient history, physical examination, clinical judgement of the provider and imaging studies are consistent with end stage degenerative joint disease of the right hip(s) and total hip arthroplasty is deemed medically necessary. The treatment options including medical management, injection therapy, arthroscopy and arthroplasty were discussed at length. The risks and benefits of total hip arthroplasty were presented and reviewed. The risks due to aseptic loosening, infection, stiffness, dislocation/subluxation,  thromboembolic complications and other imponderables were discussed.  The patient acknowledged the explanation, agreed to proceed with the plan and consent was signed. Patient is being admitted for inpatient treatment for surgery, pain control, PT, OT, prophylactic antibiotics, VTE prophylaxis, progressive ambulation and ADL's and discharge planning.The patient is planning to be discharged home.   Therapy Plans: HEP Disposition: Lives alone, son, daughter and neighbors checking in Planned DVT Prophylaxis: aspirin 325mg  BID DME needed: none PCP: Cyndi Bender, PA-C, clearance received Cardiologist: Dr. Acie Fredrickson Pulmonologist: Dr. Lamonte Sakai TXA: IV Allergies: prednisone dose packs - keep her awake, NKDA Anesthesia Concerns: none BMI: 25 Not diabetic.  Other: Planning on staying overnight. On Percocet 5-325 TID since November 2020. Hx of COPD/asthma, sees Pulmonology. Hx of Sjorgen's disease & lupus.   - Patient was instructed on what medications to stop prior to surgery. - Follow-up visit in 2 weeks with Dr. Wynelle Link - Begin physical therapy following surgery - Pre-operative lab work as pre-surgical testing - Prescriptions will be provided in hospital at time of discharge  Griffith Citron, PA-C Orthopedic Surgery [facility] [phone]

## 2019-11-20 NOTE — Discharge Summary (Signed)
Physician Discharge Summary   Patient ID: Margaret [name] MRN: [id number] DOB/AGE: 1941/05/26 78 y.o.  Admit date: 11/13/2019 Discharge date: 11/15/2019  Primary Diagnosis: Osteoarthritis, right knee   Admission Diagnoses:  Past Medical History:  Diagnosis Date  . Anxiety   . Arthritis   . Cancer (HCC)    lt. foot melanoma  . Chronic kidney disease    lt. kidney cyst  . History of healed stress fracture 2013   bilateral feet  . Hx of phlebitis    reports remote hx of "superfical blood clots" never anticoagulated  . Hypertension   . Osteopenia    Discharge Diagnoses:   Principal Problem:   OA (osteoarthritis) of knee Active Problems:   Total knee replacement status, right  Estimated body mass index is 30.15 kg/m as calculated from the following:   Height as of this encounter: 5\' 3"  (1.6 m).   Weight as of this encounter: 77.2 kg.  Procedure:  Procedure(s) (LRB): TOTAL KNEE ARTHROPLASTY SDDC (Right)   Consults: None  HPI: Margaret [name] is a 79 y.o. year old female with end stage OA of her right knee with progressively worsening pain and dysfunction. She has constant pain, with activity and at rest and significant functional deficits with difficulties even with ADLs. She has had extensive non-op management including analgesics, injections of cortisone and viscosupplements, and home exercise program, but remains in significant pain with significant dysfunction.Radiographs show bone on bone arthritis all 3 compartments. She presents now for right Total Knee Arthroplasty.    Laboratory Data: Admission on 11/13/2019, Discharged on 11/15/2019  Component Date Value Ref Range Status  . Glucose-Capillary 11/13/2019 131* 70 - 99 mg/dL Final   Glucose reference range applies only to samples taken after fasting for at least 8 hours.  . WBC 11/14/2019 7.5  4.0 - 10.5 K/uL Final  . RBC 11/14/2019 3.60* 3.87 - 5.11 MIL/uL Final  . Hemoglobin 11/14/2019 11.5* 12.0 - 15.0 g/dL  Final  . HCT 11/14/2019 35.8* 36 - 46 % Final  . MCV 11/14/2019 99.4  80.0 - 100.0 fL Final  . MCH 11/14/2019 31.9  26.0 - 34.0 pg Final  . MCHC 11/14/2019 32.1  30.0 - 36.0 g/dL Final  . RDW 11/14/2019 13.9  11.5 - 15.5 % Final  . Platelets 11/14/2019 164  150 - 400 K/uL Final  . nRBC 11/14/2019 0.0  0.0 - 0.2 % Final   Performed at [facility], [place] [address], [place], [place] [address]  . Sodium 11/14/2019 142  135 - 145 mmol/L Final  . Potassium 11/14/2019 3.5  3.5 - 5.1 mmol/L Final  . Chloride 11/14/2019 107  98 - 111 mmol/L Final  . CO2 11/14/2019 27  22 - 32 mmol/L Final  . Glucose, Bld 11/14/2019 113* 70 - 99 mg/dL Final   Glucose reference range applies only to samples taken after fasting for at least 8 hours.  . BUN 11/14/2019 21  8 - 23 mg/dL Final  . Creatinine, Ser 11/14/2019 0.99  0.44 - 1.00 mg/dL Final  . Calcium 11/14/2019 8.3* 8.9 - 10.3 mg/dL Final  . GFR calc non Af Amer 11/14/2019 54* >60 mL/min Final  . GFR calc Af Amer 11/14/2019 >60  >60 mL/min Final  . Anion gap 11/14/2019 8  5 - 15 Final   Performed at [facility], [place] [address] [place], [place] [address]  . WBC 11/15/2019 10.1  4.0 - 10.5 K/uL Final  . RBC 11/15/2019 3.76*  3.87 - 5.11 MIL/uL Final  . Hemoglobin 11/15/2019 12.0  12.0 - 15.0 g/dL Final  . HCT 11/15/2019 36.7  36 - 46 % Final  . MCV 11/15/2019 97.6  80.0 - 100.0 fL Final  . MCH 11/15/2019 31.9  26.0 - 34.0 pg Final  . MCHC 11/15/2019 32.7  30.0 - 36.0 g/dL Final  . RDW 11/15/2019 13.7  11.5 - 15.5 % Final  . Platelets 11/15/2019 178  150 - 400 K/uL Final  . nRBC 11/15/2019 0.0  0.0 - 0.2 % Final   Performed at [facility], [place] [address], [place], [place] [address]  . Sodium 11/15/2019 139  135 - 145 mmol/L Final  . Potassium 11/15/2019 3.6  3.5 - 5.1 mmol/L Final  . Chloride 11/15/2019 104  98 - 111 mmol/L Final  . CO2 11/15/2019 25  22 - 32 mmol/L Final  . Glucose, Bld  11/15/2019 143* 70 - 99 mg/dL Final   Glucose reference range applies only to samples taken after fasting for at least 8 hours.  . BUN 11/15/2019 20  8 - 23 mg/dL Final  . Creatinine, Ser 11/15/2019 0.94  0.44 - 1.00 mg/dL Final  . Calcium 11/15/2019 8.5* 8.9 - 10.3 mg/dL Final  . GFR calc non Af Amer 11/15/2019 58* >60 mL/min Final  . GFR calc Af Amer 11/15/2019 >60  >60 mL/min Final  . Anion gap 11/15/2019 10  5 - 15 Final   Performed at [facility], [place] [address], [place], [place] [address]  Hospital Outpatient Visit on 11/09/2019  Component Date Value Ref Range Status  . SARS Coronavirus 2 11/09/2019 NEGATIVE  NEGATIVE Final   Comment: (NOTE) SARS-CoV-2 target nucleic acids are NOT DETECTED.  The SARS-CoV-2 RNA is generally detectable in upper and lower respiratory specimens during the acute phase of infection. Negative results do not preclude SARS-CoV-2 infection, do not rule out co-infections with other pathogens, and should not be used as the sole basis for treatment or other patient management decisions. Negative results must be combined with clinical observations, patient history, and epidemiological information. The expected result is Negative.  Fact Sheet for Patients: SugarRoll.be  Fact Sheet for Healthcare Providers: https://www.woods-mathews.com/  This test is not yet approved or cleared by the [place] FDA and  has been authorized for detection and/or diagnosis of SARS-CoV-2 by FDA under an Emergency Use Authorization (EUA). This EUA will remain  in effect (meaning this test can be used) for the duration of the COVID-19 declaration under Se                          ction 564(b)(1) of the Act, 21 U.S.C. section 360bbb-3(b)(1), unless the authorization is terminated or revoked sooner.  Performed at [facility], [place] [address], [place], [place] [address]   Hospital Outpatient Visit on  11/06/2019  Component Date Value Ref Range Status  . aPTT 11/06/2019 26  24 - 36 seconds Final   Performed at [facility], [place] [address] [place], [place] [address]  . WBC 11/06/2019 5.5  4.0 - 10.5 K/uL Final  . RBC 11/06/2019 4.14  3.87 - 5.11 MIL/uL Final  . Hemoglobin 11/06/2019 13.3  12.0 - 15.0 g/dL Final  . HCT 11/06/2019 41.5  36 - 46 % Final  . MCV 11/06/2019 100.2* 80.0 - 100.0 fL Final  . MCH 11/06/2019 32.1  26.0 - 34.0 pg Final  . MCHC 11/06/2019  32.0  30.0 - 36.0 g/dL Final  . RDW 11/06/2019 13.7  11.5 - 15.5 % Final  . Platelets 11/06/2019 231  150 - 400 K/uL Final  . nRBC 11/06/2019 0.0  0.0 - 0.2 % Final   Performed at [facility], [place] [address] [place], [place] [address]  . Sodium 11/06/2019 141  135 - 145 mmol/L Final  . Potassium 11/06/2019 3.7  3.5 - 5.1 mmol/L Final  . Chloride 11/06/2019 106  98 - 111 mmol/L Final  . CO2 11/06/2019 27  22 - 32 mmol/L Final  . Glucose, Bld 11/06/2019 148* 70 - 99 mg/dL Final   Glucose reference range applies only to samples taken after fasting for at least 8 hours.  . BUN 11/06/2019 25* 8 - 23 mg/dL Final  . Creatinine, Ser 11/06/2019 1.06* 0.44 - 1.00 mg/dL Final  . Calcium 11/06/2019 8.9  8.9 - 10.3 mg/dL Final  . Total Protein 11/06/2019 6.9  6.5 - 8.1 g/dL Final  . Albumin 11/06/2019 3.8  3.5 - 5.0 g/dL Final  . AST 11/06/2019 22  15 - 41 U/L Final  . ALT 11/06/2019 16  0 - 44 U/L Final  . Alkaline Phosphatase 11/06/2019 90  38 - 126 U/L Final  . Total Bilirubin 11/06/2019 0.6  0.3 - 1.2 mg/dL Final  . GFR calc non Af Amer 11/06/2019 50* >60 mL/min Final  . GFR calc Af Amer 11/06/2019 58* >60 mL/min Final  . Anion gap 11/06/2019 8  5 - 15 Final   Performed at [facility], [place] [address] [place], [place] [address]  . Prothrombin Time 11/06/2019 11.7  11.4 - 15.2 seconds Final  . INR 11/06/2019 0.9  0.8 - 1.2 Final   Comment: (NOTE) INR goal varies based on  device and disease states. Performed at [facility], [place] [address] [place], [place] [address]   . ABO/RH(D) 11/06/2019 O POS   Final  . Antibody Screen 11/06/2019 NEG   Final  . Sample Expiration 11/06/2019 11/16/2019,2359   Final  . Extend sample reason 11/06/2019    Final                   Value:NO TRANSFUSIONS OR PREGNANCY IN THE PAST 3 MONTHS Performed at [facility], [place] [address], [place], [place] [address]   . MRSA, PCR 11/06/2019 NEGATIVE  NEGATIVE Final  . Staphylococcus aureus 11/06/2019 NEGATIVE  NEGATIVE Final   Comment: (NOTE) The Xpert SA Assay (FDA approved for NASAL specimens in patients 26 years of age and older), is one component of a comprehensive surveillance program. It is not intended to diagnose infection nor to guide or monitor treatment. Performed at [facility], [place] [address] [place], [place] [address]   . ABO/RH(D) 11/06/2019    Final                   Value:O POS Performed at [facility], [place] [address], [place], [place] [address]      X-Rays:No results found.  EKG: Orders placed or performed during the hospital encounter of 11/06/19  . EKG 12 lead  . EKG 12 lead     Hospital Course: Margaret [name] is a 79 y.o. who was admitted to [facility]. They were brought to the operating room on 11/13/2019 and underwent Procedure(s): TOTAL KNEE ARTHROPLASTY SDDC.  Patient tolerated the procedure well and was later transferred to the recovery room  and then to the orthopaedic floor for postoperative care. They were given PO and IV analgesics for pain control following their surgery. They were given 24 hours of postoperative antibiotics of  Anti-infectives (From admission, onward)   Start     Dose/Rate Route Frequency Ordered Stop   11/13/19 1400  ceFAZolin (ANCEF) IVPB 2g/100 mL premix        2 g 200 mL/hr over 30 Minutes Intravenous Every 6 hours 11/13/19 1053  11/14/19 0159   11/13/19 0600  ceFAZolin (ANCEF) IVPB 2g/100 mL premix        2 g 200 mL/hr over 30 Minutes Intravenous On call to O.R. 11/13/19 0550 11/13/19 0719     and started on DVT prophylaxis in the form of Xarelto.   PT and OT were ordered for total joint protocol. Discharge planning consulted to help with postop disposition and equipment needs. Patient had a good night on the evening of surgery. They started to get up OOB with therapy on POD #0. Patient had a syncopal event while working with PT which prevented discharge home on same day as originally planned. She reports history of similar episodes which have been evaluted in the past. She worked with PT on POD #1 and was improving. Continued to work with therapy into POD #2. Pt was seen during rounds on day two and was ready to go home pending progress with therapy. Pt worked with therapy for two additional sessions and was meeting their goals. She was discharged to home later that day in stable condition.  Diet: Regular diet Activity: WBAT Follow-up: in 2 weeks Disposition: Home Discharged Condition: good   Discharge Instructions    Call MD / Call 911   Complete by: As directed    If you experience chest pain or shortness of breath, CALL 911 and be transported to the hospital emergency room.  If you develope a fever above 101 F, pus (white drainage) or increased drainage or redness at the wound, or calf pain, call your surgeon's office.   Change dressing   Complete by: As directed    You may remove the bulky bandage (ACE wrap and gauze) two days after surgery. You will have an adhesive waterproof bandage underneath. Leave this in place until your first follow-up appointment.   Constipation Prevention   Complete by: As directed    Drink plenty of fluids.  Prune juice may be helpful.  You may use a stool softener, such as Colace (over the counter) 100 mg twice a day.  Use MiraLax (over the counter) for constipation as needed.   Diet  - low sodium heart healthy   Complete by: As directed    Do not put a pillow under the knee. Place it under the heel.   Complete by: As directed    Driving restrictions   Complete by: As directed    No driving for two weeks   TED hose   Complete by: As directed    Use stockings (TED hose) for three weeks on both leg(s).  You may remove them at night for sleeping.   Weight bearing as tolerated   Complete by: As directed      Allergies as of 11/15/2019      Reactions   Keflex [cephalexin] Diarrhea   Percocet [oxycodone-acetaminophen] Other (See Comments)   Syncope (passed out)      Medication List    STOP taking these medications   Calcium High Potency/Vitamin D 600-200 MG-UNIT Tabs Generic drug: Calcium  Carbonate-Vitamin D   CRANBERRY PO   multivitamin with minerals Tabs tablet   Voltaren 1 % Gel Generic drug: diclofenac Sodium     TAKE these medications   acetaminophen 500 MG tablet Commonly known as: TYLENOL Take 1,000 mg by mouth every 6 (six) hours as needed (pain.).   hydrochlorothiazide 12.5 MG capsule Commonly known as: MICROZIDE TAKE 1 CAPSULE(12.5 MG) BY MOUTH DAILY What changed:   how much to take  how to take this  when to take this  additional instructions   ketoconazole 2 % cream Commonly known as: NIZORAL Apply 1 application topically 2 (two) times daily as needed (skin irritation/rash.).   LORazepam 2 MG tablet Commonly known as: ATIVAN TAKE 1-1&1/2 TABLETS BY MOUTH EVERY NIGHT AT BEDTIME AS NEEDED FOR SLEEP MAY TAKE 1 TABLET DURING DAY IF NEEDED   methocarbamol 500 MG tablet Commonly known as: Robaxin Take 1 tablet (500 mg total) by mouth every 6 (six) hours as needed for muscle spasms.   metoprolol succinate 25 MG 24 hr tablet Commonly known as: TOPROL-XL TAKE 1 TABLET(25 MG) BY MOUTH DAILY What changed:   how much to take  how to take this  when to take this  additional instructions   NONFORMULARY OR COMPOUNDED  ITEM [facility]:  Peripheral Neuropathy - Bupivacaine 1%, Doxepin 3%, Gabapentin 6%, Pentoxifylline 3%, Topiramate 1%. Apply 1-2 grams to affected area 3-4 times daily. What changed:   how much to take  how to take this  when to take this  reasons to take this   omeprazole 20 MG capsule Commonly known as: PRILOSEC TAKE 1 CAPSULE BY MOUTH EVERY DAY AS NEEDED What changed: See the new instructions.   pregabalin 75 MG capsule Commonly known as: Lyrica Take 1 capsule (75 mg total) by mouth 3 (three) times daily.   rivaroxaban 10 MG Tabs tablet Commonly known as: XARELTO Take 1 tablet (10 mg total) by mouth daily. Then take one 81 mg aspirin once a day for three weeks. Then discontinue aspirin.   traMADol 50 MG tablet Commonly known as: Ultram Take 1-2 tablets (50-100 mg total) by mouth every 6 (six) hours as needed for moderate pain.     ASK your doctor about these medications   HYDROmorphone 2 MG tablet Commonly known as: Dilaudid Take 1-2 tablets (2-4 mg total) by mouth every 6 (six) hours as needed for up to 5 days for severe pain. Ask about: Should I take this medication?            Discharge Care Instructions  (From admission, onward)         Start     Ordered   11/14/19 0000  Weight bearing as tolerated        11/14/19 0727   11/14/19 0000  Change dressing       Comments: You may remove the bulky bandage (ACE wrap and gauze) two days after surgery. You will have an adhesive waterproof bandage underneath. Leave this in place until your first follow-up appointment.   11/14/19 0109          Follow-up Information    Gaynelle Arabian, MD. Go on 11/28/2019.   Specialty: Orthopedic Surgery Why: You are scheduled for a post-operative appointment on 11-28-19 at 2:15 pm.  Contact information: [address] [place] [address] [place] [address] [phone]        [facility]. Go on 11/15/2019.   Why: You are scheduled for a physical therapy  appointment on  11-15-19 at 4:15 pm.               Signed: Griffith Citron, PA-C Orthopedic Surgery 11/20/2019, 2:19 PM

## 2019-11-21 ENCOUNTER — Other Ambulatory Visit: Payer: Self-pay

## 2019-11-22 ENCOUNTER — Encounter: Payer: Self-pay | Admitting: Physical Therapy

## 2019-11-22 ENCOUNTER — Other Ambulatory Visit: Payer: Self-pay

## 2019-11-22 ENCOUNTER — Ambulatory Visit: Payer: Medicare Other | Attending: Orthopedic Surgery | Admitting: Physical Therapy

## 2019-11-22 DIAGNOSIS — R600 Localized edema: Secondary | ICD-10-CM | POA: Insufficient documentation

## 2019-11-22 DIAGNOSIS — R262 Difficulty in walking, not elsewhere classified: Secondary | ICD-10-CM | POA: Diagnosis present

## 2019-11-22 DIAGNOSIS — M25561 Pain in right knee: Secondary | ICD-10-CM | POA: Insufficient documentation

## 2019-11-22 NOTE — Patient Instructions (Signed)
Access Code: [email or web address] URL: [email or web address] Date: 11/22/2019 Prepared by: Amador Cunas  Exercises Seated Knee Flexion Stretch - 1 x daily - 7 x weekly - 3 sets - 2 reps - 20 sec hold Seated Knee Extension Stretch with Chair - 1 x daily - 7 x weekly - 3 sets - 10 reps - 3 sec hold

## 2019-11-22 NOTE — Therapy (Signed)
[place], [place], [address] Phone: [phone]   Fax:  [phone]  Physical Therapy Evaluation  Patient Details  Name: Margaret [name] MRN: [id number] Date of Birth: 1940/11/06 Referring Provider (PT): Gaynelle Arabian   Encounter Date: 11/22/2019   PT End of Session - 11/22/19 1611    Visit Number 1    Date for PT Re-Evaluation 01/22/20    PT Start Time 0315    PT Stop Time 1619    PT Time Calculation (min) 49 min    Activity Tolerance Patient tolerated treatment well    Behavior During Therapy [facility] for tasks assessed/performed           Past Medical History:  Diagnosis Date  . Anxiety   . Arthritis   . Cancer (HCC)    lt. foot melanoma  . Chronic kidney disease    lt. kidney cyst  . History of healed stress fracture 2013   bilateral feet  . Hx of phlebitis    reports remote hx of "superfical blood clots" never anticoagulated  . Hypertension   . Osteopenia     Past Surgical History:  Procedure Laterality Date  . CHOLECYSTECTOMY N/A 09/19/2016   Procedure: LAPAROSCOPIC CHOLECYSTECTOMY POSSIBLE INTRAOPERATIVE CHOLANGIOGRAM;  Surgeon: Rolm Bookbinder, MD;  Location: [place];  Service: General;  Laterality: N/A;  . ERCP N/A 09/18/2016   Procedure: ENDOSCOPIC RETROGRADE CHOLANGIOPANCREATOGRAPHY (ERCP);  Surgeon: Doran Stabler, MD;  Location: [facility] END[facility]COPY;  Service: Endoscopy;  Laterality: N/A;  . FOOT SURGERY     patient reports four foot surgeries  . FOOT SURGERY Left    bunion, hammer toe surgery  . FOOT SURGERY Right    shaved bunion, hammer toe correction  . KNEE ARTHROSCOPY Right 1997  . REPLACEMENT TOTAL KNEE Left   . 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 RETINAL DETACHMENT SURGERY    . RETINAL DETACHMENT SURGERY Left 2008  . THYROIDECTOMY, PARTIAL Right   . THYROIDECTOMY, PARTIAL  2005  . TOTAL KNEE ARTHROPLASTY Right 11/13/2019   Procedure: TOTAL KNEE ARTHROPLASTY  SDDC;  Surgeon: Gaynelle Arabian, MD;  [facility]: [facility];  Service: Orthopedics;  Laterality: Right;  4min    There were no vitals filed for this visit.    Subjective Assessment - 11/22/19 1536    Subjective Pt R TKA 11/13/19. Pt reports she is doing pretty well overall but very sore today after trouble with stairs getting into a friend's house yesterday. Pt reports to clinic with RW; states that she was not using any AD prior to surgery    Limitations Sitting;Standing;Walking;House hold activities    Patient Stated Goals reduce pain, get back to normal    Currently in Pain? Yes    Pain Score 7     Pain Location Knee    Pain Orientation Right    Pain Descriptors / Indicators Aching;Burning    Pain Type Surgical pain    Pain Onset 1 to 4 weeks ago    Pain Frequency Intermittent    Aggravating Factors  standing, walking, weight bearing, bending    Pain Relieving Factors ice, pain meds              [facility] PT Assessment - 11/22/19 0001      Assessment   Medical Diagnosis R TKA    Referring Provider (PT) Pilar Plate Aluisio    Onset Date/Surgical Date 11/13/19    Next MD Visit 11/28/2019  Prior Therapy PT after L TKA      Precautions   Precautions None      Restrictions   Weight Bearing Restrictions No      Balance Screen   Has the patient fallen in the past 6 months No    Has the patient had a decrease in activity level because of a fear of falling?  No    Is the patient reluctant to leave their home because of a fear of falling?  No      Home Environment   Additional Comments no stairs at home      Prior Function   Level of Independence Independent    Vocation Requirements takes care of house; wants to be independent again      Observation/Other Assessments-Edema    Edema Circumferential      Circumferential Edema   Circumferential - Right 51 cm    Circumferential - Left  43 cm      ROM / Strength   AROM / PROM / Strength AROM;PROM      AROM   AROM Assessment Site  Knee    Right/Left Knee Right    Right Knee Extension 15    Right Knee Flexion 80      PROM   PROM Assessment Site Knee    Right/Left Knee Right    Right Knee Extension 12    Right Knee Flexion 85      Ambulation/Gait   Gait Comments gait with SBQC; cues for sequencing LE with AD. Ambulating with RW currently      Standardized Balance Assessment   Standardized Balance Assessment Timed Up and Go Test      Timed Up and Go Test   Normal TUG (seconds) 34    TUG Comments with RW                      Objective measurements completed on examination: See above findings.               PT Education - 11/22/19 1611    Education Details Pt educated on POC and HEP    Person(s) Educated Patient    Methods Explanation;Demonstration;Handout    Comprehension Verbalized understanding;Returned demonstration            PT Short Term Goals - 11/22/19 1723      PT SHORT TERM GOAL #1   Title Pt will be independent with HEP    Time 2    Status New    Target Date 12/06/19             PT Long Term Goals - 11/22/19 1723      PT LONG TERM GOAL #1   Title Pt will demonstrate knee extension of 0    Baseline 12    Time 8    Period Weeks    Status New    Target Date 01/17/20      PT LONG TERM GOAL #2   Title Pt will demonstrate knee flexion to 110    Baseline 85    Time 8    Period Weeks    Status New    Target Date 01/17/20      PT LONG TERM GOAL #3   Title Pt will decrease swelling on R to within 2 cm of L    Time 8    Period Weeks    Status New    Target Date 01/17/20  PT LONG TERM GOAL #4   Title Pt will ambulate >500 ft over level/unlevel surfaces with no AD    Time 8    Period Weeks    Status New    Target Date 01/17/20                  Plan - 11/22/19 1701    Clinical Impression Statement Pt reports to clinic s/p R TKA on 11/13/19. Pt demonstrates knee flexion to 85 deg and knee extension to 12 deg. Pt has 8 cm difference in  edema measurements from R to L. Pt ambulated into clinic with RW. Gait with [facility] with difficulty sequencing LE and AD; work on this in future rx.    Personal Factors and Comorbidities Comorbidity 2    Comorbidities HTN, osteopenia    Examination-Activity Limitations Lift;Locomotion Level;Transfers;Sleep;Sit;Stairs;Stand    Examination-Participation Restrictions Community Activity;Interpersonal Relationship    Stability/Clinical Decision Making Evolving/Moderate complexity    Clinical Decision Making Low    Rehab Potential Good    PT Frequency 2x / week    PT Duration 8 weeks    PT Treatment/Interventions ADLs/Self Care Home Management;Electrical Stimulation;Cryotherapy;Iontophoresis 4mg /ml Dexamethasone;Traction;Gait training;Stair training;Functional mobility training;Therapeutic activities;Therapeutic exercise;Balance training;Neuromuscular re-education;Patient/family education;Manual techniques;Scar mobilization;Passive range of motion;Vasopneumatic Device    PT Next Visit Plan push ROM/function, gait training with SPC or SBQC    Consulted and Agree with Plan of Care Patient           Patient will benefit from skilled therapeutic intervention in order to improve the following deficits and impairments:  Abnormal gait, Decreased range of motion, Difficulty walking, Pain, Hypomobility, Impaired flexibility, Decreased strength, Decreased mobility, Increased edema  Visit Diagnosis: Acute pain of right knee  Localized edema  Difficulty in walking, not elsewhere classified     Problem List Patient Active Problem List   Diagnosis Date Noted  . OA (osteoarthritis) of knee 11/13/2019  . Total knee replacement status, 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  . Encounter for postoperative examination after surgery for malignant neoplasm 09/30/2018  . 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 Plantar fasciitis of right foot 02/03/2017  . Porokeratosis 02/03/2017  . Acute pancreatitis due to calculus  of common bile duct 09/17/2016  . Acute gallstone pancreatitis   . Calculus of gallbladder with acute cholecystitis and obstruction   . LFT elevation   . Stress fracture 01/01/2016  . Insomnia 12/16/2015  . Peripheral neuropathy 10/18/2015  . Renal insufficiency 03/06/2015  . HTN (hypertension) 03/06/2015  . Osteopenia 03/06/2015  . GERD (gastroesophageal reflux disease) 03/06/2015  . Vaginal pessary present 03/06/2015   Amador Cunas, PT, DPT [facility] Basnett 11/22/2019, 5:29 PM  [place], [place], [address] Phone: [phone]   Fax:  [phone]  Name: Margaret [name] MRN: [id number] Date of Birth: 07-03-1940

## 2019-11-22 NOTE — Patient Outreach (Signed)
[place] [facility]) Care Management  11/22/2019  Margaret [name] 05/05/1941 [id number]   Red emmi: Date of call:  11/20/2019   Reason for alert:  Other questions- yes  11/21/2019 placed call to patient and explained reason for call.  Patient reports she does not have any questions but would like me to speak with her daughter, Margaret [name].  I spoke with Margaret [name] who reports she understands discharge papers but has questions about the delay in PT. Daughter reports appointment not until 11/29/2019 and she is concerned.  I suggested daughter call [facility] PT and inquire about a sooner appointment. Daughter states she called and could not get appointment moved up. I suggested daughter call MD office to inquire about appropriate time frame for PT.    Reviewed with daughter I would call back tomorrow to follow up.    11/22/2019  Placed call back to patient and she reports she was able to get an appointment for today 11/22/2019.  Denies any other concerns  PLAN:case close. No other needs identified.  Tomasa Rand, RN, BSN, CEN [facility] [employer] [phone]

## 2019-11-29 ENCOUNTER — Ambulatory Visit: Payer: Medicare Other | Admitting: Physical Therapy

## 2019-12-05 ENCOUNTER — Encounter: Payer: Self-pay | Admitting: Physical Therapy

## 2019-12-05 ENCOUNTER — Other Ambulatory Visit: Payer: Self-pay

## 2019-12-05 ENCOUNTER — Ambulatory Visit: Payer: Medicare Other | Attending: Orthopedic Surgery | Admitting: Physical Therapy

## 2019-12-05 DIAGNOSIS — M25561 Pain in right knee: Secondary | ICD-10-CM | POA: Diagnosis not present

## 2019-12-05 DIAGNOSIS — R600 Localized edema: Secondary | ICD-10-CM | POA: Insufficient documentation

## 2019-12-05 DIAGNOSIS — R262 Difficulty in walking, not elsewhere classified: Secondary | ICD-10-CM | POA: Diagnosis not present

## 2019-12-05 NOTE — Therapy (Signed)
[place], [place], [address] Phone: [phone]   Fax:  [phone]  Physical Therapy Treatment  Patient Details  Name: Margaret [name] MRN: [id number] Date of Birth: Oct 23, 1940 Referring Provider (PT): Gaynelle Arabian   Encounter Date: 12/05/2019   PT End of Session - 12/05/19 1404    Visit Number 2    Date for PT Re-Evaluation 01/22/20    PT Start Time 1318    PT Stop Time 1414    PT Time Calculation (min) 56 min    Activity Tolerance Patient tolerated treatment well    Behavior During Therapy [facility] for tasks assessed/performed           Past Medical History:  Diagnosis Date  . Anxiety   . Arthritis   . Cancer (HCC)    lt. foot melanoma  . Chronic kidney disease    lt. kidney cyst  . History of healed stress fracture 2013   bilateral feet  . Hx of phlebitis    reports remote hx of "superfical blood clots" never anticoagulated  . Hypertension   . Osteopenia     Past Surgical History:  Procedure Laterality Date  . CHOLECYSTECTOMY N/A 09/19/2016   Procedure: LAPAROSCOPIC CHOLECYSTECTOMY POSSIBLE INTRAOPERATIVE CHOLANGIOGRAM;  Surgeon: Rolm Bookbinder, MD;  Location: [place];  Service: General;  Laterality: N/A;  . ERCP N/A 09/18/2016   Procedure: ENDOSCOPIC RETROGRADE CHOLANGIOPANCREATOGRAPHY (ERCP);  Surgeon: Doran Stabler, MD;  Location: [facility] ENDOSCOPY;  Service: Endoscopy;  Laterality: N/A;  . FOOT SURGERY     patient reports four foot surgeries  . FOOT SURGERY Left    bunion, hammer toe surgery  . FOOT SURGERY Right    shaved bunion, hammer toe correction  . KNEE ARTHROSCOPY Right 1997  . REPLACEMENT TOTAL KNEE Left   . 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 RETINAL DETACHMENT SURGERY    . RETINAL DETACHMENT SURGERY Left 2008  . THYROIDECTOMY, PARTIAL Right   . THYROIDECTOMY, PARTIAL  2005  . TOTAL KNEE ARTHROPLASTY Right 11/13/2019   Procedure: TOTAL KNEE ARTHROPLASTY  SDDC;  Surgeon: Gaynelle Arabian, MD;  [facility];  Service: Orthopedics;  Laterality: Right;  36min    There were no vitals filed for this visit.   Subjective Assessment - 12/05/19 1329    Subjective Pt reports she is doing very well. Was told by MD to stop using RW; states she is now using cane for long distances and no AD in the house.    Currently in Pain? Yes    Pain Score 4     Pain Location Knee    Pain Orientation Right              [facility] PT Assessment - 12/05/19 0001      Circumferential Edema   Circumferential - Right 47 cm     Circumferential - Left  43 cm      AROM   AROM Assessment Site Knee    Right/Left Knee Right    Right Knee Extension 8    Right Knee Flexion 95                         [facility] Adult PT Treatment/Exercise - 12/05/19 0001      Exercises   Exercises Knee/Hip      Knee/Hip Exercises: Aerobic   Nustep L4 x 6 min LE only      Knee/Hip Exercises:  Machines for Strengthening   Cybex Knee Extension 5# BLE 2x10 with knee flexion stretch in between    Cybex Knee Flexion 15# BLE 2x10 with knee extension stretch between    Cybex Leg Press 20# BLE 2x10; no weight leg press down to bottom x5      Knee/Hip Exercises: Seated   Other Seated Knee/Hip Exercises knee flexion with green TB x 10     Other Seated Knee/Hip Exercises knee extension stretch with cues for quad activation 3sec x10      Modalities   Modalities Vasopneumatic      Vasopneumatic   Number Minutes Vasopneumatic  15 minutes    Vasopnuematic Location  Knee    Vasopneumatic Pressure Medium    Vasopneumatic Temperature  34      Manual Therapy   Manual Therapy Passive ROM    Passive ROM knee flexion/extension stretching                    PT Short Term Goals - 12/05/19 1406      PT SHORT TERM GOAL #1   Title Pt will be independent with HEP    Status Achieved             PT Long Term Goals - 12/05/19 1406      PT LONG TERM GOAL #1   Title  Pt will demonstrate knee extension of 0    Baseline 8    Status On-going      PT LONG TERM GOAL #2   Title Pt will demonstrate knee flexion to 110    Baseline 95    Status On-going      PT LONG TERM GOAL #3   Title Pt will decrease swelling on R to within 2 cm of L    Baseline L: 43 cm R: 47 cm    Status On-going      PT LONG TERM GOAL #4   Title Pt will ambulate >500 ft over level/unlevel surfaces with no AD    Status On-going                 Plan - 12/05/19 1404    Clinical Impression Statement Pt tolerated progression to TE well with no complaints of increased R knee pain with exercise. Progressed to machine interventions; cues required for full ROM. Pt demonstrates increased knee flexion/extension this rx as compared to eval. Pt is ambulating with SPC long distances and no AD around house. Gait with no AD cues for increased step length and knee flexion.    PT Treatment/Interventions ADLs/Self Care Home Management;Electrical Stimulation;Cryotherapy;Iontophoresis 4mg /ml Dexamethasone;Traction;Gait training;Stair training;Functional mobility training;Therapeutic activities;Therapeutic exercise;Balance training;Neuromuscular re-education;Patient/family education;Manual techniques;Scar mobilization;Passive range of motion;Vasopneumatic Device    PT Next Visit Plan push ROM/function, gait training with no AD    Consulted and Agree with Plan of Care Patient           Patient will benefit from skilled therapeutic intervention in order to improve the following deficits and impairments:  Abnormal gait, Decreased range of motion, Difficulty walking, Pain, Hypomobility, Impaired flexibility, Decreased strength, Decreased mobility, Increased edema  Visit Diagnosis: Acute pain of right knee  Localized edema  Difficulty in walking, not elsewhere classified     Problem List Patient Active Problem List   Diagnosis Date Noted  . OA (osteoarthritis) of knee 11/13/2019  . Total  knee replacement status, 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  . Encounter for postoperative examination after surgery for malignant neoplasm 09/30/2018  . Malignant melanoma of  left foot ([place]) 08/15/2018  . Plantar fasciitis of right foot 02/03/2017  . Porokeratosis 02/03/2017  . Acute pancreatitis due to calculus of common bile duct 09/17/2016  . Acute gallstone pancreatitis   . Calculus of gallbladder with acute cholecystitis and obstruction   . LFT elevation   . Stress fracture 01/01/2016  . Insomnia 12/16/2015  . Peripheral neuropathy 10/18/2015  . Renal insufficiency 03/06/2015  . HTN (hypertension) 03/06/2015  . Osteopenia 03/06/2015  . GERD (gastroesophageal reflux disease) 03/06/2015  . Vaginal pessary present 03/06/2015   Amador Cunas, PT, DPT [facility]se Keniya [place] 12/05/2019, 2:07 PM  [place], [place], [address] Phone: [phone]   Fax:  [phone]  Name: JERINE SURLES MRN: [id number] Date of Birth: Dec 29, 1940

## 2019-12-07 ENCOUNTER — Other Ambulatory Visit: Payer: Self-pay

## 2019-12-07 ENCOUNTER — Ambulatory Visit: Payer: Medicare Other | Admitting: Physical Therapy

## 2019-12-07 ENCOUNTER — Encounter: Payer: Self-pay | Admitting: Physical Therapy

## 2019-12-07 DIAGNOSIS — R262 Difficulty in walking, not elsewhere classified: Secondary | ICD-10-CM

## 2019-12-07 DIAGNOSIS — M25561 Pain in right knee: Secondary | ICD-10-CM

## 2019-12-07 DIAGNOSIS — R600 Localized edema: Secondary | ICD-10-CM | POA: Diagnosis not present

## 2019-12-07 NOTE — Therapy (Signed)
[place], [place], [address] Phone: [phone]   Fax:  [phone]  Physical Therapy Treatment  Patient Details  Name: Margaret [name] MRN: [id number] Date of Birth: [birth year] Referring Provider (PT): Gaynelle Arabian   Encounter Date: 12/07/2019   PT End of Session - 12/07/19 1338    Visit Number 3    Date for PT Re-Evaluation 01/22/20    PT Start Time 1300    PT Stop Time 1347    PT Time Calculation (min) 47 min    Activity Tolerance Patient tolerated treatment well           Past Medical History:  Diagnosis Date  . Anxiety   . Arthritis   . Cancer (HCC)    lt. foot melanoma  . Chronic kidney disease    lt. kidney cyst  . History of healed stress fracture 2013   bilateral feet  . Hx of phlebitis    reports remote hx of "superfical blood clots" never anticoagulated  . Hypertension   . Osteopenia     Past Surgical History:  Procedure Laterality Date  . CHOLECYSTECTOMY N/A 09/19/2016   Procedure: LAPAROSCOPIC CHOLECYSTECTOMY POSSIBLE INTRAOPERATIVE CHOLANGIOGRAM;  Surgeon: Rolm Bookbinder, MD;  Location: [place];  Service: General;  Laterality: N/A;  . ERCP N/A 09/18/2016   Procedure: ENDOSCOPIC RETROGRADE CHOLANGIOPANCREATOGRAPHY (ERCP);  Surgeon: Doran Stabler, MD;  Location: [facility] ENDOSCOPY;  Service: Endoscopy;  Laterality: N/A;  . FOOT SURGERY     patient reports four foot surgeries  . FOOT SURGERY Left    bunion, hammer toe surgery  . FOOT SURGERY Right    shaved bunion, hammer toe correction  . KNEE ARTHROSCOPY Right 1997  . REPLACEMENT TOTAL KNEE Left   . 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 RETINAL DETACHMENT SURGERY    . RETINAL DETACHMENT SURGERY Left 2008  . THYROIDECTOMY, PARTIAL Right   . THYROIDECTOMY, PARTIAL  2005  . TOTAL KNEE ARTHROPLASTY Right 11/13/2019   Procedure: TOTAL KNEE ARTHROPLASTY SDDC;  Surgeon: Gaynelle Arabian, MD;  [facility];   Service: Orthopedics;  Laterality: Right;  3min    There were no vitals filed for this visit.   Subjective Assessment - 12/07/19 1300    Subjective "I am in so much pain today" "Could be th weather"    Currently in Pain? Yes    Pain Score 7     Pain Location Knee    Pain Orientation Right                             [facility] Adult PT Treatment/Exercise - 12/07/19 0001      Knee/Hip Exercises: Aerobic   Nustep L4 x 6 min       Knee/Hip Exercises: Machines for Strengthening   Cybex Leg Press 20# BLE 2x10; no weight leg press down to bottom x5      Knee/Hip Exercises: Seated   Long Arc Quad Right;2 sets;10 reps    Long Arc Quad Weight 3 lbs.    Hamstring Curl Right;2 sets;10 reps    Hamstring Limitations red tband    Sit to Sand 1 set;10 reps;with UE support   UE assist with therapist     Modalities   Modalities Vasopneumatic      Vasopneumatic   Number Minutes Vasopneumatic  10 minutes    Vasopnuematic Location  Knee    Vasopneumatic Pressure  Medium    Vasopneumatic Temperature  34      Manual Therapy   Manual Therapy Passive ROM    Passive ROM knee flexion/extension stretching                    PT Short Term Goals - 12/05/19 1406      PT SHORT TERM GOAL #1   Title Pt will be independent with HEP    Status Achieved             PT Long Term Goals - 12/05/19 1406      PT LONG TERM GOAL #1   Title Pt will demonstrate knee extension of 0    Baseline 8    Status On-going      PT LONG TERM GOAL #2   Title Pt will demonstrate knee flexion to 110    Baseline 95    Status On-going      PT LONG TERM GOAL #3   Title Pt will decrease swelling on R to within 2 cm of L    Baseline L: 43 cm R: 47 cm    Status On-going      PT LONG TERM GOAL #4   Title Pt will ambulate >500 ft over level/unlevel surfaces with no AD    Status On-going                 Plan - 12/07/19 1339    Clinical Impression Statement Pt enters clinic  ambulating with SPC with decrease hell strike and toe off. She reports increase R knee pain so some interventions were voided compared to last session. She did well with the selected interventions. Cues for quad contraction with LAQ. Some pain with passive R knee extension.    Personal Factors and Comorbidities Comorbidity 2    Comorbidities HTN, osteopenia    Examination-Activity Limitations Lift;Locomotion Level;Transfers;Sleep;Sit;Stairs;Stand    Examination-Participation Restrictions Community Activity;Interpersonal Relationship    Stability/Clinical Decision Making Evolving/Moderate complexity    Rehab Potential Good    PT Frequency 2x / week    PT Treatment/Interventions ADLs/Self Care Home Management;Electrical Stimulation;Cryotherapy;Iontophoresis 4mg /ml Dexamethasone;Traction;Gait training;Stair training;Functional mobility training;Therapeutic activities;Therapeutic exercise;Balance training;Neuromuscular re-education;Patient/family education;Manual techniques;Scar mobilization;Passive range of motion;Vasopneumatic Device    PT Next Visit Plan push ROM/function, gait training with no AD           Patient will benefit from skilled therapeutic intervention in order to improve the following deficits and impairments:  Abnormal gait, Decreased range of motion, Difficulty walking, Pain, Hypomobility, Impaired flexibility, Decreased strength, Decreased mobility, Increased edema  Visit Diagnosis: Difficulty in walking, not elsewhere classified  Localized edema  Acute pain of right knee     Problem List Patient Active Problem List   Diagnosis Date Noted  . OA (osteoarthritis) of knee 11/13/2019  . Total knee replacement status, 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  . Encounter for postoperative examination after surgery for malignant neoplasm 09/30/2018  . Malignant melanoma of left foot ([place]) 08/15/2018  . Plantar fasciitis of right foot 02/03/2017  . Porokeratosis 02/03/2017  . Acute pancreatitis  due to calculus of common bile duct 09/17/2016  . Acute gallstone pancreatitis   . Calculus of gallbladder with acute cholecystitis and obstruction   . LFT elevation   . Stress fracture 01/01/2016  . Insomnia 12/16/2015  . Peripheral neuropathy 10/18/2015  . Renal insufficiency 03/06/2015  . HTN (hypertension) 03/06/2015  . Osteopenia 03/06/2015  . GERD (gastroesophageal reflux disease) 03/06/2015  . Vaginal pessary present 03/06/2015  Scot Jun 12/07/2019, 1:41 PM  [place], [place], [address] Phone: [phone]   Fax:  [phone]  Name: Margaret [name] MRN: [id number] Date of Birth: June 18, 1940

## 2019-12-11 ENCOUNTER — Telehealth: Payer: Self-pay | Admitting: Podiatry

## 2019-12-11 NOTE — Telephone Encounter (Signed)
Pt called stating she is going to run out of her gabapentin early because she is having to take 4 pills a day instead of 3. Pt wanting to know if the doctor can send in a refill to [facility] on [address] in [place].

## 2019-12-11 NOTE — Telephone Encounter (Signed)
I spoke with pt and asked the medication she wanted refilled for the neuropathy. Pt continued to say gabapentin and I asked that she get her bottle and we would check against her medication list. Pt read the prescription as pregabalin 75mg . I told pt I would inform Dr. [name] and to check her pharmacy later today and to follow the directions on the bottle. Pt states she had to leave vacation early due to the pain and has not slept in 2 nights.

## 2019-12-11 NOTE — Telephone Encounter (Signed)
I don't believe that she is taking gabapentin. We had previously ordered Lyrica. Can you please verify.

## 2019-12-12 ENCOUNTER — Other Ambulatory Visit: Payer: Self-pay

## 2019-12-12 ENCOUNTER — Ambulatory Visit: Payer: Medicare Other | Admitting: Physical Therapy

## 2019-12-12 ENCOUNTER — Other Ambulatory Visit: Payer: Self-pay | Admitting: Podiatry

## 2019-12-12 ENCOUNTER — Encounter: Payer: Self-pay | Admitting: Physical Therapy

## 2019-12-12 DIAGNOSIS — R262 Difficulty in walking, not elsewhere classified: Secondary | ICD-10-CM

## 2019-12-12 DIAGNOSIS — R600 Localized edema: Secondary | ICD-10-CM | POA: Diagnosis not present

## 2019-12-12 DIAGNOSIS — M25561 Pain in right knee: Secondary | ICD-10-CM

## 2019-12-12 MED ORDER — PREGABALIN 150 MG PO CAPS: 150.0000 mg | ORAL_CAPSULE | Freq: Two times a day (BID) | ORAL | 2 refills | Status: DC

## 2019-12-12 NOTE — Telephone Encounter (Signed)
I increased her dose to 150mg  twice a day. She should not take anymore than that.

## 2019-12-12 NOTE — Therapy (Signed)
[place], [place], [address] Phone: [phone]   Fax:  [phone]  Physical Therapy Treatment  Patient Details  Name: Margaret [name] MRN: [id number] Date of Birth: [birth year] Referring Provider (PT): Gaynelle Arabian   Encounter Date: 12/12/2019   PT End of Session - 12/12/19 1407    Visit Number 4    Date for PT Re-Evaluation 01/22/20    PT Start Time 1315    PT Stop Time 1400    PT Time Calculation (min) 45 min    Activity Tolerance Patient tolerated treatment well    Behavior During Therapy [facility] for tasks assessed/performed           Past Medical History:  Diagnosis Date  . Anxiety   . Arthritis   . Cancer (HCC)    lt. foot melanoma  . Chronic kidney disease    lt. kidney cyst  . History of healed stress fracture 2013   bilateral feet  . Hx of phlebitis    reports remote hx of "superfical blood clots" never anticoagulated  . Hypertension   . Osteopenia     Past Surgical History:  Procedure Laterality Date  . CHOLECYSTECTOMY N/A 09/19/2016   Procedure: LAPAROSCOPIC CHOLECYSTECTOMY POSSIBLE INTRAOPERATIVE CHOLANGIOGRAM;  Surgeon: Rolm Bookbinder, MD;  Location: [place];  Service: General;  Laterality: N/A;  . ERCP N/A 09/18/2016   Procedure: ENDOSCOPIC RETROGRADE CHOLANGIOPANCREATOGRAPHY (ERCP);  Surgeon: Doran Stabler, MD;  Location: [facility] ENDOSCOPY;  Service: Endoscopy;  Laterality: N/A;  . FOOT SURGERY     patient reports four foot surgeries  . FOOT SURGERY Left    bunion, hammer toe surgery  . FOOT SURGERY Right    shaved bunion, hammer toe correction  . KNEE ARTHROSCOPY Right 1997  . REPLACEMENT TOTAL KNEE Left   . 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 RETINAL DETACHMENT SURGERY    . RETINAL DETACHMENT SURGERY Left 2008  . THYROIDECTOMY, PARTIAL Right   . THYROIDECTOMY, PARTIAL  2005  . TOTAL KNEE ARTHROPLASTY Right 11/13/2019   Procedure: TOTAL KNEE ARTHROPLASTY  SDDC;  Surgeon: Gaynelle Arabian, MD;  Location: [facility];  Service: Orthopedics;  Laterality: Right;  76min    There were no vitals filed for this visit.   Subjective Assessment - 12/12/19 1317    Subjective Knee feeling good today    Currently in Pain? Yes    Pain Score 3     Pain Location Knee    Pain Orientation Right                             [facility] Adult PT Treatment/Exercise - 12/12/19 0001      Knee/Hip Exercises: Aerobic   Recumbent Bike x4 min full revolutions    Nustep L5 x 6 min      Knee/Hip Exercises: Machines for Strengthening   Cybex Knee Extension 5# 3x5    Cybex Knee Flexion 15# BLE 2x10    Cybex Leg Press 20# BLE 2x10; no weight leg press down to bottom x5      Knee/Hip Exercises: Standing   Heel Raises Both;1 set;10 reps      Manual Therapy   Manual Therapy Passive ROM    Passive ROM knee flexion/extension stretching                    PT Short Term Goals - 12/05/19 1406  PT SHORT TERM GOAL #1   Title Pt will be independent with HEP    Status Achieved             PT Long Term Goals - 12/05/19 1406      PT LONG TERM GOAL #1   Title Pt will demonstrate knee extension of 0    Baseline 8    Status On-going      PT LONG TERM GOAL #2   Title Pt will demonstrate knee flexion to 110    Baseline 95    Status On-going      PT LONG TERM GOAL #3   Title Pt will decrease swelling on R to within 2 cm of L    Baseline L: 43 cm R: 47 cm    Status On-going      PT LONG TERM GOAL #4   Title Pt will ambulate >500 ft over level/unlevel surfaces with no AD    Status On-going                 Plan - 12/12/19 1408    Clinical Impression Statement Pt demos gait with no AD around clinic this rx. Decreased heel strike and foot flat with gait; mildly antalgic gait and cues for knee flexion. No LOB. Tolerated manual tx well; pain with knee extension. Vaso in use today so pt will plan to ice at home.    PT  Treatment/Interventions ADLs/Self Care Home Management;Electrical Stimulation;Cryotherapy;Iontophoresis 4mg /ml Dexamethasone;Traction;Gait training;Stair training;Functional mobility training;Therapeutic activities;Therapeutic exercise;Balance training;Neuromuscular re-education;Patient/family education;Manual techniques;Scar mobilization;Passive range of motion;Vasopneumatic Device    PT Next Visit Plan push ROM/function, gait training with no AD    Consulted and Agree with Plan of Care Patient           Patient will benefit from skilled therapeutic intervention in order to improve the following deficits and impairments:  Abnormal gait, Decreased range of motion, Difficulty walking, Pain, Hypomobility, Impaired flexibility, Decreased strength, Decreased mobility, Increased edema  Visit Diagnosis: Difficulty in walking, not elsewhere classified  Localized edema  Acute pain of right knee     Problem List Patient Active Problem List   Diagnosis Date Noted  . OA (osteoarthritis) of knee 11/13/2019  . Total knee replacement status, 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  . Encounter for postoperative examination after surgery for malignant neoplasm 09/30/2018  . Malignant melanoma of left foot ([place]) 08/15/2018  . Plantar fasciitis of right foot 02/03/2017  . Porokeratosis 02/03/2017  . Acute pancreatitis due to calculus of common bile duct 09/17/2016  . Acute gallstone pancreatitis   . Calculus of gallbladder with acute cholecystitis and obstruction   . LFT elevation   . Stress fracture 01/01/2016  . Insomnia 12/16/2015  . Peripheral neuropathy 10/18/2015  . Renal insufficiency 03/06/2015  . HTN (hypertension) 03/06/2015  . Osteopenia 03/06/2015  . GERD (gastroesophageal reflux disease) 03/06/2015  . Vaginal pessary present 03/06/2015   Amador Cunas, PT, DPT [place] Kiylee Thoreson 12/12/2019, 2:10 PM  [place],  [place], [address] Phone: [phone]   Fax:  [phone]  Name: Margaret [name] MRN: [id number] Date of Birth: 10/13/1940

## 2019-12-12 NOTE — Telephone Encounter (Signed)
Left message with Dr. Leigh Aurora orders.

## 2019-12-12 NOTE — Telephone Encounter (Signed)
Left message with Lyrica orders at [place].

## 2019-12-14 ENCOUNTER — Ambulatory Visit: Payer: Medicare Other | Admitting: Physical Therapy

## 2019-12-14 ENCOUNTER — Encounter: Payer: Self-pay | Admitting: Physical Therapy

## 2019-12-14 ENCOUNTER — Other Ambulatory Visit: Payer: Self-pay

## 2019-12-14 DIAGNOSIS — R600 Localized edema: Secondary | ICD-10-CM

## 2019-12-14 DIAGNOSIS — M25561 Pain in right knee: Secondary | ICD-10-CM | POA: Diagnosis not present

## 2019-12-14 DIAGNOSIS — R262 Difficulty in walking, not elsewhere classified: Secondary | ICD-10-CM
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
## 2019-12-18 ENCOUNTER — Other Ambulatory Visit: Payer: Self-pay

## 2019-12-18 ENCOUNTER — Ambulatory Visit (INDEPENDENT_AMBULATORY_CARE_PROVIDER_SITE_OTHER): Payer: Medicare Other | Admitting: Podiatry

## 2019-12-18 DIAGNOSIS — G8929 Other chronic pain: Secondary | ICD-10-CM

## 2019-12-18 DIAGNOSIS — Q828 Other specified congenital malformations of skin: Secondary | ICD-10-CM

## 2019-12-18 DIAGNOSIS — G629 Polyneuropathy, unspecified: Secondary | ICD-10-CM

## 2019-12-18 DIAGNOSIS — M79673 Pain in unspecified foot: Secondary | ICD-10-CM | POA: Diagnosis not present

## 2019-12-19 ENCOUNTER — Encounter: Payer: Self-pay | Admitting: Physical Therapy

## 2019-12-19 ENCOUNTER — Ambulatory Visit: Payer: Medicare Other | Admitting: Physical Therapy

## 2019-12-19 DIAGNOSIS — M25561 Pain in right knee: Secondary | ICD-10-CM

## 2019-12-19 DIAGNOSIS — R600 Localized edema: Secondary | ICD-10-CM | POA: Diagnosis not present

## 2019-12-19 DIAGNOSIS — R262 Difficulty in walking, not elsewhere classified: Secondary | ICD-10-CM | POA: Diagnosis not present

## 2019-12-19 NOTE — Therapy (Signed)
[place], [place], [address] Phone: [phone]   Fax:  [phone]  Physical Therapy Treatment  Patient Details  Name: Margaret [name] MRN: [id number] Date of Birth: [birth year] Referring Provider (PT): Gaynelle Arabian   Encounter Date: 12/19/2019   PT End of Session - 12/19/19 1340    Visit Number 6    Date for PT Re-Evaluation 01/22/20    PT Start Time 1300    PT Stop Time 1350    PT Time Calculation (min) 50 min           Past Medical History:  Diagnosis Date  . Anxiety   . Arthritis   . Cancer (HCC)    lt. foot melanoma  . Chronic kidney disease    lt. kidney cyst  . History of healed stress fracture 2013   bilateral feet  . Hx of phlebitis    reports remote hx of "superfical blood clots" never anticoagulated  . Hypertension   . Osteopenia     Past Surgical History:  Procedure Laterality Date  . CHOLECYSTECTOMY N/A 09/19/2016   Procedure: LAPAROSCOPIC CHOLECYSTECTOMY POSSIBLE INTRAOPERATIVE CHOLANGIOGRAM;  Surgeon: Rolm Bookbinder, MD;  Location: [facility];  Service: General;  Laterality: N/A;  . ERCP N/A 09/18/2016   Procedure: ENDOSCOPIC RETROGRADE CHOLANGIOPANCREATOGRAPHY (ERCP);  Surgeon: Doran Stabler, MD;  Location: [facility] ENDOSCOPY;  Service: Endoscopy;  Laterality: N/A;  . FOOT SURGERY     patient reports four foot surgeries  . FOOT SURGERY Left    bunion, hammer toe surgery  . FOOT SURGERY Right    shaved bunion, hammer toe correction  . KNEE ARTHROSCOPY Right 1997  . REPLACEMENT TOTAL KNEE Left   . 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 RETINAL DETACHMENT SURGERY    . RETINAL DETACHMENT SURGERY Left 2008  . THYROIDECTOMY, PARTIAL Right   . THYROIDECTOMY, PARTIAL  2005  . TOTAL KNEE ARTHROPLASTY Right 11/13/2019   Procedure: TOTAL KNEE ARTHROPLASTY SDDC;  Surgeon: Gaynelle Arabian, MD;  Location: [facility];  Service: Orthopedics;  Laterality: Right;  46mn    There  were no vitals filed for this visit.   Subjective Assessment - 12/19/19 1304    Subjective "Sore my knee hurts today" Been hurting since last session    Currently in Pain? Yes    Pain Location Knee    Pain Descriptors / Indicators Tender              [facility] PT Assessment - 12/19/19 0001      AROM   Right Knee Extension 7    Right Knee Flexion 105                         [facility] Adult PT Treatment/Exercise - 12/19/19 0001      Knee/Hip Exercises: Aerobic   Recumbent Bike x4 min full revolutions    Nustep L4 x 6 min      Knee/Hip Exercises: Machines for Strengthening   Cybex Knee Extension 5# 2x10    Cybex Knee Flexion 20lb 2x10, RLE 15lb 2x5      Knee/Hip Exercises: Standing   Forward Step Up Right;2 sets;5 reps;10 reps;Hand Hold: 0;Step Height: 4";Step Height: 6"    Walking with Sports Cord 20lb forward & backward x3 eah      Vasopneumatic   Number Minutes Vasopneumatic  10 minutes    Vasopnuematic Location  Knee    Vasopneumatic Pressure Medium  Vasopneumatic Temperature  34      Manual Therapy   Manual Therapy Passive ROM    Passive ROM knee flexion/extension stretching                    PT Short Term Goals - 12/05/19 1406      PT SHORT TERM GOAL #1   Title Pt will be independent with HEP    Status Achieved             PT Long Term Goals - 12/19/19 1339      PT LONG TERM GOAL #1   Title Pt will demonstrate knee extension of 0    Status On-going      PT LONG TERM GOAL #2   Title Pt will demonstrate knee flexion to 110    Baseline 105    Status Partially Met      PT LONG TERM GOAL #3   Title Pt will decrease swelling on R to within 2 cm of L    Status On-going      PT LONG TERM GOAL #4   Title Pt will ambulate >500 ft over level/unlevel surfaces with no AD    Status On-going                 Plan - 12/19/19 1340    Clinical Impression Statement Pt has progressed increasing her R knee AROM. Despite the  increase in ROM she is still lacking a few degrees of extension. Progressed to some step up with little compensation only needing HHA x1. Cues to control the eccentric phase of resisted gait. She tolerated some SL RLE strengthening today without issue.    Comorbidities HTN, osteopenia    Examination-Activity Limitations Lift;Locomotion Level;Transfers;Sleep;Sit;Stairs;Stand    Examination-Participation Restrictions Community Activity;Interpersonal Relationship    Stability/Clinical Decision Making Evolving/Moderate complexity    Rehab Potential Good    PT Frequency 2x / week    PT Duration 8 weeks    PT Treatment/Interventions ADLs/Self Care Home Management;Electrical Stimulation;Cryotherapy;Iontophoresis 79m/ml Dexamethasone;Traction;Gait training;Stair training;Functional mobility training;Therapeutic activities;Therapeutic exercise;Balance training;Neuromuscular re-education;Patient/family education;Manual techniques;Scar mobilization;Passive range of motion;Vasopneumatic Device    PT Next Visit Plan push ROM/function, gait training with no AD           Patient will benefit from skilled therapeutic intervention in order to improve the following deficits and impairments:  Abnormal gait, Decreased range of motion, Difficulty walking, Pain, Hypomobility, Impaired flexibility, Decreased strength, Decreased mobility, Increased edema  Visit Diagnosis: Localized edema  Difficulty in walking, not elsewhere classified  Acute pain of right knee     Problem List Patient Active Problem List   Diagnosis Date Noted  . OA (osteoarthritis) of knee 11/13/2019  . Total knee replacement status, 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  . Encounter for postoperative examination after surgery for malignant neoplasm 09/30/2018  . Malignant [place] 08/15/2018  . Plantar fasciitis of right foot 02/03/2017  . Porokeratosis 02/03/2017  . Acute pancreatitis due to calculus of common bile duct 09/17/2016  .  Acute gallstone pancreatitis   . Calculus of gallbladder with acute cholecystitis and obstruction   . LFT elevation   . Stress fracture 01/01/2016  . Insomnia 12/16/2015  . Peripheral neuropathy 10/18/2015  . Renal insufficiency 03/06/2015  . HTN (hypertension) 03/06/2015  . Osteopenia 03/06/2015  . GERD (gastroesophageal reflux disease) 03/06/2015  . Vaginal pessary present 03/06/2015    RScot Jun PTA 12/19/2019, 1:42 PM  [facility]  [place], [place], [address] Phone: [phone]   Fax:  [phone]  Name: Margaret [name] MRN: [id number] Date of Birth: 1941-05-30

## 2019-12-21 ENCOUNTER — Encounter: Payer: Self-pay | Admitting: Physical Therapy

## 2019-12-21 ENCOUNTER — Ambulatory Visit: Payer: Medicare Other | Admitting: Physical Therapy

## 2019-12-21 ENCOUNTER — Other Ambulatory Visit: Payer: Self-pay

## 2019-12-21 DIAGNOSIS — M25561 Pain in right knee: Secondary | ICD-10-CM

## 2019-12-21 DIAGNOSIS — R262 Difficulty in walking, not elsewhere classified: Secondary | ICD-10-CM | POA: Diagnosis not present

## 2019-12-21 DIAGNOSIS — R600 Localized edema: Secondary | ICD-10-CM

## 2019-12-21 NOTE — Therapy (Signed)
[place], [place], [address] Phone: [phone]   Fax:  [phone]  Physical Therapy Treatment  Patient Details  Name: Margaret [name] MRN: [id number] Date of Birth: 1940/10/19 Referring Provider (PT): Margaret [name]   Encounter Date: 12/21/2019   PT End of Session - 12/21/19 1340    Visit Number 7    Date for PT Re-Evaluation 01/22/20    PT Start Time 1300    PT Stop Time 1350    PT Time Calculation (min) 50 min    Activity Tolerance Patient tolerated treatment well    Behavior During Therapy [facility] for tasks assessed/performed           Past Medical History:  Diagnosis Date  . Anxiety   . Arthritis   . Cancer (HCC)    lt. foot melanoma  . Chronic kidney disease    lt. kidney cyst  . History of healed stress fracture 2013   bilateral feet  . Hx of phlebitis    reports remote hx of "superfical blood clots" never anticoagulated  . Hypertension   . Osteopenia     Past Surgical History:  Procedure Laterality Date  . CHOLECYSTECTOMY N/A 09/19/2016   Procedure: LAPAROSCOPIC CHOLECYSTECTOMY POSSIBLE INTRAOPERATIVE CHOLANGIOGRAM;  Surgeon: Margaret Bookbinder, MD;  Location: [place];  Service: General;  Laterality: N/A;  . ERCP N/A 09/18/2016   Procedure: ENDOSCOPIC RETROGRADE CHOLANGIOPANCREATOGRAPHY (ERCP);  Surgeon: Margaret Stabler, MD;  Location: [facility] ENDOSCOPY;  Service: Endoscopy;  Laterality: N/A;  . FOOT SURGERY     patient reports four foot surgeries  . FOOT SURGERY Left    bunion, hammer toe surgery  . FOOT SURGERY Right    shaved bunion, hammer toe correction  . KNEE ARTHROSCOPY Right 1997  . REPLACEMENT TOTAL KNEE Left   . 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 RETINAL DETACHMENT SURGERY    . RETINAL DETACHMENT SURGERY Left 2008  . THYROIDECTOMY, PARTIAL Right   . THYROIDECTOMY, PARTIAL  2005  . TOTAL KNEE ARTHROPLASTY Right 11/13/2019   Procedure: TOTAL KNEE ARTHROPLASTY  SDDC;  Surgeon: Margaret Arabian, MD;  Location: [facility];  Service: Orthopedics;  Laterality: Right;  27mn    There were no vitals filed for this visit.   Subjective Assessment - 12/21/19 1305    Subjective I did not see the MD they called and cancel my apt. Feeling good today, no pain    Currently in Pain? No/denies                             [facility] - 12/21/19 0001      Knee/Hip Exercises: Aerobic   Recumbent Bike x4 min full revolutions    Nustep L4 x 6 min      Knee/Hip Exercises: Machines for Strengthening   Cybex Knee Extension 10lb 2x10    Cybex Knee Flexion 20lb 2x10, RLE 15lb 2x5    Cybex Leg Press 20# BLE 2x10      Knee/Hip Exercises: Standing   Forward Step Up Right;2 sets;5 reps;10 reps;Hand Hold: 0;Step Height: 4";Step Height: 6"    Walking with Sports Cord 20lb side steps x3 each       Vasopneumatic   Number Minutes Vasopneumatic  10 minutes    Vasopnuematic Location  Knee    Vasopneumatic Pressure Medium    Vasopneumatic Temperature  34      Manual  Therapy   Manual Therapy Passive ROM    Passive ROM knee flexion/extension stretching                    PT Short Term Goals - 12/05/19 1406      PT SHORT TERM GOAL #1   Title Pt will be independent with HEP    Status Achieved             PT Long Term Goals - 12/19/19 1339      PT LONG TERM GOAL #1   Title Pt will demonstrate knee extension of 0    Status On-going      PT LONG TERM GOAL #2   Title Pt will demonstrate knee flexion to 110    Baseline 105    Status Partially Met      PT LONG TERM GOAL #3   Title Pt will decrease swelling on R to within 2 cm of L    Status On-going      PT LONG TERM GOAL #4   Title Pt will ambulate >500 ft over level/unlevel surfaces with no AD    Status On-going                 Plan - 12/21/19 1341    Clinical Impression Statement Pt did well overall today. No issues with today's interventions.  Some weakness present with side steps noted going to her R. Increase resistance tolerated with leg extensions. HHA x1 needed with step ups for security. MT focused more on R knee Ext with some pain    Personal Factors and Comorbidities Comorbidity 2    Comorbidities HTN, osteopenia    Examination-Activity Limitations Lift;Locomotion Level;Transfers;Sleep;Sit;Stairs;Stand    Examination-Participation Restrictions Community Activity;Interpersonal Relationship    Stability/Clinical Decision Making Evolving/Moderate complexity    Rehab Potential Good    PT Frequency 2x / week    PT Treatment/Interventions ADLs/Self Care Home Management;Electrical Stimulation;Cryotherapy;Iontophoresis 69m/ml Dexamethasone;Traction;Gait training;Stair training;Functional mobility training;Therapeutic activities;Therapeutic exercise;Balance training;Neuromuscular re-education;Patient/family education;Manual techniques;Scar mobilization;Passive range of motion;Vasopneumatic Device    PT Next Visit Plan push ROM/function, gait training with no AD           Patient will benefit from skilled therapeutic intervention in order to improve the following deficits and impairments:  Abnormal gait, Decreased range of motion, Difficulty walking, Pain, Hypomobility, Impaired flexibility, Decreased strength, Decreased mobility, Increased edema  Visit Diagnosis: Acute pain of right knee  Difficulty in walking, not elsewhere classified  Localized edema     Problem List Patient Active Problem List   Diagnosis Date Noted  . OA (osteoarthritis) of knee 11/13/2019  . Total knee replacement status, 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  . Encounter for postoperative examination after surgery for malignant neoplasm 09/30/2018  . Malignant melanoma of left foot ([facility] 08/15/2018  . Plantar fasciitis of right foot 02/03/2017  . Porokeratosis 02/03/2017  . Acute pancreatitis due to calculus of common bile duct 09/17/2016  . Acute gallstone  pancreatitis   . Calculus of gallbladder with acute cholecystitis and obstruction   . LFT elevation   . Stress fracture 01/01/2016  . Insomnia 12/16/2015  . Peripheral neuropathy 10/18/2015  . Renal insufficiency 03/06/2015  . HTN (hypertension) 03/06/2015  . Osteopenia 03/06/2015  . GERD (gastroesophageal reflux disease) 03/06/2015  . Vaginal pessary present 03/06/2015    RScot Jun PTA 12/21/2019, 1:44 PM  [place] [address]Phone: [phone]  Fax:  [phone]  Name: Margaret [name] MRN: [id number] Date of Birth: Nov 08, 1940

## 2019-12-24 NOTE — Progress Notes (Signed)
Subjective: 79 year old female presents the office today due to painful calluses that she would have trimmed.  Increase Lyrica 150 twice a day she is tolerating this well.  Still some of the neuropathy symptoms but this seems to be doing better for her.  She presents today for callus treatment as well.  Denies any open sores.  No increase in swelling or redness to the feet. Denies any systemic complaints such as fevers, chills, nausea, vomiting. No acute changes since last appointment, and no other complaints at this time.   Objective: AAO x3, NAD Hyperkeratotic lesions present left foot metatarsal area as well as fifth metatarsal base.  Also present on the right foot submetatarsal x1.  There is no ongoing ulceration drainage or any signs of infection. Multiple digital deformities are present which are unchanged.  Dorsal midfoot arthritic changes noted. No pain with calf compression, swelling, warmth, erythema  Assessment: Symptomatic callus formation bilaterally, neuropathy  Plan: -All treatment options discussed with the patient including all alternatives, risks, complications.  -Debrided hyperkeratotic lesions x3 without any complications or bleeding -Continue Lyrica as prescribed.  -Patient encouraged to call the office with any questions, concerns, change in symptoms.   Trula Slade DPM

## 2019-12-26 ENCOUNTER — Other Ambulatory Visit: Payer: Self-pay

## 2019-12-26 ENCOUNTER — Ambulatory Visit: Payer: Medicare Other | Admitting: Physical Therapy

## 2019-12-26 DIAGNOSIS — M25561 Pain in right knee: Secondary | ICD-10-CM | POA: Diagnosis not present

## 2019-12-26 DIAGNOSIS — R600 Localized edema: Secondary | ICD-10-CM

## 2019-12-26 DIAGNOSIS — Z96651 Presence of right artificial knee joint: Secondary | ICD-10-CM | POA: Diagnosis not present

## 2019-12-26 DIAGNOSIS — R262 Difficulty in walking, not elsewhere classified: Secondary | ICD-10-CM | POA: Diagnosis not present

## 2019-12-26 DIAGNOSIS — Z471 Aftercare following joint replacement surgery: Secondary | ICD-10-CM | POA: Diagnosis not present

## 2019-12-26 DIAGNOSIS — Z96659 Presence of unspecified artificial knee joint: Secondary | ICD-10-CM | POA: Diagnosis not present

## 2019-12-26 NOTE — Therapy (Signed)
[place], [place], [address] Phone: [phone]   Fax:  [phone]  Physical Therapy Treatment  Patient Details  Name: Margaret [name] MRN: [id number] Date of Birth: [birth year] Referring Provider (PT): Gaynelle Arabian   Encounter Date: 12/26/2019   PT End of Session - 12/26/19 1341    Visit Number 8    Date for PT Re-Evaluation 01/22/20    PT Start Time 5170    PT Stop Time 1350    PT Time Calculation (min) 45 min           Past Medical History:  Diagnosis Date  . Anxiety   . Arthritis   . Cancer (HCC)    lt. foot melanoma  . Chronic kidney disease    lt. kidney cyst  . History of healed stress fracture 2013   bilateral feet  . Hx of phlebitis    reports remote hx of "superfical blood clots" never anticoagulated  . Hypertension   . Osteopenia     Past Surgical History:  Procedure Laterality Date  . CHOLECYSTECTOMY N/A 09/19/2016   Procedure: LAPAROSCOPIC CHOLECYSTECTOMY POSSIBLE INTRAOPERATIVE CHOLANGIOGRAM;  Surgeon: Rolm Bookbinder, MD;  Location: [place];  Service: General;  Laterality: N/A;  . ERCP N/A 09/18/2016   Procedure: ENDOSCOPIC RETROGRADE CHOLANGIOPANCREATOGRAPHY (ERCP);  Surgeon: Doran Stabler, MD;  Location: [facility] ENDOSCOPY;  Service: Endoscopy;  Laterality: N/A;  . FOOT SURGERY     patient reports four foot surgeries  . FOOT SURGERY Left    bunion, hammer toe surgery  . FOOT SURGERY Right    shaved bunion, hammer toe correction  . KNEE ARTHROSCOPY Right 1997  . REPLACEMENT TOTAL KNEE Left   . 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 RETINAL DETACHMENT SURGERY    . RETINAL DETACHMENT SURGERY Left 2008  . THYROIDECTOMY, PARTIAL Right   . THYROIDECTOMY, PARTIAL  2005  . TOTAL KNEE ARTHROPLASTY Right 11/13/2019   Procedure: TOTAL KNEE ARTHROPLASTY SDDC;  Surgeon: Gaynelle Arabian, MD;  Location: [facility];  Service: Orthopedics;  Laterality: Right;  4min    There  were no vitals filed for this visit.   Subjective Assessment - 12/26/19 1306    Subjective Patient is feeling well today with no pain. She is seeing the doctor and is hoping this is the last visit.    Currently in Pain? No/denies                             [facility] Adult PT Treatment/Exercise - 12/26/19 0001      Knee/Hip Exercises: Aerobic   Nustep L5 x 6 min      Knee/Hip Exercises: Machines for Strengthening   Cybex Knee Extension 10# x10, RLE 2x10 5#    Cybex Knee Flexion 20lb x10, RLE 15lb 2x10      Knee/Hip Exercises: Standing   Terminal Knee Extension Strengthening;Right;2 sets;15 reps;Theraband    Theraband Level (Terminal Knee Extension) Level 2 (Red)    Lateral Step Up --    Lateral Step Up Limitations --      Knee/Hip Exercises: Seated   Sit to Sand --      Vasopneumatic   Number Minutes Vasopneumatic  10 minutes    Vasopnuematic Location  Knee    Vasopneumatic Pressure Medium    Vasopneumatic Temperature  34      Manual Therapy   Manual Therapy Passive ROM  Passive ROM knee flexion/extension stretching                    PT Short Term Goals - 12/05/19 1406      PT SHORT TERM GOAL #1   Title Pt will be independent with HEP    Status Achieved             PT Long Term Goals - 12/26/19 1307      PT LONG TERM GOAL #1   Title Pt will demonstrate knee extension of 0    Baseline 7    Status On-going      PT LONG TERM GOAL #2   Title Pt will demonstrate knee flexion to 110    Baseline 108    Status On-going      PT LONG TERM GOAL #4   Title Pt will ambulate >500 ft over level/unlevel surfaces with no AD    Status Achieved                 Plan - 12/26/19 1341    Clinical Impression Statement Patient tolerated progression of TE well showing increase of quad/hamstring strength. Patient progressing in knee flexion ROM, however, still lacking 7 degrees of extension and some swelling present. No c/o of increasing pain  with any movements.    Personal Factors and Comorbidities Comorbidity 2    Comorbidities HTN, osteopenia    Examination-Activity Limitations Lift;Locomotion Level;Transfers;Sleep;Sit;Stairs;Stand    Examination-Participation Restrictions Community Activity;Interpersonal Relationship    Rehab Potential Good    PT Frequency 2x / week    PT Duration 8 weeks    PT Treatment/Interventions ADLs/Self Care Home Management;Electrical Stimulation;Cryotherapy;Iontophoresis 4mg /ml Dexamethasone;Traction;Gait training;Stair training;Functional mobility training;Therapeutic activities;Therapeutic exercise;Balance training;Neuromuscular re-education;Patient/family education;Manual techniques;Scar mobilization;Passive range of motion;Vasopneumatic Device    PT Next Visit Plan Get report from MD    Consulted and Agree with Plan of Care Patient           Patient will benefit from skilled therapeutic intervention in order to improve the following deficits and impairments:  Abnormal gait, Decreased range of motion, Difficulty walking, Pain, Hypomobility, Impaired flexibility, Decreased strength, Decreased mobility, Increased edema  Visit Diagnosis: Acute pain of right knee  Difficulty in walking, not elsewhere classified  Localized edema     Problem List Patient Active Problem List   Diagnosis Date Noted  . OA (osteoarthritis) of knee 11/13/2019  . Total knee replacement status, 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  . Encounter for postoperative examination after surgery for malignant neoplasm 09/30/2018  . 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 Plantar fasciitis of right foot 02/03/2017  . Porokeratosis 02/03/2017  . Acute pancreatitis due to calculus of common bile duct 09/17/2016  . Acute gallstone pancreatitis   . Calculus of gallbladder with acute cholecystitis and obstruction   . LFT elevation   . Stress fracture 01/01/2016  . Insomnia 12/16/2015  . Peripheral neuropathy 10/18/2015  . Renal  insufficiency 03/06/2015  . HTN (hypertension) 03/06/2015  . Osteopenia 03/06/2015  . GERD (gastroesophageal reflux disease) 03/06/2015  . Vaginal pessary present 03/06/2015    York Cerise, SPTA 12/26/2019, 1:46 PM  [place], [place], [address] Phone: [phone]   Fax:  [phone]  Name: DARSHA ZUMSTEIN MRN: [id number] Date of Birth: [birth year]

## 2019-12-28 ENCOUNTER — Encounter: Payer: Self-pay | Admitting: Physical Therapy

## 2019-12-28 ENCOUNTER — Ambulatory Visit: Payer: Medicare Other | Admitting: Physical Therapy

## 2019-12-28 ENCOUNTER — Other Ambulatory Visit: Payer: Self-pay

## 2019-12-28 DIAGNOSIS — M25561 Pain in right knee: Secondary | ICD-10-CM

## 2019-12-28 DIAGNOSIS — R262 Difficulty in walking, not elsewhere classified: Secondary | ICD-10-CM

## 2019-12-28 DIAGNOSIS — R600 Localized edema: Secondary | ICD-10-CM | POA: Diagnosis not present

## 2019-12-28 NOTE — Therapy (Signed)
[place], [place], [address] Phone: [phone]   Fax:  [phone]  Physical Therapy Treatment  Patient Details  Name: Margaret [name] MRN: [id number] Date of Birth: 1940/06/16 Referring Provider (PT): Gaynelle Arabian   Encounter Date: 12/28/2019   PT End of Session - 12/28/19 1321    Visit Number 9    Date for PT Re-Evaluation 01/22/20    PT Start Time 1300    PT Stop Time 1321    PT Time Calculation (min) 21 min    Activity Tolerance Patient tolerated treatment well    Behavior During Therapy [facility] for tasks assessed/performed           Past Medical History:  Diagnosis Date  . Anxiety   . Arthritis   . Cancer (HCC)    lt. foot melanoma  . Chronic kidney disease    lt. kidney cyst  . History of healed stress fracture 2013   bilateral feet  . Hx of phlebitis    reports remote hx of "superfical blood clots" never anticoagulated  . Hypertension   . Osteopenia     Past Surgical History:  Procedure Laterality Date  . CHOLECYSTECTOMY N/A 09/19/2016   Procedure: LAPAROSCOPIC CHOLECYSTECTOMY POSSIBLE INTRAOPERATIVE CHOLANGIOGRAM;  Surgeon: Rolm Bookbinder, MD;  Location: [place];  Service: General;  Laterality: N/A;  . ERCP N/A 09/18/2016   Procedure: ENDOSCOPIC RETROGRADE CHOLANGIOPANCREATOGRAPHY (ERCP);  Surgeon: Doran Stabler, MD;  Location: [facility] ENDOSCOPY;  Service: Endoscopy;  Laterality: N/A;  . FOOT SURGERY     patient reports four foot surgeries  . FOOT SURGERY Left    bunion, hammer toe surgery  . FOOT SURGERY Right    shaved bunion, hammer toe correction  . KNEE ARTHROSCOPY Right 1997  . REPLACEMENT TOTAL KNEE Left   . 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 RETINAL DETACHMENT SURGERY    . RETINAL DETACHMENT SURGERY Left 2008  . THYROIDECTOMY, PARTIAL Right   . THYROIDECTOMY, PARTIAL  2005  . TOTAL KNEE ARTHROPLASTY Right 11/13/2019   Procedure: TOTAL KNEE ARTHROPLASTY  SDDC;  Surgeon: Gaynelle Arabian, MD;  Location: [facility];  Service: Orthopedics;  Laterality: Right;  28mn    There were no vitals filed for this visit.   Subjective Assessment - 12/28/19 1302    Subjective "Today is my last day" "The MD was pleased"    Currently in Pain? No/denies                             [facility] 0001      High Level Balance   High Level Balance Activities Side stepping;Backward walking    High Level Balance Comments standing marches      Knee/Hip Exercises: Aerobic   Nustep L4 x 6 min      Knee/Hip Exercises: Machines for Strengthening   Cybex Knee Extension 5lb 2x10    Cybex Knee Flexion 20lb 2x10, RLE 15lb 2x5      Knee/Hip Exercises: Standing   Forward Step Up Right;1 set;10 reps;Hand Hold: 1;Step Height: 6"    Other Standing Knee Exercises standing marches at sink 2x10                    PT Short Term Goals - 12/05/19 1406      PT SHORT TERM GOAL #1   Title Pt will be independent with  HEP    Status Achieved             PT Long Term Goals - 12/26/19 1307      PT LONG TERM GOAL #1   Title Pt will demonstrate knee extension of 0    Baseline 7    Status On-going      PT LONG TERM GOAL #2   Title Pt will demonstrate knee flexion to 110    Baseline 108    Status On-going      PT LONG TERM GOAL #4   Title Pt will ambulate >500 ft over level/unlevel surfaces with no AD    Status Achieved                 Plan - 12/28/19 1321    Clinical Impression Statement Pt enters clinic reporting that today was her last day per her reports form MD. She still has some RLE weakness present with step ups and extensions. Despite weakness she reports being pleased with her current functional status and would like to be discharged.    Personal Factors and Comorbidities Comorbidity 2    Comorbidities HTN, osteopenia    Examination-Activity Limitations Lift;Locomotion  Level;Transfers;Sleep;Sit;Stairs;Stand    Examination-Participation Restrictions Community Activity;Interpersonal Relationship    Stability/Clinical Decision Making Evolving/Moderate complexity    Rehab Potential Good    PT Frequency 2x / week    PT Duration 8 weeks    PT Treatment/Interventions ADLs/Self Care Home Management;Electrical Stimulation;Cryotherapy;Iontophoresis 11m/ml Dexamethasone;Traction;Gait training;Stair training;Functional mobility training;Therapeutic activities;Therapeutic exercise;Balance training;Neuromuscular re-education;Patient/family education;Manual techniques;Scar mobilization;Passive range of motion;Vasopneumatic Device    PT Next Visit Plan D/C PT           Patient will benefit from skilled therapeutic intervention in order to improve the following deficits and impairments:  Abnormal gait, Decreased range of motion, Difficulty walking, Pain, Hypomobility, Impaired flexibility, Decreased strength, Decreased mobility, Increased edema  Visit Diagnosis: Difficulty in walking, not elsewhere classified  Localized edema  Acute pain of right knee     Problem List Patient Active Problem List   Diagnosis Date Noted  . OA (osteoarthritis) of knee 11/13/2019  . Total knee replacement status, 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  . Encounter for postoperative examination after surgery for malignant neoplasm 09/30/2018  . Malignant [place] 08/15/2018  . Plantar fasciitis of right foot 02/03/2017  . Porokeratosis 02/03/2017  . Acute pancreatitis due to calculus of common bile duct 09/17/2016  . Acute gallstone pancreatitis   . Calculus of gallbladder with acute cholecystitis and obstruction   . LFT elevation   . Stress fracture 01/01/2016  . Insomnia 12/16/2015  . Peripheral neuropathy 10/18/2015  . Renal insufficiency 03/06/2015  . HTN (hypertension) 03/06/2015  . Osteopenia 03/06/2015  . GERD (gastroesophageal reflux disease) 03/06/2015  . Vaginal  pessary present 03/06/2015   PHYSICAL THERAPY DISCHARGE SUMMARY  Visits from Start of Care: 9  Plan: Patient agrees to discharge.  Patient goals were partially met. Patient is being discharged due to being pleased with the current functional level.  ?????     RScot Jun PTA 12/28/2019, 1:24 PM  [place] [address]Phone: [phone]  Fax:  [phone] Name: MMEGHEN AKOPYANMRN: [id number] of Birth: [birth year]

## 2020-01-01 DIAGNOSIS — D171 Benign lipomatous neoplasm of skin and subcutaneous tissue of trunk: Secondary | ICD-10-CM | POA: Diagnosis not present

## 2020-01-01 DIAGNOSIS — Z85820 Personal history of malignant melanoma of skin: Secondary | ICD-10-CM | POA: Diagnosis not present

## 2020-01-01 DIAGNOSIS — L821 Other seborrheic keratosis: Secondary | ICD-10-CM | POA: Diagnosis not present

## 2020-01-01 DIAGNOSIS — D2261 Melanocytic nevi of right upper limb, including shoulder: Secondary | ICD-10-CM | POA: Diagnosis not present

## 2020-01-01 DIAGNOSIS — L723 Sebaceous cyst: Secondary | ICD-10-CM | POA: Diagnosis not present

## 2020-01-11 ENCOUNTER — Encounter: Payer: Self-pay | Admitting: Family Medicine

## 2020-01-11 DIAGNOSIS — R0902 Hypoxemia: Secondary | ICD-10-CM | POA: Diagnosis not present

## 2020-01-11 DIAGNOSIS — R55 Syncope and collapse: Secondary | ICD-10-CM | POA: Diagnosis not present

## 2020-01-11 DIAGNOSIS — R42 Dizziness and giddiness: Secondary | ICD-10-CM | POA: Diagnosis not present

## 2020-01-11 DIAGNOSIS — R000 Tachycardia, unspecified: Secondary | ICD-10-CM | POA: Diagnosis not present

## 2020-01-15 ENCOUNTER — Other Ambulatory Visit: Payer: Self-pay

## 2020-01-15 ENCOUNTER — Ambulatory Visit (INDEPENDENT_AMBULATORY_CARE_PROVIDER_SITE_OTHER): Payer: Medicare Other | Admitting: Podiatry

## 2020-01-15 DIAGNOSIS — G629 Polyneuropathy, unspecified: Secondary | ICD-10-CM

## 2020-01-15 DIAGNOSIS — Q828 Other specified congenital malformations of skin: Secondary | ICD-10-CM | POA: Diagnosis not present

## 2020-01-17 ENCOUNTER — Encounter: Payer: Self-pay | Admitting: Family Medicine

## 2020-01-17 ENCOUNTER — Ambulatory Visit (INDEPENDENT_AMBULATORY_CARE_PROVIDER_SITE_OTHER): Payer: Medicare Other | Admitting: Family Medicine

## 2020-01-17 ENCOUNTER — Other Ambulatory Visit: Payer: Self-pay

## 2020-01-17 VITALS — Resp 17 | Ht 63.0 in | Wt 169.0 lb

## 2020-01-17 DIAGNOSIS — Z23 Encounter for immunization: Secondary | ICD-10-CM

## 2020-01-17 DIAGNOSIS — R4189 Other symptoms and signs involving cognitive functions and awareness: Secondary | ICD-10-CM

## 2020-01-17 DIAGNOSIS — S81001A Unspecified open wound, right knee, initial encounter: Secondary | ICD-10-CM | POA: Diagnosis not present

## 2020-01-17 DIAGNOSIS — I10 Essential (primary) hypertension: Secondary | ICD-10-CM | POA: Diagnosis not present

## 2020-01-17 NOTE — Telephone Encounter (Signed)
Patient has appointment today. FYI.

## 2020-01-17 NOTE — Patient Instructions (Signed)
Good to see you again today!  I will be in touch with your labs asap Please do not drive until cleared by neurology You got your tetanus booster today

## 2020-01-17 NOTE — Progress Notes (Addendum)
[facility] [place], [place], [place] [address] [phone]  Date:  01/17/2020   Name:  Margaret [name]   DOB:  08-21-1940   MRN:  [id number]  PCP:  Darreld Mclean, MD    Chief Complaint: Dizziness (blood pressure issues, near syncope)   History of Present Illness:  Margaret [name] is a 79 y.o. very pleasant female patient who presents with the following:  Patient here today to follow-up on blood pressure-she had an episode of hypotension as below I saw her most recently in June prior to planned right total knee replacement Patient with history of hypertension, GERD, osteopenia, melanoma of left foot, renal insufficiency  She had her total knee replacement on 6/14, has been doing outpatient rehabilitation She had a great result from her surgery, she is having minimal pain.  She reports that she is finished with physical therapy and should be able to stop using her cane soon  Pt notes that she fainted (it sounds like she had an episode of true syncope) the same day of her knee surgery, thought to be from anesthesia.   Since that time she has had a few more episodes which are somewhat less clear  I received a message from [name] daughter [name] on August 12: Hi Dr [name] this is [name]. I wanted to let you know that I had to call 911 again this morning as my mom had another one of those fainting episodes. The last one she had occurred the day she had her knee replacement and thankfully she was in the hospital when it happened. They wanted her to go to the hospital this morning as her blood pressure was very low but she refused to go and they made Margaret [name] promise to call you and have her seen about this. I wanted to have her see you after the surgery because they did say for Margaret [name] to have you follow up with it. Things got so busy I forgot to reach out to you. Her blood pressure was 94 over 68 sitting and 136 /  90 laying down. When might you be able to see her? Thank you  Message from her daughter again today: Good morning Dr [name]. This is [name]. I wanted to let you know what happens to my mom before she passes out. She knows it's coming. She calls for me and says she's getting dizzy. Then soon she goes into this stare. She can't hear me, she just stares straight ahead. Next comes real heavy breathing from her gut. Her stomach rises and falls and her inhales are deep. Sometimes she will make a gurgling noise. The last three times she has peed herself. Then she comes to and she will remember feeling like she's going to pass out. The whole thing usually lasts a few minutes. This has been the order of events every time it happens to where now I even know what to expect. I wanted to send you this in case she tells you different. This is exactly what happens each time. Thank you for taking such good care of her  She brings me an EKG tracing from EMS 01/11/20; it appears virtually unchanged from previous EKG on chart I had her hold hydrochlorothiazide for possible hypotension after receiving message on August 12 She is still taking metoprolol Due for tetanus, would like to update today  No family history of seizure disorder   No CP  or SOB, no palpitations In general Margaret [name] is feeling okay  Patient Active Problem List   Diagnosis Date Noted  . OA (osteoarthritis) of knee 11/13/2019  . Total knee replacement status, 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  . Encounter for postoperative examination after surgery for malignant neoplasm 09/30/2018  . Malignant melanoma of left foot ([place]) 08/15/2018  . Plantar fasciitis of right foot 02/03/2017  . Porokeratosis 02/03/2017  . Acute pancreatitis due to calculus of common bile duct 09/17/2016  . Acute gallstone pancreatitis   . Calculus of gallbladder with acute cholecystitis and obstruction   . LFT elevation   . Stress fracture 01/01/2016  . Insomnia 12/16/2015  . Peripheral  neuropathy 10/18/2015  . Renal insufficiency 03/06/2015  . HTN (hypertension) 03/06/2015  . Osteopenia 03/06/2015  . GERD (gastroesophageal reflux disease) 03/06/2015  . Vaginal pessary present 03/06/2015    Past Medical History:  Diagnosis Date  . Anxiety   . Arthritis   . Cancer (HCC)    lt. foot melanoma  . Chronic kidney disease    lt. kidney cyst  . History of healed stress fracture 2013   bilateral feet  . Hx of phlebitis    reports remote hx of "superfical blood clots" never anticoagulated  . Hypertension   . Osteopenia     Past Surgical History:  Procedure Laterality Date  . CHOLECYSTECTOMY N/A 09/19/2016   Procedure: LAPAROSCOPIC CHOLECYSTECTOMY POSSIBLE INTRAOPERATIVE CHOLANGIOGRAM;  Surgeon: Rolm Bookbinder, MD;  Location: [facility];  Service: General;  Laterality: N/A;  . ERCP N/A 09/18/2016   Procedure: ENDOSCOPIC RETROGRADE CHOLANGIOPANCREATOGRAPHY (ERCP);  Surgeon: Doran Stabler, MD;  Location: [facility] ENDOSCOPY;  Service: Endoscopy;  Laterality: N/A;  . FOOT SURGERY     patient reports four foot surgeries  . FOOT SURGERY Left    bunion, hammer toe surgery  . FOOT SURGERY Right    shaved bunion, hammer toe correction  . KNEE ARTHROSCOPY Right 1997  . REPLACEMENT TOTAL KNEE Left   . 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 RETINAL DETACHMENT SURGERY    . RETINAL DETACHMENT SURGERY Left 2008  . THYROIDECTOMY, PARTIAL Right   . THYROIDECTOMY, PARTIAL  2005  . TOTAL KNEE ARTHROPLASTY Right 11/13/2019   Procedure: TOTAL KNEE ARTHROPLASTY SDDC;  Surgeon: Gaynelle Arabian, MD;  Location: [facility];  Service: Orthopedics;  Laterality: Right;  36min    Social History   Tobacco Use  . Smoking status: Former Smoker    Quit date: 06/02/1979    Years since quitting: 40.6  . Smokeless tobacco: Never Used  Vaping Use  . Vaping Use: Never used  Substance Use Topics  . Alcohol use: Yes    Alcohol/week: 1.0 standard drink    Types: 1 Glasses of wine per week    Comment: Occ   . Drug use: No    Family History  Problem Relation Age of Onset  . Heart disease Mother   . Heart attack Mother   . Heart disease Father   . Diabetes Maternal Grandmother   . Cancer Paternal Grandfather        stomach    Allergies  Allergen Reactions  . Keflex [Cephalexin] Diarrhea  . Percocet [Oxycodone-Acetaminophen] Other (See Comments)    Syncope (passed out)    Medication list has been reviewed and updated.  Current Outpatient Medications on File Prior to Visit  Medication Sig Dispense Refill  . acetaminophen (TYLENOL) 500 MG tablet Take 1,000 mg by mouth every 6 (six) hours as needed (pain.).     Marland Kitchen  ketoconazole (NIZORAL) 2 % cream Apply 1 application topically 2 (two) times daily as needed (skin irritation/rash.).     Marland Kitchen LORazepam (ATIVAN) 2 MG tablet TAKE 1-1&1/2 TABLETS BY MOUTH EVERY NIGHT AT BEDTIME AS NEEDED FOR SLEEP MAY TAKE 1 TABLET DURING DAY IF NEEDED 70 tablet 2  . metoprolol succinate (TOPROL-XL) 25 MG 24 hr tablet TAKE 1 TABLET(25 MG) BY MOUTH DAILY (Patient taking differently: Take 25 mg by mouth daily. ) 90 tablet 1  . NONFORMULARY OR COMPOUNDED ITEM [facility]:  Peripheral Neuropathy - Bupivacaine 1%, Doxepin 3%, Gabapentin 6%, Pentoxifylline 3%, Topiramate 1%. Apply 1-2 grams to affected area 3-4 times daily. (Patient taking differently: Apply 1 application topically 4 (four) times daily as needed (neuropathy/pain (feet)). [facility]:  Peripheral Neuropathy - Bupivacaine 1%, Doxepin 3%, Gabapentin 6%, Pentoxifylline 3%, Topiramate 1%. Apply 1-2 grams to affected area 3-4 times daily.) 100 each 11  . omeprazole (PRILOSEC) 20 MG capsule TAKE 1 CAPSULE BY MOUTH EVERY DAY AS NEEDED (Patient taking differently: Take 20 mg by mouth daily. ) 90 capsule 3  . pregabalin (LYRICA) 150 MG capsule Take 1 capsule (150 mg total) by mouth 2 (two) times daily. 60 capsule 2   No current facility-administered medications on file prior to visit.    Review of  Systems:  As per HPI- otherwise negative.   Physical Examination: Vitals:   01/17/20 1447  Resp: 17  SpO2: 98%   Vitals:   01/17/20 1447  Weight: 169 lb (76.7 kg)  Height: 5\' 3"  (1.6 m)   Body mass index is 29.94 kg/m. Ideal Body Weight: Weight in (lb) to have BMI = 25: 140.8  GEN: no acute distress. Overweight, looks well  HEENT: Atraumatic, Normocephalic.  Ears and Nose: No external deformity. CV: RRR, No M/G/R. No JVD. No thrill. No extra heart sounds. PULM: CTA B, no wheezes, crackles, rhonchi. No retractions. No resp. distress. No accessory muscle use. ABD: S, NT, ND, +BS. No rebound. No HSM. EXTR: No c/c/e PSYCH: Normally interactive. Conversant.  Right knee- recent operation healing well, excellent range of motion of knee Normal strength and sensation of all limbs She is still using a cane for ambulation  Supine 142/86, 70 Sitting 146/80, 70 Standing 140/88, 78  EKG from EMS c/w tracing from 7/21- no significant change noted  Assessment and Plan: Essential hypertension - Plan: CBC, Comprehensive metabolic panel, TSH  Change in level of consciousness - Plan: Ambulatory referral to Neurology  Unspecified open wound, right knee, initial encounter - Plan: Td vaccine greater than or equal to 7yo preservative free IM  Blood pressure under okay control today, her pressures have come up some since we stopped hydrochlorothiazide-this is okay as she was having some hypotensive symptoms Update tetanus vaccine Discussed situation in detail with Leda Gauze today.  I am not sure if she is having presyncope, or if this may be seizure activity And recent MyChart message, her daughter describes her looking awake but staring into space, being unresponsive, and having urinary incontinence-concerning for seizure I recommended that we have her see neurology for further evaluation and she is agreeable Advised patient that she should not drive until she is seen and cleared by neurology  due to risk of danger if she were to have an episode while driving Will plan further follow- up pending labs. Asked her to let me know if any changes or worsening of her condition in the meantime This visit occurred during the SARS-CoV-2 public health emergency.  Safety protocols were  in place, including screening questions prior to the visit, additional usage of staff PPE, and extensive cleaning of exam room while observing appropriate contact time as indicated for disinfecting solutions.    Signed Lamar Blinks, MD  addnd 8/20- received her labs as below  Results for orders placed or performed in visit on 01/17/20  CBC  Result Value Ref Range   WBC 7.1 4.0 - 10.5 K/uL   RBC 4.10 3.87 - 5.11 Mil/uL   Platelets 247.0 150 - 400 K/uL   Hemoglobin 13.1 12.0 - 15.0 g/dL   HCT 40.0 36 - 46 %   MCV 97.4 78.0 - 100.0 fl   MCHC 32.7 30.0 - 36.0 g/dL   RDW 14.3 11.5 - 15.5 %  Comprehensive metabolic panel  Result Value Ref Range   Sodium 141 135 - 145 mEq/L   Potassium 4.2 3.5 - 5.1 mEq/L   Chloride 102 96 - 112 mEq/L   CO2 30 19 - 32 mEq/L   Glucose, Bld 101 (H) 70 - 99 mg/dL   BUN 21 6 - 23 mg/dL   Creatinine, Ser 1.05 0.40 - 1.20 mg/dL   Total Bilirubin 0.3 0.2 - 1.2 mg/dL   Alkaline Phosphatase 82 39 - 117 U/L   AST 17 0 - 37 U/L   ALT 10 0 - 35 U/L   Total Protein 6.7 6.0 - 8.3 g/dL   Albumin 4.0 3.5 - 5.2 g/dL   GFR 50.50 (L) >60.00 mL/min   Calcium 9.6 8.4 - 10.5 mg/dL  TSH  Result Value Ref Range   TSH 1.18 0.35 - 4.50 uIU/mL   Message to pt

## 2020-01-18 LAB — COMPREHENSIVE METABOLIC PANEL
ALT: 10 U/L (ref 0–35)
AST: 17 U/L (ref 0–37)
Albumin: 4 g/dL (ref 3.5–5.2)
Alkaline Phosphatase: 82 U/L (ref 39–117)
BUN: 21 mg/dL (ref 6–23)
CO2: 30 mEq/L (ref 19–32)
Calcium: 9.6 mg/dL (ref 8.4–10.5)
Chloride: 102 mEq/L (ref 96–112)
Creatinine, Ser: 1.05 mg/dL (ref 0.40–1.20)
GFR: 50.5 mL/min — ABNORMAL LOW (ref >60.00)
Glucose, Bld: 101 mg/dL — ABNORMAL HIGH (ref 70–99)
Potassium: 4.2 mEq/L (ref 3.5–5.1)
Sodium: 141 mEq/L (ref 135–145)
Total Bilirubin: 0.3 mg/dL (ref 0.2–1.2)
Total Protein: 6.7 g/dL (ref 6.0–8.3)

## 2020-01-18 LAB — TSH: TSH: 1.18 u[IU]/mL (ref 0.35–4.50)

## 2020-01-18 LAB — CBC
HCT: 40 % (ref 36.0–46.0)
Hemoglobin: 13.1 g/dL (ref 12.0–15.0)
MCHC: 32.7 g/dL (ref 30.0–36.0)
MCV: 97.4 fl (ref 78.0–100.0)
Platelets: 247 10*3/uL (ref 150.0–400.0)
RBC: 4.1 Mil/uL (ref 3.87–5.11)
RDW: 14.3 % (ref 11.5–15.5)
WBC: 7.1 10*3/uL (ref 4.0–10.5)

## 2020-01-19 ENCOUNTER — Encounter: Payer: Self-pay | Admitting: Family Medicine

## 2020-01-22 NOTE — Progress Notes (Signed)
Subjective: 79 year old female presents the office today due to painful calluses that she would have trimmed.  She has no open sores no other concerns today.  She is still on Lyrica increased dose has been helping. Denies any systemic complaints such as fevers, chills, nausea, vomiting. No acute changes since last appointment, and no other complaints at this time.   Objective: AAO x3, NAD Hyperkeratotic lesions present left foot metatarsal area as well as fifth metatarsal base.  Also present on the right foot submetatarsal x2 and right foot fifth metatarsal base.  There is no ongoing ulceration drainage or any signs of infection. Multiple digital deformities are present which are unchanged.  Dorsal midfoot arthritic changes noted. No pain with calf compression, swelling, warmth, erythema  Assessment: Symptomatic callus formation bilaterally, neuropathy  Plan: -All treatment options discussed with the patient including all alternatives, risks, complications.  -Debrided hyperkeratotic lesions x3 without any complications or bleeding -Continue Lyrica as prescribed.  She has been passing out but change in dose does not seem to correlate to when this started.  She is followed with her primary care physician for this. -Patient encouraged to call the office with any questions, concerns, change in symptoms.   Trula Slade DPM

## 2020-01-26 ENCOUNTER — Ambulatory Visit (INDEPENDENT_AMBULATORY_CARE_PROVIDER_SITE_OTHER): Payer: Medicare Other | Admitting: Neurology

## 2020-01-26 ENCOUNTER — Encounter: Payer: Self-pay | Admitting: Neurology

## 2020-01-26 ENCOUNTER — Other Ambulatory Visit: Payer: Self-pay

## 2020-01-26 VITALS — BP 156/89 | HR 73 | Ht 63.0 in | Wt 170.0 lb

## 2020-01-26 DIAGNOSIS — R404 Transient alteration of awareness: Secondary | ICD-10-CM | POA: Diagnosis not present

## 2020-01-26 NOTE — Progress Notes (Signed)
NEUROLOGY CONSULTATION NOTE  [name] MRN: [id number] DOB: 05/22/1941  Referring provider: Dr. Lamar Blinks Primary care provider: Dr. Lamar Blinks  Reason for consult:  Change in level of consciousness  Dear Dr [name]:  Thank you for your kind referral of Margaret [name] for consultation of the above symptoms. Although her history is well known to you, please allow me to reiterate it for the purpose of our medical record. The patient was accompanied to the clinic by her neighbor Margaret [name], her granddaughter Margaret [name] is on speakerphone to provide collateral information. Records and images were personally reviewed where available.   HISTORY OF PRESENT ILLNESS: This is a pleasant 79 year old right-handed woman with a history of hypertension presenting for evaluation of recurrent episodes of loss of awareness/consciousness. Her neighbor Margaret [name] is present and her granddaughter Margaret [name] on speakerphone to provide additional information. She lives with her daughter Margaret [name], PCP notes regarding [name]'s accounts were also reviewed. They report 6 syncopal episodes. The first episode of loss of consciousness occurred in 1988-09-20, her father had passed away, then soon after she recalls getting up and passing out at dinner, no prior warning symptoms. She did well for many years until Thanksgiving 2018, she got up and said she would pass out. She recalls feeling dizzy, hot, then in a few seconds, she was witnessed to go into a stare. Her eyes were open but she could not hear them and was unresponsive. They sat her down, she was very pale, eyes rolling back, unable to speak. She made a kind of gurgling sound. Family lay her down and she started coming to. The next event occurred in 20-Sep-2017 after cataract surgery, she reports getting sick the next day and going to bed. She got up and passed out. She recalls getting sick again, then passing out again. She had knee surgery in June and was noted to have a  syncopal event while working with PT, preventing discharge on same day as originally planned. There is not much information on EPIC, family reports they had a hard time reviving her. The last episode was 2 weeks ago, she reports she was sick again, she took a shower and started getting dizzy. She called her daughter alerting her she was going down. She had urinary incontinence, which she reports occurred after passing out because she was so nervous. Her daughter reported BP was 94/68 sitting and 136/90 supine. HCTZ was stopped after her most recent syncopal event. Her daughter also provided additional information that she would call for Margaret [name] saying she is getting dizzy. She then goes into a stare, unable to hear, staring straight ahead. She starts having heavy breathing from her gut and sometimes makes a gurgling noise. She had urinary incontinence the last 3 times it occurred. The entire episode lasts a few minutes. She reports feeling fine after, no headache, focal numbness/tingling/weakness, she is not dizzy any more after. She denies any olfactory/gustatory hallucinations, rising epigastric sensation, myoclonic jerks. She has occasional left monocular diplopia when supine. She takes Tylenol for back and knee pain. She states her balance is very bad and has had falls due to balance issues. Family has noticed memory changes. She gets confused with appointments, forgets names. She manages her own medications. She takes lorazepam every night for sleep and prn for anxiety. She had a normal birth and early development.  There is no history of febrile convulsions, CNS infections such as meningitis/encephalitis, significant traumatic brain injury, neurosurgical procedures, or family history of seizures.  PAST MEDICAL HISTORY: Past Medical History:  Diagnosis Date  . Anxiety   . Arthritis   . Cancer (HCC)    lt. foot melanoma  . Chronic kidney disease    lt. kidney cyst  . History of healed stress fracture  2013   bilateral feet  . Hx of phlebitis    reports remote hx of "superfical blood clots" never anticoagulated  . Hypertension   . Osteopenia     PAST SURGICAL HISTORY: Past Surgical History:  Procedure Laterality Date  . CHOLECYSTECTOMY N/A 09/19/2016   Procedure: LAPAROSCOPIC CHOLECYSTECTOMY POSSIBLE INTRAOPERATIVE CHOLANGIOGRAM;  Surgeon: Rolm Bookbinder, MD;  Location: [facility];  Service: General;  Laterality: N/A;  . ERCP N/A 09/18/2016   Procedure: ENDOSCOPIC RETROGRADE CHOLANGIOPANCREATOGRAPHY (ERCP);  Surgeon: Doran Stabler, MD;  Location[facility] ENDOSCOPY;  Service: Endoscopy;  Laterality: N/A;  . FOOT SURGERY     patient reports four foot surgeries  . FOOT SURGERY Left    bunion, hammer toe surgery  . FOOT SURGERY Right    shaved bunion, hammer toe correction  . KNEE ARTHROSCOPY Right 1997  . REPLACEMENT TOTAL KNEE Left   . 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 RETINAL DETACHMENT SURGERY    . RETINAL DETACHMENT SURGERY Left 2008  . THYROIDECTOMY, PARTIAL Right   . THYROIDECTOMY, PARTIAL  2005  . TOTAL KNEE ARTHROPLASTY Right 11/13/2019   Procedure: TOTAL KNEE ARTHROPLASTY SDDC;  Surgeon: Gaynelle Arabian, MD;  Location: [facility];  Service: Orthopedics;  Laterality: Right;  58min    MEDICATIONS: Current Outpatient Medications on File Prior to Visit  Medication Sig Dispense Refill  . acetaminophen (TYLENOL) 500 MG tablet Take 1,000 mg by mouth every 6 (six) hours as needed (pain.).     Marland Kitchen ketoconazole (NIZORAL) 2 % cream Apply 1 application topically 2 (two) times daily as needed (skin irritation/rash.).     Marland Kitchen LORazepam (ATIVAN) 2 MG tablet TAKE 1-1&1/2 TABLETS BY MOUTH EVERY NIGHT AT BEDTIME AS NEEDED FOR SLEEP MAY TAKE 1 TABLET DURING DAY IF NEEDED 70 tablet 2  . metoprolol succinate (TOPROL-XL) 25 MG 24 hr tablet TAKE 1 TABLET(25 MG) BY MOUTH DAILY (Patient taking differently: Take 25 mg by mouth daily. ) 90 tablet 1  . NONFORMULARY OR COMPOUNDED ITEM [facility]:   Peripheral Neuropathy - Bupivacaine 1%, Doxepin 3%, Gabapentin 6%, Pentoxifylline 3%, Topiramate 1%. Apply 1-2 grams to affected area 3-4 times daily. (Patient taking differently: Apply 1 application topically 4 (four) times daily as needed (neuropathy/pain (feet)). [facility]:  Peripheral Neuropathy - Bupivacaine 1%, Doxepin 3%, Gabapentin 6%, Pentoxifylline 3%, Topiramate 1%. Apply 1-2 grams to affected area 3-4 times daily.) 100 each 11  . omeprazole (PRILOSEC) 20 MG capsule TAKE 1 CAPSULE BY MOUTH EVERY DAY AS NEEDED (Patient taking differently: Take 20 mg by mouth daily. ) 90 capsule 3  . pregabalin (LYRICA) 150 MG capsule Take 1 capsule (150 mg total) by mouth 2 (two) times daily. 60 capsule 2   No current facility-administered medications on file prior to visit.    ALLERGIES: Allergies  Allergen Reactions  . Keflex [Cephalexin] Diarrhea  . Percocet [Oxycodone-Acetaminophen] Other (See Comments)    Syncope (passed out)    FAMILY HISTORY: Family History  Problem Relation Age of Onset  . Heart disease Mother   . Heart attack Mother   . Heart disease Father   . Diabetes Maternal Grandmother   . Cancer Paternal Grandfather        stomach    SOCIAL  HISTORY: Social History   Socioeconomic History  . Marital status: Widowed    Spouse name: Not on file  . Number of children: Not on file  . Years of education: Not on file  . Highest education level: Not on file  Occupational History  . Not on file  Tobacco Use  . Smoking status: Former Smoker    Quit date: 06/02/1979    Years since quitting: 40.6  . Smokeless tobacco: Never Used  Vaping Use  . Vaping Use: Never used  Substance and Sexual Activity  . Alcohol use: Yes    Alcohol/week: 1.0 standard drink    Types: 1 Glasses of wine per week    Comment: Occ  . Drug use: No  . Sexual activity: Not Currently  Other Topics Concern  . 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 Difficulty of Paying Living Expenses: Not on file  Food Insecurity:   . Worried About Charity fundraiser in the Last Year: Not on file  . Ran Out of Food in the Last Year: Not on file  Transportation Needs:   . Lack of Transportation (Medical): Not on file  . Lack of Transportation (Non-Medical): Not on file  Physical Activity:   . Days of Exercise per Week: Not on file  . Minutes of Exercise per Session: Not on file  Stress:   . Feeling of Stress : Not on file  Social Connections:   . Frequency of Communication with Friends and Family: Not on file  . Frequency of Social Gatherings with Friends and Family: Not on file  . Attends Religious Services: Not on file  . Active Member of Clubs or Organizations: Not on file  . Attends Archivist Meetings: Not on file  . Marital Status: Not on file  Intimate Partner Violence:   . Fear of Current or Ex-Partner: Not on file  . Emotionally Abused: Not on file  . Physically Abused: Not on file  . Sexually Abused: Not on file    PHYSICAL EXAM: Vitals:   01/26/20 1304  BP: (!) 156/89  Pulse: 73  SpO2: 97%   General: No acute distress Head:  Normocephalic/atraumatic Skin/Extremities: No rash, no edema Neurological Exam: Mental status: alert and oriented to person, place, and time, no dysarthria or aphasia, Fund of knowledge is appropriate.  Recent and remote memory are intact. 3/3 delayed recall.  Attention and concentration are normal.  5/5 WORLD backwards. phrases. Cranial nerves: CN I: not tested CN II: pupils equal, round and reactive to light, visual fields intact CN III, IV, VI:  full range of motion, no nystagmus, no ptosis CN V: facial sensation intact CN VII: upper and lower face  symmetric CN VIII: hearing intact to conversation Bulk & Tone: normal, no fasciculations. Motor: 5/5 throughout with no pronator drift. Sensation: intact to light touch, cold, pin, vibration and joint position sense.  No extinction to double simultaneous stimulation.  Romberg test negative Deep Tendon Reflexes: +2 throughout, negative Hoffman sign Cerebellar: no incoordination on finger to nose testing Gait: slow and cautious, no ataxia Tremor: none   IMPRESSION: This is a pleasant 79 year old right-handed woman with a history of  hypertension presenting for evaluation of recurrent episodes of loss of awareness/consciousness. Her neurological exam is non-focal. Etiology of symptoms unclear, seizure versus syncope. Semiology has features suggestive of syncope, BP has been low with these events, however the staring spells are concerning. MRI brain with and without contrast and a 1-hour EEG will be ordered to assess for focal abnormalities that increase risk for recurrent seizures. If normal, we will plan for a 24-hour EEG to further classify events. She would benefit from Cardiology evaluation as well, continue to monitor BP, HCTZ has been discontinued by her PCP after last event. [place] driving laws were discussed with the patient, and she knows to stop driving after an episode of loss of consciousness, until 6 months event-free. Follow-up after tests, they know to call for any changes.   Thank you for allowing me to participate in the care of this patient. Please do not hesitate to call for any questions or concerns.   Ellouise Newer, M.D.  CC: Dr. [name]

## 2020-01-26 NOTE — Patient Instructions (Signed)
1. Schedule open MRI brain with and without contrast  2. Schedule 1-hour EEG, then if normal, we will plan for 24-hour EEG  3. Monitor your BP  4. It is prudent to recommend that all persons should be free of episodes of loss of consciousness for at least six months to be granted the driving privilege." ([employer], Second Edition, Medical Review Branch, Engineer, site, Division of [employer] of [employer], July 2004)  5. Follow-up after tests, call for any changes

## 2020-01-31 ENCOUNTER — Encounter: Payer: Self-pay | Admitting: Neurology

## 2020-02-02 DIAGNOSIS — D4102 Neoplasm of uncertain behavior of left kidney: Secondary | ICD-10-CM | POA: Diagnosis not present

## 2020-02-02 DIAGNOSIS — N289 Disorder of kidney and ureter, unspecified: Secondary | ICD-10-CM | POA: Diagnosis not present

## 2020-02-02 DIAGNOSIS — K449 Diaphragmatic hernia without obstruction or gangrene: Secondary | ICD-10-CM | POA: Diagnosis not present

## 2020-02-06 ENCOUNTER — Other Ambulatory Visit: Payer: Self-pay | Admitting: Family Medicine

## 2020-02-06 DIAGNOSIS — D4102 Neoplasm of uncertain behavior of left kidney: Secondary | ICD-10-CM | POA: Diagnosis not present

## 2020-02-07 ENCOUNTER — Other Ambulatory Visit: Payer: Self-pay

## 2020-02-07 ENCOUNTER — Ambulatory Visit (INDEPENDENT_AMBULATORY_CARE_PROVIDER_SITE_OTHER): Payer: Medicare Other | Admitting: Neurology

## 2020-02-07 DIAGNOSIS — R404 Transient alteration of awareness: Secondary | ICD-10-CM

## 2020-02-09 ENCOUNTER — Telehealth: Payer: Self-pay

## 2020-02-09 NOTE — Telephone Encounter (Signed)
-----   Message from Cameron Sprang, MD sent at 02/09/2020  1:00 PM EDT ----- Daughter requesting to call her for results: Pls let her know the EEG was normal, proceed with 24-hour EEG as scheduled. Would also recommend she see Cardiology for evaluation, we can send referral or PCP, whichever she prefers. Thanks

## 2020-02-09 NOTE — Procedures (Signed)
ELECTROENCEPHALOGRAM REPORT  Date of Study: 02/07/2020  Patient's Name: Margaret [name] MRN: [id number] Date of Birth: June 25, 1940  Referring Provider: Dr. Ellouise Newer  Clinical History: This is a 79 year old woman with recurrent episodes of loss of awareness. EEG for classification.  Medications: LYRICA 150 MG capsule TYLENOL 500 MG tablet NIZORAL 2 % cream ATIVAN 2 MG tablet TOPROL-XL 25 MG 24 hr tablet PRILOSEC 20 MG capsule   Technical Summary: A multichannel digital 1-hour EEG recording measured by the international 10-20 system with electrodes applied with paste and impedances below 5000 ohms performed in our laboratory with EKG monitoring in an awake and asleep patient.  Hyperventilation was not performed. Photic stimulation was performed.  The digital EEG was referentially recorded, reformatted, and digitally filtered in a variety of bipolar and referential montages for optimal display.    Description: The patient is awake and asleep during the recording.  During maximal wakefulness, there is a symmetric, medium voltage 10 Hz posterior dominant rhythm that attenuates with eye opening.  The record is symmetric.  During drowsiness and sleep, there is an increase in theta slowing of the background.  Vertex waves and symmetric sleep spindles were seen.  Photic stimulation did not elicit any abnormalities.  There were no epileptiform discharges or electrographic seizures seen.    EKG lead showed occasional extrasystolic beats.   Impression: This 1-hour awake and asleep EEG is normal.    Clinical Correlation: A normal EEG does not exclude a clinical diagnosis of epilepsy.  If further clinical questions remain, prolonged EEG may be helpful.  Clinical correlation is advised.   Ellouise Newer, M.D.

## 2020-02-09 NOTE — Telephone Encounter (Signed)
Spoke with pt daughter informed her that EEG was normal, proceed with 24-hour EEG as scheduled. Would also recommend she see Cardiology for evaluation. They will have PCP do cardiology referral

## 2020-02-13 ENCOUNTER — Ambulatory Visit: Payer: Medicare Other | Admitting: Podiatry

## 2020-02-15 ENCOUNTER — Encounter: Payer: Self-pay | Admitting: Obstetrics & Gynecology

## 2020-02-15 ENCOUNTER — Ambulatory Visit (INDEPENDENT_AMBULATORY_CARE_PROVIDER_SITE_OTHER): Payer: Medicare Other | Admitting: Obstetrics & Gynecology

## 2020-02-15 ENCOUNTER — Other Ambulatory Visit: Payer: Self-pay

## 2020-02-15 ENCOUNTER — Ambulatory Visit (INDEPENDENT_AMBULATORY_CARE_PROVIDER_SITE_OTHER): Payer: Medicare Other | Admitting: Podiatry

## 2020-02-15 VITALS — BP 137/84 | HR 88

## 2020-02-15 DIAGNOSIS — M79673 Pain in unspecified foot: Secondary | ICD-10-CM | POA: Diagnosis not present

## 2020-02-15 DIAGNOSIS — N819 Female genital prolapse, unspecified: Secondary | ICD-10-CM | POA: Diagnosis not present

## 2020-02-15 DIAGNOSIS — G8929 Other chronic pain: Secondary | ICD-10-CM

## 2020-02-15 DIAGNOSIS — Q828 Other specified congenital malformations of skin: Secondary | ICD-10-CM | POA: Diagnosis not present

## 2020-02-15 DIAGNOSIS — G629 Polyneuropathy, unspecified: Secondary | ICD-10-CM | POA: Diagnosis not present

## 2020-02-15 NOTE — Progress Notes (Signed)
  HPI Pt presents today to have pessary removed and cleaned.  She denies problems and reports that her pessary is working very well.  She denies further complaints.  She reports that since her last visit, she had an emergency lap for 2 hernias with bowel surgery. She has also had dizziness which has led to a referral from her primary care provider to a cardiologist. She is very worried about that visit.     Review of Systems       Objective:   Physical Exam BP 137/84   Pulse 88   CONSTITUTIONAL: Well-developed, well-nourished female in no acute distress.  HENT:  Normocephalic, atraumatic EYES: Conjunctivae and EOM are normal. No scleral icterus.  NECK: Normal range of motion SKIN: Skin is warm and dry. No rash noted. Not diaphoretic.No pallor. [place]: Alert and oriented to person, place, and time. Normal coordination.  GYN: Pessary removed and cleaned; ext gen no issues noted. No excoriations or breakdown of skin      Assessment:  Pessary check  Pelvic organ prolapse Urinary incontinence- improved since pessary.      Plan:   f/u in 3 months to have pessary removed and cleaned F/u sooner prn   Lydiann Bonifas L. Harraway-Smith, M.D., Cherlynn June

## 2020-02-15 NOTE — Progress Notes (Signed)
Patient presents for pessary removal and cleaning. Kathrene Alu RN

## 2020-02-18 NOTE — Progress Notes (Signed)
Subjective: 79 year old female presents the office today due to painful calluses that she would have trimmed.  Otherwise from a foot standpoint she has been doing well. No new concerns to her feet today. Denies any systemic complaints such as fevers, chills, nausea, vomiting. No acute changes since last appointment, and no other complaints at this time.   Objective: AAO x3, NAD Hyperkeratotic lesions present left foot metatarsal area as well as fifth metatarsal base.  Also present on the right foot submetatarsal x2 and right foot fifth metatarsal base.  There is no ongoing ulceration drainage or any signs of infection. Multiple digital deformities are present which are unchanged.  Dorsal midfoot arthritic changes noted. No pain with calf compression, swelling, warmth, erythema  Assessment: Symptomatic callus formation bilaterally, neuropathy  Plan: -All treatment options discussed with the patient including all alternatives, risks, complications.  -Debrided hyperkeratotic lesions x3 without any complications or bleeding -Continue Lyrica as prescribed.  -Patient encouraged to call the office with any questions, concerns, change in symptoms.   Trula Slade DPM

## 2020-02-21 ENCOUNTER — Encounter: Payer: Self-pay | Admitting: Family Medicine

## 2020-02-21 ENCOUNTER — Telehealth: Payer: Self-pay | Admitting: Family Medicine

## 2020-02-21 DIAGNOSIS — Z23 Encounter for immunization: Secondary | ICD-10-CM | POA: Diagnosis not present

## 2020-02-21 NOTE — Telephone Encounter (Signed)
Patient states she has an MRI scheduled on 02/27/20. She is requesting medication to calm her down.   Please advise

## 2020-02-23 ENCOUNTER — Telehealth: Payer: Self-pay | Admitting: Neurology

## 2020-02-23 NOTE — Telephone Encounter (Signed)
I believe this is the patient that is very claustrophobic, we may have sent 2 orders because she wanted to do the open MRI after we put in order. So would do open MRI at [facility] and cancel the [facility] then. Thanks

## 2020-02-23 NOTE — Telephone Encounter (Signed)
Patient's daughter called in stating the patient is scheduled for an MRI of her brain twice. The first is Monday 02/26/20 at [place] with [facility] and the second is Tuesday 02/27/20 at 11:40 with [facility]. Does she need 2 MRI's? She would like for someone to call her and if she can't answer then leave a message, but also to then call the patient.

## 2020-02-23 NOTE — Telephone Encounter (Signed)
Spoke to pt daughter patient that is very claustrophobic, we may have sent 2 orders because she wanted to do the open MRI after we put in order. So would do open MRI at [facility] and cancel the [facility] then. [facility] called and MRI DC.

## 2020-02-26 ENCOUNTER — Telehealth: Payer: Self-pay

## 2020-02-26 DIAGNOSIS — R404 Transient alteration of awareness: Secondary | ICD-10-CM | POA: Diagnosis not present

## 2020-02-26 NOTE — Telephone Encounter (Signed)
-----   Message from Cameron Sprang, MD sent at 02/26/2020  2:18 PM EDT ----- Regarding: MRI results Pls let patient/daughter know the MRI brain looks good, no evidence of tumor, stroke, or bleed. It shows age-related changes. Thanks ----- Message ----- From: Leeroy Bock Sent: 02/26/2020  11:09 AM EDT To: Cameron Sprang, MD

## 2020-02-26 NOTE — Telephone Encounter (Signed)
Patient advised of results.

## 2020-02-27 ENCOUNTER — Other Ambulatory Visit: Payer: Medicare Other

## 2020-02-28 ENCOUNTER — Other Ambulatory Visit: Payer: Medicare Other

## 2020-02-29 ENCOUNTER — Other Ambulatory Visit: Payer: Self-pay

## 2020-02-29 ENCOUNTER — Ambulatory Visit (INDEPENDENT_AMBULATORY_CARE_PROVIDER_SITE_OTHER): Payer: Medicare Other | Admitting: Neurology

## 2020-02-29 DIAGNOSIS — R404 Transient alteration of awareness: Secondary | ICD-10-CM

## 2020-03-05 ENCOUNTER — Encounter: Payer: Self-pay | Admitting: Family Medicine

## 2020-03-05 DIAGNOSIS — I10 Essential (primary) hypertension: Secondary | ICD-10-CM

## 2020-03-05 DIAGNOSIS — R4189 Other symptoms and signs involving cognitive functions and awareness: Secondary | ICD-10-CM

## 2020-03-06 ENCOUNTER — Telehealth: Payer: Self-pay | Admitting: Family Medicine

## 2020-03-06 NOTE — Progress Notes (Signed)
°  Chronic Care Management   Note  03/06/2020 Name: DALMA PANCHAL MRN: [id number] DOB: 08-27-1940  TRYSTIN HARGROVE is a 79 y.o. year old female who is a primary care patient of Copland, Gay Filler, MD. I reached out to Alphonzo Severance by phone today in response to a referral sent by Ms. Volney American Cowin's PCP, Copland, Gay Filler, MD.   Ms. Belmar was given information about Chronic Care Management services today including:  1. CCM service includes personalized support from designated clinical staff supervised by her physician, including individualized plan of care and coordination with other care providers 2. 24/7 contact phone numbers for assistance for urgent and routine care needs. 3. Service will only be billed when office clinical staff spend 20 minutes or more in a month to coordinate care. 4. Only one practitioner may furnish and bill the service in a calendar month. 5. The patient may stop CCM services at any time (effective at the end of the month) by phone call to the office staff.   Patient wishes to consider information provided and/or speak with a member of the care team before deciding about enrollment in care management services.   Follow up plan  Carley Perdue [employer] Scheduler

## 2020-03-08 ENCOUNTER — Telehealth: Payer: Self-pay

## 2020-03-08 NOTE — Telephone Encounter (Signed)
Spoke with pt brain wave test was normal, no seizure activity seen

## 2020-03-08 NOTE — Procedures (Signed)
ELECTROENCEPHALOGRAM REPORT  Dates of Recording: 02/29/2020 9:29AM to 03/01/2020 9:37AM Patient's Name: Margaret [name] MRN: [id number] Date of Birth: [birth year]  Referring Provider: Dr. Ellouise Newer  Procedure: 24-hour ambulatory video EEG  History: This is a 79 year old woman with recurrent episodes of loss of awareness/consciousness. EEG for classification.  Medications:  LYRICA 150 MG capsule TYLENOL 500 MG tablet NIZORAL 2 % cream ATIVAN 2 MG tablet TOPROL-XL 25 MG 24 hr tablet PRILOSEC 20 MG capsule   Technical Summary: This is a 24-hour multichannel digital video EEG recording measured by the international 10-20 system with electrodes applied with paste and impedances below 5000 ohms performed as portable with EKG monitoring.  The digital EEG was referentially recorded, reformatted, and digitally filtered in a variety of bipolar and referential montages for optimal display.    DESCRIPTION OF RECORDING: During maximal wakefulness, the background activity consisted of a symmetric 8 Hz posterior dominant rhythm which was reactive to eye opening.  There were no epileptiform discharges or focal slowing seen in wakefulness.  During the recording, the patient progresses through wakefulness, drowsiness, and Stage 2 sleep.  Again, there were no epileptiform discharges seen.  Events: Typical events were not captured.   There were no electrographic seizures seen.  EKG lead was unremarkable.  IMPRESSION: This 24-hour ambulatory video EEG study is normal.    CLINICAL CORRELATION: A normal EEG does not exclude a clinical diagnosis of epilepsy.  If further clinical questions remain, inpatient video EEG monitoring may be helpful.   Ellouise Newer, M.D.

## 2020-03-08 NOTE — Telephone Encounter (Signed)
-----   Message from Cameron Sprang, MD sent at 03/08/2020  9:59 AM EDT ----- Pls let daughter know the brain wave test was normal, no seizure activity seen. Thanks

## 2020-03-11 ENCOUNTER — Other Ambulatory Visit: Payer: Self-pay | Admitting: Family Medicine

## 2020-03-11 DIAGNOSIS — F5102 Adjustment insomnia: Secondary | ICD-10-CM

## 2020-03-11 NOTE — Telephone Encounter (Signed)
Last written: 11/06/19 Last ov: 01/17/20 Next ov: 04/03/20 Contract: none UDS: none

## 2020-03-13 ENCOUNTER — Telehealth: Payer: Self-pay | Admitting: Family Medicine

## 2020-03-13 NOTE — Progress Notes (Signed)
  Chronic Care Management   Outreach Note  03/13/2020 Name: Margaret [name] MRN: [id number] DOB: 02/07/1941  Referred by: Darreld Mclean, MD Reason for referral : No chief complaint on file.   A second unsuccessful telephone outreach was attempted today. The patient was referred to pharmacist for assistance with care management and care coordination.  Follow Up Plan:   Carley Perdue [employer] Scheduler

## 2020-03-14 ENCOUNTER — Other Ambulatory Visit (HOSPITAL_BASED_OUTPATIENT_CLINIC_OR_DEPARTMENT_OTHER): Payer: Self-pay | Admitting: Family Medicine

## 2020-03-14 ENCOUNTER — Other Ambulatory Visit: Payer: Self-pay

## 2020-03-14 ENCOUNTER — Telehealth: Payer: Self-pay

## 2020-03-14 DIAGNOSIS — Z1231 Encounter for screening mammogram for malignant neoplasm of breast: Secondary | ICD-10-CM

## 2020-03-14 MED ORDER — PREGABALIN 150 MG PO CAPS: 150.0000 mg | ORAL_CAPSULE | Freq: Two times a day (BID) | ORAL | 2 refills | Status: DC

## 2020-03-14 MED ORDER — PREGABALIN 150 MG PO CAPS
150.0000 mg | ORAL_CAPSULE | Freq: Two times a day (BID) | ORAL | 2 refills | Status: DC
Start: 2020-03-14 — End: 2020-06-24

## 2020-03-14 NOTE — Telephone Encounter (Signed)
Patient called for refill of Lyrica 150mg . Per the last note, patient is to continue this medication. Refill sent to [facility] on [address]  thanks

## 2020-03-14 NOTE — Addendum Note (Signed)
Addended bySherryle Lis, Kadi Hession R on: 03/14/2020 12:03 PM   Modules accepted: Orders

## 2020-03-18 ENCOUNTER — Ambulatory Visit: Payer: Medicare Other | Admitting: Podiatry

## 2020-03-18 ENCOUNTER — Encounter: Payer: Medicare Other | Admitting: Family Medicine

## 2020-03-21 ENCOUNTER — Ambulatory Visit (INDEPENDENT_AMBULATORY_CARE_PROVIDER_SITE_OTHER): Payer: Medicare Other | Admitting: Podiatry

## 2020-03-21 ENCOUNTER — Other Ambulatory Visit: Payer: Self-pay

## 2020-03-21 DIAGNOSIS — G629 Polyneuropathy, unspecified: Secondary | ICD-10-CM | POA: Diagnosis not present

## 2020-03-21 DIAGNOSIS — M2011 Hallux valgus (acquired), right foot: Secondary | ICD-10-CM

## 2020-03-21 DIAGNOSIS — G8929 Other chronic pain: Secondary | ICD-10-CM | POA: Diagnosis not present

## 2020-03-21 DIAGNOSIS — Q828 Other specified congenital malformations of skin: Secondary | ICD-10-CM

## 2020-03-21 DIAGNOSIS — M79673 Pain in unspecified foot: Secondary | ICD-10-CM | POA: Diagnosis not present

## 2020-03-21 DIAGNOSIS — M2061 Acquired deformities of toe(s), unspecified, right foot: Secondary | ICD-10-CM

## 2020-03-21 NOTE — Progress Notes (Signed)
Referring-Jessica Copland, MD Reason for referral-syncope  HPI: 79 year old female for evaluation of syncope at request of Lamar Blinks, MD.  Recently seen by neurology and felt to have a seizure versus syncope.  EEG and ambulatory EEG normal.  MRI with no significant abnormality.  Over the past several years patient has had 5-6 episodes of syncope.  Typically her episodes occur when she changes positions from lying to standing.  She will get dizzy and then have syncope.  There was no preceding palpitations, dyspnea, nausea, diaphoresis or chest pain.  She is unconscious for 1 minute approximately.  She did have an episode when she was coming out of the shower.  There is also description where she has her eyes open but will not respond.  Note her HCTZ was discontinued and she has not had recurrent episodes since that time.  Also note her blood pressure was noted to be low at times following her syncopal episodes.  There is otherwise no dyspnea on exertion, exertional chest pain or pedal edema.  Cardiology now asked to evaluate.  Current Outpatient Medications  Medication Sig Dispense Refill  . acetaminophen (TYLENOL) 500 MG tablet Take 1,000 mg by mouth every 6 (six) hours as needed (pain.).     Marland Kitchen ketoconazole (NIZORAL) 2 % cream Apply 1 application topically 2 (two) times daily as needed (skin irritation/rash.).     Marland Kitchen LORazepam (ATIVAN) 2 MG tablet TAKE 1 TO 1&1/2 TABLETS BY MOUTH EVERY NIGHT AT BEDTIME AS NEEDED FOR SLEEP. MAY TAKE 1 TABLET DURING DAY IF NEEDED 70 tablet 2  . metoprolol succinate (TOPROL-XL) 25 MG 24 hr tablet TAKE 1 TABLET(25 MG) BY MOUTH DAILY 90 tablet 1  . NONFORMULARY OR COMPOUNDED ITEM [facility]:  Peripheral Neuropathy - Bupivacaine 1%, Doxepin 3%, Gabapentin 6%, Pentoxifylline 3%, Topiramate 1%. Apply 1-2 grams to affected area 3-4 times daily. (Patient taking differently: Apply 1 application topically 4 (four) times daily as needed (neuropathy/pain (feet)).  [facility]:  Peripheral Neuropathy - Bupivacaine 1%, Doxepin 3%, Gabapentin 6%, Pentoxifylline 3%, Topiramate 1%. Apply 1-2 grams to affected area 3-4 times daily.) 100 each 11  . omeprazole (PRILOSEC) 20 MG capsule TAKE 1 CAPSULE BY MOUTH EVERY DAY AS NEEDED (Patient taking differently: Take 20 mg by mouth daily. ) 90 capsule 3  . pregabalin (LYRICA) 150 MG capsule Take 1 capsule (150 mg total) by mouth 2 (two) times daily. 60 capsule 2   No current facility-administered medications for this visit.    Allergies  Allergen Reactions  . Keflex [Cephalexin] Diarrhea  . Percocet [Oxycodone-Acetaminophen] Other (See Comments)    Syncope (passed out)     Past Medical History:  Diagnosis Date  . Anxiety   . Arthritis   . Cancer (HCC)    lt. foot melanoma  . Chronic kidney disease    lt. kidney cyst  . History of healed stress fracture 2013   bilateral feet  . Hx of phlebitis    reports remote hx of "superfical blood clots" never anticoagulated  . Hypertension   . Osteopenia     Past Surgical History:  Procedure Laterality Date  . CHOLECYSTECTOMY N/A 09/19/2016   Procedure: LAPAROSCOPIC CHOLECYSTECTOMY POSSIBLE INTRAOPERATIVE CHOLANGIOGRAM;  Surgeon: Rolm Bookbinder, MD;  Location: [facility];  Service: General;  Laterality: N/A;  . ERCP N/A 09/18/2016   Procedure: ENDOSCOPIC RETROGRADE CHOLANGIOPANCREATOGRAPHY (ERCP);  Surgeon: Doran Stabler, MD;  Location: [facility];  Service: Endoscopy;  Laterality: N/A;  . FOOT SURGERY  patient reports four foot surgeries  . FOOT SURGERY Left    bunion, hammer toe surgery  . FOOT SURGERY Right    shaved bunion, hammer toe correction  . KNEE ARTHROSCOPY Right 1997  . REPLACEMENT TOTAL KNEE Left   . 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 RETINAL DETACHMENT SURGERY    . RETINAL DETACHMENT SURGERY Left 2008  . THYROIDECTOMY, PARTIAL Right   . THYROIDECTOMY, PARTIAL  2005  . TOTAL KNEE ARTHROPLASTY Right 11/13/2019   Procedure:  TOTAL KNEE ARTHROPLASTY SDDC;  Surgeon: Gaynelle Arabian, MD;  Location: [facility];  Service: Orthopedics;  Laterality: Right;  59min    Social History   Socioeconomic History  . Marital status: Widowed    Spouse name: Not on file  . Number of children: 3  . Years of education: Not on file  . Highest education level: Not on file  Occupational History  . Not on file  Tobacco Use  . Smoking status: Former Smoker    Quit date: 06/02/1979    Years since quitting: 40.8  . Smokeless tobacco: Never Used  Vaping Use  . Vaping Use: Never used  Substance and Sexual Activity  . Alcohol use: Yes    Alcohol/week: 1.0 standard drink    Types: 1 Glasses of wine per week    Comment: Occ  . Drug use: No  . Sexual activity: Not Currently  Other Topics Concern  . 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 Difficulty of Paying Living Expenses: Not on file  Food Insecurity:   . Worried About Charity fundraiser in the Last Year: Not on file  . Ran Out of Food in the Last Year: Not on file  Transportation Needs:   . Lack of Transportation (Medical): Not on file  . Lack of Transportation (Non-Medical): Not on file  Physical Activity:   . Days of Exercise per Week: Not on file  . Minutes of Exercise per Session: Not on file  Stress:   . Feeling of Stress : Not on file  Social Connections:   . Frequency of Communication with Friends and Family: Not on file  . Frequency of Social Gatherings with Friends and Family: Not on file  . Attends Religious Services: Not on file  . Active Member of Clubs or Organizations: Not on file  . Attends Archivist Meetings: Not on file  . Marital Status: Not on file    Intimate Partner Violence:   . Fear of Current or Ex-Partner: Not on file  . Emotionally Abused: Not on file  . Physically Abused: Not on file  . Sexually Abused: Not on file    Family History  Problem Relation Age of Onset  . Heart disease Mother   . Heart attack Mother   . Heart disease Father   . Diabetes Maternal Grandmother   . Cancer Paternal Grandfather        stomach    ROS: Knee arthralgias but no fevers or chills, productive cough, hemoptysis, dysphasia, odynophagia, melena, hematochezia, dysuria, hematuria, rash, seizure activity, orthopnea, PND, pedal edema, claudication. Remaining systems are negative.  Physical Exam:   Blood pressure (!) 160/92, pulse 66, height 5\' 1"  (1.549 m), weight 174 lb (78.9 kg), SpO2 94 %.  General:  Well developed/well nourished in NAD Skin warm/dry Patient not depressed No peripheral clubbing Back-normal HEENT-normal/normal eyelids Neck supple/normal carotid upstroke bilaterally; no bruits; no JVD; no thyromegaly chest - CTA/ normal expansion CV - RRR/normal S1 and S2; no murmurs, rubs or gallops;  PMI nondisplaced Abdomen -NT/ND, no HSM, no mass, + bowel sounds, no bruit 2+ femoral pulses, no bruits Ext-no edema, chords, 2+ DP Neuro-grossly nonfocal  ECG -January 11, 2020-sinus rhythm with anterior T wave inversion.  T wave changes similar to previous.  Personally reviewed  A/P  1 syncope-description sounds consistent with orthostatic syncope.  There may also be a vagal component with her episode in the bathroom.  We will arrange an echocardiogram to assess LV function.  I have asked her to maintain hydration and to increase sodium intake.  Can also consider compression hose in the future if needed.  Note some of her symptoms improved after discontinuing hydrochlorothiazide.  We can consider further evaluation if she has episodes despite above measures.  2 hypertension-patient's blood pressure is elevated today.  However she states  it is in the 120 range at home.  We will allow her blood pressure to run higher given probable orthostatic syncope.  She will follow at home and we will adjust regimen as needed.  Kirk Ruths, MD

## 2020-03-23 NOTE — Progress Notes (Signed)
Subjective: 79 year old female presents the office today due to painful calluses that she would have trimmed on both feet.  She also was interested in possibly right second toe rotation.  The first and third toes are abutting the second toe sits plantarflexed and she walks on the second toe causing discomfort.  She is wonder if this would help with her balance but it does hurt as well.  No other changes to her lower extremities today.  Objective: AAO x3, NAD Hyperkeratotic lesions present left foot metatarsal area as well as fifth metatarsal base.  Transfer to the callus on the plantar medial aspect of the second toe for the toe sits plantarflexed and she is walking on the digit.  Upon debridement there is no underlying ulcerations drainage or signs of infection. Significant bunion as well as digital contractures resulting in her second toe sitting plantarflexed underneath the first and third toes. No pain with calf compression, swelling, warmth, erythema  Assessment: Symptomatic callus formation bilaterally, neuropathy; digital deformity  Plan: -All treatment options discussed with the patient including all alternatives, risks, complications.  -Debrided hyperkeratotic lesions x3 without any complications or bleeding.  Continue offloading -Discussed possibly second toe rotation help with pain but she has several other procedures coming up that she is going to take care of first. -Continue Lyrica as prescribed.  -Patient encouraged to call the office with any questions, concerns, change in symptoms.   Trula Slade DPM

## 2020-03-27 ENCOUNTER — Encounter: Payer: Self-pay | Admitting: Cardiology

## 2020-03-27 ENCOUNTER — Other Ambulatory Visit: Payer: Self-pay

## 2020-03-27 ENCOUNTER — Ambulatory Visit (INDEPENDENT_AMBULATORY_CARE_PROVIDER_SITE_OTHER): Payer: Medicare Other | Admitting: Cardiology

## 2020-03-27 VITALS — BP 160/92 | HR 66 | Ht 61.0 in | Wt 174.0 lb

## 2020-03-27 DIAGNOSIS — I10 Essential (primary) hypertension: Secondary | ICD-10-CM

## 2020-03-27 DIAGNOSIS — R55 Syncope and collapse: Secondary | ICD-10-CM

## 2020-03-27 NOTE — Patient Instructions (Signed)

## 2020-03-28 ENCOUNTER — Telehealth: Payer: Self-pay | Admitting: Cardiology

## 2020-03-28 NOTE — Telephone Encounter (Signed)
Patient called to request clearance to have a tooth pulled, advised to have dentist office call to request clearance.

## 2020-04-01 DIAGNOSIS — L814 Other melanin hyperpigmentation: Secondary | ICD-10-CM | POA: Diagnosis not present

## 2020-04-01 DIAGNOSIS — L821 Other seborrheic keratosis: Secondary | ICD-10-CM | POA: Diagnosis not present

## 2020-04-01 DIAGNOSIS — L723 Sebaceous cyst: Secondary | ICD-10-CM | POA: Diagnosis not present

## 2020-04-01 DIAGNOSIS — Z85820 Personal history of malignant melanoma of skin: Secondary | ICD-10-CM | POA: Diagnosis not present

## 2020-04-01 DIAGNOSIS — L578 Other skin changes due to chronic exposure to nonionizing radiation: Secondary | ICD-10-CM | POA: Diagnosis not present

## 2020-04-01 DIAGNOSIS — L820 Inflamed seborrheic keratosis: Secondary | ICD-10-CM | POA: Diagnosis not present

## 2020-04-01 DIAGNOSIS — D225 Melanocytic nevi of trunk: Secondary | ICD-10-CM | POA: Diagnosis not present

## 2020-04-02 ENCOUNTER — Telehealth: Payer: Self-pay

## 2020-04-02 ENCOUNTER — Telehealth: Payer: Self-pay | Admitting: *Deleted

## 2020-04-02 NOTE — Telephone Encounter (Signed)
Patient denies other symptoms. Experiencing elevated reading. She is coming in tomorrow to see you for CPE.

## 2020-04-02 NOTE — Telephone Encounter (Signed)
   Primary Cardiologist: Kirk Ruths, MD  Chart reviewed as part of pre-operative protocol coverage.   Simple dental extractions are considered low risk procedures per guidelines and generally do not require any specific cardiac clearance. It is also generally accepted that for simple extractions and dental cleanings, there is no need to interrupt blood thinner therapy.  SBE prophylaxis is not required for the patient from a cardiac standpoint.  I will route this recommendation to the requesting party via Epic fax function and remove from pre-op pool.  Please call with questions.  [place], [place] 04/02/2020, 4:03 PM

## 2020-04-02 NOTE — Telephone Encounter (Signed)
   [facility] Pre-operative Risk Assessment    HEARTCARE STAFF: - Please ensure there is not already an duplicate clearance open for this procedure. - Under Visit Info/Reason for Call, type in Other and utilize the format Clearance MM/DD/YY or Clearance TBD. Do not use dashes or single digits. - If request is for dental extraction, please clarify the # of teeth to be extracted.  Request for surgical clearance:  1. What type of surgery is being performed? 1 TOOTH SURGICALLY REMOVED   2. When is this surgery scheduled? TBD   3. What type of clearance is required (medical clearance vs. Pharmacy clearance to hold med vs. Both)? MEDICAL  4. Are there any medications that need to be held prior to surgery and how long? NONE LISTED   5. Practice name and name of physician performing surgery? [facility]; DR. Berniece Pap   6. What is the office phone number? [phone]   7.   What is the office fax number? [phone]  8.   Anesthesia type (None, local, MAC, general) ? IV SEDATION   Margaret [name] 04/02/2020, 11:44 AM  _________________________________________________________________   (provider comments below)

## 2020-04-02 NOTE — Progress Notes (Addendum)
[facility] [place], [place], [place] [address] [phone]  Date:  04/03/2020   Name:  Margaret [name]   DOB:  1941-02-22   MRN:  [id number]  PCP:  Darreld Mclean, MD    Chief Complaint: Hypertension and Follow-up   History of Present Illness:  Margaret [name] is a 79 y.o. very pleasant female patient who presents with the following:  Patient today for follow-up visit/health maintenance Last seen by myself in August of this year- history of hypertension, GERD, osteopenia, melanoma of left foot, renal insufficiency She is here today with a friend and neighbor, Jesse Sans drove her here today  She underwent a knee replacement in June, very good result However, she also had a couple of presyncopal versus seizure episodes in her postoperative period She was referred to see neurology-appointment with Dr. Delice Lesch August 27-they ordered MRI brain and EEG evaluation Her 24-hour EEG was normal Brain MRI performed per tried imaging on 02/26/2020.  Result reviewed-mild age-related changes, otherwise normal  She also saw her cardiologist, Dr. Stanford Breed last week regarding possible syncope: 1 syncope-description sounds consistent with orthostatic syncope.  There may also be a vagal component with her episode in the bathroom.  We will arrange an echocardiogram to assess LV function.  I have asked her to maintain hydration and to increase sodium intake.  Can also consider compression hose in the future if needed.  Note some of her symptoms improved after discontinuing hydrochlorothiazide.  We can consider further evaluation if she has episodes despite above measures. 2 hypertension-patient's blood pressure is elevated today.  However she states it is in the 120 range at home.  We will allow her blood pressure to run higher given probable orthostatic syncope.  She will follow at home and we will adjust regimen as  needed.  Hepatitis C screening Flu vaccine- done  Mammogram scheduled for January Covid vaccine done, they plan to get booster today  CMP, CBC, TSH completed in August, stable-GFR 50 at that time  BP was 163/103 at home this am However ok now.  She has been checking her blood pressure at home, and getting persistently elevated readings.  Systolics range from 858I to 180.  We wonder if her home cuff may not be accurate.  Her friend Juliann Pulse has her own cuff, they will check it against the patient's cough when they get home today  Zaharah admits that she gets quite anxious and worried when her blood pressure is high Patient Active Problem List   Diagnosis Date Noted  . OA (osteoarthritis) of knee 11/13/2019  . Total knee replacement status, 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  . Encounter for postoperative examination after surgery for malignant neoplasm 09/30/2018  . Malignant melanoma of left foot ([place] [facility]) 08/15/2018  . Plantar fasciitis of right foot 02/03/2017  . Porokeratosis 02/03/2017  . Acute pancreatitis due to calculus of common bile duct 09/17/2016  . Acute gallstone pancreatitis   . Calculus of gallbladder with acute cholecystitis and obstruction   . LFT elevation   . Stress fracture 01/01/2016  . Insomnia 12/16/2015  . Peripheral neuropathy 10/18/2015  . Renal insufficiency 03/06/2015  . HTN (hypertension) 03/06/2015  . Osteopenia 03/06/2015  . GERD (gastroesophageal reflux disease) 03/06/2015  . Vaginal pessary present 03/06/2015    Past Medical History:  Diagnosis Date  . Anxiety   . Arthritis   . Cancer (HCC)    lt. foot melanoma  . Chronic kidney  disease    lt. kidney cyst  . History of healed stress fracture 2013   bilateral feet  . Hx of phlebitis    reports remote hx of "superfical blood clots" never anticoagulated  . Hypertension   . Osteopenia     Past Surgical History:  Procedure Laterality Date  . CHOLECYSTECTOMY N/A 09/19/2016   Procedure: LAPAROSCOPIC  CHOLECYSTECTOMY POSSIBLE INTRAOPERATIVE CHOLANGIOGRAM;  Surgeon: Rolm Bookbinder, MD;  Location: [facility];  Service: General;  Laterality: N/A;  . ERCP N/A 09/18/2016   Procedure: ENDOSCOPIC RETROGRADE CHOLANGIOPANCREATOGRAPHY (ERCP);  Surgeon: Doran Stabler, MD;  Location: [facility]COPY;  Service: Endoscopy;  Laterality: N/A;  . FOOT SURGERY     patient reports four foot surgeries  . FOOT SURGERY Left    bunion, hammer toe surgery  . FOOT SURGERY Right    shaved bunion, hammer toe correction  . KNEE ARTHROSCOPY Right 1997  . REPLACEMENT TOTAL KNEE Left   . 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 RETINAL DETACHMENT SURGERY    . RETINAL DETACHMENT SURGERY Left 2008  . THYROIDECTOMY, PARTIAL Right   . THYROIDECTOMY, PARTIAL  2005  . TOTAL KNEE ARTHROPLASTY Right 11/13/2019   Procedure: TOTAL KNEE ARTHROPLASTY SDDC;  Surgeon: Gaynelle Arabian, MD;  Location: [facility];  Service: Orthopedics;  Laterality: Right;  44min    Social History   Tobacco Use  . Smoking status: Former Smoker    Quit date: 06/02/1979    Years since quitting: 40.8  . Smokeless tobacco: Never Used  Vaping Use  . Vaping Use: Never used  Substance Use Topics  . Alcohol use: Yes    Alcohol/week: 1.0 standard drink    Types: 1 Glasses of wine per week    Comment: Occ  . Drug use: No    Family History  Problem Relation Age of Onset  . Heart disease Mother   . Heart attack Mother   . Heart disease Father   . Diabetes Maternal Grandmother   . Cancer Paternal Grandfather        stomach    Allergies  Allergen Reactions  . Keflex [Cephalexin] Diarrhea  . Percocet [Oxycodone-Acetaminophen] Other (See Comments)    Syncope (passed out)    Medication list has been reviewed and updated.  Current Outpatient Medications on File Prior to Visit  Medication Sig Dispense Refill  . acetaminophen (TYLENOL) 500 MG tablet Take 1,000 mg by mouth every 6 (six) hours as needed (pain.).     Marland Kitchen ketoconazole (NIZORAL) 2 % cream  Apply 1 application topically 2 (two) times daily as needed (skin irritation/rash.).     Marland Kitchen LORazepam (ATIVAN) 2 MG tablet TAKE 1 TO 1&1/2 TABLETS BY MOUTH EVERY NIGHT AT BEDTIME AS NEEDED FOR SLEEP. MAY TAKE 1 TABLET DURING DAY IF NEEDED 70 tablet 2  . metoprolol succinate (TOPROL-XL) 25 MG 24 hr tablet TAKE 1 TABLET(25 MG) BY MOUTH DAILY 90 tablet 1  . NONFORMULARY OR COMPOUNDED ITEM [facility]:  Peripheral Neuropathy - Bupivacaine 1%, Doxepin 3%, Gabapentin 6%, Pentoxifylline 3%, Topiramate 1%. Apply 1-2 grams to affected area 3-4 times daily. (Patient taking differently: Apply 1 application topically 4 (four) times daily as needed (neuropathy/pain (feet)). [facility]:  Peripheral Neuropathy - Bupivacaine 1%, Doxepin 3%, Gabapentin 6%, Pentoxifylline 3%, Topiramate 1%. Apply 1-2 grams to affected area 3-4 times daily.) 100 each 11  . omeprazole (PRILOSEC) 20 MG capsule TAKE 1 CAPSULE BY MOUTH EVERY DAY AS NEEDED (Patient taking differently: Take 20 mg by mouth daily. )  90 capsule 3  . pregabalin (LYRICA) 150 MG capsule Take 1 capsule (150 mg total) by mouth 2 (two) times daily. 60 capsule 2   No current facility-administered medications on file prior to visit.    Review of Systems:  As per HPI- otherwise negative.   Physical Examination: Vitals:   04/03/20 1110  BP: 124/80  Pulse: 74  Resp: 18  Temp: 98.6 F (37 C)  SpO2: 97%   Vitals:   04/03/20 1110  Weight: 169 lb (76.7 kg)  Height: 5\' 1"  (1.549 m)   Body mass index is 31.93 kg/m. Ideal Body Weight: Weight in (lb) to have BMI = 25: 132  GEN: no acute distress.  Mild overweight, looks well HEENT: Atraumatic, Normocephalic.  Ears and Nose: No external deformity. CV: RRR, No M/G/R. No JVD. No thrill. No extra heart sounds. PULM: CTA B, no wheezes, crackles, rhonchi. No retractions. No resp. distress. No accessory muscle use. ABD: S, NT, ND, +BS. No rebound. No HSM. EXTR: No c/c/e PSYCH: Normally  interactive. Conversant.    Assessment and Plan: Essential hypertension - Plan: hydrochlorothiazide (HYDRODIURIL) 12.5 MG tablet  Pre-diabetes - Plan: Hemoglobin A1c  Screening for hyperlipidemia - Plan: Lipid panel  Patient today for a follow-up visit Rayette has been getting some elevated blood pressure readings at home.  However, we are not certain if her cuff may be inaccurate.  They plan to check her cuff against her friend's cuff today, and will purchase a new cuff if needed.  If her home cuff is indeed accurate, then she is having some high blood pressure readings.  In this case, she is given a prescription for hydrochlorothiazide 12.5.  She was asked to take 1/2 tablet as needed for elevated blood pressure-does not need to take daily if not needed  A1c, lipids pending They plan for Covid booster She will have an echocardiogram soon per cardiology This visit occurred during the SARS-CoV-2 public health emergency.  Safety protocols were in place, including screening questions prior to the visit, additional usage of staff PPE, and extensive cleaning of exam room while observing appropriate contact time as indicated for disinfecting solutions.    Signed Lamar Blinks, MD  Addendum 11/4, received her labs as below.  Message to patient Results for orders placed or performed in visit on 04/03/20  Lipid panel  Result Value Ref Range   Cholesterol 216 (H) <200 mg/dL   HDL 75 > OR = 50 mg/dL   Triglycerides 115 <150 mg/dL   LDL Cholesterol (Calc) 118 (H) mg/dL (calc)   Total CHOL/HDL Ratio 2.9 <5.0 (calc)   Non-HDL Cholesterol (Calc) 141 (H) <130 mg/dL (calc)  Hemoglobin A1c  Result Value Ref Range   Hgb A1c MFr Bld 5.6 <5.7 % of total Hgb   Mean Plasma Glucose 114 (calc)   eAG (mmol/L) 6.3 (calc)

## 2020-04-02 NOTE — Patient Instructions (Addendum)
It was great to see you again today!    I will be in touch with your labs asap  Assuming all is well, we can plan to visit in about 6 months  If your BP is running higher than 145/90 at home ok to take 1/2 an HCTZ pill. You can do this as needed

## 2020-04-02 NOTE — Telephone Encounter (Signed)
Caller states her blood pressure is high 164/101. declined triage.  Telephone: [phone]  Pt does have an appt tomorrow w/ Dr. [name] for CPX

## 2020-04-02 NOTE — Telephone Encounter (Signed)
BP Readings from Last 3 Encounters:  03/27/20 (!) 160/92  02/15/20 137/84  01/26/20 (!) 156/89   Okay, she was seen recently by cardiology, permissive hypertension due to recent possible orthostatic syncope She has been on HCTZ in the past, could add this back-perhaps decreased to 6.25 mg However, on chart review I believe patient has capsules at home. Called and left message on machine. Okay to take 12.5 mg hydrochlorothiazide today, will see her tomorrow

## 2020-04-03 ENCOUNTER — Other Ambulatory Visit: Payer: Self-pay

## 2020-04-03 ENCOUNTER — Ambulatory Visit (INDEPENDENT_AMBULATORY_CARE_PROVIDER_SITE_OTHER): Payer: Medicare Other | Admitting: Family Medicine

## 2020-04-03 ENCOUNTER — Encounter: Payer: Self-pay | Admitting: Family Medicine

## 2020-04-03 VITALS — BP 124/80 | HR 74 | Temp 98.6°F | Resp 18 | Ht 61.0 in | Wt 169.0 lb

## 2020-04-03 DIAGNOSIS — R7303 Prediabetes: Secondary | ICD-10-CM | POA: Diagnosis not present

## 2020-04-03 DIAGNOSIS — Z13220 Encounter for screening for lipoid disorders: Secondary | ICD-10-CM | POA: Diagnosis not present

## 2020-04-03 DIAGNOSIS — I10 Essential (primary) hypertension: Secondary | ICD-10-CM | POA: Diagnosis not present

## 2020-04-03 MED ORDER — HYDROCHLOROTHIAZIDE 12.5 MG PO TABS: 6.2500 mg | ORAL_TABLET | Freq: Every day | ORAL | 6 refills | Status: DC

## 2020-04-04 ENCOUNTER — Encounter: Payer: Self-pay | Admitting: Family Medicine

## 2020-04-04 ENCOUNTER — Telehealth: Payer: Self-pay | Admitting: Family Medicine

## 2020-04-04 DIAGNOSIS — E785 Hyperlipidemia, unspecified: Secondary | ICD-10-CM

## 2020-04-04 LAB — LIPID PANEL
Cholesterol: 216 mg/dL — ABNORMAL HIGH (ref <200)
HDL: 75 mg/dL (ref >=50)
LDL Cholesterol (Calc): 118 mg/dL (calc) — ABNORMAL HIGH
Non-HDL Cholesterol (Calc): 141 mg/dL (calc) — ABNORMAL HIGH (ref <130)
Total CHOL/HDL Ratio: 2.9 (calc) (ref <5.0)
Triglycerides: 115 mg/dL (ref <150)

## 2020-04-04 LAB — HEMOGLOBIN A1C
Hgb A1c MFr Bld: 5.6 % of total Hgb (ref <5.7)
Mean Plasma Glucose: 114 (calc)
eAG (mmol/L): 6.3 (calc)

## 2020-04-04 MED ORDER — SULFAMETHOXAZOLE-TRIMETHOPRIM 800-160 MG PO TABS: 1.0000 | ORAL_TABLET | Freq: Two times a day (BID) | ORAL | 0 refills | Status: DC

## 2020-04-04 NOTE — Telephone Encounter (Signed)
Patient just had appointment yesterday. Schedule her another appointment or lab appointment?

## 2020-04-04 NOTE — Telephone Encounter (Signed)
Patient states she has a uti. She is urinating every hour. She is wondering if you send some medicine to pharmacy.  [id number] [address], [address] AT [facility] OF [address]  [address] [place] [address]  Phone:  [phone] Fax:  [phone]

## 2020-04-04 NOTE — Telephone Encounter (Signed)
Called pt back- she has noted urinary frequency since leaving my office yesterday No blood in her urine  She otherwise feels fine, no fever back pain  We will call in a prescription for Septra for her, twice a day for 5 days  She will let me know if not feeling better in the next couple of days-sooner if worse

## 2020-04-09 DIAGNOSIS — H25041 Posterior subcapsular polar age-related cataract, right eye: Secondary | ICD-10-CM | POA: Diagnosis not present

## 2020-04-09 DIAGNOSIS — H18413 Arcus senilis, bilateral: Secondary | ICD-10-CM | POA: Diagnosis not present

## 2020-04-09 DIAGNOSIS — H2511 Age-related nuclear cataract, right eye: Secondary | ICD-10-CM | POA: Diagnosis not present

## 2020-04-09 DIAGNOSIS — Z961 Presence of intraocular lens: Secondary | ICD-10-CM | POA: Diagnosis not present

## 2020-04-09 DIAGNOSIS — H25011 Cortical age-related cataract, right eye: Secondary | ICD-10-CM | POA: Diagnosis not present

## 2020-04-11 MED ORDER — PRAVASTATIN SODIUM 20 MG PO TABS: 20.0000 mg | ORAL_TABLET | Freq: Every day | ORAL | 3 refills | Status: DC

## 2020-04-16 ENCOUNTER — Other Ambulatory Visit: Payer: Self-pay

## 2020-04-16 ENCOUNTER — Ambulatory Visit (HOSPITAL_COMMUNITY): Payer: Medicare Other | Attending: Cardiology

## 2020-04-16 DIAGNOSIS — R55 Syncope and collapse: Secondary | ICD-10-CM

## 2020-04-16 LAB — ECHOCARDIOGRAM COMPLETE
Area-P 1/2: 2.76 cm2
P 1/2 time: 302 msec
S' Lateral: 2.7 cm

## 2020-04-18 ENCOUNTER — Telehealth: Payer: Self-pay | Admitting: Family Medicine

## 2020-04-18 DIAGNOSIS — K219 Gastro-esophageal reflux disease without esophagitis: Secondary | ICD-10-CM

## 2020-04-18 MED ORDER — OMEPRAZOLE 20 MG PO CPDR: 20.0000 mg | DELAYED_RELEASE_CAPSULE | Freq: Every day | ORAL | 3 refills | Status: DC

## 2020-04-18 NOTE — Telephone Encounter (Signed)
Medication: omeprazole (PRILOSEC) 20 MG capsule    Has the patient contacted their pharmacy? No. (If no, request that the patient contact the pharmacy for the refill.) (If yes, when and what did the pharmacy advise?)  Preferred Pharmacy (with phone number or street name): [id number] [address], [address] AT [address]  [place], [place] [address]  Phone:  [phone] Fax:  [phone]  [facility] #:  [id number]  Agent: Please be advised that RX refills may take up to 3 business days. We ask that you follow-up with your pharmacy.

## 2020-04-18 NOTE — Telephone Encounter (Signed)
Refilled patient's medications to pharmacy.

## 2020-04-22 ENCOUNTER — Ambulatory Visit (INDEPENDENT_AMBULATORY_CARE_PROVIDER_SITE_OTHER): Payer: Medicare Other | Admitting: Podiatry

## 2020-04-22 ENCOUNTER — Other Ambulatory Visit: Payer: Self-pay

## 2020-04-22 DIAGNOSIS — Q828 Other specified congenital malformations of skin: Secondary | ICD-10-CM

## 2020-04-22 DIAGNOSIS — G629 Polyneuropathy, unspecified: Secondary | ICD-10-CM

## 2020-04-29 ENCOUNTER — Ambulatory Visit (HOSPITAL_BASED_OUTPATIENT_CLINIC_OR_DEPARTMENT_OTHER): Payer: Medicare Other

## 2020-04-29 NOTE — Telephone Encounter (Signed)
error 

## 2020-04-29 NOTE — Progress Notes (Signed)
Subjective: 79 year old female presents the office today due to painful calluses that she would have trimmed on both feet.  Denies any open sores or new issues today.  No fevers, chills, nausea, vomiting.  No calf pain, chest pain or shortness of breath.  Objective: AAO x3, NAD Hyperkeratotic lesions present left foot metatarsal area as well as fifth metatarsal base as well as on the plantar medial aspect of the second toe for the toe sits plantarflexed and she is walking on the digit.  Upon debridement there is no underlying ulcerations drainage or signs of infection. Significant bunion as well as digital contractures resulting in her second toe sitting plantarflexed underneath the first and third toes. No pain with calf compression, swelling, warmth, erythema  Assessment: Symptomatic callus formation bilaterally, neuropathy; digital deformity  Plan: -All treatment options discussed with the patient including all alternatives, risks, complications.  -Debrided hyperkeratotic lesions x3 without any complications or bleeding.  Continue offloading -Continue Lyrica as prescribed.  -Patient encouraged to call the office with any questions, concerns, change in symptoms.   Trula Slade DPM

## 2020-05-13 DIAGNOSIS — H2511 Age-related nuclear cataract, right eye: Secondary | ICD-10-CM | POA: Diagnosis not present

## 2020-05-20 ENCOUNTER — Other Ambulatory Visit: Payer: Self-pay

## 2020-05-20 ENCOUNTER — Ambulatory Visit: Payer: Medicare Other | Admitting: Obstetrics & Gynecology

## 2020-05-20 ENCOUNTER — Ambulatory Visit (INDEPENDENT_AMBULATORY_CARE_PROVIDER_SITE_OTHER): Payer: Medicare Other | Admitting: Podiatry

## 2020-05-20 DIAGNOSIS — M79674 Pain in right toe(s): Secondary | ICD-10-CM | POA: Diagnosis not present

## 2020-05-20 DIAGNOSIS — G8929 Other chronic pain: Secondary | ICD-10-CM

## 2020-05-20 DIAGNOSIS — M79673 Pain in unspecified foot: Secondary | ICD-10-CM

## 2020-05-20 DIAGNOSIS — G629 Polyneuropathy, unspecified: Secondary | ICD-10-CM

## 2020-05-20 DIAGNOSIS — Q828 Other specified congenital malformations of skin: Secondary | ICD-10-CM | POA: Diagnosis not present

## 2020-05-20 DIAGNOSIS — M2011 Hallux valgus (acquired), right foot: Secondary | ICD-10-CM

## 2020-05-20 DIAGNOSIS — B351 Tinea unguium: Secondary | ICD-10-CM | POA: Diagnosis not present

## 2020-05-20 DIAGNOSIS — M79675 Pain in left toe(s): Secondary | ICD-10-CM

## 2020-05-23 NOTE — Progress Notes (Signed)
Subjective: 79 year old female presents the office today due to painful calluses that she would have trimmed on both feet.  This was referred by me.  She states the pain is causing pain with shoes.  Denies any open sores or new issues today.  No fevers, chills, nausea, vomiting.  No calf pain, chest pain or shortness of breath.  Objective: AAO x3, NAD Hyperkeratotic lesions present left foot metatarsal area as well as fifth metatarsal base as well as on the plantar medial aspect of the second toe for the toe sits plantarflexed and she is walking on the digit.  Upon debridement there is no underlying ulcerations drainage or signs of infection. Significant bunion as well as digital contractures resulting in her second toe sitting plantarflexed underneath the first and third toes. No pain with calf compression, swelling, warmth, erythema  Assessment: Symptomatic callus formation bilaterally, neuropathy; digital deformity  Plan: -All treatment options discussed with the patient including all alternatives, risks, complications.  -Debrided hyperkeratotic lesions x3 without any complications or bleeding.  Continue offloading -Continue Lyrica as prescribed.  -We discussed bunion surgery but after discussion she does not want to have to deal with the recovery.  Discussed offloading pads dispensed a gel offloading pad for her. -Patient encouraged to call the office with any questions, concerns, change in symptoms.   Trula Slade DPM

## 2020-05-30 ENCOUNTER — Encounter: Payer: Self-pay | Admitting: Family Medicine

## 2020-06-04 ENCOUNTER — Ambulatory Visit (HOSPITAL_BASED_OUTPATIENT_CLINIC_OR_DEPARTMENT_OTHER): Payer: Medicare Other

## 2020-06-10 ENCOUNTER — Ambulatory Visit: Payer: Medicare Other | Admitting: Obstetrics & Gynecology

## 2020-06-17 ENCOUNTER — Ambulatory Visit: Payer: Medicare Other | Admitting: Podiatry

## 2020-06-24 ENCOUNTER — Other Ambulatory Visit: Payer: Self-pay

## 2020-06-24 ENCOUNTER — Ambulatory Visit (INDEPENDENT_AMBULATORY_CARE_PROVIDER_SITE_OTHER): Payer: Medicare Other | Admitting: Podiatry

## 2020-06-24 DIAGNOSIS — M79673 Pain in unspecified foot: Secondary | ICD-10-CM | POA: Diagnosis not present

## 2020-06-24 DIAGNOSIS — G8929 Other chronic pain: Secondary | ICD-10-CM | POA: Diagnosis not present

## 2020-06-24 DIAGNOSIS — M779 Enthesopathy, unspecified: Secondary | ICD-10-CM

## 2020-06-24 DIAGNOSIS — M19079 Primary osteoarthritis, unspecified ankle and foot: Secondary | ICD-10-CM | POA: Diagnosis not present

## 2020-06-24 DIAGNOSIS — G629 Polyneuropathy, unspecified: Secondary | ICD-10-CM | POA: Diagnosis not present

## 2020-06-24 DIAGNOSIS — Q828 Other specified congenital malformations of skin: Secondary | ICD-10-CM

## 2020-06-24 MED ORDER — PREGABALIN 150 MG PO CAPS
150.0000 mg | ORAL_CAPSULE | Freq: Two times a day (BID) | ORAL | 2 refills | Status: DC
Start: 2020-06-24 — End: 2020-10-01

## 2020-06-26 NOTE — Progress Notes (Signed)
Subjective: 80 year old female presents the office today due to painful calluses that she would have trimmed on both feet. She denies any open sores. Denies any drainage or pus or any swelling. She has no new concerns today otherwise other than asking for prescription of Lyrica that she will have hand written to take to her new pharmacy. Patient gets pain in the top of her feet which is been a chronic issue. No fevers, chills, nausea, vomiting.  No calf pain, chest pain or shortness of breath.  Objective: AAO x3, NAD Hyperkeratotic lesions present left foot metatarsal area as well as fifth metatarsal base as well as on the plantar medial aspect of the second toe for the toe sits plantarflexed and she is walking on the digit.  Upon debridement there is no underlying ulcerations drainage or signs of infection. Significant bunion as well as digital contractures resulting in her second toe sitting plantarflexed underneath the first and third toes. Arthritic changes present midfoot bilaterally. There is no area pinpoint tenderness. Flexor, extensor tendons appear to be intact. No pain with calf compression, swelling, warmth, erythema  Assessment: Symptomatic callus formation bilaterally, neuropathy; digital deformity; capsulitis  Plan: -All treatment options discussed with the patient including all alternatives, risks, complications.  -Debrided hyperkeratotic lesions x3 without any complications or bleeding.  Continue offloading -Continue Lyrica as prescribed. New prescription provided. -Discussed Voltaren gel for the tops of her feet as well as shoes with good arch support and stiffer soled shoe. -Patient encouraged to call the office with any questions, concerns, change in symptoms.   Trula Slade DPM

## 2020-06-27 DIAGNOSIS — Z23 Encounter for immunization: Secondary | ICD-10-CM | POA: Diagnosis not present

## 2020-07-05 ENCOUNTER — Ambulatory Visit: Payer: Medicare Other | Admitting: Obstetrics & Gynecology

## 2020-07-08 ENCOUNTER — Ambulatory Visit (HOSPITAL_BASED_OUTPATIENT_CLINIC_OR_DEPARTMENT_OTHER): Payer: Medicare Other

## 2020-07-22 ENCOUNTER — Ambulatory Visit (INDEPENDENT_AMBULATORY_CARE_PROVIDER_SITE_OTHER): Payer: Medicare Other | Admitting: Podiatry

## 2020-07-22 ENCOUNTER — Ambulatory Visit: Payer: Medicare Other | Admitting: Podiatry

## 2020-07-22 ENCOUNTER — Other Ambulatory Visit: Payer: Self-pay

## 2020-07-22 DIAGNOSIS — G629 Polyneuropathy, unspecified: Secondary | ICD-10-CM | POA: Diagnosis not present

## 2020-07-22 DIAGNOSIS — Q828 Other specified congenital malformations of skin: Secondary | ICD-10-CM

## 2020-07-23 NOTE — Progress Notes (Signed)
Subjective: 80 year old female presents the office today due to painful calluses that she would have trimmed on both feet.  She states that she was up last night with having quite a bit of discomfort to her feet.  She is does get more discomfort at nighttime.  She is on Lyrica 75 mg 3 times a day.  Denies any new concerns otherwise.  No recent injury or falls.    Objective: AAO x3, NAD Hyperkeratotic lesions present left foot metatarsal area as well as fifth metatarsal base as well as on the plantar medial aspect of the second toe for the toe sits plantarflexed and she is walking on the digit.  Upon debridement there are no underlying ulcerations, drainage or signs of infection. Significant bunion as well as digital contractures resulting in her second toe sitting plantarflexed underneath the first and third toes. Arthritic changes present midfoot bilaterally. There is no area pinpoint tenderness. Flexor, extensor tendons appear to be intact. No pain with calf compression, swelling, warmth, erythema  Assessment: Symptomatic callus formation bilaterally, neuropathy; digital deformity; capsulitis  Plan: -All treatment options discussed with the patient including all alternatives, risks, complications.  -Debrided hyperkeratotic lesions x3 without any complications or bleeding.  Continue offloading -Continue Lyrica as prescribed. New prescription provided.  Will discuss with primary care physician about increasing to 150 twice daily -Discussed Voltaren gel for the tops of her feet as well as shoes with good arch support and stiffer soled shoe. -Patient encouraged to call the office with any questions, concerns, change in symptoms.   Trula Slade DPM

## 2020-07-30 ENCOUNTER — Telehealth: Payer: Self-pay | Admitting: Family Medicine

## 2020-07-30 MED ORDER — METOPROLOL SUCCINATE ER 25 MG PO TB24
25.0000 mg | ORAL_TABLET | Freq: Every day | ORAL | 1 refills | Status: DC
Start: 2020-07-30 — End: 2021-02-04

## 2020-07-30 NOTE — Telephone Encounter (Signed)
Medication: metoprolol succinate (TOPROL-XL) 25 MG 24 hr tablet [[id number]]    Has the patient contacted their pharmacy? No. (If no, request that the patient contact the pharmacy for the refill.) (If yes, when and what did the pharmacy advise?)  Preferred Pharmacy (with phone number or street name):[facility], [place]  Agent: Please be advised that RX refills may take up to 3 business days. We ask that you follow-up with your pharmacy.

## 2020-07-30 NOTE — Telephone Encounter (Signed)
Medications sent to pharmacy

## 2020-08-19 ENCOUNTER — Ambulatory Visit (HOSPITAL_BASED_OUTPATIENT_CLINIC_OR_DEPARTMENT_OTHER)
Admission: RE | Admit: 2020-08-19 | Discharge: 2020-08-19 | Disposition: A | Discharge status: Home / Self Care | Payer: Medicare Other | Source: Ambulatory Visit | Attending: Family Medicine | Admitting: Family Medicine

## 2020-08-19 ENCOUNTER — Other Ambulatory Visit: Payer: Self-pay

## 2020-08-19 ENCOUNTER — Encounter (HOSPITAL_BASED_OUTPATIENT_CLINIC_OR_DEPARTMENT_OTHER): Payer: Self-pay

## 2020-08-19 DIAGNOSIS — Z1231 Encounter for screening mammogram for malignant neoplasm of breast: Secondary | ICD-10-CM | POA: Diagnosis not present

## 2020-08-20 ENCOUNTER — Encounter: Payer: Self-pay | Admitting: Podiatrist

## 2020-08-20 ENCOUNTER — Ambulatory Visit (INDEPENDENT_AMBULATORY_CARE_PROVIDER_SITE_OTHER): Payer: Medicare Other | Admitting: Podiatrist

## 2020-08-20 DIAGNOSIS — Q828 Other specified congenital malformations of skin: Secondary | ICD-10-CM | POA: Diagnosis not present

## 2020-08-20 DIAGNOSIS — M216X2 Other acquired deformities of left foot: Secondary | ICD-10-CM

## 2020-08-20 DIAGNOSIS — M19079 Primary osteoarthritis, unspecified ankle and foot: Secondary | ICD-10-CM | POA: Diagnosis not present

## 2020-08-20 DIAGNOSIS — G629 Polyneuropathy, unspecified: Secondary | ICD-10-CM

## 2020-08-20 DIAGNOSIS — M2061 Acquired deformities of toe(s), unspecified, right foot: Secondary | ICD-10-CM

## 2020-08-20 DIAGNOSIS — M2040 Other hammer toe(s) (acquired), unspecified foot: Secondary | ICD-10-CM | POA: Diagnosis not present

## 2020-08-22 ENCOUNTER — Ambulatory Visit: Payer: Medicare Other | Admitting: Podiatry

## 2020-08-22 NOTE — Progress Notes (Signed)
Subjective: 80 year old female presents the office today due to painful calluses that she would have trimmed on both feet.  Denies any new concerns otherwise.  She states she has complete pain relief for 2 weeks, then the discomfort starts to return. She requires debridements every 4 weeks.  She relates she has neuropathy and at the moment, the lyrica is working [place] for her.     Objective: AAO x3, NAD Hyperkeratotic lesions present left foot 3rd metatarsal area as well as fifth metatarsal base.  Right foot callus present on the plantar medial aspect of the second toe since the toe sits plantarflexed and rotated and she is walking on the digit.    Upon debridement the lesions are deeply enucleated with a ground glass appearance. there are no underlying ulcerations, drainage or signs of infection. Significant bunion as well as digital contractures resulting in her second toe sitting plantarflexed underneath the first and third toes.- multiple past foot surgeries have been performed- 2 on the left and 1 on the right.  At this time not interested in pursuing additional surgery.   Arthritic changes present midfoot bilaterally. There is no area pinpoint tenderness. Flexor, extensor tendons appear to be intact. No pain with calf compression, no swelling, warmth, or  Erythema. No edema noted.    Assessment:   ICD-10-CM   1. 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  -Debrided hyperkeratotic lesions x3 without any complications or bleeding.  Continue offloading -Continue Lyrica as prescribed by Dr. [name] -Discussed continued use of a good supportive running/walking type shoe.  -Patient encouraged to call the office with any  questions, concerns, change in symptoms.  - she will return in 4 weeks for continued care.

## 2020-08-28 ENCOUNTER — Ambulatory Visit: Payer: Medicare Other | Admitting: Obstetrics & Gynecology

## 2020-08-28 ENCOUNTER — Other Ambulatory Visit: Payer: Self-pay | Admitting: Family Medicine

## 2020-08-28 DIAGNOSIS — F5102 Adjustment insomnia: Secondary | ICD-10-CM

## 2020-08-28 MED ORDER — LORAZEPAM 2 MG PO TABS: ORAL_TABLET | ORAL | 2 refills | Status: DC

## 2020-08-28 NOTE — Telephone Encounter (Signed)
Medication:LORazepam (ATIVAN) 2 MG tablet [[id number]]       Has the patient contacted their pharmacy? no (If no, request that the patient contact the pharmacy for the refill.) (If yes, when and what did the pharmacy advise?)    Preferred Pharmacy (with phone number or street name): [facility] [place], [place] Phone:  [phone]  Fax:  [phone]          Agent: Please be advised that RX refills may take up to 3 business days. We ask that you follow-up with your pharmacy.

## 2020-09-17 ENCOUNTER — Other Ambulatory Visit: Payer: Self-pay

## 2020-09-17 ENCOUNTER — Ambulatory Visit (INDEPENDENT_AMBULATORY_CARE_PROVIDER_SITE_OTHER): Payer: Medicare Other | Admitting: Podiatry

## 2020-09-17 DIAGNOSIS — M2040 Other hammer toe(s) (acquired), unspecified foot: Secondary | ICD-10-CM

## 2020-09-17 DIAGNOSIS — G629 Polyneuropathy, unspecified: Secondary | ICD-10-CM

## 2020-09-17 DIAGNOSIS — M2061 Acquired deformities of toe(s), unspecified, right foot: Secondary | ICD-10-CM

## 2020-09-17 DIAGNOSIS — Q828 Other specified congenital malformations of skin: Secondary | ICD-10-CM | POA: Diagnosis not present

## 2020-09-17 DIAGNOSIS — M779 Enthesopathy, unspecified: Secondary | ICD-10-CM | POA: Diagnosis not present

## 2020-09-20 ENCOUNTER — Telehealth: Payer: Self-pay | Admitting: Family Medicine

## 2020-09-20 DIAGNOSIS — I10 Essential (primary) hypertension: Secondary | ICD-10-CM

## 2020-09-20 MED ORDER — HYDROCHLOROTHIAZIDE 12.5 MG PO TABS: 6.2500 mg | ORAL_TABLET | Freq: Every day | ORAL | 5 refills | Status: DC

## 2020-09-20 NOTE — Telephone Encounter (Signed)
Medication: hydrochlorothiazide (HYDRODIURIL) 12.5 MG tablet [[id number]]       Has the patient contacted their pharmacy?  (If no, request that the patient contact the pharmacy for the refill.) (If yes, when and what did the pharmacy advise?)     Preferred Pharmacy (with phone number or street name):  [facility] at [place], [place] Phone:  [phone]  Fax:  [phone]          Agent: Please be advised that RX refills may take up to 3 business days. We ask that you follow-up with your pharmacy.

## 2020-09-22 NOTE — Progress Notes (Signed)
Subjective: 80 year old female presents the office today due to painful calluses that she would have trimmed on both feet.  She also states the bunion has been hurting her.  No recent injury or falls.  No other concerns today.  She states that the Lyrica does help and she admits that she has taken extra Lyrica.  She is asked for increasing dose.  She is currently on 150 mg twice a day.  Denies any new concerns otherwise.  No recent injury or falls.    Objective: AAO x3, NAD Hyperkeratotic lesions present left foot metatarsal area as well as fifth metatarsal base as well as on the plantar medial aspect of the second toe for the toe sits plantarflexed and she is walking on the digit.  Upon debridement there are no underlying ulcerations, drainage or signs of infection. Significant bunion as well as digital contractures resulting in her second toe sitting plantarflexed underneath the first and third toes. No pain with calf compression, swelling, warmth, erythema  Assessment: Symptomatic callus formation bilaterally, neuropathy; digital deformity  Plan: -All treatment options discussed with the patient including all alternatives, risks, complications.  -Debrided hyperkeratotic lesions x3 without any complications or bleeding.  Continue offloading -Continue Lyrica as prescribed.  Since she is having more symptoms in the afternoon and evening we discussed timing changes of the medication before we change the medication or dose. -Offered steroid injection for the bunion however we can hold off on this until next appointment -Patient encouraged to call the office with any questions, concerns, change in symptoms.   Trula Slade DPM

## 2020-09-23 ENCOUNTER — Encounter: Payer: Self-pay | Admitting: Obstetrics & Gynecology

## 2020-09-23 ENCOUNTER — Other Ambulatory Visit: Payer: Self-pay

## 2020-09-23 ENCOUNTER — Ambulatory Visit (INDEPENDENT_AMBULATORY_CARE_PROVIDER_SITE_OTHER): Payer: Medicare Other | Admitting: Obstetrics & Gynecology

## 2020-09-23 VITALS — BP 138/73 | HR 66 | Wt 175.0 lb

## 2020-09-23 DIAGNOSIS — Z1239 Encounter for other screening for malignant neoplasm of breast: Secondary | ICD-10-CM

## 2020-09-23 DIAGNOSIS — Z13820 Encounter for screening for osteoporosis: Secondary | ICD-10-CM | POA: Diagnosis not present

## 2020-09-23 DIAGNOSIS — M810 Age-related osteoporosis without current pathological fracture: Secondary | ICD-10-CM

## 2020-09-23 DIAGNOSIS — N8189 Other female genital prolapse: Secondary | ICD-10-CM | POA: Diagnosis not present

## 2020-09-23 DIAGNOSIS — Z01419 Encounter for gynecological examination (general) (routine) without abnormal findings: Secondary | ICD-10-CM

## 2020-09-23 NOTE — Progress Notes (Signed)
Subjective:     Margaret [name] is a 80 y.o. female here for a routine exam.[id number]. Current complaints: none. Pt recently turned 80 years old. She will have a 2nd party in [place]!  She is followed for pelvic organ prolapse. She reports that she does not like to have it changed but, she      Gynecologic History No LMP recorded. Patient is postmenopausal. Contraception: post menopausal status Last Pap: not indicated.    Last mammogram: 08/19/2020. Results were: normal  Obstetric History OB History  Gravida Para Term Preterm AB Living  4 3 3  0 1 3  SAB IAB Ectopic Multiple Live Births  1 0 0        # Outcome Date GA Lbr Len/2nd Weight Sex Delivery Anes PTL Lv  4 SAB           3 Term           2 Term           1 Term              The following portions of the patient's history were reviewed and updated as appropriate: allergies, current medications, past family history, past medical history, past social history, past surgical history and problem list.  Review of Systems Pertinent items are noted in HPI.    Objective:  BP 138/73   Pulse 66   Wt 175 lb (79.4 kg)   BMI 33.07 kg/m  General Appearance:    Alert, cooperative, no distress, appears stated age  Head:    Normocephalic, without obvious abnormality, atraumatic  Eyes:    conjunctiva/corneas clear, EOM's intact, both eyes  Ears:    Normal external ear canals, both ears  Nose:   Nares normal, septum midline, mucosa normal, no drainage    or sinus tenderness  Throat:   Lips, mucosa, and tongue normal; teeth and gums normal  Neck:   Supple, symmetrical, trachea midline, no adenopathy;    thyroid:  no enlargement/tenderness/nodules  Back:     Symmetric, no curvature, ROM normal, no CVA tenderness  Lungs:     respirations unlabored  Chest Wall:    No tenderness or deformity   Heart:    Regular rate and rhythm  Breast Exam:    No tenderness, masses, or nipple abnormality  Abdomen:     Soft, non-tender, bowel sounds active  all four quadrants,    no masses, no organomegaly  Genitalia:    Normal female without lesion, discharge or tenderness   GYN: Pessary removed and cleaned; vaginal mucosa looks healty and with no excoriations or breakdown. Pt still with uterine and vagina prolapse- incomplete.  The exam is unchanged from prior exams.   Extremities:   Extremities normal, atraumatic, no cyanosis or edema  Pulses:   2+ and symmetric all extremities  Skin:   Skin color, texture, turgor normal, no rashes or lesions     Assessment:    Healthy female exam.   Pelvic organ prolapse with pessary Needs a Dexa Scan. Last one was 2018.    Plan:    Follow up in: 3 months.    Dexa scan Confirm colon cancer screening.  Emmah Bratcher L. Harraway-Smith, M.D., Cherlynn June

## 2020-09-24 ENCOUNTER — Telehealth: Payer: Self-pay

## 2020-09-24 NOTE — Telephone Encounter (Signed)
-----   Message from Lavonia Drafts, MD sent at 09/23/2020  8:18 PM EDT ----- Please call pt She is due for a Dexa scan.  Please schedule.   Thank you,   Clh-S

## 2020-09-24 NOTE — Telephone Encounter (Signed)
Patient called and made aware that she will be contacted for her bone desity (DEXA) scan because Dr. Ihor Dow has place order.  Patient thinks she had colonoscpy within the last six years. Patient will check with primary to double check if needed. Kathrene Alu RN

## 2020-09-26 ENCOUNTER — Ambulatory Visit: Payer: Medicare Other | Admitting: Cardiology

## 2020-09-30 DIAGNOSIS — D2261 Melanocytic nevi of right upper limb, including shoulder: Secondary | ICD-10-CM | POA: Diagnosis not present

## 2020-09-30 DIAGNOSIS — D225 Melanocytic nevi of trunk: Secondary | ICD-10-CM | POA: Diagnosis not present

## 2020-09-30 DIAGNOSIS — L723 Sebaceous cyst: Secondary | ICD-10-CM | POA: Diagnosis not present

## 2020-09-30 DIAGNOSIS — Z85820 Personal history of malignant melanoma of skin: Secondary | ICD-10-CM | POA: Diagnosis not present

## 2020-09-30 DIAGNOSIS — L578 Other skin changes due to chronic exposure to nonionizing radiation: Secondary | ICD-10-CM | POA: Diagnosis not present

## 2020-09-30 DIAGNOSIS — D171 Benign lipomatous neoplasm of skin and subcutaneous tissue of trunk: Secondary | ICD-10-CM | POA: Diagnosis not present

## 2020-09-30 DIAGNOSIS — L814 Other melanin hyperpigmentation: Secondary | ICD-10-CM | POA: Diagnosis not present

## 2020-09-30 DIAGNOSIS — L821 Other seborrheic keratosis: Secondary | ICD-10-CM | POA: Diagnosis not present

## 2020-10-01 ENCOUNTER — Other Ambulatory Visit: Payer: Self-pay | Admitting: Podiatry

## 2020-10-01 NOTE — Telephone Encounter (Signed)
Please advise 

## 2020-10-08 ENCOUNTER — Other Ambulatory Visit: Payer: Self-pay

## 2020-10-08 ENCOUNTER — Ambulatory Visit (INDEPENDENT_AMBULATORY_CARE_PROVIDER_SITE_OTHER): Payer: Medicare Other | Admitting: Podiatry

## 2020-10-08 DIAGNOSIS — M2040 Other hammer toe(s) (acquired), unspecified foot: Secondary | ICD-10-CM

## 2020-10-08 DIAGNOSIS — G629 Polyneuropathy, unspecified: Secondary | ICD-10-CM | POA: Diagnosis not present

## 2020-10-08 DIAGNOSIS — Q828 Other specified congenital malformations of skin: Secondary | ICD-10-CM

## 2020-10-09 NOTE — Progress Notes (Signed)
Subjective: 80 year old female presents the office today due to painful calluses that she would have trimmed on both feet.  She has changed taking Lyrica 1 more later afternoon and 1 before bed which seems to be helping more.  Denies ulcerations or any changes.  She again brings up possibly amputating the second toe on the right foot as it sits plantarflexed compared to the first and third toes.   Objective: AAO x3, NAD Hyperkeratotic lesions present left foot metatarsal area as well as fifth metatarsal base as well as on the plantar medial aspect of the second toe for the toe sits plantarflexed and she is walking on the digit.  Upon debridement there are no underlying ulcerations, drainage or signs of infection. Significant bunion as well as digital contractures resulting in her second toe sitting plantarflexed underneath the first and third toes. No pain with calf compression, swelling, warmth, erythema  Assessment: Symptomatic callus formation bilaterally, neuropathy; digital deformity  Plan: -All treatment options discussed with the patient including all alternatives, risks, complications.  -Debrided hyperkeratotic lesions x3 without any complications or bleeding.  Continue offloading -Continue Lyrica. -Steroid injection of the bunion next appointment for she goes on her trip to [place] -Discussed amputation of the second toe but will we consider this after her vacation.  Trula Slade DPM

## 2020-10-23 DIAGNOSIS — Z96651 Presence of right artificial knee joint: Secondary | ICD-10-CM | POA: Diagnosis not present

## 2020-10-24 ENCOUNTER — Other Ambulatory Visit: Payer: Self-pay

## 2020-10-24 ENCOUNTER — Ambulatory Visit (HOSPITAL_BASED_OUTPATIENT_CLINIC_OR_DEPARTMENT_OTHER)
Admission: RE | Admit: 2020-10-24 | Discharge: 2020-10-24 | Disposition: A | Discharge status: Home / Self Care | Payer: Medicare Other | Source: Ambulatory Visit | Attending: Obstetrics & Gynecology | Admitting: Obstetrics & Gynecology

## 2020-10-24 DIAGNOSIS — Z780 Asymptomatic menopausal state: Secondary | ICD-10-CM | POA: Diagnosis not present

## 2020-10-24 DIAGNOSIS — M810 Age-related osteoporosis without current pathological fracture: Secondary | ICD-10-CM | POA: Insufficient documentation

## 2020-10-24 DIAGNOSIS — Z13820 Encounter for screening for osteoporosis: Secondary | ICD-10-CM | POA: Diagnosis not present

## 2020-10-25 ENCOUNTER — Ambulatory Visit: Payer: Medicare Other | Admitting: Neurology

## 2020-10-29 ENCOUNTER — Ambulatory Visit (INDEPENDENT_AMBULATORY_CARE_PROVIDER_SITE_OTHER): Payer: Medicare Other | Admitting: Podiatry

## 2020-10-29 ENCOUNTER — Other Ambulatory Visit: Payer: Self-pay

## 2020-10-29 DIAGNOSIS — M7751 Other enthesopathy of right foot: Secondary | ICD-10-CM | POA: Diagnosis not present

## 2020-10-29 DIAGNOSIS — M2040 Other hammer toe(s) (acquired), unspecified foot: Secondary | ICD-10-CM

## 2020-10-29 DIAGNOSIS — M21619 Bunion of unspecified foot: Secondary | ICD-10-CM

## 2020-10-30 NOTE — Progress Notes (Signed)
Subjective: 80 year old female presents the office today requesting injection for the bunions having her right foot.  She is needing to go to [place] and she wants to be more comfortable.  She is continue with Lyrica for the neuropathy which has been helping.  Calluses are starting to cause discomfort.  Denies any open lesions.   Objective: AAO x3, NAD But he has been on the right foot with tenderness to palpation on the medial aspect of the metatarsal head.  No pain with MPJ range of motion.  There is no edema, erythema. Hyperkeratotic lesions present left foot metatarsal area as well as fifth metatarsal base as well as on the plantar medial aspect of the second toe for the toe sits plantarflexed and she is walking on the digit.  Upon debridement there are no underlying ulcerations, drainage or signs of infection. Significant bunion as well as digital contractures resulting in her second toe sitting plantarflexed underneath the first and third toes. No pain with calf compression, swelling, warmth, erythema  Assessment: Bunion, capsulitis right foot, symptomatic callus formation bilaterally, neuropathy; digital deformity  Plan: -All treatment options discussed with the patient including all alternatives, risks, complications.  -Steroid injection performed to the bunion today.  Skin was prepped with Betadine and a mixture of 0.5 cc of dexamethasone phosphate, 0.5 cc of Marcaine plain was infiltrated into the bunion area complications.  Postinjection care discussed.  Dispensed bunion offloading pad. -Debrided hyperkeratotic lesions x3 without any complications or bleeding.  Continue offloading -Continue Lyrica.  Trula Slade DPM

## 2020-11-18 ENCOUNTER — Ambulatory Visit (INDEPENDENT_AMBULATORY_CARE_PROVIDER_SITE_OTHER): Payer: Medicare Other | Admitting: Podiatry

## 2020-11-18 ENCOUNTER — Other Ambulatory Visit: Payer: Self-pay

## 2020-11-18 ENCOUNTER — Encounter: Payer: Self-pay | Admitting: Podiatry

## 2020-11-18 DIAGNOSIS — I890 Lymphedema, not elsewhere classified: Secondary | ICD-10-CM | POA: Insufficient documentation

## 2020-11-18 DIAGNOSIS — M2061 Acquired deformities of toe(s), unspecified, right foot: Secondary | ICD-10-CM | POA: Diagnosis not present

## 2020-11-18 DIAGNOSIS — L723 Sebaceous cyst: Secondary | ICD-10-CM | POA: Insufficient documentation

## 2020-11-18 DIAGNOSIS — G629 Polyneuropathy, unspecified: Secondary | ICD-10-CM | POA: Diagnosis not present

## 2020-11-18 DIAGNOSIS — Z85820 Personal history of malignant melanoma of skin: Secondary | ICD-10-CM | POA: Insufficient documentation

## 2020-11-18 DIAGNOSIS — M21619 Bunion of unspecified foot: Secondary | ICD-10-CM | POA: Diagnosis not present

## 2020-11-18 DIAGNOSIS — M2040 Other hammer toe(s) (acquired), unspecified foot: Secondary | ICD-10-CM | POA: Diagnosis not present

## 2020-11-18 DIAGNOSIS — L700 Acne vulgaris: Secondary | ICD-10-CM | POA: Insufficient documentation

## 2020-11-18 DIAGNOSIS — Q828 Other specified congenital malformations of skin: Secondary | ICD-10-CM | POA: Diagnosis not present

## 2020-11-18 DIAGNOSIS — D2261 Melanocytic nevi of right upper limb, including shoulder: Secondary | ICD-10-CM | POA: Insufficient documentation

## 2020-11-18 DIAGNOSIS — D171 Benign lipomatous neoplasm of skin and subcutaneous tissue of trunk: Secondary | ICD-10-CM | POA: Insufficient documentation

## 2020-11-18 DIAGNOSIS — L219 Seborrheic dermatitis, unspecified: Secondary | ICD-10-CM | POA: Insufficient documentation

## 2020-11-22 NOTE — Progress Notes (Signed)
Subjective: 80 year old female presents the office today for concerns of pain to her right second toe and she is interested in having amputation of the toe at the toe is causing pain.  Also the calluses are starting to cause discomfort again.  She recently went on her vacation she had no pain to her foot other than the right second toe which causes irritation.  She has to bandage the toe daily.   Objective: AAO x3, NAD Continue present right foot without any pain.  The first and third digits are abutting the second toe with sitting plantar to the first and third toes resulting hyperkeratotic lesion.  There is no ulcerations. Hyperkeratotic lesions present left foot metatarsal area as well as fifth metatarsal base as well as on the plantar medial aspect of the second toe for the toe sits plantarflexed and she is walking on the digit.  Upon debridement there are no underlying ulcerations, drainage or signs of infection. No pain with calf compression, swelling, warmth, erythema  Assessment: Symptomatic callus formation bilaterally, digital deformity  Plan: -All treatment options discussed with the patient including all alternatives, risks, complications.  -We discussed treatment for the second toe.  This point discussed amputation of the digit.  We discussed the pros and cons of doing this as well as the postoperative course and alternatives, risks and complications.  She is can consider having this done towards the end of the summer. -Bunions doing well.  Continue offloading -Sharply debrided hyperkeratotic lesions x3 without any complications or bleeding as a courtesy -Lyrica for neuropathy  Trula Slade DPM

## 2020-11-26 ENCOUNTER — Telehealth: Payer: Self-pay

## 2020-11-26 NOTE — Telephone Encounter (Signed)
-----   Message from Lavonia Drafts, MD sent at 11/20/2020 10:11 AM EDT ----- Please call pt. She has osteoporosis. I thought she was on Fosamax but, I don't see it. She should f/u with her primary care provider for further management of her osteoporosis.   Thanks,   Clh-S

## 2020-11-26 NOTE — Telephone Encounter (Signed)
Called pt to discuss results. Pt made aware that she has as Osteoperosis. Pt states she does not want to take Fosamax because she was on it for fifteen years and she took herself off of it. Pt states she will f/u with her PCP.  Doyt Castellana l Ahlijah Raia, CMA

## 2020-11-27 ENCOUNTER — Telehealth: Payer: Self-pay

## 2020-11-27 NOTE — Telephone Encounter (Signed)
Patient called stated she has completed her DEXA at her gyn office. They have advised her to start fosamax due to diagnosis of osteoporosis. She states she has taken fosamax in the past for 15 years. She declines taking this medication. I ddi advise her we could discuss alternate medications. She declined any medication at this time.   FYI.

## 2020-12-16 ENCOUNTER — Ambulatory Visit: Payer: Medicare Other | Admitting: Podiatry

## 2020-12-17 ENCOUNTER — Other Ambulatory Visit: Payer: Self-pay

## 2020-12-17 ENCOUNTER — Ambulatory Visit (INDEPENDENT_AMBULATORY_CARE_PROVIDER_SITE_OTHER): Payer: Medicare Other | Admitting: Podiatry

## 2020-12-17 ENCOUNTER — Encounter: Payer: Self-pay | Admitting: Podiatry

## 2020-12-17 DIAGNOSIS — M79671 Pain in right foot: Secondary | ICD-10-CM | POA: Diagnosis not present

## 2020-12-17 DIAGNOSIS — M2040 Other hammer toe(s) (acquired), unspecified foot: Secondary | ICD-10-CM | POA: Diagnosis not present

## 2020-12-17 DIAGNOSIS — Q828 Other specified congenital malformations of skin: Secondary | ICD-10-CM | POA: Diagnosis not present

## 2020-12-17 DIAGNOSIS — G629 Polyneuropathy, unspecified: Secondary | ICD-10-CM | POA: Diagnosis not present

## 2020-12-17 MED ORDER — PREGABALIN 150 MG PO CAPS: 150.0000 mg | ORAL_CAPSULE | Freq: Two times a day (BID) | ORAL | 2 refills | Status: DC

## 2020-12-20 DIAGNOSIS — L821 Other seborrheic keratosis: Secondary | ICD-10-CM | POA: Diagnosis not present

## 2020-12-20 DIAGNOSIS — T148XXA Other injury of unspecified body region, initial encounter: Secondary | ICD-10-CM | POA: Diagnosis not present

## 2020-12-20 DIAGNOSIS — D485 Neoplasm of uncertain behavior of skin: Secondary | ICD-10-CM | POA: Diagnosis not present

## 2020-12-21 NOTE — Progress Notes (Signed)
Subjective: 80 year old female presents the office today for concerns of pain to her right second toe and she is interested in having amputation of the toe at the toe is causing pain.  She would like to have this done possibly in October.  Also the calluses started become uncomfortable and asking for them to be trimmed.    Objective: AAO x3, NAD The first and third digits are abutting the second toe with sitting plantar to the first and third toes resulting hyperkeratotic lesion.  There is no ulcerations.  This is throughout for balance causing balance issues as well as pain. Hyperkeratotic lesions present left foot metatarsal area as well as fifth metatarsal base as well as on the plantar medial aspect of the second toe for the toe sits plantarflexed and she is walking on the digit.  Upon debridement there are no underlying ulcerations, drainage or signs of infection. No pain with calf compression, swelling, warmth, erythema  Assessment: Symptomatic callus formation bilaterally, digital deformity  Plan: -All treatment options discussed with the patient including all alternatives, risks, complications.  -We discussed treatment for the second toe.  This point discussed amputation of the digit.  We discussed the pros and cons of doing this as well as the postoperative course and alternatives, risks and complications.  Likely have this done in October but she still think about this.  We will tentatively plan for October 5. -Bunions doing well.  Continue offloading -Sharply debrided hyperkeratotic lesions x3 without any complications or bleeding as a courtesy -Lyrica for neuropathy, refilled  Trula Slade DPM

## 2020-12-23 ENCOUNTER — Ambulatory Visit: Payer: Medicare Other | Admitting: Obstetrics & Gynecology

## 2021-01-06 DIAGNOSIS — S21202D Unspecified open wound of left back wall of thorax without penetration into thoracic cavity, subsequent encounter: Secondary | ICD-10-CM | POA: Diagnosis not present

## 2021-01-14 NOTE — Progress Notes (Signed)
HPI: FU syncope. Previously seen by neurology and felt to have a seizure versus syncope.  EEG and ambulatory EEG normal.  MRI with no significant abnormality.  At time of previous evaluation in October 2021 we felt that these were likely orthostatic mediated with component of increased vagal tone.  Her hydrochlorothiazide was discontinued and we asked her to increase hydration and sodium intake.  We also discussed compression hose.  Echocardiogram November 2021 showed normal LV function, grade 1 diastolic dysfunction, moderate left atrial enlargement, mild mitral regurgitation, trace aortic insufficiency.  Since last seen she denies dyspnea, chest pain, palpitations, recurrent syncope.  Current Outpatient Medications  Medication Sig Dispense Refill   acetaminophen (TYLENOL) 500 MG tablet Take 1,000 mg by mouth every 6 (six) hours as needed (pain.).      Bromfenac Sodium (PROLENSA) 0.07 % SOLN Prolensa 0.07 % eye drops  INSTILL 1 DROP IN RIGHT EYE EVERY EVENING AS DIRECTED     gabapentin (NEURONTIN) 400 MG capsule 1 capsule     gatifloxacin (ZYMAXID) 0.5 % SOLN gatifloxacin 0.5 % eye drops  INSTILL 1 DROP IN RIGHT EYE FOUR TIMES DAILY AS DIRECTED     hydrochlorothiazide (HYDRODIURIL) 12.5 MG tablet Take 0.5 tablets (6.25 mg total) by mouth daily. Use as needed for elevated BP 30 tablet 5   ketoconazole (NIZORAL) 2 % cream Apply 1 application topically 2 (two) times daily as needed (skin irritation/rash.).     LORazepam (ATIVAN) 0.5 MG tablet 1 tablet at bedtime as needed     LORazepam (ATIVAN) 2 MG tablet TAKE 1 TO 1&1/2 TABLETS BY MOUTH EVERY NIGHT AT BEDTIME AS NEEDED FOR SLEEP. MAY TAKE 1 TABLET DURING DAY IF NEEDED 70 tablet 2   metoprolol succinate (TOPROL-XL) 25 MG 24 hr tablet Take 1 tablet (25 mg total) by mouth daily. 90 tablet 1   NONFORMULARY OR COMPOUNDED ITEM [facility]:  Peripheral Neuropathy - Bupivacaine 1%, Doxepin 3%, Gabapentin 6%, Pentoxifylline 3%, Topiramate 1%.  Apply 1-2 grams to affected area 3-4 times daily. (Patient taking differently: Apply 1 application topically 4 (four) times daily as needed (neuropathy/pain (feet)). [facility]:  Peripheral Neuropathy - Bupivacaine 1%, Doxepin 3%, Gabapentin 6%, Pentoxifylline 3%, Topiramate 1%. Apply 1-2 grams to affected area 3-4 times daily.) 100 each 11   omeprazole (PRILOSEC) 20 MG capsule Take 1 capsule (20 mg total) by mouth daily. 90 capsule 3   ondansetron (ZOFRAN-ODT) 4 MG disintegrating tablet ondansetron 4 mg disintegrating tablet  DISSOLVE ONE TABLET ON TONGUE EVERY 8 HOURS IF NEEDED FOR NAUSEA AND VOMITING     pravastatin (PRAVACHOL) 20 MG tablet Take 1 tablet (20 mg total) by mouth daily. 90 tablet 3   prednisoLONE acetate (PRED FORTE) 1 % ophthalmic suspension prednisolone acetate 1 % eye drops,suspension  SHAKE LIQUID AND INSTILL 1 DROP IN RIGHT EYE FOUR TIMES DAILY AS DIRECTED     pregabalin (LYRICA) 150 MG capsule Take 1 capsule (150 mg total) by mouth 2 (two) times daily. 60 capsule 2   sulfamethoxazole-trimethoprim (BACTRIM DS) 800-160 MG tablet Take 1 tablet by mouth 2 (two) times daily. 10 tablet 0   traMADol (ULTRAM) 50 MG tablet tramadol 50 mg tablet     triamterene-hydrochlorothiazide (MAXZIDE-25) 37.5-25 MG tablet TK 1 T PO D     No current facility-administered medications for this visit.     Past Medical History:  Diagnosis Date   Anxiety    Arthritis    Cancer ([place])    lt. foot  melanoma   Chronic kidney disease    lt. kidney cyst   History of healed stress fracture 2013   bilateral feet   Hx of phlebitis    reports remote hx of "superfical blood clots" never anticoagulated   Hypertension    Osteopenia     Past Surgical History:  Procedure Laterality Date   CHOLECYSTECTOMY N/A 09/19/2016   Procedure: LAPAROSCOPIC CHOLECYSTECTOMY POSSIBLE INTRAOPERATIVE CHOLANGIOGRAM;  Surgeon: Rolm Bookbinder, MD;  Location: [facility];  Service: General;  Laterality: N/A;   ERCP  N/A 09/18/2016   Procedure: ENDOSCOPIC RETROGRADE CHOLANGIOPANCREATOGRAPHY (ERCP);  Surgeon: Doran Stabler, MD;  Location: [facility] END[facility]COPY;  Service: Endoscopy;  Laterality: N/A;   FOOT SURGERY     patient reports four foot surgeries   FOOT SURGERY Left    bunion, hammer toe surgery   FOOT SURGERY Right    shaved bunion, hammer toe correction   KNEE ARTHROSCOPY Right 1997   REPLACEMENT TOTAL KNEE Left    REPLACEMENT TOTAL KNEE Left 10/2012   RETINAL DETACHMENT SURGERY     RETINAL DETACHMENT SURGERY Left 2008   THYROIDECTOMY, PARTIAL Right    THYROIDECTOMY, PARTIAL  2005   TOTAL KNEE ARTHROPLASTY Right 11/13/2019   Procedure: TOTAL KNEE ARTHROPLASTY SDDC;  Surgeon: Gaynelle Arabian, MD;  Location: [facility];  Service: Orthopedics;  Laterality: Right;  80mn    Social History   Socioeconomic History   Marital status: Widowed    Spouse name: Not on file   Number of children: 3   Years of education: Not on file   Highest education level: Not on file  Occupational History   Not on file  Tobacco Use   Smoking status: Former    Types: Cigarettes    Quit date: 06/02/1979    Years since quitting: 41.6   Smokeless tobacco: Never  Vaping Use   Vaping Use: Never used  Substance and Sexual Activity   Alcohol use: Yes    Alcohol/week: 1.0 standard drink    Types: 1 Glasses of wine per week    Comment: Occ   Drug use: No   Sexual activity: Not Currently  Other Topics Concern   Not on file  Social History Narrative   ** Merged History Encounter **       Married   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   [place] son [place], [place]  7 grandchildren   1 great grandson    Left Handed   Social Determinants of Health   Financial Resource Strain: Not on file  Food Insecurity: Not on file  Transportation Needs: Not on file  Physical Activity: Not on file  Stress: Not on file  Social Connections: Not on file  Intimate Partner  Violence: Not on file    Family History  Problem Relation Age of Onset   Heart disease Mother    Heart attack Mother    Heart disease Father    Diabetes Maternal Grandmother    Cancer Paternal Grandfather        stomach    ROS: no fevers or chills, productive cough, hemoptysis, dysphasia, odynophagia, melena, hematochezia, dysuria, hematuria, rash, seizure activity, orthopnea, PND, pedal edema, claudication. Remaining systems are negative.  Physical Exam: Well-developed well-nourished in no acute distress.  Skin is warm and dry.  HEENT is normal.  Neck is supple.  Chest is clear to auscultation with normal expansion.  Cardiovascular exam is regular rate and rhythm.  Abdominal exam nontender or distended. No masses palpated. Extremities show no edema. neuro grossly intact  ECG-normal sinus rhythm at a rate of 62, left axis deviation, anterior T wave inversion.  Unchanged compared to January 11, 2020.  Personally reviewed  A/P  1 syncope-echocardiogram previously showed normal LV function.  Her symptoms have improved with increased sodium and fluid intake.  We will continue to follow.  2 hypertension-blood pressure mildly elevated.  We will follow and advance regimen as needed.  Note with orthostatic symptoms I would like to let her blood pressure run slightly higher.  Kirk Ruths, MD

## 2021-01-20 ENCOUNTER — Encounter: Payer: Self-pay | Admitting: Podiatry

## 2021-01-20 ENCOUNTER — Ambulatory Visit (INDEPENDENT_AMBULATORY_CARE_PROVIDER_SITE_OTHER): Payer: Medicare Other | Admitting: Podiatry

## 2021-01-20 ENCOUNTER — Other Ambulatory Visit: Payer: Self-pay

## 2021-01-20 ENCOUNTER — Ambulatory Visit (INDEPENDENT_AMBULATORY_CARE_PROVIDER_SITE_OTHER): Payer: Medicare Other

## 2021-01-20 DIAGNOSIS — M7750 Other enthesopathy of unspecified foot: Secondary | ICD-10-CM

## 2021-01-20 DIAGNOSIS — M79673 Pain in unspecified foot: Secondary | ICD-10-CM | POA: Diagnosis not present

## 2021-01-20 DIAGNOSIS — G8929 Other chronic pain: Secondary | ICD-10-CM | POA: Diagnosis not present

## 2021-01-20 DIAGNOSIS — M84375A Stress fracture, left foot, initial encounter for fracture: Secondary | ICD-10-CM | POA: Diagnosis not present

## 2021-01-20 DIAGNOSIS — M2040 Other hammer toe(s) (acquired), unspecified foot: Secondary | ICD-10-CM | POA: Diagnosis not present

## 2021-01-20 DIAGNOSIS — M7752 Other enthesopathy of left foot: Secondary | ICD-10-CM | POA: Diagnosis not present

## 2021-01-20 DIAGNOSIS — Q828 Other specified congenital malformations of skin: Secondary | ICD-10-CM | POA: Diagnosis not present

## 2021-01-20 DIAGNOSIS — S21202A Unspecified open wound of left back wall of thorax without penetration into thoracic cavity, initial encounter: Secondary | ICD-10-CM | POA: Insufficient documentation

## 2021-01-23 ENCOUNTER — Other Ambulatory Visit: Payer: Self-pay

## 2021-01-23 ENCOUNTER — Encounter: Payer: Self-pay | Admitting: Cardiology

## 2021-01-23 ENCOUNTER — Ambulatory Visit (INDEPENDENT_AMBULATORY_CARE_PROVIDER_SITE_OTHER): Payer: Medicare Other | Admitting: Cardiology

## 2021-01-23 VITALS — BP 148/82 | HR 62 | Ht 61.0 in | Wt 175.8 lb

## 2021-01-23 DIAGNOSIS — R55 Syncope and collapse: Secondary | ICD-10-CM

## 2021-01-23 DIAGNOSIS — I10 Essential (primary) hypertension: Secondary | ICD-10-CM | POA: Diagnosis not present

## 2021-01-23 NOTE — Patient Instructions (Signed)

## 2021-01-24 ENCOUNTER — Telehealth: Payer: Self-pay | Admitting: Cardiology

## 2021-01-24 NOTE — Telephone Encounter (Signed)
Returned to call patient who states that she was told that she could get her HCTZ in 6.'25mg'$  tablet. Advised patient that HCTZ does not come in that dosage and she will have to continue cutting the 12.'5mg'$  tablet in half. Patient verbalized understanding.

## 2021-01-24 NOTE — Telephone Encounter (Signed)
Pt c/o medication issue:  1. Name of Medication: hydrochlorothiazide (HYDRODIURIL) 12.5 MG tablet  2. How are you currently taking this medication (dosage and times per day)? .5 by mouth day  3. Are you having a reaction (difficulty breathing--STAT)? no  4. What is your medication issue? Patient thought dr Stanford Breed was going to write a new prescription for her so that she doesn't have to cut the pill in half. Please advise

## 2021-01-27 NOTE — Progress Notes (Signed)
Subjective: 80 year old female presents the office today for concerns of pain to her right second toe as well as she thinks that she has a stress fracture on her left foot.  She still wants to consider surgery in the right second toe to have it amputated as it sits underneath her first and third toes she walks on the toe causing it to her as well as balance issues.  She is also started noticing discomfort in the top of her left foot which is concerned of stress fractures that she was having previously.  Also asking for the calluses to be trimmed as they are causing discomfort as well.  Objective: AAO x3, NAD The first and third digits are abutting the second toe with sitting plantar to the first and third toes resulting hyperkeratotic lesion.  There is no ulcerations.  This is throughout for balance causing balance issues as well as pain. Hyperkeratotic lesions present left foot submetatarsal area as well as fifth metatarsal base as well as on the plantar medial aspect of the second toe for the toe sits plantarflexed and she is walking on the digit.  Upon debridement there are no underlying ulcerations, drainage or signs of infection. There is tenderness palpation on dorsal aspect of foot mostly first metatarsal.  Noticed increasing edema and there is no erythema or warmth.  Flexor, extensor tendons appear to be intact. No pain with calf compression, swelling, warmth, erythema  Assessment: Symptomatic callus formation bilaterally, digital deformity; concern for stress fracture left foot  Plan: -All treatment options discussed with the patient including all alternatives, risks, complications.  -We discussed treatment for the second toe.  Again discussed amputation of the digit with as well as pros and cons of doing this.  She is tentatively scheduled for October.  She does not continue to think about this.  -In regards to her left foot x-rays obtained and reviewed.  There is slight radiodensity in the  first metatarsal but have no old x-rays to compare to.  Screw from the prior surgery is broken but appears to be stable.  Recommend elevation surgical shoe and a new 1 was dispensed today.  Encouraged elevation and limit weightbearing.  -Sharply debrided hyperkeratotic lesions x3 without any complications or bleeding as a courtesy -Lyrica for neuropathy  Trula Slade DPM

## 2021-01-30 DIAGNOSIS — D4102 Neoplasm of uncertain behavior of left kidney: Secondary | ICD-10-CM | POA: Diagnosis not present

## 2021-02-04 ENCOUNTER — Other Ambulatory Visit: Payer: Self-pay | Admitting: Family Medicine

## 2021-02-06 DIAGNOSIS — K449 Diaphragmatic hernia without obstruction or gangrene: Secondary | ICD-10-CM | POA: Diagnosis not present

## 2021-02-06 DIAGNOSIS — D4102 Neoplasm of uncertain behavior of left kidney: Secondary | ICD-10-CM | POA: Diagnosis not present

## 2021-02-06 DIAGNOSIS — N2889 Other specified disorders of kidney and ureter: Secondary | ICD-10-CM | POA: Diagnosis not present

## 2021-02-06 DIAGNOSIS — D49512 Neoplasm of unspecified behavior of left kidney: Secondary | ICD-10-CM | POA: Diagnosis not present

## 2021-02-06 DIAGNOSIS — K5730 Diverticulosis of large intestine without perforation or abscess without bleeding: Secondary | ICD-10-CM | POA: Diagnosis not present

## 2021-02-10 ENCOUNTER — Ambulatory Visit (INDEPENDENT_AMBULATORY_CARE_PROVIDER_SITE_OTHER): Payer: Medicare Other | Admitting: Podiatry

## 2021-02-10 ENCOUNTER — Other Ambulatory Visit: Payer: Self-pay

## 2021-02-10 ENCOUNTER — Ambulatory Visit (INDEPENDENT_AMBULATORY_CARE_PROVIDER_SITE_OTHER): Payer: Medicare Other

## 2021-02-10 DIAGNOSIS — M84375D Stress fracture, left foot, subsequent encounter for fracture with routine healing: Secondary | ICD-10-CM | POA: Diagnosis not present

## 2021-02-10 DIAGNOSIS — M84375A Stress fracture, left foot, initial encounter for fracture: Secondary | ICD-10-CM

## 2021-02-10 DIAGNOSIS — M2061 Acquired deformities of toe(s), unspecified, right foot: Secondary | ICD-10-CM

## 2021-02-10 DIAGNOSIS — M2040 Other hammer toe(s) (acquired), unspecified foot: Secondary | ICD-10-CM

## 2021-02-13 NOTE — Progress Notes (Signed)
Subjective: 80 year old female presents the office today for concerns of pain to her right second toe as well as for follow-up evaluation of possible stress fracture of the left foot.  She said that she wore the surgical shoe for about 2 weeks and left foot and her foot improved not in any pain states she is transition to regular shoe.  She does not want to proceed with surgery on the right foot in October.  She said the second toe continues to cause discomfort she is walking on toe causing pain and she wants to have the toe amputated.   Objective: AAO x3, NAD The first and third digits are abutting the second toe with sitting plantar to the first and third toes resulting hyperkeratotic lesion.  There is no ulcerations.  This is throughout for balance causing balance issues as well as pain. Hyperkeratotic lesions present left foot submetatarsal area as well as fifth metatarsal base as well as on the plantar medial aspect of the second toe for the toe sits plantarflexed and she is walking on the digit.  Upon debridement there are no underlying ulcerations, drainage or signs of infection. There is no significant tenderness to the left foot particularly near the first metatarsal she is having pain previously.  There is no significant increase in edema there is no erythema or warmth. No pain with calf compression, swelling, warmth, erythema  Assessment: Symptomatic callus formation bilaterally, digital deformity; concern for stress fracture left foot  Plan: -All treatment options discussed with the patient including all alternatives, risks, complications.  -Repeat x-rays obtained and reviewed left foot.  No changes compared to previous x-ray.  No evidence of acute fracture or stress fracture.  History of prior surgery with a broken screw present. -She is back to her regular shoe on the left foot.  No signs of stress fracture at this time. -Regards to the right foot again discussed surgical intervention  for the right second toe.  The toe sits plantarflexed and she is walking on this causing pain she was to have the toe amputated.  We will plan on doing this next month.  When I see her back next appointment for her callus trimmed to consent forms.  Trula Slade DPM

## 2021-02-28 NOTE — Progress Notes (Deleted)
[place] [facility] [address] [place], [place] [address] [phone]  Date:  03/03/2021   Name:  Margaret [name]   DOB:  [birth year]   MRN:  [id number]  PCP:  Darreld Mclean, MD    Chief Complaint: No chief complaint on file.   History of Present Illness:  Margaret [name] is a 80 y.o. very pleasant female patient who presents with the following:  Patient seen today for follow-up visit-  history of hypertension, GERD, osteopenia, melanoma of left foot, renal insufficiency  Most recently seen by myself in November of last year She is a widow, her daughter [name] is involved in her care  Seen by Dr. Stanford Breed in August for concern of syncope.  She had already seen neurology, EEG and brain MRI negative.  Symptoms thought to be orthostatic 1 syncope-echocardiogram previously showed normal LV function.  Her symptoms have improved with increased sodium and fluid intake.  We will continue to follow. 2 hypertension-blood pressure mildly elevated.  We will follow and advance regimen as needed.  Note with orthostatic symptoms I would like to let her blood pressure run slightly higher  COVID booster Flu vaccine Needs updated labs Mammogram and bone density up-to-date Colonoscopy up-to-date  HCTZ 12.5-half tablet as needed Lorazepam as needed Metoprolol 25 mg Prilosec Pravastatin Lyrica 300 mg at bedtime Triamterene/HCTZ?  Still taking Patient Active Problem List   Diagnosis Date Noted   Open wound of left side of back 01/20/2021   Com[place]one 11/18/2020   Lipoma of back 11/18/2020   Lymphedema 11/18/2020   Melanocytic nevi of right upper limb, including shoulder 11/18/2020   Personal history of malignant melanoma of skin 11/18/2020   Sebaceous cyst 11/18/2020   Seborrheic dermatitis 11/18/2020   Pre-diabetes 04/03/2020   OA (osteoarthritis) of knee 11/13/2019   Total knee replacement status, 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    Pain in right knee 08/03/2019   Fluid collection at surgical site 10/20/2018   Encounter for postoperative examination after surgery for malignant neoplasm 09/30/2018   Malignant melanoma of left foot ([place]) 08/15/2018   Plantar fasciitis of right foot 02/03/2017   Porokeratosis 02/03/2017   Acute pancreatitis due to calculus of common bile duct 09/17/2016   Acute gallstone pancreatitis    Calculus of gallbladder with acute cholecystitis and obstruction    LFT elevation    Stress fracture 01/01/2016   Insomnia 12/16/2015   Peripheral neuropathy 10/18/2015   Renal insufficiency 03/06/2015   HTN (hypertension) 03/06/2015   Osteopenia 03/06/2015   GERD (gastroesophageal reflux disease) 03/06/2015   Vaginal pessary present 03/06/2015   Metatarsalgia 07/13/2012   Increased frequency of urination 04/13/2012   DDD (degenerative disc disease), cervical 01/19/2012   Knee stiffness 01/19/2012   Low back pain 01/19/2012   Neck pain 01/19/2012   Pain of hand 01/19/2012   Hallux valgus 09/20/2011   Hyperkeratosis 08/13/2011   Anxiety disorder 07/06/2011   Depression 07/06/2011   Diverticulosis of large intestine 07/06/2011   Non-toxic multinodular goiter 07/06/2011   Atrophic vaginitis 10/24/2010   Dermatographic urticaria 09/17/2010   Inflamed seborrheic keratosis 09/17/2010   Closed fracture of fifth metatarsal bone 07/10/2010   Diagnosis unknown 02/27/2010   Hip pain 06/08/2008   Pulmonary nodule seen on imaging study 06/08/2008    Past Medical History:  Diagnosis Date   Anxiety    Arthritis    Cancer ([place])    lt. foot melanoma   Chronic kidney disease  lt. kidney cyst   History of healed stress fracture 2013   bilateral feet   Hx of phlebitis    reports remote hx of "superfical blood clots" never anticoagulated   Hypertension    Osteopenia     Past Surgical History:  Procedure Laterality Date   CHOLECYSTECTOMY N/A 09/19/2016   Procedure: LAPAROSCOPIC CHOLECYSTECTOMY  POSSIBLE INTRAOPERATIVE CHOLANGIOGRAM;  Surgeon: Rolm Bookbinder, MD;  Location: [facility];  Service: General;  Laterality: N/A;   ERCP N/A 09/18/2016   Procedure: ENDOSCOPIC RETROGRADE CHOLANGIOPANCREATOGRAPHY (ERCP);  Surgeon: Doran Stabler, MD;  Location: [facility] ENDOSCOPY;  Service: Endoscopy;  Laterality: N/A;   FOOT SURGERY     patient reports four foot surgeries   FOOT SURGERY Left    bunion, hammer toe surgery   FOOT SURGERY Right    shaved bunion, hammer toe correction   KNEE ARTHROSCOPY Right 1997   REPLACEMENT TOTAL KNEE Left    REPLACEMENT TOTAL KNEE Left 10/2012   RETINAL DETACHMENT SURGERY     RETINAL DETACHMENT SURGERY Left 2008   THYROIDECTOMY, PARTIAL Right    THYROIDECTOMY, PARTIAL  2005   TOTAL KNEE ARTHROPLASTY Right 11/13/2019   Procedure: TOTAL KNEE ARTHROPLASTY SDDC;  Surgeon: Gaynelle Arabian, MD;  Location: [facility];  Service: Orthopedics;  Laterality: Right;  76min    Social History   Tobacco Use   Smoking status: Former    Types: Cigarettes    Quit date: 06/02/1979    Years since quitting: 41.7   Smokeless tobacco: Never  Vaping Use   Vaping Use: Never used  Substance Use Topics   Alcohol use: Yes    Alcohol/week: 1.0 standard drink    Types: 1 Glasses of wine per week    Comment: Occ   Drug use: No    Family History  Problem Relation Age of Onset   Heart disease Mother    Heart attack Mother    Heart disease Father    Diabetes Maternal Grandmother    Cancer Paternal Grandfather        stomach    Allergies  Allergen Reactions   Keflex [Cephalexin] Diarrhea   Percocet [Oxycodone-Acetaminophen] Other (See Comments)    Syncope (passed out)    Medication list has been reviewed and updated.  Current Outpatient Medications on File Prior to Visit  Medication Sig Dispense Refill   acetaminophen (TYLENOL) 500 MG tablet Take 1,000 mg by mouth every 6 (six) hours as needed (pain.).      Bromfenac Sodium (PROLENSA) 0.07 % SOLN Prolensa 0.07 % eye drops   INSTILL 1 DROP IN RIGHT EYE EVERY EVENING AS DIRECTED     gabapentin (NEURONTIN) 400 MG capsule 1 capsule     gatifloxacin (ZYMAXID) 0.5 % SOLN gatifloxacin 0.5 % eye drops  INSTILL 1 DROP IN RIGHT EYE FOUR TIMES DAILY AS DIRECTED     hydrochlorothiazide (HYDRODIURIL) 12.5 MG tablet Take 0.5 tablets (6.25 mg total) by mouth daily. Use as needed for elevated BP 30 tablet 5   ketoconazole (NIZORAL) 2 % cream Apply 1 application topically 2 (two) times daily as needed (skin irritation/rash.).     LORazepam (ATIVAN) 0.5 MG tablet 1 tablet at bedtime as needed     LORazepam (ATIVAN) 2 MG tablet TAKE 1 TO 1&1/2 TABLETS BY MOUTH EVERY NIGHT AT BEDTIME AS NEEDED FOR SLEEP. MAY TAKE 1 TABLET DURING DAY IF NEEDED 70 tablet 2   metoprolol succinate (TOPROL-XL) 25 MG 24 hr tablet TAKE ONE TABLET BY MOUTH DAILY 90 tablet  0   NONFORMULARY OR COMPOUNDED ITEM [facility]:  Peripheral Neuropathy - Bupivacaine 1%, Doxepin 3%, Gabapentin 6%, Pentoxifylline 3%, Topiramate 1%. Apply 1-2 grams to affected area 3-4 times daily. (Patient taking differently: Apply 1 application topically 4 (four) times daily as needed (neuropathy/pain (feet)). [facility]:  Peripheral Neuropathy - Bupivacaine 1%, Doxepin 3%, Gabapentin 6%, Pentoxifylline 3%, Topiramate 1%. Apply 1-2 grams to affected area 3-4 times daily.) 100 each 11   omeprazole (PRILOSEC) 20 MG capsule Take 1 capsule (20 mg total) by mouth daily. 90 capsule 3   ondansetron (ZOFRAN-ODT) 4 MG disintegrating tablet ondansetron 4 mg disintegrating tablet  DISSOLVE ONE TABLET ON TONGUE EVERY 8 HOURS IF NEEDED FOR NAUSEA AND VOMITING     pravastatin (PRAVACHOL) 20 MG tablet Take 1 tablet (20 mg total) by mouth daily. 90 tablet 3   prednisoLONE acetate (PRED FORTE) 1 % ophthalmic suspension prednisolone acetate 1 % eye drops,suspension  SHAKE LIQUID AND INSTILL 1 DROP IN RIGHT EYE FOUR TIMES DAILY AS DIRECTED     pregabalin (LYRICA) 150 MG capsule Take 1 capsule  (150 mg total) by mouth 2 (two) times daily. 60 capsule 2   sulfamethoxazole-trimethoprim (BACTRIM DS) 800-160 MG tablet Take 1 tablet by mouth 2 (two) times daily. 10 tablet 0   traMADol (ULTRAM) 50 MG tablet tramadol 50 mg tablet     triamterene-hydrochlorothiazide (MAXZIDE-25) 37.5-25 MG tablet TK 1 T PO D     No current facility-administered medications on file prior to visit.    Review of Systems:  As per HPI- otherwise negative.   Physical Examination: There were no vitals filed for this visit. There were no vitals filed for this visit. There is no height or weight on file to calculate BMI. Ideal Body Weight:    GEN: no acute distress. HEENT: Atraumatic, Normocephalic.  Ears and Nose: No external deformity. CV: RRR, No M/G/R. No JVD. No thrill. No extra heart sounds. PULM: CTA B, no wheezes, crackles, rhonchi. No retractions. No resp. distress. No accessory muscle use. ABD: S, NT, ND, +BS. No rebound. No HSM. EXTR: No c/c/e PSYCH: Normally interactive. Conversant.    Assessment and Plan: ***  Signed Lamar Blinks, MD

## 2021-03-03 ENCOUNTER — Ambulatory Visit: Payer: Medicare Other | Admitting: Family Medicine

## 2021-03-03 ENCOUNTER — Ambulatory Visit (INDEPENDENT_AMBULATORY_CARE_PROVIDER_SITE_OTHER): Payer: Medicare Other | Admitting: Podiatry

## 2021-03-03 ENCOUNTER — Encounter: Payer: Self-pay | Admitting: Podiatry

## 2021-03-03 ENCOUNTER — Encounter: Payer: Self-pay | Admitting: Family Medicine

## 2021-03-03 DIAGNOSIS — R7303 Prediabetes: Secondary | ICD-10-CM

## 2021-03-03 DIAGNOSIS — I10 Essential (primary) hypertension: Secondary | ICD-10-CM

## 2021-03-03 DIAGNOSIS — E785 Hyperlipidemia, unspecified: Secondary | ICD-10-CM

## 2021-03-03 DIAGNOSIS — G629 Polyneuropathy, unspecified: Secondary | ICD-10-CM | POA: Diagnosis not present

## 2021-03-03 DIAGNOSIS — Q828 Other specified congenital malformations of skin: Secondary | ICD-10-CM | POA: Diagnosis not present

## 2021-03-03 DIAGNOSIS — K219 Gastro-esophageal reflux disease without esophagitis: Secondary | ICD-10-CM

## 2021-03-03 DIAGNOSIS — F5102 Adjustment insomnia: Secondary | ICD-10-CM

## 2021-03-03 DIAGNOSIS — M2061 Acquired deformities of toe(s), unspecified, right foot: Secondary | ICD-10-CM | POA: Diagnosis not present

## 2021-03-03 DIAGNOSIS — R5383 Other fatigue: Secondary | ICD-10-CM

## 2021-03-03 NOTE — Patient Instructions (Signed)

## 2021-03-04 DIAGNOSIS — D4102 Neoplasm of uncertain behavior of left kidney: Secondary | ICD-10-CM | POA: Diagnosis not present

## 2021-03-04 NOTE — Progress Notes (Signed)
Subjective: 80 year old female presents the office today for concerns of pain to her right second toe and for calluses to her feet.  She states that the right second toe still causing quite a bit of pain and it sits underneath her first and third toes causing discomfort and she wants to proceed with having the toe amputated.  We discussed that the last few appointments and she is ready to proceed with this.  She said this calluses to her feet but not causing as much discomfort.  No swelling or redness or any drainage.  No open lesions.  Objective: AAO x3, NAD The first and third digits are abutting the second toe with sitting plantar to the first and third toes resulting hyperkeratotic lesion.  There is no ulcerations.  This is throughout for balance causing balance issues as well as pain. Hyperkeratotic lesions present left foot submetatarsal area as well as fifth metatarsal base as well as on the plantar medial aspect of the second toe for the toe sits plantarflexed and she is walking on the digit.  Calluses are improved today.  There is no open lesions identified. No pain with calf compression, swelling, warmth, erythema  Assessment: Symptomatic callus formation bilaterally, digital deformity  Plan: -All treatment options discussed with the patient including all alternatives, risks, complications.  -As a courtesy I debrided the calluses without any complications or bleeding and there is minimal today. -Today I again discussed with her right second toe amputation given the deformity which is causing balance issues and pain.  After discussion she wants to proceed with amputation of the toe understanding the risks. -The incision placement as well as the postoperative course was discussed with the patient. I discussed risks of the surgery which include, but not limited to, infection, bleeding, pain, swelling, need for further surgery, delayed or nonhealing, painful or ugly scar, numbness or sensation  changes, over/under correction, recurrence, transfer lesions, further deformity, hardware failure, DVT/PE, loss of toe/foot. Patient understands these risks and wishes to proceed with surgery. The surgical consent was reviewed with the patient all 3 pages were signed. No promises or guarantees were given to the outcome of the procedure. All questions were answered to the best of my ability. Before the surgery the patient was encouraged to call the office if there is any further questions. The surgery will be performed at the [facility] on an outpatient basis.  Trula Slade DPM

## 2021-03-07 ENCOUNTER — Telehealth: Payer: Self-pay

## 2021-03-07 NOTE — Telephone Encounter (Signed)
I called [name] back with Podiatry.   She says: Pt is having the 2nd toe on the Right foot amputated on 03/12/21. Dr [name] sent a message on the dates listed below just to make sure the pt has no contraindications to this surgery. [name] says you can send this directly to his in basket.

## 2021-03-07 NOTE — Telephone Encounter (Signed)
[name] with [facility] called on behalf of Dr. [name] regarding upcoming bunion surgery that patient is scheduled for on 03/03/21.  She stated Dr. [name] has messaged Dr. [name] on 9/15 & 10/3 with a few questions regarding patient's health prior to her surgery and he has not heard back from her as of yet.

## 2021-03-17 ENCOUNTER — Encounter: Payer: Medicare Other | Admitting: Podiatry

## 2021-03-25 ENCOUNTER — Other Ambulatory Visit: Payer: Self-pay

## 2021-03-25 ENCOUNTER — Ambulatory Visit: Payer: Medicare Other

## 2021-03-25 ENCOUNTER — Ambulatory Visit (INDEPENDENT_AMBULATORY_CARE_PROVIDER_SITE_OTHER): Payer: Medicare Other | Admitting: Podiatry

## 2021-03-25 DIAGNOSIS — M2061 Acquired deformities of toe(s), unspecified, right foot: Secondary | ICD-10-CM

## 2021-03-25 MED ORDER — PREGABALIN 150 MG PO CAPS: 150.0000 mg | ORAL_CAPSULE | Freq: Two times a day (BID) | ORAL | 2 refills | Status: DC

## 2021-03-26 ENCOUNTER — Other Ambulatory Visit: Payer: Self-pay | Admitting: Family Medicine

## 2021-03-26 DIAGNOSIS — F5102 Adjustment insomnia: Secondary | ICD-10-CM

## 2021-03-27 ENCOUNTER — Encounter: Payer: Medicare Other | Admitting: Podiatry

## 2021-03-28 ENCOUNTER — Encounter: Payer: Self-pay | Admitting: Podiatry

## 2021-03-28 NOTE — Progress Notes (Addendum)
[place] [facility] [address], [place], [place] [address] [phone]  Date:  04/07/2021   Name:  Margaret [name]   DOB:  September 21, 1940   MRN:  [id number]  PCP:  Darreld Mclean, MD    Chief Complaint: No chief complaint on file.   History of Present Illness:  Margaret [name] is a 80 y.o. very pleasant female patient who presents with the following:  Pt seen today for follow-up She also needs pre-operative clearance for toe amputation under geneal anesthesia.  Surgery would be per Dr [name]- fax pre-op clearance to [phone]  Last seen by myself about one year ago - history of hypertension, GERD, osteopenia, melanoma of left foot, renal insufficiency  Seen by cardiology in August  1 syncope-echocardiogram previously showed normal LV function.  Her symptoms have improved with increased sodium and fluid intake.  We will continue to follow. 2 hypertension-blood pressure mildly elevated.  We will follow and advance regimen as needed.  Note with orthostatic symptoms I would like to let her blood pressure run slightly higher.  Flu shot - done today  Covid booster -remind patient with labs EKG done in August   Pt notes she is able to walk a long distance without any CP or SOB-however, her toe is painful and makes walking difficult so she does use her walker.  For example, she is able to shop around an entire large grocery store.  Her main issue with walking is actually due to the toe they plan to amputate.  Her second toe on her right foot has turned under- it stays under the 3rd, fourth and fifth toes.  This makes it difficult and painful for her to walk and is also affecting her balance  She went out to dinner this last weekend- Saturday- and she accidentally took a lorazepam before she went  (thought was Tylenol) and then drank 2 pina coladas and she had LOC at the restaurant EMS came to the restaurant and checked her  out- they did an EKG and she improved, she was allowed to stay home   We discussed her lorazepam.  Her daughter is concerned that it may be too strong for her, she is currently taking 2 mg with instructions to take 1- 1.5 tablets at bedtime as needed for sleep.  I agree this may be too powerful for her as she ages. Feels comfortable cutting back to the 1 mg strength which we will try  She saw her urologist just recently -they are following a spot on the kidney Patient Active Problem List   Diagnosis Date Noted   Open wound of left side of back 01/20/2021   [place]one 11/18/2020   Lipoma of back 11/18/2020   Lymphedema 11/18/2020   Melanocytic nevi of right upper limb, including shoulder 11/18/2020   Personal history of malignant melanoma of skin 11/18/2020   Sebaceous cyst 11/18/2020   Seborrheic dermatitis 11/18/2020   Pre-diabetes 04/03/2020   OA (osteoarthritis) of knee 11/13/2019   Total knee replacement status, 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   Pain in right knee 08/03/2019   Fluid collection at surgical site 10/20/2018   Encounter for postoperative examination after surgery for malignant neoplasm 09/30/2018   Malignant melanoma of left foot ([place]) 08/15/2018   Plantar fasciitis of right foot 02/03/2017   Porokeratosis 02/03/2017   Acute pancreatitis due to calculus of common bile duct 09/17/2016   Acute gallstone pancreatitis  Calculus of gallbladder with acute cholecystitis and obstruction    LFT elevation    Stress fracture 01/01/2016   Insomnia 12/16/2015   Peripheral neuropathy 10/18/2015   Renal insufficiency 03/06/2015   HTN (hypertension) 03/06/2015   Osteopenia 03/06/2015   GERD (gastroesophageal reflux disease) 03/06/2015   Vaginal pessary present 03/06/2015   Metatarsalgia 07/13/2012   Increased frequency of urination 04/13/2012   DDD (degenerative disc disease), cervical 01/19/2012   Knee stiffness 01/19/2012   Low back pain 01/19/2012   Neck pain 01/19/2012   Pain of hand  01/19/2012   Hallux valgus 09/20/2011   Hyperkeratosis 08/13/2011   Anxiety disorder 07/06/2011   Depression 07/06/2011   Diverticulosis of large intestine 07/06/2011   Non-toxic multinodular goiter 07/06/2011   Atrophic vaginitis 10/24/2010   Dermatographic urticaria 09/17/2010   Inflamed seborrheic keratosis 09/17/2010   Closed fracture of fifth metatarsal bone 07/10/2010   Diagnosis unknown 02/27/2010   Hip pain 06/08/2008   Pulmonary nodule seen on imaging study 06/08/2008    Past Medical History:  Diagnosis Date   Anxiety    Arthritis    Cancer ([place])    lt. foot melanoma   Chronic kidney disease    lt. kidney cyst   History of healed stress fracture 2013   bilateral feet   Hx of phlebitis    reports remote hx of "superfical blood clots" never anticoagulated   Hypertension    Osteopenia     Past Surgical History:  Procedure Laterality Date   CHOLECYSTECTOMY N/A 09/19/2016   Procedure: LAPAROSCOPIC CHOLECYSTECTOMY POSSIBLE INTRAOPERATIVE CHOLANGIOGRAM;  Surgeon: Rolm Bookbinder, MD;  Location: [facility];  Service: General;  Laterality: N/A;   ERCP N/A 09/18/2016   Procedure: ENDOSCOPIC RETROGRADE CHOLANGIOPANCREATOGRAPHY (ERCP);  Surgeon: Doran Stabler, MD;  Location: [facility] ENDOSCOPY;  Service: Endoscopy;  Laterality: N/A;   FOOT SURGERY     patient reports four foot surgeries   FOOT SURGERY Left    bunion, hammer toe surgery   FOOT SURGERY Right    shaved bunion, hammer toe correction   KNEE ARTHROSCOPY Right 1997   REPLACEMENT TOTAL KNEE Left    REPLACEMENT TOTAL KNEE Left 10/2012   RETINAL DETACHMENT SURGERY     RETINAL DETACHMENT SURGERY Left 2008   THYROIDECTOMY, PARTIAL Right    THYROIDECTOMY, PARTIAL  2005   TOTAL KNEE ARTHROPLASTY Right 11/13/2019   Procedure: TOTAL KNEE ARTHROPLASTY SDDC;  Surgeon: Gaynelle Arabian, MD;  Location: [facility];  Service: Orthopedics;  Laterality: Right;  46min    Social History   Tobacco Use   Smoking status: Former    Types:  Cigarettes    Quit date: 06/02/1979    Years since quitting: 41.8   Smokeless tobacco: Never  Vaping Use   Vaping Use: Never used  Substance Use Topics   Alcohol use: Yes    Alcohol/week: 1.0 standard drink    Types: 1 Glasses of wine per week    Comment: Occ   Drug use: No    Family History  Problem Relation Age of Onset   Heart disease Mother    Heart attack Mother    Heart disease Father    Diabetes Maternal Grandmother    Cancer Paternal Grandfather        stomach    Allergies  Allergen Reactions   Keflex [Cephalexin] Diarrhea   Percocet [Oxycodone-Acetaminophen] Other (See Comments)    Syncope (passed out)    Medication list has been reviewed and updated.  Current Outpatient Medications on  File Prior to Visit  Medication Sig Dispense Refill   acetaminophen (TYLENOL) 500 MG tablet Take 1,000 mg by mouth every 6 (six) hours as needed (pain.).      hydrochlorothiazide (HYDRODIURIL) 12.5 MG tablet Take 0.5 tablets (6.25 mg total) by mouth daily. Use as needed for elevated BP 30 tablet 5   ketoconazole (NIZORAL) 2 % cream Apply 1 application topically 2 (two) times daily as needed (skin irritation/rash.).     metoprolol succinate (TOPROL-XL) 25 MG 24 hr tablet TAKE ONE TABLET BY MOUTH DAILY 90 tablet 0   NON FORMULARY [facility]  Creams-neuropathy cream     NONFORMULARY OR COMPOUNDED ITEM [facility]:  Peripheral Neuropathy - Bupivacaine 1%, Doxepin 3%, Gabapentin 6%, Pentoxifylline 3%, Topiramate 1%. Apply 1-2 grams to affected area 3-4 times daily. (Patient taking differently: Apply 1 application topically 4 (four) times daily as needed (neuropathy/pain (feet)). [facility]:  Peripheral Neuropathy - Bupivacaine 1%, Doxepin 3%, Gabapentin 6%, Pentoxifylline 3%, Topiramate 1%. Apply 1-2 grams to affected area 3-4 times daily.) 100 each 11   omeprazole (PRILOSEC) 20 MG capsule Take 1 capsule (20 mg total) by mouth daily. 90 capsule 3   pravastatin  (PRAVACHOL) 20 MG tablet Take 1 tablet (20 mg total) by mouth daily. 90 tablet 3   pregabalin (LYRICA) 150 MG capsule Take 1 capsule (150 mg total) by mouth 2 (two) times daily. 60 capsule 2   triamterene-hydrochlorothiazide (MAXZIDE-25) 37.5-25 MG tablet TK 1 T PO D     traMADol (ULTRAM) 50 MG tablet tramadol 50 mg tablet     No current facility-administered medications on file prior to visit.    Review of Systems:  As per HPI- otherwise negative.   Physical Examination: Vitals:   04/07/21 1329  BP: 133/82  Pulse: 65  Resp: 16  Temp: 98.6 F (37 C)  SpO2: 99%   Vitals:   04/07/21 1329  Weight: 178 lb (80.7 kg)   Body mass index is 33.63 kg/m. Ideal Body Weight:    GEN: no acute distress.  Overweight, looks well HEENT: Atraumatic, Normocephalic.  Ears and Nose: No external deformity. CV: RRR, No M/G/R. No JVD. No thrill. No extra heart sounds. PULM: CTA B, no wheezes, crackles, rhonchi. No retractions. No resp. distress. No accessory muscle use. ABD: S, NT, ND, +BS. No rebound. No HSM. EXTR: No c/c/e PSYCH: Normally interactive. Conversant.    The patient brings in a copy of the EKG tracing done recently by EMS.  It shows mild abnormalities but stable from August Assessment and Plan: Pre-operative clearance - Plan: CBC, Comprehensive metabolic panel  Essential hypertension  Pre-diabetes  Screening for hyperlipidemia - Plan: Lipid panel  Dyslipidemia - Plan: pravastatin (PRAVACHOL) 20 MG tablet  GAD (generalized anxiety disorder) - Plan: LORazepam (ATIVAN) 1 MG tablet  Needs flu shot - Plan: Flu Vaccine QUAD High Dose(Fluad)  Patient today for preoperative clearance.  She plans to have a toe amputated for podiatry, potentially under general anesthesia.  Assuming her labs are reasonable she should be okay for surgery.  I touched base informally with her cardiologist who thought that as long she does not have chest pain/SOB and she has reasonable functional  capacity she should be okay for surgery  Given flu shot today  Patient has had an episode of loss of consciousness after combining lorazepam with alcohol.  Her daughter also notes that she sometimes seems to be excessively sedated by lorazepam 2 mg.  We will cut down to 1 mg, if  this is successful we hope to cut down further She will also avoid combining this with alcohol  Signed Lamar Blinks, MD  Addendum 11/8, received her labs as below Benefits of surgery should outweigh risk, will send clearance to her podiatry surgeon Results for orders placed or performed in visit on 04/07/21  CBC  Result Value Ref Range   WBC 6.8 4.0 - 10.5 K/uL   RBC 4.26 3.87 - 5.11 Mil/uL   Platelets 191.0 150.0 - 400.0 K/uL   Hemoglobin 13.7 12.0 - 15.0 g/dL   HCT 41.1 36.0 - 46.0 %   MCV 96.7 78.0 - 100.0 fl   MCHC 33.2 30.0 - 36.0 g/dL   RDW 14.6 11.5 - 15.5 %  Comprehensive metabolic panel  Result Value Ref Range   Sodium 142 135 - 145 mEq/L   Potassium 4.1 3.5 - 5.1 mEq/L   Chloride 104 96 - 112 mEq/L   CO2 28 19 - 32 mEq/L   Glucose, Bld 94 70 - 99 mg/dL   BUN 26 (H) 6 - 23 mg/dL   Creatinine, Ser 1.10 0.40 - 1.20 mg/dL   Total Bilirubin 0.4 0.2 - 1.2 mg/dL   Alkaline Phosphatase 71 39 - 117 U/L   AST 21 0 - 37 U/L   ALT 15 0 - 35 U/L   Total Protein 6.8 6.0 - 8.3 g/dL   Albumin 4.3 3.5 - 5.2 g/dL   GFR 47.41 (L) >60.00 mL/min   Calcium 10.0 8.4 - 10.5 mg/dL  Lipid panel  Result Value Ref Range   Cholesterol 166 0 - 200 mg/dL   Triglycerides 260.0 (H) 0.0 - 149.0 mg/dL   HDL 61.30 >39.00 mg/dL   VLDL 52.0 (H) 0.0 - 40.0 mg/dL   Total CHOL/HDL Ratio 3    NonHDL 104.48   LDL cholesterol, direct  Result Value Ref Range   Direct LDL 71.0 mg/dL

## 2021-03-28 NOTE — Progress Notes (Signed)
Subjective: 80 year old female presents the office today for concerns of pain to her right second toe and for calluses to her feet.  Her surgery was canceled she has an appointment with her primary care physician.  The patient still wants to proceed with surgery.  She is not sure when she can do it as she states that both her son and daughter can need to have surgery and she had to build to help them.  Objective: AAO x3, NAD The first and third digits are abutting the second toe with sitting plantar to the first and third toes resulting hyperkeratotic lesion.  There is no ulcerations.  This is throughout for balance causing balance issues as well as pain. Hyperkeratotic lesions present left foot submetatarsal area as well as fifth metatarsal base as well as on the plantar medial aspect of the second toe for the toe sits plantarflexed and she is walking on the digit.  Calluses are improved today.  There is no open lesions identified. No pain with calf compression, swelling, warmth, erythema  Assessment: Symptomatic callus formation bilaterally, digital deformity  Plan: -All treatment options discussed with the patient including all alternatives, risks, complications.  -Sharply debrided the callus today without any complications or bleeding. -I again reviewed the surgery with her.  She has follow-up with her primary care physician and I will make sure the medical clearance letter sent to Dr. Edilia Bo.  Trula Slade DPM

## 2021-04-04 ENCOUNTER — Telehealth: Payer: Self-pay | Admitting: Family Medicine

## 2021-04-04 DIAGNOSIS — E785 Hyperlipidemia, unspecified: Secondary | ICD-10-CM

## 2021-04-04 NOTE — Telephone Encounter (Signed)
Pt called because she needed a refill, and after confirming, it was sent to a pharmacy she no longer goes to. Rx needs to be sent to:   Medication: pravastatin (PRAVACHOL) 20 MG tablet   Has the patient contacted their pharmacy? No.  Preferred Pharmacy: [facility] [id number] [place], [place]  [address] [place], [place] [address]  Phone:  [phone]  Fax:  [phone]

## 2021-04-05 DIAGNOSIS — R4020 Unspecified coma: Secondary | ICD-10-CM | POA: Diagnosis not present

## 2021-04-05 DIAGNOSIS — R0902 Hypoxemia: Secondary | ICD-10-CM | POA: Diagnosis not present

## 2021-04-05 DIAGNOSIS — R55 Syncope and collapse: Secondary | ICD-10-CM | POA: Diagnosis not present

## 2021-04-07 ENCOUNTER — Other Ambulatory Visit: Payer: Self-pay

## 2021-04-07 ENCOUNTER — Ambulatory Visit (INDEPENDENT_AMBULATORY_CARE_PROVIDER_SITE_OTHER): Payer: Medicare Other | Admitting: Family Medicine

## 2021-04-07 ENCOUNTER — Encounter: Payer: Self-pay | Admitting: Family Medicine

## 2021-04-07 VITALS — BP 133/82 | HR 65 | Temp 98.6°F | Resp 16 | Wt 178.0 lb

## 2021-04-07 DIAGNOSIS — R7303 Prediabetes: Secondary | ICD-10-CM | POA: Diagnosis not present

## 2021-04-07 DIAGNOSIS — Z01818 Encounter for other preprocedural examination: Secondary | ICD-10-CM

## 2021-04-07 DIAGNOSIS — Z23 Encounter for immunization: Secondary | ICD-10-CM | POA: Diagnosis not present

## 2021-04-07 DIAGNOSIS — I10 Essential (primary) hypertension: Secondary | ICD-10-CM | POA: Diagnosis not present

## 2021-04-07 DIAGNOSIS — Z13220 Encounter for screening for lipoid disorders: Secondary | ICD-10-CM

## 2021-04-07 DIAGNOSIS — F411 Generalized anxiety disorder: Secondary | ICD-10-CM | POA: Diagnosis not present

## 2021-04-07 DIAGNOSIS — E785 Hyperlipidemia, unspecified: Secondary | ICD-10-CM

## 2021-04-07 MED ORDER — PRAVASTATIN SODIUM 20 MG PO TABS: 20.0000 mg | ORAL_TABLET | Freq: Every day | ORAL | 3 refills | Status: DC

## 2021-04-07 MED ORDER — LORAZEPAM 1 MG PO TABS: 1.0000 mg | ORAL_TABLET | Freq: Two times a day (BID) | ORAL | 1 refills | Status: DC | PRN

## 2021-04-07 NOTE — Patient Instructions (Signed)
It was great to see you again today, assuming your labs are as expected we should be all set for your surgery  Lets decrease your dose of lorazepam/Ativan to 1 mg per dose-please let me know if this does not seem to be working for you  For joint pain, okay to use Tylenol regularly.  Just make sure you do not take more than the amount recommended per day on the package  It would also be okay to use ibuprofen or Aleve periodically as needed

## 2021-04-07 NOTE — Telephone Encounter (Signed)
Pt has appt today. FYI

## 2021-04-08 ENCOUNTER — Encounter: Payer: Self-pay | Admitting: Family Medicine

## 2021-04-08 DIAGNOSIS — N1831 Chronic kidney disease, stage 3a: Secondary | ICD-10-CM

## 2021-04-08 LAB — COMPREHENSIVE METABOLIC PANEL
ALT: 15 U/L (ref 0–35)
AST: 21 U/L (ref 0–37)
Albumin: 4.3 g/dL (ref 3.5–5.2)
Alkaline Phosphatase: 71 U/L (ref 39–117)
BUN: 26 mg/dL — ABNORMAL HIGH (ref 6–23)
CO2: 28 mEq/L (ref 19–32)
Calcium: 10 mg/dL (ref 8.4–10.5)
Chloride: 104 mEq/L (ref 96–112)
Creatinine, Ser: 1.1 mg/dL (ref 0.40–1.20)
GFR: 47.41 mL/min — ABNORMAL LOW (ref >60.00)
Glucose, Bld: 94 mg/dL (ref 70–99)
Potassium: 4.1 mEq/L (ref 3.5–5.1)
Sodium: 142 mEq/L (ref 135–145)
Total Bilirubin: 0.4 mg/dL (ref 0.2–1.2)
Total Protein: 6.8 g/dL (ref 6.0–8.3)

## 2021-04-08 LAB — CBC
HCT: 41.1 % (ref 36.0–46.0)
Hemoglobin: 13.7 g/dL (ref 12.0–15.0)
MCHC: 33.2 g/dL (ref 30.0–36.0)
MCV: 96.7 fl (ref 78.0–100.0)
Platelets: 191 10*3/uL (ref 150.0–400.0)
RBC: 4.26 Mil/uL (ref 3.87–5.11)
RDW: 14.6 % (ref 11.5–15.5)
WBC: 6.8 10*3/uL (ref 4.0–10.5)

## 2021-04-08 LAB — LIPID PANEL
Cholesterol: 166 mg/dL (ref 0–200)
HDL: 61.3 mg/dL (ref >39.00)
NonHDL: 104.48
Total CHOL/HDL Ratio: 3
Triglycerides: 260 mg/dL — ABNORMAL HIGH (ref 0.0–149.0)
VLDL: 52 mg/dL — ABNORMAL HIGH (ref 0.0–40.0)

## 2021-04-08 LAB — LDL CHOLESTEROL, DIRECT: Direct LDL: 71 mg/dL

## 2021-04-10 ENCOUNTER — Encounter: Payer: Medicare Other | Admitting: Podiatry

## 2021-04-10 ENCOUNTER — Encounter: Payer: Self-pay | Admitting: Family Medicine

## 2021-04-10 DIAGNOSIS — U071 COVID-19: Secondary | ICD-10-CM

## 2021-04-10 MED ORDER — MOLNUPIRAVIR EUA 200MG CAPSULE: 4.0000 | ORAL_CAPSULE | Freq: Two times a day (BID) | ORAL | 0 refills | Status: AC

## 2021-04-10 NOTE — Telephone Encounter (Signed)
Chief Complaint Fever (non-urgent symptom) (greater than THREE MONTHS old) Reason for Call Symptomatic / Request for [place] states she is calling for her mother. She is positive for COVID19. She is wondering if her mother can get the antiviral. Her sx developed Tuesday. Today she has a fever of 101.1. She has body aches and has congestion Translation No No Triage Reason Patient declined Nurse Assessment Nurse: Hassell Done, RN, Melanie Date/Time (Eastern Time): 04/10/2021 3:30:08 PM Confirm and document reason for call. If symptomatic, describe symptoms. ---Caller states Dr Edilia Bo had already called them and she was going to call medication in for her Mother. Caller states at this point her Mother is asleep and she would hate to wake her. Denies need for triage. Does the patient have any new or worsening symptoms? ---Yes Will a triage be completed? ---No Select reason for no triage. ---Patient declined Disp. Time Eilene Ghazi Time) Disposition Final User 04/10/2021 3:33:24 PM Clinical Call Yes Hassell Done, RN, Threasa Beards

## 2021-04-14 ENCOUNTER — Other Ambulatory Visit: Payer: Self-pay

## 2021-04-14 ENCOUNTER — Ambulatory Visit (INDEPENDENT_AMBULATORY_CARE_PROVIDER_SITE_OTHER): Payer: Medicare Other | Admitting: Podiatry

## 2021-04-14 DIAGNOSIS — G629 Polyneuropathy, unspecified: Secondary | ICD-10-CM

## 2021-04-14 DIAGNOSIS — Q828 Other specified congenital malformations of skin: Secondary | ICD-10-CM

## 2021-04-16 NOTE — Progress Notes (Signed)
Subjective: 80 year old female presents the office today for concerns of pain to her right second toe and for calluses to her feet.  She has received medical clearance from her doctor and she is scheduled to have the right second toe amputation and she is ready to get this done.  The calluses are still causing discomfort.  She denies any swelling or redness or any drainage.  No open sores that she reports.  She has no other concerns today.  Objective: AAO x3, NAD-presents with daughter The first and third digits are abutting the second toe with sitting plantar to the first and third toes resulting hyperkeratotic lesion.  There is no ulcerations.  This is throughout for balance causing balance issues as well as pain. Mild hyperkeratotic lesions present left foot submetatarsal area as well as fifth metatarsal base as well as on the plantar medial aspect of the second toe for the toe sits plantarflexed and she is walking on the digit.  There is no open lesions identified. No pain with calf compression, swelling, warmth, erythema  Assessment: Symptomatic callus formation bilaterally, digital deformity  Plan: -All treatment options discussed with the patient including all alternatives, risks, complications.  -Sharply debrided the callus today without any complications or bleeding. -I again discussed the surgery with her and she received clearance and she was to proceed.  She has no further questions or concerns we discussed the surgery on multiple occasions over the last several months.  Trula Slade DPM

## 2021-04-21 ENCOUNTER — Ambulatory Visit: Payer: Medicare Other | Admitting: Obstetrics & Gynecology

## 2021-04-21 DIAGNOSIS — Z23 Encounter for immunization: Secondary | ICD-10-CM | POA: Diagnosis not present

## 2021-04-30 ENCOUNTER — Other Ambulatory Visit: Payer: Self-pay | Admitting: Podiatry

## 2021-04-30 ENCOUNTER — Encounter: Payer: Self-pay | Admitting: Podiatry

## 2021-04-30 DIAGNOSIS — M2041 Other hammer toe(s) (acquired), right foot: Secondary | ICD-10-CM | POA: Diagnosis not present

## 2021-04-30 MED ORDER — HYDROCODONE-ACETAMINOPHEN 5-325 MG PO TABS: 1.0000 | ORAL_TABLET | Freq: Four times a day (QID) | ORAL | 0 refills | Status: DC | PRN

## 2021-04-30 MED ORDER — CLINDAMYCIN HCL 300 MG PO CAPS: 300.0000 mg | ORAL_CAPSULE | Freq: Three times a day (TID) | ORAL | 0 refills | Status: DC

## 2021-04-30 MED ORDER — PROMETHAZINE HCL 25 MG PO TABS: 25.0000 mg | ORAL_TABLET | Freq: Three times a day (TID) | ORAL | 0 refills | Status: DC | PRN

## 2021-04-30 NOTE — Progress Notes (Signed)
Post-op medications sent.  States she has been on Vicodin before without problems.

## 2021-05-02 ENCOUNTER — Telehealth: Payer: Self-pay | Admitting: *Deleted

## 2021-05-02 NOTE — Telephone Encounter (Signed)
Returned call to patient and gave physician's recommendations,verbalized understanding.

## 2021-05-02 NOTE — Telephone Encounter (Signed)
Patient is calling because she has developed a red itchy rash all over body on Thursday after starting medication (Clindamycin)on Wednesday. Please advise,has discontinue the medication until further notice.

## 2021-05-05 ENCOUNTER — Other Ambulatory Visit: Payer: Self-pay | Admitting: Family Medicine

## 2021-05-05 ENCOUNTER — Ambulatory Visit (INDEPENDENT_AMBULATORY_CARE_PROVIDER_SITE_OTHER): Payer: Medicare Other

## 2021-05-05 ENCOUNTER — Other Ambulatory Visit: Payer: Self-pay

## 2021-05-05 ENCOUNTER — Ambulatory Visit (INDEPENDENT_AMBULATORY_CARE_PROVIDER_SITE_OTHER): Payer: Medicare Other | Admitting: Podiatry

## 2021-05-05 DIAGNOSIS — M2061 Acquired deformities of toe(s), unspecified, right foot: Secondary | ICD-10-CM

## 2021-05-05 DIAGNOSIS — Z9889 Other specified postprocedural states: Secondary | ICD-10-CM

## 2021-05-06 ENCOUNTER — Other Ambulatory Visit: Payer: Self-pay | Admitting: Podiatry

## 2021-05-06 ENCOUNTER — Telehealth: Payer: Self-pay | Admitting: *Deleted

## 2021-05-06 DIAGNOSIS — Z9889 Other specified postprocedural states: Secondary | ICD-10-CM

## 2021-05-06 NOTE — Telephone Encounter (Signed)
Called and spoke with the patient on 05-01-2021 and stated that I was calling to see how the patient was doing after surgery with Dr [name] and patient stated that she was good and that there was not any fever or chills and not any nausea and the night before was terrible with the pain but today was good (05-01-2021) and I stated to call the office if any concerns or questions. Lattie Haw

## 2021-05-06 NOTE — Progress Notes (Signed)
Subjective: Margaret [name] is a 80 y.o. 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  States that she is doing good and not having significant discomfort.  She been wearing surgical shoe, elevating.  No recent injury or falls.  She states that since stopping the clindamycin her symptoms of the rash resolved.  No other concerns today.    Objective: General: No acute distress, AAOx3  DP/PT pulses palpable 2/4, CRT < 3 sec to all digits.  Right foot: Incision is well coapted without any evidence of dehiscence with sutures intact. There is no surrounding erythema, ascending cellulitis, fluctuance, crepitus, malodor, drainage/purulence. There is minimal edema around the surgical site. There is no significant pain along the surgical site.  No other areas of tenderness to bilateral lower extremities.  No other open lesions or pre-ulcerative lesions.  No pain with calf compression, swelling, warmth, erythema.   Assessment and Plan:  Status post right foot second digit amputation, doing well with no complications   -Treatment options discussed including all alternatives, risks, and complications -X-rays obtained reviewed.  Status post second digit amputation.  Bunion, hammertoe contractures are present with arthritic changes present of the midfoot. -Incision was cleaned.  A small amount of antibiotic ointment was applied followed by a bandage.  She will get the dressing clean, dry, intact -Remain in surgical shoe, weight-bear as tolerated although limited -Ice/elevation -Pain medication as needed. -Monitor for any clinical signs or symptoms of infection and DVT/PE and directed to call the office immediately should any occur or go to the ER. -Follow-up as scheduled for possible suture removal or sooner if any problems arise. In the meantime, encouraged to call the office with any questions, concerns, change in symptoms.   Celesta Gentile, DPM

## 2021-05-12 ENCOUNTER — Telehealth: Payer: Self-pay | Admitting: Family Medicine

## 2021-05-12 NOTE — Telephone Encounter (Signed)
Pt. Called and stated that she has been on the ibuprofen for a month now and it is not working. She has been in a lot of pain and wants to see about getting switched to the other medication that was discussed last visit.

## 2021-05-12 NOTE — Telephone Encounter (Signed)
Dear Dr. [name], could you please look at this message?  I am not sure what medication she is referring to, you may have discussed something at your visit earlier this month  Let me know if anything needed from my end! Jess Tandra Rosado

## 2021-05-12 NOTE — Telephone Encounter (Signed)
Please advise, should this call have been made to Podiatry?

## 2021-05-13 ENCOUNTER — Telehealth: Payer: Self-pay | Admitting: Family Medicine

## 2021-05-13 ENCOUNTER — Telehealth: Payer: Self-pay | Admitting: *Deleted

## 2021-05-13 MED ORDER — CELECOXIB 100 MG PO CAPS: ORAL_CAPSULE | ORAL | 3 refills | Status: DC

## 2021-05-13 NOTE — Telephone Encounter (Signed)
I can not tell that this pt has been on this Rx in a while, okay to send rx?

## 2021-05-13 NOTE — Telephone Encounter (Signed)
Pt was requesting arthritis medication, celebrex.   [name] [name], [place] - [place]  [address] [place], [place] [address]  Phone:  [phone]  Fax:  [phone]

## 2021-05-13 NOTE — Telephone Encounter (Signed)
Called patient and message was meant for PCP,not Podiatrist.She will contact their office to correct.

## 2021-05-15 ENCOUNTER — Other Ambulatory Visit: Payer: Self-pay

## 2021-05-15 ENCOUNTER — Ambulatory Visit (INDEPENDENT_AMBULATORY_CARE_PROVIDER_SITE_OTHER): Payer: Medicare Other | Admitting: Podiatry

## 2021-05-15 DIAGNOSIS — M2061 Acquired deformities of toe(s), unspecified, right foot: Secondary | ICD-10-CM

## 2021-05-15 DIAGNOSIS — Z9889 Other specified postprocedural states: Secondary | ICD-10-CM

## 2021-05-16 DIAGNOSIS — L821 Other seborrheic keratosis: Secondary | ICD-10-CM | POA: Diagnosis not present

## 2021-05-16 DIAGNOSIS — D225 Melanocytic nevi of trunk: Secondary | ICD-10-CM | POA: Diagnosis not present

## 2021-05-16 DIAGNOSIS — Z85820 Personal history of malignant melanoma of skin: Secondary | ICD-10-CM | POA: Diagnosis not present

## 2021-05-16 DIAGNOSIS — L578 Other skin changes due to chronic exposure to nonionizing radiation: Secondary | ICD-10-CM | POA: Diagnosis not present

## 2021-05-16 DIAGNOSIS — D2272 Melanocytic nevi of left lower limb, including hip: Secondary | ICD-10-CM | POA: Diagnosis not present

## 2021-05-16 DIAGNOSIS — L219 Seborrheic dermatitis, unspecified: Secondary | ICD-10-CM | POA: Diagnosis not present

## 2021-05-16 DIAGNOSIS — L814 Other melanin hyperpigmentation: Secondary | ICD-10-CM | POA: Diagnosis not present

## 2021-05-16 DIAGNOSIS — D2261 Melanocytic nevi of right upper limb, including shoulder: Secondary | ICD-10-CM | POA: Diagnosis not present

## 2021-05-16 DIAGNOSIS — Z23 Encounter for immunization: Secondary | ICD-10-CM | POA: Diagnosis not present

## 2021-05-18 NOTE — Progress Notes (Signed)
Subjective: Margaret [name] is a 80 y.o. 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  She presents here for suture removal.  States that she is doing well and her pain is controlled.  Denies any fevers or chills.  No chest pain or shortness of breath.  No other concerns.    Objective: General: No acute distress, AAOx3  DP/PT pulses palpable 2/4, CRT < 3 sec to all digits.  Right foot: Incision is well coapted without any evidence of dehiscence with sutures intact.  There is trace edema there is no surrounding erythema, drainage or any signs of infection.  Incision peers to be healing well at this point postoperatively. Small callus present the left foot causing discomfort but no underlying ulceration drainage or any signs of infection. No other open lesions or pre-ulcerative lesions.  No pain with calf compression, swelling, warmth, erythema.   Assessment and Plan:  Status post right foot second digit amputation, doing well with no complications   -Treatment options discussed including all alternatives, risks, and complications -Sutures removed on the right foot without any complications incisions well coapted.  Small amount of antibiotic ointment was applied followed by dressing.  Discussed that she can start to wash the foot with soap and water daily.  Apply a similar bandage.  Remain in the surgical shoe.  In about 1 week she can start to transition to regular shoe as long as incision still healing well but there is any issues to let me know and rated the surgical shoe. -As a courtesy debrided the callus on the left without any complications or bleeding  Trula Slade DPM

## 2021-05-27 ENCOUNTER — Telehealth: Payer: Self-pay | Admitting: Family Medicine

## 2021-05-27 NOTE — Telephone Encounter (Signed)
Left message for patient to call back and schedule Medicare Annual Wellness Visit (AWV) in office.  ° °If not able to come in office, please offer to do virtually or by telephone.  Left office number and my jabber #[phone]. ° °Due for AWVI ° °Please schedule at anytime with Nurse Health Advisor. °  °

## 2021-05-29 ENCOUNTER — Encounter: Payer: Medicare Other | Admitting: Podiatry

## 2021-06-05 ENCOUNTER — Ambulatory Visit (INDEPENDENT_AMBULATORY_CARE_PROVIDER_SITE_OTHER): Payer: Medicare Other | Admitting: Podiatry

## 2021-06-05 ENCOUNTER — Other Ambulatory Visit: Payer: Self-pay

## 2021-06-05 ENCOUNTER — Ambulatory Visit: Payer: Medicare Other

## 2021-06-05 DIAGNOSIS — Z9889 Other specified postprocedural states: Secondary | ICD-10-CM

## 2021-06-05 DIAGNOSIS — M2061 Acquired deformities of toe(s), unspecified, right foot: Secondary | ICD-10-CM

## 2021-06-05 DIAGNOSIS — Q828 Other specified congenital malformations of skin: Secondary | ICD-10-CM

## 2021-06-08 NOTE — Progress Notes (Signed)
Subjective: Margaret [name] is a 81 y.o. 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  She states that she is doing great she is back to her regular shoe.  She states that her pain has much improved compared to what it was prior to surgery she is walking better.  Denies any fevers or chills.  She has no other concerns today.   Objective: General: No acute distress, AAOx3  DP/PT pulses palpable 2/4, CRT < 3 sec to all digits.  Right foot: Incision is well coapted without any evidence of dehiscence with sutures intact.  Scar has well formed.  There is trace edema but there is no erythema or warmth.  Incisions healed.  Minimal callus present on the lateral aspect the foot which was present previously. Left foot: Hyperkeratotic tissue noted plantar aspect without any underlying ulceration drainage or signs of infection. No other open lesions or pre-ulcerative lesions.  No pain with calf compression, swelling, warmth, erythema.   Assessment and Plan:  Status post right foot second digit amputation, doing well with no complications   -Treatment options discussed including all alternatives, risks, and complications -From a surgical standpoint the right foot she is doing much better she is back to regular shoe.  Discussed with her increasing activity level as tolerated.  Continue to elevate toe with any postoperative edema. -As a courtesy debrided the callus on the left with any complications or bleeding.  Return in about 3 weeks (around 06/26/2021).  Trula Slade DPM

## 2021-06-09 ENCOUNTER — Other Ambulatory Visit: Payer: Self-pay

## 2021-06-09 ENCOUNTER — Other Ambulatory Visit: Payer: Self-pay | Admitting: Family Medicine

## 2021-06-09 ENCOUNTER — Telehealth: Payer: Self-pay | Admitting: Family Medicine

## 2021-06-09 DIAGNOSIS — K219 Gastro-esophageal reflux disease without esophagitis: Secondary | ICD-10-CM

## 2021-06-09 DIAGNOSIS — I10 Essential (primary) hypertension: Secondary | ICD-10-CM

## 2021-06-09 MED ORDER — HYDROCHLOROTHIAZIDE 12.5 MG PO TABS: 6.2500 mg | ORAL_TABLET | Freq: Every day | ORAL | 1 refills | Status: DC

## 2021-06-09 NOTE — Telephone Encounter (Signed)
Pharmacy not listed on second medication.     Medication: omeprazole (PRILOSEC) 20 MG capsule  hydrochlorothiazide (HYDRODIURIL) 12.5 MG tablet  Has the patient contacted their pharmacy? Yes.     Preferred Pharmacy: [facility] [id number] [place], [place] - [place]  [address] [place], [place] [address]  Phone:  [phone]  Fax:  [phone]

## 2021-06-10 ENCOUNTER — Other Ambulatory Visit: Payer: Self-pay

## 2021-06-10 DIAGNOSIS — K219 Gastro-esophageal reflux disease without esophagitis: Secondary | ICD-10-CM

## 2021-06-10 DIAGNOSIS — I10 Essential (primary) hypertension: Secondary | ICD-10-CM

## 2021-06-10 MED ORDER — OMEPRAZOLE 20 MG PO CPDR: 20.0000 mg | DELAYED_RELEASE_CAPSULE | Freq: Every day | ORAL | 1 refills | Status: DC

## 2021-06-10 MED ORDER — HYDROCHLOROTHIAZIDE 12.5 MG PO TABS: 6.2500 mg | ORAL_TABLET | Freq: Every day | ORAL | 1 refills | Status: DC

## 2021-06-10 NOTE — Telephone Encounter (Signed)
Rx's refilled. 

## 2021-06-23 ENCOUNTER — Ambulatory Visit (INDEPENDENT_AMBULATORY_CARE_PROVIDER_SITE_OTHER): Payer: Medicare Other | Admitting: Podiatry

## 2021-06-23 ENCOUNTER — Other Ambulatory Visit: Payer: Self-pay

## 2021-06-23 ENCOUNTER — Encounter: Payer: Self-pay | Admitting: Podiatry

## 2021-06-23 DIAGNOSIS — Z9889 Other specified postprocedural states: Secondary | ICD-10-CM

## 2021-06-23 DIAGNOSIS — M2061 Acquired deformities of toe(s), unspecified, right foot: Secondary | ICD-10-CM

## 2021-06-26 NOTE — Progress Notes (Signed)
Subjective: Margaret [name] is a 81 y.o. 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  She states she was doing well.  She states that she been having some neuropathy issues and she does state that after this started she was on her feet a lot more recently.  Denies any recent injury.  Asking for the callus on left foot to be trimmed today.  No open sores that she reports that she has no other concerns today.    Objective: General: No acute distress, AAOx3  DP/PT pulses palpable 2/4, CRT < 3 sec to all digits.  Right foot: Incision is well coapted without any evidence of dehiscence and scar is well formed.  She is back to wearing regular shoe and she states that she has been doing very well and her balance is much improved since the surgery.  No signs of infection.  Left foot: Hyperkeratotic lesions x2 without any underlying ulceration drainage or any signs of infection.  No open lesions bilaterally.  No other open lesions or pre-ulcerative lesions.  No pain with calf compression, swelling, warmth, erythema.   Assessment and Plan:  Status post right foot second digit amputation, doing well with no complications   -Treatment options discussed including all alternatives, risks, and complications Surgery is well-healed.  She is discharged from postoperative care. -As a courtesy debrided calluses x2 without any complications or bleeding.  Continue moisturizer, offloading.  Return in about 3 weeks   Trula Slade DPM

## 2021-07-09 ENCOUNTER — Ambulatory Visit (INDEPENDENT_AMBULATORY_CARE_PROVIDER_SITE_OTHER): Payer: Medicare Other | Admitting: Obstetrics & Gynecology

## 2021-07-09 ENCOUNTER — Other Ambulatory Visit: Payer: Self-pay

## 2021-07-09 ENCOUNTER — Encounter: Payer: Self-pay | Admitting: Obstetrics & Gynecology

## 2021-07-09 VITALS — BP 144/87 | HR 58 | Wt 176.0 lb

## 2021-07-09 DIAGNOSIS — N814 Uterovaginal prolapse, unspecified: Secondary | ICD-10-CM

## 2021-07-09 NOTE — Progress Notes (Signed)
Subjective  Margaret [name] is a 81 y.o. female here for a routine exam.[id number]. Current complaints: none. Pt recently turned 81 years old. She is followed for pelvic organ prolapse. 2 of her children are having surgery soon.      Gynecologic History No LMP recorded. Patient is postmenopausal. Contraception: post menopausal status Last Pap: not indicated.    Last mammogram: 08/19/2020. Results were: normal   Obstetric History                 OB History  Gravida Para Term Preterm AB Living  4 3 3  0 1 3  SAB IAB Ectopic Multiple Live Births     1 0 0            # Outcome Date GA Lbr Len/2nd Weight Sex Delivery Anes PTL Lv  4 SAB                    3 Term                    2 Term                    1 Term                          The following portions of the patient's history were reviewed and updated as appropriate: allergies, current medications, past family history, past medical history, past social history, past surgical history and problem list.   Review of Systems Pertinent items are noted in HPI.     Objective:  BP (!) 144/87    Pulse (!) 58    Wt 176 lb (79.8 kg)    BMI 33.25 kg/m    CONSTITUTIONAL: Well-developed, well-nourished female in no acute distress.  HENT:  Normocephalic, atraumatic EYES: Conjunctivae and EOM are normal. No scleral icterus.  NECK: Normal range of motion SKIN: Skin is warm and dry. No rash noted. Not diaphoretic.No pallor. [place]: Alert and oriented to person, place, and time. Normal coordination.  GU: EGBUS: no lesions Vagina: no blood in vault Cervix: no lesion; no mucopurulent d/c, normal in appearance.  Uterus: uterus prolapsed. Vagina clearo fo lesions.   Adnexa: no masses; sl tender     Assessment:    Assessment  Pelvic organ prolapse with pessary. Pessary removed, cleaned and replaeced     Plan:      Plan  Follow up in: 3 months.   Margaret [name], M.D., Cherlynn June

## 2021-07-10 ENCOUNTER — Telehealth: Payer: Self-pay | Admitting: Family Medicine

## 2021-07-10 MED ORDER — AMOXICILLIN-POT CLAVULANATE 500-125 MG PO TABS: 1.0000 | ORAL_TABLET | Freq: Two times a day (BID) | ORAL | 0 refills | Status: DC

## 2021-07-10 NOTE — Telephone Encounter (Signed)
Called pt- she notes urinary frequency for 2 days No hematuria, no fever no belly or back pain, no vomiting She is not sure if she can get in for a urine culture- she does not drive  She gets diarrhea with keflex but no allergic reaction  Rx for augmentin  She will call if not doing better in the next 2 days

## 2021-07-10 NOTE — Telephone Encounter (Signed)
Please advise 

## 2021-07-10 NOTE — Telephone Encounter (Signed)
Patient would like to know if [facility] can prescribe her meds for uti. Advised patient appt or labs orders are needed for urine sample. Patient stated [facility] always prescribe her meds for uti symps, and that she has been urinating a lot.   Please advise.

## 2021-07-14 ENCOUNTER — Ambulatory Visit: Payer: Medicare Other | Admitting: Podiatry

## 2021-07-15 ENCOUNTER — Telehealth: Payer: Self-pay | Admitting: Podiatry

## 2021-07-15 ENCOUNTER — Other Ambulatory Visit: Payer: Self-pay | Admitting: Podiatry

## 2021-07-15 MED ORDER — PREGABALIN 150 MG PO CAPS: 150.0000 mg | ORAL_CAPSULE | Freq: Two times a day (BID) | ORAL | 2 refills | Status: DC

## 2021-07-15 NOTE — Telephone Encounter (Signed)
Called patient and let her know that her medication has been called in

## 2021-07-15 NOTE — Progress Notes (Signed)
Sent Lyrica.

## 2021-07-15 NOTE — Telephone Encounter (Signed)
Medication pregabalin (LYRICA) 150 MG capsule  [facility] : [facility] [address]    Patient called requesting refill , she said she spoke with someone yesterday.

## 2021-07-22 ENCOUNTER — Other Ambulatory Visit (HOSPITAL_BASED_OUTPATIENT_CLINIC_OR_DEPARTMENT_OTHER): Payer: Self-pay | Admitting: Family Medicine

## 2021-07-22 DIAGNOSIS — Z1231 Encounter for screening mammogram for malignant neoplasm of breast: Secondary | ICD-10-CM

## 2021-07-28 ENCOUNTER — Other Ambulatory Visit: Payer: Self-pay

## 2021-07-28 ENCOUNTER — Ambulatory Visit (INDEPENDENT_AMBULATORY_CARE_PROVIDER_SITE_OTHER): Payer: Medicare Other | Admitting: Podiatry

## 2021-07-28 DIAGNOSIS — Q828 Other specified congenital malformations of skin: Secondary | ICD-10-CM

## 2021-07-28 DIAGNOSIS — G629 Polyneuropathy, unspecified: Secondary | ICD-10-CM

## 2021-08-01 NOTE — Progress Notes (Signed)
Subjective:   Margaret [name] is a 81 y.o. female who presents for an Initial Medicare Annual Wellness Visit.  I connected with  Alphonzo Severance on 08/05/21 by a audio enabled telemedicine application and verified that I am speaking with the correct person using two identifiers.  Patient Location: Home  Provider Location: Office/Clinic  I discussed the limitations of evaluation and management by telemedicine. The patient expressed understanding and agreed to proceed.   Review of Systems     Cardiac Risk Factors include: advanced age (>64men, >25 women);hypertension     Objective:    There were no vitals filed for this visit. There is no height or weight on file to calculate BMI.  Advanced Directives 08/05/2021 01/26/2020 11/13/2019 11/03/2019 10/20/2018 01/18/2018 09/18/2016  Does Patient Have a Medical Advance Directive? Yes Yes No No No No No  Type of Paramedic of [place];Living will [place];Living will;Out of facility DNR (pink MOST or yellow form) - - - - -  Copy of [place] in Chart? No - copy requested - - - - - -  Would patient like information on creating a medical advance directive? - - No - Patient declined - - No - Patient declined No - Patient declined    Current Medications (verified) Outpatient Encounter Medications as of 08/05/2021  Medication Sig   acetaminophen (TYLENOL) 500 MG tablet Take 1,000 mg by mouth every 6 (six) hours as needed (pain.).    amoxicillin-clavulanate (AUGMENTIN) 500-125 MG tablet Take 1 tablet (500 mg total) by mouth 2 (two) times daily.   celecoxib (CELEBREX) 100 MG capsule TAKE 1 CAPSULE(100 MG) BY MOUTH TWICE DAILY AS NEEDED   hydrochlorothiazide (HYDRODIURIL) 12.5 MG tablet Take 0.5 tablets (6.25 mg total) by mouth daily. Use as needed for elevated BP   ketoconazole (NIZORAL) 2 % cream Apply 1 application topically 2 (two) times daily as needed (skin irritation/rash.).    LORazepam (ATIVAN) 1 MG tablet Take 1 tablet (1 mg total) by mouth 2 (two) times daily as needed for anxiety.   metoprolol succinate (TOPROL-XL) 25 MG 24 hr tablet Take 1 tablet (25 mg total) by mouth daily.   NON FORMULARY [facility]  Creams-neuropathy cream   NONFORMULARY OR COMPOUNDED ITEM [facility]:  Peripheral Neuropathy - Bupivacaine 1%, Doxepin 3%, Gabapentin 6%, Pentoxifylline 3%, Topiramate 1%. Apply 1-2 grams to affected area 3-4 times daily. (Patient taking differently: Apply 1 application. topically 4 (four) times daily as needed (neuropathy/pain (feet)). [facility]:  Peripheral Neuropathy - Bupivacaine 1%, Doxepin 3%, Gabapentin 6%, Pentoxifylline 3%, Topiramate 1%. Apply 1-2 grams to affected area 3-4 times daily.)   omeprazole (PRILOSEC) 20 MG capsule Take 1 capsule (20 mg total) by mouth daily.   pravastatin (PRAVACHOL) 20 MG tablet Take 1 tablet (20 mg total) by mouth daily.   pregabalin (LYRICA) 150 MG capsule Take 1 capsule (150 mg total) by mouth 2 (two) times daily.   triamterene-hydrochlorothiazide (MAXZIDE-25) 37.5-25 MG tablet TK 1 T PO D   No facility-administered encounter medications on file as of 08/05/2021.    Allergies (verified) Keflex [cephalexin], Percocet [oxycodone-acetaminophen], and Clindamycin/lincomycin   History: Past Medical History:  Diagnosis Date   Anxiety    Arthritis    Cancer ([place])    lt. foot melanoma   Chronic kidney disease    lt. kidney cyst   History of healed stress fracture 2013   bilateral feet   Hx of phlebitis    reports  remote hx of "superfical blood clots" never anticoagulated   Hypertension    Osteopenia    Past Surgical History:  Procedure Laterality Date   CHOLECYSTECTOMY N/A 09/19/2016   Procedure: LAPAROSCOPIC CHOLECYSTECTOMY POSSIBLE INTRAOPERATIVE CHOLANGIOGRAM;  Surgeon: Rolm Bookbinder, MD;  Location: [place];  Service: General;  Laterality: N/A;   ERCP N/A 09/18/2016   Procedure:  ENDOSCOPIC RETROGRADE CHOLANGIOPANCREATOGRAPHY (ERCP);  Surgeon: Doran Stabler, MD;  Location: [facility];  Service: Endoscopy;  Laterality: N/A;   FOOT SURGERY     patient reports four foot surgeries   FOOT SURGERY Left    bunion, hammer toe surgery   FOOT SURGERY Right    shaved bunion, hammer toe correction   KNEE ARTHROSCOPY Right 1997   REPLACEMENT TOTAL KNEE Left    REPLACEMENT TOTAL KNEE Left 10/2012   RETINAL DETACHMENT SURGERY     RETINAL DETACHMENT SURGERY Left 2008   THYROIDECTOMY, PARTIAL Right    THYROIDECTOMY, PARTIAL  2005   TOTAL KNEE ARTHROPLASTY Right 11/13/2019   Procedure: TOTAL KNEE ARTHROPLASTY SDDC;  Surgeon: Gaynelle Arabian, MD;  Location: [facility];  Service: Orthopedics;  Laterality: Right;  13min   Family History  Problem Relation Age of Onset   Heart disease Mother    Heart attack Mother    Heart disease Father    Diabetes Maternal Grandmother    Cancer Paternal Grandfather        stomach   Social History   Socioeconomic History   Marital status: Widowed    Spouse name: Not on file   Number of children: 3   Years of education: Not on file   Highest education level: Not on file  Occupational History   Not on file  Tobacco Use   Smoking status: Former    Types: Cigarettes    Quit date: 06/02/1979    Years since quitting: 42.2   Smokeless tobacco: Never  Vaping Use   Vaping Use: Never used  Substance and Sexual Activity   Alcohol use: Yes    Alcohol/week: 1.0 standard drink    Types: 1 Glasses of wine per week    Comment: Occ   Drug use: No   Sexual activity: Not Currently  Other Topics Concern   Not on file  Social History Narrative   ** Merged History Encounter **       Married   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   [place], son here, on daughter in [place]   7 grandchildren   1 great grandson    Left Handed   Social Determinants of Health   Financial Resource Strain: Low Risk     Difficulty of Paying Living Expenses: Not hard at all  Food Insecurity: No Food Insecurity   Worried About Charity fundraiser in the Last Year: Never true   Arboriculturist in the Last Year: Never true  Transportation Needs: No Transportation Needs   Lack of Transportation (Medical): No   Lack of Transportation (Non-Medical): No  Physical Activity: Not on file  Stress: No Stress Concern Present   Feeling of Stress : Only a little  Social Connections: Socially Isolated   Frequency of Communication with Friends and Family: Once a week   Frequency of Social Gatherings with Friends and Family: Never   Attends Religious Services: More than 4 times per year   Active Member of [employer] or Organizations: No   Attends Archivist Meetings: Never  Marital Status: Widowed    Tobacco Counseling Counseling given: Not Answered   Clinical Intake:  Pre-visit preparation completed: Yes  Pain : No/denies pain     Nutritional Risks: None Diabetes: No     Diabetic?No  Interpreter Needed?: No  Information entered by :: [place] of Daily Living In your present state of health, do you have any difficulty performing the following activities: 08/05/2021 04/07/2021  Hearing? N N  Vision? N N  Difficulty concentrating or making decisions? N N  Walking or climbing stairs? N N  Dressing or bathing? N N  Doing errands, shopping? N N  Preparing Food and eating ? N -  Using the Toilet? N -  In the past six months, have you accidently leaked urine? Y -  Do you have problems with loss of bowel control? N -  Managing your Medications? N -  Managing your Finances? N -  Housekeeping or managing your Housekeeping? N -  Some recent data might be hidden    Patient Care Team: Copland, Gay Filler, MD as PCP - General (Family Medicine) Stanford Breed Denice Bors, MD as PCP - Cardiology (Cardiology) Cameron Sprang, MD as Consulting Physician (Neurology)  Indicate any recent  Medical Services you may have received from other than [facility] providers in the past year (date may be approximate).     Assessment:   This is a routine wellness examination for Margaret [name].  Hearing/Vision screen No results found.  Dietary issues and exercise activities discussed: Current Exercise Habits: Home exercise routine, Type of exercise: walking, Time (Minutes): 60, Frequency (Times/Week): 7, Weekly Exercise (Minutes/Week): 420, Exercise limited by: None identified   Goals Addressed   None    Depression Screen PHQ 2/9 Scores 08/05/2021 04/07/2021 04/03/2020 01/27/2017 03/17/2016 10/14/2015  PHQ - 2 Score 0 0 0 0 0 0  Exception Documentation - - - Patient refusal - -    Fall Risk Fall Risk  08/05/2021 04/07/2021 01/26/2020 01/27/2017 10/14/2015  Falls in the past year? 0 0 1 No Yes  Number falls in past yr: 0 0 1 - 2 or more  Comment - - - - Pt states all falls were related to some shoes that she changed and has had no other falls since getting different shoes.  Injury with Fall? 0 0 0 - No  Risk for fall due to : No Fall Risks - - - History of fall(s)  Follow up Falls evaluation completed - - - -    FALL RISK PREVENTION PERTAINING TO THE HOME:  Any stairs in or around the home? Yes  If so, are there any without handrails? Yes  Home free of loose throw rugs in walkways, pet beds, electrical cords, etc? Yes  Adequate lighting in your home to reduce risk of falls? Yes   ASSISTIVE DEVICES UTILIZED TO PREVENT FALLS:  Life alert? Yes  Use of a cane, walker or w/c? Yes  Grab bars in the bathroom? Yes  Shower chair or bench in shower? Yes  Elevated toilet seat or a handicapped toilet? No   TIMED UP AND GO:  Was the test performed? No , telephone visit   Cognitive Function:     6CIT Screen 08/05/2021  What Year? 0 points  What month? 0 points  What time? 0 points  Count back from 20 0 points  Months in reverse 0 points  Repeat phrase 0 points  Total Score 0     Immunizations Immunization History  Administered Date(s)  Administered   Fluad Quad(high Dose 65+) 04/07/2021   H1N1 02/25/2010   Influenza Split 06/08/2008, 05/11/2011   Influenza, High Dose Seasonal PF 03/06/2015, 03/17/2016, 01/27/2017, 02/16/2018, 02/17/2019   Influenza, Seasonal, Injecte, Preservative Fre 03/03/2012, 04/13/2013   Influenza-Unspecified 03/01/2014, 03/01/2020   Moderna Covid-19 Vaccine Bivalent Booster 51yrs & up 04/21/2021   Moderna Sars-Covid-2 Vaccination 06/28/2019, 07/12/2019, 06/27/2020   Pneumococcal Conjugate-13 02/09/2014   Pneumococcal Polysaccharide-23 03/03/2012   Td 10/31/2007, 01/17/2020   Zoster Recombinat (Shingrix) 05/17/2018, 07/26/2018   Zoster, Live 03/23/2013    TDAP status: Up to date  Flu Vaccine status: Up to date  Pneumococcal vaccine status: Up to date  Covid-19 vaccine status: Completed vaccines  Qualifies for Shingles Vaccine? Yes   Zostavax completed No   Shingrix Completed?: Yes  Screening Tests Health Maintenance  Topic Date Due   MAMMOGRAM  08/20/2022   TETANUS/TDAP  01/16/2030   Pneumonia Vaccine 9+ Years old  Completed   INFLUENZA VACCINE  Completed   DEXA SCAN  Completed   COVID-19 Vaccine  Completed   Zoster Vaccines- Shingrix  Completed   HPV VACCINES  Aged Out    Health Maintenance  There are no preventive care reminders to display for this patient.   Colorectal cancer screening: No longer required.   Mammogram status: Completed 08/19/20. Repeat every year. Pt scheduled for 08/25/2021  Bone Density status: Completed 10/24/20. Results reflect: Bone density results: OSTEOPOROSIS. Repeat every 2 years.  Lung Cancer Screening: (Low Dose CT Chest recommended if Age 64-80 years, 30 pack-year currently smoking OR have quit w/in 15years.) does not qualify.   Lung Cancer Screening Referral: N/A  Additional Screening:  Hepatitis C Screening: does not qualify; aged out  Vision Screening: Recommended annual  ophthalmology exams for early detection of glaucoma and other disorders of the eye. Is the patient up to date with their annual eye exam?  No  Who is the provider or what is the name of the office in which the patient attends annual eye exams? Dr. Talbert Forest If pt is not established with a provider, would they like to be referred to a provider to establish care? No .   Dental Screening: Recommended annual dental exams for proper oral hygiene  Community Resource Referral / Chronic Care Management: CRR required this visit?  No   CCM required this visit?  No      Plan:     I have personally reviewed and noted the following in the patients chart:   Medical and social history Use of alcohol, tobacco or illicit drugs  Current medications and supplements including opioid prescriptions. Patient is currently taking opioid prescriptions. Information provided to patient regarding non-opioid alternatives. Patient advised to discuss non-opioid treatment plan with their provider. Functional ability and status Nutritional status Physical activity Advanced directives List of other physicians Hospitalizations, surgeries, and ER visits in previous 12 months Vitals Screenings to include cognitive, depression, and falls Referrals and appointments  In addition, I have reviewed and discussed with patient certain preventive protocols, quality metrics, and best practice recommendations. A written personalized care plan for preventive services as well as general preventive health recommendations were provided to patient.   Due to this being a telephonic visit, the after visit summary with patients personalized plan was offered to patient via mail or my-chart. Patient would like to access on my-chart Margaret [name], [place]   08/05/2021   Nurse Notes: None

## 2021-08-03 ENCOUNTER — Encounter: Payer: Self-pay | Admitting: Podiatry

## 2021-08-03 NOTE — Progress Notes (Signed)
Subjective: Margaret [name] is a 81 y.o. is seen today for concerns of painful calluses on her left foot.  She also states she is having neuropathy symptoms Lyrica.  She had been on gabapentin previously but she does not recall this.  She states that since the surgery of having the second toe removed on the right foot her balance has been much better.  Objective: General: No acute distress, AAOx3  DP/PT pulses palpable 2/4, CRT < 3 sec to all digits.  Right foot: Incision well-healed. Left foot: Hyperkeratotic lesions x2 without any underlying ulceration drainage or any signs of infection.  No open lesions bilaterally.  No other open lesions or pre-ulcerative lesions.  No pain with calf compression, swelling, warmth, erythema.   Assessment and Plan:  Status post right foot second digit amputation, doing well with no complications   -Sharply debrided calluses x2 without any complications or bleeding.  Continue moisturizer, offloading. -Continue Lyrica for now.  Discussed other options for neuropathy, neuropathic pain.  Discussed medication options as well as possible referral to pain management.  Trula Slade DPM

## 2021-08-05 ENCOUNTER — Ambulatory Visit (INDEPENDENT_AMBULATORY_CARE_PROVIDER_SITE_OTHER): Payer: Medicare Other

## 2021-08-05 DIAGNOSIS — Z0000 Encounter for general adult medical examination without abnormal findings: Secondary | ICD-10-CM | POA: Diagnosis not present

## 2021-08-05 NOTE — Patient Instructions (Signed)
[name] , Thank you for taking time to come for your Medicare Wellness Visit. I appreciate your ongoing commitment to your health goals. Please review the following plan we discussed and let me know if I can assist you in the future.   Screening recommendations/referrals: Colonoscopy: no longer needed Mammogram: 08/19/20 scheduled 08/25/21 Bone Density: 10/24/20 due 10/24/21 Recommended yearly ophthalmology/optometry visit for glaucoma screening and checkup Recommended yearly dental visit for hygiene and checkup  Vaccinations: Influenza vaccine: update Pneumococcal vaccine: up to date Tdap vaccine: up to date Shingles vaccine: up to date   Covid-19:completed  Advanced directives: yes, will bring at next visit  Conditions/risks identified: see problem list  Next appointment: Follow up in one year for your annual wellness visit    Preventive Care 81 Years and Older, Female Preventive care refers to lifestyle choices and visits with your health care provider that can promote health and wellness. What does preventive care include? A yearly physical exam. This is also called an annual well check. Dental exams once or twice a year. Routine eye exams. Ask your health care provider how often you should have your eyes checked. Personal lifestyle choices, including: Daily care of your teeth and gums. Regular physical activity. Eating a healthy diet. Avoiding tobacco and drug use. Limiting alcohol use. Practicing safe sex. Taking low-dose aspirin every day. Taking vitamin and mineral supplements as recommended by your health care provider. What happens during an annual well check? The services and screenings done by your health care provider during your annual well check will depend on your age, overall health, lifestyle risk factors, and family history of disease. Counseling  Your health care provider may ask you questions about your: Alcohol use. Tobacco use. Drug use. Emotional  well-being. Home and relationship well-being. Sexual activity. Eating habits. History of falls. Memory and ability to understand (cognition). Work and work Statistician. Reproductive health. Screening  You may have the following tests or measurements: Height, weight, and BMI. Blood pressure. Lipid and cholesterol levels. These may be checked every 5 years, or more frequently if you are over 25 years old. Skin check. Lung cancer screening. You may have this screening every year starting at age 81 if you have a 30-pack-year history of smoking and currently smoke or have quit within the past 15 years. Fecal occult blood test (FOBT) of the stool. You may have this test every year starting at age 81. Flexible sigmoidoscopy or colonoscopy. You may have a sigmoidoscopy every 5 years or a colonoscopy every 10 years starting at age 81. Hepatitis C blood test. Hepatitis B blood test. Sexually transmitted disease (STD) testing. Diabetes screening. This is done by checking your blood sugar (glucose) after you have not eaten for a while (fasting). You may have this done every 1-3 years. Bone density scan. This is done to screen for osteoporosis. You may have this done starting at age 81. Mammogram. This may be done every 1-2 years. Talk to your health care provider about how often you should have regular mammograms. Talk with your health care provider about your test results, treatment options, and if necessary, the need for more tests. Vaccines  Your health care provider may recommend certain vaccines, such as: Influenza vaccine. This is recommended every year. Tetanus, diphtheria, and acellular pertussis (Tdap, Td) vaccine. You may need a Td booster every 10 years. Zoster vaccine. You may need this after age 2. Pneumococcal 13-valent conjugate (PCV13) vaccine. One dose is recommended after age 81. Pneumococcal polysaccharide (PPSV23) vaccine.  One dose is recommended after age 81. Talk to your  health care provider about which screenings and vaccines you need and how often you need them. This information is not intended to replace advice given to you by your health care provider. Make sure you discuss any questions you have with your health care provider. Document Released: 06/14/2015 Document Revised: 02/05/2016 Document Reviewed: 03/19/2015 Elsevier Interactive Patient Education  2017 Margaret [name] Prevention in the Home Falls can cause injuries. They can happen to people of all ages. There are many things you can do to make your home safe and to help prevent falls. What can I do on the outside of my home? Regularly fix the edges of walkways and driveways and fix any cracks. Remove anything that might make you trip as you walk through a door, such as a raised step or threshold. Trim any bushes or trees on the path to your home. Use bright outdoor lighting. Clear any walking paths of anything that might make someone trip, such as rocks or tools. Regularly check to see if handrails are loose or broken. Make sure that both sides of any steps have handrails. Any raised decks and porches should have guardrails on the edges. Have any leaves, snow, or ice cleared regularly. Use sand or salt on walking paths during winter. Clean up any spills in your garage right away. This includes oil or grease spills. What can I do in the bathroom? Use night lights. Install grab bars by the toilet and in the tub and shower. Do not use towel bars as grab bars. Use non-skid mats or decals in the tub or shower. If you need to sit down in the shower, use a plastic, non-slip stool. Keep the floor dry. Clean up any water that spills on the floor as soon as it happens. Remove soap buildup in the tub or shower regularly. Attach bath mats securely with double-sided non-slip rug tape. Do not have throw rugs and other things on the floor that can make you trip. What can I do in the bedroom? Use night  lights. Make sure that you have a light by your bed that is easy to reach. Do not use any sheets or blankets that are too big for your bed. They should not hang down onto the floor. Have a firm chair that has side arms. You can use this for support while you get dressed. Do not have throw rugs and other things on the floor that can make you trip. What can I do in the kitchen? Clean up any spills right away. Avoid walking on wet floors. Keep items that you use a lot in easy-to-reach places. If you need to reach something above you, use a strong step stool that has a grab bar. Keep electrical cords out of the way. Do not use floor polish or wax that makes floors slippery. If you must use wax, use non-skid floor wax. Do not have throw rugs and other things on the floor that can make you trip. What can I do with my stairs? Do not leave any items on the stairs. Make sure that there are handrails on both sides of the stairs and use them. Fix handrails that are broken or loose. Make sure that handrails are as long as the stairways. Check any carpeting to make sure that it is firmly attached to the stairs. Fix any carpet that is loose or worn. Avoid having throw rugs at the top or bottom of  the stairs. If you do have throw rugs, attach them to the floor with carpet tape. Make sure that you have a light switch at the top of the stairs and the bottom of the stairs. If you do not have them, ask someone to add them for you. What else can I do to help prevent falls? Wear shoes that: Do not have high heels. Have rubber bottoms. Are comfortable and fit you well. Are closed at the toe. Do not wear sandals. If you use a stepladder: Make sure that it is fully opened. Do not climb a closed stepladder. Make sure that both sides of the stepladder are locked into place. Ask someone to hold it for you, if possible. Clearly mark and make sure that you can see: Any grab bars or handrails. First and last  steps. Where the edge of each step is. Use tools that help you move around (mobility aids) if they are needed. These include: Canes. Walkers. Scooters. Crutches. Turn on the lights when you go into a dark area. Replace any light bulbs as soon as they burn out. Set up your furniture so you have a clear path. Avoid moving your furniture around. If any of your floors are uneven, fix them. If there are any pets around you, be aware of where they are. Review your medicines with your doctor. Some medicines can make you feel dizzy. This can increase your chance of falling. Ask your doctor what other things that you can do to help prevent falls. This information is not intended to replace advice given to you by your health care provider. Make sure you discuss any questions you have with your health care provider. Document Released: 03/14/2009 Document Revised: 10/24/2015 Document Reviewed: 06/22/2014 Elsevier Interactive Patient Education  2017 [employer].

## 2021-08-25 ENCOUNTER — Other Ambulatory Visit: Payer: Self-pay | Admitting: Family Medicine

## 2021-08-25 ENCOUNTER — Ambulatory Visit (HOSPITAL_BASED_OUTPATIENT_CLINIC_OR_DEPARTMENT_OTHER): Payer: Medicare Other

## 2021-08-25 ENCOUNTER — Other Ambulatory Visit: Payer: Self-pay

## 2021-08-25 ENCOUNTER — Ambulatory Visit (INDEPENDENT_AMBULATORY_CARE_PROVIDER_SITE_OTHER): Payer: Medicare Other | Admitting: Podiatry

## 2021-08-25 DIAGNOSIS — B070 Plantar wart: Secondary | ICD-10-CM | POA: Diagnosis not present

## 2021-08-25 DIAGNOSIS — F411 Generalized anxiety disorder: Secondary | ICD-10-CM

## 2021-08-27 NOTE — Progress Notes (Signed)
Subjective: ?Margaret [name] is a 81 y.o. is seen today for concerns of painful calluses on her left foot.  She also states she has a new lesion on the right foot causing discomfort.  Again surgical has been doing well without any issues.  Her main concern is the skin lesions today.  No swelling redness or any drainage.  No other concerns. ? ?Objective: ?General: No acute distress, AAOx3  ?DP/PT pulses palpable 2/4, CRT < 3 sec to all digits.  ?Right foot: Incision well-healed. ?Left foot: Punctate annular hyperkeratotic lesions x2 without any underlying ulceration drainage or any signs of infection.  No open lesions bilaterally.  ?Right foot:  ?No other open lesions or pre-ulcerative lesions.  ?No pain with calf compression, swelling, warmth, erythema.  ? ?Assessment and Plan:  ?Skin lesion, possible verruca left foot ? ?-Sharply debrided calluses x2 without any complications or bleeding on the left foot.  She tried moisturizer and offloading at some improvement.  Concern for possible wart.  Clean the area with alcohol and Cantharone was applied followed by an occlusive bandage.  Postprocedure instructions discussed.  Monitor for any signs or symptoms of infection. ?-On the right foot sharply debrided lesion without any complications or bleeding.  Moisturizer and offloading. ?-Continue Lyrica for now.  Discussed other options for neuropathy, neuropathic pain.  Discussed medication options as well as possible referral to pain management. ? ?Trula Slade DPM ?

## 2021-09-01 ENCOUNTER — Ambulatory Visit (HOSPITAL_BASED_OUTPATIENT_CLINIC_OR_DEPARTMENT_OTHER): Payer: Medicare Other

## 2021-09-01 ENCOUNTER — Ambulatory Visit (HOSPITAL_BASED_OUTPATIENT_CLINIC_OR_DEPARTMENT_OTHER)
Admission: RE | Admit: 2021-09-01 | Discharge: 2021-09-01 | Disposition: A | Discharge status: Home / Self Care | Payer: Medicare Other | Source: Ambulatory Visit | Attending: Family Medicine | Admitting: Family Medicine

## 2021-09-01 ENCOUNTER — Encounter (HOSPITAL_BASED_OUTPATIENT_CLINIC_OR_DEPARTMENT_OTHER): Payer: Self-pay

## 2021-09-01 DIAGNOSIS — Z1231 Encounter for screening mammogram for malignant neoplasm of breast: Secondary | ICD-10-CM | POA: Insufficient documentation

## 2021-09-10 ENCOUNTER — Other Ambulatory Visit: Payer: Self-pay | Admitting: Family Medicine

## 2021-09-16 ENCOUNTER — Ambulatory Visit (INDEPENDENT_AMBULATORY_CARE_PROVIDER_SITE_OTHER): Payer: Medicare Other | Admitting: Podiatry

## 2021-09-16 DIAGNOSIS — Q828 Other specified congenital malformations of skin: Secondary | ICD-10-CM | POA: Diagnosis not present

## 2021-09-19 NOTE — Progress Notes (Signed)
Subjective: ?Margaret [name] is a 81 y.o. is seen today for concerns of painful calluses on her left foot she has a new spot on the right foot as well.  The area is tender with pressure.  Denies any swelling or redness or any drainage. ? ?Objective: ?General: No acute distress, AAOx3  ?DP/PT pulses palpable 2/4, CRT < 3 sec to all digits.  ?Right foot: Incision well-healed. ?Left foot: Punctate annular hyperkeratotic lesions x 1 without any underlying ulceration drainage or any signs of infection.  No open lesions bilaterally.  ?Right foot: Hypersalivation which is new on the right fifth metatarsal base.  No ongoing ulceration drainage or signs of infection. ?No other open lesions or pre-ulcerative lesions.  ?No pain with calf compression, swelling, warmth, erythema.  ? ?Assessment and Plan:  ?Skin lesion, possible verruca left foot ? ?-Sharply debrided calluses x2 without any complications or bleeding. I would continue with offloading discussed moleskin and other offloading techniques.  Discussed shoe modifications and moisturizer. ?-Continue Lyrica for now.  Discussed other options for neuropathy, neuropathic pain.  Discussed medication options as well as possible referral to pain management. ? ?Trula Slade DPM ?

## 2021-09-23 NOTE — Patient Instructions (Addendum)
It was very nice to see you again today!  I will be in touch with your labs ?Plain amoxicillin to use prior to dental work ? ?Use one dose of augmentin on pessary clean/ change days to prevent UTI ? ?See me in about 6 months assuming all is well  ?

## 2021-09-23 NOTE — Progress Notes (Addendum)
Therapist, [facility] at [facility] ?[address], [address] ?[place], [place] [address] ?336 [phone] ?Fax [phone] ? ?Date:  09/29/2021  ? ?Name:  Margaret [name]   DOB:  [birth year]   MRN:  [id number] ? ?PCP:  Darreld Mclean, MD  ? ? ?Chief Complaint: 6 month follow up (Concerns/ questions: none) ? ? ?History of Present Illness: ? ?Margaret [name] is a 81 y.o. very pleasant female patient who presents with the following: ? ?Patient seen today for periodic follow-up-  history of hypertension, GERD, osteopenia, melanoma of left foot, renal insufficiency, gallstone pancreatitis status postcholecystectomy ? ?Most recent visit with myself was in November; at that time she needed clearance for a toe amputation under general anesthesia ?She notes her surgery went well and her balance is better with that toe removed,  she is very pleased  ? ?At her last visit Keylen had recently had a loss of conscious episode after combining lorazepam with alcohol.  We decreased her dose of lorazepam- ?  How is this going.  She notes she is down to 1 mg daily which she takes at bedtime, this is working well for her  ? ?She also had COVID since her last visit in November, I treated her for UTI in February ? ?Lab work done in November ?She does see urology for a renal mass, right now observation only.  Most recent visit was in October ? ?I placed a referral to nephrology in November for renal impairment,?  has she been seen yet.   ?Pt reports she has not been seen yet- she hopes her renal function may be improved as she is drinking more water  ? ?Pt notes she has a pessary for uterine prolapse- she notes she often will get a UTI  ? ?Lab Results  ?Component Value Date  ? HGBA1C 5.6 04/03/2020  ? ? ?Patient Active Problem List  ? Diagnosis Date Noted  ? Open wound of left side of back 01/20/2021  ? Comedone 11/18/2020  ? Lipoma of back 11/18/2020  ? Lymphedema 11/18/2020  ? Melanocytic nevi of right upper limb,  including shoulder 11/18/2020  ? Personal history of malignant melanoma of skin 11/18/2020  ? Sebaceous cyst 11/18/2020  ? Seborrheic dermatitis 11/18/2020  ? Pre-diabetes 04/03/2020  ? OA (osteoarthritis) of knee 11/13/2019  ? 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 Pain in right knee 08/03/2019  ? Fluid collection at surgical site 10/20/2018  ? Encounter for postoperative examination after surgery for malignant neoplasm 09/30/2018  ? 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 Plantar fasciitis of right foot 02/03/2017  ? Porokeratosis 02/03/2017  ? Acute pancreatitis due to calculus of common bile duct 09/17/2016  ? Acute gallstone pancreatitis   ? Calculus of gallbladder with acute cholecystitis and obstruction   ? LFT elevation   ? Stress fracture 01/01/2016  ? Insomnia 12/16/2015  ? Peripheral neuropathy 10/18/2015  ? Renal insufficiency 03/06/2015  ? HTN (hypertension) 03/06/2015  ? Osteopenia 03/06/2015  ? GERD (gastroesophageal reflux disease) 03/06/2015  ? Vaginal pessary present 03/06/2015  ? Metatarsalgia 07/13/2012  ? Increased frequency of urination 04/13/2012  ? DDD (degenerative disc disease), cervical 01/19/2012  ? Knee stiffness 01/19/2012  ? Low back pain 01/19/2012  ? Neck pain 01/19/2012  ? Pain of hand 01/19/2012  ? Hallux valgus 09/20/2011  ? Hyperkeratosis 08/13/2011  ? Anxiety disorder 07/06/2011  ? Depression 07/06/2011  ? Diverticulosis of large intestine 07/06/2011  ?  Non-toxic multinodular goiter 07/06/2011  ? Atrophic vaginitis 10/24/2010  ? Dermatographic urticaria 09/17/2010  ? Inflamed seborrheic keratosis 09/17/2010  ? Closed fracture of fifth metatarsal bone 07/10/2010  ? Diagnosis unknown 02/27/2010  ? Hip pain 06/08/2008  ? Pulmonary nodule seen on imaging study 06/08/2008  ? ? ?Past Medical History:  ?Diagnosis Date  ? Anxiety   ? Arthritis   ? Cancer ([place])   ? lt. foot melanoma  ? Chronic kidney disease   ? lt. kidney cyst  ? History of healed stress fracture  2013  ? bilateral feet  ? Hx of phlebitis   ? reports remote hx of "superfical blood clots" never anticoagulated  ? Hypertension   ? Osteopenia   ? ? ?Past Surgical History:  ?Procedure Laterality Date  ? CHOLECYSTECTOMY N/A 09/19/2016  ? Procedure: LAPAROSCOPIC CHOLECYSTECTOMY POSSIBLE INTRAOPERATIVE CHOLANGIOGRAM;  Surgeon: Rolm Bookbinder, MD;  Location: [facility];  Service: General;  Laterality: N/A;  ? ERCP N/A 09/18/2016  ? Procedure: ENDOSCOPIC RETROGRADE CHOLANGIOPANCREATOGRAPHY (ERCP);  Surgeon: Doran Stabler, MD;  Location[facility];  Service: Endoscopy;  Laterality: N/A;  ? FOOT SURGERY    ? patient reports four foot surgeries  ? FOOT SURGERY Left   ? bunion, hammer toe surgery  ? FOOT SURGERY Right   ? shaved bunion, hammer toe correction  ? KNEE ARTHROSCOPY Right 1997  ? REPLACEMENT TOTAL KNEE Left   ? REPLACEMENT TOTAL KNEE Left 10/2012  ? RETINAL DETACHMENT SURGERY    ? RETINAL DETACHMENT SURGERY Left 2008  ? THYROIDECTOMY, PARTIAL Right   ? THYROIDECTOMY, PARTIAL  2005  ? TOTAL KNEE ARTHROPLASTY Right 11/13/2019  ? Procedure: TOTAL KNEE ARTHROPLASTY [place];  Surgeon: Gaynelle Arabian, MD;  Location: [facility];  Service: Orthopedics;  Laterality: Right;  61mn  ? ? ?Social History  ? ?Tobacco Use  ? Smoking status: Former  ?  Types: Cigarettes  ?  Quit date: 06/02/1979  ?  Years since quitting: 42.3  ? Smokeless tobacco: Never  ?Vaping Use  ? Vaping Use: Never used  ?Substance Use Topics  ? Alcohol use: Yes  ?  Alcohol/week: 1.0 standard drink  ?  Types: 1 Glasses of wine per week  ?  Comment: Occ  ? Drug use: No  ? ? ?Family History  ?Problem Relation Age of Onset  ? Heart disease Mother   ? Heart attack Mother   ? Heart disease Father   ? Diabetes Maternal Grandmother   ? Cancer Paternal Grandfather   ?     stomach  ? ? ?Allergies  ?Allergen Reactions  ? Keflex [Cephalexin] Diarrhea  ? Percocet [Oxycodone-Acetaminophen] Other (See Comments)  ?  Syncope (passed out)  ? Clindamycin/Lincomycin Rash   ? ? ?Medication list has been reviewed and updated. ? ?Current Outpatient Medications on File Prior to Visit  ?Medication Sig Dispense Refill  ? acetaminophen (TYLENOL) 500 MG tablet Take 1,000 mg by mouth every 6 (six) hours as needed (pain.).     ? amoxicillin-clavulanate (AUGMENTIN) 500-125 MG tablet Take 1 tablet (500 mg total) by mouth 2 (two) times daily. 14 tablet 0  ? celecoxib (CELEBREX) 100 MG capsule TAKE ONE CAPSULE BY MOUTH TWICE A DAY AS NEEDED 60 capsule 3  ? hydrochlorothiazide (HYDRODIURIL) 12.5 MG tablet Take 0.5 tablets (6.25 mg total) by mouth daily. Use as needed for elevated BP 45 tablet 1  ? ketoconazole (NIZORAL) 2 % cream Apply 1 application topically 2 (two) times daily as needed (skin irritation/rash.).    ?  LORazepam (ATIVAN) 1 MG tablet TAKE ONE TABLET BY MOUTH TWICE A DAY AS NEEDED FOR ANXIETY 60 tablet 1  ? metoprolol succinate (TOPROL-XL) 25 MG 24 hr tablet Take 1 tablet (25 mg total) by mouth daily. 90 tablet 1  ? [facility] ? ?Creams-neuropathy cream    ? NONFORMULARY OR COMPOUNDED ITEM [facility]:  Peripheral Neuropathy - Bupivacaine 1%, Doxepin 3%, Gabapentin 6%, Pentoxifylline 3%, Topiramate 1%. Apply 1-2 grams to affected area 3-4 times daily. (Patient taking differently: Apply 1 application. topically 4 (four) times daily as needed (neuropathy/pain (feet)). [facility]:  Peripheral Neuropathy - Bupivacaine 1%, Doxepin 3%, Gabapentin 6%, Pentoxifylline 3%, Topiramate 1%. Apply 1-2 grams to affected area 3-4 times daily.) 100 each 11  ? omeprazole (PRILOSEC) 20 MG capsule Take 1 capsule (20 mg total) by mouth daily. 90 capsule 1  ? pravastatin (PRAVACHOL) 20 MG tablet Take 1 tablet (20 mg total) by mouth daily. 90 tablet 3  ? pregabalin (LYRICA) 150 MG capsule Take 1 capsule (150 mg total) by mouth 2 (two) times daily. 60 capsule 2  ? triamterene-hydrochlorothiazide (MAXZIDE-25) 37.5-25 MG tablet TK 1 T PO D    ? ?No current  facility-administered medications on file prior to visit.  ? ? ?Review of Systems: ? ?As per HPI- otherwise negative. ? ? ?Physical Examination: ?Vitals:  ? 09/29/21 1343  ?BP: 126/80  ?Pulse: 71  ?Resp: 18  ?Temp: 97.7 ?F (36.5 ?C

## 2021-09-29 ENCOUNTER — Ambulatory Visit (HOSPITAL_BASED_OUTPATIENT_CLINIC_OR_DEPARTMENT_OTHER)
Admission: RE | Admit: 2021-09-29 | Discharge: 2021-09-29 | Disposition: A | Discharge status: Home / Self Care | Payer: Medicare Other | Source: Ambulatory Visit | Attending: Family Medicine | Admitting: Family Medicine

## 2021-09-29 ENCOUNTER — Ambulatory Visit (INDEPENDENT_AMBULATORY_CARE_PROVIDER_SITE_OTHER): Payer: Medicare Other | Admitting: Family Medicine

## 2021-09-29 VITALS — BP 126/80 | HR 71 | Temp 97.7°F | Resp 18 | Ht 61.0 in | Wt 176.6 lb

## 2021-09-29 DIAGNOSIS — I10 Essential (primary) hypertension: Secondary | ICD-10-CM | POA: Diagnosis not present

## 2021-09-29 DIAGNOSIS — N1831 Chronic kidney disease, stage 3a: Secondary | ICD-10-CM | POA: Diagnosis not present

## 2021-09-29 DIAGNOSIS — R350 Frequency of micturition: Secondary | ICD-10-CM

## 2021-09-29 DIAGNOSIS — R221 Localized swelling, mass and lump, neck: Secondary | ICD-10-CM | POA: Insufficient documentation

## 2021-09-29 DIAGNOSIS — R7303 Prediabetes: Secondary | ICD-10-CM

## 2021-09-29 DIAGNOSIS — N1832 Chronic kidney disease, stage 3b: Secondary | ICD-10-CM

## 2021-09-29 DIAGNOSIS — Z98818 Other dental procedure status: Secondary | ICD-10-CM

## 2021-09-29 DIAGNOSIS — E785 Hyperlipidemia, unspecified: Secondary | ICD-10-CM

## 2021-09-29 DIAGNOSIS — E041 Nontoxic single thyroid nodule: Secondary | ICD-10-CM | POA: Diagnosis not present

## 2021-09-29 MED ORDER — AMOXICILLIN 500 MG PO CAPS: ORAL_CAPSULE | ORAL | 0 refills | Status: DC

## 2021-09-29 MED ORDER — AMOXICILLIN-POT CLAVULANATE 500-125 MG PO TABS: ORAL_TABLET | ORAL | 0 refills | Status: DC

## 2021-09-30 ENCOUNTER — Encounter: Payer: Self-pay | Admitting: Family Medicine

## 2021-09-30 LAB — CBC
HCT: 38.6 % (ref 36.0–46.0)
Hemoglobin: 12.8 g/dL (ref 12.0–15.0)
MCHC: 33.2 g/dL (ref 30.0–36.0)
MCV: 97.1 fl (ref 78.0–100.0)
Platelets: 196 10*3/uL (ref 150.0–400.0)
RBC: 3.98 Mil/uL (ref 3.87–5.11)
RDW: 15 % (ref 11.5–15.5)
WBC: 5.9 10*3/uL (ref 4.0–10.5)

## 2021-09-30 LAB — BASIC METABOLIC PANEL
BUN: 26 mg/dL — ABNORMAL HIGH (ref 6–23)
CO2: 31 mEq/L (ref 19–32)
Calcium: 9.5 mg/dL (ref 8.4–10.5)
Chloride: 101 mEq/L (ref 96–112)
Creatinine, Ser: 1.4 mg/dL — ABNORMAL HIGH (ref 0.40–1.20)
GFR: 35.38 mL/min — ABNORMAL LOW (ref >60.00)
Glucose, Bld: 153 mg/dL — ABNORMAL HIGH (ref 70–99)
Potassium: 4 mEq/L (ref 3.5–5.1)
Sodium: 139 mEq/L (ref 135–145)

## 2021-09-30 LAB — HEMOGLOBIN A1C: Hgb A1c MFr Bld: 5.1 % (ref 4.6–6.5)

## 2021-09-30 NOTE — Addendum Note (Signed)
Addended by: Lamar Blinks C on: 09/30/2021 02:30 PM ? ? Modules accepted: Orders ? ?

## 2021-10-01 ENCOUNTER — Ambulatory Visit: Payer: Medicare Other | Admitting: Obstetrics & Gynecology

## 2021-10-01 ENCOUNTER — Encounter: Payer: Self-pay | Admitting: Family Medicine

## 2021-10-13 ENCOUNTER — Ambulatory Visit: Payer: Medicare Other | Admitting: Podiatry

## 2021-10-15 ENCOUNTER — Encounter: Payer: Self-pay | Admitting: Family Medicine

## 2021-10-15 DIAGNOSIS — M79641 Pain in right hand: Secondary | ICD-10-CM

## 2021-10-17 MED ORDER — ACETAMINOPHEN-CODEINE 300-30 MG PO TABS: 1.0000 | ORAL_TABLET | Freq: Three times a day (TID) | ORAL | 0 refills | Status: DC | PRN

## 2021-10-17 NOTE — Addendum Note (Signed)
Addended by: Lamar Blinks C on: 10/17/2021 07:32 AM   Modules accepted: Orders

## 2021-10-19 DIAGNOSIS — R001 Bradycardia, unspecified: Secondary | ICD-10-CM | POA: Diagnosis not present

## 2021-10-19 DIAGNOSIS — R55 Syncope and collapse: Secondary | ICD-10-CM | POA: Diagnosis not present

## 2021-10-19 DIAGNOSIS — I959 Hypotension, unspecified: Secondary | ICD-10-CM | POA: Diagnosis not present

## 2021-10-20 ENCOUNTER — Encounter: Payer: Self-pay | Admitting: Family Medicine

## 2021-10-20 ENCOUNTER — Ambulatory Visit: Payer: Medicare Other | Admitting: Podiatry

## 2021-10-22 ENCOUNTER — Other Ambulatory Visit: Payer: Self-pay | Admitting: Family Medicine

## 2021-10-22 ENCOUNTER — Other Ambulatory Visit: Payer: Self-pay | Admitting: Podiatry

## 2021-10-22 DIAGNOSIS — I10 Essential (primary) hypertension: Secondary | ICD-10-CM

## 2021-10-23 ENCOUNTER — Ambulatory Visit (INDEPENDENT_AMBULATORY_CARE_PROVIDER_SITE_OTHER): Payer: Medicare Other | Admitting: Podiatry

## 2021-10-23 DIAGNOSIS — Q828 Other specified congenital malformations of skin: Secondary | ICD-10-CM | POA: Diagnosis not present

## 2021-10-23 DIAGNOSIS — G629 Polyneuropathy, unspecified: Secondary | ICD-10-CM

## 2021-10-23 MED ORDER — PREGABALIN 150 MG PO CAPS: 150.0000 mg | ORAL_CAPSULE | Freq: Two times a day (BID) | ORAL | 2 refills | Status: DC

## 2021-10-24 NOTE — Telephone Encounter (Signed)
Sent 10/23/21

## 2021-10-26 NOTE — Progress Notes (Signed)
Subjective: Margaret [name] is a 81 y.o. is seen today for concerns of neuropathy as well as for painful calluses on her feet.  No open lesions.  She still been doing well since the capitation no walking is much improved.    Objective: General: No acute distress, AAOx3  DP/PT pulses palpable 2/4, CRT < 3 sec to all digits.  Right foot: Incision well-healed. Left foot: Punctate annular hyperkeratotic lesions x 1 without any underlying ulceration drainage or any signs of infection.  No open lesions bilaterally.  Right foot: Hyperkeratotic lesion which is new on the right fifth metatarsal base.  No ongoing ulceration drainage or signs of infection. No other open lesions or pre-ulcerative lesions.  No pain with calf compression, swelling, warmth, erythema.   Assessment and Plan:  Skin lesion, possible verruca left foot  -Sharply debrided calluses x2 without any complications or bleeding. I would continue with offloading discussed moleskin and other offloading techniques.  Discussed shoe modifications and moisturizer. -Continue Lyrica for now.  Refill today.  Trula Slade DPM

## 2021-10-28 ENCOUNTER — Other Ambulatory Visit: Payer: Self-pay | Admitting: Family Medicine

## 2021-11-10 ENCOUNTER — Ambulatory Visit: Payer: Medicare Other | Admitting: Podiatry

## 2021-11-12 DIAGNOSIS — H40003 Preglaucoma, unspecified, bilateral: Secondary | ICD-10-CM | POA: Diagnosis not present

## 2021-11-12 DIAGNOSIS — H35372 Puckering of macula, left eye: Secondary | ICD-10-CM | POA: Diagnosis not present

## 2021-11-12 DIAGNOSIS — H524 Presbyopia: Secondary | ICD-10-CM | POA: Diagnosis not present

## 2021-11-12 DIAGNOSIS — Z8669 Personal history of other diseases of the nervous system and sense organs: Secondary | ICD-10-CM | POA: Diagnosis not present

## 2021-11-12 DIAGNOSIS — H52203 Unspecified astigmatism, bilateral: Secondary | ICD-10-CM | POA: Diagnosis not present

## 2021-11-12 DIAGNOSIS — H5201 Hypermetropia, right eye: Secondary | ICD-10-CM | POA: Diagnosis not present

## 2021-11-12 DIAGNOSIS — H5212 Myopia, left eye: Secondary | ICD-10-CM | POA: Diagnosis not present

## 2021-11-12 DIAGNOSIS — H43813 Vitreous degeneration, bilateral: Secondary | ICD-10-CM | POA: Diagnosis not present

## 2021-11-12 DIAGNOSIS — R7303 Prediabetes: Secondary | ICD-10-CM | POA: Diagnosis not present

## 2021-11-14 DIAGNOSIS — D225 Melanocytic nevi of trunk: Secondary | ICD-10-CM | POA: Diagnosis not present

## 2021-11-14 DIAGNOSIS — D2272 Melanocytic nevi of left lower limb, including hip: Secondary | ICD-10-CM | POA: Diagnosis not present

## 2021-11-14 DIAGNOSIS — L821 Other seborrheic keratosis: Secondary | ICD-10-CM | POA: Diagnosis not present

## 2021-11-14 DIAGNOSIS — D171 Benign lipomatous neoplasm of skin and subcutaneous tissue of trunk: Secondary | ICD-10-CM | POA: Diagnosis not present

## 2021-11-14 DIAGNOSIS — L814 Other melanin hyperpigmentation: Secondary | ICD-10-CM | POA: Diagnosis not present

## 2021-11-14 DIAGNOSIS — Z85820 Personal history of malignant melanoma of skin: Secondary | ICD-10-CM | POA: Diagnosis not present

## 2021-11-14 DIAGNOSIS — L578 Other skin changes due to chronic exposure to nonionizing radiation: Secondary | ICD-10-CM | POA: Diagnosis not present

## 2021-11-14 DIAGNOSIS — D2261 Melanocytic nevi of right upper limb, including shoulder: Secondary | ICD-10-CM | POA: Diagnosis not present

## 2021-11-24 ENCOUNTER — Telehealth: Payer: Self-pay

## 2021-11-24 ENCOUNTER — Ambulatory Visit (INDEPENDENT_AMBULATORY_CARE_PROVIDER_SITE_OTHER): Payer: Medicare Other | Admitting: Podiatry

## 2021-11-24 DIAGNOSIS — Q828 Other specified congenital malformations of skin: Secondary | ICD-10-CM

## 2021-11-24 DIAGNOSIS — F5102 Adjustment insomnia: Secondary | ICD-10-CM

## 2021-11-24 DIAGNOSIS — F4321 Adjustment disorder with depressed mood: Secondary | ICD-10-CM

## 2021-11-24 MED ORDER — TRAZODONE HCL 50 MG PO TABS: 25.0000 mg | ORAL_TABLET | Freq: Every evening | ORAL | 5 refills | Status: DC | PRN

## 2021-11-24 NOTE — Telephone Encounter (Signed)
Has used in the past- ok to refill

## 2021-11-30 NOTE — Progress Notes (Signed)
Subjective: Margaret [name] is a 81 y.o. is seen today for concerns of neuropathy as well as for painful calluses on her feet.  No open lesions.  She still been doing well since the amputation and walking is much improved.    Objective: General: No acute distress, AAOx3  DP/PT pulses palpable 2/4, CRT < 3 sec to all digits.  Right foot: Incision well-healed. Left foot: Punctate annular hyperkeratotic lesions x 1 without any underlying ulceration drainage or any signs of infection.  No open lesions bilaterally.  Right foot: Hyperkeratotic lesion which is new on the right fifth metatarsal base.  No ongoing ulceration drainage or signs of infection. No other open lesions or pre-ulcerative lesions.  No pain with calf compression, swelling, warmth, erythema.   Assessment and Plan:  Skin lesion, possible verruca left foot  -Sharply debrided calluses x 2 without any complications or bleeding. I would continue with offloading discussed moleskin and other offloading techniques.  Discussed shoe modifications and moisturizer.  Continue routine debridement and offloading.  Unfortunately when she goes longer than a month the areas become very tender and she gets blood and they become ulcerative. -Continue Lyrica for now for neuropathy  Trula Slade DPM

## 2021-12-15 ENCOUNTER — Other Ambulatory Visit: Payer: Self-pay | Admitting: Family Medicine

## 2021-12-16 ENCOUNTER — Ambulatory Visit: Payer: Medicare Other | Admitting: Podiatry

## 2021-12-17 ENCOUNTER — Ambulatory Visit (INDEPENDENT_AMBULATORY_CARE_PROVIDER_SITE_OTHER): Payer: Medicare Other | Admitting: Obstetrics & Gynecology

## 2021-12-17 ENCOUNTER — Encounter: Payer: Self-pay | Admitting: Obstetrics & Gynecology

## 2021-12-17 VITALS — BP 92/74 | HR 69 | Wt 170.0 lb

## 2021-12-17 DIAGNOSIS — Z960 Presence of urogenital implants: Secondary | ICD-10-CM

## 2021-12-17 DIAGNOSIS — N819 Female genital prolapse, unspecified: Secondary | ICD-10-CM

## 2021-12-17 NOTE — Progress Notes (Signed)
HPI Pt presents today to have pessary removed and cleaned.  She denies problems and reports that her pessary is working very well.  She has a rare episode of leakage around the pessary. She does not want to try to resize the pessary again. She denies further complaints.  She is heading to [place] in a few weeks. Her 3rd great- grand child will soon be born.     Review of Systems        Objective:  BP 92/74   Pulse 69   Wt 170 lb (77.1 kg)   BMI 32.12 kg/m   CONSTITUTIONAL: Well-developed, well-nourished female in no acute distress.  HENT:  Normocephalic, atraumatic EYES: Conjunctivae and EOM are normal. No scleral icterus.  NECK: Normal range of motion SKIN: Skin is warm and dry. No rash noted. Not diaphoretic.No pallor. Fidelity: Alert and oriented to person, place, and time. Normal coordination.  GYN: Pessary removed and cleaned; ext gen no issues noted. No excoriations or breakdown of skin      Assessment:  Pessary check  Pelvic organ prolapse Urinary incontinence- improved since pessary.    Plan:   f/u in 3 months to have pessary removed and cleaned Needs annual in 1 year F/u sooner prn    Faizon Capozzi L. Harraway-Smith, M.D., Cherlynn June

## 2021-12-23 ENCOUNTER — Ambulatory Visit (INDEPENDENT_AMBULATORY_CARE_PROVIDER_SITE_OTHER): Payer: Medicare Other | Admitting: Podiatry

## 2021-12-23 DIAGNOSIS — G629 Polyneuropathy, unspecified: Secondary | ICD-10-CM | POA: Diagnosis not present

## 2021-12-23 DIAGNOSIS — Q828 Other specified congenital malformations of skin: Secondary | ICD-10-CM

## 2021-12-23 NOTE — Progress Notes (Signed)
Subjective: Margaret [name] is a 81 y.o. is seen today for concerns of neuropathy as well as for painful calluses on her feet.  She states the neuropathy has been quite bad.  She has not yet picked up the refill of Lyrica but she is asking about this.  Also the calluses are causing pain she has new lesions.  In general she brought in sensation to the feet worsening.  No open lesions.   She is going to [place] next week for vacation.   Objective: General: No acute distress, AAOx3  DP/PT pulses palpable 2/4, CRT < 3 sec to all digits.   Sensation decreased with Semmes Weinstein monofilament Right foot: Incision well-healed. Left foot: Punctate annular hyperkeratotic lesions noted left foot metatarsal, plantar midfoot left side as well as the right fifth metatarsal base and submetatarsal area.  There is no ongoing ulceration drainage or signs of infection.  No drainage or pus.  No bleeding or capillary budding.  Prominent metatarsal heads with digital deformity. No pain with calf compression, swelling, warmth, erythema.   Assessment and Plan:  Chronic skin lesions bilaterally, neuropathy  -Sharply debrided calluses x 4 without any complications or bleeding.  Continue moisturizer discussed off.  Continue supportive shoe gear. -Discussed different treatment options for neuropathy.  She will continue with oral Lyrica we discussed different topical medications that she can use as well. -Daily foot inspection -Monitor for any clinical signs or symptoms of infection and directed to call the office immediately should any occur or go to the ER.  Return in about 3 weeks (around 01/13/2022).Trula Slade DPM

## 2022-01-13 ENCOUNTER — Ambulatory Visit (INDEPENDENT_AMBULATORY_CARE_PROVIDER_SITE_OTHER): Payer: Medicare Other | Admitting: Podiatry

## 2022-01-13 DIAGNOSIS — Q828 Other specified congenital malformations of skin: Secondary | ICD-10-CM | POA: Diagnosis not present

## 2022-01-13 DIAGNOSIS — G629 Polyneuropathy, unspecified: Secondary | ICD-10-CM

## 2022-01-19 NOTE — Progress Notes (Signed)
Subjective: Margaret [name] is a 81 y.o. is seen today for concerns of neuropathy as well as for painful calluses on her feet.  States that she recently to [place] the callus dystrophic, painful again as well as the neuropathy.  No fevers or chills.  No open lesions.  No other concerns.   Objective: General: No acute distress, AAOx3  DP/PT pulses palpable 2/4, CRT < 3 sec to all digits.   Sensation decreased with Semmes Weinstein monofilament Right foot: Annual hyperkeratotic lesion lateral plantar foot.  No underlying ulceration drainage or signs of infection. Left foot: Punctate annular hyperkeratotic lesions noted left foot metatarsal, plantar midfoot left side as well as the right fifth metatarsal base and submetatarsal area.  There is no ongoing ulceration drainage or signs of infection.  No drainage or pus.  No bleeding or capillary budding.  Prominent metatarsal heads with digital deformity. No pain with calf compression, swelling, warmth, erythema.   Assessment and Plan:  Chronic skin lesions bilaterally, neuropathy  -Sharply debrided calluses x 4 without any complications or bleeding.  Continue moisturizer discussed off.  Continue supportive shoe gear. -Continue Lyrica. -Daily foot inspection -Monitor for any clinical signs or symptoms of infection and directed to call the office immediately should any occur or go to the ER.  Trula Slade DPM

## 2022-01-23 ENCOUNTER — Other Ambulatory Visit: Payer: Self-pay | Admitting: Podiatry

## 2022-02-03 ENCOUNTER — Ambulatory Visit (INDEPENDENT_AMBULATORY_CARE_PROVIDER_SITE_OTHER): Payer: Medicare Other | Admitting: Podiatry

## 2022-02-03 DIAGNOSIS — G629 Polyneuropathy, unspecified: Secondary | ICD-10-CM | POA: Diagnosis not present

## 2022-02-03 DIAGNOSIS — Q828 Other specified congenital malformations of skin: Secondary | ICD-10-CM

## 2022-02-10 DIAGNOSIS — D4102 Neoplasm of uncertain behavior of left kidney: Secondary | ICD-10-CM | POA: Diagnosis not present

## 2022-02-10 NOTE — Progress Notes (Signed)
Subjective: Margaret [name] is a 81 y.o. is seen today for foot pain.  She states that the feet have been "killing me".  No open sores.  Still on Lyrica for the neuropathy.  Objective: General: No acute distress, AAOx3  DP/PT pulses palpable 2/4, CRT < 3 sec to all digits.   Sensation decreased with Semmes Weinstein monofilament Right foot: Annual hyperkeratotic lesion lateral plantar foot.  No underlying ulceration drainage or signs of infection. Left foot: Punctate annular hyperkeratotic lesions noted left foot metatarsal, plantar midfoot left side as well as the right fifth metatarsal base and submetatarsal area.  No ongoing ulceration drainage or signs of infection. Prominent metatarsal heads with digital deformity. No pain with calf compression, swelling, warmth, erythema.   Assessment and Plan:  Chronic skin lesions bilaterally, neuropathy  -Sharply debrided calluses x 3 without any complications or bleeding.  Continue moisturizer discussed off.  Continue supportive shoe gear. -Continue Lyrica. -Daily foot inspection -Monitor for any clinical signs or symptoms of infection and directed to call the office immediately should any occur or go to the ER.  Trula Slade DPM

## 2022-02-23 DIAGNOSIS — N2889 Other specified disorders of kidney and ureter: Secondary | ICD-10-CM | POA: Diagnosis not present

## 2022-02-23 DIAGNOSIS — K449 Diaphragmatic hernia without obstruction or gangrene: Secondary | ICD-10-CM | POA: Diagnosis not present

## 2022-02-23 DIAGNOSIS — C642 Malignant neoplasm of left kidney, except renal pelvis: Secondary | ICD-10-CM | POA: Diagnosis not present

## 2022-02-23 DIAGNOSIS — K5730 Diverticulosis of large intestine without perforation or abscess without bleeding: Secondary | ICD-10-CM | POA: Diagnosis not present

## 2022-02-23 DIAGNOSIS — D49512 Neoplasm of unspecified behavior of left kidney: Secondary | ICD-10-CM | POA: Diagnosis not present

## 2022-02-24 DIAGNOSIS — Z23 Encounter for immunization: Secondary | ICD-10-CM | POA: Diagnosis not present

## 2022-03-02 ENCOUNTER — Ambulatory Visit (INDEPENDENT_AMBULATORY_CARE_PROVIDER_SITE_OTHER): Payer: Medicare Other | Admitting: Podiatry

## 2022-03-02 DIAGNOSIS — B070 Plantar wart: Secondary | ICD-10-CM

## 2022-03-02 DIAGNOSIS — C642 Malignant neoplasm of left kidney, except renal pelvis: Secondary | ICD-10-CM | POA: Diagnosis not present

## 2022-03-02 NOTE — Patient Instructions (Signed)
Take dressing off in 8 hours and wash the foot with soap and water. If it is hurting or becomes uncomfortable before the 8 hours, go ahead and remove the bandage and wash the area.  If it blisters, apply antibiotic ointment and a band-aid.  Monitor for any signs/symptoms of infection. Call the office immediately if any occur or go directly to the emergency room. Call with any questions/concerns.   

## 2022-03-06 ENCOUNTER — Telehealth: Payer: Self-pay | Admitting: Cardiology

## 2022-03-06 ENCOUNTER — Other Ambulatory Visit: Payer: Self-pay | Admitting: Urology

## 2022-03-06 NOTE — Telephone Encounter (Signed)
    Primary Cardiologist:Brian Stanford Breed, MD  Chart reviewed as part of pre-operative protocol coverage. Because of Margaret [name]'s past medical history and time since last visit, he/she will require a follow-up visit in order to better assess preoperative cardiovascular risk.  Pre-op covering staff: - Please schedule appointment and call patient to inform them. - Please contact requesting surgeon's office via preferred method (i.e, phone, fax) to inform them of need for appointment prior to surgery.  If applicable, this message will also be routed to pharmacy pool and/or primary cardiologist for input on holding anticoagulant/antiplatelet agent as requested below so that this information is available at time of patient's appointment.   Deberah Pelton, NP  03/06/2022, 4:21 PM

## 2022-03-06 NOTE — Telephone Encounter (Signed)
   Pre-operative Risk Assessment    Patient Name: Margaret [name]  DOB: 09/22/1940 MRN: [id number]      Request for Surgical Clearance    Procedure:  PARTIAL NEPHRECTOMY AND VENTRAL HERNIA  Date of Surgery:  Clearance 06/03/21                                 Surgeon:  Dr. Alexis Frock Surgeon's Group or Practice Name:[facility]  Phone number: [phone] ext [phone]   Fax number:  [phone]   Type of Clearance Requested:   - Medical    Type of Anesthesia:  General    Additional requests/questions:    Signed, April Henson   03/06/2022, 3:53 PM

## 2022-03-06 NOTE — Telephone Encounter (Signed)
I s/w the pt and she is agreeable to see Diona Browner, NP 04/20/22 for pre op clearance at the [facility] location. Pt is aware of the [facility] location address. Pt will keep her appt 07/2022 with Dr. Stanford Breed in [place] as planned. Pt thanked me for the help.

## 2022-03-08 NOTE — Progress Notes (Signed)
Subjective: Chief Complaint  Patient presents with   Callouses    Bilateral Callus, Swelling     Margaret [name] is a 81 y.o. is seen today for foot pain.  States the calluses are causing quite a bit of pain.  She is noted some swelling to her feet but does not been hurting.  No injuries that she reports.  No fevers or chills.     Objective: General: No acute distress, AAOx3  DP/PT pulses palpable 2/4, CRT < 3 sec to all digits.   Sensation decreased with Semmes Weinstein monofilament Right foot: Annual hyperkeratotic lesion lateral plantar foot.  No underlying ulceration drainage or signs of infection. Left foot: Punctate annular hyperkeratotic lesions noted left foot metatarsal, plantar midfoot left side as well as the right fifth metatarsal base and submetatarsal area.  No ongoing ulceration drainage or signs of infection. Mild swelling dorsal feet bilaterally without any erythema or warmth.  No tenderness associated with this. Prominent metatarsal heads with digital deformity. No pain with calf compression, swelling, warmth, erythema.   Assessment and Plan:  Chronic skin lesions bilaterally, neuropathy, swelling  -Sharply debrided calluses x 3 without any complications or bleeding.  Recommend skin with alcohol and Cantharone Plus was applied followed by an occlusive bandage.  Postprocedure instructions discussed.  Monitor for any signs or symptoms of infection -Continue Lyrica. -Swelling seems to be very localized in the top of her foot and it seems to be where the shoe was rubbing.  Discussed shoe modifications, elevation. -Daily foot inspection -Monitor for any clinical signs or symptoms of infection and directed to call the office immediately should any occur or go to the ER.  Trula Slade DPM

## 2022-03-11 ENCOUNTER — Ambulatory Visit (INDEPENDENT_AMBULATORY_CARE_PROVIDER_SITE_OTHER): Payer: Medicare Other | Admitting: Obstetrics & Gynecology

## 2022-03-11 ENCOUNTER — Encounter: Payer: Self-pay | Admitting: Obstetrics & Gynecology

## 2022-03-11 VITALS — BP 140/75 | HR 66 | Wt 173.0 lb

## 2022-03-11 DIAGNOSIS — N814 Uterovaginal prolapse, unspecified: Secondary | ICD-10-CM | POA: Diagnosis not present

## 2022-03-11 DIAGNOSIS — N8111 Cystocele, midline: Secondary | ICD-10-CM

## 2022-03-11 NOTE — Progress Notes (Signed)
HPI Pt presents today to have pessary removed and cleaned.  She denies problems and reports that her pessary is working very well.  She has a rare episode of leakage around the pessary. She does not want to try to resize the pessary again. She denies further complaints.  She is heading to [place] in a few weeks. Her 3rd great- grand child will soon be born.     Review of Systems        Objective:  BP 92/74   Pulse 69   Wt 170 lb (77.1 kg)   BMI 32.12 kg/m   CONSTITUTIONAL: Well-developed, well-nourished female in no acute distress.  HENT:  Normocephalic, atraumatic EYES: Conjunctivae and EOM are normal. No scleral icterus.  NECK: Normal range of motion SKIN: Skin is warm and dry. No rash noted. Not diaphoretic.No pallor. [place]: Alert and oriented to person, place, and time. Normal coordination.  GYN: Pessary removed and cleaned; ext gen no issues noted. No excoriations or breakdown of skin      Assessment:  Pessary check  Pelvic organ prolapse Urinary incontinence- improved since pessary.    Plan:   f/u in 3 months to have pessary removed and cleaned Needs annual in 1 year F/u sooner prn    Laela Deviney L. Harraway-Smith, M.D., [place]lynn June

## 2022-03-19 ENCOUNTER — Other Ambulatory Visit: Payer: Self-pay | Admitting: Family Medicine

## 2022-03-27 ENCOUNTER — Encounter: Payer: Self-pay | Admitting: Podiatry

## 2022-03-27 ENCOUNTER — Ambulatory Visit (INDEPENDENT_AMBULATORY_CARE_PROVIDER_SITE_OTHER): Payer: Medicare Other | Admitting: Podiatry

## 2022-03-27 DIAGNOSIS — B070 Plantar wart: Secondary | ICD-10-CM | POA: Diagnosis not present

## 2022-03-27 NOTE — Patient Instructions (Signed)
Take dressing off in 8 hours and wash the foot with soap and water. If it is hurting or becomes uncomfortable before the 8 hours, go ahead and remove the bandage and wash the area.  If it blisters, apply antibiotic ointment and a band-aid.  Monitor for any signs/symptoms of infection. Call the office immediately if any occur or go directly to the emergency room. Call with any questions/concerns.   

## 2022-03-31 NOTE — Progress Notes (Signed)
Subjective: Chief Complaint  Patient presents with   Callouses    Trim calluses bilateral    81 year old female presents For Further Evaluation.  She States That after the [facility] Last Appointment It Did Burn for Couple Days.  It Was Helpful.  Thoracic Spine Redness or Drainage.  Lesions.  No Other Concerns Today.   Objective: General: No acute distress, AAOx3  DP/PT pulses palpable 2/4, CRT < 3 sec to all digits.   Sensation decreased with Semmes Weinstein monofilament Right foot: Annual hyperkeratotic lesion lateral plantar foot.  No underlying ulceration drainage or signs of infection. Left foot: Punctate annular hyperkeratotic lesions noted left foot metatarsal, plantar midfoot left side as well as the right fifth metatarsal base and submetatarsal area.  No ongoing ulceration drainage or signs of infection. Overall the lesions on both feet appear to be doing somewhat better compared to last appointment but still causing pain. Mild swelling dorsal feet bilaterally without any erythema or warmth.  No tenderness associated with this. Prominent metatarsal heads with digital deformity. No pain with calf compression, swelling, warmth, erythema.   Assessment and Plan:  Chronic skin lesions bilaterally, neuropathy, swelling  -Sharply debrided calluses x 3 without any complications or bleeding.  This seems to have been helpful.  Recommend skin with alcohol and Cantharone Plus was applied followed by an occlusive bandage.  Postprocedure instructions discussed.  Monitor for any signs or symptoms of infection -Continue Lyrica for neuropathy. -Swelling seems to be very localized in the top of her foot and it seems to be where the shoe was rubbing.  Discussed shoe modifications, elevation. -Daily foot inspection -Monitor for any clinical signs or symptoms of infection and directed to call the office immediately should any occur or go to the ER.  Trula Slade DPM

## 2022-04-01 ENCOUNTER — Other Ambulatory Visit: Payer: Self-pay | Admitting: Family Medicine

## 2022-04-01 DIAGNOSIS — E785 Hyperlipidemia, unspecified: Secondary | ICD-10-CM

## 2022-04-13 ENCOUNTER — Other Ambulatory Visit: Payer: Self-pay | Admitting: Family Medicine

## 2022-04-13 DIAGNOSIS — F411 Generalized anxiety disorder: Secondary | ICD-10-CM

## 2022-04-14 ENCOUNTER — Ambulatory Visit (INDEPENDENT_AMBULATORY_CARE_PROVIDER_SITE_OTHER): Payer: Medicare Other | Admitting: Medical

## 2022-04-14 ENCOUNTER — Ambulatory Visit (HOSPITAL_BASED_OUTPATIENT_CLINIC_OR_DEPARTMENT_OTHER)
Admission: RE | Admit: 2022-04-14 | Discharge: 2022-04-14 | Disposition: A | Discharge status: Home / Self Care | Payer: Medicare Other | Source: Ambulatory Visit | Attending: Medical | Admitting: Medical

## 2022-04-14 VITALS — BP 136/74 | HR 66 | Resp 18 | Ht 61.0 in | Wt 170.6 lb

## 2022-04-14 DIAGNOSIS — N390 Urinary tract infection, site not specified: Secondary | ICD-10-CM | POA: Diagnosis not present

## 2022-04-14 DIAGNOSIS — M25552 Pain in left hip: Secondary | ICD-10-CM | POA: Insufficient documentation

## 2022-04-14 DIAGNOSIS — M545 Low back pain, unspecified: Secondary | ICD-10-CM | POA: Diagnosis not present

## 2022-04-14 DIAGNOSIS — M5440 Lumbago with sciatica, unspecified side: Secondary | ICD-10-CM

## 2022-04-14 DIAGNOSIS — M4316 Spondylolisthesis, lumbar region: Secondary | ICD-10-CM | POA: Diagnosis not present

## 2022-04-14 LAB — POCT URINALYSIS DIPSTICK
Bilirubin, UA: NEGATIVE
Blood, UA: NEGATIVE
Glucose, UA: NEGATIVE
Ketones, UA: NEGATIVE
Leukocytes, UA: NEGATIVE
Nitrite, UA: NEGATIVE
Protein, UA: NEGATIVE
Spec Grav, UA: 1.01 (ref 1.010–1.025)
Urobilinogen, UA: 0.2 E.U./dL
pH, UA: 7.5 (ref 5.0–8.0)

## 2022-04-14 MED ORDER — METHYLPREDNISOLONE 4 MG PO TABS: ORAL_TABLET | ORAL | 0 refills | Status: DC

## 2022-04-14 NOTE — Addendum Note (Signed)
Addended by: Anabel Halon on: 04/14/2022 04:04 PM   Modules accepted: Orders

## 2022-04-14 NOTE — Patient Instructions (Addendum)
Low back pain, sciatica and left hip pain. Get xray of both area.  Can continue tylenol and add on taper medrol 6 day course.   Can try salon pas patch middle to left si area.  Back stretching exercise as tolerated if pain level decreasing.  Follow up 7-10 days or sooner if needed  [facility] did ua but area of pain not in kidney area and no uti symptoms so did not get culture.

## 2022-04-14 NOTE — Progress Notes (Signed)
Subjective:    Patient ID: Margaret [name], female    DOB: 01-12-1941, 81 y.o.   MRN: [id number]  HPI  Pt in with low back pain and left hip. Pain started on Saturday. She was doing some side kick low level exercise earlier in the day. 2-3 hours later pain came on gradually.   Some pain from low back that radiates to her left hip. Some pain across low back.  Pt taking tylenol 2 every 6-8 hours for pain. Not helping much. No naids.   Review of Systems  Constitutional:  Negative for chills, fatigue and fever.  Respiratory:  Negative for cough, chest tightness, shortness of breath and wheezing.   Cardiovascular:  Negative for chest pain and palpitations.  Gastrointestinal:  Negative for abdominal pain.  Genitourinary:  Negative for dysuria, flank pain, frequency and urgency.  Musculoskeletal:  Positive for back pain.       Hip pain.  Skin:  Negative for rash.  Neurological:  Negative for dizziness, seizures and light-headedness.  Hematological:  Negative for adenopathy. Does not bruise/bleed easily.  Psychiatric/Behavioral:  Negative for behavioral problems, confusion and dysphoric mood.     Past Medical History:  Diagnosis Date   Anxiety    Arthritis    Cancer ([place])    lt. foot melanoma   Chronic kidney disease    lt. kidney cyst   History of healed stress fracture 2013   bilateral feet   Hx of phlebitis    reports remote hx of "superfical blood clots" never anticoagulated   Hypertension    Osteopenia      Social History   Socioeconomic History   Marital status: Widowed    Spouse name: Not on file   Number of children: 3   Years of education: Not on file   Highest education level: Not on file  Occupational History   Not on file  Tobacco Use   Smoking status: Former    Types: Cigarettes    Quit date: 06/02/1979    Years since quitting: 42.8   Smokeless tobacco: Never  Vaping Use   Vaping Use: Never used  Substance and Sexual Activity   Alcohol use: Yes     Alcohol/week: 1.0 standard drink of alcohol    Types: 1 Glasses of wine per week    Comment: Occ   Drug use: No   Sexual activity: Not Currently  Other Topics Concern   Not on file  Social History Narrative   ** Merged History Encounter **       Married   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   [place], son here, on daughter in [place]   7 grandchildren   1 great grandson    Left Handed   Social Determinants of Health   Financial Resource Strain: Low Risk  (08/05/2021)   Overall Financial Resource Strain (CARDIA)    Difficulty of Paying Living Expenses: Not hard at all  Food Insecurity: No Food Insecurity (08/05/2021)   Hunger Vital Sign    Worried About Running Out of Food in the Last Year: Never true    [place] in the Last Year: Never true  Transportation Needs: No Transportation Needs (08/05/2021)   PRAPARE - Hydrologist (Medical): No    Lack of Transportation (Non-Medical): No  Physical Activity: Not on file  Stress: No Stress Concern Present (08/05/2021)   [place]  Stress Questionnaire    Feeling of Stress : Only a little  Social Connections: Socially Isolated (08/05/2021)   Social Connection and Isolation Panel [NHANES]    Frequency of Communication with Friends and Family: Once a week    Frequency of Social Gatherings with Friends and Family: Never    Attends Religious Services: More than 4 times per year    Active Member of [employer] or Organizations: No    Attends Archivist Meetings: Never    Marital Status: Widowed  Intimate Partner Violence: Not At Risk (08/05/2021)   Humiliation, Afraid, Rape, and Kick questionnaire    Fear of Current or Ex-Partner: No    Emotionally Abused: No    Physically Abused: No    Sexually Abused: No    Past Surgical History:  Procedure Laterality Date   CHOLECYSTECTOMY N/A 09/19/2016   Procedure: LAPAROSCOPIC  CHOLECYSTECTOMY POSSIBLE INTRAOPERATIVE CHOLANGIOGRAM;  Surgeon: Rolm Bookbinder, MD;  Location: [place];  Service: General;  Laterality: N/A;   ERCP N/A 09/18/2016   Procedure: ENDOSCOPIC RETROGRADE CHOLANGIOPANCREATOGRAPHY (ERCP);  Surgeon: Doran Stabler, MD;  Location: [facility] ENDOSCOPY;  Service: Endoscopy;  Laterality: N/A;   FOOT SURGERY     patient reports four foot surgeries   FOOT SURGERY Left    bunion, hammer toe surgery   FOOT SURGERY Right    shaved bunion, hammer toe correction   KNEE ARTHROSCOPY Right 1997   REPLACEMENT TOTAL KNEE Left    REPLACEMENT TOTAL KNEE Left 10/2012   RETINAL DETACHMENT SURGERY     RETINAL DETACHMENT SURGERY Left 2008   THYROIDECTOMY, PARTIAL Right    THYROIDECTOMY, PARTIAL  2005   TOTAL KNEE ARTHROPLASTY Right 11/13/2019   Procedure: TOTAL KNEE ARTHROPLASTY SDDC;  Surgeon: Gaynelle Arabian, MD;  Location: [facility];  Service: Orthopedics;  Laterality: Right;  27mn    Family History  Problem Relation Age of Onset   Heart disease Mother    Heart attack Mother    Heart disease Father    Diabetes Maternal Grandmother    Cancer Paternal Grandfather        stomach    Allergies  Allergen Reactions   Keflex [Cephalexin] Diarrhea   Percocet [Oxycodone-Acetaminophen] Other (See Comments)    Syncope (passed out)   Clindamycin/Lincomycin Rash    Current Outpatient Medications on File Prior to Visit  Medication Sig Dispense Refill   acetaminophen (TYLENOL) 500 MG tablet Take 1,000 mg by mouth every 6 (six) hours as needed (pain.).      amoxicillin (AMOXIL) 500 MG capsule Take 2 gm (4 tablets) one hour prior to dental procedure (Patient not taking: Reported on 12/17/2021) 20 capsule 0   amoxicillin-clavulanate (AUGMENTIN) 500-125 MG tablet Take 1 tablet (500 mg total) by mouth 2 (two) times daily. (Patient not taking: Reported on 12/17/2021) 14 tablet 0   amoxicillin-clavulanate (AUGMENTIN) 500-125 MG tablet Use one dose on days that you have pessary  changed (Patient not taking: Reported on 12/17/2021) 30 tablet 0   celecoxib (CELEBREX) 100 MG capsule Take 1 capsule (100 mg total) by mouth 2 (two) times daily as needed for moderate pain. 60 capsule 0   hydrochlorothiazide (HYDRODIURIL) 12.5 MG tablet TAKE 1/2 TABLET BY MOUTH DAILY AS NEEDED FOR ELEVATED  BLOOD PRESSURE 45 tablet 1   ketoconazole (NIZORAL) 2 % cream Apply 1 application topically 2 (two) times daily as needed (skin irritation/rash.). (Patient not taking: Reported on 12/17/2021)     LORazepam (ATIVAN) 1 MG tablet Take 1 tablet (1 mg  total) by mouth 2 (two) times daily as needed for anxiety. Do not combine with alcohol or any other sedating substances 60 tablet 0   metoprolol succinate (TOPROL-XL) 25 MG 24 hr tablet TAKE ONE TABLET BY MOUTH DAILY 90 tablet 1   NON FORMULARY [facility]  Creams-neuropathy cream (Patient not taking: Reported on 12/17/2021)     NONFORMULARY OR COMPOUNDED ITEM [facility]:  Peripheral Neuropathy - Bupivacaine 1%, Doxepin 3%, Gabapentin 6%, Pentoxifylline 3%, Topiramate 1%. Apply 1-2 grams to affected area 3-4 times daily. (Patient taking differently: Apply 1 application  topically 4 (four) times daily as needed (neuropathy/pain (feet)). [facility]:  Peripheral Neuropathy - Bupivacaine 1%, Doxepin 3%, Gabapentin 6%, Pentoxifylline 3%, Topiramate 1%. Apply 1-2 grams to affected area 3-4 times daily.) 100 each 11   omeprazole (PRILOSEC) 20 MG capsule Take 1 capsule (20 mg total) by mouth daily. 90 capsule 1   pravastatin (PRAVACHOL) 20 MG tablet TAKE ONE TABLET BY MOUTH DAILY 90 tablet 0   pregabalin (LYRICA) 150 MG capsule TAKE 1 CAPSULE BY MOUTH TWICE A DAY 60 capsule 2   rivaroxaban (XARELTO) 10 MG TABS tablet TAKE 1 TABLET BY MOUTH DAILY FOR 3 WEEKS. THEN TAKE 81 MG ASPIRIN ONCE A DAY FOR THREE WEEKS. THEN DISCONTINUE ASPIRIN     traZODone (DESYREL) 50 MG tablet Take 0.5-1 tablets (25-50 mg total) by mouth at bedtime as needed for  sleep. (Patient not taking: Reported on 12/17/2021) 30 tablet 5   No current facility-administered medications on file prior to visit.    BP 136/74   Pulse 66   Resp 18   Ht '5\' 1"'$  (1.549 m)   Wt 170 lb 9.6 oz (77.4 kg)   SpO2 96%   BMI 32.23 kg/m        Objective:   Physical Exam  General Mental Status- Alert. General Appearance- Not in acute distress.   Skin General: Color- Normal Color. Moisture- Normal Moisture.  Neck Carotid Arteries- Normal color. Moisture- Normal Moisture. No carotid bruits. No JVD.  Chest and Lung Exam Auscultation: Breath Sounds:-Normal.  Cardiovascular Auscultation:Rythm- Regular. Murmurs & Other Heart Sounds:Auscultation of the heart reveals- No Murmurs.  Abdomen Inspection:-Inspeection Normal. Palpation/Percussion:Note:No mass. Palpation and Percussion of the abdomen reveal- Non Tender, Non Distended + BS, no rebound or guarding.   Neurologic Cranial Nerve exam:- CN III-XII intact(No nystagmus), symmetric smile. Strength:- 5/5 equal and symmetric strength both upper and lower extremities.   Back- mid lumbar and si tenderness to palpation.  Left hip- mild tendereness to palpation but no pain on rom    Assessment & Plan:   Patient Instructions  Low back pain, sciatica and left hip pain. Get xray of both area.  Can continue tylenol and add on taper medrol 6 day course.   Can try salon pas patch middle to left si area.  Back stretching exercise as tolerated if pain level decreasing.  Follow up 7-10 days or sooner if needed/     Mackie Pai, PA-C

## 2022-04-20 ENCOUNTER — Ambulatory Visit: Payer: Medicare Other | Attending: Nurse Practitioner | Admitting: Nurse Practitioner

## 2022-04-20 ENCOUNTER — Ambulatory Visit (INDEPENDENT_AMBULATORY_CARE_PROVIDER_SITE_OTHER): Payer: Medicare Other | Admitting: Podiatry

## 2022-04-20 ENCOUNTER — Encounter: Payer: Self-pay | Admitting: Nurse Practitioner

## 2022-04-20 VITALS — BP 122/86 | HR 67 | Ht 61.0 in | Wt 168.0 lb

## 2022-04-20 DIAGNOSIS — Z01810 Encounter for preprocedural cardiovascular examination: Secondary | ICD-10-CM | POA: Diagnosis not present

## 2022-04-20 DIAGNOSIS — I10 Essential (primary) hypertension: Secondary | ICD-10-CM | POA: Diagnosis not present

## 2022-04-20 DIAGNOSIS — Z87898 Personal history of other specified conditions: Secondary | ICD-10-CM | POA: Diagnosis not present

## 2022-04-20 DIAGNOSIS — B070 Plantar wart: Secondary | ICD-10-CM

## 2022-04-20 DIAGNOSIS — N1832 Chronic kidney disease, stage 3b: Secondary | ICD-10-CM | POA: Diagnosis not present

## 2022-04-20 NOTE — Patient Instructions (Addendum)
Medication Instructions:  Your physician recommends that you continue on your current medications as directed. Please refer to the Current Medication list given to you today.   *If you need a refill on your cardiac medications before your next appointment, please call your pharmacy*   Lab Work: NONE ordered at this time of appointment   If you have labs (blood work) drawn today and your tests are completely normal, you will receive your results only by: [place] (if you have MyChart) OR A paper copy in the mail If you have any lab test that is abnormal or we need to change your treatment, we will call you to review the results.   Testing/Procedures: NONE ordered at this time of appointment     Follow-Up: At [facility], you and your health needs are our priority.  As part of our continuing mission to provide you with exceptional heart care, we have created designated Provider Care Teams.  These Care Teams include your primary Cardiologist (physician) and Advanced Practice Providers (APPs -  Physician Assistants and Nurse Practitioners) who all work together to provide you with the care you need, when you need it.  We recommend signing up for the patient portal called "MyChart".  Sign up information is provided on this After Visit Summary.  MyChart is used to connect with patients for Virtual Visits (Telemedicine).  Patients are able to view lab/test results, encounter notes, upcoming appointments, etc.  Non-urgent messages can be sent to your provider as well.   To learn more about what you can do with MyChart, go to [email or web address].    Your next appointment:   5-6 month(s)  The format for your next appointment:   In Person  Provider:   Kirk Ruths, MD     Other Instructions   Important Information About Sugar

## 2022-04-20 NOTE — Progress Notes (Signed)
Office Visit    Patient Name: Margaret [name] Date of Encounter: 04/20/2022  Primary Care Provider:  Darreld Mclean, MD Primary Cardiologist:  Kirk Ruths, MD  Chief Complaint    81 year old female with a history of CAD, hypertension, and CKD who presents for follow-up and for preoperative cardiac evaluation.   Past Medical History    Past Medical History:  Diagnosis Date   Anxiety    Arthritis    Cancer ([place])    lt. foot melanoma   Chronic kidney disease    lt. kidney cyst   History of healed stress fracture 2013   bilateral feet   Hx of phlebitis    reports remote hx of "superfical blood clots" never anticoagulated   Hypertension    Osteopenia    Past Surgical History:  Procedure Laterality Date   CHOLECYSTECTOMY N/A 09/19/2016   Procedure: LAPAROSCOPIC CHOLECYSTECTOMY POSSIBLE INTRAOPERATIVE CHOLANGIOGRAM;  Surgeon: Rolm Bookbinder, MD;  Location: [place];  Service: General;  Laterality: N/A;   ERCP N/A 09/18/2016   Procedure: ENDOSCOPIC RETROGRADE CHOLANGIOPANCREATOGRAPHY (ERCP);  Surgeon: Doran Stabler, MD;  Location: [facility] ENDOSCOPY;  Service: Endoscopy;  Laterality: N/A;   FOOT SURGERY     patient reports four foot surgeries   FOOT SURGERY Left    bunion, hammer toe surgery   FOOT SURGERY Right    shaved bunion, hammer toe correction   KNEE ARTHROSCOPY Right 1997   REPLACEMENT TOTAL KNEE Left    REPLACEMENT TOTAL KNEE Left 10/2012   RETINAL DETACHMENT SURGERY     RETINAL DETACHMENT SURGERY Left 2008   THYROIDECTOMY, PARTIAL Right    THYROIDECTOMY, PARTIAL  2005   TOTAL KNEE ARTHROPLASTY Right 11/13/2019   Procedure: TOTAL KNEE ARTHROPLASTY SDDC;  Surgeon: Gaynelle Arabian, MD;  Location: [facility];  Service: Orthopedics;  Laterality: Right;  22mn    Allergies  Allergies  Allergen Reactions   Keflex [Cephalexin] Diarrhea   Percocet [Oxycodone-Acetaminophen] Other (See Comments)    Syncope (passed out)   Clindamycin/Lincomycin Rash    History  of Present Illness    81year old female with the above past medical history including syncope, hypertension, and CKD.  She has a history of syncope.  She was evaluated by neurology. There was concern for seizure, however, EEG was unremarkable.  MRI showed no significant abnormality.  It was previously felt that her symptoms were likely orthostatic mediated with component of increased vagal tone.  Hydrochlorothiazide was discontinued and she was asked to increase her hydration and sodium intake and to use compression stockings.  Echocardiogram in November 2021 showed normal LV function, G1 DD, moderate left atrial enlargement, mild mitral valve regurgitation, trace aortic valve regurgitation.  She was last seen in the office on 01/23/2021 and was stable from a cardiac standpoint.  She denied any recurrent syncope.  Permissive hypertension was recommended in the setting of orthostatic symptoms.  She presents today for follow-up accompanied by her granddaughter and son and for preoperative cardiac evaluation for partial nephrectomy and ventral hernia repair on 06/03/2021 with Dr. [facility].  As her last visit she has been stable from a cardiac standpoint.  She denies any syncope, presyncope.  Her BP has been well controlled.  She is a bit anxious about having to undergo surgery.  Otherwise, she reports feeling well.  Home Medications    Current Outpatient Medications  Medication Sig Dispense Refill   acetaminophen (TYLENOL) 500 MG tablet Take 1,000 mg by mouth every 6 (six) hours  as needed (pain.).      hydrochlorothiazide (HYDRODIURIL) 12.5 MG tablet TAKE 1/2 TABLET BY MOUTH DAILY AS NEEDED FOR ELEVATED  BLOOD PRESSURE 45 tablet 1   LORazepam (ATIVAN) 1 MG tablet Take 1 tablet (1 mg total) by mouth 2 (two) times daily as needed for anxiety. Do not combine with alcohol or any other sedating substances 60 tablet 0   metoprolol succinate (TOPROL-XL) 25 MG 24 hr tablet TAKE ONE TABLET  BY MOUTH DAILY 90 tablet 1   omeprazole (PRILOSEC) 20 MG capsule Take 1 capsule (20 mg total) by mouth daily. 90 capsule 1   pravastatin (PRAVACHOL) 20 MG tablet TAKE ONE TABLET BY MOUTH DAILY 90 tablet 0   pregabalin (LYRICA) 150 MG capsule TAKE 1 CAPSULE BY MOUTH TWICE A DAY 60 capsule 2   methylPREDNISolone (MEDROL) 4 MG tablet Standard 6 day taper dose (Patient not taking: Reported on 04/20/2022) 21 tablet 0   NONFORMULARY OR COMPOUNDED ITEM [facility]:  Peripheral Neuropathy - Bupivacaine 1%, Doxepin 3%, Gabapentin 6%, Pentoxifylline 3%, Topiramate 1%. Apply 1-2 grams to affected area 3-4 times daily. (Patient taking differently: Apply 1 application  topically 4 (four) times daily as needed (neuropathy/pain (feet)). [facility]:  Peripheral Neuropathy - Bupivacaine 1%, Doxepin 3%, Gabapentin 6%, Pentoxifylline 3%, Topiramate 1%. Apply 1-2 grams to affected area 3-4 times daily.) 100 each 11   rivaroxaban (XARELTO) 10 MG TABS tablet TAKE 1 TABLET BY MOUTH DAILY FOR 3 WEEKS. THEN TAKE 81 MG ASPIRIN ONCE A DAY FOR THREE WEEKS. THEN DISCONTINUE ASPIRIN     No current facility-administered medications for this visit.     Review of Systems    She denies chest pain, palpitations, dyspnea, pnd, orthopnea, n, v, dizziness, syncope, edema, weight gain, or early satiety. All other systems reviewed and are otherwise negative except as noted above.   Physical Exam    VS:  BP 122/86   Pulse 67   Ht '5\' 1"'$  (1.549 m)   Wt 168 lb (76.2 kg)   SpO2 96%   BMI 31.74 kg/m   GEN: Well nourished, well developed, in no acute distress. HEENT: normal. Neck: Supple, no JVD, carotid bruits, or masses. Cardiac: RRR, no murmurs, rubs, or gallops. No clubbing, cyanosis, edema.  Radials/DP/PT 2+ and equal bilaterally.  Respiratory:  Respirations regular and unlabored, clear to auscultation bilaterally. GI: Soft, nontender, nondistended, BS + x 4. MS: no deformity or atrophy. Skin: warm and dry, no  rash. Neuro:  Strength and sensation are intact. Psych: Normal affect.  Accessory Clinical Findings    ECG personally reviewed by me today -NSR, 60 bpm, LAD- no acute changes.   Lab Results  Component Value Date   WBC 5.9 09/29/2021   HGB 12.8 09/29/2021   HCT 38.6 09/29/2021   MCV 97.1 09/29/2021   PLT 196.0 09/29/2021   Lab Results  Component Value Date   CREATININE 1.40 (H) 09/29/2021   BUN 26 (H) 09/29/2021   NA 139 09/29/2021   K 4.0 09/29/2021   CL 101 09/29/2021   CO2 31 09/29/2021   Lab Results  Component Value Date   ALT 15 04/07/2021   AST 21 04/07/2021   ALKPHOS 71 04/07/2021   BILITOT 0.4 04/07/2021   Lab Results  Component Value Date   CHOL 166 04/07/2021   HDL 61.30 04/07/2021   LDLCALC 118 (H) 04/03/2020   LDLDIRECT 71.0 04/07/2021   TRIG 260.0 (H) 04/07/2021   CHOLHDL 3 04/07/2021    Lab Results  Component Value Date   HGBA1C 5.1 09/29/2021    Assessment & Plan   1. History of syncope: Evaluated by neurology. There was concern for seizure, however, EEG was unremarkable.  MRI showed no significant abnormality.  It was previously felt that her symptoms were likely orthostatic mediated with component of increased vagal tone.  Denies any recurrent syncope.  Stable.  2. Hypertension: BP well controlled. Continue current antihypertensive regimen.   3. CKD stage IIIb: Creatinine was 1.1 in 01/2022. Stable.   4. Preoperative cardiac exam: According to the Revised Cardiac Risk Index (RCRI), her Perioperative Risk of Major Cardiac Event is (%): 0.4. Her Functional Capacity in METs is: 5.07 according to the Duke Activity Status Index (DASI). Therefore, based on ACC/AHA guidelines, patient would be at acceptable risk for the planned procedure without further cardiovascular testing. I will route this recommendation to the requesting party via Epic fax function.  5. Disposition: Follow-up in  5-6 months with Dr. Stanford Breed.      Lenna Sciara, NP 04/20/2022,  12:34 PM

## 2022-04-22 ENCOUNTER — Ambulatory Visit: Payer: Medicare Other | Admitting: Cardiology

## 2022-04-23 NOTE — Patient Instructions (Addendum)
Good to see you again today!  If not done already recommend the latest covid booster and dose of RSV I will be in touch with your labs asap Best of luck with your operation- I think you will do well!   Let's visit in 6 months

## 2022-04-23 NOTE — Progress Notes (Addendum)
[place] [facility] [address], [place], [place] [address] [phone]  Date:  04/27/2022   Name:  Margaret [name]   DOB:  Oct 17, 1940   MRN:  [id number]  PCP:  Darreld Mclean, MD    Chief Complaint: 7-10 day f/u (Low back pain, sciatica Per plan at last OV: Can continue tylenol and add on taper medrol 6 day course. Advised salon pas patch and Back stretching. Pt says pain is better today. /)   History of Present Illness:  Margaret [name] is a 81 y.o. very pleasant female patient who presents with the following:  Seen today for a recheck visit  Last seen by myself in May of this year - history of hypertension, GERD, osteopenia, melanoma of left foot, renal insufficiency, gallstone pancreatitis status post cholecystectomy   We had been working on tapering her lorazepam - I filled #60 10 days ago and #60 back in June   She was seen by cardiology earlier this month: 1. History of syncope: Evaluated by neurology. There was concern for seizure, however, EEG was unremarkable.  MRI showed no significant abnormality.  It was previously felt that her symptoms were likely orthostatic mediated with component of increased vagal tone.  Denies any recurrent syncope.  Stable. 2. Hypertension: BP well controlled. Continue current antihypertensive regimen.  3. CKD stage IIIb: Creatinine was 1.1 in 01/2022. Stable.  4. Preoperative cardiac exam: According to the Revised Cardiac Risk Index (RCRI), her Perioperative Risk of Major Cardiac Event is (%): 0.4. Her Functional Capacity in METs is: 5.07 according to the [facility] Activity Status Index (DASI). Therefore, based on ACC/AHA guidelines, patient would be at acceptable risk for the planned procedure without further cardiovascular testing. I will route this recommendation to the requesting party via Epic fax function. 5. Disposition: Follow-up in  5-6 months with Dr. Stanford Breed.    Dr Tresa Moore plans  to do a partial nephrectomy in January - she does have a cancerous lesion in her kidney but we hope after her surgery her treatment will be complete  She has been cleared for surgery by cardiology  Sur[facility] Flu shot- done already  Covid booster Labs done in May - BMP, CBC and A1c Otherwise can update labs   I had referred her to nephrology but she got sick and had to cancel her appt; she is having a hard time getting back in  Patient Active Problem List   Diagnosis Date Noted   Open wound of left side of back 01/20/2021   [place] 11/18/2020   Lipoma of back 11/18/2020   Lymphedema 11/18/2020   Melanocytic nevi of right upper limb, including shoulder 11/18/2020   Personal history of malignant melanoma of skin 11/18/2020   Sebaceous cyst 11/18/2020   Seborrheic dermatitis 11/18/2020   Pre-diabetes 04/03/2020   OA (osteoarthritis) of knee 11/13/2019   Total knee replacement status, 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   Pain in right knee 08/03/2019   Fluid collection at surgical site 10/20/2018   Encounter for postoperative examination after surgery for malignant neoplasm 09/30/2018   Malignant melanoma of left foot ([place]) 08/15/2018   Plantar fasciitis of right foot 02/03/2017   Porokeratosis 02/03/2017   Acute pancreatitis due to calculus of common bile duct 09/17/2016   Acute gallstone pancreatitis    Calculus of gallbladder with acute cholecystitis and obstruction    LFT elevation    Stress fracture 01/01/2016   Insomnia  12/16/2015   Peripheral neuropathy 10/18/2015   Renal insufficiency 03/06/2015   HTN (hypertension) 03/06/2015   Osteopenia 03/06/2015   GERD (gastroesophageal reflux disease) 03/06/2015   Vaginal pessary present 03/06/2015   Metatarsalgia 07/13/2012   Increased frequency of urination 04/13/2012   DDD (degenerative disc disease), cervical 01/19/2012   Knee stiffness 01/19/2012   Low back pain 01/19/2012   Neck pain 01/19/2012   Pain of hand 01/19/2012    Hallux valgus 09/20/2011   Hyperkeratosis 08/13/2011   Anxiety disorder 07/06/2011   Depression 07/06/2011   Diverticulosis of large intestine 07/06/2011   Non-toxic multinodular goiter 07/06/2011   Atrophic vaginitis 10/24/2010   Dermatographic urticaria 09/17/2010   Inflamed seborrheic keratosis 09/17/2010   Closed fracture of fifth metatarsal bone 07/10/2010   Diagnosis unknown 02/27/2010   Hip pain 06/08/2008   Pulmonary nodule seen on imaging study 06/08/2008    Past Medical History:  Diagnosis Date   Anxiety    Arthritis    Cancer ([place])    lt. foot melanoma   Chronic kidney disease    lt. kidney cyst   History of healed stress fracture 2013   bilateral feet   Hx of phlebitis    reports remote hx of "superfical blood clots" never anticoagulated   Hypertension    Osteopenia     Past Surgical History:  Procedure Laterality Date   CHOLECYSTECTOMY N/A 09/19/2016   Procedure: LAPAROSCOPIC CHOLECYSTECTOMY POSSIBLE INTRAOPERATIVE CHOLANGIOGRAM;  Surgeon: Rolm Bookbinder, MD;  Location: [facility];  Service: General;  Laterality: N/A;   ERCP N/A 09/18/2016   Procedure: ENDOSCOPIC RETROGRADE CHOLANGIOPANCREATOGRAPHY (ERCP);  Surgeon: Doran Stabler, MD;  [facility] END[facility]COPY;  Service: Endoscopy;  Laterality: N/A;   FOOT SURGERY     patient reports four foot surgeries   FOOT SURGERY Left    bunion, hammer toe surgery   FOOT SURGERY Right    shaved bunion, hammer toe correction   KNEE ARTHROSCOPY Right 1997   REPLACEMENT TOTAL KNEE Left    REPLACEMENT TOTAL KNEE Left 10/2012   RETINAL DETACHMENT SURGERY     RETINAL DETACHMENT SURGERY Left 2008   THYROIDECTOMY, PARTIAL Right    THYROIDECTOMY, PARTIAL  2005   TOTAL KNEE ARTHROPLASTY Right 11/13/2019   Procedure: TOTAL KNEE ARTHROPLASTY SDDC;  Surgeon: Gaynelle Arabian, MD;  Location: [facility];  Service: Orthopedics;  Laterality: Right;  82mn    Social History   Tobacco Use   Smoking status: Former    Types: Cigarettes     Quit date: 06/02/1979    Years since quitting: 42.9   Smokeless tobacco: Never  Vaping Use   Vaping Use: Never used  Substance Use Topics   Alcohol use: Yes    Alcohol/week: 1.0 standard drink of alcohol    Types: 1 Glasses of wine per week    Comment: Occ   Drug use: No    Family History  Problem Relation Age of Onset   Heart disease Mother    Heart attack Mother    Heart disease Father    Diabetes Maternal Grandmother    Cancer Paternal Grandfather        stomach    Allergies  Allergen Reactions   Keflex [Cephalexin] Diarrhea   Percocet [Oxycodone-Acetaminophen] Other (See Comments)    Syncope (passed out)   Clindamycin/Lincomycin Rash    Medication list has been reviewed and updated.  Current Outpatient Medications on File Prior to Visit  Medication Sig Dispense Refill   acetaminophen (TYLENOL) 500 MG tablet  Take 1,000 mg by mouth every 6 (six) hours as needed (pain.).      LORazepam (ATIVAN) 1 MG tablet Take 1 tablet (1 mg total) by mouth 2 (two) times daily as needed for anxiety. Do not combine with alcohol or any other sedating substances 60 tablet 0   metoprolol succinate (TOPROL-XL) 25 MG 24 hr tablet Take 1 tablet (25 mg total) by mouth daily. 90 tablet 0   NONFORMULARY OR COMPOUNDED ITEM [facility]:  Peripheral Neuropathy - Bupivacaine 1%, Doxepin 3%, Gabapentin 6%, Pentoxifylline 3%, Topiramate 1%. Apply 1-2 grams to affected area 3-4 times daily. (Patient taking differently: Apply 1 application  topically 4 (four) times daily as needed (neuropathy/pain (feet)). [facility]:  Peripheral Neuropathy - Bupivacaine 1%, Doxepin 3%, Gabapentin 6%, Pentoxifylline 3%, Topiramate 1%. Apply 1-2 grams to affected area 3-4 times daily.) 100 each 11   omeprazole (PRILOSEC) 20 MG capsule Take 1 capsule (20 mg total) by mouth daily. 90 capsule 1   pravastatin (PRAVACHOL) 20 MG tablet TAKE ONE TABLET BY MOUTH DAILY 90 tablet 0   pregabalin (LYRICA) 150 MG  capsule TAKE 1 CAPSULE BY MOUTH TWICE A DAY 60 capsule 2   No current facility-administered medications on file prior to visit.    Review of Systems:  As per HPI- otherwise negative.   Physical Examination: Vitals:   04/27/22 1323  BP: 120/60  Pulse: 74  Resp: 18  Temp: 97.8 F (36.6 C)  SpO2: 96%   Vitals:   04/27/22 1323  Weight: 171 lb 12.8 oz (77.9 kg)  Height: '5\' 1"'$  (1.549 m)   Body mass index is 32.46 kg/m. Ideal Body Weight: Weight in (lb) to have BMI = 25: 132  GEN: no acute distress. HEENT: Atraumatic, Normocephalic.  Ears and Nose: No external deformity. CV: RRR, No M/G/R. No JVD. No thrill. No extra heart sounds. PULM: CTA B, no wheezes, crackles, rhonchi. No retractions. No resp. distress. No accessory muscle use. ABD: S, NT, ND, +BS. No rebound. No HSM. EXTR: No c/c/e PSYCH: Normally interactive. Conversant.    Assessment and Plan: Pre-diabetes - Plan: Hemoglobin A1c  Chronic renal impairment, stage 3a (HCC) - Plan: CBC, Comprehensive metabolic panel  Primary hypertension  Dyslipidemia - Plan: Lipid panel  Fatigue, unspecified type - Plan: TSH  Essential hypertension - Plan: hydrochlorothiazide (HYDRODIURIL) 12.5 MG tablet  Pt taking 6.25 HCTZ daily with good control of her BP Labs pending as above Recommended needed immun Pt notes she had to cancel her nephrology appt and is having a hard time getting back in.  Will check on her renal function today to see if appt is necessary  Signed Lamar Blinks, MD  Received labs as below, message to pt Results for orders placed or performed in visit on 04/27/22  CBC  Result Value Ref Range   WBC 6.6 4.0 - 10.5 K/uL   RBC 4.35 3.87 - 5.11 Mil/uL   Platelets 207.0 150.0 - 400.0 K/uL   Hemoglobin 14.0 12.0 - 15.0 g/dL   HCT 41.9 36.0 - 46.0 %   MCV 96.3 78.0 - 100.0 fl   MCHC 33.3 30.0 - 36.0 g/dL   RDW 14.4 11.5 - 15.5 %  Comprehensive metabolic panel  Result Value Ref Range   Sodium 138 135  - 145 mEq/L   Potassium 3.8 3.5 - 5.1 mEq/L   Chloride 100 96 - 112 mEq/L   CO2 30 19 - 32 mEq/L   Glucose, Bld 102 (H) 70 - 99 mg/dL  BUN 23 6 - 23 mg/dL   Creatinine, Ser 1.04 0.40 - 1.20 mg/dL   Total Bilirubin 0.4 0.2 - 1.2 mg/dL   Alkaline Phosphatase 72 39 - 117 U/L   AST 21 0 - 37 U/L   ALT 16 0 - 35 U/L   Total Protein 6.7 6.0 - 8.3 g/dL   Albumin 4.2 3.5 - 5.2 g/dL   GFR 50.34 (L) >60.00 mL/min   Calcium 9.2 8.4 - 10.5 mg/dL  Hemoglobin A1c  Result Value Ref Range   Hgb A1c MFr Bld 6.0 4.6 - 6.5 %  Lipid panel  Result Value Ref Range   Cholesterol 176 0 - 200 mg/dL   Triglycerides 226.0 (H) 0.0 - 149.0 mg/dL   HDL 69.50 >39.00 mg/dL   VLDL 45.2 (H) 0.0 - 40.0 mg/dL   Total CHOL/HDL Ratio 3    NonHDL 106.77   TSH  Result Value Ref Range   TSH 1.04 0.35 - 5.50 uIU/mL  LDL cholesterol, direct  Result Value Ref Range   Direct LDL 79.0 mg/dL

## 2022-04-26 ENCOUNTER — Other Ambulatory Visit: Payer: Self-pay | Admitting: Family Medicine

## 2022-04-26 DIAGNOSIS — I10 Essential (primary) hypertension: Secondary | ICD-10-CM

## 2022-04-27 ENCOUNTER — Ambulatory Visit (INDEPENDENT_AMBULATORY_CARE_PROVIDER_SITE_OTHER): Payer: Medicare Other | Admitting: Family Medicine

## 2022-04-27 ENCOUNTER — Encounter: Payer: Self-pay | Admitting: Family Medicine

## 2022-04-27 VITALS — BP 120/60 | HR 74 | Temp 97.8°F | Resp 18 | Ht 61.0 in | Wt 171.8 lb

## 2022-04-27 DIAGNOSIS — E785 Hyperlipidemia, unspecified: Secondary | ICD-10-CM

## 2022-04-27 DIAGNOSIS — I10 Essential (primary) hypertension: Secondary | ICD-10-CM

## 2022-04-27 DIAGNOSIS — R5383 Other fatigue: Secondary | ICD-10-CM

## 2022-04-27 DIAGNOSIS — R7303 Prediabetes: Secondary | ICD-10-CM | POA: Diagnosis not present

## 2022-04-27 DIAGNOSIS — N1831 Chronic kidney disease, stage 3a: Secondary | ICD-10-CM

## 2022-04-27 LAB — CBC
HCT: 41.9 % (ref 36.0–46.0)
Hemoglobin: 14 g/dL (ref 12.0–15.0)
MCHC: 33.3 g/dL (ref 30.0–36.0)
MCV: 96.3 fl (ref 78.0–100.0)
Platelets: 207 10*3/uL (ref 150.0–400.0)
RBC: 4.35 Mil/uL (ref 3.87–5.11)
RDW: 14.4 % (ref 11.5–15.5)
WBC: 6.6 10*3/uL (ref 4.0–10.5)

## 2022-04-27 LAB — COMPREHENSIVE METABOLIC PANEL
ALT: 16 U/L (ref 0–35)
AST: 21 U/L (ref 0–37)
Albumin: 4.2 g/dL (ref 3.5–5.2)
Alkaline Phosphatase: 72 U/L (ref 39–117)
BUN: 23 mg/dL (ref 6–23)
CO2: 30 mEq/L (ref 19–32)
Calcium: 9.2 mg/dL (ref 8.4–10.5)
Chloride: 100 mEq/L (ref 96–112)
Creatinine, Ser: 1.04 mg/dL (ref 0.40–1.20)
GFR: 50.34 mL/min — ABNORMAL LOW (ref >60.00)
Glucose, Bld: 102 mg/dL — ABNORMAL HIGH (ref 70–99)
Potassium: 3.8 mEq/L (ref 3.5–5.1)
Sodium: 138 mEq/L (ref 135–145)
Total Bilirubin: 0.4 mg/dL (ref 0.2–1.2)
Total Protein: 6.7 g/dL (ref 6.0–8.3)

## 2022-04-27 LAB — LIPID PANEL
Cholesterol: 176 mg/dL (ref 0–200)
HDL: 69.5 mg/dL (ref >39.00)
NonHDL: 106.77
Total CHOL/HDL Ratio: 3
Triglycerides: 226 mg/dL — ABNORMAL HIGH (ref 0.0–149.0)
VLDL: 45.2 mg/dL — ABNORMAL HIGH (ref 0.0–40.0)

## 2022-04-27 LAB — TSH: TSH: 1.04 u[IU]/mL (ref 0.35–5.50)

## 2022-04-27 LAB — LDL CHOLESTEROL, DIRECT: Direct LDL: 79 mg/dL

## 2022-04-27 LAB — HEMOGLOBIN A1C: Hgb A1c MFr Bld: 6 % (ref 4.6–6.5)

## 2022-04-27 MED ORDER — HYDROCHLOROTHIAZIDE 12.5 MG PO TABS: 6.2500 mg | ORAL_TABLET | Freq: Every day | ORAL | 3 refills | Status: DC | PRN

## 2022-04-27 NOTE — Progress Notes (Signed)
Subjective: Chief Complaint  Patient presents with   Plantar Warts    Left foot plantar wart    81 year old female presents For Further Evaluation.  She states she is doing well.  Says medication has been helpful but she was to hold on this today as she is only on her feet a lot for the upcoming holiday.   Objective: General: No acute distress, AAOx3  DP/PT pulses palpable 2/4, CRT < 3 sec to all digits.   Sensation decreased with Semmes Weinstein monofilament Right foot: Annual hyperkeratotic lesion lateral plantar foot.  No underlying ulceration drainage or signs of infection. Left foot: Punctate annular hyperkeratotic lesions noted left foot metatarsal, plantar midfoot left side as well as the right fifth metatarsal base and submetatarsal area.  No ongoing ulceration drainage or signs of infection. Lesions to be somewhat smaller today but still causing discomfort but then seem to be somewhat improved. Prominent metatarsal heads with digital deformity. No pain with calf compression, swelling, warmth, erythema.   Assessment and Plan:  Chronic skin lesions bilaterally, neuropathy  -Sharply debrided calluses x 3 without any complications or bleeding.  I held off on the [place] application today.  Will likely reapply next appointment.  Continue moisturizer, offloading.  -Monitor for any clinical signs or symptoms of infection and directed to call the office immediately should any occur or go to the ER.  Return in about 3 weeks (around 05/11/2022).  Trula Slade DPM

## 2022-04-28 ENCOUNTER — Other Ambulatory Visit: Payer: Self-pay

## 2022-04-28 MED ORDER — PREGABALIN 150 MG PO CAPS: 150.0000 mg | ORAL_CAPSULE | Freq: Two times a day (BID) | ORAL | 2 refills | Status: DC

## 2022-04-29 ENCOUNTER — Encounter: Payer: Self-pay | Admitting: Family Medicine

## 2022-04-29 ENCOUNTER — Other Ambulatory Visit: Payer: Self-pay | Admitting: Family Medicine

## 2022-04-29 ENCOUNTER — Telehealth: Payer: Self-pay | Admitting: Family Medicine

## 2022-04-29 DIAGNOSIS — R300 Dysuria: Secondary | ICD-10-CM

## 2022-04-29 DIAGNOSIS — H40003 Preglaucoma, unspecified, bilateral: Secondary | ICD-10-CM | POA: Diagnosis not present

## 2022-04-29 MED ORDER — AMOXICILLIN-POT CLAVULANATE 500-125 MG PO TABS: 1.0000 | ORAL_TABLET | Freq: Two times a day (BID) | ORAL | 0 refills | Status: DC

## 2022-04-29 NOTE — Progress Notes (Signed)
Called patient, she states she has her typical mild UTI symptoms with dysuria and frequency.  She does not drive, states there is no one to bring her in to clinic tomorrow to drop off a urine sample.  In this case we will go ahead and treat her with antibiotics as her best option.  However, she is cautioned to please let me know if not getting better in the next day or 2.  Prescribed Augmentin 500 twice daily for 5 days, she tolerated this well earlier this year

## 2022-04-29 NOTE — Telephone Encounter (Signed)
Pt stated she has a uti, but was just seen. She would like to know if pcp can just send it what she usually does as sxs are the same as always. Please advise.     [facility] [id number] [name], [place] - [address] [place], [place] [address] Phone: [phone]  Fax: [phone]

## 2022-04-29 NOTE — Telephone Encounter (Signed)
Is this something that you usually do for her? Please advise.

## 2022-05-11 ENCOUNTER — Ambulatory Visit (INDEPENDENT_AMBULATORY_CARE_PROVIDER_SITE_OTHER): Payer: Medicare Other | Admitting: Podiatry

## 2022-05-11 ENCOUNTER — Other Ambulatory Visit: Payer: Self-pay | Admitting: Family Medicine

## 2022-05-11 DIAGNOSIS — B070 Plantar wart: Secondary | ICD-10-CM

## 2022-05-11 DIAGNOSIS — K219 Gastro-esophageal reflux disease without esophagitis: Secondary | ICD-10-CM

## 2022-05-11 DIAGNOSIS — Q828 Other specified congenital malformations of skin: Secondary | ICD-10-CM | POA: Diagnosis not present

## 2022-05-15 NOTE — Progress Notes (Signed)
COVID Vaccine Completed:  Yes  Date of COVID positive in last 47 days:No  PCP - Lamar Blinks, MD Cardiologist - Kirk Ruths, MD Neurologist -  Ellouise Newer, MD  Cardiac clearance in Epic dated 04-20-22 by Diona Browner, NP  Chest x-ray - N/A EKG - 04-20-22 Epic Stress Test - N/A ECHO - 04-16-20 Epic Cardiac Cath - N/A Pacemaker/ICD device last checked: Spinal Cord Stimulator:N/A Event Monitor - 2018 Epic  Bowel Prep - Clear liquid diet the day before  Mag Citrate at noon day before surgery  Sleep Study - N/A CPAP -   Fasting Blood Sugar - N/A Checks Blood Sugar _____ times a day  Last dose of GLP1 agonist-  N/A GLP1 instructions:  N/A   Last dose of SGLT-2 inhibitors-  N/A SGLT-2 instructions: N/A  Blood Thinner Instructions:N/A Aspirin Instructions: Last Dose:  Activity level:  Can go up a flight of stairs and perform activities of daily living without stopping and without symptoms of chest pain or shortness of breath.  Anesthesia review:  CAD, HTN, CKD, hx of syncope  Patient denies shortness of breath, fever, cough and chest pain at PAT appointment  Patient verbalized understanding of instructions that were given to them at the PAT appointment. Patient was also instructed that they will need to review over the PAT instructions again at home before surgery.

## 2022-05-15 NOTE — Patient Instructions (Addendum)
SURGICAL WAITING ROOM VISITATION Patients having surgery or a procedure may have no more than 2 support people in the waiting area - these visitors may rotate.   Children under the age of 3 must have an adult with them who is not the patient. If the patient needs to stay at the hospital during part of their recovery, the visitor guidelines for inpatient rooms apply. Pre-op nurse will coordinate an appropriate time for 1 support person to accompany patient in pre-op.  This support person may not rotate.    Please refer to the [facility] website for the visitor guidelines for Inpatients (after your surgery is over and you are in a regular room).   Due to an increase in RSV and influenza rates and associated hospitalizations, children ages 73 and under may not visit patients in [place].     Your procedure is scheduled on: 06-03-22   Report to [facility] Main Entrance    Report to admitting at 6:15 AM   Call this number if you have problems the morning of surgery [phone]   Follow a clear liquid diet the day before surgery    Do not eat food or drink liquids:After Midnight.          If you have questions, please contact your surgeon's office.   FOLLOW BOWEL PREP AND ANY ADDITIONAL PRE OP INSTRUCTIONS YOU RECEIVED FROM YOUR SURGEON'S OFFICE!!!  -Clear liquid diet the day before surgery  -Magnesium Citrate at noon the day before surgery      Oral Hygiene is also important to reduce your risk of infection.                                    Remember - BRUSH YOUR TEETH THE MORNING OF SURGERY WITH YOUR REGULAR TOOTHPASTE   Do NOT smoke after Midnight   Take these medicines the morning of surgery with A SIP OF WATER:   Augmentin  Lorazepam  Metoprolol  Omeprazole  Pravastatin  Pregabalin  Tylenol if needed                              You may not have any metal on your body including hair pins, jewelry, and body piercing             Do not wear  make-up, lotions, powders, perfumes or deodorant  Do not wear nail polish including gel and S&S, artificial/acrylic nails, or any other type of covering on natural nails including finger and toenails. If you have artificial nails, gel coating, etc. that needs to be removed by a nail salon please have this removed prior to surgery or surgery may need to be canceled/ delayed if the surgeon/ anesthesia feels like they are unable to be safely monitored.   Do not shave  48 hours prior to surgery.         Do not bring valuables to the hospital. [place].   Contacts, dentures or bridgework may not be worn into surgery.   Bring small overnight bag day of surgery.   DO NOT [place]. PHARMACY WILL DISPENSE MEDICATIONS LISTED ON YOUR MEDICATION LIST TO YOU DURING YOUR ADMISSION [facility]!              Please read over the  following fact sheets you were given: IF YOU HAVE QUESTIONS ABOUT YOUR PRE-OP INSTRUCTIONS PLEASE CALL [phone] [place]  If you received a COVID test during your pre-op visit  it is requested that you wear a mask when out in public, stay away from anyone that may not be feeling well and notify your surgeon if you develop symptoms. If you test positive for Covid or have been in contact with anyone that has tested positive in the last 10 days please notify you surgeon.  [facility] - Preparing for Surgery Before surgery, you can play an important role.  Because skin is not sterile, your skin needs to be as free of germs as possible.  You can reduce the number of germs on your skin by washing with CHG (chlorahexidine gluconate) soap before surgery.  CHG is an antiseptic cleaner which kills germs and bonds with the skin to continue killing germs even after washing. Please DO NOT use if you have an allergy to CHG or antibacterial soaps.  If your skin becomes reddened/irritated stop using the CHG and inform your  nurse when you arrive at Short Stay. Do not shave (including legs and underarms) for at least 48 hours prior to the first CHG shower.  You may shave your face/neck.  Please follow these instructions carefully:  1.  Shower with CHG Soap the night before surgery and the  morning of surgery.  2.  If you choose to wash your hair, wash your hair first as usual with your normal  shampoo.  3.  After you shampoo, rinse your hair and body thoroughly to remove the shampoo.                             4.  Use CHG as you would any other liquid soap.  You can apply chg directly to the skin and wash.  Gently with a scrungie or clean washcloth.  5.  Apply the CHG Soap to your body ONLY FROM THE NECK DOWN.   Do   not use on face/ open                           Wound or open sores. Avoid contact with eyes, ears mouth and   genitals (private parts).                       Wash face,  Genitals (private parts) with your normal soap.             6.  Wash thoroughly, paying special attention to the area where your    surgery  will be performed.  7.  Thoroughly rinse your body with warm water from the neck down.  8.  DO NOT shower/wash with your normal soap after using and rinsing off the CHG Soap.                9.  Pat yourself dry with a clean towel.            10.  Wear clean pajamas.            11.  Place clean sheets on your bed the night of your first shower and do not  sleep with pets. Day of Surgery : Do not apply any lotions/deodorants the morning of surgery.  Please wear clean clothes to the hospital/surgery center.  FAILURE TO FOLLOW THESE INSTRUCTIONS MAY  RESULT IN THE CANCELLATION OF YOUR SURGERY  PATIENT SIGNATURE_________________________________  NURSE SIGNATURE__________________________________  ________________________________________________________________________   WHAT IS A BLOOD TRANSFUSION? Blood Transfusion Information  A transfusion is the replacement of blood or some of its parts.  Blood is made up of multiple cells which provide different functions. Red blood cells carry oxygen and are used for blood loss replacement. White blood cells fight against infection. Platelets control bleeding. Plasma helps clot blood. Other blood products are available for specialized needs, such as hemophilia or other clotting disorders. BEFORE THE TRANSFUSION  Who gives blood for transfusions?  Healthy volunteers who are fully evaluated to make sure their blood is safe. This is blood bank blood. Transfusion therapy is the safest it has ever been in the practice of medicine. Before blood is taken from a donor, a complete history is taken to make sure that person has no history of diseases nor engages in risky social behavior (examples are intravenous drug use or sexual activity with multiple partners). The donor's travel history is screened to minimize risk of transmitting infections, such as malaria. The donated blood is tested for signs of infectious diseases, such as HIV and hepatitis. The blood is then tested to be sure it is compatible with you in order to minimize the chance of a transfusion reaction. If you or a relative donates blood, this is often done in anticipation of surgery and is not appropriate for emergency situations. It takes many days to process the donated blood. RISKS AND COMPLICATIONS Although transfusion therapy is very safe and saves many lives, the main dangers of transfusion include:  Getting an infectious disease. Developing a transfusion reaction. This is an allergic reaction to something in the blood you were given. Every precaution is taken to prevent this. The decision to have a blood transfusion has been considered carefully by your caregiver before blood is given. Blood is not given unless the benefits outweigh the risks. AFTER THE TRANSFUSION Right after receiving a blood transfusion, you will usually feel much better and more energetic. This is especially true if  your red blood cells have gotten low (anemic). The transfusion raises the level of the red blood cells which carry oxygen, and this usually causes an energy increase. The nurse administering the transfusion will monitor you carefully for complications. HOME CARE INSTRUCTIONS  No special instructions are needed after a transfusion. You may find your energy is better. Speak with your caregiver about any limitations on activity for underlying diseases you may have. SEEK MEDICAL CARE IF:  Your condition is not improving after your transfusion. You develop redness or irritation at the intravenous (IV) site. SEEK IMMEDIATE MEDICAL CARE IF:  Any of the following symptoms occur over the next 12 hours: Shaking chills. You have a temperature by mouth above 102 F (38.9 C), not controlled by medicine. Chest, back, or muscle pain. People around you feel you are not acting correctly or are confused. Shortness of breath or difficulty breathing. Dizziness and fainting. You get a rash or develop hives. You have a decrease in urine output. Your urine turns a dark color or changes to pink, red, or brown. Any of the following symptoms occur over the next 10 days: You have a temperature by mouth above 102 F (38.9 C), not controlled by medicine. Shortness of breath. Weakness after normal activity. The white part of the eye turns yellow (jaundice). You have a decrease in the amount of urine or are urinating less often. Your urine turns a dark color  or changes to pink, red, or brown. Document Released: 05/15/2000 Document Revised: 08/10/2011 Document Reviewed: 01/02/2008 [facility] Patient Information 2014 [place], [place].  _______________________________________________________________________

## 2022-05-17 NOTE — Progress Notes (Signed)
Subjective: Chief Complaint  Patient presents with   Callouses    Plantar warts      81 year old female presents For Further Evaluation.  She states she is doing well.  Calluses are not causing problems.  No open lesions.  Objective: General: No acute distress, AAOx3  DP/PT pulses palpable 2/4, CRT < 3 sec to all digits.   Sensation decreased with Semmes Weinstein monofilament Right foot: Annual hyperkeratotic lesion lateral plantar foot.  No underlying ulceration drainage or signs of infection. Left foot: Punctate annular hyperkeratotic lesions noted left foot metatarsal, plantar midfoot left side as well as the right fifth metatarsal base and submetatarsal area.  No ongoing ulceration drainage or signs of infection. Prominent metatarsal heads with digital deformity. No pain with calf compression, swelling, warmth, erythema.   Assessment and Plan:  Chronic skin lesions bilaterally, neuropathy  -Sharply debrided calluses x 3 without any complications or bleeding.  I held off on the [place] application today.  Will likely do this next appointment after her surgery that she is scheduled for (due to kidney cancer).  -Monitor for any clinical signs or symptoms of infection and directed to call the office immediately should any occur or go to the ER.  Return in about 3 weeks (around 05/11/2022).  Trula Slade DPM

## 2022-05-18 DIAGNOSIS — C642 Malignant neoplasm of left kidney, except renal pelvis: Secondary | ICD-10-CM | POA: Diagnosis not present

## 2022-05-18 DIAGNOSIS — K439 Ventral hernia without obstruction or gangrene: Secondary | ICD-10-CM | POA: Diagnosis not present

## 2022-05-20 ENCOUNTER — Encounter (HOSPITAL_COMMUNITY): Payer: Self-pay

## 2022-05-20 ENCOUNTER — Other Ambulatory Visit: Payer: Self-pay

## 2022-05-20 ENCOUNTER — Encounter (HOSPITAL_COMMUNITY)
Admission: RE | Admit: 2022-05-20 | Discharge: 2022-05-20 | Disposition: A | Discharge status: Home / Self Care | Payer: Medicare Other | Source: Ambulatory Visit | Attending: Urology | Admitting: Urology

## 2022-05-20 VITALS — BP 150/95 | HR 59 | Temp 98.5°F | Resp 16 | Ht 61.0 in | Wt 167.6 lb

## 2022-05-20 DIAGNOSIS — I2510 Atherosclerotic heart disease of native coronary artery without angina pectoris: Secondary | ICD-10-CM | POA: Insufficient documentation

## 2022-05-20 DIAGNOSIS — Z87891 Personal history of nicotine dependence: Secondary | ICD-10-CM | POA: Diagnosis not present

## 2022-05-20 DIAGNOSIS — K439 Ventral hernia without obstruction or gangrene: Secondary | ICD-10-CM | POA: Insufficient documentation

## 2022-05-20 DIAGNOSIS — Z01812 Encounter for preprocedural laboratory examination: Secondary | ICD-10-CM | POA: Insufficient documentation

## 2022-05-20 DIAGNOSIS — N183 Chronic kidney disease, stage 3 unspecified: Secondary | ICD-10-CM | POA: Diagnosis not present

## 2022-05-20 DIAGNOSIS — I129 Hypertensive chronic kidney disease with stage 1 through stage 4 chronic kidney disease, or unspecified chronic kidney disease: Secondary | ICD-10-CM | POA: Insufficient documentation

## 2022-05-20 DIAGNOSIS — N2889 Other specified disorders of kidney and ureter: Secondary | ICD-10-CM | POA: Diagnosis not present

## 2022-05-20 DIAGNOSIS — Z01818 Encounter for other preprocedural examination: Secondary | ICD-10-CM

## 2022-05-20 HISTORY — DX: Pneumonia, unspecified organism: J18.9

## 2022-05-20 HISTORY — DX: Gastro-esophageal reflux disease without esophagitis: K21.9

## 2022-05-20 HISTORY — DX: Atherosclerotic heart disease of native coronary artery without angina pectoris: I25.10

## 2022-05-20 LAB — BASIC METABOLIC PANEL
Anion gap: 7 (ref 5–15)
BUN: 29 mg/dL — ABNORMAL HIGH (ref 8–23)
CO2: 30 mmol/L (ref 22–32)
Calcium: 9.8 mg/dL (ref 8.9–10.3)
Chloride: 104 mmol/L (ref 98–111)
Creatinine, Ser: 1.2 mg/dL — ABNORMAL HIGH (ref 0.44–1.00)
GFR, Estimated: 45 mL/min — ABNORMAL LOW (ref >60)
Glucose, Bld: 97 mg/dL (ref 70–99)
Potassium: 4 mmol/L (ref 3.5–5.1)
Sodium: 141 mmol/L (ref 135–145)

## 2022-05-20 LAB — TYPE AND SCREEN
ABO/RH(D): O POS
Antibody Screen: NEGATIVE

## 2022-05-20 LAB — CBC
HCT: 45.2 % (ref 36.0–46.0)
Hemoglobin: 14.3 g/dL (ref 12.0–15.0)
MCH: 31.1 pg (ref 26.0–34.0)
MCHC: 31.6 g/dL (ref 30.0–36.0)
MCV: 98.3 fL (ref 80.0–100.0)
Platelets: 206 10*3/uL (ref 150–400)
RBC: 4.6 MIL/uL (ref 3.87–5.11)
RDW: 14.1 % (ref 11.5–15.5)
WBC: 4.5 10*3/uL (ref 4.0–10.5)
nRBC: 0 % (ref 0.0–0.2)

## 2022-05-21 NOTE — Progress Notes (Signed)
Anesthesia Chart Review   Case: [id number] Date/Time: 06/03/22 0815   Procedure: XI ROBOTIC ASSITED PARTIAL NEPHRECTOMY AND VENTRAL HERNIA (Left) - 3 HRS   Anesthesia type: General   Pre-op diagnosis: RENAL MASS, VENTRAL HERNIA   Location: [facility] ROOM 03 / [facility]   Surgeons: Alexis Frock, MD       DISCUSSION:81 y.o. former smoker with h/o HTN, CAD, CKD Stage III, renal mass, ventral hernia scheduled for above procedure 06/03/2022 with Dr. Alexis Frock.   Pt seen by PCP 04/27/2022.   Pt last seen by cardiology 04/20/2022. Per OV note, "Preoperative cardiac exam: According to the Revised Cardiac Risk Index (RCRI), her Perioperative Risk of Major Cardiac Event is (%): 0.4. Her Functional Capacity in METs is: 5.07 according to the Duke Activity Status Index (DASI). Therefore, based on ACC/AHA guidelines, patient would be at acceptable risk for the planned procedure without further cardiovascular testing. I will route this recommendation to the requesting party via Epic fax function."  Anticipate pt can proceed with planned procedure barring acute status change.    VS: BP (!) 150/95   Pulse (!) 59   Temp 36.9 C (Oral)   Resp 16   Ht '5\' 1"'$  (1.549 m)   Wt 76 kg   SpO2 100%   BMI 31.67 kg/m   PROVIDERS: Copland, Gay Filler, MD is PCP   Primary Cardiologist:  Kirk Ruths, MD  LABS: Labs reviewed: Acceptable for surgery. (all labs ordered are listed, but only abnormal results are displayed)  Labs Reviewed  BASIC METABOLIC PANEL - Abnormal; Notable for the following components:      Result Value   BUN 29 (*)    Creatinine, Ser 1.20 (*)    GFR, Estimated 45 (*)    All other components within normal limits  CBC  TYPE AND SCREEN     IMAGES:   EKG:   CV: Echo 04/16/2020 1. Left ventricular ejection fraction, by estimation, is 60 to 65%. The  left ventricle has normal function. The left ventricle has no regional  wall motion abnormalities. Left ventricular diastolic  parameters are  consistent with Grade I diastolic  dysfunction (impaired relaxation).   2. Right ventricular systolic function is normal. The right ventricular  size is normal.   3. Left atrial size was moderately dilated.   4. The mitral valve is normal in structure. Mild mitral valve  regurgitation.   5. The aortic valve is tricuspid. Aortic valve regurgitation is trivial.  Mild aortic valve sclerosis is present, with no evidence of aortic valve  stenosis.   6. Aortic dilatation noted. There is borderline dilatation of the  ascending aorta, measuring 38 mm.  Past Medical History:  Diagnosis Date   Anxiety    Arthritis    Cancer ([place])    lt. foot melanoma   Chronic kidney disease    lt. kidney cyst   Coronary artery disease    GERD (gastroesophageal reflux disease)    History of healed stress fracture 2013   bilateral feet   Hx of phlebitis    reports remote hx of "superfical blood clots" never anticoagulated   Hypertension    Osteopenia    Pneumonia     Past Surgical History:  Procedure Laterality Date   CATARACT EXTRACTION W/ INTRAOCULAR LENS IMPLANT Bilateral    CHOLECYSTECTOMY N/A 09/19/2016   Procedure: LAPAROSCOPIC CHOLECYSTECTOMY POSSIBLE INTRAOPERATIVE CHOLANGIOGRAM;  Surgeon: Rolm Bookbinder, MD;  Location: [place];  Service: General;  Laterality: N/A;   ERCP N/A 09/18/2016  Procedure: ENDOSCOPIC RETROGRADE CHOLANGIOPANCREATOGRAPHY (ERCP);  Surgeon: Doran Stabler, MD;  Location: [facility];  Service: Endoscopy;  Laterality: N/A;   FOOT SURGERY     patient reports four foot surgeries   FOOT SURGERY Left    bunion, hammer toe surgery   FOOT SURGERY Right    shaved bunion, hammer toe correction   HERNIA REPAIR     KNEE ARTHROSCOPY Right 06/02/1995   REPLACEMENT TOTAL KNEE Left    REPLACEMENT TOTAL KNEE Left 10/30/2012   RETINAL DETACHMENT SURGERY     RETINAL DETACHMENT SURGERY Left 06/01/2006   THYROIDECTOMY, PARTIAL Right    THYROIDECTOMY, PARTIAL   06/02/2003   TOTAL KNEE ARTHROPLASTY Right 11/13/2019   Procedure: TOTAL KNEE ARTHROPLASTY SDDC;  Surgeon: Gaynelle Arabian, MD;  Location[facility];  Service: Orthopedics;  Laterality: Right;  58mn    MEDICATIONS:  acetaminophen (TYLENOL) 500 MG tablet   amoxicillin-clavulanate (AUGMENTIN) 500-125 MG tablet   Calcium Carb-Cholecalciferol (CALCIUM 600 + D PO)   CRANBERRY PO   diclofenac Sodium (VOLTAREN) 1 % GEL   hydrochlorothiazide (HYDRODIURIL) 12.5 MG tablet   LORazepam (ATIVAN) 1 MG tablet   metoprolol succinate (TOPROL-XL) 25 MG 24 hr tablet   Multiple Vitamin (MULTIVITAMIN WITH MINERALS) TABS tablet   NONFORMULARY OR COMPOUNDED ITEM   omeprazole (PRILOSEC) 20 MG capsule   pravastatin (PRAVACHOL) 20 MG tablet   pregabalin (LYRICA) 150 MG capsule   No current facility-administered medications for this encounter.    JKonrad FelixWard, PA-C [facility] ([phone]

## 2022-05-21 NOTE — Anesthesia Preprocedure Evaluation (Addendum)
Anesthesia Evaluation  Patient identified by MRN, date of birth, ID band Patient awake    Reviewed: Allergy & Precautions, NPO status , Patient's Chart, lab work & pertinent test results  Airway Mallampati: I       Dental no notable dental hx.    Pulmonary former smoker   Pulmonary exam normal        Cardiovascular hypertension, Pt. on medications Normal cardiovascular exam     Neuro/Psych  PSYCHIATRIC DISORDERS Anxiety Depression       GI/Hepatic Neg liver ROS,GERD  Medicated,,  Endo/Other  negative endocrine ROS    Renal/GU Renal InsufficiencyRenal disease     Musculoskeletal  (+) Arthritis , Osteoarthritis,    Abdominal  (+) + obese  Peds  Hematology negative hematology ROS (+)   Anesthesia Other Findings     1. Left ventricular ejection fraction, by estimation, is 60 to 65%. The  left ventricle has normal function. The left ventricle has no regional  wall motion abnormalities. Left ventricular diastolic parameters are  consistent with Grade I diastolic  dysfunction (impaired relaxation).   2. Right ventricular systolic function is normal. The right ventricular  size is normal.   3. Left atrial size was moderately dilated.   4. The mitral valve is normal in structure. Mild mitral valve  regurgitation.   5. The aortic valve is tricuspid. Aortic valve regurgitation is trivial.  Mild aortic valve sclerosis is present, with no evidence of aortic valve  stenosis.   6. Aortic dilatation noted. There is borderline dilatation of the  ascending aorta, measuring 38 mm.   FINDINGS   Left Ventricle: Left ventricular ejection fraction, by estimation, is 60  to 65%. The left ventricle has normal function. The left ventricle has no  regional wall motion abnormalities. The left ventricular internal cavity  size was nor    Reproductive/Obstetrics                             Anesthesia  Physical Anesthesia Plan  ASA: 2  Anesthesia Plan: General   Post-op Pain Management: Ofirmev IV (intra-op)*   Induction: Intravenous  PONV Risk Score and Plan: 4 or greater and Ondansetron, Treatment may vary due to age or medical condition and Dexamethasone  Airway Management Planned: Oral ETT  Additional Equipment: None  Intra-op Plan:   Post-operative Plan: Extubation in OR  Informed Consent: I have reviewed the patients History and Physical, chart, labs and discussed the procedure including the risks, benefits and alternatives for the proposed anesthesia with the patient or authorized representative who has indicated his/her understanding and acceptance.     Dental advisory given  Plan Discussed with: CRNA  Anesthesia Plan Comments: (See PAT note 05/20/2022)       Anesthesia Quick Evaluation

## 2022-06-02 ENCOUNTER — Ambulatory Visit: Payer: Medicare Other | Admitting: Podiatry

## 2022-06-03 ENCOUNTER — Inpatient Hospital Stay (HOSPITAL_COMMUNITY): Payer: Medicare Other | Admitting: Certified Registered Nurse Anesthetist

## 2022-06-03 ENCOUNTER — Encounter (HOSPITAL_COMMUNITY)
Admission: RE | Disposition: A | Discharge status: Home / Self Care | Payer: Self-pay | Source: Ambulatory Visit | Attending: Urology

## 2022-06-03 ENCOUNTER — Observation Stay (HOSPITAL_COMMUNITY)
Admission: RE | Admit: 2022-06-03 | Discharge: 2022-06-04 | Disposition: A | Discharge status: Home / Self Care | Payer: Medicare Other | Source: Ambulatory Visit | Attending: Urology | Admitting: Urology

## 2022-06-03 ENCOUNTER — Other Ambulatory Visit: Payer: Self-pay

## 2022-06-03 ENCOUNTER — Inpatient Hospital Stay (HOSPITAL_COMMUNITY): Payer: Medicare Other | Admitting: Physician Assistant

## 2022-06-03 ENCOUNTER — Other Ambulatory Visit (HOSPITAL_COMMUNITY): Payer: Self-pay

## 2022-06-03 ENCOUNTER — Encounter (HOSPITAL_COMMUNITY): Payer: Self-pay | Admitting: Urology

## 2022-06-03 DIAGNOSIS — I2510 Atherosclerotic heart disease of native coronary artery without angina pectoris: Secondary | ICD-10-CM | POA: Diagnosis not present

## 2022-06-03 DIAGNOSIS — N189 Chronic kidney disease, unspecified: Secondary | ICD-10-CM | POA: Diagnosis not present

## 2022-06-03 DIAGNOSIS — Z79899 Other long term (current) drug therapy: Secondary | ICD-10-CM | POA: Insufficient documentation

## 2022-06-03 DIAGNOSIS — Z87891 Personal history of nicotine dependence: Secondary | ICD-10-CM | POA: Diagnosis not present

## 2022-06-03 DIAGNOSIS — C642 Malignant neoplasm of left kidney, except renal pelvis: Secondary | ICD-10-CM | POA: Diagnosis not present

## 2022-06-03 DIAGNOSIS — N819 Female genital prolapse, unspecified: Secondary | ICD-10-CM | POA: Diagnosis not present

## 2022-06-03 DIAGNOSIS — Z85828 Personal history of other malignant neoplasm of skin: Secondary | ICD-10-CM | POA: Diagnosis not present

## 2022-06-03 DIAGNOSIS — K439 Ventral hernia without obstruction or gangrene: Secondary | ICD-10-CM

## 2022-06-03 DIAGNOSIS — Z96651 Presence of right artificial knee joint: Secondary | ICD-10-CM | POA: Diagnosis not present

## 2022-06-03 DIAGNOSIS — N2889 Other specified disorders of kidney and ureter: Secondary | ICD-10-CM | POA: Diagnosis not present

## 2022-06-03 DIAGNOSIS — I129 Hypertensive chronic kidney disease with stage 1 through stage 4 chronic kidney disease, or unspecified chronic kidney disease: Secondary | ICD-10-CM | POA: Diagnosis not present

## 2022-06-03 HISTORY — PX: ROBOTIC ASSITED PARTIAL NEPHRECTOMY: SHX6087

## 2022-06-03 LAB — HEMOGLOBIN AND HEMATOCRIT, BLOOD
HCT: 43.2 % (ref 36.0–46.0)
Hemoglobin: 13.8 g/dL (ref 12.0–15.0)

## 2022-06-03 SURGERY — NEPHRECTOMY, PARTIAL, ROBOT-ASSISTED
Anesthesia: General | Laterality: Left

## 2022-06-03 MED ORDER — ACETAMINOPHEN 500 MG PO TABS
1000.0000 mg | ORAL_TABLET | Freq: Four times a day (QID) | ORAL | Status: AC
  Administered 2022-06-03 – 2022-06-04 (×4): 1000 mg via ORAL
  Filled 2022-06-03 (×4): qty 2

## 2022-06-03 MED ORDER — FENTANYL CITRATE (PF) 250 MCG/5ML IJ SOLN
INTRAMUSCULAR | Status: AC
  Filled 2022-06-03: qty 5

## 2022-06-03 MED ORDER — DIPHENHYDRAMINE HCL 50 MG/ML IJ SOLN: 12.5000 mg | Freq: Four times a day (QID) | INTRAMUSCULAR | Status: DC | PRN

## 2022-06-03 MED ORDER — ONDANSETRON HCL 4 MG/2ML IJ SOLN
INTRAMUSCULAR | Status: DC | PRN
  Administered 2022-06-03: 4 mg via INTRAVENOUS

## 2022-06-03 MED ORDER — FENTANYL CITRATE PF 50 MCG/ML IJ SOSY
PREFILLED_SYRINGE | INTRAMUSCULAR | Status: AC
  Filled 2022-06-03: qty 1

## 2022-06-03 MED ORDER — STERILE WATER FOR IRRIGATION IR SOLN
Status: DC | PRN
  Administered 2022-06-03: 1000 mL

## 2022-06-03 MED ORDER — DEXAMETHASONE SODIUM PHOSPHATE 10 MG/ML IJ SOLN
INTRAMUSCULAR | Status: AC
  Filled 2022-06-03: qty 1

## 2022-06-03 MED ORDER — MAGNESIUM CITRATE PO SOLN: 1.0000 | Freq: Once | ORAL | Status: DC

## 2022-06-03 MED ORDER — DIPHENHYDRAMINE HCL 12.5 MG/5ML PO ELIX: 12.5000 mg | ORAL_SOLUTION | Freq: Four times a day (QID) | ORAL | Status: DC | PRN

## 2022-06-03 MED ORDER — PROPOFOL 10 MG/ML IV BOLUS
INTRAVENOUS | Status: AC
  Filled 2022-06-03: qty 20

## 2022-06-03 MED ORDER — BUPIVACAINE LIPOSOME 1.3 % IJ SUSP
INTRAMUSCULAR | Status: DC | PRN
  Administered 2022-06-03: 20 mL

## 2022-06-03 MED ORDER — DOCUSATE SODIUM 100 MG PO CAPS
100.0000 mg | ORAL_CAPSULE | Freq: Two times a day (BID) | ORAL | Status: DC
  Administered 2022-06-03 – 2022-06-04 (×2): 100 mg via ORAL
  Filled 2022-06-03 (×2): qty 1

## 2022-06-03 MED ORDER — TRAMADOL HCL 50 MG PO TABS
ORAL_TABLET | ORAL | Status: AC
  Filled 2022-06-03: qty 2

## 2022-06-03 MED ORDER — ONDANSETRON HCL 4 MG/2ML IJ SOLN: 4.0000 mg | Freq: Once | INTRAMUSCULAR | Status: DC | PRN

## 2022-06-03 MED ORDER — ONDANSETRON HCL 4 MG/2ML IJ SOLN
INTRAMUSCULAR | Status: AC
  Filled 2022-06-03: qty 2

## 2022-06-03 MED ORDER — SODIUM CHLORIDE (PF) 0.9 % IJ SOLN
INTRAMUSCULAR | Status: AC
  Filled 2022-06-03: qty 10

## 2022-06-03 MED ORDER — ORAL CARE MOUTH RINSE: 15.0000 mL | Freq: Once | OROMUCOSAL | Status: AC

## 2022-06-03 MED ORDER — HYDROMORPHONE HCL 1 MG/ML IJ SOLN
0.5000 mg | INTRAMUSCULAR | Status: DC | PRN
  Administered 2022-06-03 (×2): 0.5 mg via INTRAVENOUS
  Filled 2022-06-03 (×2): qty 1

## 2022-06-03 MED ORDER — HEMOSTATIC AGENTS (NO CHARGE) OPTIME
TOPICAL | Status: DC | PRN
  Administered 2022-06-03: 1 via TOPICAL

## 2022-06-03 MED ORDER — SODIUM CHLORIDE 0.45 % IV SOLN: INTRAVENOUS | Status: DC

## 2022-06-03 MED ORDER — PHENYLEPHRINE HCL (PRESSORS) 10 MG/ML IV SOLN
INTRAVENOUS | Status: AC
  Filled 2022-06-03: qty 1

## 2022-06-03 MED ORDER — CHLORHEXIDINE GLUCONATE 0.12 % MT SOLN
15.0000 mL | Freq: Once | OROMUCOSAL | Status: AC
  Administered 2022-06-03: 15 mL via OROMUCOSAL

## 2022-06-03 MED ORDER — LIDOCAINE HCL (CARDIAC) PF 100 MG/5ML IV SOSY
PREFILLED_SYRINGE | INTRAVENOUS | Status: DC | PRN
  Administered 2022-06-03: 80 mg via INTRAVENOUS

## 2022-06-03 MED ORDER — DOCUSATE SODIUM 100 MG PO CAPS: 100.0000 mg | ORAL_CAPSULE | Freq: Two times a day (BID) | ORAL | Status: DC

## 2022-06-03 MED ORDER — ROCURONIUM BROMIDE 10 MG/ML (PF) SYRINGE
PREFILLED_SYRINGE | INTRAVENOUS | Status: DC | PRN
  Administered 2022-06-03: 50 mg via INTRAVENOUS
  Administered 2022-06-03: 30 mg via INTRAVENOUS

## 2022-06-03 MED ORDER — SODIUM CHLORIDE (PF) 0.9 % IJ SOLN
INTRAMUSCULAR | Status: AC
  Filled 2022-06-03: qty 20

## 2022-06-03 MED ORDER — DEXAMETHASONE SODIUM PHOSPHATE 10 MG/ML IJ SOLN
INTRAMUSCULAR | Status: DC | PRN
  Administered 2022-06-03: 5 mg via INTRAVENOUS

## 2022-06-03 MED ORDER — ROCURONIUM BROMIDE 10 MG/ML (PF) SYRINGE
PREFILLED_SYRINGE | INTRAVENOUS | Status: AC
  Filled 2022-06-03: qty 10

## 2022-06-03 MED ORDER — ACETAMINOPHEN 10 MG/ML IV SOLN
1000.0000 mg | Freq: Once | INTRAVENOUS | Status: DC | PRN
  Administered 2022-06-03: 1000 mg via INTRAVENOUS

## 2022-06-03 MED ORDER — CEFAZOLIN SODIUM-DEXTROSE 2-4 GM/100ML-% IV SOLN
2.0000 g | INTRAVENOUS | Status: AC
  Administered 2022-06-03: 2 g via INTRAVENOUS
  Filled 2022-06-03: qty 100

## 2022-06-03 MED ORDER — LACTATED RINGERS IV SOLN: INTRAVENOUS | Status: DC

## 2022-06-03 MED ORDER — LACTATED RINGERS IR SOLN
Status: DC | PRN
  Administered 2022-06-03: 1000 mL

## 2022-06-03 MED ORDER — LORAZEPAM 1 MG PO TABS
1.0000 mg | ORAL_TABLET | Freq: Two times a day (BID) | ORAL | Status: DC | PRN
  Administered 2022-06-03: 1 mg via ORAL
  Filled 2022-06-03: qty 1

## 2022-06-03 MED ORDER — TRAMADOL HCL 50 MG PO TABS
50.0000 mg | ORAL_TABLET | Freq: Four times a day (QID) | ORAL | 0 refills | Status: DC | PRN
  Filled 2022-06-03: qty 20, 3d supply, fill #0

## 2022-06-03 MED ORDER — LIDOCAINE HCL (PF) 2 % IJ SOLN
INTRAMUSCULAR | Status: AC
  Filled 2022-06-03: qty 5

## 2022-06-03 MED ORDER — ONDANSETRON HCL 4 MG/2ML IJ SOLN: 4.0000 mg | INTRAMUSCULAR | Status: DC | PRN

## 2022-06-03 MED ORDER — SODIUM CHLORIDE 0.9% FLUSH
INTRAVENOUS | Status: DC | PRN
  Administered 2022-06-03: 10 mL

## 2022-06-03 MED ORDER — HYOSCYAMINE SULFATE 0.125 MG SL SUBL: 0.1250 mg | SUBLINGUAL_TABLET | SUBLINGUAL | Status: DC | PRN

## 2022-06-03 MED ORDER — BUPIVACAINE LIPOSOME 1.3 % IJ SUSP
INTRAMUSCULAR | Status: AC
  Filled 2022-06-03: qty 20

## 2022-06-03 MED ORDER — PREGABALIN 75 MG PO CAPS
150.0000 mg | ORAL_CAPSULE | Freq: Two times a day (BID) | ORAL | Status: DC
  Administered 2022-06-03 – 2022-06-04 (×2): 150 mg via ORAL
  Filled 2022-06-03 (×2): qty 2

## 2022-06-03 MED ORDER — PANTOPRAZOLE SODIUM 40 MG PO TBEC
40.0000 mg | DELAYED_RELEASE_TABLET | Freq: Every day | ORAL | Status: DC
  Administered 2022-06-04: 40 mg via ORAL
  Filled 2022-06-03: qty 1

## 2022-06-03 MED ORDER — HYDROMORPHONE HCL 1 MG/ML IJ SOLN
INTRAMUSCULAR | Status: AC
  Filled 2022-06-03: qty 1

## 2022-06-03 MED ORDER — FENTANYL CITRATE (PF) 250 MCG/5ML IJ SOLN
INTRAMUSCULAR | Status: DC | PRN
  Administered 2022-06-03 (×5): 50 ug via INTRAVENOUS

## 2022-06-03 MED ORDER — PHENYLEPHRINE 80 MCG/ML (10ML) SYRINGE FOR IV PUSH (FOR BLOOD PRESSURE SUPPORT)
PREFILLED_SYRINGE | INTRAVENOUS | Status: DC | PRN
  Administered 2022-06-03: 80 ug via INTRAVENOUS
  Administered 2022-06-03 (×2): 40 ug via INTRAVENOUS

## 2022-06-03 MED ORDER — FENTANYL CITRATE PF 50 MCG/ML IJ SOSY
25.0000 ug | PREFILLED_SYRINGE | INTRAMUSCULAR | Status: DC | PRN
  Administered 2022-06-03 (×3): 50 ug via INTRAVENOUS

## 2022-06-03 MED ORDER — PHENYLEPHRINE 80 MCG/ML (10ML) SYRINGE FOR IV PUSH (FOR BLOOD PRESSURE SUPPORT)
PREFILLED_SYRINGE | INTRAVENOUS | Status: AC
  Filled 2022-06-03: qty 10

## 2022-06-03 MED ORDER — SUGAMMADEX SODIUM 200 MG/2ML IV SOLN
INTRAVENOUS | Status: DC | PRN
  Administered 2022-06-03: 200 mg via INTRAVENOUS

## 2022-06-03 MED ORDER — SIMETHICONE 40 MG/0.6ML PO SUSP: 40.0000 mg | Freq: Four times a day (QID) | ORAL | Status: DC | PRN

## 2022-06-03 MED ORDER — PROPOFOL 10 MG/ML IV BOLUS
INTRAVENOUS | Status: DC | PRN
  Administered 2022-06-03: 150 mg via INTRAVENOUS

## 2022-06-03 MED ORDER — ACETAMINOPHEN 10 MG/ML IV SOLN
INTRAVENOUS | Status: AC
  Filled 2022-06-03: qty 100

## 2022-06-03 MED ORDER — HYDROMORPHONE HCL 1 MG/ML IJ SOLN
0.2500 mg | INTRAMUSCULAR | Status: DC | PRN
  Administered 2022-06-03 (×2): 0.5 mg via INTRAVENOUS

## 2022-06-03 MED ORDER — FENTANYL CITRATE PF 50 MCG/ML IJ SOSY
PREFILLED_SYRINGE | INTRAMUSCULAR | Status: AC
  Filled 2022-06-03: qty 2

## 2022-06-03 MED ORDER — TRAMADOL HCL 50 MG PO TABS
50.0000 mg | ORAL_TABLET | Freq: Four times a day (QID) | ORAL | Status: DC | PRN
  Administered 2022-06-03: 100 mg via ORAL
  Administered 2022-06-04: 50 mg via ORAL
  Filled 2022-06-03: qty 1

## 2022-06-03 MED ORDER — METOPROLOL SUCCINATE ER 25 MG PO TB24
25.0000 mg | ORAL_TABLET | Freq: Every day | ORAL | Status: DC
  Administered 2022-06-04: 25 mg via ORAL
  Filled 2022-06-03: qty 1

## 2022-06-03 MED ORDER — PRAVASTATIN SODIUM 20 MG PO TABS
20.0000 mg | ORAL_TABLET | Freq: Every evening | ORAL | Status: DC
  Administered 2022-06-03 – 2022-06-04 (×2): 20 mg via ORAL
  Filled 2022-06-03 (×2): qty 1

## 2022-06-03 MED ORDER — PHENYLEPHRINE HCL-NACL 20-0.9 MG/250ML-% IV SOLN
INTRAVENOUS | Status: DC | PRN
  Administered 2022-06-03: 25 ug/min via INTRAVENOUS

## 2022-06-03 SURGICAL SUPPLY — 75 items
APPLICATOR SURGIFLO ENDO (HEMOSTASIS) ×1 IMPLANT
BAG COUNTER SPONGE SURGICOUNT (BAG) IMPLANT
CHLORAPREP W/TINT 26 (MISCELLANEOUS) ×1 IMPLANT
CLIP LIGATING HEM O LOK PURPLE (MISCELLANEOUS) ×2 IMPLANT
CLIP LIGATING HEMO LOK XL GOLD (MISCELLANEOUS) IMPLANT
CLIP LIGATING HEMO O LOK GREEN (MISCELLANEOUS) ×1 IMPLANT
CLIP SUT LAPRA TY ABSORB (SUTURE) ×1 IMPLANT
COVER SURGICAL LIGHT HANDLE (MISCELLANEOUS) ×1 IMPLANT
COVER TIP SHEARS 8 DVNC (MISCELLANEOUS) ×1 IMPLANT
COVER TIP SHEARS 8MM DA VINCI (MISCELLANEOUS) ×1
CUTTER ECHEON FLEX ENDO 45 340 (ENDOMECHANICALS) IMPLANT
DERMABOND ADVANCED .7 DNX12 (GAUZE/BANDAGES/DRESSINGS) ×1 IMPLANT
DRAIN CHANNEL 15F RND FF 3/16 (WOUND CARE) ×1 IMPLANT
DRAPE ARM DVNC X/XI (DISPOSABLE) ×4 IMPLANT
DRAPE COLUMN DVNC XI (DISPOSABLE) ×1 IMPLANT
DRAPE DA VINCI XI ARM (DISPOSABLE) ×4
DRAPE DA VINCI XI COLUMN (DISPOSABLE) ×1
DRAPE INCISE IOBAN 66X45 STRL (DRAPES) ×1 IMPLANT
DRAPE SHEET LG 3/4 BI-LAMINATE (DRAPES) ×1 IMPLANT
DRSG TEGADERM 4X4.75 (GAUZE/BANDAGES/DRESSINGS) ×1 IMPLANT
ELECT PENCIL ROCKER SW 15FT (MISCELLANEOUS) ×1 IMPLANT
ELECT REM PT RETURN 15FT ADLT (MISCELLANEOUS) ×1 IMPLANT
EVACUATOR SILICONE 100CC (DRAIN) ×1 IMPLANT
GAUZE 4X4 16PLY ~~LOC~~+RFID DBL (SPONGE) ×1 IMPLANT
GAUZE SPONGE 2X2 8PLY STRL LF (GAUZE/BANDAGES/DRESSINGS) ×1 IMPLANT
GLOVE BIO SURGEON STRL SZ 6.5 (GLOVE) ×1 IMPLANT
GLOVE SURG LX STRL 7.5 STRW (GLOVE) ×2 IMPLANT
GOWN SRG XL LVL 4 BRTHBL STRL (GOWNS) ×1 IMPLANT
GOWN STRL NON-REIN XL LVL4 (GOWNS) ×2
GOWN STRL REUS W/ TWL XL LVL3 (GOWN DISPOSABLE) ×2 IMPLANT
GOWN STRL REUS W/TWL XL LVL3 (GOWN DISPOSABLE) ×2
HEMOSTAT SURGICEL 4X8 (HEMOSTASIS) ×1 IMPLANT
HOLDER FOLEY CATH W/STRAP (MISCELLANEOUS) ×1 IMPLANT
IRRIG SUCT STRYKERFLOW 2 WTIP (MISCELLANEOUS) ×1
IRRIGATION SUCT STRKRFLW 2 WTP (MISCELLANEOUS) ×1 IMPLANT
KIT BASIN OR (CUSTOM PROCEDURE TRAY) ×1 IMPLANT
KIT TURNOVER KIT A (KITS) IMPLANT
LOOP VESSEL MAXI BLUE (MISCELLANEOUS) ×1 IMPLANT
MARKER SKIN DUAL TIP RULER LAB (MISCELLANEOUS) ×1 IMPLANT
NDL INSUFFLATION 14GA 120MM (NEEDLE) ×1 IMPLANT
NEEDLE HYPO 22GX1.5 SAFETY (NEEDLE) ×1 IMPLANT
NEEDLE INSUFFLATION 14GA 120MM (NEEDLE) ×1 IMPLANT
PORT ACCESS TROCAR AIRSEAL 12 (TROCAR) ×1 IMPLANT
PROTECTOR NERVE ULNAR (MISCELLANEOUS) ×2 IMPLANT
RELOAD STAPLE 45 2.6 WHT THIN (STAPLE) IMPLANT
SCISSORS LAP 5X45 EPIX DISP (ENDOMECHANICALS) IMPLANT
SEAL CANN UNIV 5-8 DVNC XI (MISCELLANEOUS) ×4 IMPLANT
SEAL XI 5MM-8MM UNIVERSAL (MISCELLANEOUS) ×4
SET TRI-LUMEN FLTR TB AIRSEAL (TUBING) ×1 IMPLANT
SOLUTION ELECTROLUBE (MISCELLANEOUS) ×1 IMPLANT
SPIKE FLUID TRANSFER (MISCELLANEOUS) ×1 IMPLANT
SPONGE T-LAP 4X18 ~~LOC~~+RFID (SPONGE) ×1 IMPLANT
STAPLE RELOAD 45 WHT (STAPLE) ×1 IMPLANT
STAPLE RELOAD 45MM WHITE (STAPLE) ×1
SURGIFLO W/THROMBIN 8M KIT (HEMOSTASIS) ×1 IMPLANT
SUT ETHILON 3 0 PS 1 (SUTURE) ×1 IMPLANT
SUT MNCRL AB 4-0 PS2 18 (SUTURE) ×2 IMPLANT
SUT NOVA NAB DX-16 0-1 5-0 T12 (SUTURE) IMPLANT
SUT PDS AB 1 CT1 27 (SUTURE) ×2 IMPLANT
SUT V-LOC BARB 180 2/0GR6 GS22 (SUTURE)
SUT VIC AB 0 CT1 27 (SUTURE) ×4
SUT VIC AB 0 CT1 27XBRD ANTBC (SUTURE) ×4 IMPLANT
SUT VIC AB 2-0 SH 27 (SUTURE) ×2
SUT VIC AB 2-0 SH 27X BRD (SUTURE) ×2 IMPLANT
SUT VLOC BARB 180 ABS3/0GR12 (SUTURE) ×1
SUTURE V-LC BRB 180 2/0GR6GS22 (SUTURE) IMPLANT
SUTURE VLOC BRB 180 ABS3/0GR12 (SUTURE) ×1 IMPLANT
SYS BAG RETRIEVAL 10MM (BASKET) ×1
SYSTEM BAG RETRIEVAL 10MM (BASKET) ×1 IMPLANT
TOWEL OR 17X26 10 PK STRL BLUE (TOWEL DISPOSABLE) ×1 IMPLANT
TRAY FOLEY MTR SLVR 16FR STAT (SET/KITS/TRAYS/PACK) ×1 IMPLANT
TRAY LAPAROSCOPIC (CUSTOM PROCEDURE TRAY) ×1 IMPLANT
TROCAR Z THREAD OPTICAL 12X100 (TROCAR) ×1 IMPLANT
TROCAR Z-THREAD OPTICAL 5X100M (TROCAR) IMPLANT
WATER STERILE IRR 1000ML POUR (IV SOLUTION) ×2 IMPLANT

## 2022-06-03 NOTE — H&P (Signed)
Margaret [name] is an 82 y.o. female.    Chief Complaint: Pre-Op LEFT Partial Nephrectomy / Hernia Repair  HPI:   1 - Left Renal Neoplasm - incidetnal 2cm left renal lesion by CT 2020 in [place]. Dedicated contrast CT 07/2018 with 2cm mildly enhancing (20HU) mass c/w possible low grade renal cancer. Mass is left upper lateral, 80% exophytic. 3 artery (1 superior vein edge, 2 inferior vein edge) / 1 vein (with lumbar inferior to arteries) lumbar inferior to lower arteries renovascular anatomy.   Recent Surveillance:  01/2020 - CT - no change of small left renal mass, Cr <1.  03/2021 - CT - increase to 2.6cm, Cr 0.8  03/2022 - CT - increase to 3.3cm, Cr 1.1   2 - Pelvic Organ Prolapse - manages with pessary per GYN (Dr. Cecille Po) for symptomatic prolapse.   3 - Ventral Hernia - s/p prior non-mesh repair. Some recurrence of fat-contianing supra-imbilical hernia by CT 2122. No bowel in aposition to anterior abd wall.   PMH sig for neuropathy / gabapentin, chole, open non-mesh ventral hernia repair. No ischemic CV disease / blood thinners. Her duaughter "[name]" is very infolved and lives upstairs from her, they are neighbors of [place]. Originally from [place] area. HEr son Yvone Neu from [place] also very involved. Her PCP is Lamar Blinks MD.   Today "Margaret [name]" is seen to proceed with LEFT partial nephrectomy and ventral hernia repair.   Past Medical History:  Diagnosis Date   Anxiety    Arthritis    Cancer ([place])    lt. foot melanoma   Chronic kidney disease    lt. kidney cyst   Coronary artery disease    GERD (gastroesophageal reflux disease)    History of healed stress fracture 2013   bilateral feet   Hx of phlebitis    reports remote hx of "superfical blood clots" never anticoagulated   Hypertension    Osteopenia    Pneumonia     Past Surgical History:  Procedure Laterality Date   CATARACT EXTRACTION W/ INTRAOCULAR LENS IMPLANT Bilateral    CHOLECYSTECTOMY N/A  09/19/2016   Procedure: LAPAROSCOPIC CHOLECYSTECTOMY POSSIBLE INTRAOPERATIVE CHOLANGIOGRAM;  Surgeon: Rolm Bookbinder, MD;  Location: [place];  Service: General;  Laterality: N/A;   ERCP N/A 09/18/2016   Procedure: ENDOSCOPIC RETROGRADE CHOLANGIOPANCREATOGRAPHY (ERCP);  Surgeon: Doran Stabler, MD;  Location: [facility] ENDOSCOPY;  Service: Endoscopy;  Laterality: N/A;   FOOT SURGERY     patient reports four foot surgeries   FOOT SURGERY Left    bunion, hammer toe surgery   FOOT SURGERY Right    shaved bunion, hammer toe correction   HERNIA REPAIR     KNEE ARTHROSCOPY Right 06/02/1995   REPLACEMENT TOTAL KNEE Left    REPLACEMENT TOTAL KNEE Left 10/30/2012   RETINAL DETACHMENT SURGERY     RETINAL DETACHMENT SURGERY Left 06/01/2006   THYROIDECTOMY, PARTIAL Right    THYROIDECTOMY, PARTIAL  06/02/2003   TOTAL KNEE ARTHROPLASTY Right 11/13/2019   Procedure: TOTAL KNEE ARTHROPLASTY SDDC;  Surgeon: Gaynelle Arabian, MD;  [facility];  Service: Orthopedics;  Laterality: Right;  55mn    Family History  Problem Relation Age of Onset   Heart disease Mother    Heart attack Mother    Heart disease Father    Diabetes Maternal Grandmother    Cancer Paternal Grandfather        stomach   Social History:  reports that she quit smoking about 43 years ago. Her smoking use included  cigarettes. She has a 3.75 pack-year smoking history. She has never used smokeless tobacco. She reports current alcohol use of about 1.0 standard drink of alcohol per week. She reports that she does not use drugs.  Allergies:  Allergies  Allergen Reactions   Keflex [Cephalexin] Diarrhea   Percocet [Oxycodone-Acetaminophen] Other (See Comments)    Syncope (passed out)   Clindamycin/Lincomycin Rash    Medications Prior to Admission  Medication Sig Dispense Refill   acetaminophen (TYLENOL) 500 MG tablet Take 1,000 mg by mouth every 6 (six) hours as needed (pain.).      Calcium Carb-Cholecalciferol (CALCIUM 600 + D PO)  Take 1 tablet by mouth daily.     CRANBERRY PO Take 1 tablet by mouth daily.     diclofenac Sodium (VOLTAREN) 1 % GEL Apply 2 g topically 4 (four) times daily as needed (pain).     hydrochlorothiazide (HYDRODIURIL) 12.5 MG tablet Take 0.5 tablets (6.25 mg total) by mouth daily as needed (elevated bp). (Patient taking differently: Take 6.25 mg by mouth daily.) 45 tablet 3   LORazepam (ATIVAN) 1 MG tablet Take 1 tablet (1 mg total) by mouth 2 (two) times daily as needed for anxiety. Do not combine with alcohol or any other sedating substances 60 tablet 0   metoprolol succinate (TOPROL-XL) 25 MG 24 hr tablet Take 1 tablet (25 mg total) by mouth daily. 90 tablet 0   Multiple Vitamin (MULTIVITAMIN WITH MINERALS) TABS tablet Take 1 tablet by mouth daily.     NONFORMULARY OR COMPOUNDED ITEM [facility]:  Peripheral Neuropathy - Bupivacaine 1%, Doxepin 3%, Gabapentin 6%, Pentoxifylline 3%, Topiramate 1%. Apply 1-2 grams to affected area 3-4 times daily. (Patient taking differently: Apply 1 application  topically 4 (four) times daily as needed (neuropathy/pain (feet)). [facility]:  Peripheral Neuropathy - Bupivacaine 1%, Doxepin 3%, Gabapentin 6%, Pentoxifylline 3%, Topiramate 1%. Apply 1-2 grams to affected area 3-4 times daily.) 100 each 11   omeprazole (PRILOSEC) 20 MG capsule Take 1 capsule (20 mg total) by mouth daily. 90 capsule 1   pravastatin (PRAVACHOL) 20 MG tablet TAKE ONE TABLET BY MOUTH DAILY 90 tablet 0   pregabalin (LYRICA) 150 MG capsule Take 1 capsule (150 mg total) by mouth 2 (two) times daily. 60 capsule 2   amoxicillin-clavulanate (AUGMENTIN) 500-125 MG tablet Take 1 tablet by mouth 2 (two) times daily. (Patient not taking: Reported on 05/18/2022) 10 tablet 0    No results found for this or any previous visit (from the past 48 hour(s)). No results found.  Review of Systems  All other systems reviewed and are negative.   Height '5\' 1"'$  (1.549 m), weight 76 kg. Physical  Exam Vitals reviewed.  Constitutional:      Comments: Some stable mild cognitive decline. AOx3, daughter and son at bedside.   HENT:     Head: Normocephalic.  Eyes:     Pupils: Pupils are equal, round, and reactive to light.  Cardiovascular:     Rate and Rhythm: Normal rate.  Pulmonary:     Effort: Pulmonary effort is normal.  Abdominal:     Comments: Stable reducible hernia.   Musculoskeletal:        General: Normal range of motion.  Neurological:     Mental Status: She is alert.  Psychiatric:        Mood and Affect: Mood normal.      Assessment/Plan  Proceed as planned with LEFT partial nephrectomy and ventral hernia repair. Risks, benefits, alternative, expected peri-op course  discussed previously and reiterated today.   Alexis Frock, MD 06/03/2022, 7:22 AM

## 2022-06-03 NOTE — Op Note (Unsigned)
NAME: Margaret [name], TRULSON MEDICAL RECORD NO: [id number] ACCOUNT NO: [id number] DATE OF BIRTH: Nov 01, 1940 FACILITY: [facility] LOCATION: [facility] PHYSICIAN: Alexis Frock, MD  Operative Report   DATE OF PROCEDURE: 06/03/2022  PREOPERATIVE DIAGNOSES: 1.  Enlarging left renal mass. 2.  Ventral hernia.  PROCEDURES:   1.  Robotic-assisted laparoscopic left partial nephrectomy. 2.  Intraoperative ultrasound interpretation. 3.  Open ventral hernia repair.  ESTIMATED BLOOD LOSS:  716 mL  COMPLICATIONS:  None.  SPECIMENS:   1.  Left renal mass. 2.  Final margin left renal mass for permanent pathology.  FINDINGS:   1.  Predominantly cystic exophytic left renal mass. 2.  Ventral hernia containing omentum fascial defect approximately 3 cm just superior to the umbilicus.  ASSISTANT:  Debbrah Alar, PA.  DRAINS:   1.  Jackson-Pratt drain to bulb suction. 2.  Foley catheter to straight drain.  FINDINGS:  Two artery one vein left renal vascular anatomy with multiple lumbar veins.  INDICATIONS:  This is 82 year old lady with history of small left renal mass for several years.  She has been on surveillance of this.  However, the mass was continued to grow not greater than 3 cm.  She does have some surgical and mild cognitive  comorbidity, but a good functional status and very good family support.  Options were discussed for further management including continued surveillance protocols versus ablative therapies versus surgical extirpation and she wished to proceed with  surgical extirpation with curative intent with partial nephrectomy.  She also has a known ventral hernia fat-containing on imaging times several.  She wished to have this repaired concomitantly.  Informed consent was obtained and placed in medical  record.  PROCEDURE IN DETAIL:  The patient was verified and identified.  Procedure being left partial nephrectomy with ventral hernia repair was confirmed.  Procedure timeout was  performed.  Intravenous antibiotics were administered.  General endotracheal  anesthesia induced.  The patient was placed into the left side up full flank position and pulling 15 degrees of table flexion, superior arm elevator, axillary roll, sequential compression devices, bottom leg bent, top leg straight.  Beanbag was deployed.   She was further fastened to operating table using 3-inch tape over foam padding. Foley catheter had already been placed per urethra to straight drain.  Sterile field was created, prepped and draped the patient's entire left flank and abdomen using  chlorhexidine gluconate.  Next, a high flow, low pressure, pneumoperitoneum was obtained using Veress technique left lower quadrant, having passed the aspiration and drop test.  An 8 mm robotic camera port was then placed in position approximately 4  fingerbreadths superolateral to the umbilicus.  Laparoscopic examination of peritoneal cavity did reveal some relatively loose adhesions just omental in the area of the known ventral hernia.  Hernia defect was approximately 3 cm.  This was just  supraumbilical.  Additional ports were placed as follows:  Left far lateral 8 mm robotic port, approximately 4 fingerbreadths superomedial to the anterior iliac spine, left paramedian inferior robotic port, approximately 1 handbreadth superior to pubic  ramus, left superior subcostal 8 mm robotic port and two 12 mm assistant port sites in the midline and inferior non-AirSeal type purposely directly through the supraumbilical ventral hernia and another approximately 2 fingerbreadths superior to the  planned camera port.  Robot was docked and passed the electronic checks.  Initial attention was directed at development of the retroperitoneum.  Incision made lateral to the descending colon from the area of the splenic flexure towards  the area of the  internal ring.  The colon was quite tethered to the splenic flexure and did require significant  mobilization.  Lateral splenic attachments were taken down to better open the space between the spleen, pancreas and anterior surface of Gerota's fascia.   Lower pole of kidney area was identified, placed on gentle lateral traction.  Dissection proceeded medial to this. The ureter and gonadal vessels were encountered placed on gentle lateral traction.  Dissection proceeded within the triangle of these  structures and the psoas musculature towards the area of the renal hilum.  Renal hilum consisted of a 2 artery one vein left renovascular anatomy.  There were 2 significant lumbar vessels that did make for a non-ideal window to the arterial trunks.  As  such, these lumbar vessels were controlled using vascular stapler.  This provided a much better open window towards the area of the two renal arteries, which were then circumferentially mobilized and marked vessel loop with a goal of enbloc clamping.   The attention was then directed at identification of the mass.  Dissection proceeded directly on to rim of the kidney in anterolateral location and mass in question was identified moving somewhat superior lateral aspect of the kidney, it was quite  exophytic and very, very cystic-appearing.  It was freed up circumferentially and then interrogated with intraoperative ultrasound drop down probe.  Intraoperative ultrasound revealed a predominantly cystic with some nodular area component exophytic mass.  The interface between the kidney and the mass did appear to be essentially one very thin walled cyst.  There were no obvious nodular extensions  deep into the kidney.  As such, combination of visual cues and ultrasound cues were used to score the plane of the presumed partial nephrectomy. Warm ischemia was then achieved by placing 2 bulldog clamps on the en bloc 2 arteries and partial nephrectomy  was carefully performed using cold scissors keeping what appeared to be a rim of normal parenchyma with the partial  nephrectomy specimen.  Notably, the deep central aspect of this interface was a simple cyst, which was entered and the deep cyst wall  completely ablated using coagulation current in a separate deep margin was sent from this.  Additional coagulation current was used as several small arterials and first layer renorrhaphy was performed using a running 3-0 V-Loc suture oversewing several  small venous sinuses.  A Surgicel bolster was applied and then a second renorrhaphy layer of large Vicryl sandwiched between Hem-o-loks and Lapra-Tys was applied.  Thus performing parenchymal apposition.  This resulted in excellent hemostasis.  Sponge  and needle counts were correct.  The specimen was placed in EndoCatch bag for later retrieval.  A small piece of Surgicel was placed in the interface of the area of the spleen and upper pole area that did require significant mobilization.  A closed  suction drain was brought out the previous left lateral most robotic site into the peritoneal cavity.  Robot was then undocked.  The superior most assistant port was closed with fascia using Carter-Thomason suture passer and Novafil nonabsorbable suture.   The specimen was retrieved by extending the previous inferior camera port site for a total distance of approximately 3 cm.  This was purposely then anteriorly developed the edges of the ventral hernia.  Specimen was removed from this set aside for permanent pathology.  Attention was turned to the ventral hernia repair. The fascial abnormalities were carefully mobilized circumferentially and was reapproximated using interrupted Novafil x5, which revealed  an excellent fascial apposition and resolution of hernia.  Second layer of Scarpa's reapproximated using running Vicryl.  All incision sites were infiltrated with dilute lipolyzed Marcaine and closed at the level of the skin using subcuticular Monocryl  followed by Dermabond.  Procedure was then terminated.  The patient tolerated  procedure well, no immediate perioperative complications.  The patient was taken to postanesthesia care unit in stable condition.  Please note, first assistant, Debbrah Alar, was crucial for all portions surgery today. She provided invaluable retraction, suctioning, robotic instrument exchange, vascular clipping, vascular clamping and general first assistance.   PUS D: 06/03/2022 11:22:19 am T: 06/03/2022 1:17:00 pm  JOB: [id number]

## 2022-06-03 NOTE — Anesthesia Postprocedure Evaluation (Signed)
Anesthesia Post Note  Patient: [name]  Procedure(s) Performed: XI ROBOTIC ASSITED PARTIAL NEPHRECTOMY WITH ULTRASOUND AND VENTRAL HERNIA REPAIR (Left)     Patient location during evaluation: PACU Anesthesia Type: General Level of consciousness: awake and sedated Pain management: pain level controlled Vital Signs Assessment: post-procedure vital signs reviewed and stable Respiratory status: spontaneous breathing Cardiovascular status: stable Postop Assessment: no apparent nausea or vomiting Anesthetic complications: no  No notable events documented.  Last Vitals:  Vitals:   06/03/22 1245 06/03/22 1300  BP: (!) 156/78 (!) 151/68  Pulse: 78 66  Resp: 16 13  Temp:    SpO2: 95% 95%    Last Pain:  Vitals:   06/03/22 1230  TempSrc:   PainSc: Asleep   Pain Goal:                   Huston Foley

## 2022-06-03 NOTE — Anesthesia Procedure Notes (Signed)
Procedure Name: Intubation Date/Time: 06/03/2022 8:36 AM  Performed by: Raenette Rover, CRNAPre-anesthesia Checklist: Patient identified, Emergency Drugs available, Suction available and Patient being monitored Patient Re-evaluated:Patient Re-evaluated prior to induction Oxygen Delivery Method: Circle system utilized Preoxygenation: Pre-oxygenation with 100% oxygen Induction Type: IV induction Ventilation: Mask ventilation without difficulty Laryngoscope Size: Mac and 3 Grade View: Grade I Tube type: Oral Tube size: 7.0 mm Number of attempts: 1 Airway Equipment and Method: Stylet Placement Confirmation: ETT inserted through vocal cords under direct vision, positive ETCO2 and breath sounds checked- equal and bilateral Secured at: 19 cm Tube secured with: Tape Dental Injury: Teeth and Oropharynx as per pre-operative assessment

## 2022-06-03 NOTE — Brief Op Note (Signed)
06/03/2022  11:12 AM  PATIENT:  Margaret [name]  82 y.o. female  PRE-OPERATIVE DIAGNOSIS:  RENAL MASS, VENTRAL HERNIA  POST-OPERATIVE DIAGNOSIS:  RENAL MASS, VENTRAL HERNIA  PROCEDURE:  Procedure(s) with comments: XI ROBOTIC ASSITED PARTIAL NEPHRECTOMY WITH ULTRASOUND AND VENTRAL HERNIA REPAIR (Left) - 3 HRS  SURGEON:  Surgeon(s) and Role:    Alexis Frock, MD - Primary  PHYSICIAN ASSISTANT:   ASSISTANTS: Debbrah Alar PA   ANESTHESIA:   local and general  EBL:  150 mL   BLOOD ADMINISTERED:none  DRAINS:  1 - JP to bulb; 2 - Foley to gravity    LOCAL MEDICATIONS USED:  MARCAINE     SPECIMEN:  Source of Specimen:  1 - left renal mass; 2- final margin   DISPOSITION OF SPECIMEN:  PATHOLOGY  COUNTS:  YES  TOURNIQUET:  * No tourniquets in log *  DICTATION: .Other Dictation: Dictation Number [id number]  PLAN OF CARE: Admit to inpatient   PATIENT DISPOSITION:  PACU - hemodynamically stable.   Delay start of Pharmacological VTE agent (>24hrs) due to surgical blood loss or risk of bleeding: yes

## 2022-06-03 NOTE — Transfer of Care (Signed)
Immediate Anesthesia Transfer of Care Note  Patient: Margaret [name]  Procedure(s) Performed: XI ROBOTIC ASSITED PARTIAL NEPHRECTOMY WITH ULTRASOUND AND VENTRAL HERNIA REPAIR (Left)  Patient Location: PACU  Anesthesia Type:General  Level of Consciousness: awake, alert , oriented, drowsy, and patient cooperative  Airway & Oxygen Therapy: Patient Spontanous Breathing and Patient connected to nasal cannula oxygen  Post-op Assessment: Report given to RN and Post -op Vital signs reviewed and stable  Post vital signs: Reviewed and stable  Last Vitals:  Vitals Value Taken Time  BP 145/93 06/03/22 1121  Temp    Pulse 80 06/03/22 1124  Resp 25 06/03/22 1124  SpO2 100 % 06/03/22 1124  Vitals shown include unvalidated device data.  Last Pain:  Vitals:   06/03/22 0740  TempSrc: Oral         Complications: No notable events documented.

## 2022-06-04 ENCOUNTER — Encounter (HOSPITAL_COMMUNITY): Payer: Self-pay | Admitting: Urology

## 2022-06-04 DIAGNOSIS — N819 Female genital prolapse, unspecified: Secondary | ICD-10-CM | POA: Diagnosis not present

## 2022-06-04 DIAGNOSIS — N2889 Other specified disorders of kidney and ureter: Secondary | ICD-10-CM | POA: Diagnosis not present

## 2022-06-04 DIAGNOSIS — I129 Hypertensive chronic kidney disease with stage 1 through stage 4 chronic kidney disease, or unspecified chronic kidney disease: Secondary | ICD-10-CM | POA: Diagnosis not present

## 2022-06-04 DIAGNOSIS — Z85828 Personal history of other malignant neoplasm of skin: Secondary | ICD-10-CM | POA: Diagnosis not present

## 2022-06-04 DIAGNOSIS — K439 Ventral hernia without obstruction or gangrene: Secondary | ICD-10-CM | POA: Diagnosis not present

## 2022-06-04 DIAGNOSIS — I2510 Atherosclerotic heart disease of native coronary artery without angina pectoris: Secondary | ICD-10-CM | POA: Diagnosis not present

## 2022-06-04 LAB — BASIC METABOLIC PANEL
Anion gap: 6 (ref 5–15)
BUN: 19 mg/dL (ref 8–23)
CO2: 27 mmol/L (ref 22–32)
Calcium: 8 mg/dL — ABNORMAL LOW (ref 8.9–10.3)
Chloride: 103 mmol/L (ref 98–111)
Creatinine, Ser: 1.02 mg/dL — ABNORMAL HIGH (ref 0.44–1.00)
GFR, Estimated: 55 mL/min — ABNORMAL LOW (ref >60)
Glucose, Bld: 107 mg/dL — ABNORMAL HIGH (ref 70–99)
Potassium: 4 mmol/L (ref 3.5–5.1)
Sodium: 136 mmol/L (ref 135–145)

## 2022-06-04 LAB — SURGICAL PATHOLOGY

## 2022-06-04 LAB — HEMOGLOBIN AND HEMATOCRIT, BLOOD
HCT: 36.6 % (ref 36.0–46.0)
Hemoglobin: 11.8 g/dL — ABNORMAL LOW (ref 12.0–15.0)

## 2022-06-04 NOTE — Care Management CC44 (Signed)
Condition Code 44 Documentation Completed  Patient Details  Name: Margaret [name] MRN: [id number] Date of Birth: 12/13/1940   Condition Code 44 given:  Yes Patient signature on Condition Code 44 notice:  Yes Documentation of 2 MD's agreement:  Yes Code 44 added to claim:  Yes    Dessa Phi, RN 06/04/2022, 10:45 AM

## 2022-06-04 NOTE — Progress Notes (Signed)
Patient to be discharged this evening. Patient given discharge instructions including all discharge Medications and schedules for these Medications. Understanding verbalized and discharge AVS with the Patient at discharge

## 2022-06-04 NOTE — Care Management Obs Status (Signed)
[place] NOTIFICATION   Patient Details  Name: CACEY WILLOW MRN: [id number] Date of Birth: 1940-09-10   Medicare Observation Status Notification Given:  Yes    MahabirJuliann Pulse, RN 06/04/2022, 10:45 AM

## 2022-06-04 NOTE — TOC Transition Note (Signed)
Transition of Care [facility]) - CM/SW Discharge Note   Patient Details  Name: Margaret [name] MRN: [id number] Date of Birth: 01-24-1941  [facility] CM/SW Contact:  Dessa Phi, RN Phone Number: 06/04/2022, 10:44 AM   Clinical Narrative: d/c home No CM needs.      Final next level of care: Home/Self Care Barriers to Discharge: No Barriers Identified   Patient Goals and CMS Choice CMS Medicare.gov Compare Post Acute Care list provided to:: Patient Choice offered to / list presented to : Patient  Discharge Placement                         Discharge Plan and Services Additional resources added to the After Visit Summary for     Discharge Planning Services: CM Consult Post Acute Care Choice: NA                               Social Determinants of Health (SDOH) Interventions SDOH Screenings   Food Insecurity: No Food Insecurity (06/03/2022)  Housing: Low Risk  (06/03/2022)  Transportation Needs: No Transportation Needs (06/03/2022)  Utilities: Not At Risk (06/03/2022)  Alcohol Screen: Low Risk  (08/05/2021)  Depression (PHQ2-9): Low Risk  (08/05/2021)  Financial Resource Strain: Low Risk  (08/05/2021)  Social Connections: Socially Isolated (08/05/2021)  Stress: No Stress Concern Present (08/05/2021)  Tobacco Use: Medium Risk (06/04/2022)     Readmission Risk Interventions     No data to display

## 2022-06-04 NOTE — Discharge Summary (Signed)
[facility] Discharge Summary  Admit date: 06/03/2022  Discharge date and time: 06/04/22   Discharge to: Home  Discharge Service: Urology  Discharge Attending Physician:  Dr. Tresa Moore  Discharge  Diagnoses: Renal mass  Secondary Diagnosis: Principal Problem:   Renal mass   OR Procedures: Procedure(s): XI ROBOTIC ASSITED PARTIAL NEPHRECTOMY WITH ULTRASOUND AND VENTRAL HERNIA REPAIR 06/03/2022   Ancillary Procedures: None   Discharge Day Services: The patient was seen and examined by the Urology team both in the morning and immediately prior to discharge.  Vital signs and laboratory values were stable and within normal limits.  The physical exam was benign and unchanged and all surgical wounds were examined.  Discharge instructions were explained and all questions answered. JP removed prior to discharge as output minimal and non-foul. Path pending at discharge. Hgb 11.8, Cr 1.02.   Subjective  No acute events overnight. Pain Controlled. No fever or chills.  Objective Patient Vitals for the past 8 hrs:  BP Temp Temp src Pulse Resp SpO2  06/04/22 0437 129/67 98.6 F (37 C) Oral 64 18 92 %  06/03/22 2344 131/67 98.1 F (36.7 C) Oral (!) 57 18 93 %   Total I/O In: -  Out: 120 [Drains:120]  General Appearance:        No acute distress Lungs:                       Normal work of breathing on room air Heart:                                Regular rate and rhythm Abdomen:                         Soft, non-tender, non-distended Extremities:                      Warm and well perfused   Hospital Course:  The patient underwent left partial nephrectomy on 06/03/2022.  The patient tolerated the procedure well, was extubated in the OR, and afterwards was taken to the PACU for routine post-surgical care. When stable the patient was transferred to the floor.   The patient did well postoperatively.  The patient's diet was slowly advanced and at the time of discharge was tolerating a regular  diet.  The patient was discharged home 1 Day Post-Op, at which point was tolerating a regular solid diet, was able to void spontaneously, have adequate pain control with P.O. pain medication, and could ambulate without difficulty. The patient will follow up with [name] for post op check.   Condition at Discharge: Improved  Discharge Medications:

## 2022-06-04 NOTE — Discharge Instructions (Signed)

## 2022-06-05 ENCOUNTER — Other Ambulatory Visit (HOSPITAL_COMMUNITY): Payer: Self-pay

## 2022-06-08 ENCOUNTER — Other Ambulatory Visit: Payer: Self-pay | Admitting: Family Medicine

## 2022-06-08 DIAGNOSIS — F411 Generalized anxiety disorder: Secondary | ICD-10-CM

## 2022-06-12 ENCOUNTER — Other Ambulatory Visit (HOSPITAL_COMMUNITY): Payer: Self-pay

## 2022-06-19 ENCOUNTER — Ambulatory Visit (INDEPENDENT_AMBULATORY_CARE_PROVIDER_SITE_OTHER): Payer: Medicare Other | Admitting: Podiatry

## 2022-06-19 DIAGNOSIS — M21611 Bunion of right foot: Secondary | ICD-10-CM

## 2022-06-19 DIAGNOSIS — Q828 Other specified congenital malformations of skin: Secondary | ICD-10-CM

## 2022-06-19 NOTE — Progress Notes (Signed)
Subjective: Chief Complaint  Patient presents with   Callouses    BILATERAL FEET    82 year old female presents today with concerns.  She said the lesions are causing pain and she also wants to discuss bunion surgery on her right foot had the bunions causing quite a bit of pain.  She tried shoe modifications, offloading hide with a significant improvement.  Objective: General: No acute distress, AAOx3  DP/PT pulses palpable 2/4, CRT < 3 sec to all digits.   Sensation decreased with Semmes Weinstein monofilament Right foot: Annual hyperkeratotic lesion lateral plantar foot.  No underlying ulceration drainage or signs of infection.  Significant bunion present on the right foot.  Tenderness directly on the bunion area.  No other areas of pinpoint tenderness. Left foot: Punctate annular hyperkeratotic lesions noted left foot metatarsal, plantar midfoot left side as well as the right fifth metatarsal base and submetatarsal area.  No ongoing ulceration drainage or signs of infection. Prominent metatarsal heads with digital deformity. No pain with calf compression, swelling, warmth, erythema.   Assessment and Plan:  Chronic skin lesions bilaterally, neuropathy; rigt bunion  -Sharply debrided calluses x 3 without any complications or bleeding.  I held off on the [place] application today.  Will likely do this next appointment after her surgery that she is scheduled for (due to kidney cancer).  -We discussed bunion surgery in the right foot.  Likely need to proceed with first BJ arthrodesis we discussed the procedure as well as postoperative course.  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  Oriented to reschedule 128. -Monitor for any clinical signs or symptoms of infection and directed to call the office immediately should any occur or go to the ER.  Return in about 3 weeks (around 07/10/2022) for corns/bunion  surgery consult.  X-ray next appointment right foot  Trula Slade DPM

## 2022-06-22 DIAGNOSIS — R300 Dysuria: Secondary | ICD-10-CM | POA: Diagnosis not present

## 2022-06-29 ENCOUNTER — Other Ambulatory Visit: Payer: Self-pay | Admitting: Family Medicine

## 2022-06-29 DIAGNOSIS — E785 Hyperlipidemia, unspecified: Secondary | ICD-10-CM

## 2022-07-06 ENCOUNTER — Ambulatory Visit (INDEPENDENT_AMBULATORY_CARE_PROVIDER_SITE_OTHER): Payer: Medicare Other

## 2022-07-06 ENCOUNTER — Ambulatory Visit: Payer: Medicare Other | Admitting: Podiatry

## 2022-07-06 ENCOUNTER — Ambulatory Visit (INDEPENDENT_AMBULATORY_CARE_PROVIDER_SITE_OTHER): Payer: Medicare Other | Admitting: Podiatry

## 2022-07-06 DIAGNOSIS — M21611 Bunion of right foot: Secondary | ICD-10-CM

## 2022-07-06 DIAGNOSIS — M2061 Acquired deformities of toe(s), unspecified, right foot: Secondary | ICD-10-CM

## 2022-07-06 DIAGNOSIS — Q828 Other specified congenital malformations of skin: Secondary | ICD-10-CM | POA: Diagnosis not present

## 2022-07-06 NOTE — Patient Instructions (Signed)
Pre-Operative Instructions  Congratulations, you have decided to take an important step to improving your quality of life.  You can be assured that the doctors of [facility] will be with you every step of the way.  Plan to be at the surgery center/hospital at least 1 (one) hour prior to your scheduled time unless otherwise directed by the surgical center/hospital staff.  You must have a responsible adult accompany you, remain during the surgery and drive you home.  Make sure you have directions to the surgical center/hospital and know how to get there on time. For hospital based surgery you will need to obtain a history and physical form from your family physician within 1 month prior to the date of surgery- we will give you a form for you primary physician.  We make every effort to accommodate the date you request for surgery.  There are however, times where surgery dates or times have to be moved.  We will contact you as soon as possible if a change in schedule is required.   No Aspirin/Ibuprofen for one week before surgery.  If you are on aspirin, any non-steroidal anti-inflammatory medications (Mobic, Aleve, Ibuprofen) you should stop taking it 7 days prior to your surgery.  You make take Tylenol  For pain prior to surgery.  Medications- If you are taking daily heart and blood pressure medications, seizure, reflux, allergy, asthma, anxiety, pain or diabetes medications, make sure the surgery center/hospital is aware before the day of surgery so they may notify you which medications to take or avoid the day of surgery. No food or drink after midnight the night before surgery unless directed otherwise by surgical center/hospital staff. No alcoholic beverages 24 hours prior to surgery.  No smoking 24 hours prior to or 24 hours after surgery. Wear loose pants or shorts- loose enough to fit over bandages, boots, and casts. No slip on shoes, sneakers are best. Bring your boot with you to the  surgery center/hospital.  Also bring crutches or a walker if your physician has prescribed it for you.  If you do not have this equipment, it will be provided for you after surgery. If you have not been contracted by the surgery center/hospital by the day before your surgery, call to confirm the date and time of your surgery. Leave-time from work may vary depending on the type of surgery you have.  Appropriate arrangements should be made prior to surgery with your employer. Prescriptions will be provided immediately following surgery by your doctor.  Have these filled as soon as possible after surgery and take the medication as directed. Remove nail polish on the operative foot. Wash the night before surgery.  The night before surgery wash the foot and leg well with the antibacterial soap provided and water paying special attention to beneath the toenails and in between the toes.  Rinse thoroughly with water and dry well with a towel.  Perform this wash unless told not to do so by your physician.  Enclosed: 1 Ice pack (please put in freezer the night before surgery)   1 Hibiclens skin cleaner   Pre-op Instructions  If you have any questions regarding the instructions, do not hesitate to call our office at any point during this process.   [place]: [address] [place], [place] [address] [phone]  [place]: [address] [place], [place] [address] [phone]  Dr. Claris Guymon, DPM  

## 2022-07-08 ENCOUNTER — Ambulatory Visit: Payer: Medicare Other | Admitting: Cardiology

## 2022-07-08 NOTE — Progress Notes (Signed)
Subjective: Chief Complaint  Patient presents with   Callouses    corns/bunion surgery consult    82 year old female presents today with concerns.  She states that she was proceed with bunion surgery on the right foot as this has been "killing her".  She has been developing a corn on the bunion as well.  She has had bunion pains for quite some time this is progressively gotten worse.  She tried shoe modifications, offloading and padding.    Objective: General: No acute distress, AAOx3  DP/PT pulses palpable 2/4, CRT < 3 sec to all digits.   Sensation decreased with Semmes Weinstein monofilament Right foot: Annual hyperkeratotic lesion lateral plantar foot.  No underlying ulceration drainage or signs of infection.  Significant bunion present on the right foot.  Tenderness directly on the bunion area.  No other areas of pinpoint tenderness.  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 Left foot: Punctate annular hyperkeratotic lesions noted left foot metatarsal, plantar midfoot left side as well as the right fifth metatarsal base and submetatarsal area.  No ongoing ulceration drainage or signs of infection. Prominent metatarsal heads with digital deformity. No pain with calf compression, swelling, warmth, erythema.   Assessment and Plan:  Chronic skin lesions bilaterally, neuropathy; rigt bunion  -X-rays were obtained reviewed of the right foot.  Significant bunion present previous shave ostectomy noted. -We discussed the surgery was postoperative course were discussed with her options for surgery.  Given her deformity would recommend a first MPJ arthrodesis.  We discussed she would need to be nonweightbearing for minimum 2 weeks and should be in a boot for 6 to 8 weeks.  After discussion she agrees with this and she wants to proceed. -The incision placement as well as the postoperative course was discussed with the patient. I discussed risks of the surgery which  include, but not limited to, infection, bleeding, pain, swelling, need for further surgery, delayed or nonhealing, painful or ugly scar, numbness or sensation changes, over/under correction, recurrence, transfer lesions, further deformity, hardware failure, DVT/PE, loss of toe/foot. Patient understands these risks and wishes to proceed with surgery. The surgical consent was reviewed with the patient all 3 pages were signed. No promises or guarantees were given to the outcome of the procedure. All questions were answered to the best of my ability. Before the surgery the patient was encouraged to call the office if there is any further questions. The surgery will be performed at the [facility] on an outpatient basis. -As a courtesy debride the calluses and complications of bleeding  Trula Slade DPM

## 2022-07-15 DIAGNOSIS — L814 Other melanin hyperpigmentation: Secondary | ICD-10-CM | POA: Diagnosis not present

## 2022-07-15 DIAGNOSIS — D225 Melanocytic nevi of trunk: Secondary | ICD-10-CM | POA: Diagnosis not present

## 2022-07-15 DIAGNOSIS — L821 Other seborrheic keratosis: Secondary | ICD-10-CM | POA: Diagnosis not present

## 2022-07-15 DIAGNOSIS — D2261 Melanocytic nevi of right upper limb, including shoulder: Secondary | ICD-10-CM | POA: Diagnosis not present

## 2022-07-15 DIAGNOSIS — D2272 Melanocytic nevi of left lower limb, including hip: Secondary | ICD-10-CM | POA: Diagnosis not present

## 2022-07-15 DIAGNOSIS — L578 Other skin changes due to chronic exposure to nonionizing radiation: Secondary | ICD-10-CM | POA: Diagnosis not present

## 2022-07-15 DIAGNOSIS — Z85820 Personal history of malignant melanoma of skin: Secondary | ICD-10-CM | POA: Diagnosis not present

## 2022-07-22 ENCOUNTER — Ambulatory Visit: Payer: Medicare Other | Admitting: Obstetrics & Gynecology

## 2022-07-23 ENCOUNTER — Other Ambulatory Visit: Payer: Self-pay | Admitting: Family Medicine

## 2022-07-27 ENCOUNTER — Other Ambulatory Visit: Payer: Self-pay | Admitting: Podiatry

## 2022-07-29 ENCOUNTER — Other Ambulatory Visit: Payer: Self-pay | Admitting: Podiatry

## 2022-07-29 ENCOUNTER — Encounter: Payer: Self-pay | Admitting: Podiatry

## 2022-07-29 DIAGNOSIS — M21611 Bunion of right foot: Secondary | ICD-10-CM | POA: Diagnosis not present

## 2022-07-29 DIAGNOSIS — M19071 Primary osteoarthritis, right ankle and foot: Secondary | ICD-10-CM | POA: Diagnosis not present

## 2022-07-29 MED ORDER — HYDROCODONE-ACETAMINOPHEN 5-325 MG PO TABS: 1.0000 | ORAL_TABLET | Freq: Four times a day (QID) | ORAL | 0 refills | Status: DC | PRN

## 2022-07-29 MED ORDER — RIVAROXABAN 10 MG PO TABS: 10.0000 mg | ORAL_TABLET | Freq: Every day | ORAL | 0 refills | Status: DC

## 2022-07-29 MED ORDER — AMOXICILLIN-POT CLAVULANATE 875-125 MG PO TABS: 1.0000 | ORAL_TABLET | Freq: Two times a day (BID) | ORAL | 0 refills | Status: DC

## 2022-07-29 NOTE — Progress Notes (Signed)
Post op medications sent 

## 2022-07-31 ENCOUNTER — Telehealth: Payer: Self-pay

## 2022-07-31 DIAGNOSIS — Z4789 Encounter for other orthopedic aftercare: Secondary | ICD-10-CM | POA: Diagnosis not present

## 2022-08-01 ENCOUNTER — Telehealth: Payer: Self-pay | Admitting: Podiatry

## 2022-08-01 MED ORDER — ASPIRIN 325 MG PO TBEC: 325.0000 mg | DELAYED_RELEASE_TABLET | Freq: Two times a day (BID) | ORAL | 0 refills | Status: AC

## 2022-08-01 NOTE — Telephone Encounter (Signed)
PT from [facility] called and said patient not on xarelto due to cost and insurance denial. D/w Dr [name], relayed to patient and daughter to start ASA '325mg'$  BID in replacement of this

## 2022-08-03 ENCOUNTER — Ambulatory Visit (INDEPENDENT_AMBULATORY_CARE_PROVIDER_SITE_OTHER): Payer: Medicare Other | Admitting: Podiatry

## 2022-08-03 ENCOUNTER — Ambulatory Visit (INDEPENDENT_AMBULATORY_CARE_PROVIDER_SITE_OTHER): Payer: Medicare Other

## 2022-08-03 ENCOUNTER — Other Ambulatory Visit: Payer: Self-pay | Admitting: Podiatry

## 2022-08-03 DIAGNOSIS — Z9889 Other specified postprocedural states: Secondary | ICD-10-CM

## 2022-08-03 DIAGNOSIS — M21611 Bunion of right foot: Secondary | ICD-10-CM

## 2022-08-03 DIAGNOSIS — M2061 Acquired deformities of toe(s), unspecified, right foot: Secondary | ICD-10-CM

## 2022-08-03 NOTE — Telephone Encounter (Signed)
No further information is needed

## 2022-08-03 NOTE — Progress Notes (Unsigned)
Patient presents today for post op visit # 1, patient of Dr. [name]   POV # 1 DOS 07/29/22 --- HALLUX MPJ FUSION RIGHT    Patient presents in non-weight bearing in her cam boot. Denies any falls or injury to the foot. Foot is slightly swollen. No signs of infection. No calf pain or shortness of breath. Bandages dry and intact. Incision is intact.  She is currently still taking post op antibiotics, 2 days left and she really hasn't had any pain. She hasn't taken any pain meds in 3 days. She couldn't afford the blood thinner prescription, but she was told she could take aspirin daily and she has been doing that.     Xrays taken today and reviewed by Dr. [name]. He did take a look at the foot today as well.   Foot redressed today and placed back in the boot. Reviewed icing and elevation. Patient will follow up with Dr. [name] for POV# 2 with new xrays on 08/14/22.  - Send appears to be healing well and well coapted with sutures intact.  Minimal edema.  No erythema or warmth.  There is no signs of infection.  Toe still some mild adductus present right bunion appears to be improved.  X-rays obtained reviewed.  3 views of the foot were obtained.  Hardware intact without complicating factors.  Difficult to the lateral view but based on the AP and oblique.  Hardware seems to be in appropriate position.  Trula Slade DPM

## 2022-08-04 ENCOUNTER — Telehealth: Payer: Self-pay | Admitting: Family Medicine

## 2022-08-04 DIAGNOSIS — Z4789 Encounter for other orthopedic aftercare: Secondary | ICD-10-CM | POA: Diagnosis not present

## 2022-08-04 NOTE — Telephone Encounter (Signed)
Contacted [facility] to schedule their annual wellness visit. Call back at later date: 09/14/2022  Sherol Dade; [facility] Direct Dial: [phone]

## 2022-08-10 ENCOUNTER — Other Ambulatory Visit (HOSPITAL_BASED_OUTPATIENT_CLINIC_OR_DEPARTMENT_OTHER): Payer: Self-pay | Admitting: Family Medicine

## 2022-08-10 DIAGNOSIS — Z1231 Encounter for screening mammogram for malignant neoplasm of breast: Secondary | ICD-10-CM

## 2022-08-10 DIAGNOSIS — Z4789 Encounter for other orthopedic aftercare: Secondary | ICD-10-CM | POA: Diagnosis not present

## 2022-08-13 ENCOUNTER — Ambulatory Visit: Payer: Medicare Other

## 2022-08-14 ENCOUNTER — Ambulatory Visit (INDEPENDENT_AMBULATORY_CARE_PROVIDER_SITE_OTHER): Payer: Medicare Other | Admitting: Podiatry

## 2022-08-14 ENCOUNTER — Ambulatory Visit (INDEPENDENT_AMBULATORY_CARE_PROVIDER_SITE_OTHER): Payer: Medicare Other

## 2022-08-14 DIAGNOSIS — M2061 Acquired deformities of toe(s), unspecified, right foot: Secondary | ICD-10-CM

## 2022-08-14 DIAGNOSIS — Z9889 Other specified postprocedural states: Secondary | ICD-10-CM

## 2022-08-21 ENCOUNTER — Ambulatory Visit (INDEPENDENT_AMBULATORY_CARE_PROVIDER_SITE_OTHER): Payer: Medicare Other | Admitting: Podiatry

## 2022-08-21 DIAGNOSIS — Z9889 Other specified postprocedural states: Secondary | ICD-10-CM

## 2022-08-21 DIAGNOSIS — M21611 Bunion of right foot: Secondary | ICD-10-CM

## 2022-08-22 NOTE — Progress Notes (Signed)
Subjective: Chief Complaint  Patient presents with   Routine Post Op    POV # 2 DOS 07/29/22 --- HALLUX MPJ FUSION RIGHT   82 year old female presents for above concerns.  She is underwent first major arthrodesis on June 20, 2022.  She states that she is feeling well.  She does not report any pain.  She presents today for possible suture removal.  No fevers or chills.  No chest pain or shortness of breath.   Objective: AAO x3, NAD DP/PT pulses palpable bilaterally, CRT less than 3 seconds Status post first MPJ arthrodesis on the right foot.  Incisions well coapted with sutures intact.  Minimal edema.  No erythema or warmth there is no drainage or pus or any signs of infection.  Arthrodesis site appears to be stable. No pain with calf compression, swelling, warmth, erythema  Assessment: Status post first MPJ arthrodesis  Plan: -All treatment options discussed with the patient including all alternatives, risks, complications.  -Repeat x-rays obtained reviewed.  Does appear the plate has seen off the phone side however the toe is in a better position and appear to be holding.  No evidence of acute fracture.  I reviewed the x-ray findings with the patient as well as friend. -Today I removed The sutures.  Left the remainder intact to facilitate healing.  Antibiotic ointment was applied followed by dressing.  We discussed that she can start to transition to partial weight-bear as tolerated.  Use ice, elevate as well as compression for the residual edema. -Monitor for any clinical signs or symptoms of infection and directed to call the office immediately should any occur or go to the ER. -Patient encouraged to call the office with any questions, concerns, change in symptoms.    Trula Slade DPM

## 2022-08-24 NOTE — Progress Notes (Signed)
Subjective: Chief Complaint  Patient presents with   Follow-up    POV # 3 DOS 07/29/22 --- HALLUX MPJ FUSION RIGHT (removing remaining sutures)    82 year old female presents for above concerns.  She is underwent first major arthrodesis on June 20, 2022.  She presents today for suture removal.  States that she is doing well and having significant pain.  She denies any fevers or chills.    Objective: AAO x3, NAD DP/PT pulses palpable bilaterally, CRT less than 3 seconds Status post first MPJ arthrodesis on the right foot.  Incisions well coapted with sutures intact.  Minimal edema.  No erythema or warmth there is no drainage or pus or any signs of infection.  Arthrodesis site appears to be stable.  No signs of infection healing well No pain with calf compression, swelling, warmth, erythema  Assessment: Status post first MPJ arthrodesis  Plan: -All treatment options discussed with the patient including all alternatives, risks, complications.  -Repeat x-rays obtained reviewed.  Does appear the plate has seen off the bone on the dorsal aspect however no change.   -Remove the remainder of the sutures today without any complications.  Antibiotic ointment and bandage applied.  She can start to wash with soap and water and apply a similar dressing.  Incisions healing well a signs of dehiscence or infection.  Steri-Strips applied for reinforcement.  Ice, elevation, compression of any residual edema.  Partial weight-bear as tolerated.    Return in about 2 weeks (around 09/04/2022) for Post-op visit, x-ray.  Trula Slade DPM

## 2022-08-27 ENCOUNTER — Encounter: Payer: Medicare Other | Admitting: Podiatry

## 2022-09-01 DIAGNOSIS — L821 Other seborrheic keratosis: Secondary | ICD-10-CM | POA: Diagnosis not present

## 2022-09-01 DIAGNOSIS — L570 Actinic keratosis: Secondary | ICD-10-CM | POA: Diagnosis not present

## 2022-09-07 ENCOUNTER — Ambulatory Visit: Payer: Medicare Other

## 2022-09-07 ENCOUNTER — Ambulatory Visit (INDEPENDENT_AMBULATORY_CARE_PROVIDER_SITE_OTHER): Payer: Medicare Other | Admitting: Podiatry

## 2022-09-07 DIAGNOSIS — M21619 Bunion of unspecified foot: Secondary | ICD-10-CM

## 2022-09-07 DIAGNOSIS — Z9889 Other specified postprocedural states: Secondary | ICD-10-CM | POA: Diagnosis not present

## 2022-09-09 NOTE — Progress Notes (Signed)
Subjective: No chief complaint on file.    82 year old female presents for evaluation of the right first major arthrodesis for bunion on the right foot.  She is walking in the cam boot.  She has no pain.  No injuries that she reports.  She has a callus on the left foot shows me to trim as it is causing pain.  No fevers or chills.  No other concerns.  Objective: AAO x3, NAD DP/PT pulses palpable bilaterally, CRT less than 3 seconds Status post first MPJ arthrodesis on the right foot clinically does appear the big toe is shifted somewhat laterally.  She is having no pain at surgical site.  The incision is well coapted without any evidence of dehiscence.  There is minimal edema.  There is no erythema or warmth.  There is no obvious signs of infection noted today. Minimal callus formation left submetatarsal 3 without any underlying ulceration drainage or signs of infection. No pain with calf compression, swelling, warmth, erythema  Assessment: Status post first MPJ arthrodesis  Plan: -All treatment options discussed with the patient including all alternatives, risks, complications.  -X-rays obtained reviewed.  3 views of the foot were obtained.  Hardware intact but there does remain some shifting noted on the hallux. -I reviewed the x-ray findings with the patient as well as her friend who accompanied her today.  Not been any pain.  Discussed possible need for revision but she seems to be okay with physician today no pain.  She has not wanted another surgery right now.  She may need to in the future if symptoms persist where she has pain but for now we will continue with this heal.  Continue cam boot, ice, elevation. -As a courtesy debrided the callus with any complications or bleeding.  Vivi Barrack DPM

## 2022-09-11 ENCOUNTER — Encounter (INDEPENDENT_AMBULATORY_CARE_PROVIDER_SITE_OTHER): Payer: Medicare Other | Admitting: Family Medicine

## 2022-09-11 DIAGNOSIS — R300 Dysuria: Secondary | ICD-10-CM

## 2022-09-11 MED ORDER — SULFAMETHOXAZOLE-TRIMETHOPRIM 800-160 MG PO TABS
1.0000 | ORAL_TABLET | Freq: Two times a day (BID) | ORAL | 0 refills | Status: DC
Start: 2022-09-11 — End: 2022-11-03

## 2022-09-11 NOTE — Telephone Encounter (Signed)
Please see the MyChart message reply(ies) for my assessment and plan.  The patient gave consent for this Medical Advice Message and is aware that it may result in a bill to their insurance company as well as the possibility that this may result in a co-payment or deductible. They are an established patient, but are not seeking medical advice exclusively about a problem treated during an in person or video visit in the last 7 days. I did not recommend an in person or video visit within 7 days of my reply.  I spent a total of 10 minutes cumulative time within 7 days through MyChart messaging Emersen Carroll, MD  

## 2022-09-21 ENCOUNTER — Ambulatory Visit (INDEPENDENT_AMBULATORY_CARE_PROVIDER_SITE_OTHER): Payer: Medicare Other | Admitting: Podiatry

## 2022-09-21 ENCOUNTER — Ambulatory Visit (INDEPENDENT_AMBULATORY_CARE_PROVIDER_SITE_OTHER): Payer: Medicare Other

## 2022-09-21 DIAGNOSIS — M21611 Bunion of right foot: Secondary | ICD-10-CM | POA: Diagnosis not present

## 2022-09-21 DIAGNOSIS — Q828 Other specified congenital malformations of skin: Secondary | ICD-10-CM | POA: Diagnosis not present

## 2022-09-21 DIAGNOSIS — M2061 Acquired deformities of toe(s), unspecified, right foot: Secondary | ICD-10-CM

## 2022-09-21 DIAGNOSIS — G629 Polyneuropathy, unspecified: Secondary | ICD-10-CM | POA: Diagnosis not present

## 2022-09-23 NOTE — Progress Notes (Signed)
Subjective: Chief Complaint  Patient presents with   Routine Post Op    POV # 3 DOS 07/29/22 --- HALLUX MPJ FUSION RIGHT    82 year old female presents for evaluation of the right first major arthrodesis for bunion on the right foot.  She is walking in the cam boot.  She is eager to get to the surgical shoe and to start driving.  She has not had any pain.  No swelling or any open sores that she reports and overall she has been doing well.  No fevers or chills that she reports.  No other concerns.   Objective: AAO x3, NAD DP/PT pulses palpable bilaterally, CRT less than 3 seconds Status post first MPJ arthrodesis on the right foot clinically does appear the big toe is shifted somewhat laterally.  Does not appear to be shifted anymore compared to last appointment.  Incisions well coapted.  Some amount of scab present on the proximal aspect incision but no evidence of dehiscence.  There is no surrounding erythema, ascending cellulitis.  There is no fluctuance or crepitation.  There is no malodor. Minimal callus formation left submetatarsal 3 without any underlying ulceration drainage or signs of infection.  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  There is no underlying ulceration drainage or signs of infection. No pain with calf compression, swelling, warmth, erythema  Assessment: Status post first MPJ arthrodesis  Plan: -All treatment options discussed with the patient including all alternatives, risks, complications.  -X-rays obtained reviewed.  3 views of the foot were obtained.  Hardware intact but there does remain some shifting noted on the hallux.  There is to be stable. -I again reviewed the x-rays with her.  She has been doing well without any pain.  Will transition to a surgical shoe.  Continue ice, elevate as well as compression.  Will hold off in any further surgery at this time and monitor. -Sharply debrided hyperkeratotic lesion x 2 without any complications or  bleeding.   Vivi Barrack DPM

## 2022-09-24 NOTE — Progress Notes (Deleted)
HPI: FU syncope. Previously seen by neurology and felt to have a seizure versus syncope. EEG and ambulatory EEG normal.  MRI with no significant abnormality.  At time of previous evaluation in October 2021 we felt that these were likely orthostatic mediated with component of increased vagal tone.  Her hydrochlorothiazide was discontinued and we asked her to increase hydration and sodium intake.  We also discussed compression hose.  Echocardiogram November 2021 showed normal LV function, grade 1 diastolic dysfunction, moderate left atrial enlargement, mild mitral regurgitation, trace aortic insufficiency.  Since last seen   Current Outpatient Medications  Medication Sig Dispense Refill   diclofenac Sodium (VOLTAREN) 1 % GEL Apply 2 g topically 4 (four) times daily as needed (pain).     hydrochlorothiazide (HYDRODIURIL) 12.5 MG tablet Take 0.5 tablets (6.25 mg total) by mouth daily as needed (elevated bp). (Patient taking differently: Take 6.25 mg by mouth daily.) 45 tablet 3   LORazepam (ATIVAN) 1 MG tablet TAKE 1 TABLET BY MOUTH TWICE A DAY AS NEEDED FOR ANXIETY **DO NOT COMBINE WITH ALCOHOL OR ANY OTHER SEDATING SUBSTANCES** 60 tablet 1   metoprolol succinate (TOPROL-XL) 25 MG 24 hr tablet TAKE 1 TABLET BY MOUTH DAILY 90 tablet 0   NONFORMULARY OR COMPOUNDED ITEM [facility]:  Peripheral Neuropathy - Bupivacaine 1%, Doxepin 3%, Gabapentin 6%, Pentoxifylline 3%, Topiramate 1%. Apply 1-2 grams to affected area 3-4 times daily. (Patient taking differently: Apply 1 application  topically 4 (four) times daily as needed (neuropathy/pain (feet)). [facility]:  Peripheral Neuropathy - Bupivacaine 1%, Doxepin 3%, Gabapentin 6%, Pentoxifylline 3%, Topiramate 1%. Apply 1-2 grams to affected area 3-4 times daily.) 100 each 11   omeprazole (PRILOSEC) 20 MG capsule Take 1 capsule (20 mg total) by mouth daily. 90 capsule 1   pravastatin (PRAVACHOL) 20 MG tablet TAKE 1 TABLET BY MOUTH DAILY 90  tablet 1   pregabalin (LYRICA) 150 MG capsule TAKE 1 CAPSULE BY MOUTH TWICE A DAY 60 capsule 2   sulfamethoxazole-trimethoprim (BACTRIM DS) 800-160 MG tablet Take 1 tablet by mouth 2 (two) times daily. 10 tablet 0   No current facility-administered medications for this visit.     Past Medical History:  Diagnosis Date   Anxiety    Arthritis    Cancer (HCC)    lt. foot melanoma   Chronic kidney disease    lt. kidney cyst   Coronary artery disease    GERD (gastroesophageal reflux disease)    History of healed stress fracture 2013   bilateral feet   Hx of phlebitis    reports remote hx of "superfical blood clots" never anticoagulated   Hypertension    Osteopenia    Pneumonia     Past Surgical History:  Procedure Laterality Date   CATARACT EXTRACTION W/ INTRAOCULAR LENS IMPLANT Bilateral    CHOLECYSTECTOMY N/A 09/19/2016   Procedure: LAPAROSCOPIC CHOLECYSTECTOMY POSSIBLE INTRAOPERATIVE CHOLANGIOGRAM;  Surgeon: Emelia Loron, MD;  Location: [facility] OR;  Service: General;  Laterality: N/A;   ERCP N/A 09/18/2016   Procedure: ENDOSCOPIC RETROGRADE CHOLANGIOPANCREATOGRAPHY (ERCP);  Surgeon: Sherrilyn Rist, MD;  Location: [facility] ENDOSCOPY;  Service: Endoscopy;  Laterality: N/A;   FOOT SURGERY     patient reports four foot surgeries   FOOT SURGERY Left    bunion, hammer toe surgery   FOOT SURGERY Right    shaved bunion, hammer toe correction   HERNIA REPAIR     KNEE ARTHROSCOPY Right 06/02/1995   REPLACEMENT TOTAL KNEE Left  REPLACEMENT TOTAL KNEE Left 10/30/2012   RETINAL DETACHMENT SURGERY     RETINAL DETACHMENT SURGERY Left 06/01/2006   ROBOTIC ASSITED PARTIAL NEPHRECTOMY Left 06/03/2022   Procedure: XI ROBOTIC ASSITED PARTIAL NEPHRECTOMY WITH ULTRASOUND AND VENTRAL HERNIA REPAIR;  Surgeon: Sebastian Ache, MD;  Location: [facility] ORS;  Service: Urology;  Laterality: Left;  3 HRS   THYROIDECTOMY, PARTIAL Right    THYROIDECTOMY, PARTIAL  06/02/2003   TOTAL KNEE ARTHROPLASTY Right  11/13/2019   Procedure: TOTAL KNEE ARTHROPLASTY SDDC;  Surgeon: Ollen Gross, MD;  Location: [facility]S;  Service: Orthopedics;  Laterality: Right;     Social History   Socioeconomic History   Marital status: Widowed    Spouse name: Not on file   Number of children: 3   Years of education: Not on file   Highest education level: Not on file  Occupational History   Not on file  Tobacco Use   Smoking status: Former    Packs/day: 0.25    Years: 15.00    Additional pack years: 0.00    Total pack years: 3.75    Types: Cigarettes    Quit date: 06/02/1979    Years since quitting: 43.3   Smokeless tobacco: Never  Vaping Use   Vaping Use: Never used  Substance and Sexual Activity   Alcohol use: Yes    Alcohol/week: 1.0 standard drink of alcohol    Types: 1 Glasses of wine per week    Comment: Occ   Drug use: No   Sexual activity: Not Currently  Other Topics Concern   Not on file  Social History Narrative   ** Merged History Encounter **       Married   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   3 children-[name] Dewaine Conger, son here, on daughter in [place]   7 grandchildren   1 great grandson    Left Handed   Social Determinants of Health   Financial Resource Strain: Low Risk  (08/05/2021)   Overall Financial Resource Strain (CARDIA)    Difficulty of Paying Living Expenses: Not hard at all  Food Insecurity: No Food Insecurity (06/03/2022)   Hunger Vital Sign    Worried About Running Out of Food in the Last Year: Never true    Ran Out of Food in the Last Year: Never true  Transportation Needs: No Transportation Needs (06/03/2022)   PRAPARE - Administrator, Civil Service (Medical): No    Lack of Transportation (Non-Medical): No  Physical Activity: Not on file  Stress: No Stress Concern Present (08/05/2021)   [employer] of Occupational Health - Occupational Stress Questionnaire    Feeling of Stress : Only a little  Social Connections:  Socially Isolated (08/05/2021)   Social Connection and Isolation Panel [NHANES]    Frequency of Communication with Friends and Family: Once a week    Frequency of Social Gatherings with Friends and Family: Never    Attends Religious Services: More than 4 times per year    Active Member of [employer] or Organizations: No    Attends Banker Meetings: Never    Marital Status: Widowed  Intimate Partner Violence: Not At Risk (06/03/2022)   Humiliation, Afraid, Rape, and Kick questionnaire    Fear of Current or Ex-Partner: No    Emotionally Abused: No    Physically Abused: No    Sexually Abused: No    Family History  Problem Relation Age of Onset   Heart disease Mother  Heart attack Mother    Heart disease Father    Diabetes Maternal Grandmother    Cancer Paternal Grandfather        stomach    ROS: no fevers or chills, productive cough, hemoptysis, dysphasia, odynophagia, melena, hematochezia, dysuria, hematuria, rash, seizure activity, orthopnea, PND, pedal edema, claudication. Remaining systems are negative.  Physical Exam: Well-developed well-nourished in no acute distress.  Skin is warm and dry.  HEENT is normal.  Neck is supple.  Chest is clear to auscultation with normal expansion.  Cardiovascular exam is regular rate and rhythm.  Abdominal exam nontender or distended. No masses palpated. Extremities show no edema. neuro grossly intact  ECG- personally reviewed  A/P  1 history of syncope-no recurrences since last office visit.  Continue increase fluid and sodium intake.  2 hypertension-  Olga Millers, MD

## 2022-09-30 ENCOUNTER — Ambulatory Visit: Payer: Medicare Other | Admitting: Cardiology

## 2022-10-02 ENCOUNTER — Encounter: Payer: Self-pay | Admitting: Family Medicine

## 2022-10-04 MED ORDER — AMOXICILLIN 500 MG PO CAPS: 2000.0000 mg | ORAL_CAPSULE | Freq: Once | ORAL | 0 refills | Status: AC

## 2022-10-05 ENCOUNTER — Encounter (HOSPITAL_BASED_OUTPATIENT_CLINIC_OR_DEPARTMENT_OTHER): Payer: Self-pay

## 2022-10-05 ENCOUNTER — Ambulatory Visit (HOSPITAL_BASED_OUTPATIENT_CLINIC_OR_DEPARTMENT_OTHER)
Admission: RE | Admit: 2022-10-05 | Discharge: 2022-10-05 | Disposition: A | Discharge status: Home / Self Care | Payer: Medicare Other | Source: Ambulatory Visit | Attending: Family Medicine | Admitting: Family Medicine

## 2022-10-05 DIAGNOSIS — Z1231 Encounter for screening mammogram for malignant neoplasm of breast: Secondary | ICD-10-CM | POA: Diagnosis not present

## 2022-10-07 ENCOUNTER — Ambulatory Visit: Payer: Medicare Other | Admitting: Obstetrics & Gynecology

## 2022-10-12 ENCOUNTER — Ambulatory Visit (INDEPENDENT_AMBULATORY_CARE_PROVIDER_SITE_OTHER): Payer: Medicare Other

## 2022-10-12 ENCOUNTER — Ambulatory Visit (INDEPENDENT_AMBULATORY_CARE_PROVIDER_SITE_OTHER): Payer: Medicare Other | Admitting: Podiatry

## 2022-10-12 ENCOUNTER — Encounter: Payer: Self-pay | Admitting: Podiatry

## 2022-10-12 DIAGNOSIS — M2061 Acquired deformities of toe(s), unspecified, right foot: Secondary | ICD-10-CM | POA: Diagnosis not present

## 2022-10-12 DIAGNOSIS — Z9889 Other specified postprocedural states: Secondary | ICD-10-CM

## 2022-10-17 NOTE — Progress Notes (Unsigned)
[facility] [address], [address] [place], [place] [address] [phone]  Date:  10/19/2022   Name:  Margaret [name]   DOB:  February 02, 1941   MRN:  [id number]  PCP:  Pearline Cables, MD    Chief Complaint: No chief complaint on file.   History of Present Illness:  Margaret [name] is a 82 y.o. very pleasant female patient who presents with the following:  Patient seen today for periodic follow-up- history of hypertension, GERD, osteopenia, melanoma of left foot, renal insufficiency, gallstone pancreatitis status post cholecystectomy, kidney cancer, prediabetes   Most recent visit with myself was in November And January her urologist Dr. 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  Per notes prognosis is excellent given clear margins  She was also seen by her dermatologist since her last visit, as well as podiatry to discuss her bunion  Accompanied today by her daughter and main caretaker [name] Patient Active Problem List   Diagnosis Date Noted   Renal mass 06/03/2022   Open wound of left side of back 01/20/2021   Comedone 11/18/2020   Lipoma of back 11/18/2020   Lymphedema 11/18/2020   Melanocytic nevi of right upper limb, including shoulder 11/18/2020   Personal history of malignant melanoma of skin 11/18/2020   Sebaceous cyst 11/18/2020   Seborrheic dermatitis 11/18/2020   Pre-diabetes 04/03/2020   OA (osteoarthritis) of knee 11/13/2019   Total knee replacement status, 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   Pain in right knee 08/03/2019   Fluid collection at surgical site 10/20/2018   Encounter for postoperative examination after surgery for malignant neoplasm 09/30/2018   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/2018    Plantar fasciitis of right foot 02/03/2017   Porokeratosis 02/03/2017   Acute pancreatitis due to calculus of common bile duct 09/17/2016   Acute gallstone pancreatitis    Calculus of gallbladder with acute cholecystitis and obstruction    LFT elevation    Stress fracture 01/01/2016   Insomnia 12/16/2015   Peripheral neuropathy 10/18/2015   Renal insufficiency 03/06/2015   HTN (hypertension) 03/06/2015   Osteopenia 03/06/2015   GERD (gastroesophageal reflux disease) 03/06/2015   Vaginal pessary present 03/06/2015   Metatarsalgia 07/13/2012   Increased frequency of urination 04/13/2012   DDD (degenerative disc disease), cervical 01/19/2012   Knee stiffness 01/19/2012   Low back pain 01/19/2012   Neck pain 01/19/2012   Pain of hand 01/19/2012   Hallux valgus 09/20/2011   Hyperkeratosis 08/13/2011   Anxiety disorder 07/06/2011   Depression 07/06/2011   Diverticulosis of large intestine 07/06/2011   Non-toxic multinodular goiter 07/06/2011   Atrophic vaginitis 10/24/2010   Dermatographic urticaria 09/17/2010   Inflamed seborrheic keratosis 09/17/2010   Closed fracture of fifth metatarsal bone 07/10/2010   Diagnosis unknown 02/27/2010   Hip pain 06/08/2008   Pulmonary nodule seen on imaging study 06/08/2008    Past Medical History:  Diagnosis Date   Anxiety    Arthritis    Cancer (HCC)    lt. foot melanoma   Chronic kidney disease    lt. kidney cyst   Coronary artery disease    GERD (gastroesophageal reflux disease)    History of healed stress fracture 2013   bilateral feet   Hx of  phlebitis    reports remote hx of "superfical blood clots" never anticoagulated   Hypertension    Osteopenia    Pneumonia     Past Surgical History:  Procedure Laterality Date   CATARACT EXTRACTION W/ INTRAOCULAR LENS IMPLANT Bilateral    CHOLECYSTECTOMY N/A 09/19/2016   Procedure: LAPAROSCOPIC CHOLECYSTECTOMY POSSIBLE INTRAOPERATIVE CHOLANGIOGRAM;  Surgeon: Emelia Loron, MD;   Location: [facility] OR;  Service: General;  Laterality: N/A;   ERCP N/A 09/18/2016   Procedure: ENDOSCOPIC RETROGRADE CHOLANGIOPANCREATOGRAPHY (ERCP);  Surgeon: Sherrilyn Rist, MD;  Location: [facility] ENDOSCOPY;  Service: Endoscopy;  Laterality: N/A;   FOOT SURGERY     patient reports four foot surgeries   FOOT SURGERY Left    bunion, hammer toe surgery   FOOT SURGERY Right    shaved bunion, hammer toe correction   HERNIA REPAIR     KNEE ARTHROSCOPY Right 06/02/1995   REPLACEMENT TOTAL KNEE Left    REPLACEMENT TOTAL KNEE Left 10/30/2012   RETINAL DETACHMENT SURGERY     RETINAL DETACHMENT SURGERY Left 06/01/2006   ROBOTIC ASSITED PARTIAL NEPHRECTOMY Left 06/03/2022   Procedure: XI ROBOTIC ASSITED PARTIAL NEPHRECTOMY WITH ULTRASOUND AND VENTRAL HERNIA REPAIR;  Surgeon: Sebastian Ache, MD;  Location: [facility];  Service: Urology;  Laterality: Left;  3 HRS   THYROIDECTOMY, PARTIAL Right    THYROIDECTOMY, PARTIAL  06/02/2003   TOTAL KNEE ARTHROPLASTY Right 11/13/2019   Procedure: TOTAL KNEE ARTHROPLASTY SDDC;  Surgeon: Ollen Gross, MD;  Location: [facility];  Service: Orthopedics;  Laterality: Right;     Social History   Tobacco Use   Smoking status: Former    Packs/day: 0.25    Years: 15.00    Additional pack years: 0.00    Total pack years: 3.75    Types: Cigarettes    Quit date: 06/02/1979    Years since quitting: 43.4   Smokeless tobacco: Never  Vaping Use   Vaping Use: Never used  Substance Use Topics   Alcohol use: Yes    Alcohol/week: 1.0 standard drink of alcohol    Types: 1 Glasses of wine per week    Comment: Occ   Drug use: No    Family History  Problem Relation Age of Onset   Heart disease Mother    Heart attack Mother    Heart disease Father    Diabetes Maternal Grandmother    Cancer Paternal Grandfather        stomach    Allergies  Allergen Reactions   Keflex [Cephalexin] Diarrhea   Percocet [Oxycodone-Acetaminophen] Other (See Comments)    Syncope (passed  out)   Clindamycin/Lincomycin Rash    Medication list has been reviewed and updated.  Current Outpatient Medications on File Prior to Visit  Medication Sig Dispense Refill   diclofenac Sodium (VOLTAREN) 1 % GEL Apply 2 g topically 4 (four) times daily as needed (pain).     hydrochlorothiazide (HYDRODIURIL) 12.5 MG tablet Take 0.5 tablets (6.25 mg total) by mouth daily as needed (elevated bp). (Patient taking differently: Take 6.25 mg by mouth daily.) 45 tablet 3   LORazepam (ATIVAN) 1 MG tablet TAKE 1 TABLET BY MOUTH TWICE A DAY AS NEEDED FOR ANXIETY **DO NOT COMBINE WITH ALCOHOL OR ANY OTHER SEDATING SUBSTANCES** 60 tablet 1   metoprolol succinate (TOPROL-XL) 25 MG 24 hr tablet TAKE 1 TABLET BY MOUTH DAILY 90 tablet 0   NONFORMULARY OR COMPOUNDED ITEM [facility]:  Peripheral Neuropathy - Bupivacaine 1%, Doxepin 3%, Gabapentin 6%, Pentoxifylline 3%, Topiramate 1%.  Apply 1-2 grams to affected area 3-4 times daily. (Patient taking differently: Apply 1 application  topically 4 (four) times daily as needed (neuropathy/pain (feet)). [facility]:  Peripheral Neuropathy - Bupivacaine 1%, Doxepin 3%, Gabapentin 6%, Pentoxifylline 3%, Topiramate 1%. Apply 1-2 grams to affected area 3-4 times daily.) 100 each 11   omeprazole (PRILOSEC) 20 MG capsule Take 1 capsule (20 mg total) by mouth daily. 90 capsule 1   pravastatin (PRAVACHOL) 20 MG tablet TAKE 1 TABLET BY MOUTH DAILY 90 tablet 1   pregabalin (LYRICA) 150 MG capsule TAKE 1 CAPSULE BY MOUTH TWICE A DAY 60 capsule 2   rivaroxaban (XARELTO) 10 MG TABS tablet Xarelto 10 mg tablet TAKE 1 TABLET BY MOUTH DAILY FOR 3 WEEKS. THEN TAKE 81 MG ASPIRIN ONCE A DAY FOR THREE WEEKS. THEN DISCONTINUE ASPIRIN     sulfamethoxazole-trimethoprim (BACTRIM DS) 800-160 MG tablet Take 1 tablet by mouth 2 (two) times daily. 10 tablet 0   No current facility-administered medications on file prior to visit.    Review of Systems:  As per HPI- otherwise  negative.   Physical Examination: There were no vitals filed for this visit. There were no vitals filed for this visit. There is no height or weight on file to calculate BMI. Ideal Body Weight:    GEN: no acute distress. HEENT: Atraumatic, Normocephalic.  Ears and Nose: No external deformity. CV: RRR, No M/G/R. No JVD. No thrill. No extra heart sounds. PULM: CTA B, no wheezes, crackles, rhonchi. No retractions. No resp. distress. No accessory muscle use. ABD: S, NT, ND, +BS. No rebound. No HSM. EXTR: No c/c/e PSYCH: Normally interactive. Conversant.    Assessment and Plan: ***  Signed Abbe Amsterdam, MD

## 2022-10-17 NOTE — Patient Instructions (Incomplete)
It was great to see you again today!  Please see me in about 6 months assuming all is well I will be in touch with your labs Try the tramadol as needed for pain- ok to continue tylenol as well Try a 1/2 tablet tramadol every 12 hours as needed- increase to a whole tablet as needed

## 2022-10-17 NOTE — Progress Notes (Signed)
Subjective: Chief Complaint  Patient presents with   Routine Post Op    POV # 4 DOS 07/29/22 --- HALLUX MPJ FUSION RIGHT   "Its feeling good"     Callouses    Trim calluses bilateral     82 year old female presents for evaluation of the right first major arthrodesis for bunion on the right foot.  She said that she has been doing well.  She has been walking with surgical shoe.  She still gets a callus on her left foot she will have trimmed today was causing pain.  She does not report any new concerns or injuries.  She states her right foot feels better than her left foot.  Objective: AAO x3, NAD DP/PT pulses palpable bilaterally, CRT less than 3 seconds Status post first MPJ arthrodesis on the right foot clinically does appear the big toe is shifted laterally but is not significantly changed.  There is edema still present there is no erythema or warmth.  There is no open lesions and there is no signs of infection.  There is no erythema or warmth.  Arthrodesis site does appear to be stable.  Callus formation left submetatarsal 3 without any underlying ulceration drainage or signs of infection.  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  There is no underlying ulceration drainage or signs of infection. No pain with calf compression, swelling, warmth, erythema  Assessment: Status post first MPJ arthrodesis  Plan: -All treatment options discussed with the patient including all alternatives, risks, complications.  -X-rays obtained reviewed.  3 views of the foot were obtained.  Hardware intact but there does remain some shifting of the hallux but appears to be stable.  Hardware intact. -Walking surgical shoe.  We discussed gradual transition to regular shoe as tolerated but she needs to do this gradually and continue to ice, elevate as well as compression to help with any residual edema.  Should there be any increasing pain return to the surgical shoe, boot.  Although she does  have residual deformity of the hallux the bunion is better.  Will see how she does as she transitions to a shoe -Sharply debrided hyperkeratotic lesion x 2 without any complications or bleeding.   Vivi Barrack DPM

## 2022-10-19 ENCOUNTER — Ambulatory Visit (INDEPENDENT_AMBULATORY_CARE_PROVIDER_SITE_OTHER): Payer: Medicare Other | Admitting: Family Medicine

## 2022-10-19 VITALS — BP 120/78 | HR 60 | Temp 98.1°F | Resp 18

## 2022-10-19 DIAGNOSIS — R5383 Other fatigue: Secondary | ICD-10-CM

## 2022-10-19 DIAGNOSIS — M545 Low back pain, unspecified: Secondary | ICD-10-CM | POA: Diagnosis not present

## 2022-10-19 DIAGNOSIS — G8929 Other chronic pain: Secondary | ICD-10-CM | POA: Diagnosis not present

## 2022-10-19 DIAGNOSIS — I10 Essential (primary) hypertension: Secondary | ICD-10-CM

## 2022-10-19 DIAGNOSIS — N1831 Chronic kidney disease, stage 3a: Secondary | ICD-10-CM

## 2022-10-19 DIAGNOSIS — E785 Hyperlipidemia, unspecified: Secondary | ICD-10-CM | POA: Diagnosis not present

## 2022-10-19 DIAGNOSIS — R7303 Prediabetes: Secondary | ICD-10-CM | POA: Diagnosis not present

## 2022-10-19 MED ORDER — TRAMADOL HCL 50 MG PO TABS
50.0000 mg | ORAL_TABLET | Freq: Two times a day (BID) | ORAL | 0 refills | Status: DC | PRN
Start: 2022-10-19 — End: 2022-12-02

## 2022-10-20 ENCOUNTER — Encounter: Payer: Self-pay | Admitting: Family Medicine

## 2022-10-20 LAB — TSH: TSH: 1.27 u[IU]/mL (ref 0.35–5.50)

## 2022-10-20 LAB — HEMOGLOBIN A1C: Hgb A1c MFr Bld: 5.7 % (ref 4.6–6.5)

## 2022-10-20 LAB — CBC
HCT: 41.8 % (ref 36.0–46.0)
Hemoglobin: 13.7 g/dL (ref 12.0–15.0)
MCHC: 32.7 g/dL (ref 30.0–36.0)
MCV: 95.8 fl (ref 78.0–100.0)
Platelets: 205 10*3/uL (ref 150.0–400.0)
RBC: 4.36 Mil/uL (ref 3.87–5.11)
RDW: 14.8 % (ref 11.5–15.5)
WBC: 5.3 10*3/uL (ref 4.0–10.5)

## 2022-10-20 LAB — COMPREHENSIVE METABOLIC PANEL
ALT: 12 U/L (ref 0–35)
AST: 21 U/L (ref 0–37)
Albumin: 4.1 g/dL (ref 3.5–5.2)
Alkaline Phosphatase: 81 U/L (ref 39–117)
BUN: 23 mg/dL (ref 6–23)
CO2: 28 mEq/L (ref 19–32)
Calcium: 9.7 mg/dL (ref 8.4–10.5)
Chloride: 104 mEq/L (ref 96–112)
Creatinine, Ser: 1.03 mg/dL (ref 0.40–1.20)
GFR: 50.75 mL/min — ABNORMAL LOW (ref >60.00)
Glucose, Bld: 101 mg/dL — ABNORMAL HIGH (ref 70–99)
Potassium: 3.9 mEq/L (ref 3.5–5.1)
Sodium: 143 mEq/L (ref 135–145)
Total Bilirubin: 0.4 mg/dL (ref 0.2–1.2)
Total Protein: 6.7 g/dL (ref 6.0–8.3)

## 2022-10-20 LAB — LIPID PANEL
Cholesterol: 172 mg/dL (ref 0–200)
HDL: 67.5 mg/dL (ref >39.00)
LDL Cholesterol: 67 mg/dL (ref 0–99)
NonHDL: 104.58
Total CHOL/HDL Ratio: 3
Triglycerides: 190 mg/dL — ABNORMAL HIGH (ref 0.0–149.0)
VLDL: 38 mg/dL (ref 0.0–40.0)

## 2022-10-21 ENCOUNTER — Ambulatory Visit (INDEPENDENT_AMBULATORY_CARE_PROVIDER_SITE_OTHER): Payer: Medicare Other | Admitting: Obstetrics & Gynecology

## 2022-10-21 ENCOUNTER — Encounter: Payer: Self-pay | Admitting: Obstetrics & Gynecology

## 2022-10-21 VITALS — BP 123/62 | HR 53 | Wt 168.0 lb

## 2022-10-21 DIAGNOSIS — R399 Unspecified symptoms and signs involving the genitourinary system: Secondary | ICD-10-CM | POA: Diagnosis not present

## 2022-10-21 LAB — POCT URINALYSIS DIPSTICK
Bilirubin, UA: NEGATIVE
Blood, UA: NEGATIVE
Glucose, UA: NEGATIVE
Ketones, UA: NEGATIVE
Nitrite, UA: NEGATIVE
Protein, UA: NEGATIVE
Spec Grav, UA: 1.01 (ref 1.010–1.025)
Urobilinogen, UA: 0.2 E.U./dL
pH, UA: 6 (ref 5.0–8.0)

## 2022-10-22 ENCOUNTER — Other Ambulatory Visit: Payer: Self-pay | Admitting: Podiatry

## 2022-10-23 ENCOUNTER — Other Ambulatory Visit: Payer: Self-pay | Admitting: Family Medicine

## 2022-10-23 LAB — URINE CULTURE

## 2022-10-26 ENCOUNTER — Encounter: Payer: Self-pay | Admitting: Obstetrics & Gynecology

## 2022-10-26 NOTE — Progress Notes (Signed)
HPI Pt presents today to have pessary removed and cleaned.  She reports that she is not having a good day today. She says that the is feeling very anxious.  She denies problems with the pessary and reports that it is  working very well.  She reports that since she was last here she's had surgery to her food, a bunion.  She denies further complaints.      Review of Systems       Objective:  BP 123/62   Pulse (!) 53   Wt 168 lb (76.2 kg)   BMI 31.74 kg/m   CONSTITUTIONAL: Well-developed, well-nourished female in no acute distress.  HENT:  Normocephalic, atraumatic EYES: Conjunctivae and EOM are normal. No scleral icterus.  NECK: Normal range of motion SKIN: Skin is warm and dry. No rash noted. Not diaphoretic.No pallor. NEUROLGIC: Alert and oriented to person, place, and time. Normal coordination.  GYN: Normal appearing external genitalia; normal appearing vaginal mucosa and cervix. Normal discharge.  Pessary removed and cleaned; ext gen no issues noted. No excoriations or breakdown of skin.    Assessment:  Pessary check  Pelvic organ prolapse Urinary incontinence- improved since pessary.  f/u in 3 months to have pessary removed and cleaned Needs annual in 1 year f/u sooner prn    Philisha Weinel L. Harraway-Smith, M.D., [facility]

## 2022-11-02 ENCOUNTER — Other Ambulatory Visit: Payer: Self-pay | Admitting: Family Medicine

## 2022-11-02 ENCOUNTER — Ambulatory Visit (INDEPENDENT_AMBULATORY_CARE_PROVIDER_SITE_OTHER): Payer: Medicare Other | Admitting: Podiatry

## 2022-11-02 DIAGNOSIS — K219 Gastro-esophageal reflux disease without esophagitis: Secondary | ICD-10-CM

## 2022-11-02 DIAGNOSIS — M2061 Acquired deformities of toe(s), unspecified, right foot: Secondary | ICD-10-CM

## 2022-11-02 DIAGNOSIS — Z9889 Other specified postprocedural states: Secondary | ICD-10-CM

## 2022-11-02 DIAGNOSIS — M21611 Bunion of right foot: Secondary | ICD-10-CM

## 2022-11-02 DIAGNOSIS — F411 Generalized anxiety disorder: Secondary | ICD-10-CM

## 2022-11-03 ENCOUNTER — Telehealth: Payer: Self-pay | Admitting: Family Medicine

## 2022-11-03 DIAGNOSIS — H35372 Puckering of macula, left eye: Secondary | ICD-10-CM | POA: Diagnosis not present

## 2022-11-03 DIAGNOSIS — H40003 Preglaucoma, unspecified, bilateral: Secondary | ICD-10-CM | POA: Diagnosis not present

## 2022-11-03 DIAGNOSIS — H52203 Unspecified astigmatism, bilateral: Secondary | ICD-10-CM | POA: Diagnosis not present

## 2022-11-03 DIAGNOSIS — H43813 Vitreous degeneration, bilateral: Secondary | ICD-10-CM | POA: Diagnosis not present

## 2022-11-03 DIAGNOSIS — H524 Presbyopia: Secondary | ICD-10-CM | POA: Diagnosis not present

## 2022-11-03 MED ORDER — SULFAMETHOXAZOLE-TRIMETHOPRIM 800-160 MG PO TABS: 1.0000 | ORAL_TABLET | Freq: Two times a day (BID) | ORAL | 0 refills | Status: DC

## 2022-11-03 NOTE — Telephone Encounter (Signed)
Please advise 

## 2022-11-03 NOTE — Telephone Encounter (Signed)
Called her back  She is concerned about a UTI She notes pain in her back, no other urinary sx such as dysuria or urgency-symptoms for 2 or 3 days Otherwise feeling well Advised that this may not be a urinary tract infection, we tried to find a way she could come in to see [name] or have a urine sample brought in.  Unfortunately, Margaret [name] notes her car is currently in the shop and she has no one who can drive her.  She would like me to send an antibiotic, she does agree to let me know if this is not working in which case we will have to get her seen.  Meds ordered this encounter  Medications   sulfamethoxazole-trimethoprim (BACTRIM DS) 800-160 MG tablet    Sig: Take 1 tablet by mouth 2 (two) times daily.    Dispense:  10 tablet    Refill:  0

## 2022-11-04 NOTE — Progress Notes (Signed)
Subjective: Chief Complaint  Patient presents with   Callouses    82 year old female presents for evaluation of the right first major arthrodesis for bunion on the right foot.  She states the right foot feels prepped and she will use the calluses times days are causing discomfort when the left foot.  No recent injury or changes.    Objective: AAO x3, NAD- wearing regular sandal DP/PT pulses palpable bilaterally, CRT less than 3 seconds Status post first MPJ arthrodesis on the right foot.  Arthrodesis site is stable.  No tenderness palpation of the bunion.  Still some residual edema present but edema present bilaterally.  No erythema or warmth.  Residual hallux abductus is present, but not causing patient. Hyperkeratotic lesion submetatarsal left foot as well as right fifth metatarsal base without any underlying ulceration drainage or signs of infection. No pain with calf compression, swelling, warmth, erythema  Assessment: Status post first MPJ arthrodesis; hyperkeratotic lesions  Plan: -All treatment options discussed with the patient including all alternatives, risks, complications.  -From bleeding standpoint she is doing great.  She is back to her regular shoes.  Discussed compression, elevation to help with any residual edema.  Gradual increase activity as tolerated -As a courtesy debride the calluses x 2 with any complications or bleeding.  Moisturizer, offloading.  Vivi Barrack DPM

## 2022-11-16 ENCOUNTER — Telehealth: Payer: Self-pay

## 2022-11-16 NOTE — Telephone Encounter (Signed)
Called patient to schedule her follow up visit with Dr. Erin Fulling. Left our contact information for her to call back.

## 2022-11-19 ENCOUNTER — Telehealth: Payer: Self-pay | Admitting: Family Medicine

## 2022-11-19 NOTE — Telephone Encounter (Signed)
Copied from CRM [id number]. Topic: Medicare AWV >> Nov 19, 2022  1:32 PM Payton Doughty wrote: Reason for CRM: LM 11/19/2022 to schedule AWV   Verlee Rossetti; Care Guide Ambulatory Clinical Support [facility] Direct Dial: [phone]

## 2022-11-23 ENCOUNTER — Ambulatory Visit (INDEPENDENT_AMBULATORY_CARE_PROVIDER_SITE_OTHER): Payer: Medicare Other

## 2022-11-23 ENCOUNTER — Ambulatory Visit (INDEPENDENT_AMBULATORY_CARE_PROVIDER_SITE_OTHER): Payer: Medicare Other | Admitting: Podiatry

## 2022-11-23 DIAGNOSIS — M2061 Acquired deformities of toe(s), unspecified, right foot: Secondary | ICD-10-CM | POA: Diagnosis not present

## 2022-11-23 DIAGNOSIS — Z9889 Other specified postprocedural states: Secondary | ICD-10-CM | POA: Diagnosis not present

## 2022-11-23 DIAGNOSIS — Q828 Other specified congenital malformations of skin: Secondary | ICD-10-CM | POA: Diagnosis not present

## 2022-11-23 DIAGNOSIS — G629 Polyneuropathy, unspecified: Secondary | ICD-10-CM

## 2022-11-25 NOTE — Progress Notes (Signed)
Subjective: Chief Complaint  Patient presents with   Routine Post Op    POV # 6 DOS 07/29/22 --- HALLUX MPJ FUSION RIGHT Pt states she has no pain     82 year old female presents for evaluation of the right first major arthrodesis for bunion on the right foot.  States that she is doing well and not in any pain at surgical site she is back to her regular shoe.  Main concern is the calluses on the left foot.  No open lesions.  No injuries.  No drainage.  Objective: AAO x3, NAD- wearing regular sandal DP/PT pulses palpable bilaterally, CRT less than 3 seconds Status post first MPJ arthrodesis on the right foot.  Arthrodesis site is stable.  No tenderness palpation of the bunion.  Still some mild residual edema present but appears to be improving.  No erythema or warmth.  Residual hallux abductus is present, but not causing patient. Hyperkeratotic lesion submetatarsal left foot x 2 without any underlying ulceration drainage or signs of infection. No pain with calf compression, swelling, warmth, erythema  Assessment: Status post first MPJ arthrodesis; hyperkeratotic lesions  Plan: -All treatment options discussed with the patient including all alternatives, risks, complications.  -X-rays obtained reviewed of the left foot.  3 views of the right foot were obtained.  No evidence of acute fracture.  Status post attempted fusion of the first MTPJ.  There is noted compared to initially preop x-rays. -Clinically she is happy the right foot on anything.  Will have to encounter revision for now but will monitor.  Discussed supportive shoe gear.  Compression, elevation as well as ice over the residual edema but this appears to be improving. -As a courtesy debride the calluses x 2 with any complications or bleeding.  Moisturizer, offloading. -Continue Lyrica for neuropathy   Vivi Barrack DPM

## 2022-12-02 ENCOUNTER — Ambulatory Visit (INDEPENDENT_AMBULATORY_CARE_PROVIDER_SITE_OTHER): Payer: Medicare Other | Admitting: *Deleted

## 2022-12-02 DIAGNOSIS — Z0000 Encounter for general adult medical examination without abnormal findings: Secondary | ICD-10-CM | POA: Diagnosis not present

## 2022-12-02 NOTE — Progress Notes (Signed)
Subjective:   Margaret [name] is a 82 y.o. female who presents for Medicare Annual (Subsequent) preventive examination.  Visit Complete: Virtual  I connected with  Margaret [name] on 12/02/22 by a audio enabled telemedicine application and verified that I am speaking with the correct person using two identifiers.  Patient Location: Home  Provider Location: Office/Clinic  I discussed the limitations of evaluation and management by telemedicine. The patient expressed understanding and agreed to proceed.   Review of Systems     Cardiac Risk Factors include: advanced age (>75men, >32 women);dyslipidemia;hypertension     Objective:    Today's Vitals   There is no height or weight on file to calculate BMI.     12/02/2022    1:07 PM 06/03/2022    6:59 AM 05/20/2022    9:16 AM 08/05/2021   11:12 AM 01/26/2020    1:08 PM 11/13/2019    5:53 AM 11/03/2019   10:18 AM  Advanced Directives  Does Patient Have a Medical Advance Directive? No Yes Yes Yes Yes No No  Type of Special educational needs teacher of [place];Living will Healthcare Power of [place];Living will Healthcare Power of [place];Living will Healthcare Power of [place];Living will;Out of facility DNR (pink MOST or yellow form)    Does patient want to make changes to medical advance directive?  No - Patient declined       Copy of Healthcare Power of Attorney in Chart?  No - copy requested Yes - validated most recent copy scanned in chart (See row information) No - copy requested     Would patient like information on creating a medical advance directive? No - Patient declined     No - Patient declined     Current Medications (verified) Outpatient Encounter Medications as of 12/02/2022  Medication Sig   diclofenac Sodium (VOLTAREN) 1 % GEL Apply 2 g topically 4 (four) times daily as needed (pain).   hydrochlorothiazide (HYDRODIURIL) 12.5 MG tablet Take 0.5 tablets (6.25 mg total) by mouth daily as needed (elevated bp).    LORazepam (ATIVAN) 1 MG tablet TAKE 1 TABLET BY MOUTH TWICE A DAY AS NEEDED FOR ANXIETY **DO NOT COMBINE WITH ALCOHOL OR OTHER SEDATING SUBSTANCE**   metoprolol succinate (TOPROL-XL) 25 MG 24 hr tablet Take 1 tablet (25 mg total) by mouth daily.   NONFORMULARY OR COMPOUNDED ITEM [facility]:  Peripheral Neuropathy - Bupivacaine 1%, Doxepin 3%, Gabapentin 6%, Pentoxifylline 3%, Topiramate 1%. Apply 1-2 grams to affected area 3-4 times daily. (Patient taking differently: Apply 1 application  topically 4 (four) times daily as needed (neuropathy/pain (feet)). [facility]:  Peripheral Neuropathy - Bupivacaine 1%, Doxepin 3%, Gabapentin 6%, Pentoxifylline 3%, Topiramate 1%. Apply 1-2 grams to affected area 3-4 times daily.)   omeprazole (PRILOSEC) 20 MG capsule TAKE 1 CAPSULE BY MOUTH DAILY   pravastatin (PRAVACHOL) 20 MG tablet TAKE 1 TABLET BY MOUTH DAILY   pregabalin (LYRICA) 150 MG capsule TAKE 1 CAPSULE BY MOUTH TWICE A DAY   [DISCONTINUED] sulfamethoxazole-trimethoprim (BACTRIM DS) 800-160 MG tablet Take 1 tablet by mouth 2 (two) times daily.   [DISCONTINUED] traMADol (ULTRAM) 50 MG tablet Take 1 tablet (50 mg total) by mouth every 12 (twelve) hours as needed.   No facility-administered encounter medications on file as of 12/02/2022.    Allergies (verified) Keflex [cephalexin], Percocet [oxycodone-acetaminophen], and Clindamycin/lincomycin   History: Past Medical History:  Diagnosis Date   Anxiety    Arthritis    Cancer (HCC)    lt. foot  melanoma   Chronic kidney disease    lt. kidney cyst   Coronary artery disease    GERD (gastroesophageal reflux disease)    History of healed stress fracture 2013   bilateral feet   Hx of phlebitis    reports remote hx of "superfical blood clots" never anticoagulated   Hypertension    Osteopenia    Pneumonia    Past Surgical History:  Procedure Laterality Date   CATARACT EXTRACTION W/ INTRAOCULAR LENS IMPLANT Bilateral     CHOLECYSTECTOMY N/A 09/19/2016   Procedure: LAPAROSCOPIC CHOLECYSTECTOMY POSSIBLE INTRAOPERATIVE CHOLANGIOGRAM;  Surgeon: Emelia Loron, MD;  Location: [facility] OR;  Service: General;  Laterality: N/A;   ERCP N/A 09/18/2016   Procedure: ENDOSCOPIC RETROGRADE CHOLANGIOPANCREATOGRAPHY (ERCP);  Surgeon: Sherrilyn Rist, MD;  Location: [facility] ENDOSCOPY;  Service: Endoscopy;  Laterality: N/A;   FOOT SURGERY     patient reports four foot surgeries   FOOT SURGERY Left    bunion, hammer toe surgery   FOOT SURGERY Right    shaved bunion, hammer toe correction   HERNIA REPAIR     KNEE ARTHROSCOPY Right 06/02/1995   REPLACEMENT TOTAL KNEE Left    REPLACEMENT TOTAL KNEE Left 10/30/2012   RETINAL DETACHMENT SURGERY     RETINAL DETACHMENT SURGERY Left 06/01/2006   ROBOTIC ASSITED PARTIAL NEPHRECTOMY Left 06/03/2022   Procedure: XI ROBOTIC ASSITED PARTIAL NEPHRECTOMY WITH ULTRASOUND AND VENTRAL HERNIA REPAIR;  Surgeon: Sebastian Ache, MD;  Location: [facility] ORS;  Service: Urology;  Laterality: Left;  3 HRS   THYROIDECTOMY, PARTIAL Right    THYROIDECTOMY, PARTIAL  06/02/2003   TOTAL KNEE ARTHROPLASTY Right 11/13/2019   Procedure: TOTAL KNEE ARTHROPLASTY SDDC;  Surgeon: Ollen Gross, MD;  Location: [facility] ORS;  Service: Orthopedics;  Laterality: Right;    Family History  Problem Relation Age of Onset   Heart disease Mother    Heart attack Mother    Heart disease Father    Diabetes Maternal Grandmother    Cancer Paternal Grandfather        stomach   Social History   Socioeconomic History   Marital status: Widowed    Spouse name: Not on file   Number of children: 3   Years of education: Not on file   Highest education level: 12th grade  Occupational History   Not on file  Tobacco Use   Smoking status: Former    Packs/day: 0.25    Years: 15.00    Additional pack years: 0.00    Total pack years: 3.75    Types: Cigarettes    Quit date: 06/02/1979    Years since quitting: 43.5   Smokeless  tobacco: Never  Vaping Use   Vaping Use: Never used  Substance and Sexual Activity   Alcohol use: Yes    Alcohol/week: 1.0 standard drink of alcohol    Types: 1 Glasses of wine per week    Comment: Occ   Drug use: No   Sexual activity: Not Currently  Other Topics Concern   Not on file  Social History Narrative   ** Merged History Encounter **       Married   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   3 children-[name] Dewaine Conger, son here, on daughter in [place]   7 grandchildren   1 great grandson    Left Handed   Social Determinants of Health   Financial Resource Strain: Low Risk  (10/18/2022)   Overall Financial Resource Strain (CARDIA)  Difficulty of Paying Living Expenses: Not very hard  Food Insecurity: No Food Insecurity (10/18/2022)   Hunger Vital Sign    Worried About Running Out of Food in the Last Year: Never true    Ran Out of Food in the Last Year: Never true  Transportation Needs: Unmet Transportation Needs (10/18/2022)   PRAPARE - Administrator, Civil Service (Medical): Yes    Lack of Transportation (Non-Medical): No  Physical Activity: Unknown (10/18/2022)   Exercise Vital Sign    Days of Exercise per Week: 0 days    Minutes of Exercise per Session: Not on file  Stress: No Stress Concern Present (10/18/2022)   [employer] of Occupational Health - Occupational Stress Questionnaire    Feeling of Stress : Only a little  Social Connections: Moderately Integrated (10/18/2022)   Social Connection and Isolation Panel [NHANES]    Frequency of Communication with Friends and Family: More than three times a week    Frequency of Social Gatherings with Friends and Family: Three times a week    Attends Religious Services: More than 4 times per year    Active Member of Clubs or Organizations: Yes    Attends Banker Meetings: More than 4 times per year    Marital Status: Widowed    Tobacco Counseling Counseling given: Not  Answered   Clinical Intake:  Pre-visit preparation completed: Yes  Pain : No/denies pain     Nutritional Risks: None Diabetes: No CBG done?: No Did pt. bring in CBG monitor from home?: No  How often do you need to have someone help you when you read instructions, pamphlets, or other written materials from your doctor or pharmacy?: 1 - Never  Interpreter Needed?: No  Information entered by :: Donne Anon, CMA   Activities of Daily Living    12/02/2022    1:04 PM 06/03/2022    6:53 PM  In your present state of health, do you have any difficulty performing the following activities:  Hearing? 1   Comment wears hearing aids   Vision? 0   Difficulty concentrating or making decisions? 1   Comment remembering things   Walking or climbing stairs? 0   Dressing or bathing? 0   Doing errands, shopping? 0 0  Preparing Food and eating ? N   Using the Toilet? N   In the past six months, have you accidently leaked urine? Y   Do you have problems with loss of bowel control? N   Managing your Medications? N   Managing your Finances? N   Housekeeping or managing your Housekeeping? N     Patient Care Team: Copland, Gwenlyn Found, MD as PCP - General (Family Medicine) Jens Som Madolyn Frieze, MD as PCP - Cardiology (Cardiology) Van Clines, MD as Consulting Physician (Neurology)  Indicate any recent Medical Services you may have received from other than [facility] providers in the past year (date may be approximate).     Assessment:   This is a routine wellness examination for [place].  Hearing/Vision screen No results found.  Dietary issues and exercise activities discussed:     Goals Addressed   None    Depression Screen    12/02/2022    1:09 PM 10/19/2022    1:11 PM 08/05/2021   11:04 AM 04/07/2021    1:32 PM 04/03/2020   11:13 AM 01/27/2017   11:14 AM 03/17/2016    1:53 PM  PHQ 2/9 Scores  PHQ - 2 Score 0 0  0 0 0 0 0  Exception Documentation      Patient refusal     Fall  Risk    12/02/2022    1:06 PM 10/19/2022    1:11 PM 08/05/2021   11:03 AM 04/07/2021    1:32 PM 01/26/2020    1:07 PM  Fall Risk   Falls in the past year? 0 0 0 0 1  Number falls in past yr: 0 0 0 0 1  Injury with Fall? 0 0 0 0 0  Risk for fall due to : No Fall Risks No Fall Risks No Fall Risks    Follow up Falls evaluation completed Falls evaluation completed Falls evaluation completed      MEDICARE RISK AT HOME:  Medicare Risk at Home - 12/02/22 1305     Any stairs in or around the home? Yes    If so, are there any without handrails? No    Home free of loose throw rugs in walkways, pet beds, electrical cords, etc? Yes    Adequate lighting in your home to reduce risk of falls? Yes    Life alert? No    Use of a cane, walker or w/c? Yes    Grab bars in the bathroom? Yes    Shower chair or bench in shower? Yes    Elevated toilet seat or a handicapped toilet? Yes             TIMED UP AND GO:  Was the test performed?  No    Cognitive Function:        12/02/2022    1:10 PM 08/05/2021   11:07 AM  6CIT Screen  What Year? 0 points 0 points  What month? 0 points 0 points  What time? 0 points 0 points  Count back from 20 0 points 0 points  Months in reverse 0 points 0 points  Repeat phrase 0 points 0 points  Total Score 0 points 0 points    Immunizations Immunization History  Administered Date(s) Administered   Fluad Quad(high Dose 65+) 04/07/2021, 03/15/2022   H1N1 02/25/2010   Influenza Split 06/08/2008, 05/11/2011   Influenza, High Dose Seasonal PF 03/06/2015, 03/17/2016, 01/27/2017, 02/16/2018, 02/17/2019   Influenza, Seasonal, Injecte, Preservative Fre 03/03/2012, 04/13/2013   Influenza-Unspecified 03/01/2014, 03/01/2020   Moderna Covid-19 Vaccine Bivalent Booster 14yrs & up 04/21/2021   Moderna Sars-Covid-2 Vaccination 06/28/2019, 07/12/2019, 06/27/2020   Pneumococcal Conjugate-13 02/09/2014   Pneumococcal Polysaccharide-23 03/03/2012   Td 10/31/2007, 01/17/2020    Zoster Recombinant(Shingrix) 05/17/2018, 07/26/2018   Zoster, Live 03/23/2013    TDAP status: Up to date  Flu Vaccine status: Up to date  Pneumococcal vaccine status: Up to date  Covid-19 vaccine status: Information provided on how to obtain vaccines.   Qualifies for Shingles Vaccine? Yes   Zostavax completed Yes   Shingrix Completed?: Yes  Screening Tests Health Maintenance  Topic Date Due   COVID-19 Vaccine (5 - 2023-24 season) 01/30/2022   Medicare Annual Wellness (AWV)  08/06/2022   INFLUENZA VACCINE  12/31/2022   MAMMOGRAM  10/04/2024   DTaP/Tdap/Td (3 - Tdap) 01/16/2030   Pneumonia Vaccine 37+ Years old  Completed   DEXA SCAN  Completed   Zoster Vaccines- Shingrix  Completed   HPV VACCINES  Aged Out    Health Maintenance  Health Maintenance Due  Topic Date Due   COVID-19 Vaccine (5 - 2023-24 season) 01/30/2022   Medicare Annual Wellness (AWV)  08/06/2022    Colorectal cancer screening: No longer required.  Mammogram status: Completed 10/05/22. Repeat every year  Bone Density status: Completed 10/24/20. Results reflect: Bone density results: OSTEOPOROSIS. Repeat every 2 years.  Lung Cancer Screening: (Low Dose CT Chest recommended if Age 19-80 years, 20 pack-year currently smoking OR have quit w/in 15years.) does not qualify.   Additional Screening:  Hepatitis C Screening: does not qualify  Vision Screening: Recommended annual ophthalmology exams for early detection of glaucoma and other disorders of the eye. Is the patient up to date with their annual eye exam?  Yes  Who is the provider or what is the name of the office in which the patient attends annual eye exams? Dr. Sondra Barges If pt is not established with a provider, would they like to be referred to a provider to establish care? No .   Dental Screening: Recommended annual dental exams for proper oral hygiene  Diabetic Foot Exam: N/a  Community Resource Referral / Chronic Care Management: CRR  required this visit?  No   CCM required this visit?  No     Plan:     I have personally reviewed and noted the following in the patient's chart:   Medical and social history Use of alcohol, tobacco or illicit drugs  Current medications and supplements including opioid prescriptions. Patient is not currently taking opioid prescriptions. Functional ability and status Nutritional status Physical activity Advanced directives List of other physicians Hospitalizations, surgeries, and ER visits in previous 12 months Vitals Screenings to include cognitive, depression, and falls Referrals and appointments  In addition, I have reviewed and discussed with patient certain preventive protocols, quality metrics, and best practice recommendations. A written personalized care plan for preventive services as well as general preventive health recommendations were provided to patient.     Donne Anon, CMA   12/02/2022   After Visit Summary: (MyChart) Due to this being a telephonic visit, the after visit summary with patients personalized plan was offered to patient via MyChart   Nurse Notes: None

## 2022-12-02 NOTE — Patient Instructions (Signed)
Margaret [name] , Thank you for taking time to come for your Medicare Wellness Visit. I appreciate your ongoing commitment to your health goals. Please review the following plan we discussed and let me know if I can assist you in the future.   These are the goals we discussed:  Goals   None     This is a list of the screening recommended for you and due dates:  Health Maintenance  Topic Date Due   COVID-19 Vaccine (5 - 2023-24 season) 01/30/2022   Flu Shot  12/31/2022   Medicare Annual Wellness Visit  12/02/2023   Mammogram  10/04/2024   DTaP/Tdap/Td vaccine (3 - Tdap) 01/16/2030   Pneumonia Vaccine  Completed   DEXA scan (bone density measurement)  Completed   Zoster (Shingles) Vaccine  Completed   HPV Vaccine  Aged Out     Next appointment: Follow up in one year for your annual wellness visit.   Preventive Care 82 Years and Older, Female Preventive care refers to lifestyle choices and visits with your health care provider that can promote health and wellness. What does preventive care include? A yearly physical exam. This is also called an annual well check. Dental exams once or twice a year. Routine eye exams. Ask your health care provider how often you should have your eyes checked. Personal lifestyle choices, including: Daily care of your teeth and gums. Regular physical activity. Eating a healthy diet. Avoiding tobacco and drug use. Limiting alcohol use. Practicing safe sex. Taking low-dose aspirin every day. Taking vitamin and mineral supplements as recommended by your health care provider. What happens during an annual well check? The services and screenings done by your health care provider during your annual well check will depend on your age, overall health, lifestyle risk factors, and family history of disease. Counseling  Your health care provider may ask you questions about your: Alcohol use. Tobacco use. Drug use. Emotional well-being. Home and  relationship well-being. Sexual activity. Eating habits. History of falls. Memory and ability to understand (cognition). Work and work Astronomer. Reproductive health. Screening  You may have the following tests or measurements: Height, weight, and BMI. Blood pressure. Lipid and cholesterol levels. These may be checked every 5 years, or more frequently if you are over 75 years old. Skin check. Lung cancer screening. You may have this screening every year starting at age 82 if you have a 30-pack-year history of smoking and currently smoke or have quit within the past 15 years. Fecal occult blood test (FOBT) of the stool. You may have this test every year starting at age 82. Flexible sigmoidoscopy or colonoscopy. You may have a sigmoidoscopy every 5 years or a colonoscopy every 10 years starting at age 82. Hepatitis C blood test. Hepatitis B blood test. Sexually transmitted disease (STD) testing. Diabetes screening. This is done by checking your blood sugar (glucose) after you have not eaten for a while (fasting). You may have this done every 1-3 years. Bone density scan. This is done to screen for osteoporosis. You may have this done starting at age 82. Mammogram. This may be done every 1-2 years. Talk to your health care provider about how often you should have regular mammograms. Talk with your health care provider about your test results, treatment options, and if necessary, the need for more tests. Vaccines  Your health care provider may recommend certain vaccines, such as: Influenza vaccine. This is recommended every year. Tetanus, diphtheria, and acellular pertussis (Tdap, Td) vaccine. You may  need a Td booster every 10 years. Zoster vaccine. You may need this after age 82. Pneumococcal 13-valent conjugate (PCV13) vaccine. One dose is recommended after age 82. Pneumococcal polysaccharide (PPSV23) vaccine. One dose is recommended after age 82. Talk to your health care provider  about which screenings and vaccines you need and how often you need them. This information is not intended to replace advice given to you by your health care provider. Make sure you discuss any questions you have with your health care provider. Document Released: 06/14/2015 Document Revised: 02/05/2016 Document Reviewed: 03/19/2015 Elsevier Interactive Patient Education  2017 [place] Prevention in the Home Falls can cause injuries. They can happen to people of all ages. There are many things you can do to make your home safe and to help prevent falls. What can I do on the outside of my home? Regularly fix the edges of walkways and driveways and fix any cracks. Remove anything that might make you trip as you walk through a door, such as a raised step or threshold. Trim any bushes or trees on the path to your home. Use bright outdoor lighting. Clear any walking paths of anything that might make someone trip, such as rocks or tools. Regularly check to see if handrails are loose or broken. Make sure that both sides of any steps have handrails. Any raised decks and porches should have guardrails on the edges. Have any leaves, snow, or ice cleared regularly. Use sand or salt on walking paths during winter. Clean up any spills in your garage right away. This includes oil or grease spills. What can I do in the bathroom? Use night lights. Install grab bars by the toilet and in the tub and shower. Do not use towel bars as grab bars. Use non-skid mats or decals in the tub or shower. If you need to sit down in the shower, use a plastic, non-slip stool. Keep the floor dry. Clean up any water that spills on the floor as soon as it happens. Remove soap buildup in the tub or shower regularly. Attach bath mats securely with double-sided non-slip rug tape. Do not have throw rugs and other things on the floor that can make you trip. What can I do in the bedroom? Use night lights. Make sure  that you have a light by your bed that is easy to reach. Do not use any sheets or blankets that are too big for your bed. They should not hang down onto the floor. Have a firm chair that has side arms. You can use this for support while you get dressed. Do not have throw rugs and other things on the floor that can make you trip. What can I do in the kitchen? Clean up any spills right away. Avoid walking on wet floors. Keep items that you use a lot in easy-to-reach places. If you need to reach something above you, use a strong step stool that has a grab bar. Keep electrical cords out of the way. Do not use floor polish or wax that makes floors slippery. If you must use wax, use non-skid floor wax. Do not have throw rugs and other things on the floor that can make you trip. What can I do with my stairs? Do not leave any items on the stairs. Make sure that there are handrails on both sides of the stairs and use them. Fix handrails that are broken or loose. Make sure that handrails are as long as the stairways.  Check any carpeting to make sure that it is firmly attached to the stairs. Fix any carpet that is loose or worn. Avoid having throw rugs at the top or bottom of the stairs. If you do have throw rugs, attach them to the floor with carpet tape. Make sure that you have a light switch at the top of the stairs and the bottom of the stairs. If you do not have them, ask someone to add them for you. What else can I do to help prevent falls? Wear shoes that: Do not have high heels. Have rubber bottoms. Are comfortable and fit you well. Are closed at the toe. Do not wear sandals. If you use a stepladder: Make sure that it is fully opened. Do not climb a closed stepladder. Make sure that both sides of the stepladder are locked into place. Ask someone to hold it for you, if possible. Clearly mark and make sure that you can see: Any grab bars or handrails. First and last steps. Where the edge of  each step is. Use tools that help you move around (mobility aids) if they are needed. These include: Canes. Walkers. Scooters. Crutches. Turn on the lights when you go into a dark area. Replace any light bulbs as soon as they burn out. Set up your furniture so you have a clear path. Avoid moving your furniture around. If any of your floors are uneven, fix them. If there are any pets around you, be aware of where they are. Review your medicines with your doctor. Some medicines can make you feel dizzy. This can increase your chance of falling. Ask your doctor what other things that you can do to help prevent falls. This information is not intended to replace advice given to you by your health care provider. Make sure you discuss any questions you have with your health care provider. Document Released: 03/14/2009 Document Revised: 10/24/2015 Document Reviewed: 06/22/2014 Elsevier Interactive Patient Education  2017 [employer].

## 2022-12-14 ENCOUNTER — Other Ambulatory Visit (HOSPITAL_COMMUNITY): Payer: Self-pay | Admitting: Urology

## 2022-12-14 ENCOUNTER — Ambulatory Visit (INDEPENDENT_AMBULATORY_CARE_PROVIDER_SITE_OTHER): Payer: Medicare Other | Admitting: Podiatry

## 2022-12-14 ENCOUNTER — Ambulatory Visit (HOSPITAL_COMMUNITY)
Admission: RE | Admit: 2022-12-14 | Discharge: 2022-12-14 | Disposition: A | Discharge status: Home / Self Care | Payer: Medicare Other | Source: Ambulatory Visit | Attending: Urology | Admitting: Urology

## 2022-12-14 DIAGNOSIS — C642 Malignant neoplasm of left kidney, except renal pelvis: Secondary | ICD-10-CM

## 2022-12-14 DIAGNOSIS — Q828 Other specified congenital malformations of skin: Secondary | ICD-10-CM

## 2022-12-14 DIAGNOSIS — M2061 Acquired deformities of toe(s), unspecified, right foot: Secondary | ICD-10-CM | POA: Diagnosis not present

## 2022-12-14 DIAGNOSIS — G629 Polyneuropathy, unspecified: Secondary | ICD-10-CM | POA: Diagnosis not present

## 2022-12-14 DIAGNOSIS — Z85528 Personal history of other malignant neoplasm of kidney: Secondary | ICD-10-CM | POA: Diagnosis not present

## 2022-12-14 NOTE — Progress Notes (Signed)
Subjective: Chief Complaint  Patient presents with   Callouses    Patient came in today for routine foot care, callus trim     82 year old female presents for evaluation of painful calluses on the left foot.  The lesion has not come back on the right foot since her surgery.  Having some discomfort the bunion but she is back to wearing regular shoes.  No open lesions.  Objective: AAO x3, NAD- wearing regular sandal DP/PT pulses palpable bilaterally, CRT less than 3 seconds Status post first MPJ arthrodesis on the right foot.  Arthrodesis site is stable.  Recurrent abduction of this of the hallux.  Arthrodesis status stable.  No significant pain on exam. Hyperkeratotic lesion submetatarsal left foot x 2 without any underlying ulceration drainage or signs of infection. No pain with calf compression, swelling, warmth, erythema  Assessment: Status post first MPJ arthrodesis; hyperkeratotic lesions  Plan: -All treatment options discussed with the patient including all alternatives, risks, complications.  -She is not interested in surgical intervention.  Continue offloading as well as shoes avoid any excess pressure. -As a courtesy debride the calluses x 2 with any complications or bleeding.  Moisturizer, offloading. -Continue Lyrica for neuropathy   Vivi Barrack DPM

## 2022-12-16 DIAGNOSIS — C642 Malignant neoplasm of left kidney, except renal pelvis: Secondary | ICD-10-CM | POA: Diagnosis not present

## 2022-12-21 DIAGNOSIS — R300 Dysuria: Secondary | ICD-10-CM | POA: Diagnosis not present

## 2022-12-21 DIAGNOSIS — K439 Ventral hernia without obstruction or gangrene: Secondary | ICD-10-CM | POA: Diagnosis not present

## 2022-12-21 DIAGNOSIS — C642 Malignant neoplasm of left kidney, except renal pelvis: Secondary | ICD-10-CM | POA: Diagnosis not present

## 2022-12-23 ENCOUNTER — Other Ambulatory Visit: Payer: Self-pay | Admitting: Podiatry

## 2022-12-24 ENCOUNTER — Telehealth: Payer: Self-pay | Admitting: Podiatry

## 2022-12-24 ENCOUNTER — Other Ambulatory Visit: Payer: Self-pay | Admitting: Podiatry

## 2022-12-24 MED ORDER — PREGABALIN 150 MG PO CAPS: 150.0000 mg | ORAL_CAPSULE | Freq: Two times a day (BID) | ORAL | 1 refills | Status: DC

## 2022-12-24 NOTE — Telephone Encounter (Signed)
Pt called and stated that her pharmacy has sent you a message to get her Lyrica refilled. Could you please send it in. Pharmacy is correct in chart.

## 2022-12-24 NOTE — Telephone Encounter (Signed)
Pt.notified

## 2022-12-25 NOTE — Progress Notes (Signed)
HPI: FU syncope. Previously seen by neurology and felt to have a seizure versus syncope.  EEG and ambulatory EEG normal.  MRI with no significant abnormality.  At time of previous evaluation in October 2021 we felt that these were likely orthostatic mediated with component of increased vagal tone.  Her hydrochlorothiazide was discontinued and we asked her to increase hydration and sodium intake.  We also discussed compression hose.  Echocardiogram November 2021 showed normal LV function, grade 1 diastolic dysfunction, moderate left atrial enlargement, mild mitral regurgitation, trace aortic insufficiency.  Since last seen the patient denies any dyspnea on exertion, orthopnea, PND, pedal edema, palpitations, syncope or chest pain.   Current Outpatient Medications  Medication Sig Dispense Refill   diclofenac Sodium (VOLTAREN) 1 % GEL Apply 2 g topically 4 (four) times daily as needed (pain).     hydrochlorothiazide (HYDRODIURIL) 12.5 MG tablet Take 0.5 tablets (6.25 mg total) by mouth daily as needed (elevated bp). 45 tablet 3   metoprolol succinate (TOPROL-XL) 25 MG 24 hr tablet Take 1 tablet (25 mg total) by mouth daily. 90 tablet 1   NONFORMULARY OR COMPOUNDED ITEM [facility]:  Peripheral Neuropathy - Bupivacaine 1%, Doxepin 3%, Gabapentin 6%, Pentoxifylline 3%, Topiramate 1%. Apply 1-2 grams to affected area 3-4 times daily. (Patient taking differently: Apply 1 application  topically 4 (four) times daily as needed (neuropathy/pain (feet)). [facility]:  Peripheral Neuropathy - Bupivacaine 1%, Doxepin 3%, Gabapentin 6%, Pentoxifylline 3%, Topiramate 1%. Apply 1-2 grams to affected area 3-4 times daily.) 100 each 11   omeprazole (PRILOSEC) 20 MG capsule TAKE 1 CAPSULE BY MOUTH DAILY 90 capsule 1   pravastatin (PRAVACHOL) 20 MG tablet Take 1 tablet (20 mg total) by mouth daily. 90 tablet 1   pregabalin (LYRICA) 150 MG capsule Take 1 capsule (150 mg total) by mouth 2 (two) times  daily. 60 capsule 1   LORazepam (ATIVAN) 1 MG tablet TAKE 1 TABLET BY MOUTH TWICE A DAY AS NEEDED FOR ANXIETY **DO NOT COMBINE WITH ALCOHOL OR OTHER SEDATING SUBSTANCE** (Patient not taking: Reported on 01/06/2023) 60 tablet 1   No current facility-administered medications for this visit.     Past Medical History:  Diagnosis Date   Anxiety    Arthritis    Cancer (HCC)    lt. foot melanoma   Chronic kidney disease    lt. kidney cyst   Coronary artery disease    GERD (gastroesophageal reflux disease)    History of healed stress fracture 2013   bilateral feet   Hx of phlebitis    reports remote hx of "superfical blood clots" never anticoagulated   Hypertension    Osteopenia    Pneumonia     Past Surgical History:  Procedure Laterality Date   CATARACT EXTRACTION W/ INTRAOCULAR LENS IMPLANT Bilateral    CHOLECYSTECTOMY N/A 09/19/2016   Procedure: LAPAROSCOPIC CHOLECYSTECTOMY POSSIBLE INTRAOPERATIVE CHOLANGIOGRAM;  Surgeon: Emelia Loron, MD;  Location: [facility] OR;  Service: General;  Laterality: N/A;   ERCP N/A 09/18/2016   Procedure: ENDOSCOPIC RETROGRADE CHOLANGIOPANCREATOGRAPHY (ERCP);  Surgeon: Sherrilyn Rist, MD;  Location: [facility] ENDOSCOPY;  Service: Endoscopy;  Laterality: N/A;   FOOT SURGERY     patient reports four foot surgeries   FOOT SURGERY Left    bunion, hammer toe surgery   FOOT SURGERY Right    shaved bunion, hammer toe correction   HERNIA REPAIR     KNEE ARTHROSCOPY Right 06/02/1995   REPLACEMENT TOTAL KNEE Left  REPLACEMENT TOTAL KNEE Left 10/30/2012   RETINAL DETACHMENT SURGERY     RETINAL DETACHMENT SURGERY Left 06/01/2006   ROBOTIC ASSITED PARTIAL NEPHRECTOMY Left 06/03/2022   Procedure: XI ROBOTIC ASSITED PARTIAL NEPHRECTOMY WITH ULTRASOUND AND VENTRAL HERNIA REPAIR;  Surgeon: Sebastian Ache, MD;  Location: [facility] ORS;  Service: Urology;  Laterality: Left;  3 HRS   THYROIDECTOMY, PARTIAL Right    THYROIDECTOMY, PARTIAL  06/02/2003   TOTAL KNEE ARTHROPLASTY  Right 11/13/2019   Procedure: TOTAL KNEE ARTHROPLASTY SDDC;  Surgeon: Ollen Gross, MD;  Location: [facility] ORS;  Service: Orthopedics;  Laterality: Right;     Social History   Socioeconomic History   Marital status: Widowed    Spouse name: Not on file   Number of children: 3   Years of education: Not on file   Highest education level: 12th grade  Occupational History   Not on file  Tobacco Use   Smoking status: Former    Current packs/day: 0.00    Average packs/day: 0.3 packs/day for 15.0 years (3.8 ttl pk-yrs)    Types: Cigarettes    Start date: 06/01/1964    Quit date: 06/02/1979    Years since quitting: 43.6   Smokeless tobacco: Never  Vaping Use   Vaping status: Never Used  Substance and Sexual Activity   Alcohol use: Yes    Alcohol/week: 1.0 standard drink of alcohol    Types: 1 Glasses of wine per week    Comment: Occ   Drug use: No   Sexual activity: Not Currently  Other Topics Concern   Not on file  Social History Narrative   ** Merged History Encounter **       Married   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   3 children-[name] Dewaine Conger, son here, on daughter in [place]   7 grandchildren   1 great grandson    Left Handed   Social Determinants of Health   Financial Resource Strain: Low Risk  (10/18/2022)   Overall Financial Resource Strain (CARDIA)    Difficulty of Paying Living Expenses: Not very hard  Food Insecurity: No Food Insecurity (10/18/2022)   Hunger Vital Sign    Worried About Running Out of Food in the Last Year: Never true    Ran Out of Food in the Last Year: Never true  Transportation Needs: Unmet Transportation Needs (10/18/2022)   PRAPARE - Administrator, Civil Service (Medical): Yes    Lack of Transportation (Non-Medical): No  Physical Activity: Unknown (10/18/2022)   Exercise Vital Sign    Days of Exercise per Week: 0 days    Minutes of Exercise per Session: Not on file  Stress: No Stress Concern Present  (10/18/2022)   [employer] of Occupational Health - Occupational Stress Questionnaire    Feeling of Stress : Only a little  Social Connections: Moderately Integrated (10/18/2022)   Social Connection and Isolation Panel [NHANES]    Frequency of Communication with Friends and Family: More than three times a week    Frequency of Social Gatherings with Friends and Family: Three times a week    Attends Religious Services: More than 4 times per year    Active Member of Clubs or Organizations: Yes    Attends Banker Meetings: More than 4 times per year    Marital Status: Widowed  Intimate Partner Violence: Not At Risk (06/03/2022)   Humiliation, Afraid, Rape, and Kick questionnaire    Fear of Current or Ex-Partner:  No    Emotionally Abused: No    Physically Abused: No    Sexually Abused: No    Family History  Problem Relation Age of Onset   Heart disease Mother    Heart attack Mother    Heart disease Father    Diabetes Maternal Grandmother    Cancer Paternal Grandfather        stomach    ROS: no fevers or chills, productive cough, hemoptysis, dysphasia, odynophagia, melena, hematochezia, dysuria, hematuria, rash, seizure activity, orthopnea, PND, pedal edema, claudication. Remaining systems are negative.  Physical Exam: Well-developed well-nourished in no acute distress.  Skin is warm and dry.  HEENT is normal.  Neck is supple.  Chest is clear to auscultation with normal expansion.  Cardiovascular exam is regular rate and rhythm.  Abdominal exam nontender or distended. No masses palpated. Extremities show no edema. neuro grossly intact  EKG Interpretation Date/Time:  Wednesday January 06 2023 12:04:36 EDT Ventricular Rate:  56 PR Interval:  150 QRS Duration:  92 QT Interval:  430 QTC Calculation: 414 R Axis:   249  Text Interpretation: Sinus bradycardia Right superior axis deviation Low voltage QRS Cannot rule out Anterior infarct (cited on or before  06-Nov-2019) When compared with ECG of 06-Nov-2019 14:48, QRS axis Shifted left T wave inversion no longer evident in Inferior leads Confirmed by Olga Millers ([id number]) on 01/06/2023 12:06:29 PM    A/P  1 history of syncope-patient has done well since last office visit with no recurrences.  Continue increased sodium and fluid intake.  Note previous echocardiogram showed preserved LV function.  2 history of hypertension-we have allowed her blood pressure to run higher given history of orthostasis.  Will continue to follow.  Olga Millers, MD

## 2022-12-28 ENCOUNTER — Other Ambulatory Visit: Payer: Self-pay | Admitting: Family Medicine

## 2022-12-28 DIAGNOSIS — E785 Hyperlipidemia, unspecified: Secondary | ICD-10-CM

## 2023-01-04 ENCOUNTER — Ambulatory Visit: Payer: Medicare Other | Admitting: Podiatry

## 2023-01-04 DIAGNOSIS — M2061 Acquired deformities of toe(s), unspecified, right foot: Secondary | ICD-10-CM

## 2023-01-04 DIAGNOSIS — Q828 Other specified congenital malformations of skin: Secondary | ICD-10-CM | POA: Diagnosis not present

## 2023-01-06 ENCOUNTER — Encounter: Payer: Self-pay | Admitting: Cardiology

## 2023-01-06 ENCOUNTER — Ambulatory Visit (INDEPENDENT_AMBULATORY_CARE_PROVIDER_SITE_OTHER): Payer: Medicare Other | Admitting: Obstetrics & Gynecology

## 2023-01-06 ENCOUNTER — Ambulatory Visit: Payer: Medicare Other | Attending: Cardiology | Admitting: Cardiology

## 2023-01-06 ENCOUNTER — Encounter: Payer: Self-pay | Admitting: Obstetrics & Gynecology

## 2023-01-06 ENCOUNTER — Telehealth: Payer: Self-pay

## 2023-01-06 VITALS — BP 129/72 | HR 57 | Wt 172.0 lb

## 2023-01-06 VITALS — BP 138/82 | HR 56 | Resp 96 | Ht 61.0 in | Wt 172.8 lb

## 2023-01-06 DIAGNOSIS — Z87898 Personal history of other specified conditions: Secondary | ICD-10-CM | POA: Diagnosis not present

## 2023-01-06 DIAGNOSIS — I10 Essential (primary) hypertension: Secondary | ICD-10-CM

## 2023-01-06 DIAGNOSIS — N813 Complete uterovaginal prolapse: Secondary | ICD-10-CM

## 2023-01-06 DIAGNOSIS — K5900 Constipation, unspecified: Secondary | ICD-10-CM | POA: Diagnosis not present

## 2023-01-06 DIAGNOSIS — R55 Syncope and collapse: Secondary | ICD-10-CM | POA: Insufficient documentation

## 2023-01-06 NOTE — Patient Instructions (Signed)

## 2023-01-06 NOTE — Telephone Encounter (Signed)
Called patient and made her aware Dr. Erin Fulling wants her to take 6 capfuls of miralax for her constipation. Patient states understanding.  Armandina Stammer RN

## 2023-01-06 NOTE — Progress Notes (Signed)
HPI Pt presents today to have pessary removed and cleaned.  She reports that she had an ice cream sundae prior ot coming in. She feels like 'some thing is coming out' of her vagina today. She says she isn't sure if the pessary is falling out. She denies problems with the pessary and reports that it is  working very well.  She reports that since she was last here she's had surgery to her food, a bunion.  She denies further complaints.      Review of Systems: [place]       Objective:  BP 129/72   Pulse (!) 57   Wt 172 lb (78 kg)   BMI 32.50 kg/m     CONSTITUTIONAL: Well-developed, well-nourished female in no acute distress.  HENT:  Normocephalic, atraumatic EYES: Conjunctivae and EOM are normal. No scleral icterus.  NECK: Normal range of motion SKIN: Skin is warm and dry. No rash noted. Not diaphoretic.No pallor. NEUROLGIC: Alert and oriented to person, place, and time. Normal coordination.  GYN: Normal appearing external genitalia; no visible prolapse prior to exam; normal appearing vaginal mucosa and cervix. Normal discharge.  Pessary removed and cleaned; Pt has a LARGE amount of still palpable in her rectum,  ext gen no issues noted. No excoriations or breakdown of skin.    Assessment:  Pessary check  Pelvic organ prolapse Urinary incontinence- improved since pessary.   f/u in 3 months to have pessary removed and cleaned Needs annual in 1 year f/u sooner prn    Constipation Miralax 3 capsful with lots of water followed by 3 capsful of Miarlax q 4 hours until cleaned out.     L. Harraway-Smith, M.D., [facility]

## 2023-01-08 NOTE — Progress Notes (Signed)
Subjective: No chief complaint on file.   82 year old female presents for evaluation of painful calluses on the left foot.  They are causing pain again.  No open lesions.  She still has neuropathy symptoms and is on Lyrica which helps.  Objective: AAO x3, NAD- wearing regular sandal DP/PT pulses palpable bilaterally, CRT less than 3 seconds Status post first MPJ arthrodesis on the right foot.  Arthrodesis site is stable.  Recurrent abduction of this of the hallux.  Arthrodesis status stable.  No significant pain along this area today.  We discussed the position of the toe. Hyperkeratotic lesion submetatarsal left foot x 2 without any underlying ulceration drainage or signs of infection. No pain with calf compression, swelling, warmth, erythema  Assessment: Status post first MPJ arthrodesis; hyperkeratotic lesions  Plan: -All treatment options discussed with the patient including all alternatives, risks, complications.  -She is not interested in surgical intervention.  Continue offloading as well as shoes avoid any excess pressure.  Offered to get new x-rays of the right foot today. -As a courtesy debride the calluses x 2 with any complications or bleeding.  Moisturizer, offloading. -Continue Lyrica for neuropathy   Vivi Barrack DPM

## 2023-01-25 ENCOUNTER — Ambulatory Visit (INDEPENDENT_AMBULATORY_CARE_PROVIDER_SITE_OTHER): Payer: Medicare Other | Admitting: Podiatry

## 2023-01-25 DIAGNOSIS — L821 Other seborrheic keratosis: Secondary | ICD-10-CM | POA: Diagnosis not present

## 2023-01-25 DIAGNOSIS — M2061 Acquired deformities of toe(s), unspecified, right foot: Secondary | ICD-10-CM

## 2023-01-25 DIAGNOSIS — Z85820 Personal history of malignant melanoma of skin: Secondary | ICD-10-CM | POA: Diagnosis not present

## 2023-01-25 DIAGNOSIS — Q828 Other specified congenital malformations of skin: Secondary | ICD-10-CM

## 2023-01-25 DIAGNOSIS — L820 Inflamed seborrheic keratosis: Secondary | ICD-10-CM | POA: Diagnosis not present

## 2023-01-25 DIAGNOSIS — L578 Other skin changes due to chronic exposure to nonionizing radiation: Secondary | ICD-10-CM | POA: Diagnosis not present

## 2023-01-25 DIAGNOSIS — D2272 Melanocytic nevi of left lower limb, including hip: Secondary | ICD-10-CM | POA: Diagnosis not present

## 2023-01-25 DIAGNOSIS — L814 Other melanin hyperpigmentation: Secondary | ICD-10-CM | POA: Diagnosis not present

## 2023-01-25 DIAGNOSIS — D2261 Melanocytic nevi of right upper limb, including shoulder: Secondary | ICD-10-CM | POA: Diagnosis not present

## 2023-01-25 DIAGNOSIS — D225 Melanocytic nevi of trunk: Secondary | ICD-10-CM | POA: Diagnosis not present

## 2023-01-26 NOTE — Patient Instructions (Incomplete)
It was good to see you today, I am sorry your back is giving you so much difficulty  Recommend flu shot, COVID booster this fall  For pain control- ok to use the hydrocodone pills when needed.  Use as little as you can- alternate tylenol.  Ok to use an NSAID like ibuprofen or aleve on occasion as well   Remember the hydrocodone will make you drowsy- do not combine with lorazepam   Let me know how this works for you I will be in touch with your labs to make sure no sign of rheumatoid arthritis as well

## 2023-01-26 NOTE — Progress Notes (Unsigned)
Signed Abbe Amsterdam, MD[facility] [address], [address] [place], [place] [address] [phone]  Date:  01/28/2023   Name:  Margaret [name]   DOB:  [birth year]   MRN:  [id number]  PCP:  Pearline Cables, MD    Chief Complaint: Back Pain (Pt says the arthritis is severe now. Pain is a 10/10 in the mornings, and only gets better at about noon. )   History of Present Illness:  Margaret [name] is a 82 y.o. very pleasant female patient who presents with the following:  Patient seen today with concern of back pain.  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  I gave her some tramadol-she has to be very cautious with NSAIDs due to her renal function  MPRESSION: 1. Moderate to severe degenerative disc and endplate changes throughout the lower thoracic spine and lumbar spine. 2. Grade 1 anterolisthesis of L4 on L5 greater than L3 on L4, similar to prior CT.  She notes she is having more pain-her lower back is the most painful In the morning she is really bent over, it takes her a long time to straighten up and get going.   Her wrists and her neck are also painful  We have tried tramadol- it did not really help her unfortunately Desperate for relief, she recently used an old hydrocodone from a previous operation and found it helped her quite a bit  She is taking lyrica 150 BID also for her foot pain   Dr Ardelle Anton gave her 25 hydrocodone 5 back in February after bunion surgery She does not tolerate oxycodone  but does okay with hydrocodone Recommend COVID booster, flu shot this fall   Wt Readings from Last 3 Encounters:  01/06/23 172 lb (78 kg)  01/06/23 172 lb 12.8 oz (78.4 kg)  10/21/22 168 lb (76.2 kg)    Patient Active Problem List   Diagnosis Date Noted   Renal mass 06/03/2022   Open wound of left side of back 01/20/2021   [place] 11/18/2020   Lipoma of back 11/18/2020   Lymphedema 11/18/2020   Melanocytic nevi of right upper limb, including shoulder 11/18/2020   Personal history of malignant melanoma of skin 11/18/2020   Sebaceous cyst 11/18/2020   Seborrheic dermatitis 11/18/2020   Pre-diabetes 04/03/2020   OA (osteoarthritis) of knee 11/13/2019   Total knee replacement status, 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   Pain in right knee 08/03/2019   Fluid collection at surgical site 10/20/2018   Encounter for postoperative examination after surgery for malignant neoplasm 09/30/2018   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/2018   Plantar fasciitis of right foot 02/03/2017   Porokeratosis 02/03/2017   Acute gallstone pancreatitis    Calculus of gallbladder with acute cholecystitis and obstruction    LFT elevation    Stress fracture 01/01/2016   Insomnia 12/16/2015   Peripheral neuropathy 10/18/2015   Renal insufficiency 03/06/2015   HTN (hypertension) 03/06/2015   Osteopenia 03/06/2015   GERD (gastroesophageal reflux disease) 03/06/2015   Vaginal pessary present 03/06/2015   Metatarsalgia 07/13/2012  Increased frequency of urination 04/13/2012   DDD (degenerative disc disease), cervical 01/19/2012   Knee stiffness 01/19/2012   Low back pain 01/19/2012   Neck pain 01/19/2012   Pain of hand 01/19/2012   Hallux valgus 09/20/2011   Hyperkeratosis 08/13/2011   Anxiety disorder 07/06/2011   Depression 07/06/2011   Diverticulosis of large intestine 07/06/2011   Non-toxic multinodular goiter 07/06/2011   Atrophic vaginitis 10/24/2010   Dermatographic urticaria 09/17/2010   Inflamed  seborrheic keratosis 09/17/2010   Closed fracture of fifth metatarsal bone 07/10/2010   Diagnosis unknown 02/27/2010   Hip pain 06/08/2008   Pulmonary nodule seen on imaging study 06/08/2008    Past Medical History:  Diagnosis Date   Anxiety    Arthritis    Cancer (HCC)    lt. foot melanoma   Chronic kidney disease    lt. kidney cyst   Coronary artery disease    GERD (gastroesophageal reflux disease)    History of healed stress fracture 2013   bilateral feet   Hx of phlebitis    reports remote hx of "superfical blood clots" never anticoagulated   Hypertension    Osteopenia    Pneumonia     Past Surgical History:  Procedure Laterality Date   CATARACT EXTRACTION W/ INTRAOCULAR LENS IMPLANT Bilateral    CHOLECYSTECTOMY N/A 09/19/2016   Procedure: LAPAROSCOPIC CHOLECYSTECTOMY POSSIBLE INTRAOPERATIVE CHOLANGIOGRAM;  Surgeon: Emelia Loron, MD;  Location: [facility] OR;  Service: General;  Laterality: N/A;   ERCP N/A 09/18/2016   Procedure: ENDOSCOPIC RETROGRADE CHOLANGIOPANCREATOGRAPHY (ERCP);  Surgeon: Sherrilyn Rist, MD;  Location: [facility] ENDOSCOPY;  Service: Endoscopy;  Laterality: N/A;   FOOT SURGERY     patient reports four foot surgeries   FOOT SURGERY Left    bunion, hammer toe surgery   FOOT SURGERY Right    shaved bunion, hammer toe correction   HERNIA REPAIR     KNEE ARTHROSCOPY Right 06/02/1995   REPLACEMENT TOTAL KNEE Left    REPLACEMENT TOTAL KNEE Left 10/30/2012   RETINAL DETACHMENT SURGERY     RETINAL DETACHMENT SURGERY Left 06/01/2006   ROBOTIC ASSITED PARTIAL NEPHRECTOMY Left 06/03/2022   Procedure: XI ROBOTIC ASSITED PARTIAL NEPHRECTOMY WITH ULTRASOUND AND VENTRAL HERNIA REPAIR;  Surgeon: Sebastian Ache, MD;  Location: [facility] ORS;  Service: Urology;  Laterality: Left;  3 HRS   THYROIDECTOMY, PARTIAL Right    THYROIDECTOMY, PARTIAL  06/02/2003   TOTAL KNEE ARTHROPLASTY Right 11/13/2019   Procedure: TOTAL KNEE ARTHROPLASTY SDDC;  Surgeon: Ollen Gross, MD;   Location: [facility] ORS;  Service: Orthopedics;  Laterality: Right;     Social History   Tobacco Use   Smoking status: Former    Current packs/day: 0.00    Average packs/day: 0.3 packs/day for 15.0 years (3.8 ttl pk-yrs)    Types: Cigarettes    Start date: 06/01/1964    Quit date: 06/02/1979    Years since quitting: 43.6   Smokeless tobacco: Never  Vaping Use   Vaping status: Never Used  Substance Use Topics   Alcohol use: Yes    Alcohol/week: 1.0 standard drink of alcohol    Types: 1 Glasses of wine per week    Comment: Occ   Drug use: No    Family History  Problem Relation Age of Onset   Heart disease Mother    Heart attack Mother    Heart disease Father    Diabetes Maternal Grandmother    Cancer Paternal Grandfather        stomach  Allergies  Allergen Reactions   Keflex [Cephalexin] Diarrhea   Percocet [Oxycodone-Acetaminophen] Other (See Comments)    Syncope (passed out)   Clindamycin/Lincomycin Rash    Medication list has been reviewed and updated.  Current Outpatient Medications on File Prior to Visit  Medication Sig Dispense Refill   diclofenac Sodium (VOLTAREN) 1 % GEL Apply 2 g topically 4 (four) times daily as needed (pain).     hydrochlorothiazide (HYDRODIURIL) 12.5 MG tablet Take 0.5 tablets (6.25 mg total) by mouth daily as needed (elevated bp). 45 tablet 3   metoprolol succinate (TOPROL-XL) 25 MG 24 hr tablet Take 1 tablet (25 mg total) by mouth daily. 90 tablet 1   NONFORMULARY OR COMPOUNDED ITEM [facility]:  Peripheral Neuropathy - Bupivacaine 1%, Doxepin 3%, Gabapentin 6%, Pentoxifylline 3%, Topiramate 1%. Apply 1-2 grams to affected area 3-4 times daily. (Patient taking differently: Apply 1 application  topically 4 (four) times daily as needed (neuropathy/pain (feet)). [facility]:  Peripheral Neuropathy - Bupivacaine 1%, Doxepin 3%, Gabapentin 6%, Pentoxifylline 3%, Topiramate 1%. Apply 1-2 grams to affected area 3-4 times daily.) 100  each 11   omeprazole (PRILOSEC) 20 MG capsule TAKE 1 CAPSULE BY MOUTH DAILY 90 capsule 1   pravastatin (PRAVACHOL) 20 MG tablet Take 1 tablet (20 mg total) by mouth daily. 90 tablet 1   pregabalin (LYRICA) 150 MG capsule Take 1 capsule (150 mg total) by mouth 2 (two) times daily. 60 capsule 1   No current facility-administered medications on file prior to visit.    Review of Systems:  As per HPI- otherwise negative.   Physical Examination: Vitals:   01/28/23 1447  BP: 122/62  Pulse: 61  Resp: 18  Temp: 97.7 F (36.5 C)  SpO2: 94%   Vitals:   There is no height or weight on file to calculate BMI. Ideal Body Weight:    .GEN: no acute distress.  Overweight, kyphosis.  Looks well HEENT: Atraumatic, Normocephalic.  Ears and Nose: No external deformity. CV: RRR, No M/G/R. No JVD. No thrill. No extra heart sounds. PULM: CTA B, no wheezes, crackles, rhonchi. No retractions. No resp. distress. No accessory muscle use. EXTR: No c/c/e PSYCH: Normally interactive. Conversant.  She has significant degenerative changes in both hands, most pronounced in the left thumb  Assessment and Plan: Other chronic pain - Plan: DRUG MONITORING, PANEL 8 WITH CONFIRMATION, URINE, HYDROcodone-acetaminophen (NORCO/VICODIN) 5-325 MG tablet, DISCONTINUED: HYDROcodone-acetaminophen (NORCO/VICODIN) 5-325 MG tablet  Osteoarthritis of multiple joints, unspecified osteoarthritis type - Plan: Sedimentation rate, C-reactive protein, Cyclic citrul peptide antibody, IgG (QUEST), Rheumatoid Factor, HYDROcodone-acetaminophen (NORCO/VICODIN) 5-325 MG tablet, DISCONTINUED: HYDROcodone-acetaminophen (NORCO/VICODIN) 5-325 MG tablet  Patient seen today with concern of chronic pain due to arthritis in her back, wrist, hands, neck.  At 69 she is not interested in surgery, she is also not particular interested in epidural steroid injection or other orthopedic or neurosurgical treatment.  She is using Tylenol, she has avoided  NSAIDs due to concerns about her kidneys.  We have tried tramadol which did not provide relief.  She had some hydrocodone leftover which she tried, this did help her quite a bit  Advised we can treat her with hydrocodone as needed for her chronic pain, but we will need to monitor this carefully-she is aware this medication can be habit-forming.  She is willing to comply with annual urine drug screens and agrees to get narcotics only for myself unless an emergency (travel, accident etc.)  We also will obtain some blood work to rule  out the possibility of rheumatoid arthritis  We discussed safe use of acetaminophen and acetaminophen daily limits.  Also advised her likely okay to use NSAIDs on occasion if she would like  Signed Abbe Amsterdam, MD

## 2023-01-28 ENCOUNTER — Ambulatory Visit (INDEPENDENT_AMBULATORY_CARE_PROVIDER_SITE_OTHER): Payer: Medicare Other | Admitting: Family Medicine

## 2023-01-28 VITALS — BP 122/62 | HR 61 | Temp 97.7°F | Resp 18

## 2023-01-28 DIAGNOSIS — G8929 Other chronic pain: Secondary | ICD-10-CM | POA: Diagnosis not present

## 2023-01-28 DIAGNOSIS — M159 Polyosteoarthritis, unspecified: Secondary | ICD-10-CM | POA: Diagnosis not present

## 2023-01-28 MED ORDER — HYDROCODONE-ACETAMINOPHEN 5-325 MG PO TABS
0.5000 | ORAL_TABLET | Freq: Three times a day (TID) | ORAL | 0 refills | Status: DC | PRN
Start: 2023-01-28 — End: 2023-01-28

## 2023-01-28 MED ORDER — HYDROCODONE-ACETAMINOPHEN 5-325 MG PO TABS: 0.5000 | ORAL_TABLET | Freq: Three times a day (TID) | ORAL | 0 refills | Status: DC | PRN

## 2023-01-28 NOTE — Progress Notes (Signed)
Subjective: Chief Complaint  Patient presents with   Callouses     82 year old female presents for evaluation of painful calluses on the left foot.  He has ongoing pain.  She is on Lyrica for neuropathy.  No open lesions.  She is wearing a regular shoe on the right foot.  She is concerned because the third toe is starting to come over towards the big toe as well at this time.  Objective: AAO x3, NAD- wearing regular sandal DP/PT pulses palpable bilaterally, CRT less than 3 seconds Status post first MPJ arthrodesis on the right foot.  Arthrodesis site is stable.  Recurrent abduction of this of the hallux.  Arthrodesis status stable.  Third toe is stretching over medially starting to touch the big toe.  There is no skin breakdown or irritation. Hyperkeratotic lesion submetatarsal left foot x 2 without any underlying ulceration drainage or signs of infection. No pain with calf compression, swelling, warmth, erythema  Assessment: Distal point of right foot; hyperkeratotic lesions  Plan: -All treatment options discussed with the patient including all alternatives, risks, complications.  -Regards to the right foot eligible for any further surgical intervention at this time.  She is asked for amputation of the third toe but I think is just going to cause a chain reaction and the others will continue to deviate as well.  Continue shoes to avoid excess pressure.  Only bothers her shoes so hopefully changing shoes will be beneficial.  Offloading pads. -Sharply debride the calluses x 2 with any complications or bleeding.  Moisturizer, offloading. -Continue Lyrica for neuropathy   Vivi Barrack DPM

## 2023-01-29 ENCOUNTER — Encounter: Payer: Self-pay | Admitting: Family Medicine

## 2023-01-29 LAB — DRUG MONITORING, PANEL 8 WITH CONFIRMATION, URINE
6 Acetylmorphine: NEGATIVE ng/mL (ref <10)
Alcohol Metabolites: NEGATIVE ng/mL (ref <500)
Amphetamines: NEGATIVE ng/mL (ref <500)
Benzodiazepines: NEGATIVE ng/mL (ref <100)
Buprenorphine, Urine: NEGATIVE ng/mL (ref <5)
Cocaine Metabolite: NEGATIVE ng/mL (ref <150)
Creatinine: 46 mg/dL (ref >=20.0)
MDMA: NEGATIVE ng/mL (ref <500)
Marijuana Metabolite: NEGATIVE ng/mL (ref <20)
Opiates: NEGATIVE ng/mL (ref <100)
Oxidant: NEGATIVE ug/mL (ref <200)
Oxycodone: NEGATIVE ng/mL (ref <100)
pH: 5.1 (ref 4.5–9.0)

## 2023-01-29 LAB — C-REACTIVE PROTEIN: CRP: <1 mg/dL (ref 0.5–20.0)

## 2023-01-29 LAB — DM TEMPLATE

## 2023-01-29 LAB — SEDIMENTATION RATE: Sed Rate: 6 mm/h (ref 0–30)

## 2023-01-31 LAB — CYCLIC CITRUL PEPTIDE ANTIBODY, IGG: Cyclic Citrullin Peptide Ab: <16 UNITS

## 2023-01-31 LAB — RHEUMATOID FACTOR: Rheumatoid fact SerPl-aCnc: <10 [IU]/mL (ref <14)

## 2023-02-03 ENCOUNTER — Encounter: Payer: Self-pay | Admitting: Family Medicine

## 2023-02-03 DIAGNOSIS — G8929 Other chronic pain: Secondary | ICD-10-CM

## 2023-02-03 DIAGNOSIS — M159 Polyosteoarthritis, unspecified: Secondary | ICD-10-CM

## 2023-02-03 MED ORDER — HYDROCODONE-ACETAMINOPHEN 5-325 MG PO TABS: 0.5000 | ORAL_TABLET | Freq: Three times a day (TID) | ORAL | 0 refills | Status: DC | PRN

## 2023-02-15 ENCOUNTER — Ambulatory Visit (INDEPENDENT_AMBULATORY_CARE_PROVIDER_SITE_OTHER): Payer: Medicare Other | Admitting: Podiatry

## 2023-02-15 DIAGNOSIS — M2061 Acquired deformities of toe(s), unspecified, right foot: Secondary | ICD-10-CM | POA: Diagnosis not present

## 2023-02-15 DIAGNOSIS — Q828 Other specified congenital malformations of skin: Secondary | ICD-10-CM | POA: Diagnosis not present

## 2023-02-15 DIAGNOSIS — M21619 Bunion of unspecified foot: Secondary | ICD-10-CM | POA: Diagnosis not present

## 2023-02-15 DIAGNOSIS — G629 Polyneuropathy, unspecified: Secondary | ICD-10-CM

## 2023-02-15 NOTE — Progress Notes (Signed)
Subjective: Chief Complaint  Patient presents with   Callouses     82 year old female presents for evaluation of painful calluses on the both feet.  She states they are causing discomfort again.  She has tried to get moisturizer on them.  She is on Lyrica for neuropathy as well which has been helping.    Objective: AAO x3, NAD- wearing regular sandal DP/PT pulses palpable bilaterally, CRT less than 3 seconds Status post first MPJ arthrodesis on the right foot.  Arthrodesis site is stable.  Recurrent abduction of this of the hallux.  There is no pain associate with this today.  The third toe is abutting the hallux.  Previous second toe amputation. Hyperkeratotic lesion submetatarsal left foot x 2 in the right foot x 1 without any underlying ulceration drainage or signs of infection. No pain with calf compression, swelling, warmth, erythema  Assessment: Distal point of right foot; hyperkeratotic lesions  Plan: -All treatment options discussed with the patient including all alternatives, risks, complications.  -Continue Lyrica for neuropathy -Offloading for the bunion, digital deformities. -Sharply debrided calluses x 3 without any complications or bleeding.  Continue moisturizer, offloading.  Return in about 3 weeks (around 03/08/2023).  Vivi Barrack DPM

## 2023-02-22 ENCOUNTER — Other Ambulatory Visit: Payer: Self-pay | Admitting: Podiatry

## 2023-02-23 ENCOUNTER — Other Ambulatory Visit: Payer: Self-pay | Admitting: Podiatry

## 2023-02-23 ENCOUNTER — Telehealth: Payer: Self-pay | Admitting: Podiatry

## 2023-02-23 MED ORDER — PREGABALIN 150 MG PO CAPS: 150.0000 mg | ORAL_CAPSULE | Freq: Two times a day (BID) | ORAL | 1 refills | Status: DC

## 2023-02-23 NOTE — Telephone Encounter (Signed)
Notified pt medication was sent in and she said thank you so much.

## 2023-02-23 NOTE — Telephone Encounter (Signed)
Pt called and is out of her pregabilin medication and is needing a refill sent in as soon as possible please. Pharmacy is correct in system

## 2023-02-23 NOTE — Progress Notes (Signed)
Refill of lyrica sent.

## 2023-03-03 DIAGNOSIS — Z23 Encounter for immunization: Secondary | ICD-10-CM | POA: Diagnosis not present

## 2023-03-08 ENCOUNTER — Ambulatory Visit (INDEPENDENT_AMBULATORY_CARE_PROVIDER_SITE_OTHER): Payer: Medicare Other | Admitting: Podiatry

## 2023-03-08 ENCOUNTER — Encounter: Payer: Self-pay | Admitting: Podiatry

## 2023-03-08 VITALS — Ht 61.0 in | Wt 172.0 lb

## 2023-03-08 DIAGNOSIS — G629 Polyneuropathy, unspecified: Secondary | ICD-10-CM | POA: Diagnosis not present

## 2023-03-08 DIAGNOSIS — M2061 Acquired deformities of toe(s), unspecified, right foot: Secondary | ICD-10-CM | POA: Diagnosis not present

## 2023-03-08 DIAGNOSIS — Q828 Other specified congenital malformations of skin: Secondary | ICD-10-CM

## 2023-03-15 NOTE — Progress Notes (Signed)
Subjective: Chief Complaint  Patient presents with   Callouses    Patient is here for 3 week F/U for callouses and neuropathy     82 year old female presents for evaluation of painful calluses on the both feet.  She states they are causing discomfort again.  She is continuing moisturizer.  She is also on Lyrica for neuropathy which helps her foot pain as well.  Objective: AAO x3, NAD- wearing regular sandal DP/PT pulses palpable bilaterally, CRT less than 3 seconds Status post first MPJ arthrodesis on the right foot.  Arthrodesis site is stable.  Recurrent abduction of this of the hallux.  There is no pain associate with this today.  The third toe is abutting the hallux.  Previous second toe amputation. Hyperkeratotic lesion submetatarsal left foot x 2 in the right foot x 1 without any underlying ulceration drainage or signs of infection.  Seems to be a little bit thicker on the left foot today compared to what it had been previously. No pain with calf compression, swelling, warmth, erythema  Assessment: Distal point of right foot; hyperkeratotic lesions  Plan: -All treatment options discussed with the patient including all alternatives, risks, complications.  -Continue Lyrica for neuropathy -Offloading for the bunion, digital deformities. -Sharply debrided calluses x 3 without any complications or bleeding.  Continue moisturizer, offloading.  We discussed excision of the lesions.  She will consider the options.  Return in about 3 weeks (around 03/29/2023).  Vivi Barrack DPM

## 2023-03-29 ENCOUNTER — Ambulatory Visit (INDEPENDENT_AMBULATORY_CARE_PROVIDER_SITE_OTHER): Payer: Medicare Other | Admitting: Podiatry

## 2023-03-29 ENCOUNTER — Encounter: Payer: Self-pay | Admitting: Podiatry

## 2023-03-29 DIAGNOSIS — G8929 Other chronic pain: Secondary | ICD-10-CM

## 2023-03-29 DIAGNOSIS — L989 Disorder of the skin and subcutaneous tissue, unspecified: Secondary | ICD-10-CM | POA: Diagnosis not present

## 2023-03-29 DIAGNOSIS — M79672 Pain in left foot: Secondary | ICD-10-CM

## 2023-04-04 NOTE — Progress Notes (Signed)
Subjective: Chief Complaint  Patient presents with   Routine Post Op    PATIENT STATES THAT EVERYTHING IS GOING GREAT     82 year old female presents for evaluation of painful calluses on the both feet.  She states the calluses are causing pain again but otherwise has been doing well.  No drainage.  No splinter redness.  She is on Lyrica for neuropathy.  Objective: AAO x3, NAD- wearing regular shoe; friend present  DP/PT pulses palpable bilaterally, CRT less than 3 seconds Hyperkeratotic lesion submetatarsal left foot x 2.no significant formation on the right foot.  There is no underlying ulceration drainage or signs of infection.  Seems to be a little bit thicker on the left foot today compared to what it had been previously. No pain with calf compression, swelling, warmth, erythema  Assessment: Distal point of right foot; hyperkeratotic lesions  Plan: -All treatment options discussed with the patient including all alternatives, risks, complications.  -Continue Lyrica for neuropathy -Offloading for the bunion, digital deformities. -Sharply debrided calluses x 2 without any complications or bleeding.  Continue moisturizer, offloading.  We can discuss surgical excision of the lesions, biopsy.  She would likely plan on doing this after the first of the year.  Return in about 3 weeks (around 04/19/2023) for callus, surgery consult.  Vivi Barrack DPM

## 2023-04-05 ENCOUNTER — Encounter: Payer: Self-pay | Admitting: Family Medicine

## 2023-04-05 MED ORDER — CYCLOBENZAPRINE HCL 10 MG PO TABS: 5.0000 mg | ORAL_TABLET | Freq: Two times a day (BID) | ORAL | 0 refills | Status: DC | PRN

## 2023-04-12 NOTE — Progress Notes (Addendum)
[facility] [address] [place], [place] [address] [phone]  Date:  04/15/2023   Name:  Margaret [name]   DOB:  Jul 04, 1940   MRN:  [id number]  PCP:  Pearline Cables, MD    Chief Complaint: Back Pain (Low back region. She has been taking muscle relaxer and/or pain med)   History of Present Illness:  Margaret [name] is a 82 y.o. very pleasant female patient who presents with the following:  Patient seen today for follow-up and to discuss back pain.  Most recent visit with myself was in August-notes from that visit: Patient seen today with concern of back pain.  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  I gave her some tramadol-she has to be very cautious with NSAIDs due to her renal function  MPRESSION: 1. Moderate to severe degenerative disc and endplate changes throughout the lower thoracic spine and lumbar spine. 2. Grade 1 anterolisthesis of L4 on L5 greater than L3 on L4, similar to prior CT. She notes she is having more pain-her lower back is the most painful In the morning she is really bent over, it takes her a long time to straighten up and get going.   Her wrists and her neck are also painful We have tried tramadol- it did not really help her unfortunately Desperate for relief, she recently used an old hydrocodone from a previous operation and found it helped her quite a bit She is taking lyrica 150 BID also for her foot pain   We also got hip films in November-these also showed  end-stage degenerative joint disease: IMPRESSION: 1. 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 2. Lower lumbar spondylosis. 3. Calcified left eccentric uterine fibroid. 4. Prominence of stool in the pelvic colon, cannot exclude constipation.  -At her last visit we got a urine drug screen and I provided prescription for hydrocodone to use as needed She notes this is helping with her pain control  Flu shot- done  COVID-19 booster  She tends to alternate her hydrocodone and flexeril She notes in the am she is really bent over and it takes her 2-3 hours to be able to stand up straight She will apply voltaren, they may try some massage and take a pain pill.  She does not take the hydrocodone every day She does not feel that her medications make her drowsy at all Patient Active Problem List   Diagnosis Date Noted   Renal mass 06/03/2022   Open wound of left side of back 01/20/2021   Comedone 11/18/2020   Lipoma of back 11/18/2020   Lymphedema 11/18/2020   Melanocytic nevi of right upper limb, including shoulder 11/18/2020   Personal history of malignant melanoma of skin 11/18/2020   Sebaceous cyst 11/18/2020   Seborrheic dermatitis 11/18/2020   Pre-diabetes 04/03/2020   OA (osteoarthritis) of knee 11/13/2019   Total knee replacement status, 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   Pain in right knee 08/03/2019   Fluid collection at surgical site 10/20/2018   Encounter for  postoperative examination after surgery for malignant neoplasm 09/30/2018   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/2018   Plantar fasciitis of right foot 02/03/2017   Porokeratosis 02/03/2017   Acute gallstone pancreatitis    Calculus of gallbladder with acute cholecystitis and obstruction    LFT elevation    Stress fracture 01/01/2016   Insomnia 12/16/2015   Peripheral neuropathy 10/18/2015   Renal insufficiency 03/06/2015   HTN (hypertension) 03/06/2015   Osteopenia 03/06/2015   GERD  (gastroesophageal reflux disease) 03/06/2015   Vaginal pessary present 03/06/2015   Metatarsalgia 07/13/2012   Increased frequency of urination 04/13/2012   DDD (degenerative disc disease), cervical 01/19/2012   Knee stiffness 01/19/2012   Low back pain 01/19/2012   Neck pain 01/19/2012   Pain of hand 01/19/2012   Hallux valgus 09/20/2011   Hyperkeratosis 08/13/2011   Anxiety disorder 07/06/2011   Depression 07/06/2011   Diverticulosis of large intestine 07/06/2011   Non-toxic multinodular goiter 07/06/2011   Atrophic vaginitis 10/24/2010   Dermatographic urticaria 09/17/2010   Inflamed seborrheic keratosis 09/17/2010   Closed fracture of fifth metatarsal bone 07/10/2010   Diagnosis unknown 02/27/2010   Hip pain 06/08/2008   Pulmonary nodule seen on imaging study 06/08/2008    Past Medical History:  Diagnosis Date   Anxiety    Arthritis    Cancer (HCC)    lt. foot melanoma   Chronic kidney disease    lt. kidney cyst   Coronary artery disease    GERD (gastroesophageal reflux disease)    History of healed stress fracture 2013   bilateral feet   Hx of phlebitis    reports remote hx of "superfical blood clots" never anticoagulated   Hypertension    Osteopenia    Pneumonia     Past Surgical History:  Procedure Laterality Date   CATARACT EXTRACTION W/ INTRAOCULAR LENS IMPLANT Bilateral    CHOLECYSTECTOMY N/A 09/19/2016   Procedure: LAPAROSCOPIC CHOLECYSTECTOMY POSSIBLE INTRAOPERATIVE CHOLANGIOGRAM;  Surgeon: Emelia Loron, MD;  Location: [facility] OR;  Service: General;  Laterality: N/A;   ERCP N/A 09/18/2016   Procedure: ENDOSCOPIC RETROGRADE CHOLANGIOPANCREATOGRAPHY (ERCP);  Surgeon: Sherrilyn Rist, MD;  Location: [facility] ENDOSCOPY;  Service: Endoscopy;  Laterality: N/A;   FOOT SURGERY     patient reports four foot surgeries   FOOT SURGERY Left    bunion, hammer toe surgery   FOOT SURGERY Right    shaved bunion, hammer toe correction   HERNIA REPAIR     KNEE  ARTHROSCOPY Right 06/02/1995   REPLACEMENT TOTAL KNEE Left    REPLACEMENT TOTAL KNEE Left 10/30/2012   RETINAL DETACHMENT SURGERY     RETINAL DETACHMENT SURGERY Left 06/01/2006   ROBOTIC ASSITED PARTIAL NEPHRECTOMY Left 06/03/2022   Procedure: XI ROBOTIC ASSITED PARTIAL NEPHRECTOMY WITH ULTRASOUND AND VENTRAL HERNIA REPAIR;  Surgeon: Sebastian Ache, MD;  Location: [facility] ORS;  Service: Urology;  Laterality: Left;  3 HRS   THYROIDECTOMY, PARTIAL Right    THYROIDECTOMY, PARTIAL  06/02/2003   TOTAL KNEE ARTHROPLASTY Right 11/13/2019   Procedure: TOTAL KNEE ARTHROPLASTY SDDC;  Surgeon: Ollen Gross, MD;  Location: [facility] ORS;  Service: Orthopedics;  Laterality: Right;     Social History   Tobacco Use   Smoking status: Former    Current packs/day: 0.00    Average packs/day: 0.3 packs/day for 15.0 years (3.8 ttl pk-yrs)    Types: Cigarettes    Start date: 06/01/1964    Quit date: 06/02/1979    Years since quitting: 43.8   Smokeless  tobacco: Never  Vaping Use   Vaping status: Never Used  Substance Use Topics   Alcohol use: Yes    Alcohol/week: 1.0 standard drink of alcohol    Types: 1 Glasses of wine per week    Comment: Occ   Drug use: No    Family History  Problem Relation Age of Onset   Heart disease Mother    Heart attack Mother    Heart disease Father    Diabetes Maternal Grandmother    Cancer Paternal Grandfather        stomach    Allergies  Allergen Reactions   Keflex [Cephalexin] Diarrhea   Percocet [Oxycodone-Acetaminophen] Other (See Comments)    Syncope (passed out)   Clindamycin/Lincomycin Rash    Medication list has been reviewed and updated.  Current Outpatient Medications on File Prior to Visit  Medication Sig Dispense Refill   diclofenac Sodium (VOLTAREN) 1 % GEL Apply 2 g topically 4 (four) times daily as needed (pain).     hydrochlorothiazide (HYDRODIURIL) 12.5 MG tablet Take 0.5 tablets (6.25 mg total) by mouth daily as needed (elevated bp). 45 tablet 3    metoprolol succinate (TOPROL-XL) 25 MG 24 hr tablet Take 1 tablet (25 mg total) by mouth daily. 90 tablet 1   NONFORMULARY OR COMPOUNDED ITEM [facility]:  Peripheral Neuropathy - Bupivacaine 1%, Doxepin 3%, Gabapentin 6%, Pentoxifylline 3%, Topiramate 1%. Apply 1-2 grams to affected area 3-4 times daily. (Patient taking differently: Apply 1 application  topically 4 (four) times daily as needed (neuropathy/pain (feet)). [facility]:  Peripheral Neuropathy - Bupivacaine 1%, Doxepin 3%, Gabapentin 6%, Pentoxifylline 3%, Topiramate 1%. Apply 1-2 grams to affected area 3-4 times daily.) 100 each 11   pravastatin (PRAVACHOL) 20 MG tablet Take 1 tablet (20 mg total) by mouth daily. 90 tablet 1   pregabalin (LYRICA) 150 MG capsule Take 1 capsule (150 mg total) by mouth 2 (two) times daily. 60 capsule 1   No current facility-administered medications on file prior to visit.    Review of Systems:  As per HPI- otherwise negative.   Physical Examination: Vitals:   04/15/23 1521  BP: 120/70  Pulse: 65  Resp: 18  Temp: 98.2 F (36.8 C)  SpO2: 98%   Vitals:   04/15/23 1521  Weight: 175 lb (79.4 kg)   Body mass index is 33.07 kg/m. Ideal Body Weight:    GEN: no acute distress.  Mildly obese, looks well HEENT: Atraumatic, Normocephalic.  Ears and Nose: No external deformity. CV: RRR, No M/G/R. No JVD. No thrill. No extra heart sounds. PULM: CTA B, no wheezes, crackles, rhonchi. No retractions. No resp. distress. No accessory muscle use. EXTR: No c/c/e PSYCH: Normally interactive. Conversant.  Moves slowly and stiffly consistent with severe spine disease  Assessment and Plan: Primary hypertension - Plan: Basic metabolic panel  Other chronic pain - Plan: HYDROcodone-acetaminophen (NORCO/VICODIN) 5-325 MG tablet, cyclobenzaprine (FLEXERIL) 10 MG tablet  Osteoarthritis of multiple joints, unspecified osteoarthritis type - Plan: HYDROcodone-acetaminophen (NORCO/VICODIN)  5-325 MG tablet  Gastroesophageal reflux disease - Plan: omeprazole (PRILOSEC) 20 MG capsule  Patient seen today for follow-up of chronic pain due to arthritis.  I gave her a prescription for hydrocodone back in September, she is taking this as well as cyclobenzaprine, additional acetaminophen, topical Voltaren.  She is getting some relief from this regimen.  Advised possibility of habit formation with hydrocodone.  Encouraged her to take as little as she can but is much as as needed to maintain reasonable comfort  and quality of life.  We discussed the possibility of hip replacement surgery-she would like to think about this.  She is not interested in back surgery Follow-up on mild renal insufficiency today-monitor BMP Blood pressure well-controlled  It was good to see you today- I will be in touch with your labs asap Use the hydrocodone as needed for pain during the day and a dose of cyclobenzaprine/ flexeril at night; however if you find a different schedule works better for you that is ok!   Both of these medications can make you drowsy- avoid using together or when you need to drive  Let me know if you would like to see orthopedics about your hips   Assuming all is well please see me in about 6 months - I can call in more medication in the meantime as needed  Signed Abbe Amsterdam, MD  Addendum 11/15, received labs as below, message to patient  Results for orders placed or performed in visit on 04/15/23  Basic metabolic panel  Result Value Ref Range   Sodium 141 135 - 145 mEq/L   Potassium 3.9 3.5 - 5.1 mEq/L   Chloride 102 96 - 112 mEq/L   CO2 28 19 - 32 mEq/L   Glucose, Bld 97 70 - 99 mg/dL   BUN 24 (H) 6 - 23 mg/dL   Creatinine, Ser 1.61 0.40 - 1.20 mg/dL   GFR 09.60 (L) >45.40 mL/min   Calcium 9.8 8.4 - 10.5 mg/dL

## 2023-04-14 ENCOUNTER — Encounter: Payer: Self-pay | Admitting: Obstetrics & Gynecology

## 2023-04-14 ENCOUNTER — Ambulatory Visit: Payer: Medicare Other | Admitting: Obstetrics & Gynecology

## 2023-04-14 VITALS — BP 133/80 | HR 70 | Wt 169.0 lb

## 2023-04-14 DIAGNOSIS — N814 Uterovaginal prolapse, unspecified: Secondary | ICD-10-CM

## 2023-04-14 DIAGNOSIS — N3944 Nocturnal enuresis: Secondary | ICD-10-CM | POA: Diagnosis not present

## 2023-04-14 NOTE — Progress Notes (Signed)
HPI Pt presents today to have pessary removed and cleaned.  She reports leaking urine at night only. She does not leak during the day and does not wear pads during the day. She is not interested in having surgery. She does not want to try to resize the pessary again. She denies further complaints.     Review of Systems:         Objective:  BP 133/80   Pulse 70   Wt 169 lb (76.7 kg)   BMI 31.93 kg/m   CONSTITUTIONAL: Well-developed, well-nourished female in no acute distress.  HENT:  Normocephalic, atraumatic EYES: Conjunctivae and EOM are normal. No scleral icterus.  NECK: Normal range of motion SKIN: Skin is warm and dry. No rash noted. Not diaphoretic.No pallor. NEUROLGIC: Alert and oriented to person, place, and time. Normal coordination.  GYN: Pessary removed and cleaned; ext gen no issues noted. No excoriations or breakdown of skin      Diagnoses and all orders for this visit:  Uterovaginal prolapse  Urinary incontinence, nocturnal enuresis  Pessary check  Pelvic organ prolapse Urinary incontinence- only at night. No further eval desired.       F/u in 3 months to have pessary removed and cleaned.  F/u sooner prn   Layton Tappan L. Harraway-Smith, M.D., [facility]

## 2023-04-15 ENCOUNTER — Ambulatory Visit (INDEPENDENT_AMBULATORY_CARE_PROVIDER_SITE_OTHER): Payer: Medicare Other | Admitting: Family Medicine

## 2023-04-15 VITALS — BP 120/70 | HR 65 | Temp 98.2°F | Resp 18 | Wt 175.0 lb

## 2023-04-15 DIAGNOSIS — I10 Essential (primary) hypertension: Secondary | ICD-10-CM | POA: Diagnosis not present

## 2023-04-15 DIAGNOSIS — N289 Disorder of kidney and ureter, unspecified: Secondary | ICD-10-CM

## 2023-04-15 DIAGNOSIS — K219 Gastro-esophageal reflux disease without esophagitis: Secondary | ICD-10-CM

## 2023-04-15 DIAGNOSIS — G8929 Other chronic pain: Secondary | ICD-10-CM | POA: Diagnosis not present

## 2023-04-15 DIAGNOSIS — M159 Polyosteoarthritis, unspecified: Secondary | ICD-10-CM

## 2023-04-15 MED ORDER — OMEPRAZOLE 20 MG PO CPDR: 20.0000 mg | DELAYED_RELEASE_CAPSULE | Freq: Every day | ORAL | 1 refills | Status: DC

## 2023-04-15 MED ORDER — CYCLOBENZAPRINE HCL 10 MG PO TABS: 5.0000 mg | ORAL_TABLET | Freq: Two times a day (BID) | ORAL | 2 refills | Status: DC | PRN

## 2023-04-15 MED ORDER — HYDROCODONE-ACETAMINOPHEN 5-325 MG PO TABS: 0.5000 | ORAL_TABLET | Freq: Three times a day (TID) | ORAL | 0 refills | Status: DC | PRN

## 2023-04-15 NOTE — Patient Instructions (Signed)
It was good to see you today- I will be in touch with your labs asap Use the hydrocodone as needed for pain during the day and a dose of cyclobenzaprine/ flexeril at night; however if you find a different schedule works better for you that is ok!   Both of these medications can make you drowsy- avoid using together or when you need to drive  Let me know if you would like to see orthopedics about your hips   Assuming all is well please see me in about 6 months - I can call in more medication in the meantime as needed

## 2023-04-16 ENCOUNTER — Encounter: Payer: Self-pay | Admitting: Family Medicine

## 2023-04-16 LAB — BASIC METABOLIC PANEL
BUN: 24 mg/dL — ABNORMAL HIGH (ref 6–23)
CO2: 28 meq/L (ref 19–32)
Calcium: 9.8 mg/dL (ref 8.4–10.5)
Chloride: 102 meq/L (ref 96–112)
Creatinine, Ser: 1 mg/dL (ref 0.40–1.20)
GFR: 52.41 mL/min — ABNORMAL LOW (ref >60.00)
Glucose, Bld: 97 mg/dL (ref 70–99)
Potassium: 3.9 meq/L (ref 3.5–5.1)
Sodium: 141 meq/L (ref 135–145)

## 2023-04-20 ENCOUNTER — Encounter: Payer: Self-pay | Admitting: Podiatry

## 2023-04-20 ENCOUNTER — Ambulatory Visit (INDEPENDENT_AMBULATORY_CARE_PROVIDER_SITE_OTHER): Payer: Medicare Other | Admitting: Podiatry

## 2023-04-20 DIAGNOSIS — G629 Polyneuropathy, unspecified: Secondary | ICD-10-CM

## 2023-04-20 DIAGNOSIS — L989 Disorder of the skin and subcutaneous tissue, unspecified: Secondary | ICD-10-CM | POA: Diagnosis not present

## 2023-04-20 MED ORDER — PREGABALIN 150 MG PO CAPS: 150.0000 mg | ORAL_CAPSULE | Freq: Two times a day (BID) | ORAL | 1 refills | Status: DC

## 2023-04-20 NOTE — Progress Notes (Signed)
Subjective: Chief Complaint  Patient presents with   Callouses    RM#11 Calluse follow up still has to think about surgery     82 year old female presents for evaluation of painful calluses on the both feet.  She states the calluses are causing pain again but otherwise has been doing well.  She is considering having the lesions excised as we discussed but likely for the first of the year.  She needs refill on Lyrica.  This has been doing well for her from a neuropathy standpoint.  Objective: AAO x3, NAD- wearing regular shoe; friend present  DP/PT pulses palpable bilaterally, CRT less than 3 seconds Hyperkeratotic lesion submetatarsal left foot x 2. There is no significant formation on the right foot.  There is no underlying ulceration drainage or signs of infection.  Seems to be a little bit thicker on the left foot today compared to what it had been previously. No pain with calf compression, swelling, warmth, erythema  Assessment: Distal point of right foot; hyperkeratotic lesions  Plan: -All treatment options discussed with the patient including all alternatives, risks, complications.  -Continue Lyrica for neuropathy- refilled today  -Offloading for the bunion, digital deformities. -Sharply debrided calluses x 2 without any complications or bleeding.  Continue moisturizer, offloading.  We can discuss surgical excision of the lesions, biopsy.  She would likely plan on doing this after the first of the year.  Return in about 3 weeks (around 05/11/2023).  Vivi Barrack DPM

## 2023-04-23 ENCOUNTER — Other Ambulatory Visit: Payer: Self-pay | Admitting: Family Medicine

## 2023-04-24 ENCOUNTER — Other Ambulatory Visit: Payer: Self-pay | Admitting: Family Medicine

## 2023-04-24 DIAGNOSIS — I10 Essential (primary) hypertension: Secondary | ICD-10-CM

## 2023-05-10 ENCOUNTER — Encounter: Payer: Self-pay | Admitting: Podiatry

## 2023-05-10 ENCOUNTER — Ambulatory Visit (INDEPENDENT_AMBULATORY_CARE_PROVIDER_SITE_OTHER): Payer: Medicare Other | Admitting: Podiatry

## 2023-05-10 DIAGNOSIS — L989 Disorder of the skin and subcutaneous tissue, unspecified: Secondary | ICD-10-CM | POA: Diagnosis not present

## 2023-05-12 NOTE — Progress Notes (Signed)
Subjective: Chief Complaint  Patient presents with   Callouses    RM#11 Callus of left foot.     82 year old female presents for evaluation of painful calluses on the both feet.  She does want a proceed with excision of skin lesions after the first of the year if they are causing pain.  No swelling redness or any drainage or any new open lesions that she reports.  No other concerns.   Objective: AAO x3, NAD- wearing regular shoe; friend present  DP/PT pulses palpable bilaterally, CRT less than 3 seconds Hyperkeratotic lesion submetatarsal left foot x 2. There is no significant formation on the right foot.  There is no underlying ulceration drainage or signs of infection.  Seems to be a little bit thicker on the left foot today compared to what it had been previously. No pain with calf compression, swelling, warmth, erythema  Assessment: Distal point of right foot; hyperkeratotic lesions  Plan: -All treatment options discussed with the patient including all alternatives, risks, complications.  -Continue Lyrica for neuropathy- refilled today  -Offloading for the bunion, digital deformities. -Sharply debrided calluses x 2 without any complications or bleeding.  Continue moisturizer, offloading.  -The incision placement as well as the postoperative course was discussed with the patient. I discussed risks of the surgery which include, but not limited to, infection, bleeding, pain, swelling, need for further surgery, delayed or nonhealing, painful or ugly scar, numbness or sensation changes, recurrence, transfer lesions, further deformity, DVT/PE, loss of toe/foot. Patient understands these risks and wishes to proceed with surgery. The surgical consent was reviewed with the patient all 3 pages were signed. No promises or guarantees were given to the outcome of the procedure. All questions were answered to the best of my ability. Before the surgery the patient was encouraged to call the office if  there is any further questions. The surgery will be performed at the [facility] on an outpatient basis.  No follow-ups on file.  Vivi Barrack DPM

## 2023-05-21 ENCOUNTER — Encounter: Payer: Self-pay | Admitting: Podiatry

## 2023-05-21 ENCOUNTER — Ambulatory Visit (INDEPENDENT_AMBULATORY_CARE_PROVIDER_SITE_OTHER): Payer: Medicare Other | Admitting: Podiatry

## 2023-05-21 DIAGNOSIS — L989 Disorder of the skin and subcutaneous tissue, unspecified: Secondary | ICD-10-CM

## 2023-05-28 NOTE — Progress Notes (Signed)
Subjective: Chief Complaint  Patient presents with   Callouses    RM#11 callus shaving on both feet.      82 year old female presents for evaluation of painful calluses on the both feet.  She has the lesions excised the lesion to the back a week.  No recent changes otherwise.  No new concerns.  Objective: AAO x3, NAD- wearing regular shoe; friend present  DP/PT pulses palpable bilaterally, CRT less than 3 seconds Hyperkeratotic lesion submetatarsal left foot x 2. There is no significant formation on the right foot.  There is no underlying ulceration drainage or signs of infection.  Seems to be a little bit thicker on the left foot today compared to what it had been previously.  Overall unchanged No pain with calf compression, swelling, warmth, erythema  Assessment: Distal point of right foot; hyperkeratotic lesions  Plan: -All treatment options discussed with the patient including all alternatives, risks, complications.  -Continue Lyrica for neuropathy- refilled today  -Offloading for the bunion, digital deformities. -Sharply debrided calluses x 2 without any complications or bleeding as a courtesy.  She is scheduled for excision of the skin lesions, only for about a week.  She has no questions or concerns with surgery.  No follow-ups on file.  Vivi Barrack DPM

## 2023-06-07 ENCOUNTER — Encounter: Payer: Self-pay | Admitting: Podiatry

## 2023-06-07 ENCOUNTER — Ambulatory Visit (INDEPENDENT_AMBULATORY_CARE_PROVIDER_SITE_OTHER): Payer: Medicare Other | Admitting: Podiatry

## 2023-06-07 ENCOUNTER — Other Ambulatory Visit: Payer: Self-pay | Admitting: Family Medicine

## 2023-06-07 DIAGNOSIS — G629 Polyneuropathy, unspecified: Secondary | ICD-10-CM

## 2023-06-07 DIAGNOSIS — L989 Disorder of the skin and subcutaneous tissue, unspecified: Secondary | ICD-10-CM

## 2023-06-07 DIAGNOSIS — G8929 Other chronic pain: Secondary | ICD-10-CM

## 2023-06-07 DIAGNOSIS — M159 Polyosteoarthritis, unspecified: Secondary | ICD-10-CM

## 2023-06-07 MED ORDER — HYDROCODONE-ACETAMINOPHEN 5-325 MG PO TABS: 0.5000 | ORAL_TABLET | Freq: Three times a day (TID) | ORAL | 0 refills | Status: DC | PRN

## 2023-06-07 NOTE — Telephone Encounter (Signed)
 Copied from CRM [id number]. Topic: Clinical - Medication Refill >> Jun 07, 2023  3:53 PM Corin V wrote: Most Recent Primary Care Visit:  Provider: WATT HARLENE BROCKS  Department: [facility]  Visit Type: OFFICE VISIT  Date: 04/15/2023  Medication: HYDROcodone -acetaminophen  (NORCO/VICODIN) 5-325 MG tablet  Has the patient contacted their pharmacy? Yes (Agent: If no, request that the patient contact the pharmacy for the refill. If patient does not wish to contact the pharmacy document the reason why and proceed with request.) (Agent: If yes, when and what did the pharmacy advise?)  Is this the correct pharmacy for this prescription? Yes If no, delete pharmacy and type the correct one.  This is the patient's preferred pharmacy:  [facility] [id number], [place] - [address] [place] [address] Phone: [phone] Fax: [phone]   Has the prescription been filled recently? No  Is the patient out of the medication? No  Has the patient been seen for an appointment in the last year OR does the patient have an upcoming appointment? Yes  Can we respond through MyChart? No  Agent: Please be advised that Rx refills may take up to 3 business days. We ask that you follow-up with your pharmacy.

## 2023-06-11 ENCOUNTER — Other Ambulatory Visit: Payer: Self-pay | Admitting: Podiatry

## 2023-06-11 MED ORDER — GABAPENTIN 300 MG PO CAPS: 300.0000 mg | ORAL_CAPSULE | Freq: Three times a day (TID) | ORAL | 3 refills | Status: DC

## 2023-06-13 NOTE — Progress Notes (Signed)
 Subjective: Chief Complaint  Patient presents with   Callouses    RM#14 calluses bilateral painful to walk.     83 year old female presents for evaluation of painful calluses on the both feet.  She is still scheduled for excision of the lesions but she would like open from today prior.  She also has a new lesion on the right foot infection today but suspect the left.  No open lesions.  No recent injury or changes otherwise.    Objective: AAO x3, NAD- wearing regular shoe; friend present  DP/PT pulses palpable bilaterally, CRT less than 3 seconds Hyperkeratotic lesion submetatarsal left foot x 2, right foot x 1.  There is no underlying ulceration drainage or signs of infection.  Seems to be a little bit thicker on the left foot today compared to what it had been previously.  Overall unchanged No pain with calf compression, swelling, warmth, erythema  Assessment: Distal point of right foot; hyperkeratotic lesions  Plan: -All treatment options discussed with the patient including all alternatives, risks, complications.  -Continue Lyrica  for neuropathy- refilled today  -Offloading for the bunion, digital deformities. -Sharply debrided calluses x 3 without any complications or bleeding as a courtesy.  She is scheduled for excision of the skin lesions, only for about a week.  She has no questions or concerns with surgery.  No follow-ups on file.  Margaret [name] Fees DPM

## 2023-06-21 ENCOUNTER — Encounter: Payer: Medicare Other | Admitting: Podiatry

## 2023-06-23 ENCOUNTER — Other Ambulatory Visit: Payer: Self-pay | Admitting: Podiatry

## 2023-06-23 ENCOUNTER — Telehealth: Payer: Self-pay

## 2023-06-23 DIAGNOSIS — D2371 Other benign neoplasm of skin of right lower limb, including hip: Secondary | ICD-10-CM | POA: Diagnosis not present

## 2023-06-23 DIAGNOSIS — D0462 Carcinoma in situ of skin of left upper limb, including shoulder: Secondary | ICD-10-CM | POA: Diagnosis not present

## 2023-06-23 DIAGNOSIS — D0472 Carcinoma in situ of skin of left lower limb, including hip: Secondary | ICD-10-CM | POA: Diagnosis not present

## 2023-06-23 DIAGNOSIS — B078 Other viral warts: Secondary | ICD-10-CM | POA: Diagnosis not present

## 2023-06-23 DIAGNOSIS — B079 Viral wart, unspecified: Secondary | ICD-10-CM | POA: Diagnosis not present

## 2023-06-23 DIAGNOSIS — L989 Disorder of the skin and subcutaneous tissue, unspecified: Secondary | ICD-10-CM | POA: Diagnosis not present

## 2023-06-23 MED ORDER — DOXYCYCLINE HYCLATE 100 MG PO TABS: 100.0000 mg | ORAL_TABLET | Freq: Two times a day (BID) | ORAL | 0 refills | Status: DC

## 2023-06-23 NOTE — Progress Notes (Signed)
Post-op antibiotic sent, has pain rx at home

## 2023-06-23 NOTE — Telephone Encounter (Signed)
Called patient to schedule pessary change. Patient had surgery today and said she would call back tomorrow to schedule once she's rested.

## 2023-06-25 ENCOUNTER — Telehealth: Payer: Self-pay | Admitting: Podiatry

## 2023-06-25 NOTE — Telephone Encounter (Signed)
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   I will have her follow up with her dermatologist.   She has a follow up scheduled for Monday. She has no further questions/concerns.

## 2023-06-28 ENCOUNTER — Ambulatory Visit (INDEPENDENT_AMBULATORY_CARE_PROVIDER_SITE_OTHER): Payer: Medicare Other | Admitting: Podiatry

## 2023-06-28 DIAGNOSIS — L989 Disorder of the skin and subcutaneous tissue, unspecified: Secondary | ICD-10-CM

## 2023-06-28 DIAGNOSIS — D099 Carcinoma in situ, unspecified: Secondary | ICD-10-CM

## 2023-06-30 NOTE — Progress Notes (Signed)
Subjective: Chief Complaint  Patient presents with   Routine Post Op    RM#13 POV#1 SKIN LESIONS Left foot patient states is doing well.      83 year old female presents for follow-up evaluation undergoing soft tissue mass excision left foot.  States that she has been doing well and her pain is controlled.  She does not report any fevers or chills.  This significant pain.  She still in surgical shoe.  No other concerns.  Objective: AAO x3, NAD- wearing regular shoe; friend present  DP/PT pulses palpable bilaterally, CRT less than 3 seconds On the left foot incisions are well coapted with sutures intact and there is no evidence of dehiscence.  No surrounding erythema, ascending cellulitis.  No drainage or pus.  No fluctuance or crepitation.  No signs of infection. Hyperkeratotic lesion right foot submetatarsal 4.  Appears to be uniform in color. No pain with calf compression, swelling, warmth, erythema  Assessment: Status post soft tissue mass excision  Plan: -All treatment options discussed with the patient including all alternatives, risks, complications.  -Again reviewed the biopsies with her.  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  I do not think any further treatment is warranted at this time but we need to monitor for any recurrence or new lesions.  In her history I would like to excise the lesion on the right foot as well and we will plan on doing that once his left foot is healed. She does not want to proceed with that in the office. -Patient doing well.  Small amount of antibiotic was applied followed by dressing.  She will keep dressings clean, dry, intact until follow-up. -Ice/elevation -Surgical shoe -Monitor for any clinical signs or symptoms of infection and directed to call the office immediately should any occur or go to the ER.  No follow-ups on file.  Vivi Barrack DPM

## 2023-07-01 ENCOUNTER — Encounter: Payer: Medicare Other | Admitting: Podiatry

## 2023-07-07 ENCOUNTER — Other Ambulatory Visit: Payer: Self-pay | Admitting: Family Medicine

## 2023-07-07 DIAGNOSIS — E785 Hyperlipidemia, unspecified: Secondary | ICD-10-CM

## 2023-07-08 ENCOUNTER — Encounter: Payer: Self-pay | Admitting: Podiatry

## 2023-07-08 ENCOUNTER — Ambulatory Visit (INDEPENDENT_AMBULATORY_CARE_PROVIDER_SITE_OTHER): Payer: Medicare Other | Admitting: Podiatry

## 2023-07-08 DIAGNOSIS — L989 Disorder of the skin and subcutaneous tissue, unspecified: Secondary | ICD-10-CM

## 2023-07-08 DIAGNOSIS — D099 Carcinoma in situ, unspecified: Secondary | ICD-10-CM

## 2023-07-11 NOTE — Progress Notes (Signed)
 Subjective: Chief Complaint  Patient presents with   Routine Post Op    RM#13 POV #2, DOS 06/23/2023, REMOVAL OF SKIN LESIONS LEFT FOOT-Patient states doing well.    83 year old female presents for follow-up evaluation undergoing soft tissue mass excision left foot.  Presents today for suture removal.  States that she has been doing well.  She is still wearing her surgical shoe.  No fevers or chills.   Objective: AAO x3, NAD- wearing regular shoe; friend present  DP/PT pulses palpable bilaterally, CRT less than 3 seconds On the left foot incisions are well coapted with sutures intact and there is no evidence of dehiscence.  No surrounding erythema, ascending cellulitis there is no drainage or pus or any obvious signs of infection noted today.  There is no erythema or edema. No pain with calf compression, swelling, warmth, erythema  Assessment: Status post soft tissue mass excision  Plan: -All treatment options discussed with the patient including all alternatives, risks, complications.  -I remove the sutures today with any complications.  Incision is well coapted.  I remove the surgical shoe the past couple days and then she does have a gradual transition to regular shoe as tolerated once the incision heals more.  She can wash the soap and water  and apply a similar bandage to them at appointment. -Monitor for any clinical signs or symptoms of infection and directed to call the office immediately should any occur or go to the ER.  No follow-ups on file.  Margaret [name] DPM

## 2023-07-15 ENCOUNTER — Encounter: Payer: Medicare Other | Admitting: Podiatry

## 2023-07-22 ENCOUNTER — Encounter: Payer: Medicare Other | Admitting: Podiatry

## 2023-07-26 ENCOUNTER — Encounter: Payer: Self-pay | Admitting: Podiatry

## 2023-07-26 ENCOUNTER — Ambulatory Visit (INDEPENDENT_AMBULATORY_CARE_PROVIDER_SITE_OTHER): Payer: Medicare Other

## 2023-07-26 ENCOUNTER — Ambulatory Visit (INDEPENDENT_AMBULATORY_CARE_PROVIDER_SITE_OTHER): Payer: Medicare Other | Admitting: Podiatry

## 2023-07-26 DIAGNOSIS — L821 Other seborrheic keratosis: Secondary | ICD-10-CM | POA: Diagnosis not present

## 2023-07-26 DIAGNOSIS — L989 Disorder of the skin and subcutaneous tissue, unspecified: Secondary | ICD-10-CM | POA: Diagnosis not present

## 2023-07-26 DIAGNOSIS — M19072 Primary osteoarthritis, left ankle and foot: Secondary | ICD-10-CM

## 2023-07-26 DIAGNOSIS — D225 Melanocytic nevi of trunk: Secondary | ICD-10-CM | POA: Diagnosis not present

## 2023-07-26 DIAGNOSIS — D2272 Melanocytic nevi of left lower limb, including hip: Secondary | ICD-10-CM | POA: Diagnosis not present

## 2023-07-26 DIAGNOSIS — Z9889 Other specified postprocedural states: Secondary | ICD-10-CM | POA: Diagnosis not present

## 2023-07-26 DIAGNOSIS — D2261 Melanocytic nevi of right upper limb, including shoulder: Secondary | ICD-10-CM | POA: Diagnosis not present

## 2023-07-26 DIAGNOSIS — L820 Inflamed seborrheic keratosis: Secondary | ICD-10-CM | POA: Diagnosis not present

## 2023-07-26 DIAGNOSIS — L578 Other skin changes due to chronic exposure to nonionizing radiation: Secondary | ICD-10-CM | POA: Diagnosis not present

## 2023-07-26 DIAGNOSIS — L814 Other melanin hyperpigmentation: Secondary | ICD-10-CM | POA: Diagnosis not present

## 2023-07-26 DIAGNOSIS — Z85820 Personal history of malignant melanoma of skin: Secondary | ICD-10-CM | POA: Diagnosis not present

## 2023-07-26 NOTE — Progress Notes (Unsigned)
 Subjective: Chief Complaint  Patient presents with   Routine Post Op    RM#11 POV #3, DOS 06/23/2023, REMOVAL OF SKIN LESIONS LEFT FOOT    83 year old female presents for follow-up evaluation undergoing soft tissue mass excision left foot.  States that she has been doing well with the left foot.  She has a proceed with having soft tissue mass excision on the right foot at the end of March.    Objective: AAO x3, NAD- wearing regular shoe; friend present  DP/PT pulses palpable bilaterally, CRT less than 3 seconds On the left foot incisions are well coapted and small scab is still present but there is no evidence of dehiscence.  There is no surrounding erythema, ascending cellulitis.  No fluctuation or crepitation there is no malodor.  No signs of infection present. Right foot: Soft tissue lesion submetatarsal area x 1 today.  There is no edema, erythema or signs of infection.  No open lesions are identified. No pain with calf compression, swelling, warmth, erythema  Assessment: Status post soft tissue mass excision  Plan: -X-rays obtained reviewed of the left foot.  Surgery was on first metatarsocuneiform joint broken hardware which has been present.  No evidence of acute fracture.  Arthritic changes present.  Left foot -Incisions are healing well.  She is back in regular shoe gear.  Recommend smaller moisturizer at nighttime.  Monitoring signs or symptoms of infection or reoccurrence. -Continue to follow-up with dermatology for regular skin checks. She was just seen.   Right foot  -She would like to have the skin lesion on her right foot removed given her history of skin cancer in the left side she elected proceed with excision of this lesion as well.  We discussed the surgery as well as postoperative course. -The incision placement as well as the postoperative course was discussed with the patient. I discussed risks of the surgery which include, but not limited to, infection, bleeding,  pain, swelling, need for further surgery, delayed or nonhealing, painful or ugly scar, numbness or sensation changes, over/under correction, recurrence, transfer lesions, further deformity,  DVT/PE, loss of toe/foot. Patient understands these risks and wishes to proceed with surgery. The surgical consent was reviewed with the patient all 3 pages were signed. No promises or guarantees were given to the outcome of the procedure. All questions were answered to the best of my ability. Before the surgery the patient was encouraged to call the office if there is any further questions. The surgery will be performed at the [facility] on an outpatient basis.  Vivi Barrack DPM

## 2023-07-26 NOTE — Patient Instructions (Signed)

## 2023-08-03 NOTE — Patient Instructions (Addendum)
 Recommend dose of RSV at your pharmacy and covid booster if none in the last 6 months or so  I will be in touch with your labs asap Ok to take a hydrocodone twice daily for arthritis pain You can also take additional acetaminophen if needed for pain- max total daily dosage 4000 mg from all sources!   Ok to take 4 of the gabapentin 300 mg daily- take the extra dose in the evening or at bedtime for neuropathy pain  Let me know if you need this refilled by me

## 2023-08-03 NOTE — Progress Notes (Signed)
  [facility] [address], [address] [place], [place] [address] [phone]  Date:  08/09/2023   Name:  Margaret [name]   DOB:  Oct 30, 1940   MRN:  [id number]  PCP:  Pearline Cables, MD    Chief Complaint: Arthritis (Concerns/ questions: 1. Neuropathy 2. Pna vaccine utd?)   History of Present Illness:  Margaret [name] is a 83 y.o. 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  I gave her a prescription for hydrocodone back in September, she is taking this as well as cyclobenzaprine, additional acetaminophen, topical Voltaren.  She is getting some relief from this regimen. Advised possibility of habit formation with hydrocodone.  Encouraged her to take as little as she can but is much as as needed to maintain reasonable comfort and quality of life.  We discussed the possibility of hip replacement surgery-she would like to think about this.  She is not interested in back surgery  Pt notes her arthritis is "really out of control" She does not think voltaren helps her Heating pad works for a while We tried hydrocodone as above- she may take this once or twice daily.  It does help when she takes it twice a day She is really stiff and bent over in the mornings  Her back and both hips, right knee, B hands and wrists X-ray done 11/23; IMPRESSION: 1. 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 2. Lower lumbar spondylosis. 3. Calcified left eccentric uterine fibroid. 4. Prominence of stool in  the pelvic colon, cannot exclude constipation.  She fell on Friday- she tripped  - did not get hurt  She is on gabapentin 300 TID per DR Ardelle Anton for her neuropathy pain in her feet- has been taken this for 3 months At night is the worst ; we discussed having her increase to 300 mg 4 times a day or taking 600 mg at night.  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   Pain in right knee 08/03/2019   Fluid collection at surgical site 10/20/2018   Encounter for postoperative examination after surgery for malignant neoplasm 09/30/2018   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/2018   Plantar fasciitis of right foot 02/03/2017   Porokeratosis 02/03/2017   Acute gallstone pancreatitis    Calculus of gallbladder with acute cholecystitis and obstruction    LFT elevation    Stress fracture 01/01/2016   Insomnia 12/16/2015   Peripheral neuropathy 10/18/2015   Renal insufficiency 03/06/2015   HTN (hypertension) 03/06/2015   Osteopenia 03/06/2015   GERD (gastroesophageal reflux  disease) 03/06/2015   Vaginal pessary present 03/06/2015   Metatarsalgia 07/13/2012   Increased frequency of urination 04/13/2012   DDD (degenerative disc disease), cervical 01/19/2012   Knee stiffness 01/19/2012   Low back pain 01/19/2012   Neck pain 01/19/2012   Pain of hand 01/19/2012   Hallux valgus 09/20/2011   Hyperkeratosis 08/13/2011   Anxiety disorder 07/06/2011   Depression 07/06/2011   Diverticulosis of large intestine  07/06/2011   Non-toxic multinodular goiter 07/06/2011   Atrophic vaginitis 10/24/2010   Dermatographic urticaria 09/17/2010   Inflamed seborrheic keratosis 09/17/2010   Closed fracture of fifth metatarsal bone 07/10/2010   Diagnosis unknown 02/27/2010   Hip pain 06/08/2008   Pulmonary nodule seen on imaging study 06/08/2008    Past Medical History:  Diagnosis Date   Anxiety    Arthritis    Cancer (HCC)    lt. foot melanoma   Chronic kidney disease    lt. kidney cyst   Coronary artery disease    GERD (gastroesophageal reflux disease)    History of healed stress fracture 2013   bilateral feet   Hx of phlebitis    reports remote hx of "superfical blood clots" never anticoagulated   Hypertension    Osteopenia    Pneumonia     Past Surgical History:  Procedure Laterality Date   CATARACT EXTRACTION W/ INTRAOCULAR LENS IMPLANT Bilateral    CHOLECYSTECTOMY N/A 09/19/2016   Procedure: LAPAROSCOPIC CHOLECYSTECTOMY POSSIBLE INTRAOPERATIVE CHOLANGIOGRAM;  Surgeon: Emelia Loron, MD;  Location: [facility] OR;  Service: General;  Laterality: N/A;   ERCP N/A 09/18/2016   Procedure: ENDOSCOPIC RETROGRADE CHOLANGIOPANCREATOGRAPHY (ERCP);  Surgeon: Sherrilyn Rist, MD;  Location: [facility] ENDOSCOPY;  Service: Endoscopy;  Laterality: N/A;   FOOT SURGERY     patient reports four foot surgeries   FOOT SURGERY Left    bunion, hammer toe surgery   FOOT SURGERY Right    shaved bunion, hammer toe correction   HERNIA REPAIR     KNEE ARTHROSCOPY Right 06/02/1995   REPLACEMENT TOTAL KNEE Left    REPLACEMENT TOTAL KNEE Left 10/30/2012   RETINAL DETACHMENT SURGERY     RETINAL DETACHMENT SURGERY Left 06/01/2006   ROBOTIC ASSITED PARTIAL NEPHRECTOMY Left 06/03/2022   Procedure: XI ROBOTIC ASSITED PARTIAL NEPHRECTOMY WITH ULTRASOUND AND VENTRAL HERNIA REPAIR;  Surgeon: Sebastian Ache, MD;  Location: [facility] ORS;  Service: Urology;  Laterality: Left;  3 HRS   THYROIDECTOMY, PARTIAL Right    THYROIDECTOMY,  PARTIAL  06/02/2003   TOTAL KNEE ARTHROPLASTY Right 11/13/2019   Procedure: TOTAL KNEE ARTHROPLASTY SDDC;  Surgeon: Ollen Gross, MD;  Location: [facility] ORS;  Service: Orthopedics;  Laterality: Right;     Social History   Tobacco Use   Smoking status: Former    Current packs/day: 0.00    Average packs/day: 0.3 packs/day for 15.0 years (3.8 ttl pk-yrs)    Types: Cigarettes    Start date: 06/01/1964    Quit date: 06/02/1979    Years since quitting: 44.2   Smokeless tobacco: Never  Vaping Use   Vaping status: Never Used  Substance Use Topics   Alcohol use: Yes    Alcohol/week: 1.0 standard drink of alcohol    Types: 1 Glasses of wine per week    Comment: Occ   Drug use: No    Family History  Problem Relation Age of Onset   Heart disease Mother    Heart attack Mother    Heart disease Father    Diabetes Maternal Grandmother  Cancer Paternal Grandfather        stomach    Allergies  Allergen Reactions   Keflex [Cephalexin] Diarrhea   Percocet [Oxycodone-Acetaminophen] Other (See Comments)    Syncope (passed out)   Clindamycin/Lincomycin Rash    Medication list has been reviewed and updated.  Current Outpatient Medications on File Prior to Visit  Medication Sig Dispense Refill   cyclobenzaprine (FLEXERIL) 10 MG tablet Take 0.5-1 tablets (5-10 mg total) by mouth 2 (two) times daily as needed for muscle spasms. 30 tablet 2   diclofenac Sodium (VOLTAREN) 1 % GEL Apply 2 g topically 4 (four) times daily as needed (pain).     gabapentin (NEURONTIN) 300 MG capsule Take 1 capsule (300 mg total) by mouth 3 (three) times daily. 90 capsule 3   hydrochlorothiazide (HYDRODIURIL) 12.5 MG tablet Take 0.5 tablets (6.25 mg total) by mouth daily as needed. 45 tablet 1   HYDROcodone-acetaminophen (NORCO/VICODIN) 5-325 MG tablet Take 0.5-1 tablets by mouth every 8 (eight) hours as needed. 40 tablet 0   metoprolol succinate (TOPROL-XL) 25 MG 24 hr tablet Take 1 tablet (25 mg total) by mouth  daily. Take with or immediately following a meal 90 tablet 1   NONFORMULARY OR COMPOUNDED ITEM [facility]:  Peripheral Neuropathy - Bupivacaine 1%, Doxepin 3%, Gabapentin 6%, Pentoxifylline 3%, Topiramate 1%. Apply 1-2 grams to affected area 3-4 times daily. (Patient taking differently: Apply 1 application  topically 4 (four) times daily as needed (neuropathy/pain (feet)). [facility]:  Peripheral Neuropathy - Bupivacaine 1%, Doxepin 3%, Gabapentin 6%, Pentoxifylline 3%, Topiramate 1%. Apply 1-2 grams to affected area 3-4 times daily.) 100 each 11   omeprazole (PRILOSEC) 20 MG capsule Take 1 capsule (20 mg total) by mouth daily. 90 capsule 1   pravastatin (PRAVACHOL) 20 MG tablet TAKE 1 TABLET BY MOUTH DAILY 90 tablet 1   No current facility-administered medications on file prior to visit.    Review of Systems:  As per HPI- otherwise negative.   Physical Examination: Vitals:   08/09/23 1429  BP: 130/82  Pulse: 82  Resp: 18  Temp: 98.1 F (36.7 C)  SpO2: 98%   Vitals:   08/09/23 1429  Weight: 169 lb 6.4 oz (76.8 kg)   Body mass index is 32.01 kg/m. Ideal Body Weight:    GEN: no acute distress.  Mildly obese, looks well HEENT: Atraumatic, Normocephalic.  Ears and Nose: No external deformity. CV: RRR, No M/G/R. No JVD. No thrill. No extra heart sounds. PULM: CTA B, no wheezes, crackles, rhonchi. No retractions. No resp. distress. No accessory muscle use. ABD: S, NT, ND, EXTR: No c/c/e PSYCH: Normally interactive. Conversant.    Assessment and Plan: Neuropathy - Plan: B12, Ferritin  Primary hypertension - Plan: CBC, Comprehensive metabolic panel  Bilateral hip pain  DDD (degenerative disc disease), cervical  Primary osteoarthritis of right knee - Plan: Rheumatoid Factor  Other chronic pain - Plan: HYDROcodone-acetaminophen (NORCO/VICODIN) 5-325 MG tablet  Osteoarthritis of multiple joints, unspecified osteoarthritis type - Plan:  HYDROcodone-acetaminophen (NORCO/VICODIN) 5-325 MG tablet  Mild anemia - Plan: Ferritin  Patient seen today for follow-up She notes more significant neuropathy symptoms recently.  I will check a B12 and a ferritin, we also discussed titrating up her gabapentin a bit.  She will let me know how this works for her  Blood pressure is well-controlled, check CBC and CMP  She is taking hydrocodone once or twice a day for severe arthritis pain.  Advised her she can take additional Tylenol during  the day as needed, went over her daily maximum dosage.  Our goal is to keep her comfortable as her surgical options are very limited at this point  Will plan further follow- up pending labs.  Recommend dose of RSV, COVID booster   Signed Abbe Amsterdam, MD  Addendum 3/11, received labs as below.  Message to patient  Results for orders placed or performed in visit on 08/09/23  Rheumatoid Factor   Collection Time: 08/09/23  3:04 PM  Result Value Ref Range   Rheumatoid fact SerPl-aCnc <10 <14 IU/mL  CBC   Collection Time: 08/09/23  3:04 PM  Result Value Ref Range   WBC 6.6 4.0 - 10.5 K/uL   RBC 4.30 3.87 - 5.11 Mil/uL   Platelets 237.0 150.0 - 400.0 K/uL   Hemoglobin 13.8 12.0 - 15.0 g/dL   HCT 16.1 09.6 - 04.5 %   MCV 98.0 78.0 - 100.0 fl   MCHC 32.8 30.0 - 36.0 g/dL   RDW 40.9 81.1 - 91.4 %  Comprehensive metabolic panel   Collection Time: 08/09/23  3:04 PM  Result Value Ref Range   Sodium 141 135 - 145 mEq/L   Potassium 4.1 3.5 - 5.1 mEq/L   Chloride 103 96 - 112 mEq/L   CO2 29 19 - 32 mEq/L   Glucose, Bld 88 70 - 99 mg/dL   BUN 23 6 - 23 mg/dL   Creatinine, Ser 7.82 0.40 - 1.20 mg/dL   Total Bilirubin 0.4 0.2 - 1.2 mg/dL   Alkaline Phosphatase 71 39 - 117 U/L   AST 19 0 - 37 U/L   ALT 12 0 - 35 U/L   Total Protein 6.8 6.0 - 8.3 g/dL   Albumin 4.3 3.5 - 5.2 g/dL   GFR 95.62 (L) >13.08 mL/min   Calcium 9.7 8.4 - 10.5 mg/dL  M57   Collection Time: 08/09/23  3:04 PM  Result  Value Ref Range   Vitamin B-12 419 211 - 911 pg/mL  Ferritin   Collection Time: 08/09/23  3:04 PM  Result Value Ref Range   Ferritin 152.8 10.0 - 291.0 ng/mL

## 2023-08-09 ENCOUNTER — Ambulatory Visit (INDEPENDENT_AMBULATORY_CARE_PROVIDER_SITE_OTHER): Admitting: Family Medicine

## 2023-08-09 VITALS — BP 130/82 | HR 82 | Temp 98.1°F | Resp 18 | Wt 169.4 lb

## 2023-08-09 DIAGNOSIS — M1711 Unilateral primary osteoarthritis, right knee: Secondary | ICD-10-CM | POA: Diagnosis not present

## 2023-08-09 DIAGNOSIS — M5030 Other cervical disc degeneration, unspecified cervical region: Secondary | ICD-10-CM | POA: Diagnosis not present

## 2023-08-09 DIAGNOSIS — M25552 Pain in left hip: Secondary | ICD-10-CM

## 2023-08-09 DIAGNOSIS — D649 Anemia, unspecified: Secondary | ICD-10-CM | POA: Diagnosis not present

## 2023-08-09 DIAGNOSIS — M159 Polyosteoarthritis, unspecified: Secondary | ICD-10-CM

## 2023-08-09 DIAGNOSIS — M25551 Pain in right hip: Secondary | ICD-10-CM

## 2023-08-09 DIAGNOSIS — G629 Polyneuropathy, unspecified: Secondary | ICD-10-CM

## 2023-08-09 DIAGNOSIS — G8929 Other chronic pain: Secondary | ICD-10-CM | POA: Diagnosis not present

## 2023-08-09 DIAGNOSIS — I10 Essential (primary) hypertension: Secondary | ICD-10-CM | POA: Diagnosis not present

## 2023-08-09 MED ORDER — HYDROCODONE-ACETAMINOPHEN 5-325 MG PO TABS: 0.5000 | ORAL_TABLET | Freq: Two times a day (BID) | ORAL | 0 refills | Status: DC | PRN

## 2023-08-10 ENCOUNTER — Encounter: Payer: Self-pay | Admitting: Family Medicine

## 2023-08-10 LAB — CBC
HCT: 42.1 % (ref 36.0–46.0)
Hemoglobin: 13.8 g/dL (ref 12.0–15.0)
MCHC: 32.8 g/dL (ref 30.0–36.0)
MCV: 98 fl (ref 78.0–100.0)
Platelets: 237 10*3/uL (ref 150.0–400.0)
RBC: 4.3 Mil/uL (ref 3.87–5.11)
RDW: 14.6 % (ref 11.5–15.5)
WBC: 6.6 10*3/uL (ref 4.0–10.5)

## 2023-08-10 LAB — COMPREHENSIVE METABOLIC PANEL
ALT: 12 U/L (ref 0–35)
AST: 19 U/L (ref 0–37)
Albumin: 4.3 g/dL (ref 3.5–5.2)
Alkaline Phosphatase: 71 U/L (ref 39–117)
BUN: 23 mg/dL (ref 6–23)
CO2: 29 meq/L (ref 19–32)
Calcium: 9.7 mg/dL (ref 8.4–10.5)
Chloride: 103 meq/L (ref 96–112)
Creatinine, Ser: 1.07 mg/dL (ref 0.40–1.20)
GFR: 48.21 mL/min — ABNORMAL LOW (ref >60.00)
Glucose, Bld: 88 mg/dL (ref 70–99)
Potassium: 4.1 meq/L (ref 3.5–5.1)
Sodium: 141 meq/L (ref 135–145)
Total Bilirubin: 0.4 mg/dL (ref 0.2–1.2)
Total Protein: 6.8 g/dL (ref 6.0–8.3)

## 2023-08-10 LAB — RHEUMATOID FACTOR: Rheumatoid fact SerPl-aCnc: <10 [IU]/mL (ref <14)

## 2023-08-10 LAB — VITAMIN B12: Vitamin B-12: 419 pg/mL (ref 211–911)

## 2023-08-10 LAB — FERRITIN: Ferritin: 152.8 ng/mL (ref 10.0–291.0)

## 2023-08-11 ENCOUNTER — Encounter: Payer: Self-pay | Admitting: Obstetrics & Gynecology

## 2023-08-11 ENCOUNTER — Ambulatory Visit (INDEPENDENT_AMBULATORY_CARE_PROVIDER_SITE_OTHER): Payer: Medicare Other | Admitting: Obstetrics & Gynecology

## 2023-08-11 VITALS — BP 127/66 | HR 65 | Wt 167.0 lb

## 2023-08-11 DIAGNOSIS — Z960 Presence of urogenital implants: Secondary | ICD-10-CM | POA: Diagnosis not present

## 2023-08-11 DIAGNOSIS — Z4689 Encounter for fitting and adjustment of other specified devices: Secondary | ICD-10-CM | POA: Diagnosis not present

## 2023-08-11 DIAGNOSIS — N814 Uterovaginal prolapse, unspecified: Secondary | ICD-10-CM

## 2023-08-11 NOTE — Progress Notes (Signed)
 HPI Pt presents today to have pessary removed and cleaned.  Pt fell 5 days ago and hit her right knee. She reports that she'd been in a lot of pain since that time. She did see her primary care provider. Was able to go shopping and to lunch prior to this visit!  She denies changes with her pessary and says that its been working fine. She does not leak during the day and does not wear pads during the day. She denies further complaints.     Review of Systems: [place]        Objective:  BP 127/66 (BP Location: Right Arm, Patient Position: Sitting, Cuff Size: Large)   Pulse 65   Wt 167 lb (75.8 kg)   BMI 31.55 kg/m     CONSTITUTIONAL: Well-developed, well-nourished female in no acute distress.  HENT:  Normocephalic, atraumatic EYES: Conjunctivae and EOM are normal. No scleral icterus.  NECK: Normal range of motion SKIN: Skin is warm and dry. No rash noted. Not diaphoretic.No pallor. NEUROLGIC: Alert and oriented to person, place, and time. Normal coordination.  GYN: Pessary removed and cleaned; ext gen no issues noted. No excoriations or breakdown of skin noted. I did not look with the speculum today as pt was too uncomfortable.       Diagnoses and all orders for this visit:  Vaginal pessary present  Encounter for fitting and adjustment of pessary  Uterovaginal prolapse  Urinary incontinence- only at night. No further eval desired.         F/u in 3 months to have pessary removed and cleaned.  F/u sooner prn    Clarissia Mckeen L. Harraway-Smith, M.D., [place] [place]

## 2023-08-13 ENCOUNTER — Telehealth: Payer: Self-pay | Admitting: Podiatry

## 2023-08-13 NOTE — Telephone Encounter (Signed)
 Patient called asking for RX refill of cream for neuropathy to be sent ASAP due to it being Friday. Pt uses [facility].

## 2023-08-16 ENCOUNTER — Encounter: Payer: Self-pay | Admitting: Podiatry

## 2023-08-17 ENCOUNTER — Telehealth: Payer: Self-pay | Admitting: Podiatry

## 2023-08-17 NOTE — Telephone Encounter (Signed)
 Pt called and stated she was talking with Dr Ardelle Anton thru my chart and is canceling her surgery on 08/25/23 and will call to r/s.  I have canceled the surgery via email to cynthia at the surgery center and cxled her post op appts. I will write cxl in book when I go down.

## 2023-08-23 ENCOUNTER — Encounter: Payer: Self-pay | Admitting: Family Medicine

## 2023-08-23 DIAGNOSIS — M25551 Pain in right hip: Secondary | ICD-10-CM

## 2023-08-23 DIAGNOSIS — M5440 Lumbago with sciatica, unspecified side: Secondary | ICD-10-CM

## 2023-08-24 NOTE — Addendum Note (Signed)
 Addended by: Pearline Cables on: 08/24/2023 06:33 AM   Modules accepted: Orders

## 2023-08-26 ENCOUNTER — Other Ambulatory Visit: Payer: Self-pay | Admitting: Family Medicine

## 2023-08-26 ENCOUNTER — Encounter: Payer: Self-pay | Admitting: Family Medicine

## 2023-08-26 ENCOUNTER — Ambulatory Visit (HOSPITAL_BASED_OUTPATIENT_CLINIC_OR_DEPARTMENT_OTHER)
Admission: RE | Admit: 2023-08-26 | Discharge: 2023-08-26 | Disposition: A | Discharge status: Home / Self Care | Source: Ambulatory Visit | Attending: Family Medicine | Admitting: Family Medicine

## 2023-08-26 DIAGNOSIS — M25552 Pain in left hip: Secondary | ICD-10-CM | POA: Diagnosis not present

## 2023-08-26 DIAGNOSIS — M5440 Lumbago with sciatica, unspecified side: Secondary | ICD-10-CM

## 2023-08-26 DIAGNOSIS — M25551 Pain in right hip: Secondary | ICD-10-CM

## 2023-08-26 DIAGNOSIS — M47816 Spondylosis without myelopathy or radiculopathy, lumbar region: Secondary | ICD-10-CM | POA: Diagnosis not present

## 2023-08-26 DIAGNOSIS — M51372 Other intervertebral disc degeneration, lumbosacral region with discogenic back pain and lower extremity pain: Secondary | ICD-10-CM | POA: Diagnosis not present

## 2023-08-26 DIAGNOSIS — M160 Bilateral primary osteoarthritis of hip: Secondary | ICD-10-CM | POA: Diagnosis not present

## 2023-08-26 DIAGNOSIS — M4316 Spondylolisthesis, lumbar region: Secondary | ICD-10-CM | POA: Diagnosis not present

## 2023-08-26 DIAGNOSIS — M51362 Other intervertebral disc degeneration, lumbar region with discogenic back pain and lower extremity pain: Secondary | ICD-10-CM | POA: Diagnosis not present

## 2023-08-30 ENCOUNTER — Encounter: Payer: Medicare Other | Admitting: Podiatry

## 2023-09-07 ENCOUNTER — Ambulatory Visit (INDEPENDENT_AMBULATORY_CARE_PROVIDER_SITE_OTHER): Admitting: Podiatry

## 2023-09-07 ENCOUNTER — Encounter: Payer: Self-pay | Admitting: Podiatry

## 2023-09-07 DIAGNOSIS — L989 Disorder of the skin and subcutaneous tissue, unspecified: Secondary | ICD-10-CM

## 2023-09-08 DIAGNOSIS — M5416 Radiculopathy, lumbar region: Secondary | ICD-10-CM | POA: Diagnosis not present

## 2023-09-08 DIAGNOSIS — M51369 Other intervertebral disc degeneration, lumbar region without mention of lumbar back pain or lower extremity pain: Secondary | ICD-10-CM | POA: Diagnosis not present

## 2023-09-09 ENCOUNTER — Encounter: Payer: Medicare Other | Admitting: Podiatry

## 2023-09-10 NOTE — Progress Notes (Signed)
 Subjective: Chief Complaint  Patient presents with   Foot Pain    RM#13 Left foot pain at surgical site.    83 year old female presents for follow-up evaluation undergoing soft tissue mass excision left foot.  She states that she is concerned about the healing.  She has had quite a bit of tenderness in the left foot.  She denies any drainage or pus.  No swelling or redness.  No fevers or chills.  No other concerns today.   Objective: AAO x3, NAD-  DP/PT pulses palpable bilaterally, CRT less than 3 seconds On the left foot submetatarsal on the incision is a thick hyperkeratotic lesion.  Once the debridement of this there is minimal amount of dried blood under the callus but there is no erythema or warmth.  There is no ulcerations present.  There is no surrounding erythema, ascending cellulitis.  There is no fluctuation or crepitation.  No malodor.  No pain with calf compression, swelling, warmth, erythema  Assessment: Status post soft tissue mass excision  Plan:  Left foot -Incisions are healing well.  Incision is healed.  There is thick callus which is sharply debrided today any complications or bleeding.  I dispensed a metatarsal pad.  Upon leaving she states it felt much better.  Discussed moisturizer daily.  Monitoring signs or symptoms of infection or skin breakdown.  Right foot - There is no hyperpigmentation appears to be a callus.  Post excision of this at some point in the future but she like to get over some upcoming family events before this. Continue to monitor.   Vivi Barrack DPM

## 2023-09-13 ENCOUNTER — Other Ambulatory Visit: Payer: Self-pay | Admitting: Family

## 2023-09-13 ENCOUNTER — Ambulatory Visit: Payer: Self-pay

## 2023-09-13 ENCOUNTER — Other Ambulatory Visit: Payer: Self-pay | Admitting: Family Medicine

## 2023-09-13 ENCOUNTER — Encounter: Payer: Self-pay | Admitting: Family Medicine

## 2023-09-13 DIAGNOSIS — M159 Polyosteoarthritis, unspecified: Secondary | ICD-10-CM

## 2023-09-13 DIAGNOSIS — G8929 Other chronic pain: Secondary | ICD-10-CM

## 2023-09-13 MED ORDER — HYDROCODONE-ACETAMINOPHEN 5-325 MG PO TABS: 0.5000 | ORAL_TABLET | Freq: Two times a day (BID) | ORAL | 0 refills | Status: DC | PRN

## 2023-09-13 MED ORDER — SULFAMETHOXAZOLE-TRIMETHOPRIM 800-160 MG PO TABS: 1.0000 | ORAL_TABLET | Freq: Two times a day (BID) | ORAL | 0 refills | Status: DC

## 2023-09-13 NOTE — Telephone Encounter (Signed)
 Copied from CRM [id number]. Topic: Clinical - Medication Refill >> Sep 13, 2023 12:09 PM Artemio Larry wrote: Most Recent Primary Care Visit:  Provider: Kaylee Partridge  Department: [facility]  Visit Type: OFFICE VISIT  Date: 08/09/2023  Medication: HYDROcodone-acetaminophen (NORCO/VICODIN) 5-325 MG tablet [[id number]]  Has the patient contacted their pharmacy? Yes (Agent: If no, request that the patient contact the pharmacy for the refill. If patient does not wish to contact the pharmacy document the reason why and proceed with request.) (Agent: If yes, when and what did the pharmacy advise?)  Is this the correct pharmacy for this prescription? Yes If no, delete pharmacy and type the correct one.  This is the patient's preferred pharmacy:   [facility] [id number] [place] [facility], [place] - [address] [place] [address] Phone: [phone] Fax: [phone]   Has the prescription been filled recently? Yes  Is the patient out of the medication? Yes  Has the patient been seen for an appointment in the last year OR does the patient have an upcoming appointment? Yes  Can we respond through MyChart? Yes  Agent: Please be advised that Rx refills may take up to 3 business days. We ask that you follow-up with your pharmacy.

## 2023-09-13 NOTE — Telephone Encounter (Signed)
 Reason for Triage: Patient says she's having frequent urination and would like an antibiotic sent to her pharmacy on file. She can't come into the office due to her having work done at home. Please call her at [phone]

## 2023-09-13 NOTE — Telephone Encounter (Signed)
  Chief Complaint: urinary frequency Symptoms: urinary frequency Frequency: started early am Pertinent Negatives: Patient denies fever, pain Disposition: [] ED /[] Urgent Care (no appt availability in office) / [] Appointment(In office/virtual)/ []  [place] [facility]/ [] Home Care/ [x] Refused Recommended Disposition /[] [place] [facility]/ []  Follow-up with PCP Additional Notes: Patient states she has urinary frequency- the same as she has had in the past. 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 Patient declines to make appointment for tomorrow- because she does not know when workers will be returning to do the work. I have informed patient of office policy- but will send message for her provider's review. Patient states she uses [facility].   Reason for Disposition  Urinating more frequently than usual (i.e., frequency)  Answer Assessment - Initial Assessment Questions 1. SYMPTOM: "What's the main symptom you're concerned about?" (e.g., frequency, incontinence)     Urinary frequency 2. ONSET: "When did the  symptoms  start?"     During the night- early am 3. PAIN: "Is there any pain?" If Yes, ask: "How bad is it?" (Scale: 1-10; mild, moderate, severe)     no 4. CAUSE: "What do you think is causing the symptoms?"     UTI 5. OTHER SYMPTOMS: "Do you have any other symptoms?" (e.g., blood in urine, fever, flank pain, pain with urination)     no  Protocols used: Urinary Symptoms-A-AH

## 2023-09-13 NOTE — Telephone Encounter (Signed)
 Unsuccessful attempts x3 made: will route info/chart to to PCP office

## 2023-09-14 NOTE — Telephone Encounter (Signed)
 Cancelled reorder and closing encounter due to prescription was sent yesterday.

## 2023-09-15 DIAGNOSIS — H40003 Preglaucoma, unspecified, bilateral: Secondary | ICD-10-CM | POA: Diagnosis not present

## 2023-09-15 NOTE — Telephone Encounter (Signed)
 Patient did pick up medication and is doing better.

## 2023-09-20 ENCOUNTER — Other Ambulatory Visit (HOSPITAL_BASED_OUTPATIENT_CLINIC_OR_DEPARTMENT_OTHER): Payer: Self-pay | Admitting: Family Medicine

## 2023-09-20 ENCOUNTER — Other Ambulatory Visit: Payer: Self-pay

## 2023-09-20 ENCOUNTER — Ambulatory Visit: Attending: Family Medicine

## 2023-09-20 DIAGNOSIS — R293 Abnormal posture: Secondary | ICD-10-CM | POA: Diagnosis not present

## 2023-09-20 DIAGNOSIS — Z1231 Encounter for screening mammogram for malignant neoplasm of breast: Secondary | ICD-10-CM

## 2023-09-20 DIAGNOSIS — M5440 Lumbago with sciatica, unspecified side: Secondary | ICD-10-CM | POA: Diagnosis not present

## 2023-09-20 DIAGNOSIS — M25552 Pain in left hip: Secondary | ICD-10-CM | POA: Diagnosis not present

## 2023-09-20 DIAGNOSIS — M5442 Lumbago with sciatica, left side: Secondary | ICD-10-CM | POA: Diagnosis not present

## 2023-09-20 DIAGNOSIS — R262 Difficulty in walking, not elsewhere classified: Secondary | ICD-10-CM | POA: Diagnosis not present

## 2023-09-20 DIAGNOSIS — M5441 Lumbago with sciatica, right side: Secondary | ICD-10-CM | POA: Insufficient documentation

## 2023-09-20 DIAGNOSIS — M25551 Pain in right hip: Secondary | ICD-10-CM | POA: Insufficient documentation

## 2023-09-20 DIAGNOSIS — G8929 Other chronic pain: Secondary | ICD-10-CM | POA: Insufficient documentation

## 2023-09-20 NOTE — Therapy (Signed)
 OUTPATIENT PHYSICAL THERAPY LOWER EXTREMITY EVALUATION   Patient Name: Margaret [name] MRN: [id number] DOB:Sep 06, 1940, 83 y.o., female Today's Date: 09/20/2023  END OF SESSION:  PT End of Session - 09/20/23 1301     Visit Number 1    Date for PT Re-Evaluation 12/13/23    Progress Note Due on Visit 10    PT Start Time 1301    PT Stop Time 1345    PT Time Calculation (min) 44 min    Activity Tolerance Patient tolerated treatment well    Behavior During Therapy WFL for tasks assessed/performed             Past Medical History:  Diagnosis Date   Anxiety    Arthritis    Cancer (HCC)    lt. foot melanoma   Chronic kidney disease    lt. kidney cyst   Coronary artery disease    GERD (gastroesophageal reflux disease)    History of healed stress fracture 2013   bilateral feet   Hx of phlebitis    reports remote hx of "superfical blood clots" never anticoagulated   Hypertension    Osteopenia    Pneumonia    Past Surgical History:  Procedure Laterality Date   CATARACT EXTRACTION W/ INTRAOCULAR LENS IMPLANT Bilateral    CHOLECYSTECTOMY N/A 09/19/2016   Procedure: LAPAROSCOPIC CHOLECYSTECTOMY POSSIBLE INTRAOPERATIVE CHOLANGIOGRAM;  Surgeon: Enid Harry, MD;  Location: [facility] OR;  Service: General;  Laterality: N/A;   ERCP N/A 09/18/2016   Procedure: ENDOSCOPIC RETROGRADE CHOLANGIOPANCREATOGRAPHY (ERCP);  Surgeon: Albertina Hugger, MD;  Location: [facility] ENDOSCOPY;  Service: Endoscopy;  Laterality: N/A;   FOOT SURGERY     patient reports four foot surgeries   FOOT SURGERY Left    bunion, hammer toe surgery   FOOT SURGERY Right    shaved bunion, hammer toe correction   HERNIA REPAIR     KNEE ARTHROSCOPY Right 06/02/1995   REPLACEMENT TOTAL KNEE Left    REPLACEMENT TOTAL KNEE Left 10/30/2012   RETINAL DETACHMENT SURGERY     RETINAL DETACHMENT SURGERY Left 06/01/2006   ROBOTIC ASSITED PARTIAL NEPHRECTOMY Left 06/03/2022   Procedure: XI ROBOTIC ASSITED PARTIAL NEPHRECTOMY  WITH ULTRASOUND AND VENTRAL HERNIA REPAIR;  Surgeon: Osborn Blaze, MD;  Location: [facility] ORS;  Service: Urology;  Laterality: Left;  3 HRS   THYROIDECTOMY, PARTIAL Right    THYROIDECTOMY, PARTIAL  06/02/2003   TOTAL KNEE ARTHROPLASTY Right 11/13/2019   Procedure: TOTAL KNEE ARTHROPLASTY SDDC;  Surgeon: Liliane Rei, MD;  Location: [facility] ORS;  Service: Orthopedics;  Laterality: Right;    Patient Active Problem List   Diagnosis Date Noted   Renal mass 06/03/2022   Open wound of left side of back 01/20/2021   [place] 11/18/2020   Lipoma of back 11/18/2020   Lymphedema 11/18/2020   Melanocytic nevi of right upper limb, including shoulder 11/18/2020   Personal history of malignant melanoma of skin 11/18/2020   Sebaceous cyst 11/18/2020   Seborrheic dermatitis 11/18/2020   Pre-diabetes 04/03/2020   OA (osteoarthritis) of knee 11/13/2019   Total knee replacement status, 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   Pain in right knee 08/03/2019   Fluid collection at surgical site 10/20/2018   Encounter for postoperative examination after surgery for malignant neoplasm 09/30/2018   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/2018   Plantar fasciitis of right foot 02/03/2017   Porokeratosis 02/03/2017   Acute gallstone pancreatitis    Calculus of gallbladder with acute cholecystitis and obstruction    LFT elevation  Stress fracture 01/01/2016   Insomnia 12/16/2015   Peripheral neuropathy 10/18/2015   Renal insufficiency 03/06/2015   HTN (hypertension) 03/06/2015   Osteopenia 03/06/2015   GERD (gastroesophageal reflux disease) 03/06/2015   Vaginal pessary present 03/06/2015   Metatarsalgia 07/13/2012   Increased frequency of urination 04/13/2012   DDD (degenerative disc disease), cervical 01/19/2012   Knee stiffness 01/19/2012   Low back pain 01/19/2012   Neck pain 01/19/2012   Pain of hand 01/19/2012   Hallux valgus 09/20/2011   Hyperkeratosis 08/13/2011   Anxiety disorder 07/06/2011   Depression  07/06/2011   Diverticulosis of large intestine 07/06/2011   Non-toxic multinodular goiter 07/06/2011   Atrophic vaginitis 10/24/2010   Dermatographic urticaria 09/17/2010   Inflamed seborrheic keratosis 09/17/2010   Closed fracture of fifth metatarsal bone 07/10/2010   Diagnosis unknown 02/27/2010   Hip pain 06/08/2008   Pulmonary nodule seen on imaging study 06/08/2008    PCP: Gates Kasal, MD  REFERRING PROVIDER: Adelaide Adjutant, MD, [facility]  REFERRING DIAG: degeneration of discs of lumbar spine  THERAPY DIAG:  Chronic bilateral low back pain with left-sided sciatica  Chronic bilateral low back pain with right-sided sciatica  Difficulty in walking, not elsewhere classified  Posture imbalance  Rationale for Evaluation and Treatment: Rehabilitation  ONSET DATE: chronic  SUBJECTIVE:   SUBJECTIVE STATEMENT: I hurt pretty much all the time, morning is worst, feel better by end of day  PERTINENT HISTORY: Saw orthopedist, took prednisone, which didn't help, orthopedist may want to do MRI, referred to PT by both PCP and orthopedist, also B degenerative arthritis hips PAIN:  Are you having pain? Yes: NPRS scale: 0 to 5 Pain location: central lower back Lat b hips Pain description: deep ache Aggravating factors: worse after prolonged immobility, sitting, sleeping, standing Relieving factors: gapbapentin is helping, pain med sin am helping  PRECAUTIONS: Fall  RED FLAGS: None   WEIGHT BEARING RESTRICTIONS: No  FALLS:  Has patient fallen in last 6 months? Yes. Number of falls several  LIVING ENVIRONMENT: Lives with: lives with their family and lives with their daughter Lives in: House/apartment Stairs: Yes: Internal: one flight steps; on left going up Has following equipment at home: Environmental consultant - 4 wheeled  OCCUPATION: retired  PLOF: Independent and Independent with basic ADLs  PATIENT GOALS: reduce pain hips and lower back so I can tolerate more activity  NEXT  MD VISIT: not scheduled yet  OBJECTIVE:  Note: Objective measures were completed at Evaluation unless otherwise noted.  DIAGNOSTIC FINDINGS: X-ray done 11/23; IMPRESSION: 1. 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 2. Lower lumbar spondylosis.  PATIENT SURVEYS:  Modified Oswestry Modified Oswestry Low Back Pain Disability Questionnaire: 29 / 50 = 58.0 %   COGNITION: Overall cognitive status: Within functional limits for tasks assessed     SENSATION: Light touch: WFL  EDEMA:  None noted   MUSCLE LENGTH: Hamstrings: Right wfl deg; Left wfl deg Andy Bannister test: Right wfl deg; Left wfl deg  POSTURE: scoliotic posture noted concavity L lumbar, pronounced thoracic kyphosis  PALPATION: Not tender with light palpation B gr trochanters hips  LOWER EXTREMITY ROM:B hip ROM wnl, able to achieve tailor position each leg in sitting  LUMBAR ROM: FB limited 15%, also risk of falling pt needed UE support Lumbar ext, 25% with marked side bending to L with this motion SB B 35% B with central lower back pain LOWER EXTREMITY MMT:  MMT Right eval Left eval  Hip flexion  Hip extension    Hip abduction    Hip adduction    Hip internal rotation    Hip external rotation    Knee flexion    Knee extension    Ankle dorsiflexion    Ankle plantarflexion    Ankle inversion    Ankle eversion     (Blank rows = not tested)   FUNCTIONAL TESTS:  5 times sit to stand: 25 sec, required mod assist of PT to avoid falling backwards into chair with final attempt  GAIT: Distance walked: in clinic up to 60  Assistive device utilized: Single point cane, Walker - 2 wheeled, and none Level of assistance: Min A without device and with cane Comments: very unstable without device                                                                                                                                 TREATMENT DATE: 09/20/23:   Evaluation, explained to pt  that long term management of arthritic changes in hips in spine, should respond well to moderate exercise. Practiced gait with st cane, pt without any marked improvement in her stability with the use of the cane.  Therefore practiced with front wheeled walker, marked improvement in gait pattern . Recommended that she use a front wheeled walker which is more light weight and easier to maneuver in and out of car for outings, she is a high fall risk.  PATIENT EDUCATION:  Education details: POC, goals Person educated: Patient Education method: Explanation, Demonstration, Tactile cues, and Verbal cues Education comprehension: verbalized understanding and returned demonstration  HOME EXERCISE PROGRAM: Green t band hip abd/ER in sitting and green t band ankle pumps L .   ASSESSMENT:  CLINICAL IMPRESSION: Patient is a 83 y.o. female who was evaluated today by physical therapy due to chronic central lower back pain as well as B hip pain.  One large concern is that she has experienced several falls in the last 6 months, today required min / HHA for safe gait in the clinic, does have a rollator that she uses at home but does not use when out due to weight of the rollator loading in and out of car.  Recommended standard front wheel walker for community outings.  Presents with postural changes in lumbar spine and gross B LE weakness .  Will benefit from skilled physical therapy to address her deficits and improve her pain, safety, functional mobility. OBJECTIVE IMPAIRMENTS: decreased activity tolerance, decreased balance, decreased knowledge of condition, decreased knowledge of use of DME, decreased mobility, difficulty walking, decreased ROM, decreased strength, decreased safety awareness, impaired perceived functional ability, postural dysfunction, and pain.   ACTIVITY LIMITATIONS: carrying, lifting, bending, standing, squatting, transfers, bed mobility, and locomotion level  PARTICIPATION LIMITATIONS: meal  prep, cleaning, laundry, shopping, community activity, and yard work  PERSONAL FACTORS: Age, Behavior pattern, Fitness, Past/current experiences, Time since onset of injury/illness/exacerbation, and 1-2 comorbidities: severe DJD  B hips, lumbar region, neuropathy  are also affecting patient's functional outcome.   REHAB POTENTIAL: Good  CLINICAL DECISION MAKING: Evolving/moderate complexity  EVALUATION COMPLEXITY: Moderate   GOALS: Goals reviewed with patient? Yes  SHORT TERM GOALS: Target date: 2 weeks 5 5/25 I HEP Baseline: Goal status: INITIAL   LONG TERM GOALS: Target date: 12 weeks, 12/13/23  Modified Oswestry Low Back Pain Disability Questionnaire: 29 / 50 = 58.0 %  Baseline:  Goal status: INITIAL  2.  Safe gait with appropriate device , over 350' for safe household distances, without balance loss Baseline:  Goal status: INITIAL  3.  5 x sit to stand 14.8 sec or less without balance loss Baseline: 25 sec,with UE support B  required mod assist on final rep to avoid fall  Goal status: INITIAL    PLAN:  PT FREQUENCY: 2x/week  PT DURATION: 12 weeks  PLANNED INTERVENTIONS: 97110-Therapeutic exercises, 97530- Therapeutic activity, [id number]- Neuromuscular re-education, 97535- Self Care, 40981- Manual therapy, G0283- Electrical stimulation (unattended), and 97033- Ionotophoresis 4mg /ml Dexamethasone   PLAN FOR NEXT SESSION: initiate cardiovascular equipment, light strengthening for post chain LE musculature B    Katoya Amato L Tewana Bohlen, PT, DPT, OCS 09/20/2023, 5:54 PM

## 2023-09-23 ENCOUNTER — Other Ambulatory Visit: Payer: Self-pay | Admitting: Family Medicine

## 2023-09-23 ENCOUNTER — Other Ambulatory Visit: Payer: Self-pay | Admitting: Podiatry

## 2023-09-23 ENCOUNTER — Encounter: Payer: Medicare Other | Admitting: Podiatry

## 2023-09-23 DIAGNOSIS — K219 Gastro-esophageal reflux disease without esophagitis: Secondary | ICD-10-CM

## 2023-09-27 ENCOUNTER — Encounter: Payer: Self-pay | Admitting: Podiatry

## 2023-09-27 ENCOUNTER — Ambulatory Visit (INDEPENDENT_AMBULATORY_CARE_PROVIDER_SITE_OTHER): Admitting: Podiatry

## 2023-09-27 DIAGNOSIS — G629 Polyneuropathy, unspecified: Secondary | ICD-10-CM

## 2023-09-27 DIAGNOSIS — L989 Disorder of the skin and subcutaneous tissue, unspecified: Secondary | ICD-10-CM | POA: Diagnosis not present

## 2023-09-27 NOTE — Addendum Note (Signed)
 Addended byKate Pak, Joua Bake L on: 09/27/2023 04:44 PM   Modules accepted: Orders

## 2023-09-29 NOTE — Progress Notes (Signed)
 Subjective: Chief Complaint  Patient presents with   SKIN LESION    RM#14 Left foot follow up skin lesion      83 year old female presents for follow-up evaluation undergoing soft tissue mass excision left foot.  She states after she had a callus last appointment she felt good for 2 weeks but the pain started coming back.  She does not report any recent injury or change otherwise.    Objective: AAO x3, NAD DP/PT pulses palpable bilaterally, CRT less than 3 seconds On the left foot submetatarsal on the incision is a hyperkeratotic lesion.  Once the debridement of this there is no underlying ulceration, drainage or signs of infection.  There is no new skin lesions identified in the left side.   On the right hyperkeratotic lesion which is uniform in color.  There is no underlying ulceration, drainage or signs of infection.  No pinpoint bleeding.   Chronic deformity noted of the right third toe. No pain with calf compression, swelling, warmth, erythema  Assessment: Status post soft tissue mass excision; neuropathy  Plan:  Left foot -Incisions are healed well with a hyperkeratotic lesion which I debrided without any complications or bleeding.  Discussed urea cream to help.  Offloading.  Right foot - There is no hyperpigmentation appears to be a callus.  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  It appears to be uniform but at some point would schedule excision.  Neuropathy - Discussed Qutenza she wants to proceed with this since we have the approval.  Will work on getting this ordered for her.  Return for foot pain in 3-4 weeks and Qutenza .  Charity Conch DPM

## 2023-09-30 ENCOUNTER — Encounter: Payer: Self-pay | Admitting: Podiatry

## 2023-10-01 NOTE — Telephone Encounter (Signed)
Is this accurate?

## 2023-10-04 ENCOUNTER — Ambulatory Visit

## 2023-10-06 ENCOUNTER — Ambulatory Visit: Attending: Family Medicine

## 2023-10-06 ENCOUNTER — Other Ambulatory Visit: Payer: Self-pay

## 2023-10-06 DIAGNOSIS — M5442 Lumbago with sciatica, left side: Secondary | ICD-10-CM | POA: Diagnosis not present

## 2023-10-06 DIAGNOSIS — G8929 Other chronic pain: Secondary | ICD-10-CM | POA: Diagnosis not present

## 2023-10-06 DIAGNOSIS — R293 Abnormal posture: Secondary | ICD-10-CM | POA: Diagnosis not present

## 2023-10-06 DIAGNOSIS — M5441 Lumbago with sciatica, right side: Secondary | ICD-10-CM | POA: Diagnosis not present

## 2023-10-06 DIAGNOSIS — R262 Difficulty in walking, not elsewhere classified: Secondary | ICD-10-CM | POA: Insufficient documentation

## 2023-10-06 NOTE — Therapy (Signed)
 OUTPATIENT PHYSICAL THERAPY LOWER EXTREMITY EVALUATION   Patient Name: Margaret [name] MRN: [id number] DOB:07-Jul-1940, 83 y.o., female Today's Date: 10/06/2023  END OF SESSION:  PT End of Session - 10/06/23 1302     Visit Number 2    Date for PT Re-Evaluation 12/13/23    Progress Note Due on Visit 10    PT Start Time 1300    PT Stop Time 1345    PT Time Calculation (min) 45 min    Activity Tolerance Patient tolerated treatment well    Behavior During Therapy [facility] for tasks assessed/performed;Anxious              Past Medical History:  Diagnosis Date   Anxiety    Arthritis    Cancer (HCC)    lt. foot melanoma   Chronic kidney disease    lt. kidney cyst   Coronary artery disease    GERD (gastroesophageal reflux disease)    History of healed stress fracture 2013   bilateral feet   Hx of phlebitis    reports remote hx of "superfical blood clots" never anticoagulated   Hypertension    Osteopenia    Pneumonia    Past Surgical History:  Procedure Laterality Date   CATARACT EXTRACTION W/ INTRAOCULAR LENS IMPLANT Bilateral    CHOLECYSTECTOMY N/A 09/19/2016   Procedure: LAPAROSCOPIC CHOLECYSTECTOMY POSSIBLE INTRAOPERATIVE CHOLANGIOGRAM;  Surgeon: Enid Harry, MD;  Location: [facility] OR;  Service: General;  Laterality: N/A;   ERCP N/A 09/18/2016   Procedure: ENDOSCOPIC RETROGRADE CHOLANGIOPANCREATOGRAPHY (ERCP);  Surgeon: Albertina Hugger, MD;  Location: [facility] ENDOSCOPY;  Service: Endoscopy;  Laterality: N/A;   FOOT SURGERY     patient reports four foot surgeries   FOOT SURGERY Left    bunion, hammer toe surgery   FOOT SURGERY Right    shaved bunion, hammer toe correction   HERNIA REPAIR     KNEE ARTHROSCOPY Right 06/02/1995   REPLACEMENT TOTAL KNEE Left    REPLACEMENT TOTAL KNEE Left 10/30/2012   RETINAL DETACHMENT SURGERY     RETINAL DETACHMENT SURGERY Left 06/01/2006   ROBOTIC ASSITED PARTIAL NEPHRECTOMY Left 06/03/2022   Procedure: XI ROBOTIC ASSITED PARTIAL  NEPHRECTOMY WITH ULTRASOUND AND VENTRAL HERNIA REPAIR;  Surgeon: Osborn Blaze, MD;  Location: [facility] ORS;  Service: Urology;  Laterality: Left;  3 HRS   THYROIDECTOMY, PARTIAL Right    THYROIDECTOMY, PARTIAL  06/02/2003   TOTAL KNEE ARTHROPLASTY Right 11/13/2019   Procedure: TOTAL KNEE ARTHROPLASTY SDDC;  Surgeon: Liliane Rei, MD;  Location: [facility] ORS;  Service: Orthopedics;  Laterality: Right;    Patient Active Problem List   Diagnosis Date Noted   Renal mass 06/03/2022   Open wound of left side of back 01/20/2021   [place] 11/18/2020   Lipoma of back 11/18/2020   Lymphedema 11/18/2020   Melanocytic nevi of right upper limb, including shoulder 11/18/2020   Personal history of malignant melanoma of skin 11/18/2020   Sebaceous cyst 11/18/2020   Seborrheic dermatitis 11/18/2020   Pre-diabetes 04/03/2020   OA (osteoarthritis) of knee 11/13/2019   Total knee replacement status, 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   Pain in right knee 08/03/2019   Fluid collection at surgical site 10/20/2018   Encounter for postoperative examination after surgery for malignant neoplasm 09/30/2018   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/2018   Plantar fasciitis of right foot 02/03/2017   Porokeratosis 02/03/2017   Acute gallstone pancreatitis    Calculus of gallbladder with acute cholecystitis and obstruction    LFT elevation  Stress fracture 01/01/2016   Insomnia 12/16/2015   Peripheral neuropathy 10/18/2015   Renal insufficiency 03/06/2015   HTN (hypertension) 03/06/2015   Osteopenia 03/06/2015   GERD (gastroesophageal reflux disease) 03/06/2015   Vaginal pessary present 03/06/2015   Metatarsalgia 07/13/2012   Increased frequency of urination 04/13/2012   DDD (degenerative disc disease), cervical 01/19/2012   Knee stiffness 01/19/2012   Low back pain 01/19/2012   Neck pain 01/19/2012   Pain of hand 01/19/2012   Hallux valgus 09/20/2011   Hyperkeratosis 08/13/2011   Anxiety disorder 07/06/2011    Depression 07/06/2011   Diverticulosis of large intestine 07/06/2011   Non-toxic multinodular goiter 07/06/2011   Atrophic vaginitis 10/24/2010   Dermatographic urticaria 09/17/2010   Inflamed seborrheic keratosis 09/17/2010   Closed fracture of fifth metatarsal bone 07/10/2010   Diagnosis unknown 02/27/2010   Hip pain 06/08/2008   Pulmonary nodule seen on imaging study 06/08/2008    PCP: Gates Kasal, MD  REFERRING PROVIDER: Adelaide Adjutant, MD, [facility]  REFERRING DIAG: degeneration of discs of lumbar spine  THERAPY DIAG:  Chronic bilateral low back pain with right-sided sciatica  Chronic bilateral low back pain with left-sided sciatica  Difficulty in walking, not elsewhere classified  Posture imbalance  Rationale for Evaluation and Treatment: Rehabilitation  ONSET DATE: chronic  SUBJECTIVE:   SUBJECTIVE STATEMENT: I hurt really badly in my back and hips, I couldn't get out of bet this morning, was crying in pain. My daughter who lives upstairs had to come and help me  PERTINENT HISTORY: Saw orthopedist, took prednisone, which didn't help, orthopedist may want to do MRI, referred to PT by both PCP and orthopedist, also B degenerative arthritis hips PAIN:  Are you having pain? Yes: NPRS scale: 0 to 5 Pain location: central lower back Lat b hips Pain description: deep ache Aggravating factors: worse after prolonged immobility, sitting, sleeping, standing Relieving factors: gapbapentin is helping, pain med sin am helping  PRECAUTIONS: Fall  RED FLAGS: None   WEIGHT BEARING RESTRICTIONS: No  FALLS:  Has patient fallen in last 6 months? Yes. Number of falls several  LIVING ENVIRONMENT: Lives with: lives with their family and lives with their daughter Lives in: House/apartment Stairs: Yes: Internal: one flight steps; on left going up Has following equipment at home: Environmental consultant - 4 wheeled  OCCUPATION: retired  PLOF: Independent and Independent with basic  ADLs  PATIENT GOALS: reduce pain hips and lower back so I can tolerate more activity  NEXT MD VISIT: not scheduled yet  OBJECTIVE:  Note: Objective measures were completed at Evaluation unless otherwise noted.  DIAGNOSTIC FINDINGS: X-ray done 11/23; IMPRESSION: 1. 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 2. Lower lumbar spondylosis.  PATIENT SURVEYS:  Modified Oswestry Modified Oswestry Low Back Pain Disability Questionnaire: 29 / 50 = 58.0 %   COGNITION: Overall cognitive status: Within functional limits for tasks assessed     SENSATION: Light touch: WFL  EDEMA:  None noted   MUSCLE LENGTH: Hamstrings: Right wfl deg; Left wfl deg Andy Bannister test: Right wfl deg; Left wfl deg  POSTURE: scoliotic posture noted concavity L lumbar, pronounced thoracic kyphosis  PALPATION: Not tender with light palpation B gr trochanters hips  LOWER EXTREMITY ROM:B hip ROM wnl, able to achieve tailor position each leg in sitting  LUMBAR ROM: FB limited 15%, also risk of falling pt needed UE support Lumbar ext, 25% with marked side bending to L with this motion SB B 35% B with central  lower back pain LOWER EXTREMITY MMT:  MMT Right eval Left eval  Hip flexion    Hip extension    Hip abduction    Hip adduction    Hip internal rotation    Hip external rotation    Knee flexion    Knee extension    Ankle dorsiflexion    Ankle plantarflexion    Ankle inversion    Ankle eversion     (Blank rows = not tested)   FUNCTIONAL TESTS:  5 times sit to stand: 25 sec, required mod assist of PT to avoid falling backwards into chair with final attempt  GAIT: Distance walked: in clinic up to 60  Assistive device utilized: Single point cane, Walker - 2 wheeled, and none Level of assistance: Min A without device and with cane Comments: very unstable without device                                                                                                                                  TREATMENT DATE:  10/06/23: Min assist to walk from lobby to clinic, and from clinic to lobby, unsteady antalgic L, flexed posture trunk, hips Nustep, UE's and LE's , level 1 x 6 min  Seated for hamstring curls with yellow t band 15 x each leg Seated hip add isometric with ball 5 sec holds 15 reps Seated rows with yellow t band 15 reps Supine for therapist assisted stretching each hip, flex, ext, rotation gentle  Attempted B knee to chest but too painful.   09/20/23:   Evaluation, explained to pt that long term management of arthritic changes in hips in spine, should respond well to moderate exercise. Practiced gait with st cane, pt without any marked improvement in her stability with the use of the cane.  Therefore practiced with front wheeled walker, marked improvement in gait pattern . Recommended that she use a front wheeled walker which is more light weight and easier to maneuver in and out of car for outings, she is a high fall risk.  PATIENT EDUCATION:  Education details: POC, goals Person educated: Patient Education method: Explanation, Demonstration, Tactile cues, and Verbal cues Education comprehension: verbalized understanding and returned demonstration  HOME EXERCISE PROGRAM: Green t band hip abd/ER in sitting and green t band ankle pumps L .   ASSESSMENT:  CLINICAL IMPRESSION: 10/06/23:  Pt returned today for her first appt following initial evaluation 2 1/2 weeks ago.  Introduced gentle mid range movements LE's and trunk.  She maintains flexed trunk posture in sitting and standing. Did not have a device today so needed min assist to walk in the clinic, uses walker at home.  She spent much of time debating whether she should call the MD again who referred her to go ahead with the MRI.  She did report improved lower back and hip pain by the end of today's Rx.  Will continue with plan of care and  monitor her for worsening Sx.    Eval:Patient is a 83  y.o. female who was evaluated today by physical therapy due to chronic central lower back pain as well as B hip pain.  One large concern is that she has experienced several falls in the last 6 months, today required min / HHA for safe gait in the clinic, does have a rollator that she uses at home but does not use when out due to weight of the rollator loading in and out of car.  Recommended standard front wheel walker for community outings.  Presents with postural changes in lumbar spine and gross B LE weakness .  Will benefit from skilled physical therapy to address her deficits and improve her pain, safety, functional mobility. OBJECTIVE IMPAIRMENTS: decreased activity tolerance, decreased balance, decreased knowledge of condition, decreased knowledge of use of DME, decreased mobility, difficulty walking, decreased ROM, decreased strength, decreased safety awareness, impaired perceived functional ability, postural dysfunction, and pain.   ACTIVITY LIMITATIONS: carrying, lifting, bending, standing, squatting, transfers, bed mobility, and locomotion level  PARTICIPATION LIMITATIONS: meal prep, cleaning, laundry, shopping, community activity, and yard work  PERSONAL FACTORS: Age, Behavior pattern, Fitness, Past/current experiences, Time since onset of injury/illness/exacerbation, and 1-2 comorbidities: severe DJD B hips, lumbar region, neuropathy  are also affecting patient's functional outcome.   REHAB POTENTIAL: Good  CLINICAL DECISION MAKING: Evolving/moderate complexity  EVALUATION COMPLEXITY: Moderate   GOALS: Goals reviewed with patient? Yes  SHORT TERM GOALS: Target date: 2 weeks 5 5/25 I HEP Baseline: Goal status: INITIAL   LONG TERM GOALS: Target date: 12 weeks, 12/13/23  Modified Oswestry Low Back Pain Disability Questionnaire: 29 / 50 = 58.0 %  Baseline:  Goal status: INITIAL  2.  Safe gait with appropriate device , over 350' for safe household distances, without balance  loss Baseline:  Goal status: INITIAL  3.  5 x sit to stand 14.8 sec or less without balance loss Baseline: 25 sec,with UE support B  required mod assist on final rep to avoid fall  Goal status: INITIAL    PLAN:  PT FREQUENCY: 2x/week  PT DURATION: 12 weeks  PLANNED INTERVENTIONS: 97110-Therapeutic exercises, 97530- Therapeutic activity, [id number]- Neuromuscular re-education, 97535- Self Care, 29562- Manual therapy, G0283- Electrical stimulation (unattended), and 97033- Ionotophoresis 4mg /ml Dexamethasone   PLAN FOR NEXT SESSION: cardiovascular equipment, light strengthening for post chain LE musculature B    Telia Amundson L Keliyah Spillman, PT, DPT, OCS 10/06/2023, 2:54 PM

## 2023-10-08 ENCOUNTER — Other Ambulatory Visit: Payer: Self-pay | Admitting: Physical Medicine and Rehabilitation

## 2023-10-08 DIAGNOSIS — M5459 Other low back pain: Secondary | ICD-10-CM

## 2023-10-11 ENCOUNTER — Ambulatory Visit (INDEPENDENT_AMBULATORY_CARE_PROVIDER_SITE_OTHER): Admitting: Family Medicine

## 2023-10-11 ENCOUNTER — Encounter: Payer: Self-pay | Admitting: Family Medicine

## 2023-10-11 VITALS — BP 146/59 | HR 60 | Ht 61.0 in | Wt 169.0 lb

## 2023-10-11 DIAGNOSIS — R350 Frequency of micturition: Secondary | ICD-10-CM

## 2023-10-11 DIAGNOSIS — I10 Essential (primary) hypertension: Secondary | ICD-10-CM | POA: Diagnosis not present

## 2023-10-11 LAB — POC URINALSYSI DIPSTICK (AUTOMATED)
Bilirubin, UA: NEGATIVE
Blood, UA: NEGATIVE
Glucose, UA: NEGATIVE
Ketones, UA: NEGATIVE
Leukocytes, UA: NEGATIVE
Nitrite, UA: NEGATIVE
Protein, UA: NEGATIVE
Spec Grav, UA: 1.01 (ref 1.010–1.025)
Urobilinogen, UA: 0.2 U/dL
pH, UA: 6 (ref 5.0–8.0)

## 2023-10-11 NOTE — Progress Notes (Signed)
 Acute Office Visit  Subjective:     Patient ID: Margaret [name], female    DOB: 11-16-1940, 83 y.o.   MRN: [id number]  Chief Complaint  Patient presents with   Urinary Tract Infection     Patient is in today for urinary frequency.   Discussed the use of AI scribe software for clinical note transcription with the patient, who gave verbal consent to proceed.  History of Present Illness Margaret [name] is an 83 year old female with a pessary who presents with urinary frequency.  She has been experiencing urinary frequency for the past two days, needing to urinate every two hours. No burning sensation, hematuria, bladder pain, or flank pain. No fever or chills. Her urine appears clear.  She is concerned that her pessary might be contributing to her symptoms. She describes a sensation of something 'falling out' and then going back up, which she associates with feeling like she has a urinary tract infection. She plans to discuss this with her gynecologist, whom she sees every 3 months, with the next appointment due in June.  Her blood pressure was elevated today, possibly due to stress, and she plans to monitor it at home.           ROS All review of systems negative except what is listed in the HPI      Objective:    BP (!) 146/59   Pulse 60   Ht 5\' 1"  (1.549 m)   Wt 169 lb (76.7 kg)   SpO2 100%   BMI 31.93 kg/m    Physical Exam Vitals reviewed.  Constitutional:      Appearance: Normal appearance.  Cardiovascular:     Rate and Rhythm: Normal rate and regular rhythm.  Pulmonary:     Effort: Pulmonary effort is normal.     Breath sounds: Normal breath sounds.  Abdominal:     General: Bowel sounds are normal. There is no distension.     Palpations: Abdomen is soft.     Tenderness: There is no abdominal tenderness. There is no guarding.  Skin:    General: Skin is warm and dry.  Neurological:     Mental Status: She is alert and oriented to person,  place, and time.  Psychiatric:        Mood and Affect: Mood normal.        Behavior: Behavior normal.        Thought Content: Thought content normal.        Judgment: Judgment normal.       Results for orders placed or performed in visit on 10/11/23  POCT Urinalysis Dipstick (Automated)  Result Value Ref Range   Color, UA yellow    Clarity, UA clear    Glucose, UA Negative Negative   Bilirubin, UA negative    Ketones, UA negative    Spec Grav, UA 1.010 1.010 - 1.025   Blood, UA negative    pH, UA 6.0 5.0 - 8.0   Protein, UA Negative Negative   Urobilinogen, UA 0.2 0.2 or 1.0 E.U./dL   Nitrite, UA negative    Leukocytes, UA Negative Negative        Assessment & Plan:   Problem List Items Addressed This Visit       Active Problems   HTN (hypertension)   Other Visit Diagnoses       Urinary frequency    -  Primary   Relevant Orders   POCT Urinalysis Dipstick (Automated) (Completed)  Assessment & Plan Urinary frequency Urinary frequency with normal urinalysis.  - Schedule gynecologist appointment for pessary evaluation. - Return if urgency, dysuria, hematuria, or fever develop. - Consider pelvic floor exercises.  Elevated blood pressure Elevated blood pressure likely stress-related, no immediate intervention needed. - Monitor blood pressure at home and record readings. - Contact office if systolic remains above 140 mmHg.      No orders of the defined types were placed in this encounter.   Return if symptoms worsen or fail to improve.  Everlina Hock, NP

## 2023-10-12 ENCOUNTER — Ambulatory Visit: Admitting: Physical Therapy

## 2023-10-12 DIAGNOSIS — R262 Difficulty in walking, not elsewhere classified: Secondary | ICD-10-CM

## 2023-10-12 DIAGNOSIS — G8929 Other chronic pain: Secondary | ICD-10-CM | POA: Diagnosis not present

## 2023-10-12 DIAGNOSIS — M5442 Lumbago with sciatica, left side: Secondary | ICD-10-CM | POA: Diagnosis not present

## 2023-10-12 DIAGNOSIS — R293 Abnormal posture: Secondary | ICD-10-CM

## 2023-10-12 DIAGNOSIS — M5441 Lumbago with sciatica, right side: Secondary | ICD-10-CM | POA: Diagnosis not present

## 2023-10-12 NOTE — Therapy (Signed)
 OUTPATIENT PHYSICAL THERAPY LOWER EXTREMITY   Patient Name: Margaret [name] MRN: [id number] DOB:07-25-1940, 83 y.o., female Today's Date: 10/12/2023  END OF SESSION:  PT End of Session - 10/12/23 1316     Visit Number 3    Date for PT Re-Evaluation 12/13/23    PT Start Time 1315    PT Stop Time 1400    PT Time Calculation (min) 45 min              Past Medical History:  Diagnosis Date   Anxiety    Arthritis    Cancer (HCC)    lt. foot melanoma   Chronic kidney disease    lt. kidney cyst   Coronary artery disease    GERD (gastroesophageal reflux disease)    History of healed stress fracture 2013   bilateral feet   Hx of phlebitis    reports remote hx of "superfical blood clots" never anticoagulated   Hypertension    Osteopenia    Pneumonia    Past Surgical History:  Procedure Laterality Date   CATARACT EXTRACTION W/ INTRAOCULAR LENS IMPLANT Bilateral    CHOLECYSTECTOMY N/A 09/19/2016   Procedure: LAPAROSCOPIC CHOLECYSTECTOMY POSSIBLE INTRAOPERATIVE CHOLANGIOGRAM;  Surgeon: Enid Harry, MD;  Location: [facility] OR;  Service: General;  Laterality: N/A;   ERCP N/A 09/18/2016   Procedure: ENDOSCOPIC RETROGRADE CHOLANGIOPANCREATOGRAPHY (ERCP);  Surgeon: Albertina Hugger, MD;  Location: [facility] ENDOSCOPY;  Service: Endoscopy;  Laterality: N/A;   FOOT SURGERY     patient reports four foot surgeries   FOOT SURGERY Left    bunion, hammer toe surgery   FOOT SURGERY Right    shaved bunion, hammer toe correction   HERNIA REPAIR     KNEE ARTHROSCOPY Right 06/02/1995   REPLACEMENT TOTAL KNEE Left    REPLACEMENT TOTAL KNEE Left 10/30/2012   RETINAL DETACHMENT SURGERY     RETINAL DETACHMENT SURGERY Left 06/01/2006   ROBOTIC ASSITED PARTIAL NEPHRECTOMY Left 06/03/2022   Procedure: XI ROBOTIC ASSITED PARTIAL NEPHRECTOMY WITH ULTRASOUND AND VENTRAL HERNIA REPAIR;  Surgeon: Osborn Blaze, MD;  Location: [facility] ORS;  Service: Urology;  Laterality: Left;  3 HRS   THYROIDECTOMY,  PARTIAL Right    THYROIDECTOMY, PARTIAL  06/02/2003   TOTAL KNEE ARTHROPLASTY Right 11/13/2019   Procedure: TOTAL KNEE ARTHROPLASTY SDDC;  Surgeon: Liliane Rei, MD;  Location: [facility] ORS;  Service: Orthopedics;  Laterality: Right;    Patient Active Problem List   Diagnosis Date Noted   Renal mass 06/03/2022   Open wound of left side of back 01/20/2021   [place] 11/18/2020   Lipoma of back 11/18/2020   Lymphedema 11/18/2020   Melanocytic nevi of right upper limb, including shoulder 11/18/2020   Personal history of malignant melanoma of skin 11/18/2020   Sebaceous cyst 11/18/2020   Seborrheic dermatitis 11/18/2020   Pre-diabetes 04/03/2020   OA (osteoarthritis) of knee 11/13/2019   Total knee replacement status, 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   Pain in right knee 08/03/2019   Fluid collection at surgical site 10/20/2018   Encounter for postoperative examination after surgery for malignant neoplasm 09/30/2018   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/2018   Plantar fasciitis of right foot 02/03/2017   Porokeratosis 02/03/2017   Acute gallstone pancreatitis    Calculus of gallbladder with acute cholecystitis and obstruction    LFT elevation    Stress fracture 01/01/2016   Insomnia 12/16/2015   Peripheral neuropathy 10/18/2015   Renal insufficiency 03/06/2015   HTN (hypertension) 03/06/2015   Osteopenia 03/06/2015  GERD (gastroesophageal reflux disease) 03/06/2015   Vaginal pessary present 03/06/2015   Metatarsalgia 07/13/2012   Increased frequency of urination 04/13/2012   DDD (degenerative disc disease), cervical 01/19/2012   Knee stiffness 01/19/2012   Low back pain 01/19/2012   Neck pain 01/19/2012   Pain of hand 01/19/2012   Hallux valgus 09/20/2011   Hyperkeratosis 08/13/2011   Anxiety disorder 07/06/2011   Depression 07/06/2011   Diverticulosis of large intestine 07/06/2011   Non-toxic multinodular goiter 07/06/2011   Atrophic vaginitis 10/24/2010   Dermatographic  urticaria 09/17/2010   Inflamed seborrheic keratosis 09/17/2010   Closed fracture of fifth metatarsal bone 07/10/2010   Diagnosis unknown 02/27/2010   Hip pain 06/08/2008   Pulmonary nodule seen on imaging study 06/08/2008    PCP: Gates Kasal, MD  REFERRING PROVIDER: Adelaide Adjutant, MD, [facility]  REFERRING DIAG: degeneration of discs of lumbar spine  THERAPY DIAG:  Chronic bilateral low back pain with right-sided sciatica  Chronic bilateral low back pain with left-sided sciatica  Difficulty in walking, not elsewhere classified  Posture imbalance  Rationale for Evaluation and Treatment: Rehabilitation  ONSET DATE: chronic  SUBJECTIVE:   SUBJECTIVE STATEMENT: still hurting a lot. Awaiting MRI. Min A HHA to walk back to clinic  PERTINENT HISTORY: Saw orthopedist, took prednisone, which didn't help, orthopedist may want to do MRI, referred to PT by both PCP and orthopedist, also B degenerative arthritis hips PAIN:  Are you having pain? Yes: NPRS scale: 0 to 5 Pain location: central lower back Lat b hips Pain description: deep ache Aggravating factors: worse after prolonged immobility, sitting, sleeping, standing Relieving factors: gapbapentin is helping, pain med sin am helping  PRECAUTIONS: Fall  RED FLAGS: None   WEIGHT BEARING RESTRICTIONS: No  FALLS:  Has patient fallen in last 6 months? Yes. Number of falls several  LIVING ENVIRONMENT: Lives with: lives with their family and lives with their daughter Lives in: House/apartment Stairs: Yes: Internal: one flight steps; on left going up Has following equipment at home: Environmental consultant - 4 wheeled  OCCUPATION: retired  PLOF: Independent and Independent with basic ADLs  PATIENT GOALS: reduce pain hips and lower back so I can tolerate more activity  NEXT MD VISIT: not scheduled yet  OBJECTIVE:  Note: Objective measures were completed at Evaluation unless otherwise noted.  DIAGNOSTIC FINDINGS: X-ray done  11/23; IMPRESSION: 1. 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 2. Lower lumbar spondylosis.  PATIENT SURVEYS:  Modified Oswestry Modified Oswestry Low Back Pain Disability Questionnaire: 29 / 50 = 58.0 %   COGNITION: Overall cognitive status: Within functional limits for tasks assessed     SENSATION: Light touch: WFL  EDEMA:  None noted   MUSCLE LENGTH: Hamstrings: Right wfl deg; Left wfl deg Andy Bannister test: Right wfl deg; Left wfl deg  POSTURE: scoliotic posture noted concavity L lumbar, pronounced thoracic kyphosis  PALPATION: Not tender with light palpation B gr trochanters hips  LOWER EXTREMITY ROM:B hip ROM wnl, able to achieve tailor position each leg in sitting  LUMBAR ROM: FB limited 15%, also risk of falling pt needed UE support Lumbar ext, 25% with marked side bending to L with this motion SB B 35% B with central lower back pain LOWER EXTREMITY MMT:  MMT Right eval Left eval  Hip flexion    Hip extension    Hip abduction    Hip adduction    Hip internal rotation    Hip external rotation    Knee  flexion    Knee extension    Ankle dorsiflexion    Ankle plantarflexion    Ankle inversion    Ankle eversion     (Blank rows = not tested)   FUNCTIONAL TESTS:  5 times sit to stand: 25 sec, required mod assist of PT to avoid falling backwards into chair with final attempt  GAIT: Distance walked: in clinic up to 60  Assistive device utilized: Single point cane, Walker - 2 wheeled, and none Level of assistance: Min A without device and with cane Comments: very unstable without device                                                                                                                                 TREATMENT DATE:   10/12/23 2# LE seated LAQ,hip flex, hip abd Red tband row and shld ext 2 sets 10 seated Green tband trunk ext  2 sets 10 Standing lateral hip f=shift 10 x each side Standing 3# trunk  rotation Standing 3 # lateral trunk flexion 10 x Nustep L 1 6 min HHA side stepping 8 feet 2 x each HHA marching fwd and backward 8 feet 2 x     10/06/23: Min assist to walk from lobby to clinic, and from clinic to lobby, unsteady antalgic L, flexed posture trunk, hips Nustep, UE's and LE's , level 1 x 6 min  Seated for hamstring curls with yellow t band 15 x each leg Seated hip add isometric with ball 5 sec holds 15 reps Seated rows with yellow t band 15 reps Supine for therapist assisted stretching each hip, flex, ext, rotation gentle  Attempted B knee to chest but too painful.   09/20/23:   Evaluation, explained to pt that long term management of arthritic changes in hips in spine, should respond well to moderate exercise. Practiced gait with st cane, pt without any marked improvement in her stability with the use of the cane.  Therefore practiced with front wheeled walker, marked improvement in gait pattern . Recommended that she use a front wheeled walker which is more light weight and easier to maneuver in and out of car for outings, she is a high fall risk.  PATIENT EDUCATION:  Education details: POC, goals Person educated: Patient Education method: Explanation, Demonstration, Tactile cues, and Verbal cues Education comprehension: verbalized understanding and returned demonstration  HOME EXERCISE PROGRAM: Green t band hip abd/ER in sitting and green t band ankle pumps L .   ASSESSMENT:  CLINICAL IMPRESSION: pt amb in with friend HHA and then needs PTA to help her get back to gym, she says she using RW at home but outside home relies on others and HHA, did just get a transport chair. Working on general strengthening with cuing needed ,verb and tactile,as she has a significant posterior lean in sitting without back support.    Eval:Patient is a 83 y.o. female who was evaluated today by physical  therapy due to chronic central lower back pain as well as B hip pain.  One large  concern is that she has experienced several falls in the last 6 months, today required min / HHA for safe gait in the clinic, does have a rollator that she uses at home but does not use when out due to weight of the rollator loading in and out of car.  Recommended standard front wheel walker for community outings.  Presents with postural changes in lumbar spine and gross B LE weakness .  Will benefit from skilled physical therapy to address her deficits and improve her pain, safety, functional mobility. OBJECTIVE IMPAIRMENTS: decreased activity tolerance, decreased balance, decreased knowledge of condition, decreased knowledge of use of DME, decreased mobility, difficulty walking, decreased ROM, decreased strength, decreased safety awareness, impaired perceived functional ability, postural dysfunction, and pain.   ACTIVITY LIMITATIONS: carrying, lifting, bending, standing, squatting, transfers, bed mobility, and locomotion level  PARTICIPATION LIMITATIONS: meal prep, cleaning, laundry, shopping, community activity, and yard work  PERSONAL FACTORS: Age, Behavior pattern, Fitness, Past/current experiences, Time since onset of injury/illness/exacerbation, and 1-2 comorbidities: severe DJD B hips, lumbar region, neuropathy are also affecting patient's functional outcome.   REHAB POTENTIAL: Good  CLINICAL DECISION MAKING: Evolving/moderate complexity  EVALUATION COMPLEXITY: Moderate   GOALS: Goals reviewed with patient? Yes  SHORT TERM GOALS: Target date: 2 weeks 5 5/25 I HEP Baseline: Goal status: INITIAL   LONG TERM GOALS: Target date: 12 weeks, 12/13/23  Modified Oswestry Low Back Pain Disability Questionnaire: 29 / 50 = 58.0 %  Baseline:  Goal status: INITIAL  2.  Safe gait with appropriate device , over 350' for safe household distances, without balance loss Baseline:  Goal status: INITIAL  3.  5 x sit to stand 14.8 sec or less without balance loss Baseline: 25 sec,with UE support  B  required mod assist on final rep to avoid fall  Goal status: INITIAL    PLAN:  PT FREQUENCY: 2x/week  PT DURATION: 12 weeks  PLANNED INTERVENTIONS: 97110-Therapeutic exercises, 97530- Therapeutic activity, 97112- Neuromuscular re-education, 97535- Self Care, 16109- Manual therapy, G0283- Electrical stimulation (unattended), and 97033- Ionotophoresis 4mg /ml Dexamethasone   PLAN FOR NEXT SESSION: cardiovascular equipment, light strengthening for post chain LE musculature B    Lue Dubuque,ANGIE, PTA,  10/12/2023, 1:17 PM [facility] at [facility] [address] [facility]. [place], [place], [address] Phone: [phone]   Fax:  [phone]  Patient Details  Name: KESHAWNNA PAUGH MRN: [id number] Date of Birth: 1940/07/23 Referring Provider:  Kaylee Partridge, MD  Encounter Date: 10/12/2023   Aquilla Bayley, PTA 10/12/2023, 1:17 PM  [facility] at [facility] [address] [facility]. [place], [place], [address] Phone: [phone]   Fax:  [phone]

## 2023-10-13 ENCOUNTER — Ambulatory Visit

## 2023-10-18 ENCOUNTER — Ambulatory Visit (HOSPITAL_BASED_OUTPATIENT_CLINIC_OR_DEPARTMENT_OTHER)

## 2023-10-19 ENCOUNTER — Encounter: Payer: Self-pay | Admitting: Family Medicine

## 2023-10-19 ENCOUNTER — Ambulatory Visit: Admitting: Physical Therapy

## 2023-10-19 ENCOUNTER — Ambulatory Visit (INDEPENDENT_AMBULATORY_CARE_PROVIDER_SITE_OTHER): Admitting: Podiatry

## 2023-10-19 DIAGNOSIS — G8929 Other chronic pain: Secondary | ICD-10-CM

## 2023-10-19 DIAGNOSIS — M159 Polyosteoarthritis, unspecified: Secondary | ICD-10-CM

## 2023-10-19 DIAGNOSIS — M5442 Lumbago with sciatica, left side: Secondary | ICD-10-CM | POA: Diagnosis not present

## 2023-10-19 DIAGNOSIS — M25551 Pain in right hip: Secondary | ICD-10-CM

## 2023-10-19 DIAGNOSIS — G629 Polyneuropathy, unspecified: Secondary | ICD-10-CM | POA: Diagnosis not present

## 2023-10-19 DIAGNOSIS — R293 Abnormal posture: Secondary | ICD-10-CM

## 2023-10-19 DIAGNOSIS — L989 Disorder of the skin and subcutaneous tissue, unspecified: Secondary | ICD-10-CM | POA: Diagnosis not present

## 2023-10-19 DIAGNOSIS — M5441 Lumbago with sciatica, right side: Secondary | ICD-10-CM | POA: Diagnosis not present

## 2023-10-19 DIAGNOSIS — M5440 Lumbago with sciatica, unspecified side: Secondary | ICD-10-CM

## 2023-10-19 DIAGNOSIS — R262 Difficulty in walking, not elsewhere classified: Secondary | ICD-10-CM

## 2023-10-19 MED ORDER — HYDROCODONE-ACETAMINOPHEN 7.5-325 MG PO TABS: 1.0000 | ORAL_TABLET | Freq: Three times a day (TID) | ORAL | 0 refills | Status: DC | PRN

## 2023-10-19 NOTE — Therapy (Signed)
 OUTPATIENT PHYSICAL THERAPY LOWER EXTREMITY   Patient Name: Margaret [name] MRN: [id number] DOB:[birth year], 83 y.o., female Today's Date: 10/19/2023  END OF SESSION:  PT End of Session - 10/19/23 1317     Visit Number 4    Date for PT Re-Evaluation 12/13/23    PT Start Time 1315    PT Stop Time 1400    PT Time Calculation (min) 45 min              Past Medical History:  Diagnosis Date   Anxiety    Arthritis    Cancer (HCC)    lt. foot melanoma   Chronic kidney disease    lt. kidney cyst   Coronary artery disease    GERD (gastroesophageal reflux disease)    History of healed stress fracture 2013   bilateral feet   Hx of phlebitis    reports remote hx of "superfical blood clots" never anticoagulated   Hypertension    Osteopenia    Pneumonia    Past Surgical History:  Procedure Laterality Date   CATARACT EXTRACTION W/ INTRAOCULAR LENS IMPLANT Bilateral    CHOLECYSTECTOMY N/A 09/19/2016   Procedure: LAPAROSCOPIC CHOLECYSTECTOMY POSSIBLE INTRAOPERATIVE CHOLANGIOGRAM;  Surgeon: Enid Harry, MD;  Location: [facility] OR;  Service: General;  Laterality: N/A;   ERCP N/A 09/18/2016   Procedure: ENDOSCOPIC RETROGRADE CHOLANGIOPANCREATOGRAPHY (ERCP);  Surgeon: Albertina Hugger, MD;  Location: [facility] ENDOSCOPY;  Service: Endoscopy;  Laterality: N/A;   FOOT SURGERY     patient reports four foot surgeries   FOOT SURGERY Left    bunion, hammer toe surgery   FOOT SURGERY Right    shaved bunion, hammer toe correction   HERNIA REPAIR     KNEE ARTHROSCOPY Right 06/02/1995   REPLACEMENT TOTAL KNEE Left    REPLACEMENT TOTAL KNEE Left 10/30/2012   RETINAL DETACHMENT SURGERY     RETINAL DETACHMENT SURGERY Left 06/01/2006   ROBOTIC ASSITED PARTIAL NEPHRECTOMY Left 06/03/2022   Procedure: XI ROBOTIC ASSITED PARTIAL NEPHRECTOMY WITH ULTRASOUND AND VENTRAL HERNIA REPAIR;  Surgeon: Osborn Blaze, MD;  Location: [facility] ORS;  Service: Urology;  Laterality: Left;  3 HRS   THYROIDECTOMY,  PARTIAL Right    THYROIDECTOMY, PARTIAL  06/02/2003   TOTAL KNEE ARTHROPLASTY Right 11/13/2019   Procedure: TOTAL KNEE ARTHROPLASTY SDDC;  Surgeon: Liliane Rei, MD;  Location: [facility] ORS;  Service: Orthopedics;  Laterality: Right;    Patient Active Problem List   Diagnosis Date Noted   Renal mass 06/03/2022   Open wound of left side of back 01/20/2021   [place] 11/18/2020   Lipoma of back 11/18/2020   Lymphedema 11/18/2020   Melanocytic nevi of right upper limb, including shoulder 11/18/2020   Personal history of malignant melanoma of skin 11/18/2020   Sebaceous cyst 11/18/2020   Seborrheic dermatitis 11/18/2020   Pre-diabetes 04/03/2020   OA (osteoarthritis) of knee 11/13/2019   Total knee replacement status, 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   Pain in right knee 08/03/2019   Fluid collection at surgical site 10/20/2018   Encounter for postoperative examination after surgery for malignant neoplasm 09/30/2018   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/2018   Plantar fasciitis of right foot 02/03/2017   Porokeratosis 02/03/2017   Acute gallstone pancreatitis    Calculus of gallbladder with acute cholecystitis and obstruction    LFT elevation    Stress fracture 01/01/2016   Insomnia 12/16/2015   Peripheral neuropathy 10/18/2015   Renal insufficiency 03/06/2015   HTN (hypertension) 03/06/2015   Osteopenia 03/06/2015  GERD (gastroesophageal reflux disease) 03/06/2015   Vaginal pessary present 03/06/2015   Metatarsalgia 07/13/2012   Increased frequency of urination 04/13/2012   DDD (degenerative disc disease), cervical 01/19/2012   Knee stiffness 01/19/2012   Low back pain 01/19/2012   Neck pain 01/19/2012   Pain of hand 01/19/2012   Hallux valgus 09/20/2011   Hyperkeratosis 08/13/2011   Anxiety disorder 07/06/2011   Depression 07/06/2011   Diverticulosis of large intestine 07/06/2011   Non-toxic multinodular goiter 07/06/2011   Atrophic vaginitis 10/24/2010   Dermatographic  urticaria 09/17/2010   Inflamed seborrheic keratosis 09/17/2010   Closed fracture of fifth metatarsal bone 07/10/2010   Diagnosis unknown 02/27/2010   Hip pain 06/08/2008   Pulmonary nodule seen on imaging study 06/08/2008    PCP: Gates Kasal, MD  REFERRING PROVIDER: Adelaide Adjutant, MD, [facility]  REFERRING DIAG: degeneration of discs of lumbar spine  THERAPY DIAG:  Chronic bilateral low back pain with right-sided sciatica  Chronic bilateral low back pain with left-sided sciatica  Difficulty in walking, not elsewhere classified  Posture imbalance  Rationale for Evaluation and Treatment: Rehabilitation  ONSET DATE: chronic  SUBJECTIVE:   SUBJECTIVE STATEMENT: amb in with 4WW. Pt states she is miserable and can not get MRI scheduled. Pain in back and into left groin  PERTINENT HISTORY: Saw orthopedist, took prednisone, which didn't help, orthopedist may want to do MRI, referred to PT by both PCP and orthopedist, also B degenerative arthritis hips PAIN:  Are you having pain? 10/10  PRECAUTIONS: Fall  RED FLAGS: None   WEIGHT BEARING RESTRICTIONS: No  FALLS:  Has patient fallen in last 6 months? Yes. Number of falls several  LIVING ENVIRONMENT: Lives with: lives with their family and lives with their daughter Lives in: House/apartment Stairs: Yes: Internal: one flight steps; on left going up Has following equipment at home: Environmental consultant - 4 wheeled  OCCUPATION: retired  PLOF: Independent and Independent with basic ADLs  PATIENT GOALS: reduce pain hips and lower back so I can tolerate more activity  NEXT MD VISIT: not scheduled yet  OBJECTIVE:  Note: Objective measures were completed at Evaluation unless otherwise noted.  DIAGNOSTIC FINDINGS: X-ray done 11/23; IMPRESSION: 1. 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 2. Lower lumbar spondylosis.  PATIENT SURVEYS:  Modified Oswestry Modified Oswestry Low Back Pain  Disability Questionnaire: 29 / 50 = 58.0 %   COGNITION: Overall cognitive status: Within functional limits for tasks assessed     SENSATION: Light touch: WFL  EDEMA:  None noted   MUSCLE LENGTH: Hamstrings: Right wfl deg; Left wfl deg Andy Bannister test: Right wfl deg; Left wfl deg  POSTURE: scoliotic posture noted concavity L lumbar, pronounced thoracic kyphosis  PALPATION: Not tender with light palpation B gr trochanters hips  LOWER EXTREMITY ROM:B hip ROM wnl, able to achieve tailor position each leg in sitting  LUMBAR ROM: FB limited 15%, also risk of falling pt needed UE support Lumbar ext, 25% with marked side bending to L with this motion SB B 35% B with central lower back pain LOWER EXTREMITY MMT:  MMT Right eval Left eval  Hip flexion    Hip extension    Hip abduction    Hip adduction    Hip internal rotation    Hip external rotation    Knee flexion    Knee extension    Ankle dorsiflexion    Ankle plantarflexion    Ankle inversion    Ankle eversion     (  Blank rows = not tested)   FUNCTIONAL TESTS:  5 times sit to stand: 25 sec, required mod assist of PT to avoid falling backwards into chair with final attempt  GAIT: Distance walked: in clinic up to 60  Assistive device utilized: Single point cane, Walker - 2 wheeled, and none Level of assistance: Min A without device and with cane Comments: very unstable without device                                                                                                                                 TREATMENT DATE:   10/19/23 Nustep L 5 Mech lumbar traction with MH 15 min static 30# then increase to 35# after 5 min Supine ppt 10 x. Supine modified small bridge 10x Supine clams 10 x manual resistance Supine ADD ball squeeze manual resistance Supine hip flexion Isometric abdominals   10/12/23 2# LE seated LAQ,hip flex, hip abd Red tband row and shld ext 2 sets 10 seated Green tband trunk ext  2  sets 10 Standing lateral hip f=shift 10 x each side Standing 3# trunk rotation Standing 3 # lateral trunk flexion 10 x Nustep L 1 6 min HHA side stepping 8 feet 2 x each HHA marching fwd and backward 8 feet 2 x     10/06/23: Min assist to walk from lobby to clinic, and from clinic to lobby, unsteady antalgic L, flexed posture trunk, hips Nustep, UE's and LE's , level 1 x 6 min  Seated for hamstring curls with yellow t band 15 x each leg Seated hip add isometric with ball 5 sec holds 15 reps Seated rows with yellow t band 15 reps Supine for therapist assisted stretching each hip, flex, ext, rotation gentle  Attempted B knee to chest but too painful.   09/20/23:   Evaluation, explained to pt that long term management of arthritic changes in hips in spine, should respond well to moderate exercise. Practiced gait with st cane, pt without any marked improvement in her stability with the use of the cane.  Therefore practiced with front wheeled walker, marked improvement in gait pattern . Recommended that she use a front wheeled walker which is more light weight and easier to maneuver in and out of car for outings, she is a high fall risk.  PATIENT EDUCATION:  Education details: POC, goals Person educated: Patient Education method: Explanation, Demonstration, Tactile cues, and Verbal cues Education comprehension: verbalized understanding and returned demonstration  HOME EXERCISE PROGRAM: Green t band hip abd/ER in sitting and green t band ankle pumps L .   ASSESSMENT:  CLINICAL IMPRESSION: pt amb in today with 8JX. Pt states pain is 10/10 on LB and into Left groin. Tolerates Nustep well and did some light core strengthening with cuing. Trial of mech traction to see if helps symptoms.    Eval:Patient is a 83 y.o. female who was evaluated today by physical therapy due to  chronic central lower back pain as well as B hip pain.  One large concern is that she has experienced several falls in  the last 6 months, today required min / HHA for safe gait in the clinic, does have a rollator that she uses at home but does not use when out due to weight of the rollator loading in and out of car.  Recommended standard front wheel walker for community outings.  Presents with postural changes in lumbar spine and gross B LE weakness .  Will benefit from skilled physical therapy to address her deficits and improve her pain, safety, functional mobility. OBJECTIVE IMPAIRMENTS: decreased activity tolerance, decreased balance, decreased knowledge of condition, decreased knowledge of use of DME, decreased mobility, difficulty walking, decreased ROM, decreased strength, decreased safety awareness, impaired perceived functional ability, postural dysfunction, and pain.   ACTIVITY LIMITATIONS: carrying, lifting, bending, standing, squatting, transfers, bed mobility, and locomotion level  PARTICIPATION LIMITATIONS: meal prep, cleaning, laundry, shopping, community activity, and yard work  PERSONAL FACTORS: Age, Behavior pattern, Fitness, Past/current experiences, Time since onset of injury/illness/exacerbation, and 1-2 comorbidities: severe DJD B hips, lumbar region, neuropathy are also affecting patient's functional outcome.   REHAB POTENTIAL: Good  CLINICAL DECISION MAKING: Evolving/moderate complexity  EVALUATION COMPLEXITY: Moderate   GOALS: Goals reviewed with patient? Yes  SHORT TERM GOALS: Target date: 2 weeks 5 5/25 I HEP Baseline: Goal status: INITIAL   LONG TERM GOALS: Target date: 12 weeks, 12/13/23  Modified Oswestry Low Back Pain Disability Questionnaire: 29 / 50 = 58.0 %  Baseline:  Goal status: INITIAL  2.  Safe gait with appropriate device , over 350' for safe household distances, without balance loss Baseline:  Goal status: INITIAL  3.  5 x sit to stand 14.8 sec or less without balance loss Baseline: 25 sec,with UE support B  required mod assist on final rep to avoid fall   Goal status: INITIAL    PLAN:  PT FREQUENCY: 2x/week  PT DURATION: 12 weeks  PLANNED INTERVENTIONS: 97110-Therapeutic exercises, 97530- Therapeutic activity, 97112- Neuromuscular re-education, 97535- Self Care, 65784- Manual therapy, G0283- Electrical stimulation (unattended), and 97033- Ionotophoresis 4mg /ml Dexamethasone   PLAN FOR NEXT SESSION: cardiovascular equipment, light strengthening for post chain LE musculature B    Jilene Spohr,ANGIE, PTA,  10/19/2023, 1:18 PM [facility] at [facility] [address] [place], [place], [address] Phone: [phone]   Fax:  [phone]  Patient Details  Name: BRELEE RENK MRN: [id number] Date of Birth: 08-11-1940 Referring Provider:  Kaylee Partridge, MD  Encounter Date: 10/19/2023   Aquilla Bayley, PTA 10/19/2023, 1:18 PM  [facility] [address] [place], [place], [address] Phone: [phone]   Fax:  [phone]  Outpatient Rehabilitation at [facility] [address] [place], [place], [address] Phone: [phone]   Fax:  [phone]

## 2023-10-19 NOTE — Addendum Note (Signed)
 Addended by: Gates Kasal C on: 10/19/2023 04:31 PM   Modules accepted: Orders

## 2023-10-20 ENCOUNTER — Ambulatory Visit

## 2023-10-20 ENCOUNTER — Other Ambulatory Visit: Payer: Self-pay | Admitting: Family Medicine

## 2023-10-22 DIAGNOSIS — M5459 Other low back pain: Secondary | ICD-10-CM | POA: Diagnosis not present

## 2023-10-22 DIAGNOSIS — M5416 Radiculopathy, lumbar region: Secondary | ICD-10-CM | POA: Diagnosis not present

## 2023-10-25 NOTE — Progress Notes (Signed)
 Subjective: Chief Complaint  Patient presents with   Peripheral Neuropathy    RM#12 Follow up on neuropathy patient states that last night was a bad night but better today.     83 year old female presents for follow-up evaluation undergoing soft tissue mass excision left foot.  States that it is feeling better.  She been keeping moisturizer on both of the feet.  Does not report any open lesions or any injuries or other concerns today.   Objective: AAO x3, NAD DP/PT pulses palpable bilaterally, CRT less than 3 seconds On the left foot submetatarsal on the incision is a hyperkeratotic lesion although more mild today.  Once I debrided this there is 1 small superficial area of bleeding noted but there is no drainage or pus or any obvious signs of infection.  There is no surrounding erythema, ascending cellulitis.  There is no signs of infection.  On the right foot the hyperkeratotic lesion that was present previously is no longer present.  There is no open lesions or preulcerative calluses on the right foot today.   No pain with calf compression, swelling, warmth, erythema  Assessment: Status post soft tissue mass excision; neuropathy  Plan:  Left foot - Sharply debrided hyperkeratotic lesion with minimal bleeding.  Antibiotic ointment was applied followed by dressing.  Discussed daily dressing changes and monitoring signs or symptoms of infection.  Continue moisturizer, offloading.  Right foot - Lesion appears to have resolved.  Hold off any surgery at this time and monitor for any reoccurrence.  Neuropathy - Acute was covered but she has not met her out-of-pocket yet therefore due to cost we will hold off on this. - Continue gabapentin    Charity Conch DPM

## 2023-10-26 ENCOUNTER — Ambulatory Visit: Admitting: Physical Therapy

## 2023-10-26 DIAGNOSIS — R293 Abnormal posture: Secondary | ICD-10-CM | POA: Diagnosis not present

## 2023-10-26 DIAGNOSIS — M5441 Lumbago with sciatica, right side: Secondary | ICD-10-CM | POA: Diagnosis not present

## 2023-10-26 DIAGNOSIS — R262 Difficulty in walking, not elsewhere classified: Secondary | ICD-10-CM | POA: Diagnosis not present

## 2023-10-26 DIAGNOSIS — M5442 Lumbago with sciatica, left side: Secondary | ICD-10-CM | POA: Diagnosis not present

## 2023-10-26 DIAGNOSIS — G8929 Other chronic pain: Secondary | ICD-10-CM

## 2023-10-26 NOTE — Therapy (Signed)
 OUTPATIENT PHYSICAL THERAPY LOWER EXTREMITY   Patient Name: Margaret [name] MRN: [id number] DOB:05-Feb-1941, 83 y.o., female Today's Date: 10/26/2023  END OF SESSION:  PT End of Session - 10/26/23 1230     Visit Number 5    Date for PT Re-Evaluation 12/13/23    PT Start Time 1230    PT Stop Time 1315    PT Time Calculation (min) 45 min              Past Medical History:  Diagnosis Date   Anxiety    Arthritis    Cancer (HCC)    lt. foot melanoma   Chronic kidney disease    lt. kidney cyst   Coronary artery disease    GERD (gastroesophageal reflux disease)    History of healed stress fracture 2013   bilateral feet   Hx of phlebitis    reports remote hx of "superfical blood clots" never anticoagulated   Hypertension    Osteopenia    Pneumonia    Past Surgical History:  Procedure Laterality Date   CATARACT EXTRACTION W/ INTRAOCULAR LENS IMPLANT Bilateral    CHOLECYSTECTOMY N/A 09/19/2016   Procedure: LAPAROSCOPIC CHOLECYSTECTOMY POSSIBLE INTRAOPERATIVE CHOLANGIOGRAM;  Surgeon: Enid Harry, MD;  Location: [facility] OR;  Service: General;  Laterality: N/A;   ERCP N/A 09/18/2016   Procedure: ENDOSCOPIC RETROGRADE CHOLANGIOPANCREATOGRAPHY (ERCP);  Surgeon: Albertina Hugger, MD;  Location: [facility] ENDOSCOPY;  Service: Endoscopy;  Laterality: N/A;   FOOT SURGERY     patient reports four foot surgeries   FOOT SURGERY Left    bunion, hammer toe surgery   FOOT SURGERY Right    shaved bunion, hammer toe correction   HERNIA REPAIR     KNEE ARTHROSCOPY Right 06/02/1995   REPLACEMENT TOTAL KNEE Left    REPLACEMENT TOTAL KNEE Left 10/30/2012   RETINAL DETACHMENT SURGERY     RETINAL DETACHMENT SURGERY Left 06/01/2006   ROBOTIC ASSITED PARTIAL NEPHRECTOMY Left 06/03/2022   Procedure: XI ROBOTIC ASSITED PARTIAL NEPHRECTOMY WITH ULTRASOUND AND VENTRAL HERNIA REPAIR;  Surgeon: Osborn Blaze, MD;  Location: [facility] ORS;  Service: Urology;  Laterality: Left;  3 HRS   THYROIDECTOMY,  PARTIAL Right    THYROIDECTOMY, PARTIAL  06/02/2003   TOTAL KNEE ARTHROPLASTY Right 11/13/2019   Procedure: TOTAL KNEE ARTHROPLASTY SDDC;  Surgeon: Liliane Rei, MD;  Location: [facility] ORS;  Service: Orthopedics;  Laterality: Right;    Patient Active Problem List   Diagnosis Date Noted   Renal mass 06/03/2022   Open wound of left side of back 01/20/2021   [place] 11/18/2020   Lipoma of back 11/18/2020   Lymphedema 11/18/2020   Melanocytic nevi of right upper limb, including shoulder 11/18/2020   Personal history of malignant melanoma of skin 11/18/2020   Sebaceous cyst 11/18/2020   Seborrheic dermatitis 11/18/2020   Pre-diabetes 04/03/2020   OA (osteoarthritis) of knee 11/13/2019   Total knee replacement status, 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   Pain in right knee 08/03/2019   Fluid collection at surgical site 10/20/2018   Encounter for postoperative examination after surgery for malignant neoplasm 09/30/2018   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/2018   Plantar fasciitis of right foot 02/03/2017   Porokeratosis 02/03/2017   Acute gallstone pancreatitis    Calculus of gallbladder with acute cholecystitis and obstruction    LFT elevation    Stress fracture 01/01/2016   Insomnia 12/16/2015   Peripheral neuropathy 10/18/2015   Renal insufficiency 03/06/2015   HTN (hypertension) 03/06/2015   Osteopenia 03/06/2015  GERD (gastroesophageal reflux disease) 03/06/2015   Vaginal pessary present 03/06/2015   Metatarsalgia 07/13/2012   Increased frequency of urination 04/13/2012   DDD (degenerative disc disease), cervical 01/19/2012   Knee stiffness 01/19/2012   Low back pain 01/19/2012   Neck pain 01/19/2012   Pain of hand 01/19/2012   Hallux valgus 09/20/2011   Hyperkeratosis 08/13/2011   Anxiety disorder 07/06/2011   Depression 07/06/2011   Diverticulosis of large intestine 07/06/2011   Non-toxic multinodular goiter 07/06/2011   Atrophic vaginitis 10/24/2010   Dermatographic  urticaria 09/17/2010   Inflamed seborrheic keratosis 09/17/2010   Closed fracture of fifth metatarsal bone 07/10/2010   Diagnosis unknown 02/27/2010   Hip pain 06/08/2008   Pulmonary nodule seen on imaging study 06/08/2008    PCP: Gates Kasal, MD  REFERRING PROVIDER: Adelaide Adjutant, MD, [facility]  REFERRING DIAG: degeneration of discs of lumbar spine  THERAPY DIAG:  Chronic bilateral low back pain with right-sided sciatica  Chronic bilateral low back pain with left-sided sciatica  Difficulty in walking, not elsewhere classified  Posture imbalance  Rationale for Evaluation and Treatment: Rehabilitation  ONSET DATE: chronic  SUBJECTIVE:   SUBJECTIVE STATEMENT: MRI Friday no results yet. Back is killing me. MH and traction really helped last time. PERTINENT HISTORY: Saw orthopedist, took prednisone, which didn't help, orthopedist may want to do MRI, referred to PT by both PCP and orthopedist, also B degenerative arthritis hips PAIN:  Are you having pain? 10/10  PRECAUTIONS: Fall  RED FLAGS: None   WEIGHT BEARING RESTRICTIONS: No  FALLS:  Has patient fallen in last 6 months? Yes. Number of falls several  LIVING ENVIRONMENT: Lives with: lives with their family and lives with their daughter Lives in: House/apartment Stairs: Yes: Internal: one flight steps; on left going up Has following equipment at home: Environmental consultant - 4 wheeled  OCCUPATION: retired  PLOF: Independent and Independent with basic ADLs  PATIENT GOALS: reduce pain hips and lower back so I can tolerate more activity  NEXT MD VISIT: not scheduled yet  OBJECTIVE:  Note: Objective measures were completed at Evaluation unless otherwise noted.  DIAGNOSTIC FINDINGS: X-ray done 11/23; IMPRESSION: 1. 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 2. Lower lumbar spondylosis.  PATIENT SURVEYS:  Modified Oswestry Modified Oswestry Low Back Pain Disability Questionnaire: 29  / 50 = 58.0 %   COGNITION: Overall cognitive status: Within functional limits for tasks assessed     SENSATION: Light touch: WFL  EDEMA:  None noted   MUSCLE LENGTH: Hamstrings: Right wfl deg; Left wfl deg Andy Bannister test: Right wfl deg; Left wfl deg  POSTURE: scoliotic posture noted concavity L lumbar, pronounced thoracic kyphosis  PALPATION: Not tender with light palpation B gr trochanters hips  LOWER EXTREMITY ROM:B hip ROM wnl, able to achieve tailor position each leg in sitting  LUMBAR ROM: FB limited 15%, also risk of falling pt needed UE support Lumbar ext, 25% with marked side bending to L with this motion SB B 35% B with central lower back pain LOWER EXTREMITY MMT:  MMT Right eval Left eval  Hip flexion    Hip extension    Hip abduction    Hip adduction    Hip internal rotation    Hip external rotation    Knee flexion    Knee extension    Ankle dorsiflexion    Ankle plantarflexion    Ankle inversion    Ankle eversion     (Blank rows = not tested)  FUNCTIONAL TESTS:  5 times sit to stand: 25 sec, required mod assist of PT to avoid falling backwards into chair with final attempt  GAIT: Distance walked: in clinic up to 60  Assistive device utilized: Single point cane, Walker - 2 wheeled, and none Level of assistance: Min A without device and with cane Comments: very unstable without device                                                                                                                                 TREATMENT DATE:   10/26/23 Nustep L 5 8 min Sitting on dyna disc pelvic ROM 4 ways 10 x, LAQ,hip flex and hip abd 10 x each,red tband scap stab shld ext and row Supine feet on ball bridge,KTC,obl and isometric abdominals MEch Lumb traction 40# 15 min with [facility]   10/19/23 Nustep L 5 Mech lumbar traction with MH 15 min static 30# then increase to 35# after 5 min Supine ppt 10 x. Supine modified small bridge 10x Supine clams 10 x  manual resistance Supine ADD ball squeeze manual resistance Supine hip flexion Isometric abdominals   10/12/23 2# LE seated LAQ,hip flex, hip abd Red tband row and shld ext 2 sets 10 seated Green tband trunk ext  2 sets 10 Standing lateral hip f=shift 10 x each side Standing 3# trunk rotation Standing 3 # lateral trunk flexion 10 x Nustep L 1 6 min HHA side stepping 8 feet 2 x each HHA marching fwd and backward 8 feet 2 x     10/06/23: Min assist to walk from lobby to clinic, and from clinic to lobby, unsteady antalgic L, flexed posture trunk, hips Nustep, UE's and LE's , level 1 x 6 min  Seated for hamstring curls with yellow t band 15 x each leg Seated hip add isometric with ball 5 sec holds 15 reps Seated rows with yellow t band 15 reps Supine for therapist assisted stretching each hip, flex, ext, rotation gentle  Attempted B knee to chest but too painful.   09/20/23:   Evaluation, explained to pt that long term management of arthritic changes in hips in spine, should respond well to moderate exercise. Practiced gait with st cane, pt without any marked improvement in her stability with the use of the cane.  Therefore practiced with front wheeled walker, marked improvement in gait pattern . Recommended that she use a front wheeled walker which is more light weight and easier to maneuver in and out of car for outings, she is a high fall risk.  PATIENT EDUCATION:  Education details: POC, goals Person educated: Patient Education method: Explanation, Demonstration, Tactile cues, and Verbal cues Education comprehension: verbalized understanding and returned demonstration  HOME EXERCISE PROGRAM: Green t band hip abd/ER in sitting and green t band ankle pumps L .   ASSESSMENT:  CLINICAL IMPRESSION: pt arrives without AD so needed HHA. C/o intense back pain but does  think she can stand a bit straighter. MRI Friday but no results. Tolerated ex well with cuing today. MH and traction  again as pt states it gave relief last session.   Eval:Patient is a 83 y.o. female who was evaluated today by physical therapy due to chronic central lower back pain as well as B hip pain.  One large concern is that she has experienced several falls in the last 6 months, today required min / HHA for safe gait in the clinic, does have a rollator that she uses at home but does not use when out due to weight of the rollator loading in and out of car.  Recommended standard front wheel walker for community outings.  Presents with postural changes in lumbar spine and gross B LE weakness .  Will benefit from skilled physical therapy to address her deficits and improve her pain, safety, functional mobility. OBJECTIVE IMPAIRMENTS: decreased activity tolerance, decreased balance, decreased knowledge of condition, decreased knowledge of use of DME, decreased mobility, difficulty walking, decreased ROM, decreased strength, decreased safety awareness, impaired perceived functional ability, postural dysfunction, and pain.   ACTIVITY LIMITATIONS: carrying, lifting, bending, standing, squatting, transfers, bed mobility, and locomotion level  PARTICIPATION LIMITATIONS: meal prep, cleaning, laundry, shopping, community activity, and yard work  PERSONAL FACTORS: Age, Behavior pattern, Fitness, Past/current experiences, Time since onset of injury/illness/exacerbation, and 1-2 comorbidities: severe DJD B hips, lumbar region, neuropathy are also affecting patient's functional outcome.   REHAB POTENTIAL: Good  CLINICAL DECISION MAKING: Evolving/moderate complexity  EVALUATION COMPLEXITY: Moderate   GOALS: Goals reviewed with patient? Yes  SHORT TERM GOALS: Target date: 2 weeks 5 5/25 I HEP Baseline: Goal status: MET 10/26/23   LONG TERM GOALS: Target date: 12 weeks, 12/13/23  Modified Oswestry Low Back Pain Disability Questionnaire: 29 / 50 = 58.0 %  Baseline:  Goal status: INITIAL  2.  Safe gait with  appropriate device , over 350' for safe household distances, without balance loss Baseline:  Goal status: on going 10/26/23  3.  5 x sit to stand 14.8 sec or less without balance loss Baseline: 25 sec,with UE support B  required mod assist on final rep to avoid fall  Goal status: progressing 10/26/23    PLAN:  PT FREQUENCY: 2x/week  PT DURATION: 12 weeks  PLANNED INTERVENTIONS: 97110-Therapeutic exercises, 97530- Therapeutic activity, 97112- Neuromuscular re-education, 97535- Self Care, 14782- Manual therapy, G0283- Electrical stimulation (unattended), and 97033- Ionotophoresis 4mg /ml Dexamethasone   PLAN FOR NEXT SESSION: see if MRI results   Irl Bodie,ANGIE, PTA,  10/26/2023, 12:35 PM [place] [facility] at [facility] [address] [place], [place], [address] Phone: [phone]   Fax:  [phone]  Patient Details  Name: BURDETTE GERGELY MRN: [id number] Date of Birth: 1940-09-17 Referring Provider:  Kaylee Partridge, MD  Encounter Date: 10/26/2023   Aquilla Bayley, PTA 10/26/2023, 12:35 PM  [facility] at [facility] [address] [place], [place], [address] Phone: [phone]   Fax:  [phone] at [facility] [address] [place], [place], [address] Phone: [phone]   Fax:  [phone] [facility] at [facility] [address] [place], [place], [address] Phone: [phone]   Fax:  [phone]

## 2023-11-01 ENCOUNTER — Other Ambulatory Visit: Payer: Self-pay | Admitting: Family Medicine

## 2023-11-01 ENCOUNTER — Ambulatory Visit (HOSPITAL_BASED_OUTPATIENT_CLINIC_OR_DEPARTMENT_OTHER)
Admission: RE | Admit: 2023-11-01 | Discharge: 2023-11-01 | Disposition: A | Discharge status: Home / Self Care | Source: Ambulatory Visit | Attending: Family Medicine | Admitting: Family Medicine

## 2023-11-01 ENCOUNTER — Encounter (HOSPITAL_BASED_OUTPATIENT_CLINIC_OR_DEPARTMENT_OTHER): Payer: Self-pay

## 2023-11-01 DIAGNOSIS — M25551 Pain in right hip: Secondary | ICD-10-CM

## 2023-11-01 DIAGNOSIS — Z1231 Encounter for screening mammogram for malignant neoplasm of breast: Secondary | ICD-10-CM | POA: Diagnosis not present

## 2023-11-01 DIAGNOSIS — M5440 Lumbago with sciatica, unspecified side: Secondary | ICD-10-CM

## 2023-11-01 DIAGNOSIS — M159 Polyosteoarthritis, unspecified: Secondary | ICD-10-CM

## 2023-11-01 DIAGNOSIS — G8929 Other chronic pain: Secondary | ICD-10-CM

## 2023-11-01 NOTE — Telephone Encounter (Signed)
 Copied from CRM [id number]. Topic: Clinical - Medication Refill >> Nov 01, 2023 12:09 PM Ivette P wrote: Medication: HYDROcodone -acetaminophen  (NORCO) 7.5-325 MG tablet   Has the patient contacted their pharmacy? Yes (Agent: If no, request that the patient contact the pharmacy for the refill. If patient does not wish to contact the pharmacy document the reason why and proceed with request.) (Agent: If yes, when and what did the pharmacy advise?)  This is the patient's preferred pharmacy:  [facility] [id number] [place], [place] - [address] [place] [address] Phone: [phone] Fax: [phone]  Is this the correct pharmacy for this prescription? Yes If no, delete pharmacy and type the correct one.   Has the prescription been filled recently? Yes, 10/19/2023  Is the patient out of the medication? Yes, pt has 1 pill left  Has the patient been seen for an appointment in the last year OR does the patient have an upcoming appointment? Yes, 08/09/2023  Can we respond through MyChart? Yes  Agent: Please be advised that Rx refills may take up to 3 business days. We ask that you follow-up with your pharmacy.

## 2023-11-02 ENCOUNTER — Ambulatory Visit: Attending: Family Medicine | Admitting: Physical Therapy

## 2023-11-02 ENCOUNTER — Encounter: Payer: Self-pay | Admitting: Family Medicine

## 2023-11-02 ENCOUNTER — Other Ambulatory Visit: Payer: Self-pay | Admitting: Family Medicine

## 2023-11-02 DIAGNOSIS — I10 Essential (primary) hypertension: Secondary | ICD-10-CM

## 2023-11-02 DIAGNOSIS — R262 Difficulty in walking, not elsewhere classified: Secondary | ICD-10-CM | POA: Diagnosis not present

## 2023-11-02 DIAGNOSIS — R293 Abnormal posture: Secondary | ICD-10-CM | POA: Insufficient documentation

## 2023-11-02 DIAGNOSIS — M5442 Lumbago with sciatica, left side: Secondary | ICD-10-CM | POA: Diagnosis not present

## 2023-11-02 DIAGNOSIS — M5441 Lumbago with sciatica, right side: Secondary | ICD-10-CM | POA: Diagnosis not present

## 2023-11-02 DIAGNOSIS — G8929 Other chronic pain: Secondary | ICD-10-CM | POA: Diagnosis not present

## 2023-11-02 MED ORDER — HYDROCODONE-ACETAMINOPHEN 7.5-325 MG PO TABS: 1.0000 | ORAL_TABLET | Freq: Three times a day (TID) | ORAL | 0 refills | Status: DC | PRN

## 2023-11-02 NOTE — Therapy (Signed)
 OUTPATIENT PHYSICAL THERAPY LOWER EXTREMITY   Patient Name: Margaret [name] MRN: [id number] DOB:1940/11/03, 83 y.o., female Today's Date: 11/02/2023  END OF SESSION:  PT End of Session - 11/02/23 1145     Visit Number 6    Date for PT Re-Evaluation 12/13/23    PT Start Time 1145    PT Stop Time 1230    PT Time Calculation (min) 45 min              Past Medical History:  Diagnosis Date   Anxiety    Arthritis    Cancer (HCC)    lt. foot melanoma   Chronic kidney disease    lt. kidney cyst   Coronary artery disease    GERD (gastroesophageal reflux disease)    History of healed stress fracture 2013   bilateral feet   Hx of phlebitis    reports remote hx of "superfical blood clots" never anticoagulated   Hypertension    Osteopenia    Pneumonia    Past Surgical History:  Procedure Laterality Date   CATARACT EXTRACTION W/ INTRAOCULAR LENS IMPLANT Bilateral    CHOLECYSTECTOMY N/A 09/19/2016   Procedure: LAPAROSCOPIC CHOLECYSTECTOMY POSSIBLE INTRAOPERATIVE CHOLANGIOGRAM;  Surgeon: Enid Harry, MD;  Location: [facility] OR;  Service: General;  Laterality: N/A;   ERCP N/A 09/18/2016   Procedure: ENDOSCOPIC RETROGRADE CHOLANGIOPANCREATOGRAPHY (ERCP);  Surgeon: Albertina Hugger, MD;  Location: [facility]COPY;  Service: Endoscopy;  Laterality: N/A;   FOOT SURGERY     patient reports four foot surgeries   FOOT SURGERY Left    bunion, hammer toe surgery   FOOT SURGERY Right    shaved bunion, hammer toe correction   HERNIA REPAIR     KNEE ARTHROSCOPY Right 06/02/1995   REPLACEMENT TOTAL KNEE Left    REPLACEMENT TOTAL KNEE Left 10/30/2012   RETINAL DETACHMENT SURGERY     RETINAL DETACHMENT SURGERY Left 06/01/2006   ROBOTIC ASSITED PARTIAL NEPHRECTOMY Left 06/03/2022   Procedure: XI ROBOTIC ASSITED PARTIAL NEPHRECTOMY WITH ULTRASOUND AND VENTRAL HERNIA REPAIR;  Surgeon: Osborn Blaze, MD;  Location: [facility] ORS;  Service: Urology;  Laterality: Left;  3 HRS   THYROIDECTOMY,  PARTIAL Right    THYROIDECTOMY, PARTIAL  06/02/2003   TOTAL KNEE ARTHROPLASTY Right 11/13/2019   Procedure: TOTAL KNEE ARTHROPLASTY SDDC;  Surgeon: Liliane Rei, MD;  Location: [facility] ORS;  Service: Orthopedics;  Laterality: Right;    Patient Active Problem List   Diagnosis Date Noted   Renal mass 06/03/2022   Open wound of left side of back 01/20/2021   [place] 11/18/2020   Lipoma of back 11/18/2020   Lymphedema 11/18/2020   Melanocytic nevi of right upper limb, including shoulder 11/18/2020   Personal history of malignant melanoma of skin 11/18/2020   Sebaceous cyst 11/18/2020   Seborrheic dermatitis 11/18/2020   Pre-diabetes 04/03/2020   OA (osteoarthritis) of knee 11/13/2019   Total knee replacement status, 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   Pain in right knee 08/03/2019   Fluid collection at surgical site 10/20/2018   Encounter for postoperative examination after surgery for malignant neoplasm 09/30/2018   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/2018   Plantar fasciitis of right foot 02/03/2017   Porokeratosis 02/03/2017   Acute gallstone pancreatitis    Calculus of gallbladder with acute cholecystitis and obstruction    LFT elevation    Stress fracture 01/01/2016   Insomnia 12/16/2015   Peripheral neuropathy 10/18/2015   Renal insufficiency 03/06/2015   HTN (hypertension) 03/06/2015   Osteopenia 03/06/2015  GERD (gastroesophageal reflux disease) 03/06/2015   Vaginal pessary present 03/06/2015   Metatarsalgia 07/13/2012   Increased frequency of urination 04/13/2012   DDD (degenerative disc disease), cervical 01/19/2012   Knee stiffness 01/19/2012   Low back pain 01/19/2012   Neck pain 01/19/2012   Pain of hand 01/19/2012   Hallux valgus 09/20/2011   Hyperkeratosis 08/13/2011   Anxiety disorder 07/06/2011   Depression 07/06/2011   Diverticulosis of large intestine 07/06/2011   Non-toxic multinodular goiter 07/06/2011   Atrophic vaginitis 10/24/2010   Dermatographic  urticaria 09/17/2010   Inflamed seborrheic keratosis 09/17/2010   Closed fracture of fifth metatarsal bone 07/10/2010   Diagnosis unknown 02/27/2010   Hip pain 06/08/2008   Pulmonary nodule seen on imaging study 06/08/2008    PCP: Gates Kasal, MD  REFERRING PROVIDER: Adelaide Adjutant, MD, [facility]  REFERRING DIAG: degeneration of discs of lumbar spine  THERAPY DIAG:  Chronic bilateral low back pain with right-sided sciatica  Chronic bilateral low back pain with left-sided sciatica  Difficulty in walking, not elsewhere classified  Posture imbalance  Rationale for Evaluation and Treatment: Rehabilitation  ONSET DATE: chronic  SUBJECTIVE:   SUBJECTIVE STATEMENT:  MRI scanned in chart- shows stenosis mostly. Seeing Ramos next week.    PERTINENT HISTORY: Saw orthopedist, took prednisone, which didn't help, orthopedist may want to do MRI, referred to PT by both PCP and orthopedist, also B degenerative arthritis hips PAIN:  Are you having pain? 10/10  PRECAUTIONS: Fall  RED FLAGS: None   WEIGHT BEARING RESTRICTIONS: No  FALLS:  Has patient fallen in last 6 months? Yes. Number of falls several  LIVING ENVIRONMENT: Lives with: lives with their family and lives with their daughter Lives in: House/apartment Stairs: Yes: Internal: one flight steps; on left going up Has following equipment at home: Environmental consultant - 4 wheeled  OCCUPATION: retired  PLOF: Independent and Independent with basic ADLs  PATIENT GOALS: reduce pain hips and lower back so I can tolerate more activity  NEXT MD VISIT: not scheduled yet  OBJECTIVE:  Note: Objective measures were completed at Evaluation unless otherwise noted.  DIAGNOSTIC FINDINGS: X-ray done 11/23; IMPRESSION: 1. 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 2. Lower lumbar spondylosis.  PATIENT SURVEYS:  Modified Oswestry Modified Oswestry Low Back Pain Disability Questionnaire: 29 / 50 = 58.0  %   COGNITION: Overall cognitive status: Within functional limits for tasks assessed     SENSATION: Light touch: WFL  EDEMA:  None noted   MUSCLE LENGTH: Hamstrings: Right wfl deg; Left wfl deg Andy Bannister test: Right wfl deg; Left wfl deg  POSTURE: scoliotic posture noted concavity L lumbar, pronounced thoracic kyphosis  PALPATION: Not tender with light palpation B gr trochanters hips  LOWER EXTREMITY ROM:B hip ROM wnl, able to achieve tailor position each leg in sitting  LUMBAR ROM: FB limited 15%, also risk of falling pt needed UE support Lumbar ext, 25% with marked side bending to L with this motion SB B 35% B with central lower back pain LOWER EXTREMITY MMT:  MMT Right eval Left eval  Hip flexion    Hip extension    Hip abduction    Hip adduction    Hip internal rotation    Hip external rotation    Knee flexion    Knee extension    Ankle dorsiflexion    Ankle plantarflexion    Ankle inversion    Ankle eversion     (Blank rows = not tested)  FUNCTIONAL TESTS:  5 times sit to stand: 25 sec, required mod assist of PT to avoid falling backwards into chair with final attempt  GAIT: Distance walked: in clinic up to 60  Assistive device utilized: Single point cane, Walker - 2 wheeled, and none Level of assistance: Min A without device and with cane Comments: very unstable without device                                                                                                                                 TREATMENT DATE:   11/02/23 L 5 Looked over MRI with pt and I explained and answered questions. Educ on stenosis and need to strengthen to support spine and we also discussed injection in spine Gait training with SPC and HHA-did fair,strongly rec use of RW for safety and to help reduce strain on back Black tband trunk ext 2 sets 10 Seated 3# dumbbell ,uptall-core engaged chest press 15 x then OH 10 x Standing CG A shld ext and row 2 sets 10 yellow  tband Sitting on dyna disc for increased lumb stab- 3# LAQ,hip flex,hip abd 2 sets 10,Green tband HS curl 2 sets 10 HHA 3# marching fwd and abck 15 feet 2 x Then side stepping  10/26/23 Nustep L 5 8 min Sitting on dyna disc pelvic ROM 4 ways 10 x, LAQ,hip flex and hip abd 10 x each,red tband scap stab shld ext and row Supine feet on ball bridge,KTC,obl and isometric abdominals MEch Lumb traction 40# 15 min with [facility]   10/19/23 Nustep L 5 Mech lumbar traction with MH 15 min static 30# then increase to 35# after 5 min Supine ppt 10 x. Supine modified small bridge 10x Supine clams 10 x manual resistance Supine ADD ball squeeze manual resistance Supine hip flexion Isometric abdominals   10/12/23 2# LE seated LAQ,hip flex, hip abd Red tband row and shld ext 2 sets 10 seated Green tband trunk ext  2 sets 10 Standing lateral hip f=shift 10 x each side Standing 3# trunk rotation Standing 3 # lateral trunk flexion 10 x Nustep L 1 6 min HHA side stepping 8 feet 2 x each HHA marching fwd and backward 8 feet 2 x     10/06/23: Min assist to walk from lobby to clinic, and from clinic to lobby, unsteady antalgic L, flexed posture trunk, hips Nustep, UE's and LE's , level 1 x 6 min  Seated for hamstring curls with yellow t band 15 x each leg Seated hip add isometric with ball 5 sec holds 15 reps Seated rows with yellow t band 15 reps Supine for therapist assisted stretching each hip, flex, ext, rotation gentle  Attempted B knee to chest but too painful.   09/20/23:   Evaluation, explained to pt that long term management of arthritic changes in hips in spine, should respond well to moderate exercise. Practiced gait with st cane, pt without any  marked improvement in her stability with the use of the cane.  Therefore practiced with front wheeled walker, marked improvement in gait pattern . Recommended that she use a front wheeled walker which is more light weight and easier to maneuver in  and out of car for outings, she is a high fall risk.  PATIENT EDUCATION:  Education details: POC, goals Person educated: Patient Education method: Explanation, Demonstration, Tactile cues, and Verbal cues Education comprehension: verbalized understanding and returned demonstration  HOME EXERCISE PROGRAM: Green t band hip abd/ER in sitting and green t band ankle pumps L .   ASSESSMENT:  CLINICAL IMPRESSION:  Looked over MRI with pt and I explained and answered questions. Educ on stenosis and need to strengthen to support spine and we also discussed injection in spine Gait training with SPC and HHA-did fair,strongly rec use of RW for safety and to help reduce strain on back. Overall pt was moving better today . Eval:Patient is a 83 y.o. female who was evaluated today by physical therapy due to chronic central lower back pain as well as B hip pain.  One large concern is that she has experienced several falls in the last 6 months, today required min / HHA for safe gait in the clinic, does have a rollator that she uses at home but does not use when out due to weight of the rollator loading in and out of car.  Recommended standard front wheel walker for community outings.  Presents with postural changes in lumbar spine and gross B LE weakness .  Will benefit from skilled physical therapy to address her deficits and improve her pain, safety, functional mobility. OBJECTIVE IMPAIRMENTS: decreased activity tolerance, decreased balance, decreased knowledge of condition, decreased knowledge of use of DME, decreased mobility, difficulty walking, decreased ROM, decreased strength, decreased safety awareness, impaired perceived functional ability, postural dysfunction, and pain.   ACTIVITY LIMITATIONS: carrying, lifting, bending, standing, squatting, transfers, bed mobility, and locomotion level  PARTICIPATION LIMITATIONS: meal prep, cleaning, laundry, shopping, community activity, and yard work  PERSONAL  FACTORS: Age, Behavior pattern, Fitness, Past/current experiences, Time since onset of injury/illness/exacerbation, and 1-2 comorbidities: severe DJD B hips, lumbar region, neuropathy are also affecting patient's functional outcome.   REHAB POTENTIAL: Good  CLINICAL DECISION MAKING: Evolving/moderate complexity  EVALUATION COMPLEXITY: Moderate   GOALS: Goals reviewed with patient? Yes  SHORT TERM GOALS: Target date: 2 weeks 5 5/25 I HEP Baseline: Goal status: MET 10/26/23   LONG TERM GOALS: Target date: 12 weeks, 12/13/23  Modified Oswestry Low Back Pain Disability Questionnaire: 29 / 50 = 58.0 %  Baseline:  Goal status: INITIAL  2.  Safe gait with appropriate device , over 350' for safe household distances, without balance loss Baseline:  Goal status: on going 10/26/23  3.  5 x sit to stand 14.8 sec or less without balance loss Baseline: 25 sec,with UE support B  required mod assist on final rep to avoid fall  Goal status: progressing 10/26/23    PLAN:  PT FREQUENCY: 2x/week  PT DURATION: 12 weeks  PLANNED INTERVENTIONS: 97110-Therapeutic exercises, 97530- Therapeutic activity, 97112- Neuromuscular re-education, 97535- Self Care, 09811- Manual therapy, G0283- Electrical stimulation (unattended), and 97033- Ionotophoresis 4mg /ml Dexamethasone   PLAN FOR NEXT SESSION: MD next week.    Mike Berntsen,ANGIE, PTA,  11/02/2023, 11:47 AM  [facility] at [facility] [address] [place], [place], [address] Phone: [phone]   Fax:  [phone]  Patient Details  Name: Margaret [name] MRN: [id number]  Date of Birth: Aug 15, 1940 Referring Provider:  Kaylee Partridge, MD  Encounter Date: 11/02/2023   Aquilla Bayley, PTA 11/02/2023, 11:47 AM  [facility] at [facility] [address] [place], [place], [address] Phone: [phone]   Fax:  [phone] at [facility] [address] [place], [place], [address] Phone: [phone]   Fax:  [phone] [facility] at [facility] [address] [place], [place], [address] Phone: [phone]   Fax:  [phone] at [facility] [address] [place], [place], [address] Phone: [phone]   Fax:  [phone]

## 2023-11-04 ENCOUNTER — Ambulatory Visit: Admitting: Physical Therapy

## 2023-11-04 DIAGNOSIS — R293 Abnormal posture: Secondary | ICD-10-CM

## 2023-11-04 DIAGNOSIS — G8929 Other chronic pain: Secondary | ICD-10-CM

## 2023-11-04 DIAGNOSIS — R262 Difficulty in walking, not elsewhere classified: Secondary | ICD-10-CM

## 2023-11-04 DIAGNOSIS — M5442 Lumbago with sciatica, left side: Secondary | ICD-10-CM | POA: Diagnosis not present

## 2023-11-04 DIAGNOSIS — M5441 Lumbago with sciatica, right side: Secondary | ICD-10-CM | POA: Diagnosis not present

## 2023-11-04 NOTE — Therapy (Signed)
 OUTPATIENT PHYSICAL THERAPY LOWER EXTREMITY   Patient Name: Margaret [name] MRN: [id number] DOB:16-Sep-1940, 83 y.o., female Today's Date: 11/04/2023  END OF SESSION:  PT End of Session - 11/04/23 1434     Visit Number 7    Date for PT Re-Evaluation 12/13/23    PT Start Time 1445    PT Stop Time 1530    PT Time Calculation (min) 45 min              Past Medical History:  Diagnosis Date   Anxiety    Arthritis    Cancer (HCC)    lt. foot melanoma   Chronic kidney disease    lt. kidney cyst   Coronary artery disease    GERD (gastroesophageal reflux disease)    History of healed stress fracture 2013   bilateral feet   Hx of phlebitis    reports remote hx of "superfical blood clots" never anticoagulated   Hypertension    Osteopenia    Pneumonia    Past Surgical History:  Procedure Laterality Date   CATARACT EXTRACTION W/ INTRAOCULAR LENS IMPLANT Bilateral    CHOLECYSTECTOMY N/A 09/19/2016   Procedure: LAPAROSCOPIC CHOLECYSTECTOMY POSSIBLE INTRAOPERATIVE CHOLANGIOGRAM;  Surgeon: Enid Harry, MD;  Location: [facility] OR;  Service: General;  Laterality: N/A;   ERCP N/A 09/18/2016   Procedure: ENDOSCOPIC RETROGRADE CHOLANGIOPANCREATOGRAPHY (ERCP);  Surgeon: Albertina Hugger, MD;  Location: [facility] ENDOSCOPY;  Service: Endoscopy;  Laterality: N/A;   FOOT SURGERY     patient reports four foot surgeries   FOOT SURGERY Left    bunion, hammer toe surgery   FOOT SURGERY Right    shaved bunion, hammer toe correction   HERNIA REPAIR     KNEE ARTHROSCOPY Right 06/02/1995   REPLACEMENT TOTAL KNEE Left    REPLACEMENT TOTAL KNEE Left 10/30/2012   RETINAL DETACHMENT SURGERY     RETINAL DETACHMENT SURGERY Left 06/01/2006   ROBOTIC ASSITED PARTIAL NEPHRECTOMY Left 06/03/2022   Procedure: XI ROBOTIC ASSITED PARTIAL NEPHRECTOMY WITH ULTRASOUND AND VENTRAL HERNIA REPAIR;  Surgeon: Osborn Blaze, MD;  Location: [facility] ORS;  Service: Urology;  Laterality: Left;  3 HRS   THYROIDECTOMY,  PARTIAL Right    THYROIDECTOMY, PARTIAL  06/02/2003   TOTAL KNEE ARTHROPLASTY Right 11/13/2019   Procedure: TOTAL KNEE ARTHROPLASTY SDDC;  Surgeon: Liliane Rei, MD;  Location: [facility] ORS;  Service: Orthopedics;  Laterality: Right;    Patient Active Problem List   Diagnosis Date Noted   Renal mass 06/03/2022   Open wound of left side of back 01/20/2021   [place] 11/18/2020   Lipoma of back 11/18/2020   Lymphedema 11/18/2020   Melanocytic nevi of right upper limb, including shoulder 11/18/2020   Personal history of malignant melanoma of skin 11/18/2020   Sebaceous cyst 11/18/2020   Seborrheic dermatitis 11/18/2020   Pre-diabetes 04/03/2020   OA (osteoarthritis) of knee 11/13/2019   Total knee replacement status, 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   Pain in right knee 08/03/2019   Fluid collection at surgical site 10/20/2018   Encounter for postoperative examination after surgery for malignant neoplasm 09/30/2018   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/2018   Plantar fasciitis of right foot 02/03/2017   Porokeratosis 02/03/2017   Acute gallstone pancreatitis    Calculus of gallbladder with acute cholecystitis and obstruction    LFT elevation    Stress fracture 01/01/2016   Insomnia 12/16/2015   Peripheral neuropathy 10/18/2015   Renal insufficiency 03/06/2015   HTN (hypertension) 03/06/2015   Osteopenia 03/06/2015  GERD (gastroesophageal reflux disease) 03/06/2015   Vaginal pessary present 03/06/2015   Metatarsalgia 07/13/2012   Increased frequency of urination 04/13/2012   DDD (degenerative disc disease), cervical 01/19/2012   Knee stiffness 01/19/2012   Low back pain 01/19/2012   Neck pain 01/19/2012   Pain of hand 01/19/2012   Hallux valgus 09/20/2011   Hyperkeratosis 08/13/2011   Anxiety disorder 07/06/2011   Depression 07/06/2011   Diverticulosis of large intestine 07/06/2011   Non-toxic multinodular goiter 07/06/2011   Atrophic vaginitis 10/24/2010   Dermatographic  urticaria 09/17/2010   Inflamed seborrheic keratosis 09/17/2010   Closed fracture of fifth metatarsal bone 07/10/2010   Diagnosis unknown 02/27/2010   Hip pain 06/08/2008   Pulmonary nodule seen on imaging study 06/08/2008    PCP: Gates Kasal, MD  REFERRING PROVIDER: Adelaide Adjutant, MD, [facility]  REFERRING DIAG: degeneration of discs of lumbar spine  THERAPY DIAG:  Chronic bilateral low back pain with right-sided sciatica  Chronic bilateral low back pain with left-sided sciatica  Difficulty in walking, not elsewhere classified  Posture imbalance  Rationale for Evaluation and Treatment: Rehabilitation  ONSET DATE: chronic  SUBJECTIVE:   SUBJECTIVE STATEMENT:  amb in without ad ,no assistance. Improved upright posture. " Very pleased, doing so much better getting OOB"    PERTINENT HISTORY: Saw orthopedist, took prednisone, which didn't help, orthopedist may want to do MRI, referred to PT by both PCP and orthopedist, also B degenerative arthritis hips PAIN:  Are you having pain? 10/10  PRECAUTIONS: Fall  RED FLAGS: None   WEIGHT BEARING RESTRICTIONS: No  FALLS:  Has patient fallen in last 6 months? Yes. Number of falls several  LIVING ENVIRONMENT: Lives with: lives with their family and lives with their daughter Lives in: House/apartment Stairs: Yes: Internal: one flight steps; on left going up Has following equipment at home: Environmental consultant - 4 wheeled  OCCUPATION: retired  PLOF: Independent and Independent with basic ADLs  PATIENT GOALS: reduce pain hips and lower back so I can tolerate more activity  NEXT MD VISIT: not scheduled yet  OBJECTIVE:  Note: Objective measures were completed at Evaluation unless otherwise noted.  DIAGNOSTIC FINDINGS: X-ray done 11/23; IMPRESSION: 1. 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 2. Lower lumbar spondylosis.  PATIENT SURVEYS:  Modified Oswestry Modified Oswestry Low Back Pain  Disability Questionnaire: 29 / 50 = 58.0 %   COGNITION: Overall cognitive status: Within functional limits for tasks assessed     SENSATION: Light touch: WFL  EDEMA:  None noted   MUSCLE LENGTH: Hamstrings: Right wfl deg; Left wfl deg Andy Bannister test: Right wfl deg; Left wfl deg  POSTURE: scoliotic posture noted concavity L lumbar, pronounced thoracic kyphosis  PALPATION: Not tender with light palpation B gr trochanters hips  LOWER EXTREMITY ROM:B hip ROM wnl, able to achieve tailor position each leg in sitting  LUMBAR ROM: FB limited 15%, also risk of falling pt needed UE support Lumbar ext, 25% with marked side bending to L with this motion SB B 35% B with central lower back pain LOWER EXTREMITY MMT:  MMT Right eval Left eval  Hip flexion    Hip extension    Hip abduction    Hip adduction    Hip internal rotation    Hip external rotation    Knee flexion    Knee extension    Ankle dorsiflexion    Ankle plantarflexion    Ankle inversion    Ankle eversion     (  Blank rows = not tested)   FUNCTIONAL TESTS:  5 times sit to stand: 25 sec, required mod assist of PT to avoid falling backwards into chair with final attempt  GAIT: Distance walked: in clinic up to 60  Assistive device utilized: Single point cane, Walker - 2 wheeled, and none Level of assistance: Min A without device and with cane Comments: very unstable without device                                                                                                                                 TREATMENT DATE:   11/04/23  Nustep L 5 8 min Black tband trunk ext 2 sets10, flexion green tband 2 sets 10 Seated row 15 # 2 sets 10 STS 10 x without UE 3# LAQ,hip flex and abd 2 sets 10-cued to not rock body and engage core Seated 3# dumbbell ,uptall-core engaged chest press 15 x then OH 10 x 3# LE HHA marching fwd,back and laterally Green tband HS curl 2 sets 10     11/02/23 L 5 Looked over MRI  with pt and I explained and answered questions. Educ on stenosis and need to strengthen to support spine and we also discussed injection in spine Gait training with SPC and HHA-did fair,strongly rec use of RW for safety and to help reduce strain on back Black tband trunk ext 2 sets 10 Seated 3# dumbbell ,uptall-core engaged chest press 15 x then OH 10 x Standing CG A shld ext and row 2 sets 10 yellow tband Sitting on dyna disc for increased lumb stab- 3# LAQ,hip flex,hip abd 2 sets 10,Green tband HS curl 2 sets 10 HHA 3# marching fwd and abck 15 feet 2 x Then side stepping  10/26/23 Nustep L 5 8 min Sitting on dyna disc pelvic ROM 4 ways 10 x, LAQ,hip flex and hip abd 10 x each,red tband scap stab shld ext and row Supine feet on ball bridge,KTC,obl and isometric abdominals MEch Lumb traction 40# 15 min with [facility]   10/19/23 Nustep L 5 Mech lumbar traction with MH 15 min static 30# then increase to 35# after 5 min Supine ppt 10 x. Supine modified small bridge 10x Supine clams 10 x manual resistance Supine ADD ball squeeze manual resistance Supine hip flexion Isometric abdominals   10/12/23 2# LE seated LAQ,hip flex, hip abd Red tband row and shld ext 2 sets 10 seated Green tband trunk ext  2 sets 10 Standing lateral hip f=shift 10 x each side Standing 3# trunk rotation Standing 3 # lateral trunk flexion 10 x Nustep L 1 6 min HHA side stepping 8 feet 2 x each HHA marching fwd and backward 8 feet 2 x     10/06/23: Min assist to walk from lobby to clinic, and from clinic to lobby, unsteady antalgic L, flexed posture trunk, hips Nustep, UE's and LE's , level 1 x 6 min  Seated  for hamstring curls with yellow t band 15 x each leg Seated hip add isometric with ball 5 sec holds 15 reps Seated rows with yellow t band 15 reps Supine for therapist assisted stretching each hip, flex, ext, rotation gentle  Attempted B knee to chest but too painful.   09/20/23:   Evaluation, explained  to pt that long term management of arthritic changes in hips in spine, should respond well to moderate exercise. Practiced gait with st cane, pt without any marked improvement in her stability with the use of the cane.  Therefore practiced with front wheeled walker, marked improvement in gait pattern . Recommended that she use a front wheeled walker which is more light weight and easier to maneuver in and out of car for outings, she is a high fall risk.  PATIENT EDUCATION:  Education details: POC, goals Person educated: Patient Education method: Explanation, Demonstration, Tactile cues, and Verbal cues Education comprehension: verbalized understanding and returned demonstration  HOME EXERCISE PROGRAM: Green t band hip abd/ER in sitting and green t band ankle pumps L .   ASSESSMENT:  CLINICAL IMPRESSION:pt moving much better, less pain and very pleased with progress.continued with strengthening with cuing needed to not rock body. Pt amb btwn all interventiosn without AD with sup-CGA,no LOB, slight fwd flex and lateral sway.Goals assessed and documented.  OBJECTIVE IMPAIRMENTS: decreased activity tolerance, decreased balance, decreased knowledge of condition, decreased knowledge of use of DME, decreased mobility, difficulty walking, decreased ROM, decreased strength, decreased safety awareness, impaired perceived functional ability, postural dysfunction, and pain.   ACTIVITY LIMITATIONS: carrying, lifting, bending, standing, squatting, transfers, bed mobility, and locomotion level  PARTICIPATION LIMITATIONS: meal prep, cleaning, laundry, shopping, community activity, and yard work  PERSONAL FACTORS: Age, Behavior pattern, Fitness, Past/current experiences, Time since onset of injury/illness/exacerbation, and 1-2 comorbidities: severe DJD B hips, lumbar region, neuropathy are also affecting patient's functional outcome.   REHAB POTENTIAL: Good  CLINICAL DECISION MAKING: Evolving/moderate  complexity  EVALUATION COMPLEXITY: Moderate   GOALS: Goals reviewed with patient? Yes  SHORT TERM GOALS: Target date: 2 weeks 5 5/25 I HEP Baseline: Goal status: MET 10/26/23   LONG TERM GOALS: Target date: 12 weeks, 12/13/23  Modified Oswestry Low Back Pain Disability Questionnaire: 29 / 50 = 58.0 %  Baseline:  Goal status: INITIAL  2.  Safe gait with appropriate device , over 350' for safe household distances, without balance loss Baseline:  Goal status: on going 10/26/23  progressing 11/04/23  3.  5 x sit to stand 14.8 sec or less without balance loss Baseline: 25 sec,with UE support B  required mod assist on final rep to avoid fall  Goal status: progressing 10/26/23  progressing 11/05/23    PLAN:  PT FREQUENCY: 2x/week  PT DURATION: 12 weeks  PLANNED INTERVENTIONS: 97110-Therapeutic exercises, 97530- Therapeutic activity, 97112- Neuromuscular re-education, 97535- Self Care, 24401- Manual therapy, G0283- Electrical stimulation (unattended), and 97033- Ionotophoresis 4mg /ml Dexamethasone   PLAN FOR NEXT SESSION: MD next week. Progress as tolerable with strength and func   Omolara Carol,ANGIE, PTA,  11/04/2023, 2:34 PM [facility] [address] [facility] [place], [place], [address] Phone: [phone]   Fax:  [phone]

## 2023-11-05 ENCOUNTER — Ambulatory Visit: Admitting: Physical Therapy

## 2023-11-10 ENCOUNTER — Ambulatory Visit: Admitting: Obstetrics & Gynecology

## 2023-11-10 DIAGNOSIS — M4807 Spinal stenosis, lumbosacral region: Secondary | ICD-10-CM | POA: Diagnosis not present

## 2023-11-11 ENCOUNTER — Ambulatory Visit: Admitting: Physical Therapy

## 2023-11-11 DIAGNOSIS — R262 Difficulty in walking, not elsewhere classified: Secondary | ICD-10-CM

## 2023-11-11 DIAGNOSIS — G8929 Other chronic pain: Secondary | ICD-10-CM | POA: Diagnosis not present

## 2023-11-11 DIAGNOSIS — R293 Abnormal posture: Secondary | ICD-10-CM

## 2023-11-11 DIAGNOSIS — M5442 Lumbago with sciatica, left side: Secondary | ICD-10-CM | POA: Diagnosis not present

## 2023-11-11 DIAGNOSIS — M5441 Lumbago with sciatica, right side: Secondary | ICD-10-CM | POA: Diagnosis not present

## 2023-11-11 NOTE — Therapy (Signed)
 OUTPATIENT PHYSICAL THERAPY LOWER EXTREMITY   Patient Name: Margaret [name] MRN: [id number] DOB:1941-02-15, 83 y.o., female Today's Date: 11/11/2023  END OF SESSION:  PT End of Session - 11/11/23 1534     Visit Number 8    Date for PT Re-Evaluation 12/13/23    PT Start Time 1530    PT Stop Time 1615    PT Time Calculation (min) 45 min           Past Medical History:  Diagnosis Date   Anxiety    Arthritis    Cancer (HCC)    lt. foot melanoma   Chronic kidney disease    lt. kidney cyst   Coronary artery disease    GERD (gastroesophageal reflux disease)    History of healed stress fracture 2013   bilateral feet   Hx of phlebitis    reports remote hx of superfical blood clots never anticoagulated   Hypertension    Osteopenia    Pneumonia    Past Surgical History:  Procedure Laterality Date   CATARACT EXTRACTION W/ INTRAOCULAR LENS IMPLANT Bilateral    CHOLECYSTECTOMY N/A 09/19/2016   Procedure: LAPAROSCOPIC CHOLECYSTECTOMY POSSIBLE INTRAOPERATIVE CHOLANGIOGRAM;  Surgeon: Enid Harry, MD;  Location: [facility] OR;  Service: General;  Laterality: N/A;   ERCP N/A 09/18/2016   Procedure: ENDOSCOPIC RETROGRADE CHOLANGIOPANCREATOGRAPHY (ERCP);  Surgeon: Albertina Hugger, MD;  Location: [facility] ENDOSCOPY;  Service: Endoscopy;  Laterality: N/A;   FOOT SURGERY     patient reports four foot surgeries   FOOT SURGERY Left    bunion, hammer toe surgery   FOOT SURGERY Right    shaved bunion, hammer toe correction   HERNIA REPAIR     KNEE ARTHROSCOPY Right 06/02/1995   REPLACEMENT TOTAL KNEE Left    REPLACEMENT TOTAL KNEE Left 10/30/2012   RETINAL DETACHMENT SURGERY     RETINAL DETACHMENT SURGERY Left 06/01/2006   ROBOTIC ASSITED PARTIAL NEPHRECTOMY Left 06/03/2022   Procedure: XI ROBOTIC ASSITED PARTIAL NEPHRECTOMY WITH ULTRASOUND AND VENTRAL HERNIA REPAIR;  Surgeon: Osborn Blaze, MD;  Location: [facility] ORS;  Service: Urology;  Laterality: Left;  3 HRS   THYROIDECTOMY, PARTIAL  Right    THYROIDECTOMY, PARTIAL  06/02/2003   TOTAL KNEE ARTHROPLASTY Right 11/13/2019   Procedure: TOTAL KNEE ARTHROPLASTY SDDC;  Surgeon: Liliane Rei, MD;  Location: [facility] ORS;  Service: Orthopedics;  Laterality: Right;    Patient Active Problem List   Diagnosis Date Noted   Renal mass 06/03/2022   Open wound of left side of back 01/20/2021   [place] 11/18/2020   Lipoma of back 11/18/2020   Lymphedema 11/18/2020   Melanocytic nevi of right upper limb, including shoulder 11/18/2020   Personal history of malignant melanoma of skin 11/18/2020   Sebaceous cyst 11/18/2020   Seborrheic dermatitis 11/18/2020   Pre-diabetes 04/03/2020   OA (osteoarthritis) of knee 11/13/2019   Total knee replacement status, 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   Pain in right knee 08/03/2019   Fluid collection at surgical site 10/20/2018   Encounter for postoperative examination after surgery for malignant neoplasm 09/30/2018   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/2018   Plantar fasciitis of right foot 02/03/2017   Porokeratosis 02/03/2017   Acute gallstone pancreatitis    Calculus of gallbladder with acute cholecystitis and obstruction    LFT elevation    Stress fracture 01/01/2016   Insomnia 12/16/2015   Peripheral neuropathy 10/18/2015   Renal insufficiency 03/06/2015   HTN (hypertension) 03/06/2015   Osteopenia 03/06/2015   GERD (gastroesophageal  reflux disease) 03/06/2015   Vaginal pessary present 03/06/2015   Metatarsalgia 07/13/2012   Increased frequency of urination 04/13/2012   DDD (degenerative disc disease), cervical 01/19/2012   Knee stiffness 01/19/2012   Low back pain 01/19/2012   Neck pain 01/19/2012   Pain of hand 01/19/2012   Hallux valgus 09/20/2011   Hyperkeratosis 08/13/2011   Anxiety disorder 07/06/2011   Depression 07/06/2011   Diverticulosis of large intestine 07/06/2011   Non-toxic multinodular goiter 07/06/2011   Atrophic vaginitis 10/24/2010   Dermatographic urticaria  09/17/2010   Inflamed seborrheic keratosis 09/17/2010   Closed fracture of fifth metatarsal bone 07/10/2010   Diagnosis unknown 02/27/2010   Hip pain 06/08/2008   Pulmonary nodule seen on imaging study 06/08/2008    PCP: Gates Kasal, MD  REFERRING PROVIDER: Adelaide Adjutant, MD, [facility]  REFERRING DIAG: degeneration of discs of lumbar spine  THERAPY DIAG:  Chronic bilateral low back pain with right-sided sciatica  Chronic bilateral low back pain with left-sided sciatica  Difficulty in walking, not elsewhere classified  Posture imbalance  Rationale for Evaluation and Treatment: Rehabilitation  ONSET DATE: chronic  SUBJECTIVE:   SUBJECTIVE STATEMENT:  amb in without AD but holding CG arm. Saw Ramos yesterday and he was very pleased and wants PT to continue and f/u in July for possible inj.  PERTINENT HISTORY: Saw orthopedist, took prednisone, which didn't help, orthopedist may want to do MRI, referred to PT by both PCP and orthopedist, also B degenerative arthritis hips PAIN:  Are you having pain? 10/10  PRECAUTIONS: Fall  RED FLAGS: None   WEIGHT BEARING RESTRICTIONS: No  FALLS:  Has patient fallen in last 6 months? Yes. Number of falls several  LIVING ENVIRONMENT: Lives with: lives with their family and lives with their daughter Lives in: House/apartment Stairs: Yes: Internal: one flight steps; on left going up Has following equipment at home: Environmental consultant - 4 wheeled  OCCUPATION: retired  PLOF: Independent and Independent with basic ADLs  PATIENT GOALS: reduce pain hips and lower back so I can tolerate more activity  NEXT MD VISIT: not scheduled yet  OBJECTIVE:  Note: Objective measures were completed at Evaluation unless otherwise noted.  DIAGNOSTIC FINDINGS: X-ray done 11/23; IMPRESSION: 1. 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 2. Lower lumbar spondylosis.  PATIENT SURVEYS:  Modified Oswestry Modified  Oswestry Low Back Pain Disability Questionnaire: 29 / 50 = 58.0 %   COGNITION: Overall cognitive status: Within functional limits for tasks assessed     SENSATION: Light touch: WFL  EDEMA:  None noted   MUSCLE LENGTH: Hamstrings: Right wfl deg; Left wfl deg Andy Bannister test: Right wfl deg; Left wfl deg  POSTURE: scoliotic posture noted concavity L lumbar, pronounced thoracic kyphosis  PALPATION: Not tender with light palpation B gr trochanters hips  LOWER EXTREMITY ROM:B hip ROM wnl, able to achieve tailor position each leg in sitting  LUMBAR ROM: FB limited 15%, also risk of falling pt needed UE support Lumbar ext, 25% with marked side bending to L with this motion SB B 35% B with central lower back pain LOWER EXTREMITY MMT:  MMT Right eval Left eval  Hip flexion    Hip extension    Hip abduction    Hip adduction    Hip internal rotation    Hip external rotation    Knee flexion    Knee extension    Ankle dorsiflexion    Ankle plantarflexion    Ankle inversion  Ankle eversion     (Blank rows = not tested)   FUNCTIONAL TESTS:  5 times sit to stand: 25 sec, required mod assist of PT to avoid falling backwards into chair with final attempt  GAIT: Distance walked: in clinic up to 60  Assistive device utilized: Single point cane, Walker - 2 wheeled, and none Level of assistance: Min A without device and with cane Comments: very unstable without device                                                                                                                                 TREATMENT DATE:   11/11/23 Nustep L 5 Ball toss on airex min A,instability but no LOB Standing on airex V+CGA red tband shld ext and row 10 x STS elevated mat on airex 5 x cuing CG-min ,posterior lean 2 sets Standing on airex HHA 2# LE ex- marching,SL hip flex,ext and abd Simulated folding walker and sliding into car CG-min A with cuing Green tband HS curl 2 sets 10 Red tband LAQ 2  sets 10    11/04/23  Nustep L 5 8 min Black tband trunk ext 2 sets10, flexion green tband 2 sets 10 Seated row 15 # 2 sets 10 STS 10 x without UE 3# LAQ,hip flex and abd 2 sets 10-cued to not rock body and engage core Seated 3# dumbbell ,uptall-core engaged chest press 15 x then OH 10 x 3# LE HHA marching fwd,back and laterally Green tband HS curl 2 sets 10     11/02/23 L 5 Looked over MRI with pt and I explained and answered questions. Educ on stenosis and need to strengthen to support spine and we also discussed injection in spine Gait training with SPC and HHA-did fair,strongly rec use of RW for safety and to help reduce strain on back Black tband trunk ext 2 sets 10 Seated 3# dumbbell ,uptall-core engaged chest press 15 x then OH 10 x Standing CG A shld ext and row 2 sets 10 yellow tband Sitting on dyna disc for increased lumb stab- 3# LAQ,hip flex,hip abd 2 sets 10,Green tband HS curl 2 sets 10 HHA 3# marching fwd and abck 15 feet 2 x Then side stepping  10/26/23 Nustep L 5 8 min Sitting on dyna disc pelvic ROM 4 ways 10 x, LAQ,hip flex and hip abd 10 x each,red tband scap stab shld ext and row Supine feet on ball bridge,KTC,obl and isometric abdominals MEch Lumb traction 40# 15 min with [facility]   10/19/23 Nustep L 5 Mech lumbar traction with MH 15 min static 30# then increase to 35# after 5 min Supine ppt 10 x. Supine modified small bridge 10x Supine clams 10 x manual resistance Supine ADD ball squeeze manual resistance Supine hip flexion Isometric abdominals   10/12/23 2# LE seated LAQ,hip flex, hip abd Red tband row and shld ext 2 sets 10 seated Green tband trunk  ext  2 sets 10 Standing lateral hip f=shift 10 x each side Standing 3# trunk rotation Standing 3 # lateral trunk flexion 10 x Nustep L 1 6 min HHA side stepping 8 feet 2 x each HHA marching fwd and backward 8 feet 2 x     10/06/23: Min assist to walk from lobby to clinic, and from clinic to  lobby, unsteady antalgic L, flexed posture trunk, hips Nustep, UE's and LE's , level 1 x 6 min  Seated for hamstring curls with yellow t band 15 x each leg Seated hip add isometric with ball 5 sec holds 15 reps Seated rows with yellow t band 15 reps Supine for therapist assisted stretching each hip, flex, ext, rotation gentle  Attempted B knee to chest but too painful.   09/20/23:   Evaluation, explained to pt that long term management of arthritic changes in hips in spine, should respond well to moderate exercise. Practiced gait with st cane, pt without any marked improvement in her stability with the use of the cane.  Therefore practiced with front wheeled walker, marked improvement in gait pattern . Recommended that she use a front wheeled walker which is more light weight and easier to maneuver in and out of car for outings, she is a high fall risk.  PATIENT EDUCATION:  Education details: POC, goals Person educated: Patient Education method: Explanation, Demonstration, Tactile cues, and Verbal cues Education comprehension: verbalized understanding and returned demonstration  HOME EXERCISE PROGRAM: Green t band hip abd/ER in sitting and green t band ankle pumps L .   ASSESSMENT:  CLINICAL IMPRESSION:pt moving much better,up taller and walking better. Simulated getting walker in/out car and started more balance acvtities. As pain has decreased we are able ot do more. OBJECTIVE IMPAIRMENTS: decreased activity tolerance, decreased balance, decreased knowledge of condition, decreased knowledge of use of DME, decreased mobility, difficulty walking, decreased ROM, decreased strength, decreased safety awareness, impaired perceived functional ability, postural dysfunction, and pain.   ACTIVITY LIMITATIONS: carrying, lifting, bending, standing, squatting, transfers, bed mobility, and locomotion level  PARTICIPATION LIMITATIONS: meal prep, cleaning, laundry, shopping, community activity, and yard  work  PERSONAL FACTORS: Age, Behavior pattern, Fitness, Past/current experiences, Time since onset of injury/illness/exacerbation, and 1-2 comorbidities: severe DJD B hips, lumbar region, neuropathy are also affecting patient's functional outcome.   REHAB POTENTIAL: Good  CLINICAL DECISION MAKING: Evolving/moderate complexity  EVALUATION COMPLEXITY: Moderate   GOALS: Goals reviewed with patient? Yes  SHORT TERM GOALS: Target date: 2 weeks 5 5/25 I HEP Baseline: Goal status: MET 10/26/23   LONG TERM GOALS: Target date: 12 weeks, 12/13/23  Modified Oswestry Low Back Pain Disability Questionnaire: 29 / 50 = 58.0 %  Baseline:  Goal status: 11/11/23 progressing  2.  Safe gait with appropriate device , over 350' for safe household distances, without balance loss Baseline:  Goal status: on going 10/26/23  progressing 11/04/23  and 11/11/23  3.  5 x sit to stand 14.8 sec or less without balance loss Baseline: 25 sec,with UE support B  required mod assist on final rep to avoid fall  Goal status: progressing 10/26/23  progressing 11/05/23    PLAN:  PT FREQUENCY: 2x/week  PT DURATION: 12 weeks  PLANNED INTERVENTIONS: 97110-Therapeutic exercises, 97530- Therapeutic activity, 97112- Neuromuscular re-education, 97535- Self Care, 16109- Manual therapy, G0283- Electrical stimulation (unattended), and 97033- Ionotophoresis 4mg /ml Dexamethasone   PLAN FOR NEXT SESSION: Progress as tolerable with strength and func. Balance   Aerilynn Goin,ANGIE, PTA,  11/11/2023, 3:35 PM [place]  [facility] [address] [facility] [place], [place], [address] Phone: [phone]   Fax:  [phone]

## 2023-11-15 ENCOUNTER — Ambulatory Visit (INDEPENDENT_AMBULATORY_CARE_PROVIDER_SITE_OTHER): Admitting: Podiatry

## 2023-11-15 DIAGNOSIS — D2372 Other benign neoplasm of skin of left lower limb, including hip: Secondary | ICD-10-CM | POA: Diagnosis not present

## 2023-11-15 DIAGNOSIS — G629 Polyneuropathy, unspecified: Secondary | ICD-10-CM | POA: Diagnosis not present

## 2023-11-15 DIAGNOSIS — M216X1 Other acquired deformities of right foot: Secondary | ICD-10-CM | POA: Diagnosis not present

## 2023-11-15 NOTE — Patient Instructions (Signed)
 Keep the bandage on for 24 hours. At that time, remove and clean with soap and water. If it hurts or burns before 24 hours go ahead and remove the bandage and wash with soap and water. Keep the area clean. If there is any blistering cover with antibiotic ointment and a bandage. Monitor for any redness, drainage, or other signs of infection. Call the office if any are to occur. If you have any questions, please call the office at [phone].

## 2023-11-16 ENCOUNTER — Ambulatory Visit: Admitting: Physical Therapy

## 2023-11-16 DIAGNOSIS — G8929 Other chronic pain: Secondary | ICD-10-CM

## 2023-11-16 DIAGNOSIS — R293 Abnormal posture: Secondary | ICD-10-CM

## 2023-11-16 DIAGNOSIS — R262 Difficulty in walking, not elsewhere classified: Secondary | ICD-10-CM

## 2023-11-16 DIAGNOSIS — M5442 Lumbago with sciatica, left side: Secondary | ICD-10-CM | POA: Diagnosis not present

## 2023-11-16 DIAGNOSIS — M5441 Lumbago with sciatica, right side: Secondary | ICD-10-CM | POA: Diagnosis not present

## 2023-11-16 NOTE — Therapy (Signed)
 OUTPATIENT PHYSICAL THERAPY LOWER EXTREMITY   Patient Name: Margaret [name] MRN: [id number] DOB:05/03/1941, 83 y.o., female Today's Date: 11/16/2023  END OF SESSION:  PT End of Session - 11/16/23 1005     Visit Number 9    Date for PT Re-Evaluation 12/13/23    PT Start Time 1015    PT Stop Time 1100    PT Time Calculation (min) 45 min           Past Medical History:  Diagnosis Date   Anxiety    Arthritis    Cancer (HCC)    lt. foot melanoma   Chronic kidney disease    lt. kidney cyst   Coronary artery disease    GERD (gastroesophageal reflux disease)    History of healed stress fracture 2013   bilateral feet   Hx of phlebitis    reports remote hx of superfical blood clots never anticoagulated   Hypertension    Osteopenia    Pneumonia    Past Surgical History:  Procedure Laterality Date   CATARACT EXTRACTION W/ INTRAOCULAR LENS IMPLANT Bilateral    CHOLECYSTECTOMY N/A 09/19/2016   Procedure: LAPAROSCOPIC CHOLECYSTECTOMY POSSIBLE INTRAOPERATIVE CHOLANGIOGRAM;  Surgeon: Enid Harry, MD;  Location: [facility] OR;  Service: General;  Laterality: N/A;   ERCP N/A 09/18/2016   Procedure: ENDOSCOPIC RETROGRADE CHOLANGIOPANCREATOGRAPHY (ERCP);  Surgeon: Albertina Hugger, MD;  Location: [facility] ENDOSCOPY;  Service: Endoscopy;  Laterality: N/A;   FOOT SURGERY     patient reports four foot surgeries   FOOT SURGERY Left    bunion, hammer toe surgery   FOOT SURGERY Right    shaved bunion, hammer toe correction   HERNIA REPAIR     KNEE ARTHROSCOPY Right 06/02/1995   REPLACEMENT TOTAL KNEE Left    REPLACEMENT TOTAL KNEE Left 10/30/2012   RETINAL DETACHMENT SURGERY     RETINAL DETACHMENT SURGERY Left 06/01/2006   ROBOTIC ASSITED PARTIAL NEPHRECTOMY Left 06/03/2022   Procedure: XI ROBOTIC ASSITED PARTIAL NEPHRECTOMY WITH ULTRASOUND AND VENTRAL HERNIA REPAIR;  Surgeon: Osborn Blaze, MD;  Location: [facility] ORS;  Service: Urology;  Laterality: Left;  3 HRS   THYROIDECTOMY, PARTIAL  Right    THYROIDECTOMY, PARTIAL  06/02/2003   TOTAL KNEE ARTHROPLASTY Right 11/13/2019   Procedure: TOTAL KNEE ARTHROPLASTY SDDC;  Surgeon: Liliane Rei, MD;  Location: [facility] ORS;  Service: Orthopedics;  Laterality: Right;    Patient Active Problem List   Diagnosis Date Noted   Renal mass 06/03/2022   Open wound of left side of back 01/20/2021   [place] 11/18/2020   Lipoma of back 11/18/2020   Lymphedema 11/18/2020   Melanocytic nevi of right upper limb, including shoulder 11/18/2020   Personal history of malignant melanoma of skin 11/18/2020   Sebaceous cyst 11/18/2020   Seborrheic dermatitis 11/18/2020   Pre-diabetes 04/03/2020   OA (osteoarthritis) of knee 11/13/2019   Total knee replacement status, 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   Pain in right knee 08/03/2019   Fluid collection at surgical site 10/20/2018   Encounter for postoperative examination after surgery for malignant neoplasm 09/30/2018   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/2018   Plantar fasciitis of right foot 02/03/2017   Porokeratosis 02/03/2017   Acute gallstone pancreatitis    Calculus of gallbladder with acute cholecystitis and obstruction    LFT elevation    Stress fracture 01/01/2016   Insomnia 12/16/2015   Peripheral neuropathy 10/18/2015   Renal insufficiency 03/06/2015   HTN (hypertension) 03/06/2015   Osteopenia 03/06/2015   GERD (gastroesophageal  reflux disease) 03/06/2015   Vaginal pessary present 03/06/2015   Metatarsalgia 07/13/2012   Increased frequency of urination 04/13/2012   DDD (degenerative disc disease), cervical 01/19/2012   Knee stiffness 01/19/2012   Low back pain 01/19/2012   Neck pain 01/19/2012   Pain of hand 01/19/2012   Hallux valgus 09/20/2011   Hyperkeratosis 08/13/2011   Anxiety disorder 07/06/2011   Depression 07/06/2011   Diverticulosis of large intestine 07/06/2011   Non-toxic multinodular goiter 07/06/2011   Atrophic vaginitis 10/24/2010   Dermatographic urticaria  09/17/2010   Inflamed seborrheic keratosis 09/17/2010   Closed fracture of fifth metatarsal bone 07/10/2010   Diagnosis unknown 02/27/2010   Hip pain 06/08/2008   Pulmonary nodule seen on imaging study 06/08/2008    PCP: Gates Kasal, MD  REFERRING PROVIDER: Adelaide Adjutant, MD, [facility]  REFERRING DIAG: degeneration of discs of lumbar spine  THERAPY DIAG:  Chronic bilateral low back pain with right-sided sciatica  Chronic bilateral low back pain with left-sided sciatica  Difficulty in walking, not elsewhere classified  Posture imbalance  Rationale for Evaluation and Treatment: Rehabilitation  ONSET DATE: chronic  SUBJECTIVE:   SUBJECTIVE STATEMENT:   Pt drove here, did it but very nervous. A lot of pain after last session- for days    PERTINENT HISTORY: Saw orthopedist, took prednisone, which didn't help, orthopedist may want to do MRI, referred to PT by both PCP and orthopedist, also B degenerative arthritis hips PAIN:  Are you having pain? 10/10  PRECAUTIONS: Fall  RED FLAGS: None   WEIGHT BEARING RESTRICTIONS: No  FALLS:  Has patient fallen in last 6 months? Yes. Number of falls several  LIVING ENVIRONMENT: Lives with: lives with their family and lives with their daughter Lives in: House/apartment Stairs: Yes: Internal: one flight steps; on left going up Has following equipment at home: Environmental consultant - 4 wheeled  OCCUPATION: retired  PLOF: Independent and Independent with basic ADLs  PATIENT GOALS: reduce pain hips and lower back so I can tolerate more activity  NEXT MD VISIT: not scheduled yet  OBJECTIVE:  Note: Objective measures were completed at Evaluation unless otherwise noted.  DIAGNOSTIC FINDINGS: X-ray done 11/23; IMPRESSION: 1. 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 2. Lower lumbar spondylosis.  PATIENT SURVEYS:  Modified Oswestry Modified Oswestry Low Back Pain Disability Questionnaire: 29 / 50  = 58.0 %   COGNITION: Overall cognitive status: Within functional limits for tasks assessed     SENSATION: Light touch: WFL  EDEMA:  None noted   MUSCLE LENGTH: Hamstrings: Right wfl deg; Left wfl deg Andy Bannister test: Right wfl deg; Left wfl deg  POSTURE: scoliotic posture noted concavity L lumbar, pronounced thoracic kyphosis  PALPATION: Not tender with light palpation B gr trochanters hips  LOWER EXTREMITY ROM:B hip ROM wnl, able to achieve tailor position each leg in sitting  LUMBAR ROM: FB limited 15%, also risk of falling pt needed UE support Lumbar ext, 25% with marked side bending to L with this motion SB B 35% B with central lower back pain LOWER EXTREMITY MMT:  MMT Right eval Left eval  Hip flexion    Hip extension    Hip abduction    Hip adduction    Hip internal rotation    Hip external rotation    Knee flexion    Knee extension    Ankle dorsiflexion    Ankle plantarflexion    Ankle inversion    Ankle eversion     (Blank  rows = not tested)   FUNCTIONAL TESTS:  5 times sit to stand: 25 sec, required mod assist of PT to avoid falling backwards into chair with final attempt  GAIT: Distance walked: in clinic up to 60  Assistive device utilized: Single point cane, Walker - 2 wheeled, and none Level of assistance: Min A without device and with cane Comments: very unstable without device                                                                                                                                 TREATMENT DATE:   11/16/23 Car transfer mod I Mod I getting walker in and out of car Nustep L 5 20# resisted gait fwd and backward 5 x, 3 x each side  HHA 3# LAQ 2 sets 10 Green tband HS curl 2 sets 10 Green tband hip abd 2 sets 10 Green tband hip flexion 2 sets 10 Resisted seated trunk ext 2 sets 10  11/11/23 Nustep L 5 Ball toss on airex min A,instability but no LOB Standing on airex V+CGA red tband shld ext and row 10 x STS  elevated mat on airex 5 x cuing CG-min ,posterior lean 2 sets Standing on airex HHA 2# LE ex- marching,SL hip flex,ext and abd Simulated folding walker and sliding into car CG-min A with cuing Green tband HS curl 2 sets 10 Red tband LAQ 2 sets 10    11/04/23  Nustep L 5 8 min Black tband trunk ext 2 sets10, flexion green tband 2 sets 10 Seated row 15 # 2 sets 10 STS 10 x without UE 3# LAQ,hip flex and abd 2 sets 10-cued to not rock body and engage core Seated 3# dumbbell ,uptall-core engaged chest press 15 x then OH 10 x 3# LE HHA marching fwd,back and laterally Green tband HS curl 2 sets 10     11/02/23 L 5 Looked over MRI with pt and I explained and answered questions. Educ on stenosis and need to strengthen to support spine and we also discussed injection in spine Gait training with SPC and HHA-did fair,strongly rec use of RW for safety and to help reduce strain on back Black tband trunk ext 2 sets 10 Seated 3# dumbbell ,uptall-core engaged chest press 15 x then OH 10 x Standing CG A shld ext and row 2 sets 10 yellow tband Sitting on dyna disc for increased lumb stab- 3# LAQ,hip flex,hip abd 2 sets 10,Green tband HS curl 2 sets 10 HHA 3# marching fwd and abck 15 feet 2 x Then side stepping  10/26/23 Nustep L 5 8 min Sitting on dyna disc pelvic ROM 4 ways 10 x, LAQ,hip flex and hip abd 10 x each,red tband scap stab shld ext and row Supine feet on ball bridge,KTC,obl and isometric abdominals MEch Lumb traction 40# 15 min with [facility]   10/19/23 Nustep L 5 Mech lumbar traction with MH 15  min static 30# then increase to 35# after 5 min Supine ppt 10 x. Supine modified small bridge 10x Supine clams 10 x manual resistance Supine ADD ball squeeze manual resistance Supine hip flexion Isometric abdominals   10/12/23 2# LE seated LAQ,hip flex, hip abd Red tband row and shld ext 2 sets 10 seated Green tband trunk ext  2 sets 10 Standing lateral hip f=shift 10 x each  side Standing 3# trunk rotation Standing 3 # lateral trunk flexion 10 x Nustep L 1 6 min HHA side stepping 8 feet 2 x each HHA marching fwd and backward 8 feet 2 x     10/06/23: Min assist to walk from lobby to clinic, and from clinic to lobby, unsteady antalgic L, flexed posture trunk, hips Nustep, UE's and LE's , level 1 x 6 min  Seated for hamstring curls with yellow t band 15 x each leg Seated hip add isometric with ball 5 sec holds 15 reps Seated rows with yellow t band 15 reps Supine for therapist assisted stretching each hip, flex, ext, rotation gentle  Attempted B knee to chest but too painful.   09/20/23:   Evaluation, explained to pt that long term management of arthritic changes in hips in spine, should respond well to moderate exercise. Practiced gait with st cane, pt without any marked improvement in her stability with the use of the cane.  Therefore practiced with front wheeled walker, marked improvement in gait pattern . Recommended that she use a front wheeled walker which is more light weight and easier to maneuver in and out of car for outings, she is a high fall risk.  PATIENT EDUCATION:  Education details: POC, goals Person educated: Patient Education method: Explanation, Demonstration, Tactile cues, and Verbal cues Education comprehension: verbalized understanding and returned demonstration  HOME EXERCISE PROGRAM: Green t band hip abd/ER in sitting and green t band ankle pumps L .   ASSESSMENT:  CLINICAL IMPRESSION:  pt drove to therapy, PTA met pt in parking lot and pt able to safely demo getting RW in and out of back seat, folding walker and good balance. Pt c/o increased pain for days after last session so we adjusted strengthening ex with less standing to see if helps pain. Overall pt is doing much better and up straighter- very pleased with progress   OBJECTIVE IMPAIRMENTS: decreased activity tolerance, decreased balance, decreased knowledge of condition,  decreased knowledge of use of DME, decreased mobility, difficulty walking, decreased ROM, decreased strength, decreased safety awareness, impaired perceived functional ability, postural dysfunction, and pain.   ACTIVITY LIMITATIONS: carrying, lifting, bending, standing, squatting, transfers, bed mobility, and locomotion level  PARTICIPATION LIMITATIONS: meal prep, cleaning, laundry, shopping, community activity, and yard work  PERSONAL FACTORS: Age, Behavior pattern, Fitness, Past/current experiences, Time since onset of injury/illness/exacerbation, and 1-2 comorbidities: severe DJD B hips, lumbar region, neuropathy are also affecting patient's functional outcome.   REHAB POTENTIAL: Good  CLINICAL DECISION MAKING: Evolving/moderate complexity  EVALUATION COMPLEXITY: Moderate   GOALS: Goals reviewed with patient? Yes  SHORT TERM GOALS: Target date: 2 weeks 5 5/25 I HEP Baseline: Goal status: MET 10/26/23   LONG TERM GOALS: Target date: 12 weeks, 12/13/23  Modified Oswestry Low Back Pain Disability Questionnaire: 29 / 50 = 58.0 %  Baseline:  Goal status: 11/11/23 progressing  2.  Safe gait with appropriate device , over 350' for safe household distances, without balance loss Baseline:  Goal status: on going 10/26/23  progressing 11/04/23  and 11/11/23  3.  5 x sit to stand 14.8 sec or less without balance loss Baseline: 25 sec,with UE support B  required mod assist on final rep to avoid fall  Goal status: progressing 10/26/23  progressing 11/05/23    PLAN:  PT FREQUENCY: 2x/week  PT DURATION: 12 weeks  PLANNED INTERVENTIONS: 97110-Therapeutic exercises, 97530- Therapeutic activity, 97112- Neuromuscular re-education, 97535- Self Care, 97140- Manual therapy, G0283- Electrical stimulation (unattended), and 97033- Ionotophoresis 4mg /ml Dexamethasone   PLAN FOR NEXT SESSION: Progress as tolerable with strength and func. Balance   Shelle Galdamez,ANGIE, PTA,  11/16/2023, 10:06 AM [facility] [place], [place], [address] Phone: [phone]   Fax:  [phone]

## 2023-11-17 NOTE — Progress Notes (Signed)
 Subjective: Chief Complaint  Patient presents with   Callouses    RM#11 Patient states is here for follow up on calluses and spot on left foot causing discomfort.     83 year old female presents for follow-up evaluation undergoing soft tissue mass excision left foot.  Since that she still gets pain in the bottom of the left foot on the area the incision.  She denies any open lesions or any drainage.  She does not have significant pain on the right foot.  He is on medication for neuropathy, which helps but does not really limiting her symptoms.   Objective: AAO x3, NAD DP/PT pulses palpable bilaterally, CRT less than 3 seconds On the left foot submetatarsal on the incision is a hyperkeratotic lesion along the area the incision.  Does not appear to be a verruca more of a hole in the incision.  On debridement there is no underlying ulceration, drainage or signs of infection no bleeding noted. Minimal callus formation submetatarsal right foot without any underlying ulceration.  Digital deformities are noted with prominent metatarsal heads. No pain with calf compression, swelling, warmth, erythema  Assessment: Skin lesion left foot  Plan:  Left foot - Debrided lesion with any complications or bleeding x 1.  Cleaned the skin with alcohol.  Salicylic acid was applied followed by bandage.  Postprocedure instructions discussed.  Monitoring signs or symptoms of infection. - If symptoms persist consider reexcision in the future.  Right foot - Lesion appears right minimal and has healthy skin underlying without any signs of an, ulceration or concern at this time.  Hold off any surgery at this time and monitor for any reoccurrence.  Discussed prominent metatarsal head.  Discussed moisturizer and continue offloading.  Neuropathy - Acute was covered but she has not met her out-of-pocket yet therefore due to cost we will hold off on this. - Continue gabapentin    Charity Conch DPM

## 2023-11-18 ENCOUNTER — Ambulatory Visit: Admitting: Physical Therapy

## 2023-11-18 DIAGNOSIS — R262 Difficulty in walking, not elsewhere classified: Secondary | ICD-10-CM

## 2023-11-18 DIAGNOSIS — M5441 Lumbago with sciatica, right side: Secondary | ICD-10-CM | POA: Diagnosis not present

## 2023-11-18 DIAGNOSIS — R293 Abnormal posture: Secondary | ICD-10-CM

## 2023-11-18 DIAGNOSIS — G8929 Other chronic pain: Secondary | ICD-10-CM

## 2023-11-18 DIAGNOSIS — M5442 Lumbago with sciatica, left side: Secondary | ICD-10-CM | POA: Diagnosis not present

## 2023-11-18 NOTE — Therapy (Signed)
 OUTPATIENT PHYSICAL THERAPY LOWER EXTREMITY Progress Note Reporting Period 09/20/23 to 11/18/23  See note below for Objective Data and Assessment of Progress/Goals.      Patient Name: Margaret [name] MRN: [id number] DOB:[birth year], 83 y.o., female Today's Date: 11/18/2023  END OF SESSION:  PT End of Session - 11/18/23 1554     Visit Number 10    Date for PT Re-Evaluation 12/13/23    PT Start Time 1530    PT Stop Time 1615    PT Time Calculation (min) 45 min           Past Medical History:  Diagnosis Date   Anxiety    Arthritis    Cancer (HCC)    lt. foot melanoma   Chronic kidney disease    lt. kidney cyst   Coronary artery disease    GERD (gastroesophageal reflux disease)    History of healed stress fracture 2013   bilateral feet   Hx of phlebitis    reports remote hx of superfical blood clots never anticoagulated   Hypertension    Osteopenia    Pneumonia    Past Surgical History:  Procedure Laterality Date   CATARACT EXTRACTION W/ INTRAOCULAR LENS IMPLANT Bilateral    CHOLECYSTECTOMY N/A 09/19/2016   Procedure: LAPAROSCOPIC CHOLECYSTECTOMY POSSIBLE INTRAOPERATIVE CHOLANGIOGRAM;  Surgeon: Enid Harry, MD;  Location: [facility] OR;  Service: General;  Laterality: N/A;   ERCP N/A 09/18/2016   Procedure: ENDOSCOPIC RETROGRADE CHOLANGIOPANCREATOGRAPHY (ERCP);  Surgeon: Albertina Hugger, MD;  Location: [facility] ENDOSCOPY;  Service: Endoscopy;  Laterality: N/A;   FOOT SURGERY     patient reports four foot surgeries   FOOT SURGERY Left    bunion, hammer toe surgery   FOOT SURGERY Right    shaved bunion, hammer toe correction   HERNIA REPAIR     KNEE ARTHROSCOPY Right 06/02/1995   REPLACEMENT TOTAL KNEE Left    REPLACEMENT TOTAL KNEE Left 10/30/2012   RETINAL DETACHMENT SURGERY     RETINAL DETACHMENT SURGERY Left 06/01/2006   ROBOTIC ASSITED PARTIAL NEPHRECTOMY Left 06/03/2022   Procedure: XI ROBOTIC ASSITED PARTIAL NEPHRECTOMY WITH ULTRASOUND AND VENTRAL HERNIA  REPAIR;  Surgeon: Osborn Blaze, MD;  Location: [facility] ORS;  Service: Urology;  Laterality: Left;  3 HRS   THYROIDECTOMY, PARTIAL Right    THYROIDECTOMY, PARTIAL  06/02/2003   TOTAL KNEE ARTHROPLASTY Right 11/13/2019   Procedure: TOTAL KNEE ARTHROPLASTY SDDC;  Surgeon: Liliane Rei, MD;  Location: [facility] ORS;  Service: Orthopedics;  Laterality: Right;    Patient Active Problem List   Diagnosis Date Noted   Renal mass 06/03/2022   Open wound of left side of back 01/20/2021   [place] 11/18/2020   Lipoma of back 11/18/2020   Lymphedema 11/18/2020   Melanocytic nevi of right upper limb, including shoulder 11/18/2020   Personal history of malignant melanoma of skin 11/18/2020   Sebaceous cyst 11/18/2020   Seborrheic dermatitis 11/18/2020   Pre-diabetes 04/03/2020   OA (osteoarthritis) of knee 11/13/2019   Total knee replacement status, 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   Pain in right knee 08/03/2019   Fluid collection at surgical site 10/20/2018   Encounter for postoperative examination after surgery for malignant neoplasm 09/30/2018   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/2018   Plantar fasciitis of right foot 02/03/2017   Porokeratosis 02/03/2017   Acute gallstone pancreatitis    Calculus of gallbladder with acute cholecystitis and obstruction    LFT elevation    Stress fracture 01/01/2016   Insomnia 12/16/2015  Peripheral neuropathy 10/18/2015   Renal insufficiency 03/06/2015   HTN (hypertension) 03/06/2015   Osteopenia 03/06/2015   GERD (gastroesophageal reflux disease) 03/06/2015   Vaginal pessary present 03/06/2015   Metatarsalgia 07/13/2012   Increased frequency of urination 04/13/2012   DDD (degenerative disc disease), cervical 01/19/2012   Knee stiffness 01/19/2012   Low back pain 01/19/2012   Neck pain 01/19/2012   Pain of hand 01/19/2012   Hallux valgus 09/20/2011   Hyperkeratosis 08/13/2011   Anxiety disorder 07/06/2011   Depression 07/06/2011   Diverticulosis of  large intestine 07/06/2011   Non-toxic multinodular goiter 07/06/2011   Atrophic vaginitis 10/24/2010   Dermatographic urticaria 09/17/2010   Inflamed seborrheic keratosis 09/17/2010   Closed fracture of fifth metatarsal bone 07/10/2010   Diagnosis unknown 02/27/2010   Hip pain 06/08/2008   Pulmonary nodule seen on imaging study 06/08/2008    PCP: Gates Kasal, MD  REFERRING PROVIDER: Adelaide Adjutant, MD, [facility]  REFERRING DIAG: degeneration of discs of lumbar spine  THERAPY DIAG:  Chronic bilateral low back pain with right-sided sciatica  Chronic bilateral low back pain with left-sided sciatica  Difficulty in walking, not elsewhere classified  Posture imbalance  Rationale for Evaluation and Treatment: Rehabilitation  ONSET DATE: chronic  SUBJECTIVE:   SUBJECTIVE STATEMENT:  hurting today for some readon    PERTINENT HISTORY: Saw orthopedist, took prednisone, which didn't help, orthopedist may want to do MRI, referred to PT by both PCP and orthopedist, also B degenerative arthritis hips PAIN:  Are you having pain? 5/10  PRECAUTIONS: Fall  RED FLAGS: None   WEIGHT BEARING RESTRICTIONS: No  FALLS:  Has patient fallen in last 6 months? Yes. Number of falls several  LIVING ENVIRONMENT: Lives with: lives with their family and lives with their daughter Lives in: House/apartment Stairs: Yes: Internal: one flight steps; on left going up Has following equipment at home: Environmental consultant - 4 wheeled  OCCUPATION: retired  PLOF: Independent and Independent with basic ADLs  PATIENT GOALS: reduce pain hips and lower back so I can tolerate more activity  NEXT MD VISIT: not scheduled yet  OBJECTIVE:  Note: Objective measures were completed at Evaluation unless otherwise noted.  DIAGNOSTIC FINDINGS: X-ray done 11/23; IMPRESSION: 1. 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 2. Lower lumbar spondylosis.  PATIENT SURVEYS:  Modified  Oswestry Modified Oswestry Low Back Pain Disability Questionnaire: 29 / 50 = 58.0 %   COGNITION: Overall cognitive status: Within functional limits for tasks assessed     SENSATION: Light touch: WFL  EDEMA:  None noted   MUSCLE LENGTH: Hamstrings: Right wfl deg; Left wfl deg Andy Bannister test: Right wfl deg; Left wfl deg  POSTURE: scoliotic posture noted concavity L lumbar, pronounced thoracic kyphosis  PALPATION: Not tender with light palpation B gr trochanters hips  LOWER EXTREMITY ROM:B hip ROM wnl, able to achieve tailor position each leg in sitting  LUMBAR ROM: FB limited 15%, also risk of falling pt needed UE support Lumbar ext, 25% with marked side bending to L with this motion SB B 35% B with central lower back pain LOWER EXTREMITY MMT:  MMT Right eval Left eval  Hip flexion    Hip extension    Hip abduction    Hip adduction    Hip internal rotation    Hip external rotation    Knee flexion    Knee extension    Ankle dorsiflexion    Ankle plantarflexion    Ankle inversion  Ankle eversion     (Blank rows = not tested)   FUNCTIONAL TESTS:  5 times sit to stand: 25 sec, required mod assist of PT to avoid falling backwards into chair with final attempt  GAIT: Distance walked: in clinic up to 60  Assistive device utilized: Single point cane, Walker - 2 wheeled, and none Level of assistance: Min A without device and with cane Comments: very unstable without device                                                                                                                                 TREATMENT DATE:    11/18/23 Supine core stab ex on MH Red tband hip flex bent knee,clams 2 sets 10 ADD ball squeeze 2 sets 10 SLR 2 sets 10 PROM LE and trunk Seated red tband shld ext and row 2 seated rows Resisted trunk ext 2 sets10 Red tband LAQ 2 sets 10 Green tband HS HS curl 2 sets 10  11/16/23 Car transfer mod I Mod I getting walker in and out of car Nustep  L 5 20# resisted gait fwd and backward 5 x, 3 x each side  HHA 3# LAQ 2 sets 10 Green tband HS curl 2 sets 10 Green tband hip abd 2 sets 10 Green tband hip flexion 2 sets 10 Resisted seated trunk ext 2 sets 10  11/11/23 Nustep L 5 Ball toss on airex min A,instability but no LOB Standing on airex V+CGA red tband shld ext and row 10 x STS elevated mat on airex 5 x cuing CG-min ,posterior lean 2 sets Standing on airex HHA 2# LE ex- marching,SL hip flex,ext and abd Simulated folding walker and sliding into car CG-min A with cuing Green tband HS curl 2 sets 10 Red tband LAQ 2 sets 10    11/04/23  Nustep L 5 8 min Black tband trunk ext 2 sets10, flexion green tband 2 sets 10 Seated row 15 # 2 sets 10 STS 10 x without UE 3# LAQ,hip flex and abd 2 sets 10-cued to not rock body and engage core Seated 3# dumbbell ,uptall-core engaged chest press 15 x then OH 10 x 3# LE HHA marching fwd,back and laterally Green tband HS curl 2 sets 10     11/02/23 L 5 Looked over MRI with pt and I explained and answered questions. Educ on stenosis and need to strengthen to support spine and we also discussed injection in spine Gait training with SPC and HHA-did fair,strongly rec use of RW for safety and to help reduce strain on back Black tband trunk ext 2 sets 10 Seated 3# dumbbell ,uptall-core engaged chest press 15 x then OH 10 x Standing CG A shld ext and row 2 sets 10 yellow tband Sitting on dyna disc for increased lumb stab- 3# LAQ,hip flex,hip abd 2 sets 10,Green tband HS curl 2 sets 10 HHA 3# marching fwd and abck  15 feet 2 x Then side stepping  10/26/23 Nustep L 5 8 min Sitting on dyna disc pelvic ROM 4 ways 10 x, LAQ,hip flex and hip abd 10 x each,red tband scap stab shld ext and row Supine feet on ball bridge,KTC,obl and isometric abdominals MEch Lumb traction 40# 15 min with [facility]   10/19/23 Nustep L 5 Mech lumbar traction with MH 15 min static 30# then increase to 35#  after 5 min Supine ppt 10 x. Supine modified small bridge 10x Supine clams 10 x manual resistance Supine ADD ball squeeze manual resistance Supine hip flexion Isometric abdominals   10/12/23 2# LE seated LAQ,hip flex, hip abd Red tband row and shld ext 2 sets 10 seated Green tband trunk ext  2 sets 10 Standing lateral hip f=shift 10 x each side Standing 3# trunk rotation Standing 3 # lateral trunk flexion 10 x Nustep L 1 6 min HHA side stepping 8 feet 2 x each HHA marching fwd and backward 8 feet 2 x     10/06/23: Min assist to walk from lobby to clinic, and from clinic to lobby, unsteady antalgic L, flexed posture trunk, hips Nustep, UE's and LE's , level 1 x 6 min  Seated for hamstring curls with yellow t band 15 x each leg Seated hip add isometric with ball 5 sec holds 15 reps Seated rows with yellow t band 15 reps Supine for therapist assisted stretching each hip, flex, ext, rotation gentle  Attempted B knee to chest but too painful.   09/20/23:   Evaluation, explained to pt that long term management of arthritic changes in hips in spine, should respond well to moderate exercise. Practiced gait with st cane, pt without any marked improvement in her stability with the use of the cane.  Therefore practiced with front wheeled walker, marked improvement in gait pattern . Recommended that she use a front wheeled walker which is more light weight and easier to maneuver in and out of car for outings, she is a high fall risk.  PATIENT EDUCATION:  Education details: POC, goals Person educated: Patient Education method: Explanation, Demonstration, Tactile cues, and Verbal cues Education comprehension: verbalized understanding and returned demonstration  HOME EXERCISE PROGRAM: Green t band hip abd/ER in sitting and green t band ankle pumps L .   ASSESSMENT:  CLINICAL IMPRESSION:  pt arrived with c/o increased pian but unsure why. Supien core stab on MH that gave some relief. PROM  and stretch LE ,tightness BIL L>Rt. Assessed and documented goals.Pt is making progress with skille dtherapy and is very pleased with ability to walk better, more upright and even drive. Pt will continue to benefit to gain strength to maximize imdependence and prevent falls.  OBJECTIVE IMPAIRMENTS: decreased activity tolerance, decreased balance, decreased knowledge of condition, decreased knowledge of use of DME, decreased mobility, difficulty walking, decreased ROM, decreased strength, decreased safety awareness, impaired perceived functional ability, postural dysfunction, and pain.   ACTIVITY LIMITATIONS: carrying, lifting, bending, standing, squatting, transfers, bed mobility, and locomotion level  PARTICIPATION LIMITATIONS: meal prep, cleaning, laundry, shopping, community activity, and yard work  PERSONAL FACTORS: Age, Behavior pattern, Fitness, Past/current experiences, Time since onset of injury/illness/exacerbation, and 1-2 comorbidities: severe DJD B hips, lumbar region, neuropathy are also affecting patient's functional outcome.   REHAB POTENTIAL: Good  CLINICAL DECISION MAKING: Evolving/moderate complexity  EVALUATION COMPLEXITY: Moderate   GOALS: Goals reviewed with patient? Yes  SHORT TERM GOALS: Target date: 2 weeks 5 5/25 I HEP Baseline: Goal status:  MET 10/26/23   LONG TERM GOALS: Target date: 12 weeks, 12/13/23  Modified Oswestry Low Back Pain Disability Questionnaire: 29 / 50 = 58.0 %  Baseline:  Goal status: 11/11/23 progressing  and 11/18/23  2.  Safe gait with appropriate device , over 350' for safe household distances, without balance loss Baseline:  Goal status: on going 10/26/23  progressing 11/04/23  and 11/11/23  Progressing 11/18/23  3.  5 x sit to stand 14.8 sec or less without balance loss Baseline: 25 sec,with UE support B  required mod assist on final rep to avoid fall  Goal status: progressing 10/26/23  progressing 11/05/23  11/18/23 multi tries and posterior  lean,progressing    PLAN:  PT FREQUENCY: 2x/week  PT DURATION: 12 weeks  PLANNED INTERVENTIONS: 97110-Therapeutic exercises, 97530- Therapeutic activity, 97112- Neuromuscular re-education, 97535- Self Care, 16109- Manual therapy, G0283- Electrical stimulation (unattended), and 97033- Ionotophoresis 4mg /ml Dexamethasone   PLAN FOR NEXT SESSION: Progress as tolerable with strength and func. Balance   Mayson Sterbenz,ANGIE, PTA,  11/18/2023, 3:54 PM [facility] [address] [facility] [place], [place], [address] Phone: [phone]   Fax:  [phone]

## 2023-11-22 ENCOUNTER — Other Ambulatory Visit: Payer: Self-pay | Admitting: Family Medicine

## 2023-11-22 DIAGNOSIS — M25551 Pain in right hip: Secondary | ICD-10-CM

## 2023-11-22 DIAGNOSIS — M5440 Lumbago with sciatica, unspecified side: Secondary | ICD-10-CM

## 2023-11-22 DIAGNOSIS — G8929 Other chronic pain: Secondary | ICD-10-CM

## 2023-11-22 DIAGNOSIS — M159 Polyosteoarthritis, unspecified: Secondary | ICD-10-CM

## 2023-11-22 MED ORDER — HYDROCODONE-ACETAMINOPHEN 7.5-325 MG PO TABS: 1.0000 | ORAL_TABLET | Freq: Three times a day (TID) | ORAL | 0 refills | Status: DC | PRN

## 2023-11-22 NOTE — Telephone Encounter (Signed)
 Requesting: Norco 7.5-325 Contract: N/A UDS:01/28/2023 Last Visit:08/09/2023 Next Visit:N/A Last Refill:11/02/2023  Please Advise

## 2023-11-22 NOTE — Telephone Encounter (Signed)
 Copied from CRM [id number]. Topic: Clinical - Medication Refill >> Nov 22, 2023 10:26 AM [place] J wrote: Medication: HYDROcodone -acetaminophen  (NORCO) 7.5-325 MG tablet  Has the patient contacted their pharmacy? No (Agent: If no, request that the patient contact the pharmacy for the refill. If patient does not wish to contact the pharmacy document the reason why and proceed with request.) (Agent: If yes, when and what did the pharmacy advise?) Pharmacy would not refill due to having no refills. This is the patient's preferred pharmacy:   [facility] [id number] [place], [place] - [address] [place] [address] Phone: [phone] Fax: [phone]  Is this the correct pharmacy for this prescription? Yes If no, delete pharmacy and type the correct one.   Has the prescription been filled recently? No  Is the patient out of the medication? No  Has the patient been seen for an appointment in the last year OR does the patient have an upcoming appointment? Yes  Can we respond through MyChart? Yes  Agent: Please be advised that Rx refills may take up to 3 business days. We ask that you follow-up with your pharmacy.

## 2023-11-23 ENCOUNTER — Ambulatory Visit: Admitting: Physical Therapy

## 2023-11-24 ENCOUNTER — Ambulatory Visit: Admitting: Obstetrics & Gynecology

## 2023-11-30 ENCOUNTER — Ambulatory Visit: Attending: Family Medicine | Admitting: Physical Therapy

## 2023-11-30 DIAGNOSIS — R293 Abnormal posture: Secondary | ICD-10-CM | POA: Insufficient documentation

## 2023-11-30 DIAGNOSIS — G8929 Other chronic pain: Secondary | ICD-10-CM | POA: Diagnosis not present

## 2023-11-30 DIAGNOSIS — M5441 Lumbago with sciatica, right side: Secondary | ICD-10-CM | POA: Insufficient documentation

## 2023-11-30 DIAGNOSIS — M5442 Lumbago with sciatica, left side: Secondary | ICD-10-CM | POA: Diagnosis not present

## 2023-11-30 DIAGNOSIS — R262 Difficulty in walking, not elsewhere classified: Secondary | ICD-10-CM | POA: Diagnosis not present

## 2023-11-30 NOTE — Therapy (Signed)
 OUTPATIENT PHYSICAL THERAPY LOWER EXTREMITY    Patient Name: Margaret [name] MRN: [id number] DOB:02/12/1941, 83 y.o., female Today's Date: 11/30/2023  END OF SESSION:  PT End of Session - 11/30/23 1359     Visit Number 11    Date for PT Re-Evaluation 12/13/23    PT Start Time 1355    PT Stop Time 1440    PT Time Calculation (min) 45 min           Past Medical History:  Diagnosis Date   Anxiety    Arthritis    Cancer (HCC)    lt. foot melanoma   Chronic kidney disease    lt. kidney cyst   Coronary artery disease    GERD (gastroesophageal reflux disease)    History of healed stress fracture 2013   bilateral feet   Hx of phlebitis    reports remote hx of superfical blood clots never anticoagulated   Hypertension    Osteopenia    Pneumonia    Past Surgical History:  Procedure Laterality Date   CATARACT EXTRACTION W/ INTRAOCULAR LENS IMPLANT Bilateral    CHOLECYSTECTOMY N/A 09/19/2016   Procedure: LAPAROSCOPIC CHOLECYSTECTOMY POSSIBLE INTRAOPERATIVE CHOLANGIOGRAM;  Surgeon: Donnice Bury, MD;  Location: [facility] OR;  Service: General;  Laterality: N/A;   ERCP N/A 09/18/2016   Procedure: ENDOSCOPIC RETROGRADE CHOLANGIOPANCREATOGRAPHY (ERCP);  Surgeon: Victory LITTIE Legrand DOUGLAS, MD;  Location: [facility] ENDOSCOPY;  Service: Endoscopy;  Laterality: N/A;   FOOT SURGERY     patient reports four foot surgeries   FOOT SURGERY Left    bunion, hammer toe surgery   FOOT SURGERY Right    shaved bunion, hammer toe correction   HERNIA REPAIR     KNEE ARTHROSCOPY Right 06/02/1995   REPLACEMENT TOTAL KNEE Left    REPLACEMENT TOTAL KNEE Left 10/30/2012   RETINAL DETACHMENT SURGERY     RETINAL DETACHMENT SURGERY Left 06/01/2006   ROBOTIC ASSITED PARTIAL NEPHRECTOMY Left 06/03/2022   Procedure: XI ROBOTIC ASSITED PARTIAL NEPHRECTOMY WITH ULTRASOUND AND VENTRAL HERNIA REPAIR;  Surgeon: Alvaro Hummer, MD;  Location: [facility] ORS;  Service: Urology;  Laterality: Left;  3 HRS   THYROIDECTOMY, PARTIAL  Right    THYROIDECTOMY, PARTIAL  06/02/2003   TOTAL KNEE ARTHROPLASTY Right 11/13/2019   Procedure: TOTAL KNEE ARTHROPLASTY SDDC;  Surgeon: Melodi Lerner, MD;  Location: [facility] ORS;  Service: Orthopedics;  Laterality: Right;    Patient Active Problem List   Diagnosis Date Noted   Renal mass 06/03/2022   Open wound of left side of back 01/20/2021   [place] 11/18/2020   Lipoma of back 11/18/2020   Lymphedema 11/18/2020   Melanocytic nevi of right upper limb, including shoulder 11/18/2020   Personal history of malignant melanoma of skin 11/18/2020   Sebaceous cyst 11/18/2020   Seborrheic dermatitis 11/18/2020   Pre-diabetes 04/03/2020   OA (osteoarthritis) of knee 11/13/2019   Total knee replacement status, 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   Pain in right knee 08/03/2019   Fluid collection at surgical site 10/20/2018   Encounter for postoperative examination after surgery for malignant neoplasm 09/30/2018   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/2018   Plantar fasciitis of right foot 02/03/2017   Porokeratosis 02/03/2017   Acute gallstone pancreatitis    Calculus of gallbladder with acute cholecystitis and obstruction    LFT elevation    Stress fracture 01/01/2016   Insomnia 12/16/2015   Peripheral neuropathy 10/18/2015   Renal insufficiency 03/06/2015   HTN (hypertension) 03/06/2015   Osteopenia 03/06/2015   GERD (  gastroesophageal reflux disease) 03/06/2015   Vaginal pessary present 03/06/2015   Metatarsalgia 07/13/2012   Increased frequency of urination 04/13/2012   DDD (degenerative disc disease), cervical 01/19/2012   Knee stiffness 01/19/2012   Low back pain 01/19/2012   Neck pain 01/19/2012   Pain of hand 01/19/2012   Hallux valgus 09/20/2011   Hyperkeratosis 08/13/2011   Anxiety disorder 07/06/2011   Depression 07/06/2011   Diverticulosis of large intestine 07/06/2011   Non-toxic multinodular goiter 07/06/2011   Atrophic vaginitis 10/24/2010   Dermatographic urticaria  09/17/2010   Inflamed seborrheic keratosis 09/17/2010   Closed fracture of fifth metatarsal bone 07/10/2010   Diagnosis unknown 02/27/2010   Hip pain 06/08/2008   Pulmonary nodule seen on imaging study 06/08/2008    PCP: Watt Raisin, MD  REFERRING PROVIDER: Charlie Dolores, MD, [facility]  REFERRING DIAG: degeneration of discs of lumbar spine  THERAPY DIAG:  Chronic bilateral low back pain with right-sided sciatica  Chronic bilateral low back pain with left-sided sciatica  Difficulty in walking, not elsewhere classified  Posture imbalance  Rationale for Evaluation and Treatment: Rehabilitation  ONSET DATE: chronic  SUBJECTIVE:   SUBJECTIVE STATEMENT:  drove today by myself, amb in without AD    PERTINENT HISTORY: Saw orthopedist, took prednisone, which didn't help, orthopedist may want to do MRI, referred to PT by both PCP and orthopedist, also B degenerative arthritis hips PAIN:  Are you having pain? 5/10  PRECAUTIONS: Fall  RED FLAGS: None   WEIGHT BEARING RESTRICTIONS: No  FALLS:  Has patient fallen in last 6 months? Yes. Number of falls several  LIVING ENVIRONMENT: Lives with: lives with their family and lives with their daughter Lives in: House/apartment Stairs: Yes: Internal: one flight steps; on left going up Has following equipment at home: Environmental consultant - 4 wheeled  OCCUPATION: retired  PLOF: Independent and Independent with basic ADLs  PATIENT GOALS: reduce pain hips and lower back so I can tolerate more activity  NEXT MD VISIT: not scheduled yet  OBJECTIVE:  Note: Objective measures were completed at Evaluation unless otherwise noted.  DIAGNOSTIC FINDINGS: X-ray done 11/23; IMPRESSION: 1. 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 2. Lower lumbar spondylosis.  PATIENT SURVEYS:  Modified Oswestry Modified Oswestry Low Back Pain Disability Questionnaire: 29 / 50 = 58.0 %   COGNITION: Overall cognitive  status: Within functional limits for tasks assessed     SENSATION: Light touch: WFL  EDEMA:  None noted   MUSCLE LENGTH: Hamstrings: Right wfl deg; Left wfl deg Debby test: Right wfl deg; Left wfl deg  POSTURE: scoliotic posture noted concavity L lumbar, pronounced thoracic kyphosis  PALPATION: Not tender with light palpation B gr trochanters hips  LOWER EXTREMITY ROM:B hip ROM wnl, able to achieve tailor position each leg in sitting  LUMBAR ROM: FB limited 15%, also risk of falling pt needed UE support Lumbar ext, 25% with marked side bending to L with this motion SB B 35% B with central lower back pain LOWER EXTREMITY MMT:  MMT Right eval Left eval  Hip flexion    Hip extension    Hip abduction    Hip adduction    Hip internal rotation    Hip external rotation    Knee flexion    Knee extension    Ankle dorsiflexion    Ankle plantarflexion    Ankle inversion    Ankle eversion     (Blank rows = not tested)   FUNCTIONAL TESTS:  5 times sit to stand: 25 sec, required mod assist of PT to avoid falling backwards into chair with final attempt  GAIT: Distance walked: in clinic up to 60  Assistive device utilized: Single point cane, Walker - 2 wheeled, and none Level of assistance: Min A without device and with cane Comments: very unstable without device                                                                                                                                 TREATMENT DATE:   11/30/23 Amb 3 laps ( about 125 feet each) no AD 2 min 20 sec,last lap very fatigued and wobbly Standing red tband row and shld ext 2 sets 10 Seated trunk ext 2 sets 10 Nustep L 5 Green tband HS HS curl 2 sets 10 3# LAQ 2 sets 10 Standing HHA - 3# alt marching 20 x,hip flex SL 10 x BIL,hip abd 10 x BIL,hip ext 10x BIL ADD ball squeeze 20 x    11/18/23 Supine core stab ex on MH Red tband hip flex bent knee,clams 2 sets 10 ADD ball squeeze 2 sets 10 SLR 2  sets 10 PROM LE and trunk Seated red tband shld ext and row 2 seated rows Resisted trunk ext 2 sets10 Red tband LAQ 2 sets 10 Green tband HS HS curl 2 sets 10  11/16/23 Car transfer mod I Mod I getting walker in and out of car Nustep L 5 20# resisted gait fwd and backward 5 x, 3 x each side  HHA 3# LAQ 2 sets 10 Green tband HS curl 2 sets 10 Green tband hip abd 2 sets 10 Green tband hip flexion 2 sets 10 Resisted seated trunk ext 2 sets 10  11/11/23 Nustep L 5 Ball toss on airex min A,instability but no LOB Standing on airex V+CGA red tband shld ext and row 10 x STS elevated mat on airex 5 x cuing CG-min ,posterior lean 2 sets Standing on airex HHA 2# LE ex- marching,SL hip flex,ext and abd Simulated folding walker and sliding into car CG-min A with cuing Green tband HS curl 2 sets 10 Red tband LAQ 2 sets 10    11/04/23  Nustep L 5 8 min Black tband trunk ext 2 sets10, flexion green tband 2 sets 10 Seated row 15 # 2 sets 10 STS 10 x without UE 3# LAQ,hip flex and abd 2 sets 10-cued to not rock body and engage core Seated 3# dumbbell ,uptall-core engaged chest press 15 x then OH 10 x 3# LE HHA marching fwd,back and laterally Green tband HS curl 2 sets 10     11/02/23 L 5 Looked over MRI with pt and I explained and answered questions. Educ on stenosis and need to strengthen to support spine and we also discussed injection in spine Gait training with SPC and HHA-did fair,strongly rec use of RW for safety and to  help reduce strain on back Black tband trunk ext 2 sets 10 Seated 3# dumbbell ,uptall-core engaged chest press 15 x then OH 10 x Standing CG A shld ext and row 2 sets 10 yellow tband Sitting on dyna disc for increased lumb stab- 3# LAQ,hip flex,hip abd 2 sets 10,Green tband HS curl 2 sets 10 HHA 3# marching fwd and abck 15 feet 2 x Then side stepping  10/26/23 Nustep L 5 8 min Sitting on dyna disc pelvic ROM 4 ways 10 x, LAQ,hip flex and hip abd 10  x each,red tband scap stab shld ext and row Supine feet on ball bridge,KTC,obl and isometric abdominals MEch Lumb traction 40# 15 min with [facility]   10/19/23 Nustep L 5 Mech lumbar traction with MH 15 min static 30# then increase to 35# after 5 min Supine ppt 10 x. Supine modified small bridge 10x Supine clams 10 x manual resistance Supine ADD ball squeeze manual resistance Supine hip flexion Isometric abdominals   10/12/23 2# LE seated LAQ,hip flex, hip abd Red tband row and shld ext 2 sets 10 seated Green tband trunk ext  2 sets 10 Standing lateral hip f=shift 10 x each side Standing 3# trunk rotation Standing 3 # lateral trunk flexion 10 x Nustep L 1 6 min HHA side stepping 8 feet 2 x each HHA marching fwd and backward 8 feet 2 x     10/06/23: Min assist to walk from lobby to clinic, and from clinic to lobby, unsteady antalgic L, flexed posture trunk, hips Nustep, UE's and LE's , level 1 x 6 min  Seated for hamstring curls with yellow t band 15 x each leg Seated hip add isometric with ball 5 sec holds 15 reps Seated rows with yellow t band 15 reps Supine for therapist assisted stretching each hip, flex, ext, rotation gentle  Attempted B knee to chest but too painful.   09/20/23:   Evaluation, explained to pt that long term management of arthritic changes in hips in spine, should respond well to moderate exercise. Practiced gait with st cane, pt without any marked improvement in her stability with the use of the cane.  Therefore practiced with front wheeled walker, marked improvement in gait pattern . Recommended that she use a front wheeled walker which is more light weight and easier to maneuver in and out of car for outings, she is a high fall risk.  PATIENT EDUCATION:  Education details: POC, goals Person educated: Patient Education method: Explanation, Demonstration, Tactile cues, and Verbal cues Education comprehension: verbalized understanding and returned  demonstration  HOME EXERCISE PROGRAM: Green t band hip abd/ER in sitting and green t band ankle pumps L .   ASSESSMENT:  CLINICAL IMPRESSION:  goals assessed and documented. Pt arrives today walking in without AD ,up talker and drove in. Focus session on walking, LE and postural strength and postural education with ex. Pt is making good progress with skilled interventions   OBJECTIVE IMPAIRMENTS: decreased activity tolerance, decreased balance, decreased knowledge of condition, decreased knowledge of use of DME, decreased mobility, difficulty walking, decreased ROM, decreased strength, decreased safety awareness, impaired perceived functional ability, postural dysfunction, and pain.   ACTIVITY LIMITATIONS: carrying, lifting, bending, standing, squatting, transfers, bed mobility, and locomotion level  PARTICIPATION LIMITATIONS: meal prep, cleaning, laundry, shopping, community activity, and yard work  PERSONAL FACTORS: Age, Behavior pattern, Fitness, Past/current experiences, Time since onset of injury/illness/exacerbation, and 1-2 comorbidities: severe DJD B hips, lumbar region, neuropathy are also affecting patient's functional  outcome.   REHAB POTENTIAL: Good  CLINICAL DECISION MAKING: Evolving/moderate complexity  EVALUATION COMPLEXITY: Moderate   GOALS: Goals reviewed with patient? Yes  SHORT TERM GOALS: Target date: 2 weeks 5 5/25 I HEP Baseline: Goal status: MET 10/26/23   LONG TERM GOALS: Target date: 12 weeks, 12/13/23  Modified Oswestry Low Back Pain Disability Questionnaire: 29 / 50 = 58.0 %  Baseline:  Goal status: 11/11/23 progressing  and 11/18/23  11/30/23 progressing  2.  Safe gait with appropriate device , over 350' for safe household distances, without balance loss Baseline:  Goal status: on going 10/26/23  progressing 11/04/23  and 11/11/23  Progressing 11/18/23  progressing 11/30/23 distance met but unsteady  3.  5 x sit to stand 14.8 sec or less without balance  loss Baseline: 25 sec,with UE support B  required mod assist on final rep to avoid fall  Goal status: progressing 10/26/23  progressing 11/05/23  11/18/23 multi tries and posterior lean,progressing  11/30/23 LOB progressing    PLAN:  PT FREQUENCY: 2x/week  PT DURATION: 12 weeks  PLANNED INTERVENTIONS: 97110-Therapeutic exercises, 97530- Therapeutic activity, 97112- Neuromuscular re-education, 97535- Self Care, [id number]- Manual therapy, [id number]- Electrical stimulation (unattended), and 97033- Ionotophoresis 4mg /ml Dexamethasone   PLAN FOR NEXT SESSION: Progress as tolerable with strength and func. Balance   Yamina Lenis,ANGIE, PTA,  11/30/2023, 1:59 PM [facility]. [place], [place], [address] Phone: [phone]   Fax:  [phone]

## 2023-12-02 ENCOUNTER — Ambulatory Visit: Admitting: Physical Therapy

## 2023-12-02 ENCOUNTER — Telehealth: Payer: Self-pay | Admitting: Family Medicine

## 2023-12-02 DIAGNOSIS — M5442 Lumbago with sciatica, left side: Secondary | ICD-10-CM | POA: Diagnosis not present

## 2023-12-02 DIAGNOSIS — R262 Difficulty in walking, not elsewhere classified: Secondary | ICD-10-CM | POA: Diagnosis not present

## 2023-12-02 DIAGNOSIS — G8929 Other chronic pain: Secondary | ICD-10-CM

## 2023-12-02 DIAGNOSIS — R293 Abnormal posture: Secondary | ICD-10-CM | POA: Diagnosis not present

## 2023-12-02 DIAGNOSIS — M5441 Lumbago with sciatica, right side: Secondary | ICD-10-CM | POA: Diagnosis not present

## 2023-12-02 NOTE — Therapy (Signed)
 OUTPATIENT PHYSICAL THERAPY LOWER EXTREMITY    Patient Name: Margaret [name] MRN: [id number] DOB:[birth year], 83 y.o., female Today's Date: 12/02/2023  END OF SESSION:  PT End of Session - 12/02/23 1012     Visit Number 12    Date for PT Re-Evaluation 12/13/23    PT Start Time 1012    PT Stop Time 1055    PT Time Calculation (min) 43 min           Past Medical History:  Diagnosis Date   Anxiety    Arthritis    Cancer (HCC)    lt. foot melanoma   Chronic kidney disease    lt. kidney cyst   Coronary artery disease    GERD (gastroesophageal reflux disease)    History of healed stress fracture 2013   bilateral feet   Hx of phlebitis    reports remote hx of superfical blood clots never anticoagulated   Hypertension    Osteopenia    Pneumonia    Past Surgical History:  Procedure Laterality Date   CATARACT EXTRACTION W/ INTRAOCULAR LENS IMPLANT Bilateral    CHOLECYSTECTOMY N/A 09/19/2016   Procedure: LAPAROSCOPIC CHOLECYSTECTOMY POSSIBLE INTRAOPERATIVE CHOLANGIOGRAM;  Surgeon: Donnice Bury, MD;  Location: [facility] OR;  Service: General;  Laterality: N/A;   ERCP N/A 09/18/2016   Procedure: ENDOSCOPIC RETROGRADE CHOLANGIOPANCREATOGRAPHY (ERCP);  Surgeon: Victory LITTIE Legrand DOUGLAS, MD;  Location: [facility] ENDOSCOPY;  Service: Endoscopy;  Laterality: N/A;   FOOT SURGERY     patient reports four foot surgeries   FOOT SURGERY Left    bunion, hammer toe surgery   FOOT SURGERY Right    shaved bunion, hammer toe correction   HERNIA REPAIR     KNEE ARTHROSCOPY Right 06/02/1995   REPLACEMENT TOTAL KNEE Left    REPLACEMENT TOTAL KNEE Left 10/30/2012   RETINAL DETACHMENT SURGERY     RETINAL DETACHMENT SURGERY Left 06/01/2006   ROBOTIC ASSITED PARTIAL NEPHRECTOMY Left 06/03/2022   Procedure: XI ROBOTIC ASSITED PARTIAL NEPHRECTOMY WITH ULTRASOUND AND VENTRAL HERNIA REPAIR;  Surgeon: Alvaro Hummer, MD;  Location: [facility] ORS;  Service: Urology;  Laterality: Left;  3 HRS   THYROIDECTOMY, PARTIAL  Right    THYROIDECTOMY, PARTIAL  06/02/2003   TOTAL KNEE ARTHROPLASTY Right 11/13/2019   Procedure: TOTAL KNEE ARTHROPLASTY SDDC;  Surgeon: Melodi Lerner, MD;  Location: [facility] ORS;  Service: Orthopedics;  Laterality: Right;    Patient Active Problem List   Diagnosis Date Noted   Renal mass 06/03/2022   Open wound of left side of back 01/20/2021   [place] 11/18/2020   Lipoma of back 11/18/2020   Lymphedema 11/18/2020   Melanocytic nevi of right upper limb, including shoulder 11/18/2020   Personal history of malignant melanoma of skin 11/18/2020   Sebaceous cyst 11/18/2020   Seborrheic dermatitis 11/18/2020   Pre-diabetes 04/03/2020   OA (osteoarthritis) of knee 11/13/2019   Total knee replacement status, 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   Pain in right knee 08/03/2019   Fluid collection at surgical site 10/20/2018   Encounter for postoperative examination after surgery for malignant neoplasm 09/30/2018   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/2018   Plantar fasciitis of right foot 02/03/2017   Porokeratosis 02/03/2017   Acute gallstone pancreatitis    Calculus of gallbladder with acute cholecystitis and obstruction    LFT elevation    Stress fracture 01/01/2016   Insomnia 12/16/2015   Peripheral neuropathy 10/18/2015   Renal insufficiency 03/06/2015   HTN (hypertension) 03/06/2015   Osteopenia 03/06/2015   GERD (  gastroesophageal reflux disease) 03/06/2015   Vaginal pessary present 03/06/2015   Metatarsalgia 07/13/2012   Increased frequency of urination 04/13/2012   DDD (degenerative disc disease), cervical 01/19/2012   Knee stiffness 01/19/2012   Low back pain 01/19/2012   Neck pain 01/19/2012   Pain of hand 01/19/2012   Hallux valgus 09/20/2011   Hyperkeratosis 08/13/2011   Anxiety disorder 07/06/2011   Depression 07/06/2011   Diverticulosis of large intestine 07/06/2011   Non-toxic multinodular goiter 07/06/2011   Atrophic vaginitis 10/24/2010   Dermatographic urticaria  09/17/2010   Inflamed seborrheic keratosis 09/17/2010   Closed fracture of fifth metatarsal bone 07/10/2010   Diagnosis unknown 02/27/2010   Hip pain 06/08/2008   Pulmonary nodule seen on imaging study 06/08/2008    PCP: Watt Raisin, MD  REFERRING PROVIDER: Charlie Dolores, MD, [facility]  REFERRING DIAG: degeneration of discs of lumbar spine  THERAPY DIAG:  Chronic bilateral low back pain with right-sided sciatica  Difficulty in walking, not elsewhere classified  Posture imbalance  Rationale for Evaluation and Treatment: Rehabilitation  ONSET DATE: chronic  SUBJECTIVE:   SUBJECTIVE STATEMENT:  drove today by myself, amb in with RW c/o increased LBP and did not sleep well    PERTINENT HISTORY: Saw orthopedist, took prednisone, which didn't help, orthopedist may want to do MRI, referred to PT by both PCP and orthopedist, also B degenerative arthritis hips PAIN:  Are you having pain? 5/10  PRECAUTIONS: Fall  RED FLAGS: None   WEIGHT BEARING RESTRICTIONS: No  FALLS:  Has patient fallen in last 6 months? Yes. Number of falls several  LIVING ENVIRONMENT: Lives with: lives with their family and lives with their daughter Lives in: House/apartment Stairs: Yes: Internal: one flight steps; on left going up Has following equipment at home: Environmental consultant - 4 wheeled  OCCUPATION: retired  PLOF: Independent and Independent with basic ADLs  PATIENT GOALS: reduce pain hips and lower back so I can tolerate more activity  NEXT MD VISIT: not scheduled yet  OBJECTIVE:  Note: Objective measures were completed at Evaluation unless otherwise noted.  DIAGNOSTIC FINDINGS: X-ray done 11/23; IMPRESSION: 1. 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 2. Lower lumbar spondylosis.  PATIENT SURVEYS:  Modified Oswestry Modified Oswestry Low Back Pain Disability Questionnaire: 29 / 50 = 58.0 %   COGNITION: Overall cognitive status: Within  functional limits for tasks assessed     SENSATION: Light touch: WFL  EDEMA:  None noted   MUSCLE LENGTH: Hamstrings: Right wfl deg; Left wfl deg Debby test: Right wfl deg; Left wfl deg  POSTURE: scoliotic posture noted concavity L lumbar, pronounced thoracic kyphosis  PALPATION: Not tender with light palpation B gr trochanters hips  LOWER EXTREMITY ROM:B hip ROM wnl, able to achieve tailor position each leg in sitting  LUMBAR ROM: FB limited 15%, also risk of falling pt needed UE support Lumbar ext, 25% with marked side bending to L with this motion SB B 35% B with central lower back pain LOWER EXTREMITY MMT:  MMT Right eval Left eval  Hip flexion    Hip extension    Hip abduction    Hip adduction    Hip internal rotation    Hip external rotation    Knee flexion    Knee extension    Ankle dorsiflexion    Ankle plantarflexion    Ankle inversion    Ankle eversion     (Blank rows = not tested)   FUNCTIONAL TESTS:  5  times sit to stand: 25 sec, required mod assist of PT to avoid falling backwards into chair with final attempt  GAIT: Distance walked: in clinic up to 60  Assistive device utilized: Single point cane, Walker - 2 wheeled, and none Level of assistance: Min A without device and with cane Comments: very unstable without device                                                                                                                                 TREATMENT DATE:   12/02/23 Nustep L 5 BM for vacuuming to decrease strain on back Green tband HS curl,hip flex and clams seated 2 sets 10 ADD ball squeeze 2 sets 10 3# LAQ 2 sets 10 Red tband seated row and shld ext 2 sets 10 Tband trunk ext 2 sets 10 Standing with RW 3# hip ext and 10 x each     11/30/23 Amb 3 laps ( about 125 feet each) no AD 2 min 20 sec,last lap very fatigued and wobbly Standing red tband row and shld ext 2 sets 10 Seated trunk ext 2 sets 10 Nustep L 5 Green  tband HS HS curl 2 sets 10 3# LAQ 2 sets 10 Standing HHA - 3# alt marching 20 x,hip flex SL 10 x BIL,hip abd 10 x BIL,hip ext 10x BIL ADD ball squeeze 20 x    11/18/23 Supine core stab ex on MH Red tband hip flex bent knee,clams 2 sets 10 ADD ball squeeze 2 sets 10 SLR 2 sets 10 PROM LE and trunk Seated red tband shld ext and row 2 seated rows Resisted trunk ext 2 sets10 Red tband LAQ 2 sets 10 Green tband HS HS curl 2 sets 10  11/16/23 Car transfer mod I Mod I getting walker in and out of car Nustep L 5 20# resisted gait fwd and backward 5 x, 3 x each side  HHA 3# LAQ 2 sets 10 Green tband HS curl 2 sets 10 Green tband hip abd 2 sets 10 Green tband hip flexion 2 sets 10 Resisted seated trunk ext 2 sets 10  11/11/23 Nustep L 5 Ball toss on airex min A,instability but no LOB Standing on airex V+CGA red tband shld ext and row 10 x STS elevated mat on airex 5 x cuing CG-min ,posterior lean 2 sets Standing on airex HHA 2# LE ex- marching,SL hip flex,ext and abd Simulated folding walker and sliding into car CG-min A with cuing Green tband HS curl 2 sets 10 Red tband LAQ 2 sets 10    11/04/23  Nustep L 5 8 min Black tband trunk ext 2 sets10, flexion green tband 2 sets 10 Seated row 15 # 2 sets 10 STS 10 x without UE 3# LAQ,hip flex and abd 2 sets 10-cued to not rock body and engage core Seated 3# dumbbell ,uptall-core engaged chest press 15 x then OH 10 x 3#  LE HHA marching fwd,back and laterally Green tband HS curl 2 sets 10     11/02/23 L 5 Looked over MRI with pt and I explained and answered questions. Educ on stenosis and need to strengthen to support spine and we also discussed injection in spine Gait training with SPC and HHA-did fair,strongly rec use of RW for safety and to help reduce strain on back Black tband trunk ext 2 sets 10 Seated 3# dumbbell ,uptall-core engaged chest press 15 x then OH 10 x Standing CG A shld ext and row 2 sets 10  yellow tband Sitting on dyna disc for increased lumb stab- 3# LAQ,hip flex,hip abd 2 sets 10,Green tband HS curl 2 sets 10 HHA 3# marching fwd and abck 15 feet 2 x Then side stepping  10/26/23 Nustep L 5 8 min Sitting on dyna disc pelvic ROM 4 ways 10 x, LAQ,hip flex and hip abd 10 x each,red tband scap stab shld ext and row Supine feet on ball bridge,KTC,obl and isometric abdominals MEch Lumb traction 40# 15 min with [facility]   10/19/23 Nustep L 5 Mech lumbar traction with MH 15 min static 30# then increase to 35# after 5 min Supine ppt 10 x. Supine modified small bridge 10x Supine clams 10 x manual resistance Supine ADD ball squeeze manual resistance Supine hip flexion Isometric abdominals   10/12/23 2# LE seated LAQ,hip flex, hip abd Red tband row and shld ext 2 sets 10 seated Green tband trunk ext  2 sets 10 Standing lateral hip f=shift 10 x each side Standing 3# trunk rotation Standing 3 # lateral trunk flexion 10 x Nustep L 1 6 min HHA side stepping 8 feet 2 x each HHA marching fwd and backward 8 feet 2 x     10/06/23: Min assist to walk from lobby to clinic, and from clinic to lobby, unsteady antalgic L, flexed posture trunk, hips Nustep, UE's and LE's , level 1 x 6 min  Seated for hamstring curls with yellow t band 15 x each leg Seated hip add isometric with ball 5 sec holds 15 reps Seated rows with yellow t band 15 reps Supine for therapist assisted stretching each hip, flex, ext, rotation gentle  Attempted B knee to chest but too painful.   09/20/23:   Evaluation, explained to pt that long term management of arthritic changes in hips in spine, should respond well to moderate exercise. Practiced gait with st cane, pt without any marked improvement in her stability with the use of the cane.  Therefore practiced with front wheeled walker, marked improvement in gait pattern . Recommended that she use a front wheeled walker which is more light weight and easier to  maneuver in and out of car for outings, she is a high fall risk.  PATIENT EDUCATION:  Education details: POC, goals Person educated: Patient Education method: Explanation, Demonstration, Tactile cues, and Verbal cues Education comprehension: verbalized understanding and returned demonstration  HOME EXERCISE PROGRAM: Green t band hip abd/ER in sitting and green t band ankle pumps L .   ASSESSMENT:  CLINICAL IMPRESSION:  pt drove herself again today but amb in with RW d/t increased LBP and states she did not sleep well. Progressed slowly and as tolerated with gentle ex and ROM with cuing and rest as needed.Pt also mentioned she felt good yesterday and probably overdid-cleaning. Educ on spacing out task and educ on mech for vacuuming to decrease strain.   OBJECTIVE IMPAIRMENTS: decreased activity tolerance, decreased balance,  decreased knowledge of condition, decreased knowledge of use of DME, decreased mobility, difficulty walking, decreased ROM, decreased strength, decreased safety awareness, impaired perceived functional ability, postural dysfunction, and pain.   ACTIVITY LIMITATIONS: carrying, lifting, bending, standing, squatting, transfers, bed mobility, and locomotion level  PARTICIPATION LIMITATIONS: meal prep, cleaning, laundry, shopping, community activity, and yard work  PERSONAL FACTORS: Age, Behavior pattern, Fitness, Past/current experiences, Time since onset of injury/illness/exacerbation, and 1-2 comorbidities: severe DJD B hips, lumbar region, neuropathy are also affecting patient's functional outcome.   REHAB POTENTIAL: Good  CLINICAL DECISION MAKING: Evolving/moderate complexity  EVALUATION COMPLEXITY: Moderate   GOALS: Goals reviewed with patient? Yes  SHORT TERM GOALS: Target date: 2 weeks 5 5/25 I HEP Baseline: Goal status: MET 10/26/23   LONG TERM GOALS: Target date: 12 weeks, 12/13/23  Modified Oswestry Low Back Pain Disability Questionnaire: 29 / 50 =  58.0 %  Baseline:  Goal status: 11/11/23 progressing  and 11/18/23  11/30/23 progressing  2.  Safe gait with appropriate device , over 350' for safe household distances, without balance loss Baseline:  Goal status: on going 10/26/23  progressing 11/04/23  and 11/11/23  Progressing 11/18/23  progressing 11/30/23 distance met but unsteady  3.  5 x sit to stand 14.8 sec or less without balance loss Baseline: 25 sec,with UE support B  required mod assist on final rep to avoid fall  Goal status: progressing 10/26/23  progressing 11/05/23  11/18/23 multi tries and posterior lean,progressing  11/30/23 LOB progressing    PLAN:  PT FREQUENCY: 2x/week  PT DURATION: 12 weeks  PLANNED INTERVENTIONS: 97110-Therapeutic exercises, 97530- Therapeutic activity, 97112- Neuromuscular re-education, 97535- Self Care, 02859- Manual therapy, G0283- Electrical stimulation (unattended), and 97033- Ionotophoresis 4mg /ml Dexamethasone   PLAN FOR NEXT SESSION: Progress as tolerable with strength and func. Balance   Juandedios Dudash,ANGIE, PTA,  12/02/2023, 10:13 AM [facility] at [facility]. [place], [place], [address] Phone: [phone]   Fax:  [phone]

## 2023-12-02 NOTE — Telephone Encounter (Signed)
 Copied from CRM [id number]. Topic: Medicare AWV >> Dec 02, 2023 11:16 AM Nathanel DEL wrote: Reason for CRM: LVM 12/02/2023 to schedule AWV. Please schedule Virtual or Telehealth visits ONLY.   Nathanel Paschal; Care Guide Ambulatory Clinical Support [place] [facility] Direct Dial: [phone]

## 2023-12-06 ENCOUNTER — Other Ambulatory Visit (HOSPITAL_COMMUNITY): Payer: Self-pay | Admitting: Urology

## 2023-12-06 ENCOUNTER — Ambulatory Visit (HOSPITAL_COMMUNITY)
Admission: RE | Admit: 2023-12-06 | Discharge: 2023-12-06 | Disposition: A | Discharge status: Home / Self Care | Source: Ambulatory Visit | Attending: Urology | Admitting: Urology

## 2023-12-06 DIAGNOSIS — C642 Malignant neoplasm of left kidney, except renal pelvis: Secondary | ICD-10-CM | POA: Diagnosis not present

## 2023-12-06 DIAGNOSIS — I771 Stricture of artery: Secondary | ICD-10-CM | POA: Diagnosis not present

## 2023-12-06 DIAGNOSIS — I10 Essential (primary) hypertension: Secondary | ICD-10-CM | POA: Diagnosis not present

## 2023-12-06 DIAGNOSIS — I700 Atherosclerosis of aorta: Secondary | ICD-10-CM | POA: Diagnosis not present

## 2023-12-07 ENCOUNTER — Ambulatory Visit: Admitting: Physical Therapy

## 2023-12-07 DIAGNOSIS — G8929 Other chronic pain: Secondary | ICD-10-CM | POA: Diagnosis not present

## 2023-12-07 DIAGNOSIS — M5441 Lumbago with sciatica, right side: Secondary | ICD-10-CM | POA: Diagnosis not present

## 2023-12-07 DIAGNOSIS — M5442 Lumbago with sciatica, left side: Secondary | ICD-10-CM | POA: Diagnosis not present

## 2023-12-07 DIAGNOSIS — R262 Difficulty in walking, not elsewhere classified: Secondary | ICD-10-CM | POA: Diagnosis not present

## 2023-12-07 DIAGNOSIS — R293 Abnormal posture: Secondary | ICD-10-CM | POA: Diagnosis not present

## 2023-12-07 NOTE — Therapy (Signed)
 OUTPATIENT PHYSICAL THERAPY LOWER EXTREMITY    Patient Name: Margaret [name] MRN: [id number] DOB:02-Mar-1941, 83 y.o., female Today's Date: 12/07/2023  END OF SESSION:  PT End of Session - 12/07/23 1401     Visit Number 13    Date for PT Re-Evaluation 12/13/23    PT Start Time 1400    PT Stop Time 1445    PT Time Calculation (min) 45 min           Past Medical History:  Diagnosis Date   Anxiety    Arthritis    Cancer (HCC)    lt. foot melanoma   Chronic kidney disease    lt. kidney cyst   Coronary artery disease    GERD (gastroesophageal reflux disease)    History of healed stress fracture 2013   bilateral feet   Hx of phlebitis    reports remote hx of superfical blood clots never anticoagulated   Hypertension    Osteopenia    Pneumonia    Past Surgical History:  Procedure Laterality Date   CATARACT EXTRACTION W/ INTRAOCULAR LENS IMPLANT Bilateral    CHOLECYSTECTOMY N/A 09/19/2016   Procedure: LAPAROSCOPIC CHOLECYSTECTOMY POSSIBLE INTRAOPERATIVE CHOLANGIOGRAM;  Surgeon: Donnice Bury, MD;  Location: [facility] OR;  Service: General;  Laterality: N/A;   ERCP N/A 09/18/2016   Procedure: ENDOSCOPIC RETROGRADE CHOLANGIOPANCREATOGRAPHY (ERCP);  Surgeon: Victory LITTIE Legrand DOUGLAS, MD;  Location: [facility] ENDOSCOPY;  Service: Endoscopy;  Laterality: N/A;   FOOT SURGERY     patient reports four foot surgeries   FOOT SURGERY Left    bunion, hammer toe surgery   FOOT SURGERY Right    shaved bunion, hammer toe correction   HERNIA REPAIR     KNEE ARTHROSCOPY Right 06/02/1995   REPLACEMENT TOTAL KNEE Left    REPLACEMENT TOTAL KNEE Left 10/30/2012   RETINAL DETACHMENT SURGERY     RETINAL DETACHMENT SURGERY Left 06/01/2006   ROBOTIC ASSITED PARTIAL NEPHRECTOMY Left 06/03/2022   Procedure: XI ROBOTIC ASSITED PARTIAL NEPHRECTOMY WITH ULTRASOUND AND VENTRAL HERNIA REPAIR;  Surgeon: Alvaro Hummer, MD;  Location: [facility] ORS;  Service: Urology;  Laterality: Left;  3 HRS   THYROIDECTOMY, PARTIAL  Right    THYROIDECTOMY, PARTIAL  06/02/2003   TOTAL KNEE ARTHROPLASTY Right 11/13/2019   Procedure: TOTAL KNEE ARTHROPLASTY SDDC;  Surgeon: Melodi Lerner, MD;  Location: [facility] ORS;  Service: Orthopedics;  Laterality: Right;    Patient Active Problem List   Diagnosis Date Noted   Renal mass 06/03/2022   Open wound of left side of back 01/20/2021   [place] 11/18/2020   Lipoma of back 11/18/2020   Lymphedema 11/18/2020   Melanocytic nevi of right upper limb, including shoulder 11/18/2020   Personal history of malignant melanoma of skin 11/18/2020   Sebaceous cyst 11/18/2020   Seborrheic dermatitis 11/18/2020   Pre-diabetes 04/03/2020   OA (osteoarthritis) of knee 11/13/2019   Total knee replacement status, 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   Pain in right knee 08/03/2019   Fluid collection at surgical site 10/20/2018   Encounter for postoperative examination after surgery for malignant neoplasm 09/30/2018   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/2018   Plantar fasciitis of right foot 02/03/2017   Porokeratosis 02/03/2017   Acute gallstone pancreatitis    Calculus of gallbladder with acute cholecystitis and obstruction    LFT elevation    Stress fracture 01/01/2016   Insomnia 12/16/2015   Peripheral neuropathy 10/18/2015   Renal insufficiency 03/06/2015   HTN (hypertension) 03/06/2015   Osteopenia 03/06/2015   GERD (  gastroesophageal reflux disease) 03/06/2015   Vaginal pessary present 03/06/2015   Metatarsalgia 07/13/2012   Increased frequency of urination 04/13/2012   DDD (degenerative disc disease), cervical 01/19/2012   Knee stiffness 01/19/2012   Low back pain 01/19/2012   Neck pain 01/19/2012   Pain of hand 01/19/2012   Hallux valgus 09/20/2011   Hyperkeratosis 08/13/2011   Anxiety disorder 07/06/2011   Depression 07/06/2011   Diverticulosis of large intestine 07/06/2011   Non-toxic multinodular goiter 07/06/2011   Atrophic vaginitis 10/24/2010   Dermatographic urticaria  09/17/2010   Inflamed seborrheic keratosis 09/17/2010   Closed fracture of fifth metatarsal bone 07/10/2010   Diagnosis unknown 02/27/2010   Hip pain 06/08/2008   Pulmonary nodule seen on imaging study 06/08/2008    PCP: Watt Raisin, MD  REFERRING PROVIDER: Charlie Dolores, MD, [facility]  REFERRING DIAG: degeneration of discs of lumbar spine  THERAPY DIAG:  Chronic bilateral low back pain with right-sided sciatica  Difficulty in walking, not elsewhere classified  Posture imbalance  Chronic bilateral low back pain with left-sided sciatica  Rationale for Evaluation and Treatment: Rehabilitation  ONSET DATE: chronic  SUBJECTIVE:   SUBJECTIVE STATEMENT:  went to grocery and should not of done that right before PT. Hurting and worn out    PERTINENT HISTORY: Saw orthopedist, took prednisone, which didn't help, orthopedist may want to do MRI, referred to PT by both PCP and orthopedist, also B degenerative arthritis hips PAIN:  Are you having pain? 5/10  PRECAUTIONS: Fall  RED FLAGS: None   WEIGHT BEARING RESTRICTIONS: No  FALLS:  Has patient fallen in last 6 months? Yes. Number of falls several  LIVING ENVIRONMENT: Lives with: lives with their family and lives with their daughter Lives in: House/apartment Stairs: Yes: Internal: one flight steps; on left going up Has following equipment at home: Environmental consultant - 4 wheeled  OCCUPATION: retired  PLOF: Independent and Independent with basic ADLs  PATIENT GOALS: reduce pain hips and lower back so I can tolerate more activity  NEXT MD VISIT: not scheduled yet  OBJECTIVE:  Note: Objective measures were completed at Evaluation unless otherwise noted.  DIAGNOSTIC FINDINGS: X-ray done 11/23; IMPRESSION: 1. 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 2. Lower lumbar spondylosis.  PATIENT SURVEYS:  Modified Oswestry Modified Oswestry Low Back Pain Disability Questionnaire: 29 / 50 =  58.0 %   COGNITION: Overall cognitive status: Within functional limits for tasks assessed     SENSATION: Light touch: WFL  EDEMA:  None noted   MUSCLE LENGTH: Hamstrings: Right wfl deg; Left wfl deg Debby test: Right wfl deg; Left wfl deg  POSTURE: scoliotic posture noted concavity L lumbar, pronounced thoracic kyphosis  PALPATION: Not tender with light palpation B gr trochanters hips  LOWER EXTREMITY ROM:B hip ROM wnl, able to achieve tailor position each leg in sitting  LUMBAR ROM: FB limited 15%, also risk of falling pt needed UE support Lumbar ext, 25% with marked side bending to L with this motion SB B 35% B with central lower back pain LOWER EXTREMITY MMT:  MMT Right eval Left eval  Hip flexion    Hip extension    Hip abduction    Hip adduction    Hip internal rotation    Hip external rotation    Knee flexion    Knee extension    Ankle dorsiflexion    Ankle plantarflexion    Ankle inversion    Ankle eversion     (Blank rows =  not tested)   FUNCTIONAL TESTS:  5 times sit to stand: 25 sec, required mod assist of PT to avoid falling backwards into chair with final attempt  GAIT: Distance walked: in clinic up to 60  Assistive device utilized: Single point cane, Walker - 2 wheeled, and none Level of assistance: Min A without device and with cane Comments: very unstable without device                                                                                                                                 TREATMENT DATE:   12/07/23 Resisted trunk flex and ext 20 x Seated resisted row and shld ext 2 sets 10 Nustep L 5 8 min Green tband HS curl 2 sets 10 3# LAQ 2 sets 10 3# HHA SL hip flex,ext and abd 10 x each- decreased RT LE mvmt ADD ball squeeze 15 x    12/02/23 Nustep L 5 BM for vacuuming to decrease strain on back Green tband HS curl,hip flex and clams seated 2 sets 10 ADD ball squeeze 2 sets 10 3# LAQ 2 sets 10 Red tband seated  row and shld ext 2 sets 10 Tband trunk ext 2 sets 10 Standing with RW 3# hip ext and 10 x each     11/30/23 Amb 3 laps ( about 125 feet each) no AD 2 min 20 sec,last lap very fatigued and wobbly Standing red tband row and shld ext 2 sets 10 Seated trunk ext 2 sets 10 Nustep L 5 Green tband HS HS curl 2 sets 10 3# LAQ 2 sets 10 Standing HHA - 3# alt marching 20 x,hip flex SL 10 x BIL,hip abd 10 x BIL,hip ext 10x BIL ADD ball squeeze 20 x    11/18/23 Supine core stab ex on MH Red tband hip flex bent knee,clams 2 sets 10 ADD ball squeeze 2 sets 10 SLR 2 sets 10 PROM LE and trunk Seated red tband shld ext and row 2 seated rows Resisted trunk ext 2 sets10 Red tband LAQ 2 sets 10 Green tband HS HS curl 2 sets 10  11/16/23 Car transfer mod I Mod I getting walker in and out of car Nustep L 5 20# resisted gait fwd and backward 5 x, 3 x each side  HHA 3# LAQ 2 sets 10 Green tband HS curl 2 sets 10 Green tband hip abd 2 sets 10 Green tband hip flexion 2 sets 10 Resisted seated trunk ext 2 sets 10  11/11/23 Nustep L 5 Ball toss on airex min A,instability but no LOB Standing on airex V+CGA red tband shld ext and row 10 x STS elevated mat on airex 5 x cuing CG-min ,posterior lean 2 sets Standing on airex HHA 2# LE ex- marching,SL hip flex,ext and abd Simulated folding walker and sliding into car CG-min A with cuing Green tband HS curl 2 sets 10 Red tband LAQ 2 sets  10    11/04/23  Nustep L 5 8 min Black tband trunk ext 2 sets10, flexion green tband 2 sets 10 Seated row 15 # 2 sets 10 STS 10 x without UE 3# LAQ,hip flex and abd 2 sets 10-cued to not rock body and engage core Seated 3# dumbbell ,uptall-core engaged chest press 15 x then OH 10 x 3# LE HHA marching fwd,back and laterally Green tband HS curl 2 sets 10     11/02/23 L 5 Looked over MRI with pt and I explained and answered questions. Educ on stenosis and need to strengthen to support spine  and we also discussed injection in spine Gait training with SPC and HHA-did fair,strongly rec use of RW for safety and to help reduce strain on back Black tband trunk ext 2 sets 10 Seated 3# dumbbell ,uptall-core engaged chest press 15 x then OH 10 x Standing CG A shld ext and row 2 sets 10 yellow tband Sitting on dyna disc for increased lumb stab- 3# LAQ,hip flex,hip abd 2 sets 10,Green tband HS curl 2 sets 10 HHA 3# marching fwd and abck 15 feet 2 x Then side stepping  10/26/23 Nustep L 5 8 min Sitting on dyna disc pelvic ROM 4 ways 10 x, LAQ,hip flex and hip abd 10 x each,red tband scap stab shld ext and row Supine feet on ball bridge,KTC,obl and isometric abdominals MEch Lumb traction 40# 15 min with [facility]   10/19/23 Nustep L 5 Mech lumbar traction with MH 15 min static 30# then increase to 35# after 5 min Supine ppt 10 x. Supine modified small bridge 10x Supine clams 10 x manual resistance Supine ADD ball squeeze manual resistance Supine hip flexion Isometric abdominals   10/12/23 2# LE seated LAQ,hip flex, hip abd Red tband row and shld ext 2 sets 10 seated Green tband trunk ext  2 sets 10 Standing lateral hip f=shift 10 x each side Standing 3# trunk rotation Standing 3 # lateral trunk flexion 10 x Nustep L 1 6 min HHA side stepping 8 feet 2 x each HHA marching fwd and backward 8 feet 2 x     10/06/23: Min assist to walk from lobby to clinic, and from clinic to lobby, unsteady antalgic L, flexed posture trunk, hips Nustep, UE's and LE's , level 1 x 6 min  Seated for hamstring curls with yellow t band 15 x each leg Seated hip add isometric with ball 5 sec holds 15 reps Seated rows with yellow t band 15 reps Supine for therapist assisted stretching each hip, flex, ext, rotation gentle  Attempted B knee to chest but too painful.   09/20/23:   Evaluation, explained to pt that long term management of arthritic changes in hips in spine, should respond well to moderate  exercise. Practiced gait with st cane, pt without any marked improvement in her stability with the use of the cane.  Therefore practiced with front wheeled walker, marked improvement in gait pattern . Recommended that she use a front wheeled walker which is more light weight and easier to maneuver in and out of car for outings, she is a high fall risk.  PATIENT EDUCATION:  Education details: POC, goals Person educated: Patient Education method: Explanation, Demonstration, Tactile cues, and Verbal cues Education comprehension: verbalized understanding and returned demonstration  HOME EXERCISE PROGRAM: Green t band hip abd/ER in sitting and green t band ankle pumps L .   ASSESSMENT:  CLINICAL IMPRESSION:  pt arrives worn out  and increased pain after going to grocery store before therapy. Cues with ex for posture and to engage core. Struggled with SLS and moving RT LE.   OBJECTIVE IMPAIRMENTS: decreased activity tolerance, decreased balance, decreased knowledge of condition, decreased knowledge of use of DME, decreased mobility, difficulty walking, decreased ROM, decreased strength, decreased safety awareness, impaired perceived functional ability, postural dysfunction, and pain.   ACTIVITY LIMITATIONS: carrying, lifting, bending, standing, squatting, transfers, bed mobility, and locomotion level  PARTICIPATION LIMITATIONS: meal prep, cleaning, laundry, shopping, community activity, and yard work  PERSONAL FACTORS: Age, Behavior pattern, Fitness, Past/current experiences, Time since onset of injury/illness/exacerbation, and 1-2 comorbidities: severe DJD B hips, lumbar region, neuropathy are also affecting patient's functional outcome.   REHAB POTENTIAL: Good  CLINICAL DECISION MAKING: Evolving/moderate complexity  EVALUATION COMPLEXITY: Moderate   GOALS: Goals reviewed with patient? Yes  SHORT TERM GOALS: Target date: 2 weeks 5 5/25 I HEP Baseline: Goal status: MET  10/26/23   LONG TERM GOALS: Target date: 12 weeks, 12/13/23  Modified Oswestry Low Back Pain Disability Questionnaire: 29 / 50 = 58.0 %  Baseline:  Goal status: 11/11/23 progressing  and 11/18/23  11/30/23 progressing  2.  Safe gait with appropriate device , over 350' for safe household distances, without balance loss Baseline:  Goal status: on going 10/26/23  progressing 11/04/23  and 11/11/23  Progressing 11/18/23  progressing 11/30/23 distance met but unsteady  3.  5 x sit to stand 14.8 sec or less without balance loss Baseline: 25 sec,with UE support B  required mod assist on final rep to avoid fall  Goal status: progressing 10/26/23  progressing 11/05/23  11/18/23 multi tries and posterior lean,progressing  11/30/23 LOB progressing    PLAN:  PT FREQUENCY: 2x/week  PT DURATION: 12 weeks  PLANNED INTERVENTIONS: 97110-Therapeutic exercises, 97530- Therapeutic activity, 97112- Neuromuscular re-education, 97535- Self Care, [id number]- Manual therapy, [id number]- Electrical stimulation (unattended), and 97033- Ionotophoresis 4mg /ml Dexamethasone   PLAN FOR NEXT SESSION: assess goals   Jolee Critcher,ANGIE, PTA,  12/07/2023, 2:02 PM [facility] [address] [facility] [place], [place], [address] Phone: [phone]   Fax:  [phone]

## 2023-12-09 ENCOUNTER — Ambulatory Visit: Admitting: Physical Therapy

## 2023-12-10 ENCOUNTER — Ambulatory Visit: Admitting: Physical Therapy

## 2023-12-10 DIAGNOSIS — M5441 Lumbago with sciatica, right side: Secondary | ICD-10-CM | POA: Diagnosis not present

## 2023-12-10 DIAGNOSIS — R262 Difficulty in walking, not elsewhere classified: Secondary | ICD-10-CM | POA: Diagnosis not present

## 2023-12-10 DIAGNOSIS — R293 Abnormal posture: Secondary | ICD-10-CM | POA: Diagnosis not present

## 2023-12-10 DIAGNOSIS — G8929 Other chronic pain: Secondary | ICD-10-CM

## 2023-12-10 DIAGNOSIS — M5442 Lumbago with sciatica, left side: Secondary | ICD-10-CM | POA: Diagnosis not present

## 2023-12-10 NOTE — Therapy (Signed)
 OUTPATIENT PHYSICAL THERAPY LOWER EXTREMITY    Patient Name: Margaret [name] MRN: [id number] DOB:10-28-1940, 83 y.o., female Today's Date: 12/10/2023  END OF SESSION:  PT End of Session - 12/10/23 1017     Visit Number 14    Date for PT Re-Evaluation 12/13/23    PT Start Time 1015    PT Stop Time 1100    PT Time Calculation (min) 45 min           Past Medical History:  Diagnosis Date   Anxiety    Arthritis    Cancer (HCC)    lt. foot melanoma   Chronic kidney disease    lt. kidney cyst   Coronary artery disease    GERD (gastroesophageal reflux disease)    History of healed stress fracture 2013   bilateral feet   Hx of phlebitis    reports remote hx of superfical blood clots never anticoagulated   Hypertension    Osteopenia    Pneumonia    Past Surgical History:  Procedure Laterality Date   CATARACT EXTRACTION W/ INTRAOCULAR LENS IMPLANT Bilateral    CHOLECYSTECTOMY N/A 09/19/2016   Procedure: LAPAROSCOPIC CHOLECYSTECTOMY POSSIBLE INTRAOPERATIVE CHOLANGIOGRAM;  Surgeon: Donnice Bury, MD;  Location: [facility] OR;  Service: General;  Laterality: N/A;   ERCP N/A 09/18/2016   Procedure: ENDOSCOPIC RETROGRADE CHOLANGIOPANCREATOGRAPHY (ERCP);  Surgeon: Victory LITTIE Legrand DOUGLAS, MD;  Location: [facility] ENDOSCOPY;  Service: Endoscopy;  Laterality: N/A;   FOOT SURGERY     patient reports four foot surgeries   FOOT SURGERY Left    bunion, hammer toe surgery   FOOT SURGERY Right    shaved bunion, hammer toe correction   HERNIA REPAIR     KNEE ARTHROSCOPY Right 06/02/1995   REPLACEMENT TOTAL KNEE Left    REPLACEMENT TOTAL KNEE Left 10/30/2012   RETINAL DETACHMENT SURGERY     RETINAL DETACHMENT SURGERY Left 06/01/2006   ROBOTIC ASSITED PARTIAL NEPHRECTOMY Left 06/03/2022   Procedure: XI ROBOTIC ASSITED PARTIAL NEPHRECTOMY WITH ULTRASOUND AND VENTRAL HERNIA REPAIR;  Surgeon: Alvaro Hummer, MD;  Location: [facility] ORS;  Service: Urology;  Laterality: Left;  3 HRS   THYROIDECTOMY, PARTIAL  Right    THYROIDECTOMY, PARTIAL  06/02/2003   TOTAL KNEE ARTHROPLASTY Right 11/13/2019   Procedure: TOTAL KNEE ARTHROPLASTY SDDC;  Surgeon: Melodi Lerner, MD;  Location: [facility] ORS;  Service: Orthopedics;  Laterality: Right;    Patient Active Problem List   Diagnosis Date Noted   Renal mass 06/03/2022   Open wound of left side of back 01/20/2021   [place] 11/18/2020   Lipoma of back 11/18/2020   Lymphedema 11/18/2020   Melanocytic nevi of right upper limb, including shoulder 11/18/2020   Personal history of malignant melanoma of skin 11/18/2020   Sebaceous cyst 11/18/2020   Seborrheic dermatitis 11/18/2020   Pre-diabetes 04/03/2020   OA (osteoarthritis) of knee 11/13/2019   Total knee replacement status, 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   Pain in right knee 08/03/2019   Fluid collection at surgical site 10/20/2018   Encounter for postoperative examination after surgery for malignant neoplasm 09/30/2018   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/2018   Plantar fasciitis of right foot 02/03/2017   Porokeratosis 02/03/2017   Acute gallstone pancreatitis    Calculus of gallbladder with acute cholecystitis and obstruction    LFT elevation    Stress fracture 01/01/2016   Insomnia 12/16/2015   Peripheral neuropathy 10/18/2015   Renal insufficiency 03/06/2015   HTN (hypertension) 03/06/2015   Osteopenia 03/06/2015   GERD (  gastroesophageal reflux disease) 03/06/2015   Vaginal pessary present 03/06/2015   Metatarsalgia 07/13/2012   Increased frequency of urination 04/13/2012   DDD (degenerative disc disease), cervical 01/19/2012   Knee stiffness 01/19/2012   Low back pain 01/19/2012   Neck pain 01/19/2012   Pain of hand 01/19/2012   Hallux valgus 09/20/2011   Hyperkeratosis 08/13/2011   Anxiety disorder 07/06/2011   Depression 07/06/2011   Diverticulosis of large intestine 07/06/2011   Non-toxic multinodular goiter 07/06/2011   Atrophic vaginitis 10/24/2010   Dermatographic urticaria  09/17/2010   Inflamed seborrheic keratosis 09/17/2010   Closed fracture of fifth metatarsal bone 07/10/2010   Diagnosis unknown 02/27/2010   Hip pain 06/08/2008   Pulmonary nodule seen on imaging study 06/08/2008    PCP: Watt Raisin, MD  REFERRING PROVIDER: Charlie Dolores, MD, [facility]  REFERRING DIAG: degeneration of discs of lumbar spine  THERAPY DIAG:  Chronic bilateral low back pain with right-sided sciatica  Difficulty in walking, not elsewhere classified  Posture imbalance  Chronic bilateral low back pain with left-sided sciatica  Rationale for Evaluation and Treatment: Rehabilitation  ONSET DATE: chronic  SUBJECTIVE:   SUBJECTIVE STATEMENT:  doing pretty good for so early. Drove again   PERTINENT HISTORY: Saw orthopedist, took prednisone, which didn't help, orthopedist may want to do MRI, referred to PT by both PCP and orthopedist, also B degenerative arthritis hips PAIN:  Are you having pain? 5/10  PRECAUTIONS: Fall  RED FLAGS: None   WEIGHT BEARING RESTRICTIONS: No  FALLS:  Has patient fallen in last 6 months? Yes. Number of falls several  LIVING ENVIRONMENT: Lives with: lives with their family and lives with their daughter Lives in: House/apartment Stairs: Yes: Internal: one flight steps; on left going up Has following equipment at home: Environmental consultant - 4 wheeled  OCCUPATION: retired  PLOF: Independent and Independent with basic ADLs  PATIENT GOALS: reduce pain hips and lower back so I can tolerate more activity  NEXT MD VISIT: not scheduled yet  OBJECTIVE:  Note: Objective measures were completed at Evaluation unless otherwise noted.  DIAGNOSTIC FINDINGS: X-ray done 11/23; IMPRESSION: 1. 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 2. Lower lumbar spondylosis.  PATIENT SURVEYS:  Modified Oswestry Modified Oswestry Low Back Pain Disability Questionnaire: 29 / 50 = 58.0 %   COGNITION: Overall cognitive  status: Within functional limits for tasks assessed     SENSATION: Light touch: WFL  EDEMA:  None noted   MUSCLE LENGTH: Hamstrings: Right wfl deg; Left wfl deg Debby test: Right wfl deg; Left wfl deg  POSTURE: scoliotic posture noted concavity L lumbar, pronounced thoracic kyphosis  PALPATION: Not tender with light palpation B gr trochanters hips  LOWER EXTREMITY ROM:B hip ROM wnl, able to achieve tailor position each leg in sitting  LUMBAR ROM: FB limited 15%, also risk of falling pt needed UE support Lumbar ext, 25% with marked side bending to L with this motion SB B 35% B with central lower back pain LOWER EXTREMITY MMT:  MMT Right eval Left eval  Hip flexion    Hip extension    Hip abduction    Hip adduction    Hip internal rotation    Hip external rotation    Knee flexion    Knee extension    Ankle dorsiflexion    Ankle plantarflexion    Ankle inversion    Ankle eversion     (Blank rows = not tested)   FUNCTIONAL TESTS:  5  times sit to stand: 25 sec, required mod assist of PT to avoid falling backwards into chair with final attempt  GAIT: Distance walked: in clinic up to 60  Assistive device utilized: Single point cane, Walker - 2 wheeled, and none Level of assistance: Min A without device and with cane Comments: very unstable without device                                                                                                                                 TREATMENT DATE:   12/10/23 STS with 4# chest press 10x Assessed and documented goals Nustep L 5 Resisted gait 20# 3 x 4 ways- min A Knee ext 5# 2 set s10 HS curl 15# 2 sets 10  12/07/23 Resisted trunk flex and ext 20 x Seated resisted row and shld ext 2 sets 10 Nustep L 5 8 min Green tband HS curl 2 sets 10 3# LAQ 2 sets 10 3# HHA SL hip flex,ext and abd 10 x each- decreased RT LE mvmt ADD ball squeeze 15 x    12/02/23 Nustep L 5 BM for vacuuming to decrease strain  on back Green tband HS curl,hip flex and clams seated 2 sets 10 ADD ball squeeze 2 sets 10 3# LAQ 2 sets 10 Red tband seated row and shld ext 2 sets 10 Tband trunk ext 2 sets 10 Standing with RW 3# hip ext and 10 x each     11/30/23 Amb 3 laps ( about 125 feet each) no AD 2 min 20 sec,last lap very fatigued and wobbly Standing red tband row and shld ext 2 sets 10 Seated trunk ext 2 sets 10 Nustep L 5 Green tband HS HS curl 2 sets 10 3# LAQ 2 sets 10 Standing HHA - 3# alt marching 20 x,hip flex SL 10 x BIL,hip abd 10 x BIL,hip ext 10x BIL ADD ball squeeze 20 x    11/18/23 Supine core stab ex on MH Red tband hip flex bent knee,clams 2 sets 10 ADD ball squeeze 2 sets 10 SLR 2 sets 10 PROM LE and trunk Seated red tband shld ext and row 2 seated rows Resisted trunk ext 2 sets10 Red tband LAQ 2 sets 10 Green tband HS HS curl 2 sets 10  11/16/23 Car transfer mod I Mod I getting walker in and out of car Nustep L 5 20# resisted gait fwd and backward 5 x, 3 x each side  HHA 3# LAQ 2 sets 10 Green tband HS curl 2 sets 10 Green tband hip abd 2 sets 10 Green tband hip flexion 2 sets 10 Resisted seated trunk ext 2 sets 10  11/11/23 Nustep L 5 Ball toss on airex min A,instability but no LOB Standing on airex V+CGA red tband shld ext and row 10 x STS elevated mat on airex 5 x cuing CG-min ,posterior lean 2 sets Standing on airex HHA 2# LE  ex- marching,SL hip flex,ext and abd Simulated folding walker and sliding into car CG-min A with cuing Green tband HS curl 2 sets 10 Red tband LAQ 2 sets 10    11/04/23  Nustep L 5 8 min Black tband trunk ext 2 sets10, flexion green tband 2 sets 10 Seated row 15 # 2 sets 10 STS 10 x without UE 3# LAQ,hip flex and abd 2 sets 10-cued to not rock body and engage core Seated 3# dumbbell ,uptall-core engaged chest press 15 x then OH 10 x 3# LE HHA marching fwd,back and laterally Green tband HS curl 2 sets 10     11/02/23 L 5  Looked over MRI with pt and I explained and answered questions. Educ on stenosis and need to strengthen to support spine and we also discussed injection in spine Gait training with SPC and HHA-did fair,strongly rec use of RW for safety and to help reduce strain on back Black tband trunk ext 2 sets 10 Seated 3# dumbbell ,uptall-core engaged chest press 15 x then OH 10 x Standing CG A shld ext and row 2 sets 10 yellow tband Sitting on dyna disc for increased lumb stab- 3# LAQ,hip flex,hip abd 2 sets 10,Green tband HS curl 2 sets 10 HHA 3# marching fwd and abck 15 feet 2 x Then side stepping  10/26/23 Nustep L 5 8 min Sitting on dyna disc pelvic ROM 4 ways 10 x, LAQ,hip flex and hip abd 10 x each,red tband scap stab shld ext and row Supine feet on ball bridge,KTC,obl and isometric abdominals MEch Lumb traction 40# 15 min with [facility]   10/19/23 Nustep L 5 Mech lumbar traction with MH 15 min static 30# then increase to 35# after 5 min Supine ppt 10 x. Supine modified small bridge 10x Supine clams 10 x manual resistance Supine ADD ball squeeze manual resistance Supine hip flexion Isometric abdominals   10/12/23 2# LE seated LAQ,hip flex, hip abd Red tband row and shld ext 2 sets 10 seated Green tband trunk ext  2 sets 10 Standing lateral hip f=shift 10 x each side Standing 3# trunk rotation Standing 3 # lateral trunk flexion 10 x Nustep L 1 6 min HHA side stepping 8 feet 2 x each HHA marching fwd and backward 8 feet 2 x     10/06/23: Min assist to walk from lobby to clinic, and from clinic to lobby, unsteady antalgic L, flexed posture trunk, hips Nustep, UE's and LE's , level 1 x 6 min  Seated for hamstring curls with yellow t band 15 x each leg Seated hip add isometric with ball 5 sec holds 15 reps Seated rows with yellow t band 15 reps Supine for therapist assisted stretching each hip, flex, ext, rotation gentle  Attempted B knee to chest but too painful.   09/20/23:    Evaluation, explained to pt that long term management of arthritic changes in hips in spine, should respond well to moderate exercise. Practiced gait with st cane, pt without any marked improvement in her stability with the use of the cane.  Therefore practiced with front wheeled walker, marked improvement in gait pattern . Recommended that she use a front wheeled walker which is more light weight and easier to maneuver in and out of car for outings, she is a high fall risk.  PATIENT EDUCATION:  Education details: POC, goals Person educated: Patient Education method: Explanation, Demonstration, Tactile cues, and Verbal cues Education comprehension: verbalized understanding and returned demonstration  HOME EXERCISE PROGRAM: Green t band hip abd/ER in sitting and green t band ankle pumps L .   ASSESSMENT:  CLINICAL IMPRESSION:   Assessed goals and pt is making good progress. Pt is now driving consistently and walking further and more upright trunk posture and ability to maintain. Pt uses walker at times and at times without. Pt does fatigue esp without RW and as she fatigues balance is compromised.Pt also reports able to do more in home.Progressed strengthening and balance  OBJECTIVE IMPAIRMENTS: decreased activity tolerance, decreased balance, decreased knowledge of condition, decreased knowledge of use of DME, decreased mobility, difficulty walking, decreased ROM, decreased strength, decreased safety awareness, impaired perceived functional ability, postural dysfunction, and pain.   ACTIVITY LIMITATIONS: carrying, lifting, bending, standing, squatting, transfers, bed mobility, and locomotion level  PARTICIPATION LIMITATIONS: meal prep, cleaning, laundry, shopping, community activity, and yard work  PERSONAL FACTORS: Age, Behavior pattern, Fitness, Past/current experiences, Time since onset of injury/illness/exacerbation, and 1-2 comorbidities: severe DJD B hips, lumbar region, neuropathy are  also affecting patient's functional outcome.   REHAB POTENTIAL: Good  CLINICAL DECISION MAKING: Evolving/moderate complexity  EVALUATION COMPLEXITY: Moderate   GOALS: Goals reviewed with patient? Yes  SHORT TERM GOALS: Target date: 2 weeks 5 5/25 I HEP Baseline: Goal status: MET 10/26/23   LONG TERM GOALS: Target date: 12 weeks, 12/13/23  Modified Oswestry Low Back Pain Disability Questionnaire: 29 / 50 = 58.0 %  Baseline:  Goal status: 11/11/23 progressing  and 11/18/23  11/30/23 progressing 12/10/23 progressing  2.  Safe gait with appropriate device , over 350' for safe household distances, without balance loss Baseline:  Goal status: on going 10/26/23  progressing 11/04/23  and 11/11/23  Progressing 11/18/23  progressing 11/30/23 distance met but unsteady  12/10/23  amb without AD 250 feet, no LOB  but instability with lateral sway  3.  5 x sit to stand 14.8 sec or less without balance loss Baseline: 25 sec,with UE support B  required mod assist on final rep to avoid fall  Goal status: progressing 10/26/23  progressing 11/05/23  11/18/23 multi tries and posterior lean,progressing  11/30/23 LOB progressing  12/10/23 20 sec    PLAN:  PT FREQUENCY: 2x/week  PT DURATION: 12 weeks  PLANNED INTERVENTIONS: 97110-Therapeutic exercises, 97530- Therapeutic activity, 97112- Neuromuscular re-education, 97535- Self Care, [id number]- Manual therapy, [id number]- Electrical stimulation (unattended), and 97033- Ionotophoresis 4mg /ml Dexamethasone   PLAN FOR NEXT SESSION: assessed goals today and pt is making good progress and will benefit form additional therapy so will need renewal next week   Willamae Demby,ANGIE, PTA,  12/10/2023, 10:18 AM  [facility]. [place], [place], [address] Phone: [phone]   Fax:  [phone]

## 2023-12-13 ENCOUNTER — Encounter: Payer: Self-pay | Admitting: Physical Therapy

## 2023-12-13 ENCOUNTER — Ambulatory Visit: Admitting: Physical Therapy

## 2023-12-13 DIAGNOSIS — Z905 Acquired absence of kidney: Secondary | ICD-10-CM | POA: Diagnosis not present

## 2023-12-13 DIAGNOSIS — R262 Difficulty in walking, not elsewhere classified: Secondary | ICD-10-CM

## 2023-12-13 DIAGNOSIS — M5441 Lumbago with sciatica, right side: Secondary | ICD-10-CM | POA: Diagnosis not present

## 2023-12-13 DIAGNOSIS — R293 Abnormal posture: Secondary | ICD-10-CM

## 2023-12-13 DIAGNOSIS — G8929 Other chronic pain: Secondary | ICD-10-CM | POA: Diagnosis not present

## 2023-12-13 DIAGNOSIS — M5442 Lumbago with sciatica, left side: Secondary | ICD-10-CM | POA: Diagnosis not present

## 2023-12-13 DIAGNOSIS — C642 Malignant neoplasm of left kidney, except renal pelvis: Secondary | ICD-10-CM | POA: Diagnosis not present

## 2023-12-13 DIAGNOSIS — Z9049 Acquired absence of other specified parts of digestive tract: Secondary | ICD-10-CM | POA: Diagnosis not present

## 2023-12-13 NOTE — Therapy (Signed)
 OUTPATIENT PHYSICAL THERAPY LOWER EXTREMITY    Patient Name: Margaret [name] MRN: [id number] DOB:1940-09-28, 83 y.o., female Today's Date: 12/13/2023  END OF SESSION:  PT End of Session - 12/13/23 1557     Visit Number 15    Date for PT Re-Evaluation 12/13/23    PT Start Time 1600    PT Stop Time 1645    PT Time Calculation (min) 45 min    Activity Tolerance Patient tolerated treatment well    Behavior During Therapy WFL for tasks assessed/performed           Past Medical History:  Diagnosis Date   Anxiety    Arthritis    Cancer (HCC)    lt. foot melanoma   Chronic kidney disease    lt. kidney cyst   Coronary artery disease    GERD (gastroesophageal reflux disease)    History of healed stress fracture 2013   bilateral feet   Hx of phlebitis    reports remote hx of superfical blood clots never anticoagulated   Hypertension    Osteopenia    Pneumonia    Past Surgical History:  Procedure Laterality Date   CATARACT EXTRACTION W/ INTRAOCULAR LENS IMPLANT Bilateral    CHOLECYSTECTOMY N/A 09/19/2016   Procedure: LAPAROSCOPIC CHOLECYSTECTOMY POSSIBLE INTRAOPERATIVE CHOLANGIOGRAM;  Surgeon: Donnice Bury, MD;  Location: [facility];  Service: General;  Laterality: N/A;   ERCP N/A 09/18/2016   Procedure: ENDOSCOPIC RETROGRADE CHOLANGIOPANCREATOGRAPHY (ERCP);  Surgeon: Victory LITTIE Legrand DOUGLAS, MD;  Location: [facility] ENDOSCOPY;  Service: Endoscopy;  Laterality: N/A;   FOOT SURGERY     patient reports four foot surgeries   FOOT SURGERY Left    bunion, hammer toe surgery   FOOT SURGERY Right    shaved bunion, hammer toe correction   HERNIA REPAIR     KNEE ARTHROSCOPY Right 06/02/1995   REPLACEMENT TOTAL KNEE Left    REPLACEMENT TOTAL KNEE Left 10/30/2012   RETINAL DETACHMENT SURGERY     RETINAL DETACHMENT SURGERY Left 06/01/2006   ROBOTIC ASSITED PARTIAL NEPHRECTOMY Left 06/03/2022   Procedure: XI ROBOTIC ASSITED PARTIAL NEPHRECTOMY WITH ULTRASOUND AND VENTRAL HERNIA REPAIR;   Surgeon: Alvaro Hummer, MD;  Location: [facility] ORS;  Service: Urology;  Laterality: Left;  3 HRS   THYROIDECTOMY, PARTIAL Right    THYROIDECTOMY, PARTIAL  06/02/2003   TOTAL KNEE ARTHROPLASTY Right 11/13/2019   Procedure: TOTAL KNEE ARTHROPLASTY SDDC;  Surgeon: Melodi Lerner, MD;  Location: [facility];  Service: Orthopedics;  Laterality: Right;    Patient Active Problem List   Diagnosis Date Noted   Renal mass 06/03/2022   Open wound of left side of back 01/20/2021   [place] 11/18/2020   Lipoma of back 11/18/2020   Lymphedema 11/18/2020   Melanocytic nevi of right upper limb, including shoulder 11/18/2020   Personal history of malignant melanoma of skin 11/18/2020   Sebaceous cyst 11/18/2020   Seborrheic dermatitis 11/18/2020   Pre-diabetes 04/03/2020   OA (osteoarthritis) of knee 11/13/2019   Total knee replacement status, 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   Pain in right knee 08/03/2019   Fluid collection at surgical site 10/20/2018   Encounter for postoperative examination after surgery for malignant neoplasm 09/30/2018   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/2018   Plantar fasciitis of right foot 02/03/2017   Porokeratosis 02/03/2017   Acute gallstone pancreatitis    Calculus of gallbladder with acute cholecystitis and obstruction    LFT elevation    Stress fracture 01/01/2016   Insomnia 12/16/2015   Peripheral  neuropathy 10/18/2015   Renal insufficiency 03/06/2015   HTN (hypertension) 03/06/2015   Osteopenia 03/06/2015   GERD (gastroesophageal reflux disease) 03/06/2015   Vaginal pessary present 03/06/2015   Metatarsalgia 07/13/2012   Increased frequency of urination 04/13/2012   DDD (degenerative disc disease), cervical 01/19/2012   Knee stiffness 01/19/2012   Low back pain 01/19/2012   Neck pain 01/19/2012   Pain of hand 01/19/2012   Hallux valgus 09/20/2011   Hyperkeratosis 08/13/2011   Anxiety disorder 07/06/2011   Depression 07/06/2011   Diverticulosis of large  intestine 07/06/2011   Non-toxic multinodular goiter 07/06/2011   Atrophic vaginitis 10/24/2010   Dermatographic urticaria 09/17/2010   Inflamed seborrheic keratosis 09/17/2010   Closed fracture of fifth metatarsal bone 07/10/2010   Diagnosis unknown 02/27/2010   Hip pain 06/08/2008   Pulmonary nodule seen on imaging study 06/08/2008    PCP: Watt Raisin, MD  REFERRING PROVIDER: Charlie Dolores, MD, [facility]  REFERRING DIAG: degeneration of discs of lumbar spine  THERAPY DIAG:  Chronic bilateral low back pain with right-sided sciatica  Difficulty in walking, not elsewhere classified  Posture imbalance  Chronic bilateral low back pain with left-sided sciatica  Rationale for Evaluation and Treatment: Rehabilitation  ONSET DATE: chronic  SUBJECTIVE:   SUBJECTIVE STATEMENT:  I feel good other than being exhausted   PERTINENT HISTORY: Saw orthopedist, took prednisone, which didn't help, orthopedist may want to do MRI, referred to PT by both PCP and orthopedist, also B degenerative arthritis hips PAIN:  Are you having pain? 0/10  PRECAUTIONS: Fall  RED FLAGS: None   WEIGHT BEARING RESTRICTIONS: No  FALLS:  Has patient fallen in last 6 months? Yes. Number of falls several  LIVING ENVIRONMENT: Lives with: lives with their family and lives with their daughter Lives in: House/apartment Stairs: Yes: Internal: one flight steps; on left going up Has following equipment at home: Environmental consultant - 4 wheeled  OCCUPATION: retired  PLOF: Independent and Independent with basic ADLs  PATIENT GOALS: reduce pain hips and lower back so I can tolerate more activity  NEXT MD VISIT: not scheduled yet  OBJECTIVE:  Note: Objective measures were completed at Evaluation unless otherwise noted.  DIAGNOSTIC FINDINGS: X-ray done 11/23; IMPRESSION: 1. 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 2. Lower lumbar spondylosis.  PATIENT SURVEYS:   Modified Oswestry Modified Oswestry Low Back Pain Disability Questionnaire: 29 / 50 = 58.0 %   COGNITION: Overall cognitive status: Within functional limits for tasks assessed     SENSATION: Light touch: WFL  EDEMA:  None noted   MUSCLE LENGTH: Hamstrings: Right wfl deg; Left wfl deg Debby test: Right wfl deg; Left wfl deg  POSTURE: scoliotic posture noted concavity L lumbar, pronounced thoracic kyphosis  PALPATION: Not tender with light palpation B gr trochanters hips  LOWER EXTREMITY ROM:B hip ROM wnl, able to achieve tailor position each leg in sitting  LUMBAR ROM: FB limited 15%, also risk of falling pt needed UE support Lumbar ext, 25% with marked side bending to L with this motion SB B 35% B with central lower back pain LOWER EXTREMITY MMT:  MMT Right eval Left eval  Hip flexion    Hip extension    Hip abduction    Hip adduction    Hip internal rotation    Hip external rotation    Knee flexion    Knee extension    Ankle dorsiflexion    Ankle plantarflexion    Ankle inversion  Ankle eversion     (Blank rows = not tested)   FUNCTIONAL TESTS:  5 times sit to stand: 25 sec, required mod assist of PT to avoid falling backwards into chair with final attempt  GAIT: Distance walked: in clinic up to 60  Assistive device utilized: Single point cane, Walker - 2 wheeled, and none Level of assistance: Min A without device and with cane Comments: very unstable without device                                                                                                                                 TREATMENT DATE:  12/13/23 Nustep L 5 8 min  Knee ext 5# 2 set s10 HS curl 15# 2 sets 10 STS with 4# chest press 10x ADD ball squeeze 2x15 Hip abd w/annual resistance 2x15 Alt 4in box taps CGA 2x10  12/10/23 STS with 4# chest press 10x Assessed and documented goals Nustep L 5 Resisted gait 20# 3 x 4 ways- min A Knee ext 5# 2 set s10 HS curl 15# 2  sets 10  12/07/23 Resisted trunk flex and ext 20 x Seated resisted row and shld ext 2 sets 10 Nustep L 5 8 min Green tband HS curl 2 sets 10 3# LAQ 2 sets 10 3# HHA SL hip flex,ext and abd 10 x each- decreased RT LE mvmt ADD ball squeeze 15 x    12/02/23 Nustep L 5 BM for vacuuming to decrease strain on back Green tband HS curl,hip flex and clams seated 2 sets 10 ADD ball squeeze 2 sets 10 3# LAQ 2 sets 10 Red tband seated row and shld ext 2 sets 10 Tband trunk ext 2 sets 10 Standing with RW 3# hip ext and 10 x each     11/30/23 Amb 3 laps ( about 125 feet each) no AD 2 min 20 sec,last lap very fatigued and wobbly Standing red tband row and shld ext 2 sets 10 Seated trunk ext 2 sets 10 Nustep L 5 Green tband HS HS curl 2 sets 10 3# LAQ 2 sets 10 Standing HHA - 3# alt marching 20 x,hip flex SL 10 x BIL,hip abd 10 x BIL,hip ext 10x BIL ADD ball squeeze 20 x    11/18/23 Supine core stab ex on MH Red tband hip flex bent knee,clams 2 sets 10 ADD ball squeeze 2 sets 10 SLR 2 sets 10 PROM LE and trunk Seated red tband shld ext and row 2 seated rows Resisted trunk ext 2 sets10 Red tband LAQ 2 sets 10 Green tband HS HS curl 2 sets 10  11/16/23 Car transfer mod I Mod I getting walker in and out of car Nustep L 5 20# resisted gait fwd and backward 5 x, 3 x each side  HHA 3# LAQ 2 sets 10 Green tband HS curl 2 sets 10 Green tband hip abd 2 sets 10 Green tband hip  flexion 2 sets 10 Resisted seated trunk ext 2 sets 10  11/11/23 Nustep L 5 Ball toss on airex min A,instability but no LOB Standing on airex V+CGA red tband shld ext and row 10 x STS elevated mat on airex 5 x cuing CG-min ,posterior lean 2 sets Standing on airex HHA 2# LE ex- marching,SL hip flex,ext and abd Simulated folding walker and sliding into car CG-min A with cuing Green tband HS curl 2 sets 10 Red tband LAQ 2 sets 10    11/04/23  Nustep L 5 8 min Black tband trunk ext 2  sets10, flexion green tband 2 sets 10 Seated row 15 # 2 sets 10 STS 10 x without UE 3# LAQ,hip flex and abd 2 sets 10-cued to not rock body and engage core Seated 3# dumbbell ,uptall-core engaged chest press 15 x then OH 10 x 3# LE HHA marching fwd,back and laterally Green tband HS curl 2 sets 10     11/02/23 L 5 Looked over MRI with pt and I explained and answered questions. Educ on stenosis and need to strengthen to support spine and we also discussed injection in spine Gait training with SPC and HHA-did fair,strongly rec use of RW for safety and to help reduce strain on back Black tband trunk ext 2 sets 10 Seated 3# dumbbell ,uptall-core engaged chest press 15 x then OH 10 x Standing CG A shld ext and row 2 sets 10 yellow tband Sitting on dyna disc for increased lumb stab- 3# LAQ,hip flex,hip abd 2 sets 10,Green tband HS curl 2 sets 10 HHA 3# marching fwd and abck 15 feet 2 x Then side stepping  10/26/23 Nustep L 5 8 min Sitting on dyna disc pelvic ROM 4 ways 10 x, LAQ,hip flex and hip abd 10 x each,red tband scap stab shld ext and row Supine feet on ball bridge,KTC,obl and isometric abdominals MEch Lumb traction 40# 15 min with [facility]   10/19/23 Nustep L 5 Mech lumbar traction with MH 15 min static 30# then increase to 35# after 5 min Supine ppt 10 x. Supine modified small bridge 10x Supine clams 10 x manual resistance Supine ADD ball squeeze manual resistance Supine hip flexion Isometric abdominals   10/12/23 2# LE seated LAQ,hip flex, hip abd Red tband row and shld ext 2 sets 10 seated Green tband trunk ext  2 sets 10 Standing lateral hip f=shift 10 x each side Standing 3# trunk rotation Standing 3 # lateral trunk flexion 10 x Nustep L 1 6 min HHA side stepping 8 feet 2 x each HHA marching fwd and backward 8 feet 2 x     10/06/23: Min assist to walk from lobby to clinic, and from clinic to lobby, unsteady antalgic L, flexed posture trunk, hips Nustep, UE's  and LE's , level 1 x 6 min  Seated for hamstring curls with yellow t band 15 x each leg Seated hip add isometric with ball 5 sec holds 15 reps Seated rows with yellow t band 15 reps Supine for therapist assisted stretching each hip, flex, ext, rotation gentle  Attempted B knee to chest but too painful.   09/20/23:   Evaluation, explained to pt that long term management of arthritic changes in hips in spine, should respond well to moderate exercise. Practiced gait with st cane, pt without any marked improvement in her stability with the use of the cane.  Therefore practiced with front wheeled walker, marked improvement in gait pattern . Recommended  that she use a front wheeled walker which is more light weight and easier to maneuver in and out of car for outings, she is a high fall risk.  PATIENT EDUCATION:  Education details: POC, goals Person educated: Patient Education method: Explanation, Demonstration, Tactile cues, and Verbal cues Education comprehension: verbalized understanding and returned demonstration  HOME EXERCISE PROGRAM: Green t band hip abd/ER in sitting and green t band ankle pumps L .   ASSESSMENT:  CLINICAL IMPRESSION:   Good carryover form last session ambulating with more upright posture. She enters today ambulating without AD. Goals checked last session and progress has been made.   Fatigue present with functional interventions. Encouragement needed to complete alt box taps out of fear and uncertainty. Pt reports that she has a wedding to attend and will need to work on her balance.  OBJECTIVE IMPAIRMENTS: decreased activity tolerance, decreased balance, decreased knowledge of condition, decreased knowledge of use of DME, decreased mobility, difficulty walking, decreased ROM, decreased strength, decreased safety awareness, impaired perceived functional ability, postural dysfunction, and pain.   ACTIVITY LIMITATIONS: carrying, lifting, bending, standing, squatting,  transfers, bed mobility, and locomotion level  PARTICIPATION LIMITATIONS: meal prep, cleaning, laundry, shopping, community activity, and yard work  PERSONAL FACTORS: Age, Behavior pattern, Fitness, Past/current experiences, Time since onset of injury/illness/exacerbation, and 1-2 comorbidities: severe DJD B hips, lumbar region, neuropathy are also affecting patient's functional outcome.   REHAB POTENTIAL: Good  CLINICAL DECISION MAKING: Evolving/moderate complexity  EVALUATION COMPLEXITY: Moderate   GOALS: Goals reviewed with patient? Yes  SHORT TERM GOALS: Target date: 2 weeks 5 5/25 I HEP Baseline: Goal status: MET 10/26/23   LONG TERM GOALS: Target date: 12 weeks, 12/13/23  Modified Oswestry Low Back Pain Disability Questionnaire: 29 / 50 = 58.0 %  Baseline:  Goal status: 11/11/23 progressing  and 11/18/23  11/30/23 progressing 12/10/23 progressing  2.  Safe gait with appropriate device , over 350' for safe household distances, without balance loss Baseline:  Goal status: on going 10/26/23  progressing 11/04/23  and 11/11/23  Progressing 11/18/23  progressing 11/30/23 distance met but unsteady  12/10/23  amb without AD 250 feet, no LOB  but instability with lateral sway  3.  5 x sit to stand 14.8 sec or less without balance loss Baseline: 25 sec,with UE support B  required mod assist on final rep to avoid fall  Goal status: progressing 10/26/23  progressing 11/05/23  11/18/23 multi tries and posterior lean,progressing  11/30/23 LOB progressing  12/10/23 20 sec    PLAN:  PT FREQUENCY: 2x/week  PT DURATION: 12 weeks  PLANNED INTERVENTIONS: 97110-Therapeutic exercises, 97530- Therapeutic activity, 97112- Neuromuscular re-education, 97535- Self Care, 02859- Manual therapy, G0283- Electrical stimulation (unattended), and 97033- Ionotophoresis 4mg /ml Dexamethasone   PLAN FOR NEXT SESSION: pt is making good progress and will benefit form additional therapy so will need renewal   Tanda KANDICE Sorrow, PTA,  12/13/2023, 3:58 PM [facility] [address] [facility] [place], [place], [address] Phone: [phone]   Fax:  [phone]

## 2023-12-14 ENCOUNTER — Encounter: Payer: Self-pay | Admitting: Podiatry

## 2023-12-14 ENCOUNTER — Ambulatory Visit (INDEPENDENT_AMBULATORY_CARE_PROVIDER_SITE_OTHER): Admitting: Podiatry

## 2023-12-14 VITALS — BP 122/64 | HR 57 | Temp 98.1°F | Resp 16 | Ht 61.0 in | Wt 169.0 lb

## 2023-12-14 DIAGNOSIS — G629 Polyneuropathy, unspecified: Secondary | ICD-10-CM

## 2023-12-14 DIAGNOSIS — D2372 Other benign neoplasm of skin of left lower limb, including hip: Secondary | ICD-10-CM

## 2023-12-14 NOTE — Progress Notes (Signed)
 Subjective: Chief Complaint  Patient presents with   Callouses    Patient is here for callous trim    83 year old female presents for follow-up evaluation after undergoing soft tissue mass excision left foot.  States that she been keeping duct tape on the area which has been helping.  She does not report any new lesions.  The pain is improving where she gets the skin lesion.  She does not report any changes otherwise.  No drainage or open lesions.  No fevers or chills.    Objective: AAO x3, NAD DP/PT pulses palpable bilaterally, CRT less than 3 seconds On the left foot submetatarsal on the incision is a hyperkeratotic lesion along the area the incision.  The skin is macerated around the area.  Once I debrided the skin lesion there is no underlying ulceration, drainage or foreign object identified. Digital deformities are noted with prominent metatarsal heads. No pain with calf compression, swelling, warmth, erythema  Assessment: Skin lesion left foot  Plan:  Left foot - Debrided lesion with any complications or bleeding x 1.  She can continue with the duct tape as long as there is no skin breakdown.  Discussed the macerated tissue did not hold up on the.  Continue to monitor.  Offloading.  Right foot - Lesion appears right minimal and has healthy skin underlying without any signs of an, ulceration or concern at this time.  Hold off any surgery at this time and monitor for any reoccurrence.  Discussed prominent metatarsal head.  Discussed moisturizer and continue offloading.  Neuropathy - Acute was covered but she has not met her out-of-pocket yet therefore due to cost we will hold off on this. - Continue gabapentin  - Refilled compound cream through [facility].   Margaret [name] DPM

## 2023-12-17 ENCOUNTER — Ambulatory Visit: Admitting: Physical Therapy

## 2023-12-17 DIAGNOSIS — U071 COVID-19: Secondary | ICD-10-CM | POA: Diagnosis not present

## 2023-12-17 DIAGNOSIS — R519 Headache, unspecified: Secondary | ICD-10-CM | POA: Diagnosis not present

## 2023-12-20 ENCOUNTER — Telehealth: Payer: Self-pay | Admitting: Podiatry

## 2023-12-20 ENCOUNTER — Other Ambulatory Visit: Payer: Self-pay | Admitting: Family Medicine

## 2023-12-20 DIAGNOSIS — G8929 Other chronic pain: Secondary | ICD-10-CM

## 2023-12-20 DIAGNOSIS — M25551 Pain in right hip: Secondary | ICD-10-CM

## 2023-12-20 DIAGNOSIS — M159 Polyosteoarthritis, unspecified: Secondary | ICD-10-CM

## 2023-12-20 DIAGNOSIS — M5440 Lumbago with sciatica, unspecified side: Secondary | ICD-10-CM

## 2023-12-20 MED ORDER — HYDROCODONE-ACETAMINOPHEN 7.5-325 MG PO TABS: 1.0000 | ORAL_TABLET | Freq: Three times a day (TID) | ORAL | 0 refills | Status: DC | PRN

## 2023-12-20 NOTE — Telephone Encounter (Signed)
 Copied from CRM [id number]. Topic: Clinical - Medication Refill >> Dec 20, 2023 10:31 AM Gennette ORN wrote: Medication: HYDROcodone -acetaminophen  (NORCO) 7.5-325 MG tablet  Has the patient contacted their pharmacy? Yes (Agent: If no, request that the patient contact the pharmacy for the refill. If patient does not wish to contact the pharmacy document the reason why and proceed with request.) (Agent: If yes, when and what did the pharmacy advise?)  This is the patient's preferred pharmacy:  [facility] [id number] [place], [place] - [address] [place] [address] Phone: [phone] Fax: [phone]  Is this the correct pharmacy for this prescription? Yes If no, delete pharmacy and type the correct one.   Has the prescription been filled recently? Yes  Is the patient out of the medication? No  Has the patient been seen for an appointment in the last year OR does the patient have an upcoming appointment? Yes  Can we respond through MyChart? Yes  Agent: Please be advised that Rx refills may take up to 3 business days. We ask that you follow-up with your pharmacy.

## 2023-12-20 NOTE — Telephone Encounter (Signed)
 Patient called regarding prescription for compounded cream we send to [facility]. States she has spoken to the pharmacy and they have no received anything for her from the office. Can we resend the prescription.

## 2023-12-23 ENCOUNTER — Ambulatory Visit: Admitting: Physical Therapy

## 2023-12-27 DIAGNOSIS — K439 Ventral hernia without obstruction or gangrene: Secondary | ICD-10-CM | POA: Diagnosis not present

## 2023-12-27 DIAGNOSIS — C642 Malignant neoplasm of left kidney, except renal pelvis: Secondary | ICD-10-CM | POA: Diagnosis not present

## 2023-12-30 ENCOUNTER — Telehealth: Payer: Self-pay

## 2023-12-30 ENCOUNTER — Ambulatory Visit: Admitting: Physical Therapy

## 2023-12-30 DIAGNOSIS — F419 Anxiety disorder, unspecified: Secondary | ICD-10-CM

## 2023-12-30 DIAGNOSIS — R262 Difficulty in walking, not elsewhere classified: Secondary | ICD-10-CM | POA: Diagnosis not present

## 2023-12-30 DIAGNOSIS — R293 Abnormal posture: Secondary | ICD-10-CM

## 2023-12-30 DIAGNOSIS — G8929 Other chronic pain: Secondary | ICD-10-CM

## 2023-12-30 DIAGNOSIS — M5441 Lumbago with sciatica, right side: Secondary | ICD-10-CM | POA: Diagnosis not present

## 2023-12-30 DIAGNOSIS — M5442 Lumbago with sciatica, left side: Secondary | ICD-10-CM | POA: Diagnosis not present

## 2023-12-30 DIAGNOSIS — R21 Rash and other nonspecific skin eruption: Secondary | ICD-10-CM

## 2023-12-30 MED ORDER — DIAZEPAM 2 MG PO TABS: ORAL_TABLET | ORAL | 0 refills | Status: DC

## 2023-12-30 NOTE — Therapy (Signed)
 OUTPATIENT PHYSICAL THERAPY LOWER EXTREMITY    Patient Name: Margaret [name] MRN: [id number] DOB:April 16, 1941, 83 y.o., female Today's Date: 12/30/2023  END OF SESSION:  PT End of Session - 12/30/23 1525     Visit Number 16    Date for PT Re-Evaluation 02/10/24    PT Start Time 1530    PT Stop Time 1615    PT Time Calculation (min) 45 min           Past Medical History:  Diagnosis Date   Anxiety    Arthritis    Cancer (HCC)    lt. foot melanoma   Chronic kidney disease    lt. kidney cyst   Coronary artery disease    GERD (gastroesophageal reflux disease)    History of healed stress fracture 2013   bilateral feet   Hx of phlebitis    reports remote hx of superfical blood clots never anticoagulated   Hypertension    Osteopenia    Pneumonia    Past Surgical History:  Procedure Laterality Date   CATARACT EXTRACTION W/ INTRAOCULAR LENS IMPLANT Bilateral    CHOLECYSTECTOMY N/A 09/19/2016   Procedure: LAPAROSCOPIC CHOLECYSTECTOMY POSSIBLE INTRAOPERATIVE CHOLANGIOGRAM;  Surgeon: Donnice Bury, MD;  Location: [facility] OR;  Service: General;  Laterality: N/A;   ERCP N/A 09/18/2016   Procedure: ENDOSCOPIC RETROGRADE CHOLANGIOPANCREATOGRAPHY (ERCP);  Surgeon: Victory LITTIE Legrand DOUGLAS, MD;  Location: [facility] ENDOSCOPY;  Service: Endoscopy;  Laterality: N/A;   FOOT SURGERY     patient reports four foot surgeries   FOOT SURGERY Left    bunion, hammer toe surgery   FOOT SURGERY Right    shaved bunion, hammer toe correction   HERNIA REPAIR     KNEE ARTHROSCOPY Right 06/02/1995   REPLACEMENT TOTAL KNEE Left    REPLACEMENT TOTAL KNEE Left 10/30/2012   RETINAL DETACHMENT SURGERY     RETINAL DETACHMENT SURGERY Left 06/01/2006   ROBOTIC ASSITED PARTIAL NEPHRECTOMY Left 06/03/2022   Procedure: XI ROBOTIC ASSITED PARTIAL NEPHRECTOMY WITH ULTRASOUND AND VENTRAL HERNIA REPAIR;  Surgeon: Alvaro Hummer, MD;  Location: [facility] ORS;  Service: Urology;  Laterality: Left;  3 HRS   THYROIDECTOMY, PARTIAL  Right    THYROIDECTOMY, PARTIAL  06/02/2003   TOTAL KNEE ARTHROPLASTY Right 11/13/2019   Procedure: TOTAL KNEE ARTHROPLASTY SDDC;  Surgeon: Melodi Lerner, MD;  Location: [facility] ORS;  Service: Orthopedics;  Laterality: Right;    Patient Active Problem List   Diagnosis Date Noted   Renal mass 06/03/2022   Open wound of left side of back 01/20/2021   [place] 11/18/2020   Lipoma of back 11/18/2020   Lymphedema 11/18/2020   Melanocytic nevi of right upper limb, including shoulder 11/18/2020   Personal history of malignant melanoma of skin 11/18/2020   Sebaceous cyst 11/18/2020   Seborrheic dermatitis 11/18/2020   Pre-diabetes 04/03/2020   OA (osteoarthritis) of knee 11/13/2019   Total knee replacement status, 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   Pain in right knee 08/03/2019   Fluid collection at surgical site 10/20/2018   Encounter for postoperative examination after surgery for malignant neoplasm 09/30/2018   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/2018   Plantar fasciitis of right foot 02/03/2017   Porokeratosis 02/03/2017   Acute gallstone pancreatitis    Calculus of gallbladder with acute cholecystitis and obstruction    LFT elevation    Stress fracture 01/01/2016   Insomnia 12/16/2015   Peripheral neuropathy 10/18/2015   Renal insufficiency 03/06/2015   HTN (hypertension) 03/06/2015   Osteopenia 03/06/2015   GERD (  gastroesophageal reflux disease) 03/06/2015   Vaginal pessary present 03/06/2015   Metatarsalgia 07/13/2012   Increased frequency of urination 04/13/2012   DDD (degenerative disc disease), cervical 01/19/2012   Knee stiffness 01/19/2012   Low back pain 01/19/2012   Neck pain 01/19/2012   Pain of hand 01/19/2012   Hallux valgus 09/20/2011   Hyperkeratosis 08/13/2011   Anxiety disorder 07/06/2011   Depression 07/06/2011   Diverticulosis of large intestine 07/06/2011   Non-toxic multinodular goiter 07/06/2011   Atrophic vaginitis 10/24/2010   Dermatographic urticaria  09/17/2010   Inflamed seborrheic keratosis 09/17/2010   Closed fracture of fifth metatarsal bone 07/10/2010   Diagnosis unknown 02/27/2010   Hip pain 06/08/2008   Pulmonary nodule seen on imaging study 06/08/2008    PCP: Watt Raisin, MD  REFERRING PROVIDER: Charlie Dolores, MD, [facility]  REFERRING DIAG: degeneration of discs of lumbar spine  THERAPY DIAG:  Chronic bilateral low back pain with right-sided sciatica  Difficulty in walking, not elsewhere classified  Posture imbalance  Chronic bilateral low back pain with left-sided sciatica  Rationale for Evaluation and Treatment: Rehabilitation  ONSET DATE: chronic  SUBJECTIVE:   SUBJECTIVE STATEMENT:  drove today and no walker. Doing better but yesterday was rough. Good and bad days so getting inj next week   PERTINENT HISTORY: Saw orthopedist, took prednisone, which didn't help, orthopedist may want to do MRI, referred to PT by both PCP and orthopedist, also B degenerative arthritis hips PAIN:  Are you having pain? 0/10  PRECAUTIONS: Fall  RED FLAGS: None   WEIGHT BEARING RESTRICTIONS: No  FALLS:  Has patient fallen in last 6 months? Yes. Number of falls several  LIVING ENVIRONMENT: Lives with: lives with their family and lives with their daughter Lives in: House/apartment Stairs: Yes: Internal: one flight steps; on left going up Has following equipment at home: Environmental consultant - 4 wheeled  OCCUPATION: retired  PLOF: Independent and Independent with basic ADLs  PATIENT GOALS: reduce pain hips and lower back so I can tolerate more activity  NEXT MD VISIT: not scheduled yet  OBJECTIVE:  Note: Objective measures were completed at Evaluation unless otherwise noted.  DIAGNOSTIC FINDINGS: X-ray done 11/23; IMPRESSION: 1. 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 2. Lower lumbar spondylosis.  PATIENT SURVEYS:  Modified Oswestry Modified Oswestry Low Back Pain Disability  Questionnaire: 29 / 50 = 58.0 %   COGNITION: Overall cognitive status: Within functional limits for tasks assessed     SENSATION: Light touch: WFL  EDEMA:  None noted   MUSCLE LENGTH: Hamstrings: Right wfl deg; Left wfl deg Debby test: Right wfl deg; Left wfl deg  POSTURE: scoliotic posture noted concavity L lumbar, pronounced thoracic kyphosis  PALPATION: Not tender with light palpation B gr trochanters hips  LOWER EXTREMITY ROM:B hip ROM wnl, able to achieve tailor position each leg in sitting  LUMBAR ROM: FB limited 15%, also risk of falling pt needed UE support Lumbar ext, 25% with marked side bending to L with this motion SB B 35% B with central lower back pain LOWER EXTREMITY MMT:  MMT Right eval Left eval  Hip flexion    Hip extension    Hip abduction    Hip adduction    Hip internal rotation    Hip external rotation    Knee flexion    Knee extension    Ankle dorsiflexion    Ankle plantarflexion    Ankle inversion    Ankle eversion     (  Blank rows = not tested)   FUNCTIONAL TESTS:  5 times sit to stand: 25 sec, required mod assist of PT to avoid falling backwards into chair with final attempt  GAIT: Distance walked: in clinic up to 60  Assistive device utilized: Single point cane, Walker - 2 wheeled, and none Level of assistance: Min A without device and with cane Comments: very unstable without device                                                                                                                                 TREATMENT DATE:   12/30/23 Nustep L 5 Goals assessed and documented STS 5x 16.54 sec Foam beam in // bars 3x side stepping and then tandem fwd and back CGA with light HHA STS with ball toss 10 x Ball toss on airex, only 1 LOB 2 sets 10 Knee ext 5# 2 set s10 HS curl 15# 2 sets 10 [employer] 5# 1 lap each hand Resisted seated trunk ext 2 sets 10 Seated row 15# 2 sets 10     12/13/23 Nustep L 5 8 min  Knee  ext 5# 2 set s10 HS curl 15# 2 sets 10 STS with 4# chest press 10x ADD ball squeeze 2x15 Hip abd w/annual resistance 2x15 Alt 4in box taps CGA 2x10  12/10/23 STS with 4# chest press 10x Assessed and documented goals Nustep L 5 Resisted gait 20# 3 x 4 ways- min A Knee ext 5# 2 set s10 HS curl 15# 2 sets 10  12/07/23 Resisted trunk flex and ext 20 x Seated resisted row and shld ext 2 sets 10 Nustep L 5 8 min Green tband HS curl 2 sets 10 3# LAQ 2 sets 10 3# HHA SL hip flex,ext and abd 10 x each- decreased RT LE mvmt ADD ball squeeze 15 x    12/02/23 Nustep L 5 BM for vacuuming to decrease strain on back Green tband HS curl,hip flex and clams seated 2 sets 10 ADD ball squeeze 2 sets 10 3# LAQ 2 sets 10 Red tband seated row and shld ext 2 sets 10 Tband trunk ext 2 sets 10 Standing with RW 3# hip ext and 10 x each      09/20/23:   Evaluation, explained to pt that long term management of arthritic changes in hips in spine, should respond well to moderate exercise. Practiced gait with st cane, pt without any marked improvement in her stability with the use of the cane.  Therefore practiced with front wheeled walker, marked improvement in gait pattern . Recommended that she use a front wheeled walker which is more light weight and easier to maneuver in and out of car for outings, she is a high fall risk.  PATIENT EDUCATION:  Education details: POC, goals Person educated: Patient Education method: Explanation, Demonstration, Tactile cues, and Verbal cues Education comprehension: verbalized understanding and returned demonstration  HOME EXERCISE  PROGRAM: Green t band hip abd/ER in sitting and green t band ankle pumps L .   ASSESSMENT:  CLINICAL IMPRESSION:   Overall pt is doing much better and able to do more activity. Walking some without walker and driving now. Pt states she is getting inj in back next week. Pt is very pleased with progress and would like to  continue to get stronger and better balance as she is [place]. Goals assessed and documented. Progressed strengthening OBJECTIVE IMPAIRMENTS: decreased activity tolerance, decreased balance, decreased knowledge of condition, decreased knowledge of use of DME, decreased mobility, difficulty walking, decreased ROM, decreased strength, decreased safety awareness, impaired perceived functional ability, postural dysfunction, and pain.   ACTIVITY LIMITATIONS: carrying, lifting, bending, standing, squatting, transfers, bed mobility, and locomotion level  PARTICIPATION LIMITATIONS: meal prep, cleaning, laundry, shopping, community activity, and yard work  PERSONAL FACTORS: Age, Behavior pattern, Fitness, Past/current experiences, Time since onset of injury/illness/exacerbation, and 1-2 comorbidities: severe DJD B hips, lumbar region, neuropathy are also affecting patient's functional outcome.   REHAB POTENTIAL: Good  CLINICAL DECISION MAKING: Evolving/moderate complexity  EVALUATION COMPLEXITY: Moderate   GOALS: Goals reviewed with patient? Yes  SHORT TERM GOALS: Target date: 2 weeks 5 5/25 I HEP Baseline: Goal status: MET 10/26/23   LONG TERM GOALS: Target date: 12 weeks, 02/10/24  Modified Oswestry Low Back Pain Disability Questionnaire: 29 / 50 = 58.0 %  Baseline:  Goal status: 11/11/23 progressing  and 11/18/23  11/30/23 progressing 12/10/23 progressing   12/30/23 progressing  2.  Safe gait with appropriate device , over 350' for safe household distances, without balance loss Baseline:  Goal status: on going 10/26/23  progressing 11/04/23  and 11/11/23  Progressing 11/18/23  progressing 11/30/23 distance met but unsteady  12/10/23  amb without AD 250 feet, no LOB  but instability with lateral sway 12/30/23 progressing 250 feet then slows down and balance decreases  3.  5 x sit to stand 14.8 sec or less without balance loss Baseline: 25 sec,with UE support  B  required mod assist on final rep to avoid fall  Goal status: progressing 10/26/23  progressing 11/05/23  11/18/23 multi tries and posterior lean,progressing  11/30/23 LOB progressing  12/10/23 20 sec 12/30/23 progressing     PLAN:  PT FREQUENCY: 2x/week  PT DURATION: 12 weeks  PLANNED INTERVENTIONS: 97110-Therapeutic exercises, 97530- Therapeutic activity, 97112- Neuromuscular re-education, 97535- Self Care, 02859- Manual therapy, G0283- Electrical stimulation (unattended), and 97033- Ionotophoresis 4mg /ml Dexamethasone   PLAN FOR NEXT SESSION: pt is making good progress and will benefit form additional therapy . Renewal done 1-2 x a week as schedule permits for her. Inj next week so will ski[p   Haniyah Maciolek,ANGIE, PTA,  12/30/2023, 4:05 PM [facility] [place], [place], [address] Phone: [phone]   Fax:  [phone]

## 2023-12-30 NOTE — Telephone Encounter (Signed)
 Copied from CRM [id number]. Topic: Clinical - Medication Question >> Dec 30, 2023 10:40 AM Chiquita SQUIBB wrote: Reason for CRM: Patient is calling in regarding some injections she is having on Tuesday and she is very nervous for them, patient is asking if Dr. Watt can prescribe anything to calm her nerves for the injections. Patient stated when she had her MRI Dr. Watt gave her something and it worked well. Please advise the patient.

## 2023-12-30 NOTE — Addendum Note (Signed)
 Addended by: WATT RAISIN C on: 12/30/2023 12:46 PM   Modules accepted: Orders

## 2024-01-03 ENCOUNTER — Other Ambulatory Visit: Payer: Self-pay | Admitting: Family Medicine

## 2024-01-03 DIAGNOSIS — E785 Hyperlipidemia, unspecified: Secondary | ICD-10-CM

## 2024-01-04 DIAGNOSIS — M5416 Radiculopathy, lumbar region: Secondary | ICD-10-CM | POA: Diagnosis not present

## 2024-01-10 ENCOUNTER — Ambulatory Visit (INDEPENDENT_AMBULATORY_CARE_PROVIDER_SITE_OTHER): Admitting: Podiatry

## 2024-01-10 DIAGNOSIS — D2372 Other benign neoplasm of skin of left lower limb, including hip: Secondary | ICD-10-CM | POA: Diagnosis not present

## 2024-01-10 DIAGNOSIS — G629 Polyneuropathy, unspecified: Secondary | ICD-10-CM | POA: Diagnosis not present

## 2024-01-10 NOTE — Progress Notes (Signed)
 Subjective: Chief Complaint  Patient presents with   Benign neoplasm of skin of left foot    Pt stated that she is doing well she has no new concerns at this time     83 year old female presents for follow-up evaluation of foot pain.  She says her neuropathy has been getting worse.  She recently had an injection in her back which has not seen any improvement so far.  Calluses not been causes much discomfort.   Objective: AAO x3, NAD DP/PT pulses palpable bilaterally, CRT less than 3 seconds Sensation decreased with Semmes Weinstein monofilament. Minimal callus formation submetatarsal bilateral without any underlying ulceration, drainage or signs of infection.  No pigment changes noted. Bunion, digital contractures present.  Prominent metatarsal heads noted. No pain with calf compression, swelling, warmth, erythema  Assessment: Skin lesion left foot;  Plan:  Left foot - Debrided lesion with any complications or bleeding x 2.  Overall improving.  Continue moisturizer, offloading.  Neuropathy - Qutenza was covered but she has not met her out-of-pocket yet therefore due to cost we will hold off on this. - Continue gabapentin  - Refilled compound cream through [employer]. - There may be some component from her back.  Should be sent in injection she is can restart physical therapy next week.   Donnice JONELLE Fees DPM

## 2024-01-18 ENCOUNTER — Ambulatory Visit: Attending: Family Medicine | Admitting: Physical Therapy

## 2024-01-18 ENCOUNTER — Other Ambulatory Visit: Payer: Self-pay | Admitting: Podiatry

## 2024-01-18 DIAGNOSIS — R293 Abnormal posture: Secondary | ICD-10-CM | POA: Diagnosis not present

## 2024-01-18 DIAGNOSIS — M5441 Lumbago with sciatica, right side: Secondary | ICD-10-CM | POA: Diagnosis not present

## 2024-01-18 DIAGNOSIS — G8929 Other chronic pain: Secondary | ICD-10-CM | POA: Insufficient documentation

## 2024-01-18 DIAGNOSIS — R262 Difficulty in walking, not elsewhere classified: Secondary | ICD-10-CM | POA: Diagnosis not present

## 2024-01-18 NOTE — Therapy (Signed)
 OUTPATIENT PHYSICAL THERAPY LOWER EXTREMITY    Patient Name: Margaret [name] MRN: [id number] DOB:Apr 24, 1941, 83 y.o., female Today's Date: 01/18/2024  END OF SESSION:  PT End of Session - 01/18/24 1146     Visit Number 17    Date for PT Re-Evaluation 02/10/24    PT Start Time 1145    PT Stop Time 1230    PT Time Calculation (min) 45 min           Past Medical History:  Diagnosis Date   Anxiety    Arthritis    Cancer (HCC)    lt. foot melanoma   Chronic kidney disease    lt. kidney cyst   Coronary artery disease    GERD (gastroesophageal reflux disease)    History of healed stress fracture 2013   bilateral feet   Hx of phlebitis    reports remote hx of superfical blood clots never anticoagulated   Hypertension    Osteopenia    Pneumonia    Past Surgical History:  Procedure Laterality Date   CATARACT EXTRACTION W/ INTRAOCULAR LENS IMPLANT Bilateral    CHOLECYSTECTOMY N/A 09/19/2016   Procedure: LAPAROSCOPIC CHOLECYSTECTOMY POSSIBLE INTRAOPERATIVE CHOLANGIOGRAM;  Surgeon: Donnice Bury, MD;  Location: [facility] OR;  Service: General;  Laterality: N/A;   ERCP N/A 09/18/2016   Procedure: ENDOSCOPIC RETROGRADE CHOLANGIOPANCREATOGRAPHY (ERCP);  Surgeon: Victory LITTIE Legrand DOUGLAS, MD;  Location: [facility] END[facility];  Service: Endoscopy;  Laterality: N/A;   FOOT SURGERY     patient reports four foot surgeries   FOOT SURGERY Left    bunion, hammer toe surgery   FOOT SURGERY Right    shaved bunion, hammer toe correction   HERNIA REPAIR     KNEE ARTHROSCOPY Right 06/02/1995   REPLACEMENT TOTAL KNEE Left    REPLACEMENT TOTAL KNEE Left 10/30/2012   RETINAL DETACHMENT SURGERY     RETINAL DETACHMENT SURGERY Left 06/01/2006   ROBOTIC ASSITED PARTIAL NEPHRECTOMY Left 06/03/2022   Procedure: XI ROBOTIC ASSITED PARTIAL NEPHRECTOMY WITH ULTRASOUND AND VENTRAL HERNIA REPAIR;  Surgeon: Alvaro Hummer, MD;  Location: [facility];  Service: Urology;  Laterality: Left;  3 HRS   THYROIDECTOMY, PARTIAL  Right    THYROIDECTOMY, PARTIAL  06/02/2003   TOTAL KNEE ARTHROPLASTY Right 11/13/2019   Procedure: TOTAL KNEE ARTHROPLASTY SDDC;  Surgeon: Melodi Lerner, MD;  Location: [facility];  Service: Orthopedics;  Laterality: Right;    Patient Active Problem List   Diagnosis Date Noted   Renal mass 06/03/2022   Open wound of left side of back 01/20/2021   [place] 11/18/2020   Lipoma of back 11/18/2020   Lymphedema 11/18/2020   Melanocytic nevi of right upper limb, including shoulder 11/18/2020   Personal history of malignant melanoma of skin 11/18/2020   Sebaceous cyst 11/18/2020   Seborrheic dermatitis 11/18/2020   Pre-diabetes 04/03/2020   OA (osteoarthritis) of knee 11/13/2019   Total knee replacement status, 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   Pain in right knee 08/03/2019   Fluid collection at surgical site 10/20/2018   Encounter for postoperative examination after surgery for malignant neoplasm 09/30/2018   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/2018   Plantar fasciitis of right foot 02/03/2017   Porokeratosis 02/03/2017   Acute gallstone pancreatitis    Calculus of gallbladder with acute cholecystitis and obstruction    LFT elevation    Stress fracture 01/01/2016   Insomnia 12/16/2015   Peripheral neuropathy 10/18/2015   Renal insufficiency 03/06/2015   HTN (hypertension) 03/06/2015   Osteopenia 03/06/2015   GERD (  gastroesophageal reflux disease) 03/06/2015   Vaginal pessary present 03/06/2015   Metatarsalgia 07/13/2012   Increased frequency of urination 04/13/2012   DDD (degenerative disc disease), cervical 01/19/2012   Knee stiffness 01/19/2012   Low back pain 01/19/2012   Neck pain 01/19/2012   Pain of hand 01/19/2012   Hallux valgus 09/20/2011   Hyperkeratosis 08/13/2011   Anxiety disorder 07/06/2011   Depression 07/06/2011   Diverticulosis of large intestine 07/06/2011   Non-toxic multinodular goiter 07/06/2011   Atrophic vaginitis 10/24/2010   Dermatographic urticaria  09/17/2010   Inflamed seborrheic keratosis 09/17/2010   Closed fracture of fifth metatarsal bone 07/10/2010   Diagnosis unknown 02/27/2010   Hip pain 06/08/2008   Pulmonary nodule seen on imaging study 06/08/2008    PCP: Watt Raisin, MD  REFERRING PROVIDER: Charlie Dolores, MD, [facility]  REFERRING DIAG: degeneration of discs of lumbar spine  THERAPY DIAG:  Chronic bilateral low back pain with right-sided sciatica  Difficulty in walking, not elsewhere classified  Posture imbalance  Rationale for Evaluation and Treatment: Rehabilitation  ONSET DATE: chronic  SUBJECTIVE:   SUBJECTIVE STATEMENT:  drove today and no walker. Injection did not help. Back pain and increased neuropathy is just awful  PERTINENT HISTORY: Saw orthopedist, took prednisone, which didn't help, orthopedist may want to do MRI, referred to PT by both PCP and orthopedist, also B degenerative arthritis hips PAIN:  Are you having pain? 7/10  PRECAUTIONS: Fall  RED FLAGS: None   WEIGHT BEARING RESTRICTIONS: No  FALLS:  Has patient fallen in last 6 months? Yes. Number of falls several  LIVING ENVIRONMENT: Lives with: lives with their family and lives with their daughter Lives in: House/apartment Stairs: Yes: Internal: one flight steps; on left going up Has following equipment at home: Environmental consultant - 4 wheeled  OCCUPATION: retired  PLOF: Independent and Independent with basic ADLs  PATIENT GOALS: reduce pain hips and lower back so I can tolerate more activity  NEXT MD VISIT: not scheduled yet  OBJECTIVE:  Note: Objective measures were completed at Evaluation unless otherwise noted.  DIAGNOSTIC FINDINGS: X-ray done 11/23; IMPRESSION: 1. 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 2. Lower lumbar spondylosis.  PATIENT SURVEYS:  Modified Oswestry Modified Oswestry Low Back Pain Disability Questionnaire: 29 / 50 = 58.0 %   COGNITION: Overall cognitive status:  Within functional limits for tasks assessed     SENSATION: Light touch: WFL  EDEMA:  None noted   MUSCLE LENGTH: Hamstrings: Right wfl deg; Left wfl deg Debby test: Right wfl deg; Left wfl deg  POSTURE: scoliotic posture noted concavity L lumbar, pronounced thoracic kyphosis  PALPATION: Not tender with light palpation B gr trochanters hips  LOWER EXTREMITY ROM:B hip ROM wnl, able to achieve tailor position each leg in sitting  LUMBAR ROM: FB limited 15%, also risk of falling pt needed UE support Lumbar ext, 25% with marked side bending to L with this motion SB B 35% B with central lower back pain LOWER EXTREMITY MMT:  MMT Right eval Left eval  Hip flexion    Hip extension    Hip abduction    Hip adduction    Hip internal rotation    Hip external rotation    Knee flexion    Knee extension    Ankle dorsiflexion    Ankle plantarflexion    Ankle inversion    Ankle eversion     (Blank rows = not tested)   FUNCTIONAL TESTS:  5 times  sit to stand: 25 sec, required mod assist of PT to avoid falling backwards into chair with final attempt  GAIT: Distance walked: in clinic up to 60  Assistive device utilized: Single point cane, Walker - 2 wheeled, and none Level of assistance: Min A without device and with cane Comments: very unstable without device                                                                                                                                 TREATMENT DATE:   01/18/24 Nustep L 5 Goals assessed and documented Knee ext 5# 2 set s10 HS curl 15# 2 sets 10 Resisted seated trunk ext 2 sets 10 Seated row 20# 10 x  15# 10 x 3# ankle wts HHA marcing fwd and back ward 15 feet 3 x each, side stepping 15 feet 3 x each 3# ankle wt HHA 10 x each SL hip flex,ext and abd  12/30/23 Nustep L 5 Goals assessed and documented STS 5x 16.54 sec Foam beam in // bars 3x side stepping and then tandem fwd and back CGA with light HHA STS  with ball toss 10 x Ball toss on airex, only 1 LOB 2 sets 10 Knee ext 5# 2 set s10 HS curl 15# 2 sets 10 [employer] 5# 1 lap each hand Resisted seated trunk ext 2 sets 10 Seated row 15# 2 sets 10     12/13/23 Nustep L 5 8 min  Knee ext 5# 2 set s10 HS curl 15# 2 sets 10 STS with 4# chest press 10x ADD ball squeeze 2x15 Hip abd w/annual resistance 2x15 Alt 4in box taps CGA 2x10  12/10/23 STS with 4# chest press 10x Assessed and documented goals Nustep L 5 Resisted gait 20# 3 x 4 ways- min A Knee ext 5# 2 set s10 HS curl 15# 2 sets 10  12/07/23 Resisted trunk flex and ext 20 x Seated resisted row and shld ext 2 sets 10 Nustep L 5 8 min Green tband HS curl 2 sets 10 3# LAQ 2 sets 10 3# HHA SL hip flex,ext and abd 10 x each- decreased RT LE mvmt ADD ball squeeze 15 x    12/02/23 Nustep L 5 BM for vacuuming to decrease strain on back Green tband HS curl,hip flex and clams seated 2 sets 10 ADD ball squeeze 2 sets 10 3# LAQ 2 sets 10 Red tband seated row and shld ext 2 sets 10 Tband trunk ext 2 sets 10 Standing with RW 3# hip ext and 10 x each      09/20/23:   Evaluation, explained to pt that long term management of arthritic changes in hips in spine, should respond well to moderate exercise. Practiced gait with st cane, pt without any marked improvement in her stability with the use of the cane.  Therefore practiced with front wheeled walker, marked improvement in gait pattern . Recommended  that she use a front wheeled walker which is more light weight and easier to maneuver in and out of car for outings, she is a high fall risk.  PATIENT EDUCATION:  Education details: POC, goals Person educated: Patient Education method: Explanation, Demonstration, Tactile cues, and Verbal cues Education comprehension: verbalized understanding and returned demonstration  HOME EXERCISE PROGRAM: Green t band hip abd/ER in sitting and green t band ankle pumps L .    ASSESSMENT:  CLINICAL IMPRESSION:  pt arrived after not being here for almost 3 weeks, pt feels she is almost back to ground zero. Stated inj did not help but she definitely sees how much PT helps. Pt states ain 7/10 so requested we start back slowly. Assessed goals- decline in STS and walking goal since last visit. progressed ex for ROM and strength.  OBJECTIVE IMPAIRMENTS: decreased activity tolerance, decreased balance, decreased knowledge of condition, decreased knowledge of use of DME, decreased mobility, difficulty walking, decreased ROM, decreased strength, decreased safety awareness, impaired perceived functional ability, postural dysfunction, and pain.   ACTIVITY LIMITATIONS: carrying, lifting, bending, standing, squatting, transfers, bed mobility, and locomotion level  PARTICIPATION LIMITATIONS: meal prep, cleaning, laundry, shopping, community activity, and yard work  PERSONAL FACTORS: Age, Behavior pattern, Fitness, Past/current experiences, Time since onset of injury/illness/exacerbation, and 1-2 comorbidities: severe DJD B hips, lumbar region, neuropathy are also affecting patient's functional outcome.   REHAB POTENTIAL: Good  CLINICAL DECISION MAKING: Evolving/moderate complexity  EVALUATION COMPLEXITY: Moderate   GOALS: Goals reviewed with patient? Yes  SHORT TERM GOALS: Target date: 2 weeks 5 5/25 I HEP Baseline: Goal status: MET 10/26/23   LONG TERM GOALS: Target date: 12 weeks, 02/10/24  Modified Oswestry Low Back Pain Disability Questionnaire: 29 / 50 = 58.0 %  Baseline:  Goal status: 11/11/23 progressing  and 11/18/23  11/30/23 progressing 12/10/23 progressing   12/30/23 progressing   01/18/24 progressing  2.  Safe gait with appropriate device , over 350' for safe household distances, without balance loss Baseline:  Goal status: on going 10/26/23  progressing 11/04/23  and 11/11/23  Progressing 11/18/23  progressing 11/30/23 distance met but unsteady  12/10/23  amb  without AD 250 feet, no LOB  but instability with lateral sway 12/30/23 progressing 250 feet then slows down and balance decreases 01/18/24 progressing  3.  5 x sit to stand 14.8 sec or less without balance loss Baseline: 25 sec,with UE support B  required mod assist on final rep to avoid fall  Goal status: progressing 10/26/23  progressing 11/05/23  11/18/23 multi tries and posterior lean,progressing  11/30/23 LOB progressing  12/10/23 20 sec 12/30/23 progressing   01/18/24 18.5 sec with 1 x bracing knees    PLAN:  PT FREQUENCY: 2x/week  PT DURATION: 12 weeks  PLANNED INTERVENTIONS: 97110-Therapeutic exercises, 97530- Therapeutic activity, 97112- Neuromuscular re-education, 97535- Self Care, [id number]- Manual therapy, G0283- Electrical stimulation (unattended), and 97033- Ionotophoresis 4mg /ml Dexamethasone   PLAN FOR NEXT SESSION:  slowly progress back with strengthening as pt feels she has slide backwards without being here Brentlee Sciara,ANGIE, PTA,  01/18/2024, 12:14 PM [facility] at [facility]. [place], [place], [address] Phone: [phone]   Fax:  [phone]

## 2024-01-25 ENCOUNTER — Ambulatory Visit: Admitting: Physical Therapy

## 2024-01-25 DIAGNOSIS — R262 Difficulty in walking, not elsewhere classified: Secondary | ICD-10-CM | POA: Diagnosis not present

## 2024-01-25 DIAGNOSIS — G8929 Other chronic pain: Secondary | ICD-10-CM

## 2024-01-25 DIAGNOSIS — M5441 Lumbago with sciatica, right side: Secondary | ICD-10-CM | POA: Diagnosis not present

## 2024-01-25 DIAGNOSIS — R293 Abnormal posture: Secondary | ICD-10-CM | POA: Diagnosis not present

## 2024-01-25 NOTE — Therapy (Signed)
 OUTPATIENT PHYSICAL THERAPY LOWER EXTREMITY    Patient Name: Margaret [name] MRN: [id number] DOB:31-Mar-1941, 83 y.o., female Today's Date: 01/25/2024  END OF SESSION:  PT End of Session - 01/25/24 1151     Visit Number 18    Date for PT Re-Evaluation 02/10/24    PT Start Time 1145    PT Stop Time 1230    PT Time Calculation (min) 45 min           Past Medical History:  Diagnosis Date   Anxiety    Arthritis    Cancer (HCC)    lt. foot melanoma   Chronic kidney disease    lt. kidney cyst   Coronary artery disease    GERD (gastroesophageal reflux disease)    History of healed stress fracture 2013   bilateral feet   Hx of phlebitis    reports remote hx of superfical blood clots never anticoagulated   Hypertension    Osteopenia    Pneumonia    Past Surgical History:  Procedure Laterality Date   CATARACT EXTRACTION W/ INTRAOCULAR LENS IMPLANT Bilateral    CHOLECYSTECTOMY N/A 09/19/2016   Procedure: LAPAROSCOPIC CHOLECYSTECTOMY POSSIBLE INTRAOPERATIVE CHOLANGIOGRAM;  Surgeon: Donnice Bury, MD;  Location: [facility] OR;  Service: General;  Laterality: N/A;   ERCP N/A 09/18/2016   Procedure: ENDOSCOPIC RETROGRADE CHOLANGIOPANCREATOGRAPHY (ERCP);  Surgeon: Victory LITTIE Legrand DOUGLAS, MD;  Location: [facility] ENDOSCOPY;  Service: Endoscopy;  Laterality: N/A;   FOOT SURGERY     patient reports four foot surgeries   FOOT SURGERY Left    bunion, hammer toe surgery   FOOT SURGERY Right    shaved bunion, hammer toe correction   HERNIA REPAIR     KNEE ARTHROSCOPY Right 06/02/1995   REPLACEMENT TOTAL KNEE Left    REPLACEMENT TOTAL KNEE Left 10/30/2012   RETINAL DETACHMENT SURGERY     RETINAL DETACHMENT SURGERY Left 06/01/2006   ROBOTIC ASSITED PARTIAL NEPHRECTOMY Left 06/03/2022   Procedure: XI ROBOTIC ASSITED PARTIAL NEPHRECTOMY WITH ULTRASOUND AND VENTRAL HERNIA REPAIR;  Surgeon: Alvaro Hummer, MD;  Location: [facility] ORS;  Service: Urology;  Laterality: Left;  3 HRS   THYROIDECTOMY, PARTIAL  Right    THYROIDECTOMY, PARTIAL  06/02/2003   TOTAL KNEE ARTHROPLASTY Right 11/13/2019   Procedure: TOTAL KNEE ARTHROPLASTY SDDC;  Surgeon: Melodi Lerner, MD;  Location: [facility]S;  Service: Orthopedics;  Laterality: Right;    Patient Active Problem List   Diagnosis Date Noted   Renal mass 06/03/2022   Open wound of left side of back 01/20/2021   [place] 11/18/2020   Lipoma of back 11/18/2020   Lymphedema 11/18/2020   Melanocytic nevi of right upper limb, including shoulder 11/18/2020   Personal history of malignant melanoma of skin 11/18/2020   Sebaceous cyst 11/18/2020   Seborrheic dermatitis 11/18/2020   Pre-diabetes 04/03/2020   OA (osteoarthritis) of knee 11/13/2019   Total knee replacement status, 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   Pain in right knee 08/03/2019   Fluid collection at surgical site 10/20/2018   Encounter for postoperative examination after surgery for malignant neoplasm 09/30/2018   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/2018   Plantar fasciitis of right foot 02/03/2017   Porokeratosis 02/03/2017   Acute gallstone pancreatitis    Calculus of gallbladder with acute cholecystitis and obstruction    LFT elevation    Stress fracture 01/01/2016   Insomnia 12/16/2015   Peripheral neuropathy 10/18/2015   Renal insufficiency 03/06/2015   HTN (hypertension) 03/06/2015   Osteopenia 03/06/2015   GERD (  gastroesophageal reflux disease) 03/06/2015   Vaginal pessary present 03/06/2015   Metatarsalgia 07/13/2012   Increased frequency of urination 04/13/2012   DDD (degenerative disc disease), cervical 01/19/2012   Knee stiffness 01/19/2012   Low back pain 01/19/2012   Neck pain 01/19/2012   Pain of hand 01/19/2012   Hallux valgus 09/20/2011   Hyperkeratosis 08/13/2011   Anxiety disorder 07/06/2011   Depression 07/06/2011   Diverticulosis of large intestine 07/06/2011   Non-toxic multinodular goiter 07/06/2011   Atrophic vaginitis 10/24/2010   Dermatographic urticaria  09/17/2010   Inflamed seborrheic keratosis 09/17/2010   Closed fracture of fifth metatarsal bone 07/10/2010   Diagnosis unknown 02/27/2010   Hip pain 06/08/2008   Pulmonary nodule seen on imaging study 06/08/2008    PCP: Watt Raisin, MD  REFERRING PROVIDER: Charlie Dolores, MD, [facility]  REFERRING DIAG: degeneration of discs of lumbar spine  THERAPY DIAG:  Chronic bilateral low back pain with right-sided sciatica  Difficulty in walking, not elsewhere classified  Posture imbalance  Rationale for Evaluation and Treatment: Rehabilitation  ONSET DATE: chronic  SUBJECTIVE:   SUBJECTIVE STATEMENT:  back is sore and neuropathy is bad PERTINENT HISTORY: Saw orthopedist, took prednisone, which didn't help, orthopedist may want to do MRI, referred to PT by both PCP and orthopedist, also B degenerative arthritis hips PAIN:  Are you having pain? 7/10  PRECAUTIONS: Fall  RED FLAGS: None   WEIGHT BEARING RESTRICTIONS: No  FALLS:  Has patient fallen in last 6 months? Yes. Number of falls several  LIVING ENVIRONMENT: Lives with: lives with their family and lives with their daughter Lives in: House/apartment Stairs: Yes: Internal: one flight steps; on left going up Has following equipment at home: Environmental consultant - 4 wheeled  OCCUPATION: retired  PLOF: Independent and Independent with basic ADLs  PATIENT GOALS: reduce pain hips and lower back so I can tolerate more activity  NEXT MD VISIT: not scheduled yet  OBJECTIVE:  Note: Objective measures were completed at Evaluation unless otherwise noted.  DIAGNOSTIC FINDINGS: X-ray done 11/23; IMPRESSION: 1. 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 2. Lower lumbar spondylosis.  PATIENT SURVEYS:  Modified Oswestry Modified Oswestry Low Back Pain Disability Questionnaire: 29 / 50 = 58.0 %   COGNITION: Overall cognitive status: Within functional limits for tasks  assessed     SENSATION: Light touch: WFL  EDEMA:  None noted   MUSCLE LENGTH: Hamstrings: Right wfl deg; Left wfl deg Debby test: Right wfl deg; Left wfl deg  POSTURE: scoliotic posture noted concavity L lumbar, pronounced thoracic kyphosis  PALPATION: Not tender with light palpation B gr trochanters hips  LOWER EXTREMITY ROM:B hip ROM wnl, able to achieve tailor position each leg in sitting  LUMBAR ROM: FB limited 15%, also risk of falling pt needed UE support Lumbar ext, 25% with marked side bending to L with this motion SB B 35% B with central lower back pain LOWER EXTREMITY MMT:  MMT Right eval Left eval  Hip flexion    Hip extension    Hip abduction    Hip adduction    Hip internal rotation    Hip external rotation    Knee flexion    Knee extension    Ankle dorsiflexion    Ankle plantarflexion    Ankle inversion    Ankle eversion     (Blank rows = not tested)   FUNCTIONAL TESTS:  5 times sit to stand: 25 sec, required mod assist of PT to  avoid falling backwards into chair with final attempt  GAIT: Distance walked: in clinic up to 60  Assistive device utilized: Single point cane, Walker - 2 wheeled, and none Level of assistance: Min A without device and with cane Comments: very unstable without device                                                                                                                                 TREATMENT DATE:   01/25/24 Nustep L 5 STS wt ball CGA 10 x, cued to not use legs to brace on mat Standing on airex CGA shld ext and row 10 x Standing on airex HHA alt LE 20 x marching,SL hip flex,ext and abd Standing HHA marching fwd and back 20 feet 2 x each and side stepping 20 feet 2 x each Seated resisted trunk flex and ext 20 x Amb 500 feet HHA working on upright trunk, and cadence and she did very well- 2 brief standing rest breaks   01/18/24 Nustep L 5 Goals assessed and documented Knee ext 5# 2 set  s10 HS curl 15# 2 sets 10 Resisted seated trunk ext 2 sets 10 Seated row 20# 10 x  15# 10 x 3# ankle wts HHA marcing fwd and back ward 15 feet 3 x each, side stepping 15 feet 3 x each 3# ankle wt HHA 10 x each SL hip flex,ext and abd  12/30/23 Nustep L 5 Goals assessed and documented STS 5x 16.54 sec Foam beam in // bars 3x side stepping and then tandem fwd and back CGA with light HHA STS with ball toss 10 x Ball toss on airex, only 1 LOB 2 sets 10 Knee ext 5# 2 set s10 HS curl 15# 2 sets 10 [employer] 5# 1 lap each hand Resisted seated trunk ext 2 sets 10 Seated row 15# 2 sets 10     12/13/23 Nustep L 5 8 min  Knee ext 5# 2 set s10 HS curl 15# 2 sets 10 STS with 4# chest press 10x ADD ball squeeze 2x15 Hip abd w/annual resistance 2x15 Alt 4in box taps CGA 2x10  12/10/23 STS with 4# chest press 10x Assessed and documented goals Nustep L 5 Resisted gait 20# 3 x 4 ways- min A Knee ext 5# 2 set s10 HS curl 15# 2 sets 10  12/07/23 Resisted trunk flex and ext 20 x Seated resisted row and shld ext 2 sets 10 Nustep L 5 8 min Green tband HS curl 2 sets 10 3# LAQ 2 sets 10 3# HHA SL hip flex,ext and abd 10 x each- decreased RT LE mvmt ADD ball squeeze 15 x    12/02/23 Nustep L 5 BM for vacuuming to decrease strain on back Green tband HS curl,hip flex and clams seated 2 sets 10 ADD ball squeeze 2 sets 10 3# LAQ 2 sets 10 Red tband seated row and shld ext  2 sets 10 Tband trunk ext 2 sets 10 Standing with RW 3# hip ext and 10 x each      09/20/23:   Evaluation, explained to pt that long term management of arthritic changes in hips in spine, should respond well to moderate exercise. Practiced gait with st cane, pt without any marked improvement in her stability with the use of the cane.  Therefore practiced with front wheeled walker, marked improvement in gait pattern . Recommended that she use a front wheeled walker which is more light weight and easier  to maneuver in and out of car for outings, she is a high fall risk.  PATIENT EDUCATION:  Education details: POC, goals Person educated: Patient Education method: Explanation, Demonstration, Tactile cues, and Verbal cues Education comprehension: verbalized understanding and returned demonstration  HOME EXERCISE PROGRAM: Green t band hip abd/ER in sitting and green t band ankle pumps L .   ASSESSMENT:  CLINICAL IMPRESSION:   Pt arrived c/o LBP and neuropathy but overall did very well therapy. Struggled with standing on airex d/t balance issues. Progressed gait working on upright trunk and cadence as she is walking her daughter down the aisle in Dec.  OBJECTIVE IMPAIRMENTS: decreased activity tolerance, decreased balance, decreased knowledge of condition, decreased knowledge of use of DME, decreased mobility, difficulty walking, decreased ROM, decreased strength, decreased safety awareness, impaired perceived functional ability, postural dysfunction, and pain.   ACTIVITY LIMITATIONS: carrying, lifting, bending, standing, squatting, transfers, bed mobility, and locomotion level  PARTICIPATION LIMITATIONS: meal prep, cleaning, laundry, shopping, community activity, and yard work  PERSONAL FACTORS: Age, Behavior pattern, Fitness, Past/current experiences, Time since onset of injury/illness/exacerbation, and 1-2 comorbidities: severe DJD B hips, lumbar region, neuropathy are also affecting patient's functional outcome.   REHAB POTENTIAL: Good  CLINICAL DECISION MAKING: Evolving/moderate complexity  EVALUATION COMPLEXITY: Moderate   GOALS: Goals reviewed with patient? Yes  SHORT TERM GOALS: Target date: 2 weeks 5 5/25 I HEP Baseline: Goal status: MET 10/26/23   LONG TERM GOALS: Target date: 12 weeks, 02/10/24  Modified Oswestry Low Back Pain Disability Questionnaire: 29 / 50 = 58.0 %  Baseline:  Goal status: 11/11/23 progressing  and 11/18/23  11/30/23 progressing 12/10/23 progressing    12/30/23 progressing   01/18/24 progressing  2.  Safe gait with appropriate device , over 350' for safe household distances, without balance loss Baseline:  Goal status: on going 10/26/23  progressing 11/04/23  and 11/11/23  Progressing 11/18/23  progressing 11/30/23 distance met but unsteady  12/10/23  amb without AD 250 feet, no LOB  but instability with lateral sway 12/30/23 progressing 250 feet then slows down and balance decreases 01/18/24 progressing  3.  5 x sit to stand 14.8 sec or less without balance loss Baseline: 25 sec,with UE support B  required mod assist on final rep to avoid fall  Goal status: progressing 10/26/23  progressing 11/05/23  11/18/23 multi tries and posterior lean,progressing  11/30/23 LOB progressing  12/10/23 20 sec 12/30/23 progressing   01/18/24 18.5 sec with 1 x bracing knees    PLAN:  PT FREQUENCY: 2x/week  PT DURATION: 12 weeks  PLANNED INTERVENTIONS: 97110-Therapeutic exercises, 97530- Therapeutic activity, 97112- Neuromuscular re-education, 97535- Self Care, 02859- Manual therapy, G0283- Electrical stimulation (unattended), and 97033- Ionotophoresis 4mg /ml Dexamethasone   PLAN FOR NEXT SESSION:   progress strength,balance and gait    Marjorie Lussier,ANGIE, PTA,  01/25/2024, 11:51 AM [facility] [address] [place], [place], [address] Phone: [phone]  Fax:  [phone]

## 2024-01-26 ENCOUNTER — Ambulatory Visit (INDEPENDENT_AMBULATORY_CARE_PROVIDER_SITE_OTHER): Admitting: Obstetrics and Gynecology

## 2024-01-26 VITALS — BP 149/91 | HR 68 | Ht 61.34 in | Wt 164.0 lb

## 2024-01-26 DIAGNOSIS — Z4689 Encounter for fitting and adjustment of other specified devices: Secondary | ICD-10-CM

## 2024-01-26 DIAGNOSIS — N9089 Other specified noninflammatory disorders of vulva and perineum: Secondary | ICD-10-CM | POA: Diagnosis not present

## 2024-01-26 NOTE — Progress Notes (Signed)
   ESTABLISHED GYNECOLOGY VISIT Chief Complaint  Patient presents with   Gynecologic Exam    Pessary changed     Subjective:  Margaret [name] is a 83 y.o. [phone] presenting for pessary maintenance.  Uses pessary for prolapse for years. No issues. Denies pain or vaginal bleeding.    Review of Systems:   Pertinent items are noted in HPI  Pertinent History Reviewed:  Reviewed past medical,surgical, social and family history.  Reviewed problem list, medications and allergies.  Objective:   Vitals:   01/26/24 1445  BP: (!) 149/91  Pulse: 68  Weight: 164 lb (74.4 kg)  Height: 5' 1.34 (1.558 m)   Physical Examination:   General appearance - well appearing, and in no distress  Mental status - alert, oriented to person, place, and time  Psych:  normal mood and affect  Skin - warm and dry, normal color, no suspicious lesions noted  Pelvic -  VULVA: normal appearing vulva, +1 cm raised dark black brown lesion on right labia, appears to be like a mole, slight irregular border VAGINA: normal appearing vagina, no excoriations or erosions CERVIX: normal without lesions  Pessary removed, cleaned and replaced  Chaperone present for exam  Assessment and Plan:  1. Pessary maintenance (Primary) Uncomplicated removal and replacement Continue pessary F/u 3 months  2. Vulvar lesion Prior notes reviewed and no mention of lesion noted Recommend biopsy to exclude abnormality, patient will consider  No follow-ups on file.  Future Appointments  Date Time Provider Department Center  02/01/2024 11:45 AM Payseur, Jon HERO, PTA [facility]  02/03/2024  2:00 PM Payseur, Jon HERO, PTA [facility]  02/07/2024  1:45 PM Gershon Donnice SAUNDERS, DPM [facility]  02/08/2024 11:45 AM Payseur, Jon HERO, PTA [facility]  02/10/2024  2:45 PM Payseur, Jon HERO, PTA [facility]  02/16/2024  1:20 PM Copland, Harlene BROCKS, MD [id number] [id number]  03/01/2024 12:00 PM Pietro Redell RAMAN, MD  [facility] None  05/15/2024  1:45 PM Gershon Donnice SAUNDERS, DPM [facility]    Margaret ONEIDA Bring, MD, FACOG Obstetrician & Gynecologist, [facility] for [facility], [facility]

## 2024-01-27 ENCOUNTER — Other Ambulatory Visit: Payer: Self-pay | Admitting: Family Medicine

## 2024-01-27 DIAGNOSIS — G8929 Other chronic pain: Secondary | ICD-10-CM

## 2024-01-27 DIAGNOSIS — M5440 Lumbago with sciatica, unspecified side: Secondary | ICD-10-CM

## 2024-01-27 DIAGNOSIS — M25551 Pain in right hip: Secondary | ICD-10-CM

## 2024-01-27 DIAGNOSIS — M159 Polyosteoarthritis, unspecified: Secondary | ICD-10-CM

## 2024-01-27 MED ORDER — HYDROCODONE-ACETAMINOPHEN 7.5-325 MG PO TABS: 1.0000 | ORAL_TABLET | Freq: Three times a day (TID) | ORAL | 0 refills | Status: DC | PRN

## 2024-01-27 NOTE — Telephone Encounter (Signed)
 Copied from CRM [id number]. Topic: Clinical - Medication Refill >> Jan 27, 2024 11:16 AM Burnard DEL wrote: Medication: HYDROcodone -acetaminophen  (NORCO) 7.5-325 MG tablet  Has the patient contacted their pharmacy? Yes (Agent: If no, request that the patient contact the pharmacy for the refill. If patient does not wish to contact the pharmacy document the reason why and proceed with request.) (Agent: If yes, when and what did the pharmacy advise?)  This is the patient's preferred pharmacy:  [facility] [id number] [place], [place] - [address] [place] [address] Phone: [phone] Fax: [phone]  Is this the correct pharmacy for this prescription? Yes If no, delete pharmacy and type the correct one.   Has the prescription been filled recently? No  Is the patient out of the medication? No(4 pills left)  Has the patient been seen for an appointment in the last year OR does the patient have an upcoming appointment? Yes  Can we respond through MyChart? Yes  Agent: Please be advised that Rx refills may take up to 3 business days. We ask that you follow-up with your pharmacy.

## 2024-01-28 ENCOUNTER — Other Ambulatory Visit: Payer: Self-pay | Admitting: Family Medicine

## 2024-01-28 DIAGNOSIS — I10 Essential (primary) hypertension: Secondary | ICD-10-CM

## 2024-01-28 MED ORDER — HYDROCHLOROTHIAZIDE 12.5 MG PO TABS
12.5000 mg | ORAL_TABLET | Freq: Every day | ORAL | 0 refills | Status: DC | PRN
Start: 2024-01-28 — End: 2024-04-25

## 2024-01-28 NOTE — Telephone Encounter (Signed)
 RX sent.  Copied from CRM [id number]. Topic: Clinical - Prescription Issue >> Jan 28, 2024  3:35 PM Robinson H wrote: Reason for CRM: Hosey from [employer] calling regarding patients hydrochlorothiazide  (HYDRODIURIL ) 12.5 MG tablet states it was denied for being refilled to soon, but states patient had 90 pills last refill and it's time and patient is out of medication.  Hosey Lesches Teether [phone]

## 2024-02-01 ENCOUNTER — Ambulatory Visit: Attending: Family Medicine | Admitting: Physical Therapy

## 2024-02-01 DIAGNOSIS — R262 Difficulty in walking, not elsewhere classified: Secondary | ICD-10-CM | POA: Insufficient documentation

## 2024-02-01 DIAGNOSIS — M5442 Lumbago with sciatica, left side: Secondary | ICD-10-CM | POA: Diagnosis not present

## 2024-02-01 DIAGNOSIS — G8929 Other chronic pain: Secondary | ICD-10-CM | POA: Insufficient documentation

## 2024-02-01 DIAGNOSIS — M5441 Lumbago with sciatica, right side: Secondary | ICD-10-CM | POA: Insufficient documentation

## 2024-02-01 DIAGNOSIS — R293 Abnormal posture: Secondary | ICD-10-CM | POA: Insufficient documentation

## 2024-02-01 NOTE — Therapy (Signed)
 OUTPATIENT PHYSICAL THERAPY LOWER EXTREMITY    Patient Name: Margaret [name] MRN: [id number] DOB:[birth year], 83 y.o., female Today's Date: 02/01/2024  END OF SESSION:  PT End of Session - 02/01/24 1142     Visit Number 19    Date for PT Re-Evaluation 02/10/24    PT Start Time 1145    PT Stop Time 1230    PT Time Calculation (min) 45 min           Past Medical History:  Diagnosis Date   Anxiety    Arthritis    Cancer (HCC)    lt. foot melanoma   Chronic kidney disease    lt. kidney cyst   Coronary artery disease    GERD (gastroesophageal reflux disease)    History of healed stress fracture 2013   bilateral feet   Hx of phlebitis    reports remote hx of superfical blood clots never anticoagulated   Hypertension    Osteopenia    Pneumonia    Past Surgical History:  Procedure Laterality Date   CATARACT EXTRACTION W/ INTRAOCULAR LENS IMPLANT Bilateral    CHOLECYSTECTOMY N/A 09/19/2016   Procedure: LAPAROSCOPIC CHOLECYSTECTOMY POSSIBLE INTRAOPERATIVE CHOLANGIOGRAM;  Surgeon: Donnice Bury, MD;  Location: [facility] OR;  Service: General;  Laterality: N/A;   ERCP N/A 09/18/2016   Procedure: ENDOSCOPIC RETROGRADE CHOLANGIOPANCREATOGRAPHY (ERCP);  Surgeon: Victory LITTIE Legrand DOUGLAS, MD;  Location: [facility] ENDOSCOPY;  Service: Endoscopy;  Laterality: N/A;   FOOT SURGERY     patient reports four foot surgeries   FOOT SURGERY Left    bunion, hammer toe surgery   FOOT SURGERY Right    shaved bunion, hammer toe correction   HERNIA REPAIR     KNEE ARTHROSCOPY Right 06/02/1995   REPLACEMENT TOTAL KNEE Left    REPLACEMENT TOTAL KNEE Left 10/30/2012   RETINAL DETACHMENT SURGERY     RETINAL DETACHMENT SURGERY Left 06/01/2006   ROBOTIC ASSITED PARTIAL NEPHRECTOMY Left 06/03/2022   Procedure: XI ROBOTIC ASSITED PARTIAL NEPHRECTOMY WITH ULTRASOUND AND VENTRAL HERNIA REPAIR;  Surgeon: Alvaro Hummer, MD;  Location: [facility] ORS;  Service: Urology;  Laterality: Left;  3 HRS   THYROIDECTOMY, PARTIAL  Right    THYROIDECTOMY, PARTIAL  06/02/2003   TOTAL KNEE ARTHROPLASTY Right 11/13/2019   Procedure: TOTAL KNEE ARTHROPLASTY SDDC;  Surgeon: Melodi Lerner, MD;  Location: [facility] ORS;  Service: Orthopedics;  Laterality: Right;    Patient Active Problem List   Diagnosis Date Noted   Renal mass 06/03/2022   Open wound of left side of back 01/20/2021   [place] 11/18/2020   Lipoma of back 11/18/2020   Lymphedema 11/18/2020   Melanocytic nevi of right upper limb, including shoulder 11/18/2020   Personal history of malignant melanoma of skin 11/18/2020   Sebaceous cyst 11/18/2020   Seborrheic dermatitis 11/18/2020   Pre-diabetes 04/03/2020   OA (osteoarthritis) of knee 11/13/2019   Total knee replacement status, 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   Pain in right knee 08/03/2019   Fluid collection at surgical site 10/20/2018   Encounter for postoperative examination after surgery for malignant neoplasm 09/30/2018   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/2018   Plantar fasciitis of right foot 02/03/2017   Porokeratosis 02/03/2017   Acute gallstone pancreatitis    Calculus of gallbladder with acute cholecystitis and obstruction    LFT elevation    Stress fracture 01/01/2016   Insomnia 12/16/2015   Peripheral neuropathy 10/18/2015   Renal insufficiency 03/06/2015   HTN (hypertension) 03/06/2015   Osteopenia 03/06/2015   GERD (  gastroesophageal reflux disease) 03/06/2015   Vaginal pessary present 03/06/2015   Metatarsalgia 07/13/2012   Increased frequency of urination 04/13/2012   DDD (degenerative disc disease), cervical 01/19/2012   Knee stiffness 01/19/2012   Low back pain 01/19/2012   Neck pain 01/19/2012   Pain of hand 01/19/2012   Hallux valgus 09/20/2011   Hyperkeratosis 08/13/2011   Anxiety disorder 07/06/2011   Depression 07/06/2011   Diverticulosis of large intestine 07/06/2011   Non-toxic multinodular goiter 07/06/2011   Atrophic vaginitis 10/24/2010   Dermatographic urticaria  09/17/2010   Inflamed seborrheic keratosis 09/17/2010   Closed fracture of fifth metatarsal bone 07/10/2010   Diagnosis unknown 02/27/2010   Hip pain 06/08/2008   Pulmonary nodule seen on imaging study 06/08/2008    PCP: Watt Raisin, MD  REFERRING PROVIDER: Charlie Dolores, MD, [facility]  REFERRING DIAG: degeneration of discs of lumbar spine  THERAPY DIAG:  Chronic bilateral low back pain with right-sided sciatica  Difficulty in walking, not elsewhere classified  Posture imbalance  Chronic bilateral low back pain with left-sided sciatica  Rationale for Evaluation and Treatment: Rehabilitation  ONSET DATE: chronic  SUBJECTIVE:   SUBJECTIVE STATEMENT:  did some cleaning yesterday and bending aggravated back PERTINENT HISTORY: Saw orthopedist, took prednisone, which didn't help, orthopedist may want to do MRI, referred to PT by both PCP and orthopedist, also B degenerative arthritis hips PAIN:  Are you having pain? 7/10  PRECAUTIONS: Fall  RED FLAGS: None   WEIGHT BEARING RESTRICTIONS: No  FALLS:  Has patient fallen in last 6 months? Yes. Number of falls several  LIVING ENVIRONMENT: Lives with: lives with their family and lives with their daughter Lives in: House/apartment Stairs: Yes: Internal: one flight steps; on left going up Has following equipment at home: Environmental consultant - 4 wheeled  OCCUPATION: retired  PLOF: Independent and Independent with basic ADLs  PATIENT GOALS: reduce pain hips and lower back so I can tolerate more activity  NEXT MD VISIT: not scheduled yet  OBJECTIVE:  Note: Objective measures were completed at Evaluation unless otherwise noted.  DIAGNOSTIC FINDINGS: X-ray done 11/23; IMPRESSION: 1. 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 2. Lower lumbar spondylosis.  PATIENT SURVEYS:  Modified Oswestry Modified Oswestry Low Back Pain Disability Questionnaire: 29 / 50 = 58.0 %   COGNITION: Overall  cognitive status: Within functional limits for tasks assessed     SENSATION: Light touch: WFL  EDEMA:  None noted   MUSCLE LENGTH: Hamstrings: Right wfl deg; Left wfl deg Debby test: Right wfl deg; Left wfl deg  POSTURE: scoliotic posture noted concavity L lumbar, pronounced thoracic kyphosis  PALPATION: Not tender with light palpation B gr trochanters hips  LOWER EXTREMITY ROM:B hip ROM wnl, able to achieve tailor position each leg in sitting  LUMBAR ROM: FB limited 15%, also risk of falling pt needed UE support Lumbar ext, 25% with marked side bending to L with this motion SB B 35% B with central lower back pain LOWER EXTREMITY MMT:  MMT Right eval Left eval  Hip flexion    Hip extension    Hip abduction    Hip adduction    Hip internal rotation    Hip external rotation    Knee flexion    Knee extension    Ankle dorsiflexion    Ankle plantarflexion    Ankle inversion    Ankle eversion     (Blank rows = not tested)   FUNCTIONAL TESTS:  5 times sit  to stand: 25 sec, required mod assist of PT to avoid falling backwards into chair with final attempt  GAIT: Distance walked: in clinic up to 60  Assistive device utilized: Single point cane, Walker - 2 wheeled, and none Level of assistance: Min A without device and with cane Comments: very unstable without device                                                                                                                                 TREATMENT DATE:   02/01/24 Nustep L 5 10 min Amb HHA 5 min outside working on upright posture and cadence Trunk flex and ext 20 x blue tband STS wt ball 10x  HHA on airex stepping on and off fwd,SW and back 10 x each HS curl 25# 2 sets 10 Knee ext 5# 2 sets 10     01/25/24 Nustep L 5 STS wt ball CGA 10 x, cued to not use legs to brace on mat Standing on airex CGA shld ext and row 10 x Standing on airex HHA alt LE 20 x marching,SL hip flex,ext and abd Standing HHA  marching fwd and back 20 feet 2 x each and side stepping 20 feet 2 x each Seated resisted trunk flex and ext 20 x Amb 500 feet HHA working on upright trunk, and cadence and she did very well- 2 brief standing rest breaks   01/18/24 Nustep L 5 Goals assessed and documented Knee ext 5# 2 set s10 HS curl 15# 2 sets 10 Resisted seated trunk ext 2 sets 10 Seated row 20# 10 x  15# 10 x 3# ankle wts HHA marcing fwd and back ward 15 feet 3 x each, side stepping 15 feet 3 x each 3# ankle wt HHA 10 x each SL hip flex,ext and abd  12/30/23 Nustep L 5 Goals assessed and documented STS 5x 16.54 sec Foam beam in // bars 3x side stepping and then tandem fwd and back CGA with light HHA STS with ball toss 10 x Ball toss on airex, only 1 LOB 2 sets 10 Knee ext 5# 2 set s10 HS curl 15# 2 sets 10 [employer] 5# 1 lap each hand Resisted seated trunk ext 2 sets 10 Seated row 15# 2 sets 10     12/13/23 Nustep L 5 8 min  Knee ext 5# 2 set s10 HS curl 15# 2 sets 10 STS with 4# chest press 10x ADD ball squeeze 2x15 Hip abd w/annual resistance 2x15 Alt 4in box taps CGA 2x10  12/10/23 STS with 4# chest press 10x Assessed and documented goals Nustep L 5 Resisted gait 20# 3 x 4 ways- min A Knee ext 5# 2 set s10 HS curl 15# 2 sets 10  12/07/23 Resisted trunk flex and ext 20 x Seated resisted row and shld ext 2 sets 10 Nustep L 5 8 min Green tband HS curl 2 sets 10  3# LAQ 2 sets 10 3# HHA SL hip flex,ext and abd 10 x each- decreased RT LE mvmt ADD ball squeeze 15 x    12/02/23 Nustep L 5 BM for vacuuming to decrease strain on back Green tband HS curl,hip flex and clams seated 2 sets 10 ADD ball squeeze 2 sets 10 3# LAQ 2 sets 10 Red tband seated row and shld ext 2 sets 10 Tband trunk ext 2 sets 10 Standing with RW 3# hip ext and 10 x each      09/20/23:   Evaluation, explained to pt that long term management of arthritic changes in hips in spine, should respond  well to moderate exercise. Practiced gait with st cane, pt without any marked improvement in her stability with the use of the cane.  Therefore practiced with front wheeled walker, marked improvement in gait pattern . Recommended that she use a front wheeled walker which is more light weight and easier to maneuver in and out of car for outings, she is a high fall risk.  PATIENT EDUCATION:  Education details: POC, goals Person educated: Patient Education method: Explanation, Demonstration, Tactile cues, and Verbal cues Education comprehension: verbalized understanding and returned demonstration  HOME EXERCISE PROGRAM: Green t band hip abd/ER in sitting and green t band ankle pumps L .   ASSESSMENT:  CLINICAL IMPRESSION:    pt continues to show good steady progress and is very pleased. Walking better, more upright without AD most times. Trying to be more active at home and is but will some pain with activity. Decreased balance with higher level activities  OBJECTIVE IMPAIRMENTS: decreased activity tolerance, decreased balance, decreased knowledge of condition, decreased knowledge of use of DME, decreased mobility, difficulty walking, decreased ROM, decreased strength, decreased safety awareness, impaired perceived functional ability, postural dysfunction, and pain.   ACTIVITY LIMITATIONS: carrying, lifting, bending, standing, squatting, transfers, bed mobility, and locomotion level  PARTICIPATION LIMITATIONS: meal prep, cleaning, laundry, shopping, community activity, and yard work  PERSONAL FACTORS: Age, Behavior pattern, Fitness, Past/current experiences, Time since onset of injury/illness/exacerbation, and 1-2 comorbidities: severe DJD B hips, lumbar region, neuropathy are also affecting patient's functional outcome.   REHAB POTENTIAL: Good  CLINICAL DECISION MAKING: Evolving/moderate complexity  EVALUATION COMPLEXITY: Moderate   GOALS: Goals reviewed with patient? Yes  SHORT TERM  GOALS: Target date: 2 weeks 5 5/25 I HEP Baseline: Goal status: MET 10/26/23   LONG TERM GOALS: Target date: 12 weeks, 02/10/24  Modified Oswestry Low Back Pain Disability Questionnaire: 29 / 50 = 58.0 %  Baseline:  Goal status: 11/11/23 progressing  and 11/18/23  11/30/23 progressing 12/10/23 progressing   12/30/23 progressing   01/18/24 progressing  2.  Safe gait with appropriate device , over 350' for safe household distances, without balance loss Baseline:  Goal status: on going 10/26/23  progressing 11/04/23  and 11/11/23  Progressing 11/18/23  progressing 11/30/23 distance met but unsteady  12/10/23  amb without AD 250 feet, no LOB  but instability with lateral sway 12/30/23 progressing 250 feet then slows down and balance decreases 01/18/24 progressing  Progressing and showing good improvements 02/01/24  3.  5 x sit to stand 14.8 sec or less without balance loss Baseline: 25 sec,with UE support B  required mod assist on final rep to avoid fall  Goal status: progressing 10/26/23  progressing 11/05/23  11/18/23 multi tries and posterior lean,progressing  11/30/23 LOB progressing  12/10/23 20 sec 12/30/23 progressing   01/18/24 18.5 sec with 1 x bracing knees  02/01/24 progressing uses knees to brace with    PLAN:  PT FREQUENCY: 2x/week  PT DURATION: 12 weeks  PLANNED INTERVENTIONS: 97110-Therapeutic exercises, 97530- Therapeutic activity, 97112- Neuromuscular re-education, 97535- Self Care, [id number]- Manual therapy, G0283- Electrical stimulation (unattended), and 97033- Ionotophoresis 4mg /ml Dexamethasone   PLAN FOR NEXT SESSION:   progress strength,balance and gait  BERG    Clodagh Odenthal,ANGIE, PTA,  02/01/2024, 11:45 AM [place] [facility] [address] [facility]. [place], [place], [address] Phone: [phone]   Fax:  [phone]

## 2024-02-03 ENCOUNTER — Ambulatory Visit: Admitting: Physical Therapy

## 2024-02-03 DIAGNOSIS — G8929 Other chronic pain: Secondary | ICD-10-CM

## 2024-02-03 DIAGNOSIS — M5441 Lumbago with sciatica, right side: Secondary | ICD-10-CM | POA: Diagnosis not present

## 2024-02-03 DIAGNOSIS — R262 Difficulty in walking, not elsewhere classified: Secondary | ICD-10-CM

## 2024-02-03 DIAGNOSIS — M5442 Lumbago with sciatica, left side: Secondary | ICD-10-CM | POA: Diagnosis not present

## 2024-02-03 DIAGNOSIS — R293 Abnormal posture: Secondary | ICD-10-CM

## 2024-02-03 NOTE — Therapy (Signed)
 OUTPATIENT PHYSICAL THERAPY LOWER EXTREMITY Progress Note Reporting Period 09/20/23 to 02/03/24  See note below for Objective Data and Assessment of Progress/Goals.       Patient Name: Margaret [name] MRN: [id number] DOB:01/01/1941, 83 y.o., female Today's Date: 02/03/2024  END OF SESSION:  PT End of Session - 02/03/24 1356     Visit Number 20    Date for PT Re-Evaluation 02/10/24    PT Start Time 1355    PT Stop Time 1440    PT Time Calculation (min) 45 min           Past Medical History:  Diagnosis Date   Anxiety    Arthritis    Cancer (HCC)    lt. foot melanoma   Chronic kidney disease    lt. kidney cyst   Coronary artery disease    GERD (gastroesophageal reflux disease)    History of healed stress fracture 2013   bilateral feet   Hx of phlebitis    reports remote hx of superfical blood clots never anticoagulated   Hypertension    Osteopenia    Pneumonia    Past Surgical History:  Procedure Laterality Date   CATARACT EXTRACTION W/ INTRAOCULAR LENS IMPLANT Bilateral    CHOLECYSTECTOMY N/A 09/19/2016   Procedure: LAPAROSCOPIC CHOLECYSTECTOMY POSSIBLE INTRAOPERATIVE CHOLANGIOGRAM;  Surgeon: Donnice Bury, MD;  Location: [facility] OR;  Service: General;  Laterality: N/A;   ERCP N/A 09/18/2016   Procedure: ENDOSCOPIC RETROGRADE CHOLANGIOPANCREATOGRAPHY (ERCP);  Surgeon: Victory LITTIE Legrand DOUGLAS, MD;  Location: [facility] ENDOSCOPY;  Service: Endoscopy;  Laterality: N/A;   FOOT SURGERY     patient reports four foot surgeries   FOOT SURGERY Left    bunion, hammer toe surgery   FOOT SURGERY Right    shaved bunion, hammer toe correction   HERNIA REPAIR     KNEE ARTHROSCOPY Right 06/02/1995   REPLACEMENT TOTAL KNEE Left    REPLACEMENT TOTAL KNEE Left 10/30/2012   RETINAL DETACHMENT SURGERY     RETINAL DETACHMENT SURGERY Left 06/01/2006   ROBOTIC ASSITED PARTIAL NEPHRECTOMY Left 06/03/2022   Procedure: XI ROBOTIC ASSITED PARTIAL NEPHRECTOMY WITH ULTRASOUND AND VENTRAL HERNIA  REPAIR;  Surgeon: Alvaro Hummer, MD;  Location: [facility] ORS;  Service: Urology;  Laterality: Left;  3 HRS   THYROIDECTOMY, PARTIAL Right    THYROIDECTOMY, PARTIAL  06/02/2003   TOTAL KNEE ARTHROPLASTY Right 11/13/2019   Procedure: TOTAL KNEE ARTHROPLASTY SDDC;  Surgeon: Melodi Lerner, MD;  Location: [facility] ORS;  Service: Orthopedics;  Laterality: Right;    Patient Active Problem List   Diagnosis Date Noted   Renal mass 06/03/2022   Open wound of left side of back 01/20/2021   [place] 11/18/2020   Lipoma of back 11/18/2020   Lymphedema 11/18/2020   Melanocytic nevi of right upper limb, including shoulder 11/18/2020   Personal history of malignant melanoma of skin 11/18/2020   Sebaceous cyst 11/18/2020   Seborrheic dermatitis 11/18/2020   Pre-diabetes 04/03/2020   OA (osteoarthritis) of knee 11/13/2019   Total knee replacement status, 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   Pain in right knee 08/03/2019   Fluid collection at surgical site 10/20/2018   Encounter for postoperative examination after surgery for malignant neoplasm 09/30/2018   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/2018   Plantar fasciitis of right foot 02/03/2017   Porokeratosis 02/03/2017   Acute gallstone pancreatitis    Calculus of gallbladder with acute cholecystitis and obstruction    LFT elevation    Stress fracture 01/01/2016   Insomnia 12/16/2015  Peripheral neuropathy 10/18/2015   Renal insufficiency 03/06/2015   HTN (hypertension) 03/06/2015   Osteopenia 03/06/2015   GERD (gastroesophageal reflux disease) 03/06/2015   Vaginal pessary present 03/06/2015   Metatarsalgia 07/13/2012   Increased frequency of urination 04/13/2012   DDD (degenerative disc disease), cervical 01/19/2012   Knee stiffness 01/19/2012   Low back pain 01/19/2012   Neck pain 01/19/2012   Pain of hand 01/19/2012   Hallux valgus 09/20/2011   Hyperkeratosis 08/13/2011   Anxiety disorder 07/06/2011   Depression 07/06/2011   Diverticulosis of  large intestine 07/06/2011   Non-toxic multinodular goiter 07/06/2011   Atrophic vaginitis 10/24/2010   Dermatographic urticaria 09/17/2010   Inflamed seborrheic keratosis 09/17/2010   Closed fracture of fifth metatarsal bone 07/10/2010   Diagnosis unknown 02/27/2010   Hip pain 06/08/2008   Pulmonary nodule seen on imaging study 06/08/2008    PCP: Watt Raisin, MD  REFERRING PROVIDER: Charlie Dolores, MD, [facility]  REFERRING DIAG: degeneration of discs of lumbar spine  THERAPY DIAG:  Chronic bilateral low back pain with right-sided sciatica  Difficulty in walking, not elsewhere classified  Posture imbalance  Chronic bilateral low back pain with left-sided sciatica  Rationale for Evaluation and Treatment: Rehabilitation  ONSET DATE: chronic  SUBJECTIVE:   SUBJECTIVE STATEMENT:  feel better, thighs sore after last session.walking upright and pleased PERTINENT HISTORY: Saw orthopedist, took prednisone, which didn't help, orthopedist may want to do MRI, referred to PT by both PCP and orthopedist, also B degenerative arthritis hips PAIN:  Are you having pain? 4/10 back and thighs  PRECAUTIONS: Fall  RED FLAGS: None   WEIGHT BEARING RESTRICTIONS: No  FALLS:  Has patient fallen in last 6 months? Yes. Number of falls several  LIVING ENVIRONMENT: Lives with: lives with their family and lives with their daughter Lives in: House/apartment Stairs: Yes: Internal: one flight steps; on left going up Has following equipment at home: Environmental consultant - 4 wheeled  OCCUPATION: retired  PLOF: Independent and Independent with basic ADLs  PATIENT GOALS: reduce pain hips and lower back so I can tolerate more activity  NEXT MD VISIT: not scheduled yet  OBJECTIVE:  Note: Objective measures were completed at Evaluation unless otherwise noted.  DIAGNOSTIC FINDINGS: X-ray done 11/23; IMPRESSION: 1. 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 2.  Lower lumbar spondylosis.  PATIENT SURVEYS:  Modified Oswestry Modified Oswestry Low Back Pain Disability Questionnaire: 29 / 50 = 58.0 %   COGNITION: Overall cognitive status: Within functional limits for tasks assessed     SENSATION: Light touch: WFL  EDEMA:  None noted   MUSCLE LENGTH: Hamstrings: Right wfl deg; Left wfl deg Debby test: Right wfl deg; Left wfl deg  POSTURE: scoliotic posture noted concavity L lumbar, pronounced thoracic kyphosis  PALPATION: Not tender with light palpation B gr trochanters hips  LOWER EXTREMITY ROM:B hip ROM wnl, able to achieve tailor position each leg in sitting  LUMBAR ROM: FB limited 15%, also risk of falling pt needed UE support Lumbar ext, 25% with marked side bending to L with this motion SB B 35% B with central lower back pain LOWER EXTREMITY MMT:  MMT Right eval Left eval  Hip flexion    Hip extension    Hip abduction    Hip adduction    Hip internal rotation    Hip external rotation    Knee flexion    Knee extension    Ankle dorsiflexion    Ankle plantarflexion  Ankle inversion    Ankle eversion     (Blank rows = not tested)   FUNCTIONAL TESTS:  5 times sit to stand: 25 sec, required mod assist of PT to avoid falling backwards into chair with final attempt  GAIT: Distance walked: in clinic up to 60  Assistive device utilized: Single point cane, Walker - 2 wheeled, and none Level of assistance: Min A without device and with cane Comments: very unstable without device                                                                                                                                 TREATMENT DATE:   02/03/24 Nustep L 5 10 min BERG   36/56 Ball toss on airex CGA with 2 LOB STS from elevated mat 2 sets 5 on airex min A Obstacle course stepping on over and around various objects 6 in step taps 10 x each leg then alt CG-min A      02/01/24 Nustep L 5 10 min Amb HHA 5 min outside working on  upright posture and cadence Trunk flex and ext 20 x blue tband STS wt ball 10x  HHA on airex stepping on and off fwd,SW and back 10 x each HS curl 25# 2 sets 10 Knee ext 5# 2 sets 10     01/25/24 Nustep L 5 STS wt ball CGA 10 x, cued to not use legs to brace on mat Standing on airex CGA shld ext and row 10 x Standing on airex HHA alt LE 20 x marching,SL hip flex,ext and abd Standing HHA marching fwd and back 20 feet 2 x each and side stepping 20 feet 2 x each Seated resisted trunk flex and ext 20 x Amb 500 feet HHA working on upright trunk, and cadence and she did very well- 2 brief standing rest breaks   01/18/24 Nustep L 5 Goals assessed and documented Knee ext 5# 2 set s10 HS curl 15# 2 sets 10 Resisted seated trunk ext 2 sets 10 Seated row 20# 10 x  15# 10 x 3# ankle wts HHA marcing fwd and back ward 15 feet 3 x each, side stepping 15 feet 3 x each 3# ankle wt HHA 10 x each SL hip flex,ext and abd  12/30/23 Nustep L 5 Goals assessed and documented STS 5x 16.54 sec Foam beam in // bars 3x side stepping and then tandem fwd and back CGA with light HHA STS with ball toss 10 x Ball toss on airex, only 1 LOB 2 sets 10 Knee ext 5# 2 set s10 HS curl 15# 2 sets 10 [employer] 5# 1 lap each hand Resisted seated trunk ext 2 sets 10 Seated row 15# 2 sets 10     12/13/23 Nustep L 5 8 min  Knee ext 5# 2 set s10 HS curl 15# 2 sets 10 STS with 4# chest press 10x ADD ball squeeze  2x15 Hip abd w/annual resistance 2x15 Alt 4in box taps CGA 2x10  12/10/23 STS with 4# chest press 10x Assessed and documented goals Nustep L 5 Resisted gait 20# 3 x 4 ways- min A Knee ext 5# 2 set s10 HS curl 15# 2 sets 10  12/07/23 Resisted trunk flex and ext 20 x Seated resisted row and shld ext 2 sets 10 Nustep L 5 8 min Green tband HS curl 2 sets 10 3# LAQ 2 sets 10 3# HHA SL hip flex,ext and abd 10 x each- decreased RT LE mvmt ADD ball squeeze 15  x    12/02/23 Nustep L 5 BM for vacuuming to decrease strain on back Green tband HS curl,hip flex and clams seated 2 sets 10 ADD ball squeeze 2 sets 10 3# LAQ 2 sets 10 Red tband seated row and shld ext 2 sets 10 Tband trunk ext 2 sets 10 Standing with RW 3# hip ext and 10 x each      09/20/23:   Evaluation, explained to pt that long term management of arthritic changes in hips in spine, should respond well to moderate exercise. Practiced gait with st cane, pt without any marked improvement in her stability with the use of the cane.  Therefore practiced with front wheeled walker, marked improvement in gait pattern . Recommended that she use a front wheeled walker which is more light weight and easier to maneuver in and out of car for outings, she is a high fall risk.  PATIENT EDUCATION:  Education details: POC, goals Person educated: Patient Education method: Explanation, Demonstration, Tactile cues, and Verbal cues Education comprehension: verbalized understanding and returned demonstration  HOME EXERCISE PROGRAM: Green t band hip abd/ER in sitting and green t band ankle pumps L .   ASSESSMENT:  CLINICAL IMPRESSION:    pt continues to show good steady progress and is very pleased. Walking better, more upright without AD most times. Trying to be more active at home and is but with some increased  pain with/after activity. Decreased balance with higher level activities. BERG 36/56 so pt is at high fall risk.Goals assessed and documented. Pt will continue to benefit form skilled therpay to address deficients and make her as independent as possible to decrease burden on famiy/friends.  OBJECTIVE IMPAIRMENTS: decreased activity tolerance, decreased balance, decreased knowledge of condition, decreased knowledge of use of DME, decreased mobility, difficulty walking, decreased ROM, decreased strength, decreased safety awareness, impaired perceived functional ability, postural  dysfunction, and pain.   ACTIVITY LIMITATIONS: carrying, lifting, bending, standing, squatting, transfers, bed mobility, and locomotion level  PARTICIPATION LIMITATIONS: meal prep, cleaning, laundry, shopping, community activity, and yard work  PERSONAL FACTORS: Age, Behavior pattern, Fitness, Past/current experiences, Time since onset of injury/illness/exacerbation, and 1-2 comorbidities: severe DJD B hips, lumbar region, neuropathy are also affecting patient's functional outcome.   REHAB POTENTIAL: Good  CLINICAL DECISION MAKING: Evolving/moderate complexity  EVALUATION COMPLEXITY: Moderate   GOALS: Goals reviewed with patient? Yes  SHORT TERM GOALS: Target date: 2 weeks 5 5/25 I HEP Baseline: Goal status: MET 10/26/23   LONG TERM GOALS: Target date: 12 weeks, 02/10/24  Modified Oswestry Low Back Pain Disability Questionnaire: 29 / 50 = 58.0 %  Baseline:  Goal status: 11/11/23 progressing  and 11/18/23  11/30/23 progressing 12/10/23 progressing   12/30/23 progressing   01/18/24 progressing  2.  Safe gait with appropriate device , over 350' for safe household distances, without balance loss Baseline:  Goal status: on going 10/26/23  progressing 11/04/23  and 11/11/23  Progressing 11/18/23  progressing 11/30/23 distance met but unsteady  12/10/23  amb without AD 250 feet, no LOB  but instability with lateral sway 12/30/23 progressing 250 feet then slows down and balance decreases 01/18/24 progressing  Progressing and showing good improvements 02/01/24  3.  5 x sit to stand 14.8 sec or less without balance loss Baseline: 25 sec,with UE support B  required mod assist on final rep to avoid fall  Goal status: progressing 10/26/23  progressing 11/05/23  11/18/23 multi tries and posterior lean,progressing  11/30/23 LOB progressing  12/10/23 20 sec 12/30/23 progressing   01/18/24 18.5 sec with 1 x bracing knees 02/01/24 progressing uses knees to brace with    PLAN:  PT FREQUENCY: 2x/week  PT DURATION: 12  weeks  PLANNED INTERVENTIONS: 97110-Therapeutic exercises, 97530- Therapeutic activity, 97112- Neuromuscular re-education, 97535- Self Care, [id number]- Manual therapy, G0283- Electrical stimulation (unattended), and 97033- Ionotophoresis 4mg /ml Dexamethasone   PLAN FOR NEXT SESSION:   progress strength,balance and gait     Kimberlye Dilger,ANGIE, PTA,  02/03/2024, 2:35 PM [facility]. [place], [place], [address] Phone: [phone]   Fax:  [phone]

## 2024-02-04 DIAGNOSIS — Y32XXXA Crashing of motor vehicle, undetermined intent, initial encounter: Secondary | ICD-10-CM | POA: Diagnosis not present

## 2024-02-04 DIAGNOSIS — M542 Cervicalgia: Secondary | ICD-10-CM | POA: Diagnosis not present

## 2024-02-07 ENCOUNTER — Ambulatory Visit (INDEPENDENT_AMBULATORY_CARE_PROVIDER_SITE_OTHER): Admitting: Podiatry

## 2024-02-07 DIAGNOSIS — I8390 Asymptomatic varicose veins of unspecified lower extremity: Secondary | ICD-10-CM | POA: Diagnosis not present

## 2024-02-07 DIAGNOSIS — M216X1 Other acquired deformities of right foot: Secondary | ICD-10-CM | POA: Diagnosis not present

## 2024-02-07 DIAGNOSIS — G629 Polyneuropathy, unspecified: Secondary | ICD-10-CM | POA: Diagnosis not present

## 2024-02-07 DIAGNOSIS — Z85820 Personal history of malignant melanoma of skin: Secondary | ICD-10-CM | POA: Diagnosis not present

## 2024-02-07 DIAGNOSIS — L578 Other skin changes due to chronic exposure to nonionizing radiation: Secondary | ICD-10-CM | POA: Diagnosis not present

## 2024-02-07 DIAGNOSIS — D2261 Melanocytic nevi of right upper limb, including shoulder: Secondary | ICD-10-CM | POA: Diagnosis not present

## 2024-02-07 DIAGNOSIS — D2272 Melanocytic nevi of left lower limb, including hip: Secondary | ICD-10-CM | POA: Diagnosis not present

## 2024-02-07 DIAGNOSIS — L814 Other melanin hyperpigmentation: Secondary | ICD-10-CM | POA: Diagnosis not present

## 2024-02-07 DIAGNOSIS — L821 Other seborrheic keratosis: Secondary | ICD-10-CM | POA: Diagnosis not present

## 2024-02-07 DIAGNOSIS — D2372 Other benign neoplasm of skin of left lower limb, including hip: Secondary | ICD-10-CM

## 2024-02-07 DIAGNOSIS — D485 Neoplasm of uncertain behavior of skin: Secondary | ICD-10-CM | POA: Diagnosis not present

## 2024-02-07 DIAGNOSIS — L820 Inflamed seborrheic keratosis: Secondary | ICD-10-CM | POA: Diagnosis not present

## 2024-02-07 DIAGNOSIS — D225 Melanocytic nevi of trunk: Secondary | ICD-10-CM | POA: Diagnosis not present

## 2024-02-07 NOTE — Progress Notes (Unsigned)
 Subjective: Chief Complaint  Patient presents with   Peripheral Neuropathy    Pt stated that she is no better or no worse things are pretty much the same    83 year old female presents for follow-up evaluation of foot pain.  She states her neuropathy is actually better.  She has been using topical cream a little bit more frequently which has been helping and commendation with the oral medication.  She still gets some calluses to her feet but seem to be doing better but no open lesions or any drainage.  She has questions about the color of her feet.   Objective: AAO x3, NAD DP/PT pulses palpable bilaterally, CRT less than 3 seconds Varicose veins are present with venous insufficiency noted. Sensation decreased with Semmes Weinstein monofilament. Minimal callus formation submetatarsal bilateral without any underlying ulceration, drainage or signs of infection.  No pigment changes noted.  There is no open lesions identified.  This appears to be a recurrent callus on the bottom of the left foot as opposed to any verruca.  Appears different than what it did prior to surgery and I think this coming more from scar tissue as well as the prominence of metatarsal heads. Bunion, digital contractures present.  Prominent metatarsal heads noted. No pain with calf compression, swelling, warmth, erythema  Assessment: Skin lesion left foot; metatarsalgia; skin discoloration  Plan:  Left foot - As a courtesy debrided lesion with any complications or bleeding x 2.  Overall improving.  Continue moisturizer, offloading.  Neuropathy - Continue gabapentin  - Continue compound cream through [employer]. - Physical therapy for her back which has been helping as well  Skin discoloration - Left resulted venous insufficiency, varicose veins.  Discussed referral to vein and vascular however she wants to hold off on this for now.  Discussed compression, elevation.  Return in about 4 weeks (around  03/06/2024).  Margaret [name] Fees DPM

## 2024-02-08 ENCOUNTER — Ambulatory Visit: Admitting: Physical Therapy

## 2024-02-08 DIAGNOSIS — R293 Abnormal posture: Secondary | ICD-10-CM

## 2024-02-08 DIAGNOSIS — M5442 Lumbago with sciatica, left side: Secondary | ICD-10-CM | POA: Diagnosis not present

## 2024-02-08 DIAGNOSIS — G8929 Other chronic pain: Secondary | ICD-10-CM | POA: Diagnosis not present

## 2024-02-08 DIAGNOSIS — R262 Difficulty in walking, not elsewhere classified: Secondary | ICD-10-CM | POA: Diagnosis not present

## 2024-02-08 DIAGNOSIS — M5441 Lumbago with sciatica, right side: Secondary | ICD-10-CM | POA: Diagnosis not present

## 2024-02-08 NOTE — Therapy (Signed)
 OUTPATIENT PHYSICAL THERAPY LOWER EXTREMITY .       Patient Name: PANHIA KARL MRN: [id number] DOB:12-02-1940, 83 y.o., female Today's Date: 02/08/2024  END OF SESSION:  PT End of Session - 02/08/24 1157     Visit Number 21    Date for PT Re-Evaluation 02/10/24    PT Start Time 1145    PT Stop Time 1230    PT Time Calculation (min) 45 min           Past Medical History:  Diagnosis Date   Anxiety    Arthritis    Cancer (HCC)    lt. foot melanoma   Chronic kidney disease    lt. kidney cyst   Coronary artery disease    GERD (gastroesophageal reflux disease)    History of healed stress fracture 2013   bilateral feet   Hx of phlebitis    reports remote hx of superfical blood clots never anticoagulated   Hypertension    Osteopenia    Pneumonia    Past Surgical History:  Procedure Laterality Date   CATARACT EXTRACTION W/ INTRAOCULAR LENS IMPLANT Bilateral    CHOLECYSTECTOMY N/A 09/19/2016   Procedure: LAPAROSCOPIC CHOLECYSTECTOMY POSSIBLE INTRAOPERATIVE CHOLANGIOGRAM;  Surgeon: Donnice Bury, MD;  Location: [facility] OR;  Service: General;  Laterality: N/A;   ERCP N/A 09/18/2016   Procedure: ENDOSCOPIC RETROGRADE CHOLANGIOPANCREATOGRAPHY (ERCP);  Surgeon: Victory LITTIE Legrand DOUGLAS, MD;  Location: [facility];  Service: Endoscopy;  Laterality: N/A;   FOOT SURGERY     patient reports four foot surgeries   FOOT SURGERY Left    bunion, hammer toe surgery   FOOT SURGERY Right    shaved bunion, hammer toe correction   HERNIA REPAIR     KNEE ARTHROSCOPY Right 06/02/1995   REPLACEMENT TOTAL KNEE Left    REPLACEMENT TOTAL KNEE Left 10/30/2012   RETINAL DETACHMENT SURGERY     RETINAL DETACHMENT SURGERY Left 06/01/2006   ROBOTIC ASSITED PARTIAL NEPHRECTOMY Left 06/03/2022   Procedure: XI ROBOTIC ASSITED PARTIAL NEPHRECTOMY WITH ULTRASOUND AND VENTRAL HERNIA REPAIR;  Surgeon: Alvaro Hummer, MD;  Location: [facility] ORS;  Service: Urology;  Laterality: Left;  3 HRS   THYROIDECTOMY,  PARTIAL Right    THYROIDECTOMY, PARTIAL  06/02/2003   TOTAL KNEE ARTHROPLASTY Right 11/13/2019   Procedure: TOTAL KNEE ARTHROPLASTY SDDC;  Surgeon: Melodi Lerner, MD;  Location: [facility]S;  Service: Orthopedics;  Laterality: Right;    Patient Active Problem List   Diagnosis Date Noted   Renal mass 06/03/2022   Open wound of left side of back 01/20/2021   [place] 11/18/2020   Lipoma of back 11/18/2020   Lymphedema 11/18/2020   Melanocytic nevi of right upper limb, including shoulder 11/18/2020   Personal history of malignant melanoma of skin 11/18/2020   Sebaceous cyst 11/18/2020   Seborrheic dermatitis 11/18/2020   Pre-diabetes 04/03/2020   OA (osteoarthritis) of knee 11/13/2019   Total knee replacement status, 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   Pain in right knee 08/03/2019   Fluid collection at surgical site 10/20/2018   Encounter for postoperative examination after surgery for malignant neoplasm 09/30/2018   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/2018   Plantar fasciitis of right foot 02/03/2017   Porokeratosis 02/03/2017   Acute gallstone pancreatitis    Calculus of gallbladder with acute cholecystitis and obstruction    LFT elevation    Stress fracture 01/01/2016   Insomnia 12/16/2015   Peripheral neuropathy 10/18/2015   Renal insufficiency 03/06/2015   HTN (hypertension) 03/06/2015   Osteopenia  03/06/2015   GERD (gastroesophageal reflux disease) 03/06/2015   Vaginal pessary present 03/06/2015   Metatarsalgia 07/13/2012   Increased frequency of urination 04/13/2012   DDD (degenerative disc disease), cervical 01/19/2012   Knee stiffness 01/19/2012   Low back pain 01/19/2012   Neck pain 01/19/2012   Pain of hand 01/19/2012   Hallux valgus 09/20/2011   Hyperkeratosis 08/13/2011   Anxiety disorder 07/06/2011   Depression 07/06/2011   Diverticulosis of large intestine 07/06/2011   Non-toxic multinodular goiter 07/06/2011   Atrophic vaginitis 10/24/2010   Dermatographic  urticaria 09/17/2010   Inflamed seborrheic keratosis 09/17/2010   Closed fracture of fifth metatarsal bone 07/10/2010   Diagnosis unknown 02/27/2010   Hip pain 06/08/2008   Pulmonary nodule seen on imaging study 06/08/2008    PCP: Watt Raisin, MD  REFERRING PROVIDER: Charlie Dolores, MD, [facility]  REFERRING DIAG: degeneration of discs of lumbar spine  THERAPY DIAG:  Chronic bilateral low back pain with right-sided sciatica  Difficulty in walking, not elsewhere classified  Posture imbalance  Rationale for Evaluation and Treatment: Rehabilitation  ONSET DATE: chronic  SUBJECTIVE:   SUBJECTIVE STATEMENT:  only 2 hours of sleep last night, neuropathy was really bad   PERTINENT HISTORY: Saw orthopedist, took prednisone, which didn't help, orthopedist may want to do MRI, referred to PT by both PCP and orthopedist, also B degenerative arthritis hips PAIN:  Are you having pain? 4/10 back and thighs  PRECAUTIONS: Fall  RED FLAGS: None   WEIGHT BEARING RESTRICTIONS: No  FALLS:  Has patient fallen in last 6 months? Yes. Number of falls several  LIVING ENVIRONMENT: Lives with: lives with their family and lives with their daughter Lives in: House/apartment Stairs: Yes: Internal: one flight steps; on left going up Has following equipment at home: Environmental consultant - 4 wheeled  OCCUPATION: retired  PLOF: Independent and Independent with basic ADLs  PATIENT GOALS: reduce pain hips and lower back so I can tolerate more activity  NEXT MD VISIT: not scheduled yet  OBJECTIVE:  Note: Objective measures were completed at Evaluation unless otherwise noted.  DIAGNOSTIC FINDINGS: X-ray done 11/23; IMPRESSION: 1. 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 2. Lower lumbar spondylosis.  PATIENT SURVEYS:  Modified Oswestry Modified Oswestry Low Back Pain Disability Questionnaire: 29 / 50 = 58.0 %   COGNITION: Overall cognitive status: Within  functional limits for tasks assessed     SENSATION: Light touch: WFL  EDEMA:  None noted   MUSCLE LENGTH: Hamstrings: Right wfl deg; Left wfl deg Debby test: Right wfl deg; Left wfl deg  POSTURE: scoliotic posture noted concavity L lumbar, pronounced thoracic kyphosis  PALPATION: Not tender with light palpation B gr trochanters hips  LOWER EXTREMITY ROM:B hip ROM wnl, able to achieve tailor position each leg in sitting  LUMBAR ROM: FB limited 15%, also risk of falling pt needed UE support Lumbar ext, 25% with marked side bending to L with this motion SB B 35% B with central lower back pain LOWER EXTREMITY MMT:  MMT Right eval Left eval  Hip flexion    Hip extension    Hip abduction    Hip adduction    Hip internal rotation    Hip external rotation    Knee flexion    Knee extension    Ankle dorsiflexion    Ankle plantarflexion    Ankle inversion    Ankle eversion     (Blank rows = not tested)   FUNCTIONAL TESTS:  5 times sit to stand: 25 sec, required mod assist of PT to avoid falling backwards into chair with final attempt  GAIT: Distance walked: in clinic up to 60  Assistive device utilized: Single point cane, Walker - 2 wheeled, and none Level of assistance: Min A without device and with cane Comments: very unstable without device                                                                                                                                 TREATMENT DATE:   02/08/24 Amb outside 300 feet with curb sup-CGA without AD with cuing to increase stride as she was taking short shuffle steps esp as she fatigued Nustep L 6 10 min STS wt ball CGA 10 x Light HHA vectro taps Light HHA stepping fwd and back over 2 in pole 3# HHA marching fwd and back 20 feet 2 x 3# HHA side stepping 20 feet 2 x  02/03/24 Nustep L 5 10 min BERG   36/56 Ball toss on airex CGA with 2 LOB STS from elevated mat 2 sets 5 on airex min A Obstacle course stepping on over  and around various objects 6 in step taps 10 x each leg then alt CG-min A      02/01/24 Nustep L 5 10 min Amb HHA 5 min outside working on upright posture and cadence Trunk flex and ext 20 x blue tband STS wt ball 10x  HHA on airex stepping on and off fwd,SW and back 10 x each HS curl 25# 2 sets 10 Knee ext 5# 2 sets 10     01/25/24 Nustep L 5 STS wt ball CGA 10 x, cued to not use legs to brace on mat Standing on airex CGA shld ext and row 10 x Standing on airex HHA alt LE 20 x marching,SL hip flex,ext and abd Standing HHA marching fwd and back 20 feet 2 x each and side stepping 20 feet 2 x each Seated resisted trunk flex and ext 20 x Amb 500 feet HHA working on upright trunk, and cadence and she did very well- 2 brief standing rest breaks   01/18/24 Nustep L 5 Goals assessed and documented Knee ext 5# 2 set s10 HS curl 15# 2 sets 10 Resisted seated trunk ext 2 sets 10 Seated row 20# 10 x  15# 10 x 3# ankle wts HHA marcing fwd and back ward 15 feet 3 x each, side stepping 15 feet 3 x each 3# ankle wt HHA 10 x each SL hip flex,ext and abd  12/30/23 Nustep L 5 Goals assessed and documented STS 5x 16.54 sec Foam beam in // bars 3x side stepping and then tandem fwd and back CGA with light HHA STS with ball toss 10 x Ball toss on airex, only 1 LOB 2 sets 10 Knee ext 5# 2 set s10 HS curl 15# 2 sets 10 [employer] 5#  1 lap each hand Resisted seated trunk ext 2 sets 10 Seated row 15# 2 sets 10     12/13/23 Nustep L 5 8 min  Knee ext 5# 2 set s10 HS curl 15# 2 sets 10 STS with 4# chest press 10x ADD ball squeeze 2x15 Hip abd w/annual resistance 2x15 Alt 4in box taps CGA 2x10  12/10/23 STS with 4# chest press 10x Assessed and documented goals Nustep L 5 Resisted gait 20# 3 x 4 ways- min A Knee ext 5# 2 set s10 HS curl 15# 2 sets 10  12/07/23 Resisted trunk flex and ext 20 x Seated resisted row and shld ext 2 sets 10 Nustep L 5 8 min Green  tband HS curl 2 sets 10 3# LAQ 2 sets 10 3# HHA SL hip flex,ext and abd 10 x each- decreased RT LE mvmt ADD ball squeeze 15 x    12/02/23 Nustep L 5 BM for vacuuming to decrease strain on back Green tband HS curl,hip flex and clams seated 2 sets 10 ADD ball squeeze 2 sets 10 3# LAQ 2 sets 10 Red tband seated row and shld ext 2 sets 10 Tband trunk ext 2 sets 10 Standing with RW 3# hip ext and 10 x each      09/20/23:   Evaluation, explained to pt that long term management of arthritic changes in hips in spine, should respond well to moderate exercise. Practiced gait with st cane, pt without any marked improvement in her stability with the use of the cane.  Therefore practiced with front wheeled walker, marked improvement in gait pattern . Recommended that she use a front wheeled walker which is more light weight and easier to maneuver in and out of car for outings, she is a high fall risk.  PATIENT EDUCATION:  Education details: POC, goals Person educated: Patient Education method: Explanation, Demonstration, Tactile cues, and Verbal cues Education comprehension: verbalized understanding and returned demonstration  HOME EXERCISE PROGRAM: Green t band hip abd/ER in sitting and green t band ankle pumps L .   ASSESSMENT:  CLINICAL IMPRESSION:    working on gait and balance ex/actvity with cuing to stay upright and take bigger steps cuing and occasional assist needed. OBJECTIVE IMPAIRMENTS: decreased activity tolerance, decreased balance, decreased knowledge of condition, decreased knowledge of use of DME, decreased mobility, difficulty walking, decreased ROM, decreased strength, decreased safety awareness, impaired perceived functional ability, postural dysfunction, and pain.   ACTIVITY LIMITATIONS: carrying, lifting, bending, standing, squatting, transfers, bed mobility, and locomotion level  PARTICIPATION LIMITATIONS: meal prep, cleaning, laundry, shopping, community  activity, and yard work  PERSONAL FACTORS: Age, Behavior pattern, Fitness, Past/current experiences, Time since onset of injury/illness/exacerbation, and 1-2 comorbidities: severe DJD B hips, lumbar region, neuropathy are also affecting patient's functional outcome.   REHAB POTENTIAL: Good  CLINICAL DECISION MAKING: Evolving/moderate complexity  EVALUATION COMPLEXITY: Moderate   GOALS: Goals reviewed with patient? Yes  SHORT TERM GOALS: Target date: 2 weeks 5 5/25 I HEP Baseline: Goal status: MET 10/26/23   LONG TERM GOALS: Target date: 12 weeks, 02/10/24  Modified Oswestry Low Back Pain Disability Questionnaire: 29 / 50 = 58.0 %  Baseline:  Goal status: 11/11/23 progressing  and 11/18/23  11/30/23 progressing 12/10/23 progressing   12/30/23 progressing   01/18/24 progressing  2.  Safe gait with appropriate device , over 350' for safe household distances, without balance loss Baseline:  Goal status: on going 10/26/23  progressing 11/04/23  and 11/11/23  Progressing 11/18/23  progressing 11/30/23 distance met but unsteady  12/10/23  amb without AD 250 feet, no LOB  but instability with lateral sway 12/30/23 progressing 250 feet then slows down and balance decreases 01/18/24 progressing  Progressing and showing good improvements 02/01/24  3.  5 x sit to stand 14.8 sec or less without balance loss Baseline: 25 sec,with UE support B  required mod assist on final rep to avoid fall  Goal status: progressing 10/26/23  progressing 11/05/23  11/18/23 multi tries and posterior lean,progressing  11/30/23 LOB progressing  12/10/23 20 sec 12/30/23 progressing   01/18/24 18.5 sec with 1 x bracing knees 02/01/24 progressing uses knees to brace with    PLAN:  PT FREQUENCY: 2x/week  PT DURATION: 12 weeks  PLANNED INTERVENTIONS: 97110-Therapeutic exercises, 97530- Therapeutic activity, 97112- Neuromuscular re-education, 97535- Self Care, [id number]- Manual therapy, G0283- Electrical stimulation (unattended), and 97033-  Ionotophoresis 4mg /ml Dexamethasone   PLAN FOR NEXT SESSION:  renewal due next visit    Zeppelin Commisso,ANGIE, PTA,  02/08/2024, 11:57 AM [facility] [place], [place], [address] Phone: [phone]   Fax:  [phone]

## 2024-02-10 ENCOUNTER — Ambulatory Visit: Admitting: Physical Therapy

## 2024-02-10 DIAGNOSIS — M5441 Lumbago with sciatica, right side: Secondary | ICD-10-CM | POA: Diagnosis not present

## 2024-02-10 DIAGNOSIS — R262 Difficulty in walking, not elsewhere classified: Secondary | ICD-10-CM | POA: Diagnosis not present

## 2024-02-10 DIAGNOSIS — M5442 Lumbago with sciatica, left side: Secondary | ICD-10-CM | POA: Diagnosis not present

## 2024-02-10 DIAGNOSIS — G8929 Other chronic pain: Secondary | ICD-10-CM

## 2024-02-10 DIAGNOSIS — R293 Abnormal posture: Secondary | ICD-10-CM

## 2024-02-10 NOTE — Therapy (Addendum)
 OUTPATIENT PHYSICAL THERAPY LOWER EXTREMITY .       Patient Name: Margaret [name] MRN: [id number] DOB:1941-01-27, 83 y.o., female Today's Date: 02/10/2024  END OF SESSION:  PT End of Session - 02/10/24 1529     Visit Number 22    Date for PT Re-Evaluation 02/10/24    PT Start Time 1445    PT Stop Time 1525    PT Time Calculation (min) 40 min            Past Medical History:  Diagnosis Date   Anxiety    Arthritis    Cancer (HCC)    lt. foot melanoma   Chronic kidney disease    lt. kidney cyst   Coronary artery disease    GERD (gastroesophageal reflux disease)    History of healed stress fracture 2013   bilateral feet   Hx of phlebitis    reports remote hx of superfical blood clots never anticoagulated   Hypertension    Osteopenia    Pneumonia    Past Surgical History:  Procedure Laterality Date   CATARACT EXTRACTION W/ INTRAOCULAR LENS IMPLANT Bilateral    CHOLECYSTECTOMY N/A 09/19/2016   Procedure: LAPAROSCOPIC CHOLECYSTECTOMY POSSIBLE INTRAOPERATIVE CHOLANGIOGRAM;  Surgeon: Donnice Bury, MD;  Location: [facility] OR;  Service: General;  Laterality: N/A;   ERCP N/A 09/18/2016   Procedure: ENDOSCOPIC RETROGRADE CHOLANGIOPANCREATOGRAPHY (ERCP);  Surgeon: Victory LITTIE Legrand DOUGLAS, MD;  Location: [facility] ENDOSCOPY;  Service: Endoscopy;  Laterality: N/A;   FOOT SURGERY     patient reports four foot surgeries   FOOT SURGERY Left    bunion, hammer toe surgery   FOOT SURGERY Right    shaved bunion, hammer toe correction   HERNIA REPAIR     KNEE ARTHROSCOPY Right 06/02/1995   REPLACEMENT TOTAL KNEE Left    REPLACEMENT TOTAL KNEE Left 10/30/2012   RETINAL DETACHMENT SURGERY     RETINAL DETACHMENT SURGERY Left 06/01/2006   ROBOTIC ASSITED PARTIAL NEPHRECTOMY Left 06/03/2022   Procedure: XI ROBOTIC ASSITED PARTIAL NEPHRECTOMY WITH ULTRASOUND AND VENTRAL HERNIA REPAIR;  Surgeon: Alvaro Hummer, MD;  Location: [facility] ORS;  Service: Urology;  Laterality: Left;  3 HRS   THYROIDECTOMY,  PARTIAL Right    THYROIDECTOMY, PARTIAL  06/02/2003   TOTAL KNEE ARTHROPLASTY Right 11/13/2019   Procedure: TOTAL KNEE ARTHROPLASTY SDDC;  Surgeon: Melodi Lerner, MD;  Location: [facility] ORS;  Service: Orthopedics;  Laterality: Right;    Patient Active Problem List   Diagnosis Date Noted   Renal mass 06/03/2022   Open wound of left side of back 01/20/2021   [place] 11/18/2020   Lipoma of back 11/18/2020   Lymphedema 11/18/2020   Melanocytic nevi of right upper limb, including shoulder 11/18/2020   Personal history of malignant melanoma of skin 11/18/2020   Sebaceous cyst 11/18/2020   Seborrheic dermatitis 11/18/2020   Pre-diabetes 04/03/2020   OA (osteoarthritis) of knee 11/13/2019   Total knee replacement status, 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   Pain in right knee 08/03/2019   Fluid collection at surgical site 10/20/2018   Encounter for postoperative examination after surgery for malignant neoplasm 09/30/2018   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/2018   Plantar fasciitis of right foot 02/03/2017   Porokeratosis 02/03/2017   Acute gallstone pancreatitis    Calculus of gallbladder with acute cholecystitis and obstruction    LFT elevation    Stress fracture 01/01/2016   Insomnia 12/16/2015   Peripheral neuropathy 10/18/2015   Renal insufficiency 03/06/2015   HTN (hypertension) 03/06/2015  Osteopenia 03/06/2015   GERD (gastroesophageal reflux disease) 03/06/2015   Vaginal pessary present 03/06/2015   Metatarsalgia 07/13/2012   Increased frequency of urination 04/13/2012   DDD (degenerative disc disease), cervical 01/19/2012   Knee stiffness 01/19/2012   Low back pain 01/19/2012   Neck pain 01/19/2012   Pain of hand 01/19/2012   Hallux valgus 09/20/2011   Hyperkeratosis 08/13/2011   Anxiety disorder 07/06/2011   Depression 07/06/2011   Diverticulosis of large intestine 07/06/2011   Non-toxic multinodular goiter 07/06/2011   Atrophic vaginitis 10/24/2010   Dermatographic  urticaria 09/17/2010   Inflamed seborrheic keratosis 09/17/2010   Closed fracture of fifth metatarsal bone 07/10/2010   Diagnosis unknown 02/27/2010   Hip pain 06/08/2008   Pulmonary nodule seen on imaging study 06/08/2008    PCP: Watt Raisin, MD  REFERRING PROVIDER: Charlie Dolores, MD, [facility]  REFERRING DIAG: degeneration of discs of lumbar spine  THERAPY DIAG:  Chronic bilateral low back pain with right-sided sciatica  Difficulty in walking, not elsewhere classified  Posture imbalance  Chronic bilateral low back pain with left-sided sciatica  Rationale for Evaluation and Treatment: Rehabilitation  ONSET DATE: chronic  SUBJECTIVE:   SUBJECTIVE STATEMENT     I was so sore after last session   PERTINENT HISTORY: Saw orthopedist, took prednisone, which didn't help, orthopedist may want to do MRI, referred to PT by both PCP and orthopedist, also B degenerative arthritis hips PAIN:  Are you having pain? 4/10 back and thighs  PRECAUTIONS: Fall  RED FLAGS: None   WEIGHT BEARING RESTRICTIONS: No  FALLS:  Has patient fallen in last 6 months? Yes. Number of falls several  LIVING ENVIRONMENT: Lives with: lives with their family and lives with their daughter Lives in: House/apartment Stairs: Yes: Internal: one flight steps; on left going up Has following equipment at home: Environmental consultant - 4 wheeled  OCCUPATION: retired  PLOF: Independent and Independent with basic ADLs  PATIENT GOALS: reduce pain hips and lower back so I can tolerate more activity  NEXT MD VISIT: not scheduled yet  OBJECTIVE:  Note: Objective measures were completed at Evaluation unless otherwise noted.  DIAGNOSTIC FINDINGS: X-ray done 11/23; IMPRESSION: 1. 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 2. Lower lumbar spondylosis.  PATIENT SURVEYS:  Modified Oswestry Modified Oswestry Low Back Pain Disability Questionnaire: 29 / 50 = 58.0 %    COGNITION: Overall cognitive status: Within functional limits for tasks assessed     SENSATION: Light touch: WFL  EDEMA:  None noted   MUSCLE LENGTH: Hamstrings: Right wfl deg; Left wfl deg Debby test: Right wfl deg; Left wfl deg  POSTURE: scoliotic posture noted concavity L lumbar, pronounced thoracic kyphosis  PALPATION: Not tender with light palpation B gr trochanters hips  LOWER EXTREMITY ROM:B hip ROM wnl, able to achieve tailor position each leg in sitting  LUMBAR ROM: FB limited 15%, also risk of falling pt needed UE support Lumbar ext, 25% with marked side bending to L with this motion SB B 35% B with central lower back pain LOWER EXTREMITY MMT:  MMT Right eval Left eval  Hip flexion    Hip extension    Hip abduction    Hip adduction    Hip internal rotation    Hip external rotation    Knee flexion    Knee extension    Ankle dorsiflexion    Ankle plantarflexion    Ankle inversion    Ankle eversion     (Blank  rows = not tested)   FUNCTIONAL TESTS:  5 times sit to stand: 25 sec, required mod assist of PT to avoid falling backwards into chair with final attempt  GAIT: Distance walked: in clinic up to 60  Assistive device utilized: Single point cane, Walker - 2 wheeled, and none Level of assistance: Min A without device and with cane Comments: very unstable without device                                                                                                                                 TREATMENT DATE:   02/10/24 Nustep L 6 10 min STS wt ball chest press 10 x Knee ext 5# 2 sets 10 HS curl 20# 2 sets 10 Leg press 20# 3 sets 10 Tband standing shld ext and row 2 sets 10 Tband seated trunk flex and ext  02/08/24 Amb outside 300 feet with curb sup-CGA without AD with cuing to increase stride as she was taking short shuffle steps esp as she fatigued Nustep L 6 10 min STS wt ball CGA 10 x Light HHA vectro taps Light HHA stepping fwd and  back over 2 in pole 3# HHA marching fwd and back 20 feet 2 x 3# HHA side stepping 20 feet 2 x  02/03/24 Nustep L 5 10 min BERG   36/56 Ball toss on airex CGA with 2 LOB STS from elevated mat 2 sets 5 on airex min A Obstacle course stepping on over and around various objects 6 in step taps 10 x each leg then alt CG-min A      02/01/24 Nustep L 5 10 min Amb HHA 5 min outside working on upright posture and cadence Trunk flex and ext 20 x blue tband STS wt ball 10x  HHA on airex stepping on and off fwd,SW and back 10 x each HS curl 25# 2 sets 10 Knee ext 5# 2 sets 10     01/25/24 Nustep L 5 STS wt ball CGA 10 x, cued to not use legs to brace on mat Standing on airex CGA shld ext and row 10 x Standing on airex HHA alt LE 20 x marching,SL hip flex,ext and abd Standing HHA marching fwd and back 20 feet 2 x each and side stepping 20 feet 2 x each Seated resisted trunk flex and ext 20 x Amb 500 feet HHA working on upright trunk, and cadence and she did very well- 2 brief standing rest breaks   01/18/24 Nustep L 5 Goals assessed and documented Knee ext 5# 2 set s10 HS curl 15# 2 sets 10 Resisted seated trunk ext 2 sets 10 Seated row 20# 10 x  15# 10 x 3# ankle wts HHA marcing fwd and back ward 15 feet 3 x each, side stepping 15 feet 3 x each 3# ankle wt HHA 10 x each SL hip flex,ext and abd  12/30/23 Nustep L 5 Goals  assessed and documented STS 5x 16.54 sec Foam beam in // bars 3x side stepping and then tandem fwd and back CGA with light HHA STS with ball toss 10 x Ball toss on airex, only 1 LOB 2 sets 10 Knee ext 5# 2 set s10 HS curl 15# 2 sets 10 [employer] 5# 1 lap each hand Resisted seated trunk ext 2 sets 10 Seated row 15# 2 sets 10     12/13/23 Nustep L 5 8 min  Knee ext 5# 2 set s10 HS curl 15# 2 sets 10 STS with 4# chest press 10x ADD ball squeeze 2x15 Hip abd w/annual resistance 2x15 Alt 4in box taps CGA 2x10  12/10/23 STS with 4# chest  press 10x Assessed and documented goals Nustep L 5 Resisted gait 20# 3 x 4 ways- min A Knee ext 5# 2 set s10 HS curl 15# 2 sets 10  12/07/23 Resisted trunk flex and ext 20 x Seated resisted row and shld ext 2 sets 10 Nustep L 5 8 min Green tband HS curl 2 sets 10 3# LAQ 2 sets 10 3# HHA SL hip flex,ext and abd 10 x each- decreased RT LE mvmt ADD ball squeeze 15 x    12/02/23 Nustep L 5 BM for vacuuming to decrease strain on back Green tband HS curl,hip flex and clams seated 2 sets 10 ADD ball squeeze 2 sets 10 3# LAQ 2 sets 10 Red tband seated row and shld ext 2 sets 10 Tband trunk ext 2 sets 10 Standing with RW 3# hip ext and 10 x each      09/20/23:   Evaluation, explained to pt that long term management of arthritic changes in hips in spine, should respond well to moderate exercise. Practiced gait with st cane, pt without any marked improvement in her stability with the use of the cane.  Therefore practiced with front wheeled walker, marked improvement in gait pattern . Recommended that she use a front wheeled walker which is more light weight and easier to maneuver in and out of car for outings, she is a high fall risk.  PATIENT EDUCATION:  Education details: POC, goals Person educated: Patient Education method: Explanation, Demonstration, Tactile cues, and Verbal cues Education comprehension: verbalized understanding and returned demonstration  HOME EXERCISE PROGRAM: Green t band hip abd/ER in sitting and green t band ankle pumps L .   ASSESSMENT:  CLINICAL IMPRESSION:    pt arrived very sore from last session. Seems every time we add in more balance she is very sore, maybe from SLS moments. Focus more on strength today. Assessed goals and did renewal today  OBJECTIVE IMPAIRMENTS: decreased activity tolerance, decreased balance, decreased knowledge of condition, decreased knowledge of use of DME, decreased mobility, difficulty walking, decreased ROM,  decreased strength, decreased safety awareness, impaired perceived functional ability, postural dysfunction, and pain.   ACTIVITY LIMITATIONS: carrying, lifting, bending, standing, squatting, transfers, bed mobility, and locomotion level  PARTICIPATION LIMITATIONS: meal prep, cleaning, laundry, shopping, community activity, and yard work  PERSONAL FACTORS: Age, Behavior pattern, Fitness, Past/current experiences, Time since onset of injury/illness/exacerbation, and 1-2 comorbidities: severe DJD B hips, lumbar region, neuropathy are also affecting patient's functional outcome.   REHAB POTENTIAL: Good  CLINICAL DECISION MAKING: Evolving/moderate complexity  EVALUATION COMPLEXITY: Moderate   GOALS: Goals reviewed with patient? Yes  SHORT TERM GOALS: Target date: 2 weeks 5 5/25 I HEP Baseline: Goal status: MET 10/26/23   LONG TERM GOALS: Target date: 12 weeks, 02/10/24  Modified Oswestry Low Back Pain Disability Questionnaire: 29 / 50 = 58.0 %  Baseline:  Goal status: 11/11/23 progressing  and 11/18/23  11/30/23 progressing 12/10/23 progressing   12/30/23 progressing   01/18/24 progressing 02/10/24 MET  2.  Safe gait with appropriate device , over 350' for safe household distances, without balance loss Baseline:  Goal status: on going 10/26/23  progressing 11/04/23  and 11/11/23  Progressing 11/18/23  progressing 11/30/23 distance met but unsteady  12/10/23  amb without AD 250 feet, no LOB  but instability with lateral sway 12/30/23 progressing 250 feet then slows down and balance decreases 01/18/24 progressing  Progressing and showing good improvements 02/01/24 02/10/24 progressing  3.  5 x sit to stand 14.8 sec or less without balance loss Baseline: 25 sec,with UE support B  required mod assist on final rep to avoid fall  Goal status: progressing 10/26/23  progressing 11/05/23  11/18/23 multi tries and posterior lean,progressing  11/30/23 LOB progressing  12/10/23 20 sec 12/30/23 progressing   01/18/24 18.5  sec with 1 x bracing knees 02/01/24 progressing uses knees to brace with 02/10/24 16 sec progressing  4. Will increase BERG score to 45 to demonstrate improved balance   Baseline: 36  Goal status: initial     PLAN:  PT FREQUENCY: 2x/week  PT DURATION: 12 weeks  PLANNED INTERVENTIONS: 97110-Therapeutic exercises, 97530- Therapeutic activity, 97112- Neuromuscular re-education, 97535- Self Care, [id number]- Manual therapy, [id number]- Electrical stimulation (unattended), and 97033- Ionotophoresis 4mg /ml Dexamethasone   PLAN FOR NEXT SESSION:  renewal done and added BERG goal    Almetta Fam, PT,  02/10/2024, 3:44 PM [facility] [address] [place], [place], [address] Phone: [phone]   Fax:  [phone]

## 2024-02-12 NOTE — Progress Notes (Signed)
 [facility] [address] [place], [place] [address] [phone]  Date:  02/16/2024   Name:  Margaret [name]   DOB:  08/01/1940   MRN:  [id number]  PCP:  Watt Harlene BROCKS, MD    Chief Complaint: Hernia (Pt states my urologist noticed it back in July, no pain just aggravation /Flu shot )   History of Present Illness:  Margaret [name] is a 83 y.o. very pleasant female patient who presents with the following:  Patient seen today with concern of a hernia.  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  She is not a good surgical candidate at this time. Her pain is managed with hydrocodone  as well as Tylenol  She saw urology in July-status post left robotic partial nephrectomy in 2024 for clear-cell cancer They did note a ventral hernia, she had a nonmesh repair at the time of her partial nephrectomy.  Looks like the hernia had partially recurred on follow-up imaging  Flu shot- give today  Recommend COVID booster this fall Shingrix is done, recommend RSV Labs done in March  She has an umbilical hernia- Dr Alvaro looked at it at her recent visit and advised leaving it alone unless she develops significant symptoms She has noticed it for 3 months Not painful at all She will have to push it back in which is annoying  She noted her mother had a hernia that strangulated which concerns her   Her granddaughter is getting married this December- Shanitha raised her and will be serving as mother of the bride!    Patient Active Problem List   Diagnosis Date Noted   Renal mass 06/03/2022   Open wound of  left side of back 01/20/2021   Comedone 11/18/2020   Lipoma of back 11/18/2020   Lymphedema 11/18/2020   Melanocytic nevi of right upper limb, including shoulder 11/18/2020   Personal history of malignant melanoma of skin 11/18/2020   Sebaceous cyst 11/18/2020   Seborrheic dermatitis 11/18/2020   Pre-diabetes 04/03/2020   OA (osteoarthritis) of knee 11/13/2019   Total knee replacement status, 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   Pain in right knee 08/03/2019   Fluid collection at surgical site 10/20/2018   Encounter for postoperative examination after surgery for malignant neoplasm 09/30/2018   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/2018   Plantar fasciitis of right foot 02/03/2017   Porokeratosis 02/03/2017   Acute gallstone pancreatitis    Calculus of gallbladder with acute cholecystitis and obstruction    LFT elevation    Stress fracture 01/01/2016   Insomnia 12/16/2015   Peripheral neuropathy 10/18/2015   Renal insufficiency 03/06/2015   HTN (hypertension) 03/06/2015   Osteopenia 03/06/2015   GERD (gastroesophageal reflux disease) 03/06/2015   Vaginal pessary present 03/06/2015   Metatarsalgia 07/13/2012   Increased frequency of urination 04/13/2012   DDD (degenerative disc disease), cervical 01/19/2012   Knee stiffness 01/19/2012   Low back pain 01/19/2012   Neck pain 01/19/2012   Pain of hand 01/19/2012   Hallux valgus 09/20/2011   Hyperkeratosis 08/13/2011   Anxiety disorder 07/06/2011   Depression 07/06/2011   Diverticulosis of large intestine 07/06/2011   Non-toxic multinodular goiter  07/06/2011   Atrophic vaginitis 10/24/2010   Dermatographic urticaria 09/17/2010   Inflamed seborrheic keratosis 09/17/2010   Closed fracture of fifth metatarsal bone 07/10/2010   Diagnosis unknown 02/27/2010   Hip pain 06/08/2008   Pulmonary nodule seen on imaging study 06/08/2008    Past Medical History:  Diagnosis Date   Anxiety    Arthritis    Cancer (HCC)    lt. foot melanoma    Chronic kidney disease    lt. kidney cyst   Coronary artery disease    GERD (gastroesophageal reflux disease)    History of healed stress fracture 2013   bilateral feet   Hx of phlebitis    reports remote hx of superfical blood clots never anticoagulated   Hypertension    Osteopenia    Pneumonia     Past Surgical History:  Procedure Laterality Date   CATARACT EXTRACTION W/ INTRAOCULAR LENS IMPLANT Bilateral    CHOLECYSTECTOMY N/A 09/19/2016   Procedure: LAPAROSCOPIC CHOLECYSTECTOMY POSSIBLE INTRAOPERATIVE CHOLANGIOGRAM;  Surgeon: Donnice Bury, MD;  Location: [facility] OR;  Service: General;  Laterality: N/A;   ERCP N/A 09/18/2016   Procedure: ENDOSCOPIC RETROGRADE CHOLANGIOPANCREATOGRAPHY (ERCP);  Surgeon: Victory LITTIE Legrand DOUGLAS, MD;  Location[facility];  Service: Endoscopy;  Laterality: N/A;   FOOT SURGERY     patient reports four foot surgeries   FOOT SURGERY Left    bunion, hammer toe surgery   FOOT SURGERY Right    shaved bunion, hammer toe correction   HERNIA REPAIR     KNEE ARTHROSCOPY Right 06/02/1995   REPLACEMENT TOTAL KNEE Left    REPLACEMENT TOTAL KNEE Left 10/30/2012   RETINAL DETACHMENT SURGERY     RETINAL DETACHMENT SURGERY Left 06/01/2006   ROBOTIC ASSITED PARTIAL NEPHRECTOMY Left 06/03/2022   Procedure: XI ROBOTIC ASSITED PARTIAL NEPHRECTOMY WITH ULTRASOUND AND VENTRAL HERNIA REPAIR;  Surgeon: Alvaro Hummer, MD;  Location: [facility]S;  Service: Urology;  Laterality: Left;  3 HRS   THYROIDECTOMY, PARTIAL Right    THYROIDECTOMY, PARTIAL  06/02/2003   TOTAL KNEE ARTHROPLASTY Right 11/13/2019   Procedure: TOTAL KNEE ARTHROPLASTY SDDC;  Surgeon: Melodi Lerner, MD;  Location: [facility];  Service: Orthopedics;  Laterality: Right;     Social History   Tobacco Use   Smoking status: Former    Current packs/day: 0.00    Average packs/day: 0.3 packs/day for 15.0 years (3.8 ttl pk-yrs)    Types: Cigarettes    Start date: 06/01/1964    Quit date: 06/02/1979    Years  since quitting: 44.7   Smokeless tobacco: Never  Vaping Use   Vaping status: Never Used  Substance Use Topics   Alcohol use: Yes    Alcohol/week: 1.0 standard drink of alcohol    Types: 1 Glasses of wine per week    Comment: Occ   Drug use: No    Family History  Problem Relation Age of Onset   Heart disease Mother    Heart attack Mother    Heart disease Father    Diabetes Maternal Grandmother    Cancer Paternal Grandfather        stomach    Allergies  Allergen Reactions   Keflex  [Cephalexin ] Diarrhea   Percocet [Oxycodone -Acetaminophen ] Other (See Comments)    Syncope (passed out)   Clindamycin /Lincomycin Rash    Medication list has been reviewed and updated.  Current Outpatient Medications on File Prior to Visit  Medication Sig Dispense Refill   acetaminophen  (TYLENOL ) 500 MG tablet 1000 mg by oral route.  cyclobenzaprine  (FLEXERIL ) 10 MG tablet Take 0.5-1 tablets (5-10 mg total) by mouth 2 (two) times daily as needed for muscle spasms. 30 tablet 2   diazepam  (VALIUM ) 2 MG tablet Take 1/2 or 1 by mouth prior to procedure as needed for anxiety 3 tablet 0   diclofenac Sodium (VOLTAREN) 1 % GEL Apply 2 g topically 4 (four) times daily as needed (pain).     gabapentin  (NEURONTIN ) 300 MG capsule TAKE 1 CAPSULE BY MOUTH 3 TIMES A DAY 90 capsule 3   hydrochlorothiazide  (HYDRODIURIL ) 12.5 MG tablet Take 1 tablet (12.5 mg total) by mouth daily as needed. 90 tablet 0   HYDROcodone -acetaminophen  (NORCO) 7.5-325 MG tablet Take 1 tablet by mouth every 8 (eight) hours as needed for moderate pain (pain score 4-6). 45 tablet 0   ketoconazole (NIZORAL) 2 % cream APPLY TO THE AFFECTED AREA EVERY DAY FOR 15 DAYS     metoprolol  succinate (TOPROL -XL) 25 MG 24 hr tablet Take 1 tablet (25 mg total) by mouth daily. TAKE WITH OR IMMEDIATELY FOLLOWING A MEAL. 90 tablet 1   NONFORMULARY OR COMPOUNDED ITEM [facility]:  Peripheral Neuropathy - Bupivacaine  1%, Doxepin 3%, Gabapentin  6%,  Pentoxifylline 3%, Topiramate 1%. Apply 1-2 grams to affected area 3-4 times daily. 100 each 11   omeprazole  (PRILOSEC) 20 MG capsule TAKE 1 CAPSULE BY MOUTH DAILY 90 capsule 1   pravastatin  (PRAVACHOL ) 20 MG tablet TAKE 1 TABLET BY MOUTH DAILY 90 tablet 1   pregabalin  (LYRICA ) 150 MG capsule      No current facility-administered medications on file prior to visit.    Review of Systems:  As per HPI- otherwise negative.    Physical Examination: Vitals:   02/16/24 1326  BP: 122/80  Pulse: 66  SpO2: 99%   Vitals:   02/16/24 1326  Weight: 158 lb 12.8 oz (72 kg)  Height: 5' 1 (1.549 m)   Body mass index is 30 kg/m. Ideal Body Weight: Weight in (lb) to have BMI = 25: 132  GEN: no acute distress.  Mild obesity, looks well HEENT: Atraumatic, Normocephalic.  Ears and Nose: No external deformity. CV: RRR, No M/G/R. No JVD. No thrill. No extra heart sounds. PULM: CTA B, no wheezes, crackles, rhonchi. No retractions. No resp. distress. No accessory muscle use. ABD: S, NT, ND, +BS. No rebound. No HSM. EXTR: No c/c/e PSYCH: Normally interactive. Conversant.  Ventral/ umbilical hernia nontender and easily reducible Right collarbone: Notes a rubbery mass, it has been present for years per her report    Assessment and Plan: Essential hypertension - Plan: Basic metabolic panel with GFR, CBC  Dyslipidemia - Plan: Lipid panel  Other chronic pain - Plan: DRUG MONITORING, PANEL 8 WITH CONFIRMATION, URINE  Osteoarthritis of multiple joints, unspecified osteoarthritis type  Bilateral hip pain  Low back pain with sciatica, sciatica laterality unspecified, unspecified back pain laterality, unspecified chronicity  Pre-diabetes - Plan: Hemoglobin A1c  Seen today for follow-up.  Blood pressure stable on current medication regimen She is using Pravachol , check lipids Chronic pain treated with gabapentin , as needed.  Check UDS today Signed Harlene Schroeder, MD  Received labs 9/18-  message to pt  Results for orders placed or performed in visit on 02/16/24  Basic metabolic panel with GFR   Collection Time: 02/16/24  2:01 PM  Result Value Ref Range   Sodium 142 135 - 145 mEq/L   Potassium 4.0 3.5 - 5.1 mEq/L   Chloride 103 96 - 112 mEq/L   CO2 30 19 -  32 mEq/L   Glucose, Bld 91 70 - 99 mg/dL   BUN 21 6 - 23 mg/dL   Creatinine, Ser 9.01 0.40 - 1.20 mg/dL   GFR 46.61 (L) >39.99 mL/min   Calcium  9.8 8.4 - 10.5 mg/dL  CBC   Collection Time: 02/16/24  2:01 PM  Result Value Ref Range   WBC 5.6 4.0 - 10.5 K/uL   RBC 4.21 3.87 - 5.11 Mil/uL   Platelets 227.0 150.0 - 400.0 K/uL   Hemoglobin 13.3 12.0 - 15.0 g/dL   HCT 59.5 63.9 - 53.9 %   MCV 95.9 78.0 - 100.0 fl   MCHC 32.9 30.0 - 36.0 g/dL   RDW 86.4 88.4 - 84.4 %  Hemoglobin A1c   Collection Time: 02/16/24  2:01 PM  Result Value Ref Range   Hgb A1c MFr Bld 6.2 4.6 - 6.5 %  Lipid panel   Collection Time: 02/16/24  2:01 PM  Result Value Ref Range   Cholesterol 164 0 - 200 mg/dL   Triglycerides 865.9 0.0 - 149.0 mg/dL   HDL 30.39 >60.99 mg/dL   VLDL 73.1 0.0 - 59.9 mg/dL   LDL Cholesterol 67 0 - 99 mg/dL   Total CHOL/HDL Ratio 2    NonHDL 94.27

## 2024-02-15 ENCOUNTER — Ambulatory Visit: Admitting: Physical Therapy

## 2024-02-15 DIAGNOSIS — R293 Abnormal posture: Secondary | ICD-10-CM | POA: Diagnosis not present

## 2024-02-15 DIAGNOSIS — R262 Difficulty in walking, not elsewhere classified: Secondary | ICD-10-CM

## 2024-02-15 DIAGNOSIS — M5442 Lumbago with sciatica, left side: Secondary | ICD-10-CM | POA: Diagnosis not present

## 2024-02-15 DIAGNOSIS — G8929 Other chronic pain: Secondary | ICD-10-CM

## 2024-02-15 DIAGNOSIS — M5441 Lumbago with sciatica, right side: Secondary | ICD-10-CM | POA: Diagnosis not present

## 2024-02-15 NOTE — Therapy (Signed)
 OUTPATIENT PHYSICAL THERAPY LOWER EXTREMITY .       Patient Name: Margaret [name] MRN: [id number] DOB:02-23-1941, 83 y.o., female Today's Date: 02/15/2024  END OF SESSION:  PT End of Session - 02/15/24 1544     Visit Number 23    Date for PT Re-Evaluation 03/30/24    PT Start Time 1530    PT Stop Time 1610    PT Time Calculation (min) 40 min            Past Medical History:  Diagnosis Date   Anxiety    Arthritis    Cancer (HCC)    lt. foot melanoma   Chronic kidney disease    lt. kidney cyst   Coronary artery disease    GERD (gastroesophageal reflux disease)    History of healed stress fracture 2013   bilateral feet   Hx of phlebitis    reports remote hx of superfical blood clots never anticoagulated   Hypertension    Osteopenia    Pneumonia    Past Surgical History:  Procedure Laterality Date   CATARACT EXTRACTION W/ INTRAOCULAR LENS IMPLANT Bilateral    CHOLECYSTECTOMY N/A 09/19/2016   Procedure: LAPAROSCOPIC CHOLECYSTECTOMY POSSIBLE INTRAOPERATIVE CHOLANGIOGRAM;  Surgeon: Donnice Bury, MD;  Location: [facility] OR;  Service: General;  Laterality: N/A;   ERCP N/A 09/18/2016   Procedure: ENDOSCOPIC RETROGRADE CHOLANGIOPANCREATOGRAPHY (ERCP);  Surgeon: Victory LITTIE Legrand DOUGLAS, MD;  Location: [facility] ENDOSCOPY;  Service: Endoscopy;  Laterality: N/A;   FOOT SURGERY     patient reports four foot surgeries   FOOT SURGERY Left    bunion, hammer toe surgery   FOOT SURGERY Right    shaved bunion, hammer toe correction   HERNIA REPAIR     KNEE ARTHROSCOPY Right 06/02/1995   REPLACEMENT TOTAL KNEE Left    REPLACEMENT TOTAL KNEE Left 10/30/2012   RETINAL DETACHMENT SURGERY     RETINAL DETACHMENT SURGERY Left 06/01/2006   ROBOTIC ASSITED PARTIAL NEPHRECTOMY Left 06/03/2022   Procedure: XI ROBOTIC ASSITED PARTIAL NEPHRECTOMY WITH ULTRASOUND AND VENTRAL HERNIA REPAIR;  Surgeon: Alvaro Hummer, MD;  Location: [facility] ORS;  Service: Urology;  Laterality: Left;  3 HRS   THYROIDECTOMY,  PARTIAL Right    THYROIDECTOMY, PARTIAL  06/02/2003   TOTAL KNEE ARTHROPLASTY Right 11/13/2019   Procedure: TOTAL KNEE ARTHROPLASTY SDDC;  Surgeon: Melodi Lerner, MD;  Location: [facility] ORS;  Service: Orthopedics;  Laterality: Right;    Patient Active Problem List   Diagnosis Date Noted   Renal mass 06/03/2022   Open wound of left side of back 01/20/2021   [place] 11/18/2020   Lipoma of back 11/18/2020   Lymphedema 11/18/2020   Melanocytic nevi of right upper limb, including shoulder 11/18/2020   Personal history of malignant melanoma of skin 11/18/2020   Sebaceous cyst 11/18/2020   Seborrheic dermatitis 11/18/2020   Pre-diabetes 04/03/2020   OA (osteoarthritis) of knee 11/13/2019   Total knee replacement status, 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   Pain in right knee 08/03/2019   Fluid collection at surgical site 10/20/2018   Encounter for postoperative examination after surgery for malignant neoplasm 09/30/2018   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/2018   Plantar fasciitis of right foot 02/03/2017   Porokeratosis 02/03/2017   Acute gallstone pancreatitis    Calculus of gallbladder with acute cholecystitis and obstruction    LFT elevation    Stress fracture 01/01/2016   Insomnia 12/16/2015   Peripheral neuropathy 10/18/2015   Renal insufficiency 03/06/2015   HTN (hypertension) 03/06/2015  Osteopenia 03/06/2015   GERD (gastroesophageal reflux disease) 03/06/2015   Vaginal pessary present 03/06/2015   Metatarsalgia 07/13/2012   Increased frequency of urination 04/13/2012   DDD (degenerative disc disease), cervical 01/19/2012   Knee stiffness 01/19/2012   Low back pain 01/19/2012   Neck pain 01/19/2012   Pain of hand 01/19/2012   Hallux valgus 09/20/2011   Hyperkeratosis 08/13/2011   Anxiety disorder 07/06/2011   Depression 07/06/2011   Diverticulosis of large intestine 07/06/2011   Non-toxic multinodular goiter 07/06/2011   Atrophic vaginitis 10/24/2010   Dermatographic  urticaria 09/17/2010   Inflamed seborrheic keratosis 09/17/2010   Closed fracture of fifth metatarsal bone 07/10/2010   Diagnosis unknown 02/27/2010   Hip pain 06/08/2008   Pulmonary nodule seen on imaging study 06/08/2008    PCP: Watt Raisin, MD  REFERRING PROVIDER: Charlie Dolores, MD, [facility]  REFERRING DIAG: degeneration of discs of lumbar spine  THERAPY DIAG:  Chronic bilateral low back pain with right-sided sciatica  Difficulty in walking, not elsewhere classified  Posture imbalance  Rationale for Evaluation and Treatment: Rehabilitation  ONSET DATE: chronic  SUBJECTIVE:   SUBJECTIVE STATEMENT     tired this afternoon, a lot entertaining son   PERTINENT HISTORY: Saw orthopedist, took prednisone, which didn't help, orthopedist may want to do MRI, referred to PT by both PCP and orthopedist, also B degenerative arthritis hips PAIN:  Are you having pain? 4/10 back and thighs  PRECAUTIONS: Fall  RED FLAGS: None   WEIGHT BEARING RESTRICTIONS: No  FALLS:  Has patient fallen in last 6 months? Yes. Number of falls several  LIVING ENVIRONMENT: Lives with: lives with their family and lives with their daughter Lives in: House/apartment Stairs: Yes: Internal: one flight steps; on left going up Has following equipment at home: Environmental consultant - 4 wheeled  OCCUPATION: retired  PLOF: Independent and Independent with basic ADLs  PATIENT GOALS: reduce pain hips and lower back so I can tolerate more activity  NEXT MD VISIT: not scheduled yet  OBJECTIVE:  Note: Objective measures were completed at Evaluation unless otherwise noted.  DIAGNOSTIC FINDINGS: X-ray done 11/23; IMPRESSION: 1. 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 2. Lower lumbar spondylosis.  PATIENT SURVEYS:  Modified Oswestry Modified Oswestry Low Back Pain Disability Questionnaire: 29 / 50 = 58.0 %   COGNITION: Overall cognitive status: Within functional limits  for tasks assessed     SENSATION: Light touch: WFL  EDEMA:  None noted   MUSCLE LENGTH: Hamstrings: Right wfl deg; Left wfl deg Debby test: Right wfl deg; Left wfl deg  POSTURE: scoliotic posture noted concavity L lumbar, pronounced thoracic kyphosis  PALPATION: Not tender with light palpation B gr trochanters hips  LOWER EXTREMITY ROM:B hip ROM wnl, able to achieve tailor position each leg in sitting  LUMBAR ROM: FB limited 15%, also risk of falling pt needed UE support Lumbar ext, 25% with marked side bending to L with this motion SB B 35% B with central lower back pain LOWER EXTREMITY MMT:  MMT Right eval Left eval  Hip flexion    Hip extension    Hip abduction    Hip adduction    Hip internal rotation    Hip external rotation    Knee flexion    Knee extension    Ankle dorsiflexion    Ankle plantarflexion    Ankle inversion    Ankle eversion     (Blank rows = not tested)   FUNCTIONAL TESTS:  5 times sit to stand: 25 sec, required mod assist of PT to avoid falling backwards into chair with final attempt  GAIT: Distance walked: in clinic up to 60  Assistive device utilized: Single point cane, Walker - 2 wheeled, and none Level of assistance: Min A without device and with cane Comments: very unstable without device                                                                                                                                 TREATMENT DATE:   02/15/24 Nustep L 6 10 min STS with wt ball chest press 10 x Resisted trunk flex and ext 20 x Seated row 20# 2 sets 10 Knee ext 5# 2 sets 10 HS curl 20# 2 sets 10 Leg press 20# 3 sets 10   02/10/24 Nustep L 6 10 min STS wt ball chest press 10 x Knee ext 5# 2 sets 10 HS curl 20# 2 sets 10 Leg press 20# 3 sets 10 Tband standing shld ext and row 2 sets 10 Tband seated trunk flex and ext  02/08/24 Amb outside 300 feet with curb sup-CGA without AD with cuing to increase stride as she was taking  short shuffle steps esp as she fatigued Nustep L 6 10 min STS wt ball CGA 10 x Light HHA vectro taps Light HHA stepping fwd and back over 2 in pole 3# HHA marching fwd and back 20 feet 2 x 3# HHA side stepping 20 feet 2 x  02/03/24 Nustep L 5 10 min BERG   36/56 Ball toss on airex CGA with 2 LOB STS from elevated mat 2 sets 5 on airex min A Obstacle course stepping on over and around various objects 6 in step taps 10 x each leg then alt CG-min A      02/01/24 Nustep L 5 10 min Amb HHA 5 min outside working on upright posture and cadence Trunk flex and ext 20 x blue tband STS wt ball 10x  HHA on airex stepping on and off fwd,SW and back 10 x each HS curl 25# 2 sets 10 Knee ext 5# 2 sets 10     01/25/24 Nustep L 5 STS wt ball CGA 10 x, cued to not use legs to brace on mat Standing on airex CGA shld ext and row 10 x Standing on airex HHA alt LE 20 x marching,SL hip flex,ext and abd Standing HHA marching fwd and back 20 feet 2 x each and side stepping 20 feet 2 x each Seated resisted trunk flex and ext 20 x Amb 500 feet HHA working on upright trunk, and cadence and she did very well- 2 brief standing rest breaks   01/18/24 Nustep L 5 Goals assessed and documented Knee ext 5# 2 set s10 HS curl 15# 2 sets 10 Resisted seated trunk ext 2 sets 10 Seated row 20# 10 x  15# 10 x 3# ankle  wts HHA marcing fwd and back ward 15 feet 3 x each, side stepping 15 feet 3 x each 3# ankle wt HHA 10 x each SL hip flex,ext and abd  12/30/23 Nustep L 5 Goals assessed and documented STS 5x 16.54 sec Foam beam in // bars 3x side stepping and then tandem fwd and back CGA with light HHA STS with ball toss 10 x Ball toss on airex, only 1 LOB 2 sets 10 Knee ext 5# 2 set s10 HS curl 15# 2 sets 10 [employer] 5# 1 lap each hand Resisted seated trunk ext 2 sets 10 Seated row 15# 2 sets 10     12/13/23 Nustep L 5 8 min  Knee ext 5# 2 set s10 HS curl 15# 2 sets 10 STS with  4# chest press 10x ADD ball squeeze 2x15 Hip abd w/annual resistance 2x15 Alt 4in box taps CGA 2x10  12/10/23 STS with 4# chest press 10x Assessed and documented goals Nustep L 5 Resisted gait 20# 3 x 4 ways- min A Knee ext 5# 2 set s10 HS curl 15# 2 sets 10  12/07/23 Resisted trunk flex and ext 20 x Seated resisted row and shld ext 2 sets 10 Nustep L 5 8 min Green tband HS curl 2 sets 10 3# LAQ 2 sets 10 3# HHA SL hip flex,ext and abd 10 x each- decreased RT LE mvmt ADD ball squeeze 15 x    12/02/23 Nustep L 5 BM for vacuuming to decrease strain on back Green tband HS curl,hip flex and clams seated 2 sets 10 ADD ball squeeze 2 sets 10 3# LAQ 2 sets 10 Red tband seated row and shld ext 2 sets 10 Tband trunk ext 2 sets 10 Standing with RW 3# hip ext and 10 x each      09/20/23:   Evaluation, explained to pt that long term management of arthritic changes in hips in spine, should respond well to moderate exercise. Practiced gait with st cane, pt without any marked improvement in her stability with the use of the cane.  Therefore practiced with front wheeled walker, marked improvement in gait pattern . Recommended that she use a front wheeled walker which is more light weight and easier to maneuver in and out of car for outings, she is a high fall risk.  PATIENT EDUCATION:  Education details: POC, goals Person educated: Patient Education method: Explanation, Demonstration, Tactile cues, and Verbal cues Education comprehension: verbalized understanding and returned demonstration  HOME EXERCISE PROGRAM: Green t band hip abd/ER in sitting and green t band ankle pumps L .   ASSESSMENT:  CLINICAL IMPRESSION:    pt arrives stating she was tired. We focused more on strength than balance today as she felt that would be better as she was very fatigued. Pt tolerated well.  OBJECTIVE IMPAIRMENTS: decreased activity tolerance, decreased balance, decreased knowledge of  condition, decreased knowledge of use of DME, decreased mobility, difficulty walking, decreased ROM, decreased strength, decreased safety awareness, impaired perceived functional ability, postural dysfunction, and pain.   ACTIVITY LIMITATIONS: carrying, lifting, bending, standing, squatting, transfers, bed mobility, and locomotion level  PARTICIPATION LIMITATIONS: meal prep, cleaning, laundry, shopping, community activity, and yard work  PERSONAL FACTORS: Age, Behavior pattern, Fitness, Past/current experiences, Time since onset of injury/illness/exacerbation, and 1-2 comorbidities: severe DJD B hips, lumbar region, neuropathy are also affecting patient's functional outcome.   REHAB POTENTIAL: Good  CLINICAL DECISION MAKING: Evolving/moderate complexity  EVALUATION COMPLEXITY: Moderate   GOALS:  Goals reviewed with patient? Yes  SHORT TERM GOALS: Target date: 2 weeks 5 5/25 I HEP Baseline: Goal status: MET 10/26/23   LONG TERM GOALS: Target date: 12 weeks, 02/10/24  Modified Oswestry Low Back Pain Disability Questionnaire: 29 / 50 = 58.0 %  Baseline:  Goal status: 11/11/23 progressing  and 11/18/23  11/30/23 progressing 12/10/23 progressing   12/30/23 progressing   01/18/24 progressing 02/10/24 MET  2.  Safe gait with appropriate device , over 350' for safe household distances, without balance loss Baseline:  Goal status: on going 10/26/23  progressing 11/04/23  and 11/11/23  Progressing 11/18/23  progressing 11/30/23 distance met but unsteady  12/10/23  amb without AD 250 feet, no LOB  but instability with lateral sway 12/30/23 progressing 250 feet then slows down and balance decreases 01/18/24 progressing  Progressing and showing good improvements 02/01/24 02/10/24 progressing  3.  5 x sit to stand 14.8 sec or less without balance loss Baseline: 25 sec,with UE support B  required mod assist on final rep to avoid fall  Goal status: progressing 10/26/23  progressing 11/05/23  11/18/23 multi tries and  posterior lean,progressing  11/30/23 LOB progressing  12/10/23 20 sec 12/30/23 progressing   01/18/24 18.5 sec with 1 x bracing knees 02/01/24 progressing uses knees to brace with 02/10/24 16 sec progressing  4. Will increase BERG score to 45 to demonstrate improved balance   Baseline: 36  Goal status: initial     PLAN:  PT FREQUENCY: 2x/week  PT DURATION: 12 weeks  PLANNED INTERVENTIONS: 97110-Therapeutic exercises, 97530- Therapeutic activity, 97112- Neuromuscular re-education, 97535- Self Care, [id number]- Manual therapy, G0283- Electrical stimulation (unattended), and 97033- Ionotophoresis 4mg /ml Dexamethasone   PLAN FOR NEXT SESSION:  progress func balance and strength    Fathima Bartl,ANGIE, PTA,  02/15/2024, 3:45 PM [facility] [address] [facility] [place], [place], [address] Phone: [phone]   Fax:  [phone]

## 2024-02-16 ENCOUNTER — Ambulatory Visit (INDEPENDENT_AMBULATORY_CARE_PROVIDER_SITE_OTHER): Admitting: Family Medicine

## 2024-02-16 ENCOUNTER — Encounter: Payer: Self-pay | Admitting: Family Medicine

## 2024-02-16 VITALS — BP 122/80 | HR 66 | Ht 61.0 in | Wt 158.8 lb

## 2024-02-16 DIAGNOSIS — M5440 Lumbago with sciatica, unspecified side: Secondary | ICD-10-CM | POA: Diagnosis not present

## 2024-02-16 DIAGNOSIS — I10 Essential (primary) hypertension: Secondary | ICD-10-CM

## 2024-02-16 DIAGNOSIS — Z23 Encounter for immunization: Secondary | ICD-10-CM | POA: Diagnosis not present

## 2024-02-16 DIAGNOSIS — M25551 Pain in right hip: Secondary | ICD-10-CM

## 2024-02-16 DIAGNOSIS — R7303 Prediabetes: Secondary | ICD-10-CM | POA: Diagnosis not present

## 2024-02-16 DIAGNOSIS — E785 Hyperlipidemia, unspecified: Secondary | ICD-10-CM | POA: Diagnosis not present

## 2024-02-16 DIAGNOSIS — G8929 Other chronic pain: Secondary | ICD-10-CM | POA: Diagnosis not present

## 2024-02-16 DIAGNOSIS — M159 Polyosteoarthritis, unspecified: Secondary | ICD-10-CM

## 2024-02-16 DIAGNOSIS — M25552 Pain in left hip: Secondary | ICD-10-CM | POA: Diagnosis not present

## 2024-02-16 NOTE — Patient Instructions (Addendum)
 I agree with just observing your hernia and the lipoma on your shoulder for now If you develop any severe pain from your hernia however seek care right away!   Flu shot today Recommend a covid booster this fall as well as one dose of RSV if not done already I will be in touch with your labs Please see me in 6 months

## 2024-02-17 ENCOUNTER — Encounter: Payer: Self-pay | Admitting: Family Medicine

## 2024-02-17 ENCOUNTER — Ambulatory Visit: Admitting: Physical Therapy

## 2024-02-17 DIAGNOSIS — R262 Difficulty in walking, not elsewhere classified: Secondary | ICD-10-CM

## 2024-02-17 DIAGNOSIS — M5442 Lumbago with sciatica, left side: Secondary | ICD-10-CM | POA: Diagnosis not present

## 2024-02-17 DIAGNOSIS — R293 Abnormal posture: Secondary | ICD-10-CM | POA: Diagnosis not present

## 2024-02-17 DIAGNOSIS — M5441 Lumbago with sciatica, right side: Secondary | ICD-10-CM | POA: Diagnosis not present

## 2024-02-17 DIAGNOSIS — G8929 Other chronic pain: Secondary | ICD-10-CM | POA: Diagnosis not present

## 2024-02-17 LAB — BASIC METABOLIC PANEL WITH GFR
BUN: 21 mg/dL (ref 6–23)
CO2: 30 meq/L (ref 19–32)
Calcium: 9.8 mg/dL (ref 8.4–10.5)
Chloride: 103 meq/L (ref 96–112)
Creatinine, Ser: 0.98 mg/dL (ref 0.40–1.20)
GFR: 53.38 mL/min — ABNORMAL LOW (ref >60.00)
Glucose, Bld: 91 mg/dL (ref 70–99)
Potassium: 4 meq/L (ref 3.5–5.1)
Sodium: 142 meq/L (ref 135–145)

## 2024-02-17 LAB — HEMOGLOBIN A1C: Hgb A1c MFr Bld: 6.2 % (ref 4.6–6.5)

## 2024-02-17 LAB — LIPID PANEL
Cholesterol: 164 mg/dL (ref 0–200)
HDL: 69.6 mg/dL (ref >39.00)
LDL Cholesterol: 67 mg/dL (ref 0–99)
NonHDL: 94.27
Total CHOL/HDL Ratio: 2
Triglycerides: 134 mg/dL (ref 0.0–149.0)
VLDL: 26.8 mg/dL (ref 0.0–40.0)

## 2024-02-17 LAB — CBC
HCT: 40.4 % (ref 36.0–46.0)
Hemoglobin: 13.3 g/dL (ref 12.0–15.0)
MCHC: 32.9 g/dL (ref 30.0–36.0)
MCV: 95.9 fl (ref 78.0–100.0)
Platelets: 227 K/uL (ref 150.0–400.0)
RBC: 4.21 Mil/uL (ref 3.87–5.11)
RDW: 13.5 % (ref 11.5–15.5)
WBC: 5.6 K/uL (ref 4.0–10.5)

## 2024-02-17 NOTE — Therapy (Signed)
 OUTPATIENT PHYSICAL THERAPY LOWER EXTREMITY .       Patient Name: Margaret [name] MRN: [id number] DOB:01-May-1941, 83 y.o., female Today's Date: 02/17/2024  END OF SESSION:  PT End of Session - 02/17/24 1456     Visit Number 24    Date for Recertification  03/30/24    PT Start Time 1445    PT Stop Time 1530    PT Time Calculation (min) 45 min            Past Medical History:  Diagnosis Date   Anxiety    Arthritis    Cancer (HCC)    lt. foot melanoma   Chronic kidney disease    lt. kidney cyst   Coronary artery disease    GERD (gastroesophageal reflux disease)    History of healed stress fracture 2013   bilateral feet   Hx of phlebitis    reports remote hx of superfical blood clots never anticoagulated   Hypertension    Osteopenia    Pneumonia    Past Surgical History:  Procedure Laterality Date   CATARACT EXTRACTION W/ INTRAOCULAR LENS IMPLANT Bilateral    CHOLECYSTECTOMY N/A 09/19/2016   Procedure: LAPAROSCOPIC CHOLECYSTECTOMY POSSIBLE INTRAOPERATIVE CHOLANGIOGRAM;  Surgeon: Donnice Bury, MD;  Location: [facility] OR;  Service: General;  Laterality: N/A;   ERCP N/A 09/18/2016   Procedure: ENDOSCOPIC RETROGRADE CHOLANGIOPANCREATOGRAPHY (ERCP);  Surgeon: Victory LITTIE Legrand DOUGLAS, MD;  Location: [facility] ENDOSCOPY;  Service: Endoscopy;  Laterality: N/A;   FOOT SURGERY     patient reports four foot surgeries   FOOT SURGERY Left    bunion, hammer toe surgery   FOOT SURGERY Right    shaved bunion, hammer toe correction   HERNIA REPAIR     KNEE ARTHROSCOPY Right 06/02/1995   REPLACEMENT TOTAL KNEE Left    REPLACEMENT TOTAL KNEE Left 10/30/2012   RETINAL DETACHMENT SURGERY     RETINAL DETACHMENT SURGERY Left 06/01/2006   ROBOTIC ASSITED PARTIAL NEPHRECTOMY Left 06/03/2022   Procedure: XI ROBOTIC ASSITED PARTIAL NEPHRECTOMY WITH ULTRASOUND AND VENTRAL HERNIA REPAIR;  Surgeon: Alvaro Hummer, MD;  Location: [facility] ORS;  Service: Urology;  Laterality: Left;  3 HRS   THYROIDECTOMY,  PARTIAL Right    THYROIDECTOMY, PARTIAL  06/02/2003   TOTAL KNEE ARTHROPLASTY Right 11/13/2019   Procedure: TOTAL KNEE ARTHROPLASTY SDDC;  Surgeon: Melodi Lerner, MD;  Location: [facility] ORS;  Service: Orthopedics;  Laterality: Right;    Patient Active Problem List   Diagnosis Date Noted   Renal mass 06/03/2022   Open wound of left side of back 01/20/2021   [place] 11/18/2020   Lipoma of back 11/18/2020   Lymphedema 11/18/2020   Melanocytic nevi of right upper limb, including shoulder 11/18/2020   Personal history of malignant melanoma of skin 11/18/2020   Sebaceous cyst 11/18/2020   Seborrheic dermatitis 11/18/2020   Pre-diabetes 04/03/2020   OA (osteoarthritis) of knee 11/13/2019   Total knee replacement status, 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   Pain in right knee 08/03/2019   Fluid collection at surgical site 10/20/2018   Encounter for postoperative examination after surgery for malignant neoplasm 09/30/2018   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/2018   Plantar fasciitis of right foot 02/03/2017   Porokeratosis 02/03/2017   Acute gallstone pancreatitis    Calculus of gallbladder with acute cholecystitis and obstruction    LFT elevation    Stress fracture 01/01/2016   Insomnia 12/16/2015   Peripheral neuropathy 10/18/2015   Renal insufficiency 03/06/2015   HTN (hypertension) 03/06/2015  Osteopenia 03/06/2015   GERD (gastroesophageal reflux disease) 03/06/2015   Vaginal pessary present 03/06/2015   Metatarsalgia 07/13/2012   Increased frequency of urination 04/13/2012   DDD (degenerative disc disease), cervical 01/19/2012   Knee stiffness 01/19/2012   Low back pain 01/19/2012   Neck pain 01/19/2012   Pain of hand 01/19/2012   Hallux valgus 09/20/2011   Hyperkeratosis 08/13/2011   Anxiety disorder 07/06/2011   Depression 07/06/2011   Diverticulosis of large intestine 07/06/2011   Non-toxic multinodular goiter 07/06/2011   Atrophic vaginitis 10/24/2010   Dermatographic  urticaria 09/17/2010   Inflamed seborrheic keratosis 09/17/2010   Closed fracture of fifth metatarsal bone 07/10/2010   Diagnosis unknown 02/27/2010   Hip pain 06/08/2008   Pulmonary nodule seen on imaging study 06/08/2008    PCP: Watt Raisin, MD  REFERRING PROVIDER: Charlie Dolores, MD, [facility]  REFERRING DIAG: degeneration of discs of lumbar spine  THERAPY DIAG:  Chronic bilateral low back pain with right-sided sciatica  Difficulty in walking, not elsewhere classified  Posture imbalance  Chronic bilateral low back pain with left-sided sciatica  Rationale for Evaluation and Treatment: Rehabilitation  ONSET DATE: chronic  SUBJECTIVE:   SUBJECTIVE STATEMENT     doing pretty well, saw PCP yesterday and got a good report   PERTINENT HISTORY: Saw orthopedist, took prednisone, which didn't help, orthopedist may want to do MRI, referred to PT by both PCP and orthopedist, also B degenerative arthritis hips PAIN:  Are you having pain? 4/10 back and thighs  PRECAUTIONS: Fall  RED FLAGS: None   WEIGHT BEARING RESTRICTIONS: No  FALLS:  Has patient fallen in last 6 months? Yes. Number of falls several  LIVING ENVIRONMENT: Lives with: lives with their family and lives with their daughter Lives in: House/apartment Stairs: Yes: Internal: one flight steps; on left going up Has following equipment at home: Environmental consultant - 4 wheeled  OCCUPATION: retired  PLOF: Independent and Independent with basic ADLs  PATIENT GOALS: reduce pain hips and lower back so I can tolerate more activity  NEXT MD VISIT: not scheduled yet  OBJECTIVE:  Note: Objective measures were completed at Evaluation unless otherwise noted.  DIAGNOSTIC FINDINGS: X-ray done 11/23; IMPRESSION: 1. 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 2. Lower lumbar spondylosis.  PATIENT SURVEYS:  Modified Oswestry Modified Oswestry Low Back Pain Disability Questionnaire: 29 / 50 =  58.0 %   COGNITION: Overall cognitive status: Within functional limits for tasks assessed     SENSATION: Light touch: WFL  EDEMA:  None noted   MUSCLE LENGTH: Hamstrings: Right wfl deg; Left wfl deg Debby test: Right wfl deg; Left wfl deg  POSTURE: scoliotic posture noted concavity L lumbar, pronounced thoracic kyphosis  PALPATION: Not tender with light palpation B gr trochanters hips  LOWER EXTREMITY ROM:B hip ROM wnl, able to achieve tailor position each leg in sitting  LUMBAR ROM: FB limited 15%, also risk of falling pt needed UE support Lumbar ext, 25% with marked side bending to L with this motion SB B 35% B with central lower back pain LOWER EXTREMITY MMT:  MMT Right eval Left eval  Hip flexion    Hip extension    Hip abduction    Hip adduction    Hip internal rotation    Hip external rotation    Knee flexion    Knee extension    Ankle dorsiflexion    Ankle plantarflexion    Ankle inversion    Ankle eversion     (  Blank rows = not tested)   FUNCTIONAL TESTS:  5 times sit to stand: 25 sec, required mod assist of PT to avoid falling backwards into chair with final attempt  GAIT: Distance walked: in clinic up to 60  Assistive device utilized: Single point cane, Walker - 2 wheeled, and none Level of assistance: Min A without device and with cane Comments: very unstable without device                                                                                                                                 TREATMENT DATE:   02/17/24 Nustep L 6 10 min In // bars foam beam fwd and back tandem 3 x then side stepping 3 x , light HHA STS from standard chair 2 sets 5 cg-min A 6 inch alt step tap 2 sets 10- LOB 2 not clearing foot  20# resisted gait fwd and back 5 x , then 3 x each side CG-min A with 2 LOB Aura carry 6#  02/15/24 Nustep L 6 10 min STS with wt ball chest press 10 x Resisted trunk flex and ext 20 x Seated row 20# 2 sets 10 Knee ext  5# 2 sets 10 HS curl 20# 2 sets 10 Leg press 20# 3 sets 10   02/10/24 Nustep L 6 10 min STS wt ball chest press 10 x Knee ext 5# 2 sets 10 HS curl 20# 2 sets 10 Leg press 20# 3 sets 10 Tband standing shld ext and row 2 sets 10 Tband seated trunk flex and ext  02/08/24 Amb outside 300 feet with curb sup-CGA without AD with cuing to increase stride as she was taking short shuffle steps esp as she fatigued Nustep L 6 10 min STS wt ball CGA 10 x Light HHA vectro taps Light HHA stepping fwd and back over 2 in pole 3# HHA marching fwd and back 20 feet 2 x 3# HHA side stepping 20 feet 2 x  02/03/24 Nustep L 5 10 min BERG   36/56 Ball toss on airex CGA with 2 LOB STS from elevated mat 2 sets 5 on airex min A Obstacle course stepping on over and around various objects 6 in step taps 10 x each leg then alt CG-min A      02/01/24 Nustep L 5 10 min Amb HHA 5 min outside working on upright posture and cadence Trunk flex and ext 20 x blue tband STS wt ball 10x  HHA on airex stepping on and off fwd,SW and back 10 x each HS curl 25# 2 sets 10 Knee ext 5# 2 sets 10     01/25/24 Nustep L 5 STS wt ball CGA 10 x, cued to not use legs to brace on mat Standing on airex CGA shld ext and row 10 x Standing on airex HHA alt LE 20 x marching,SL hip flex,ext and abd Standing HHA marching fwd and back 20  feet 2 x each and side stepping 20 feet 2 x each Seated resisted trunk flex and ext 20 x Amb 500 feet HHA working on upright trunk, and cadence and she did very well- 2 brief standing rest breaks   01/18/24 Nustep L 5 Goals assessed and documented Knee ext 5# 2 set s10 HS curl 15# 2 sets 10 Resisted seated trunk ext 2 sets 10 Seated row 20# 10 x  15# 10 x 3# ankle wts HHA marcing fwd and back ward 15 feet 3 x each, side stepping 15 feet 3 x each 3# ankle wt HHA 10 x each SL hip flex,ext and abd  12/30/23 Nustep L 5 Goals assessed and documented STS 5x 16.54 sec Foam  beam in // bars 3x side stepping and then tandem fwd and back CGA with light HHA STS with ball toss 10 x Ball toss on airex, only 1 LOB 2 sets 10 Knee ext 5# 2 set s10 HS curl 15# 2 sets 10 [employer] 5# 1 lap each hand Resisted seated trunk ext 2 sets 10 Seated row 15# 2 sets 10     12/13/23 Nustep L 5 8 min  Knee ext 5# 2 set s10 HS curl 15# 2 sets 10 STS with 4# chest press 10x ADD ball squeeze 2x15 Hip abd w/annual resistance 2x15 Alt 4in box taps CGA 2x10  12/10/23 STS with 4# chest press 10x Assessed and documented goals Nustep L 5 Resisted gait 20# 3 x 4 ways- min A Knee ext 5# 2 set s10 HS curl 15# 2 sets 10  12/07/23 Resisted trunk flex and ext 20 x Seated resisted row and shld ext 2 sets 10 Nustep L 5 8 min Green tband HS curl 2 sets 10 3# LAQ 2 sets 10 3# HHA SL hip flex,ext and abd 10 x each- decreased RT LE mvmt ADD ball squeeze 15 x    12/02/23 Nustep L 5 BM for vacuuming to decrease strain on back Green tband HS curl,hip flex and clams seated 2 sets 10 ADD ball squeeze 2 sets 10 3# LAQ 2 sets 10 Red tband seated row and shld ext 2 sets 10 Tband trunk ext 2 sets 10 Standing with RW 3# hip ext and 10 x each      09/20/23:   Evaluation, explained to pt that long term management of arthritic changes in hips in spine, should respond well to moderate exercise. Practiced gait with st cane, pt without any marked improvement in her stability with the use of the cane.  Therefore practiced with front wheeled walker, marked improvement in gait pattern . Recommended that she use a front wheeled walker which is more light weight and easier to maneuver in and out of car for outings, she is a high fall risk.  PATIENT EDUCATION:  Education details: POC, goals Person educated: Patient Education method: Explanation, Demonstration, Tactile cues, and Verbal cues Education comprehension: verbalized understanding and returned demonstration  HOME EXERCISE  PROGRAM: Green t band hip abd/ER in sitting and green t band ankle pumps L .   ASSESSMENT:  CLINICAL IMPRESSION:    goals assessed . Focus session on balance and she did well but multi LOB as documents above and STS is still very difficult without UE from standard height OBJECTIVE IMPAIRMENTS: decreased activity tolerance, decreased balance, decreased knowledge of condition, decreased knowledge of use of DME, decreased mobility, difficulty walking, decreased ROM, decreased strength, decreased safety awareness, impaired perceived functional ability, postural  dysfunction, and pain.   ACTIVITY LIMITATIONS: carrying, lifting, bending, standing, squatting, transfers, bed mobility, and locomotion level  PARTICIPATION LIMITATIONS: meal prep, cleaning, laundry, shopping, community activity, and yard work  PERSONAL FACTORS: Age, Behavior pattern, Fitness, Past/current experiences, Time since onset of injury/illness/exacerbation, and 1-2 comorbidities: severe DJD B hips, lumbar region, neuropathy are also affecting patient's functional outcome.   REHAB POTENTIAL: Good  CLINICAL DECISION MAKING: Evolving/moderate complexity  EVALUATION COMPLEXITY: Moderate   GOALS: Goals reviewed with patient? Yes  SHORT TERM GOALS: Target date: 2 weeks 5 5/25 I HEP Baseline: Goal status: MET 10/26/23   LONG TERM GOALS: Target date: 12 weeks, 02/10/24  Modified Oswestry Low Back Pain Disability Questionnaire: 29 / 50 = 58.0 %  Baseline:  Goal status: 11/11/23 progressing  and 11/18/23  11/30/23 progressing 12/10/23 progressing   12/30/23 progressing   01/18/24 progressing 02/10/24 MET  2.  Safe gait with appropriate device , over 350' for safe household distances, without balance loss Baseline:  Goal status: on going 10/26/23  progressing 11/04/23  and 11/11/23  Progressing 11/18/23  progressing 11/30/23 distance met but unsteady  12/10/23  amb without AD 250 feet, no LOB  but instability with lateral sway 12/30/23  progressing 250 feet then slows down and balance decreases 01/18/24 progressing  Progressing and showing good improvements 02/01/24 02/10/24 progressing and 02/17/24  3.  5 x sit to stand 14.8 sec or less without balance loss Baseline: 25 sec,with UE support B  required mod assist on final rep to avoid fall  Goal status: progressing 10/26/23  progressing 11/05/23  11/18/23 multi tries and posterior lean,progressing  11/30/23 LOB progressing  12/10/23 20 sec 12/30/23 progressing   01/18/24 18.5 sec with 1 x bracing knees 02/01/24 progressing uses knees to brace with 02/10/24 16 sec progressing  4. Will increase BERG score to 45 to demonstrate improved balance   Baseline: 36  Goal status: progressing 02/17/24    PLAN:  PT FREQUENCY: 2x/week  PT DURATION: 12 weeks  PLANNED INTERVENTIONS: 97110-Therapeutic exercises, 97530- Therapeutic activity, 97112- Neuromuscular re-education, 97535- Self Care, [id number]- Manual therapy, G0283- Electrical stimulation (unattended), and 97033- Ionotophoresis 4mg /ml Dexamethasone   PLAN FOR NEXT SESSION:  progress func balance and strength    Arihaan Bellucci,ANGIE, PTA,  02/17/2024, 2:57 PM [facility] at [facility] [address] [facility] [place], [place], [address] Phone: [phone]   Fax:  [phone]

## 2024-02-18 LAB — DRUG MONITORING, PANEL 8 WITH CONFIRMATION, URINE
6 Acetylmorphine: NEGATIVE ng/mL (ref <10)
Alcohol Metabolites: NEGATIVE ng/mL (ref <500)
Amphetamines: NEGATIVE ng/mL (ref <500)
Benzodiazepines: NEGATIVE ng/mL (ref <100)
Buprenorphine, Urine: NEGATIVE ng/mL (ref <5)
Cocaine Metabolite: NEGATIVE ng/mL (ref <150)
Codeine: NEGATIVE ng/mL (ref <50)
Creatinine: 52.2 mg/dL (ref >=20.0)
Hydrocodone: 682 ng/mL — ABNORMAL HIGH (ref <50)
Hydromorphone: 202 ng/mL — ABNORMAL HIGH (ref <50)
MDMA: NEGATIVE ng/mL (ref <500)
Marijuana Metabolite: NEGATIVE ng/mL (ref <20)
Morphine: NEGATIVE ng/mL (ref <50)
Norhydrocodone: 809 ng/mL — ABNORMAL HIGH (ref <50)
Opiates: POSITIVE ng/mL — AB (ref <100)
Oxidant: NEGATIVE ug/mL (ref <200)
Oxycodone: NEGATIVE ng/mL (ref <100)
pH: 5.7 (ref 4.5–9.0)

## 2024-02-18 LAB — DM TEMPLATE

## 2024-02-22 ENCOUNTER — Other Ambulatory Visit: Payer: Self-pay

## 2024-02-22 ENCOUNTER — Ambulatory Visit: Admitting: Physical Therapy

## 2024-02-22 ENCOUNTER — Encounter: Payer: Self-pay | Admitting: Podiatry

## 2024-02-22 DIAGNOSIS — R262 Difficulty in walking, not elsewhere classified: Secondary | ICD-10-CM | POA: Diagnosis not present

## 2024-02-22 DIAGNOSIS — M5441 Lumbago with sciatica, right side: Secondary | ICD-10-CM | POA: Diagnosis not present

## 2024-02-22 DIAGNOSIS — M5442 Lumbago with sciatica, left side: Secondary | ICD-10-CM | POA: Diagnosis not present

## 2024-02-22 DIAGNOSIS — R293 Abnormal posture: Secondary | ICD-10-CM | POA: Diagnosis not present

## 2024-02-22 DIAGNOSIS — G8929 Other chronic pain: Secondary | ICD-10-CM | POA: Diagnosis not present

## 2024-02-22 MED ORDER — NONFORMULARY OR COMPOUNDED ITEM: 3 refills | Status: AC

## 2024-02-22 NOTE — Therapy (Signed)
 OUTPATIENT PHYSICAL THERAPY LOWER EXTREMITY .       Patient Name: Margaret [name] MRN: [id number] DOB:18-Feb-1941, 83 y.o., female Today's Date: 02/22/2024  END OF SESSION:  PT End of Session - 02/22/24 1451     Visit Number 25    Date for Recertification  03/30/24    PT Start Time 1445    PT Stop Time 1525    PT Time Calculation (min) 40 min            Past Medical History:  Diagnosis Date   Anxiety    Arthritis    Cancer (HCC)    lt. foot melanoma   Chronic kidney disease    lt. kidney cyst   Coronary artery disease    GERD (gastroesophageal reflux disease)    History of healed stress fracture 2013   bilateral feet   Hx of phlebitis    reports remote hx of superfical blood clots never anticoagulated   Hypertension    Osteopenia    Pneumonia    Past Surgical History:  Procedure Laterality Date   CATARACT EXTRACTION W/ INTRAOCULAR LENS IMPLANT Bilateral    CHOLECYSTECTOMY N/A 09/19/2016   Procedure: LAPAROSCOPIC CHOLECYSTECTOMY POSSIBLE INTRAOPERATIVE CHOLANGIOGRAM;  Surgeon: Donnice Bury, MD;  Location: [facility];  Service: General;  Laterality: N/A;   ERCP N/A 09/18/2016   Procedure: ENDOSCOPIC RETROGRADE CHOLANGIOPANCREATOGRAPHY (ERCP);  Surgeon: Victory LITTIE Legrand DOUGLAS, MD;  Location: [facility] ENDOSCOPY;  Service: Endoscopy;  Laterality: N/A;   FOOT SURGERY     patient reports four foot surgeries   FOOT SURGERY Left    bunion, hammer toe surgery   FOOT SURGERY Right    shaved bunion, hammer toe correction   HERNIA REPAIR     KNEE ARTHROSCOPY Right 06/02/1995   REPLACEMENT TOTAL KNEE Left    REPLACEMENT TOTAL KNEE Left 10/30/2012   RETINAL DETACHMENT SURGERY     RETINAL DETACHMENT SURGERY Left 06/01/2006   ROBOTIC ASSITED PARTIAL NEPHRECTOMY Left 06/03/2022   Procedure: XI ROBOTIC ASSITED PARTIAL NEPHRECTOMY WITH ULTRASOUND AND VENTRAL HERNIA REPAIR;  Surgeon: Alvaro Hummer, MD;  Location: [facility] ORS;  Service: Urology;  Laterality: Left;  3 HRS   THYROIDECTOMY,  PARTIAL Right    THYROIDECTOMY, PARTIAL  06/02/2003   TOTAL KNEE ARTHROPLASTY Right 11/13/2019   Procedure: TOTAL KNEE ARTHROPLASTY SDDC;  Surgeon: Melodi Lerner, MD;  Location: [facility] ORS;  Service: Orthopedics;  Laterality: Right;    Patient Active Problem List   Diagnosis Date Noted   Renal mass 06/03/2022   Open wound of left side of back 01/20/2021   [place] 11/18/2020   Lipoma of back 11/18/2020   Lymphedema 11/18/2020   Melanocytic nevi of right upper limb, including shoulder 11/18/2020   Personal history of malignant melanoma of skin 11/18/2020   Sebaceous cyst 11/18/2020   Seborrheic dermatitis 11/18/2020   Pre-diabetes 04/03/2020   OA (osteoarthritis) of knee 11/13/2019   Total knee replacement status, 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   Pain in right knee 08/03/2019   Fluid collection at surgical site 10/20/2018   Encounter for postoperative examination after surgery for malignant neoplasm 09/30/2018   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/2018   Plantar fasciitis of right foot 02/03/2017   Porokeratosis 02/03/2017   Acute gallstone pancreatitis    Calculus of gallbladder with acute cholecystitis and obstruction    LFT elevation    Stress fracture 01/01/2016   Insomnia 12/16/2015   Peripheral neuropathy 10/18/2015   Renal insufficiency 03/06/2015   HTN (hypertension) 03/06/2015  Osteopenia 03/06/2015   GERD (gastroesophageal reflux disease) 03/06/2015   Vaginal pessary present 03/06/2015   Metatarsalgia 07/13/2012   Increased frequency of urination 04/13/2012   DDD (degenerative disc disease), cervical 01/19/2012   Knee stiffness 01/19/2012   Low back pain 01/19/2012   Neck pain 01/19/2012   Pain of hand 01/19/2012   Hallux valgus 09/20/2011   Hyperkeratosis 08/13/2011   Anxiety disorder 07/06/2011   Depression 07/06/2011   Diverticulosis of large intestine 07/06/2011   Non-toxic multinodular goiter 07/06/2011   Atrophic vaginitis 10/24/2010   Dermatographic  urticaria 09/17/2010   Inflamed seborrheic keratosis 09/17/2010   Closed fracture of fifth metatarsal bone 07/10/2010   Diagnosis unknown 02/27/2010   Hip pain 06/08/2008   Pulmonary nodule seen on imaging study 06/08/2008    PCP: Watt Raisin, MD  REFERRING PROVIDER: Charlie Dolores, MD, [facility]  REFERRING DIAG: degeneration of discs of lumbar spine  THERAPY DIAG:  Chronic bilateral low back pain with right-sided sciatica  Difficulty in walking, not elsewhere classified  Posture imbalance  Chronic bilateral low back pain with left-sided sciatica  Rationale for Evaluation and Treatment: Rehabilitation  ONSET DATE: chronic  SUBJECTIVE:   SUBJECTIVE STATEMENT      I am absolutely worn out PERTINENT HISTORY: Saw orthopedist, took prednisone, which didn't help, orthopedist may want to do MRI, referred to PT by both PCP and orthopedist, also B degenerative arthritis hips PAIN:  Are you having pain? 4/10 back and thighs  PRECAUTIONS: Fall  RED FLAGS: None   WEIGHT BEARING RESTRICTIONS: No  FALLS:  Has patient fallen in last 6 months? Yes. Number of falls several  LIVING ENVIRONMENT: Lives with: lives with their family and lives with their daughter Lives in: House/apartment Stairs: Yes: Internal: one flight steps; on left going up Has following equipment at home: Environmental consultant - 4 wheeled  OCCUPATION: retired  PLOF: Independent and Independent with basic ADLs  PATIENT GOALS: reduce pain hips and lower back so I can tolerate more activity  NEXT MD VISIT: not scheduled yet  OBJECTIVE:  Note: Objective measures were completed at Evaluation unless otherwise noted.  DIAGNOSTIC FINDINGS: X-ray done 11/23; IMPRESSION: 1. 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 2. Lower lumbar spondylosis.  PATIENT SURVEYS:  Modified Oswestry Modified Oswestry Low Back Pain Disability Questionnaire: 29 / 50 = 58.0 %   COGNITION: Overall  cognitive status: Within functional limits for tasks assessed     SENSATION: Light touch: WFL  EDEMA:  None noted   MUSCLE LENGTH: Hamstrings: Right wfl deg; Left wfl deg Debby test: Right wfl deg; Left wfl deg  POSTURE: scoliotic posture noted concavity L lumbar, pronounced thoracic kyphosis  PALPATION: Not tender with light palpation B gr trochanters hips  LOWER EXTREMITY ROM:B hip ROM wnl, able to achieve tailor position each leg in sitting  LUMBAR ROM: FB limited 15%, also risk of falling pt needed UE support Lumbar ext, 25% with marked side bending to L with this motion SB B 35% B with central lower back pain LOWER EXTREMITY MMT:  MMT Right eval Left eval  Hip flexion    Hip extension    Hip abduction    Hip adduction    Hip internal rotation    Hip external rotation    Knee flexion    Knee extension    Ankle dorsiflexion    Ankle plantarflexion    Ankle inversion    Ankle eversion     (Blank rows = not  tested)   FUNCTIONAL TESTS:  5 times sit to stand: 25 sec, required mod assist of PT to avoid falling backwards into chair with final attempt  GAIT: Distance walked: in clinic up to 60  Assistive device utilized: Single point cane, Walker - 2 wheeled, and none Level of assistance: Min A without device and with cane Comments: very unstable without device                                                                                                                                 TREATMENT DATE:  02/22/24 Nustep L 6 10 min Resisted trunk ext 20 x Feet on ball bride, KTC, obl 10 x Iso abdominals 10 x hold 3 sec Green tband clams and hip flex 2 sets 10 Gentle PROM LE and trunk Red tband standing shld ext and row 2 sets 10  02/17/24 Nustep L 6 10 min In // bars foam beam fwd and back tandem 3 x then side stepping 3 x , light HHA STS from standard chair 2 sets 5 cg-min A 6 inch alt step tap 2 sets 10- LOB 2 not clearing foot  20# resisted gait fwd  and back 5 x , then 3 x each side CG-min A with 2 LOB Aura carry 6#  02/15/24 Nustep L 6 10 min STS with wt ball chest press 10 x Resisted trunk flex and ext 20 x Seated row 20# 2 sets 10 Knee ext 5# 2 sets 10 HS curl 20# 2 sets 10 Leg press 20# 3 sets 10   02/10/24 Nustep L 6 10 min STS wt ball chest press 10 x Knee ext 5# 2 sets 10 HS curl 20# 2 sets 10 Leg press 20# 3 sets 10 Tband standing shld ext and row 2 sets 10 Tband seated trunk flex and ext  02/08/24 Amb outside 300 feet with curb sup-CGA without AD with cuing to increase stride as she was taking short shuffle steps esp as she fatigued Nustep L 6 10 min STS wt ball CGA 10 x Light HHA vectro taps Light HHA stepping fwd and back over 2 in pole 3# HHA marching fwd and back 20 feet 2 x 3# HHA side stepping 20 feet 2 x  02/03/24 Nustep L 5 10 min BERG   36/56 Ball toss on airex CGA with 2 LOB STS from elevated mat 2 sets 5 on airex min A Obstacle course stepping on over and around various objects 6 in step taps 10 x each leg then alt CG-min A      02/01/24 Nustep L 5 10 min Amb HHA 5 min outside working on upright posture and cadence Trunk flex and ext 20 x blue tband STS wt ball 10x  HHA on airex stepping on and off fwd,SW and back 10 x each HS curl 25# 2 sets 10 Knee ext 5# 2 sets 10     01/25/24 Nustep L 5  STS wt ball CGA 10 x, cued to not use legs to brace on mat Standing on airex CGA shld ext and row 10 x Standing on airex HHA alt LE 20 x marching,SL hip flex,ext and abd Standing HHA marching fwd and back 20 feet 2 x each and side stepping 20 feet 2 x each Seated resisted trunk flex and ext 20 x Amb 500 feet HHA working on upright trunk, and cadence and she did very well- 2 brief standing rest breaks   01/18/24 Nustep L 5 Goals assessed and documented Knee ext 5# 2 set s10 HS curl 15# 2 sets 10 Resisted seated trunk ext 2 sets 10 Seated row 20# 10 x  15# 10 x 3# ankle wts HHA  marcing fwd and back ward 15 feet 3 x each, side stepping 15 feet 3 x each 3# ankle wt HHA 10 x each SL hip flex,ext and abd  12/30/23 Nustep L 5 Goals assessed and documented STS 5x 16.54 sec Foam beam in // bars 3x side stepping and then tandem fwd and back CGA with light HHA STS with ball toss 10 x Ball toss on airex, only 1 LOB 2 sets 10 Knee ext 5# 2 set s10 HS curl 15# 2 sets 10 [employer] 5# 1 lap each hand Resisted seated trunk ext 2 sets 10 Seated row 15# 2 sets 10     12/13/23 Nustep L 5 8 min  Knee ext 5# 2 set s10 HS curl 15# 2 sets 10 STS with 4# chest press 10x ADD ball squeeze 2x15 Hip abd w/annual resistance 2x15 Alt 4in box taps CGA 2x10  12/10/23 STS with 4# chest press 10x Assessed and documented goals Nustep L 5 Resisted gait 20# 3 x 4 ways- min A Knee ext 5# 2 set s10 HS curl 15# 2 sets 10  12/07/23 Resisted trunk flex and ext 20 x Seated resisted row and shld ext 2 sets 10 Nustep L 5 8 min Green tband HS curl 2 sets 10 3# LAQ 2 sets 10 3# HHA SL hip flex,ext and abd 10 x each- decreased RT LE mvmt ADD ball squeeze 15 x    12/02/23 Nustep L 5 BM for vacuuming to decrease strain on back Green tband HS curl,hip flex and clams seated 2 sets 10 ADD ball squeeze 2 sets 10 3# LAQ 2 sets 10 Red tband seated row and shld ext 2 sets 10 Tband trunk ext 2 sets 10 Standing with RW 3# hip ext and 10 x each      09/20/23:   Evaluation, explained to pt that long term management of arthritic changes in hips in spine, should respond well to moderate exercise. Practiced gait with st cane, pt without any marked improvement in her stability with the use of the cane.  Therefore practiced with front wheeled walker, marked improvement in gait pattern . Recommended that she use a front wheeled walker which is more light weight and easier to maneuver in and out of car for outings, she is a high fall risk.  PATIENT EDUCATION:  Education details:  POC, goals Person educated: Patient Education method: Explanation, Demonstration, Tactile cues, and Verbal cues Education comprehension: verbalized understanding and returned demonstration  HOME EXERCISE PROGRAM: Green t band hip abd/ER in sitting and green t band ankle pumps L .   ASSESSMENT:  CLINICAL IMPRESSION:    pt came in very sore all over so we adjusted session to help relax back  and decrease soreness,  OBJECTIVE IMPAIRMENTS: decreased activity tolerance, decreased balance, decreased knowledge of condition, decreased knowledge of use of DME, decreased mobility, difficulty walking, decreased ROM, decreased strength, decreased safety awareness, impaired perceived functional ability, postural dysfunction, and pain.   ACTIVITY LIMITATIONS: carrying, lifting, bending, standing, squatting, transfers, bed mobility, and locomotion level  PARTICIPATION LIMITATIONS: meal prep, cleaning, laundry, shopping, community activity, and yard work  PERSONAL FACTORS: Age, Behavior pattern, Fitness, Past/current experiences, Time since onset of injury/illness/exacerbation, and 1-2 comorbidities: severe DJD B hips, lumbar region, neuropathy are also affecting patient's functional outcome.   REHAB POTENTIAL: Good  CLINICAL DECISION MAKING: Evolving/moderate complexity  EVALUATION COMPLEXITY: Moderate   GOALS: Goals reviewed with patient? Yes  SHORT TERM GOALS: Target date: 2 weeks 5 5/25 I HEP Baseline: Goal status: MET 10/26/23   LONG TERM GOALS: Target date: 12 weeks, 02/10/24  Modified Oswestry Low Back Pain Disability Questionnaire: 29 / 50 = 58.0 %  Baseline:  Goal status: 11/11/23 progressing  and 11/18/23  11/30/23 progressing 12/10/23 progressing   12/30/23 progressing   01/18/24 progressing 02/10/24 MET  2.  Safe gait with appropriate device , over 350' for safe household distances, without balance loss Baseline:  Goal status: on going 10/26/23  progressing 11/04/23  and 11/11/23   Progressing 11/18/23  progressing 11/30/23 distance met but unsteady  12/10/23  amb without AD 250 feet, no LOB  but instability with lateral sway 12/30/23 progressing 250 feet then slows down and balance decreases 01/18/24 progressing  Progressing and showing good improvements 02/01/24 02/10/24 progressing and 02/17/24  3.  5 x sit to stand 14.8 sec or less without balance loss Baseline: 25 sec,with UE support B  required mod assist on final rep to avoid fall  Goal status: progressing 10/26/23  progressing 11/05/23  11/18/23 multi tries and posterior lean,progressing  11/30/23 LOB progressing  12/10/23 20 sec 12/30/23 progressing   01/18/24 18.5 sec with 1 x bracing knees 02/01/24 progressing uses knees to brace with 02/10/24 16 sec progressing  4. Will increase BERG score to 45 to demonstrate improved balance   Baseline: 36  Goal status: progressing 02/17/24    PLAN:  PT FREQUENCY: 2x/week  PT DURATION: 12 weeks  PLANNED INTERVENTIONS: 97110-Therapeutic exercises, 97530- Therapeutic activity, 97112- Neuromuscular re-education, 97535- Self Care, 02859- Manual therapy, G0283- Electrical stimulation (unattended), and 97033- Ionotophoresis 4mg /ml Dexamethasone   PLAN FOR NEXT SESSION:  progress func balance and strength    Kennette Cuthrell,ANGIE, PTA,  02/22/2024, 2:52 PM [facility] [address] [facility] [place], [place], [address] Phone: [phone]   Fax:  [phone]

## 2024-02-25 NOTE — Progress Notes (Signed)
 HPI: FU syncope. Previously seen by neurology and felt to have a seizure versus syncope.  EEG and ambulatory EEG normal.  MRI with no significant abnormality.  At time of previous evaluation in October 2021 we felt that these were likely orthostatic mediated with component of increased vagal tone.  Her hydrochlorothiazide  was discontinued and we asked her to increase hydration and sodium intake.  We also discussed compression hose.  Echocardiogram November 2021 showed normal LV function, grade 1 diastolic dysfunction, moderate left atrial enlargement, mild mitral regurgitation, trace aortic insufficiency.  Since last seen the patient denies any dyspnea on exertion, orthopnea, PND, pedal edema, palpitations, syncope or chest pain.   Current Outpatient Medications  Medication Sig Dispense Refill   acetaminophen  (TYLENOL ) 500 MG tablet 1000 mg by oral route.     diclofenac Sodium (VOLTAREN) 1 % GEL Apply 2 g topically 4 (four) times daily as needed (pain).     gabapentin  (NEURONTIN ) 300 MG capsule TAKE 1 CAPSULE BY MOUTH 3 TIMES A DAY 90 capsule 3   hydrochlorothiazide  (HYDRODIURIL ) 12.5 MG tablet Take 1 tablet (12.5 mg total) by mouth daily as needed. 90 tablet 0   HYDROcodone -acetaminophen  (NORCO) 7.5-325 MG tablet Take 1 tablet by mouth every 8 (eight) hours as needed for moderate pain (pain score 4-6). 45 tablet 0   ketoconazole (NIZORAL) 2 % cream APPLY TO THE AFFECTED AREA EVERY DAY FOR 15 DAYS     metoprolol  succinate (TOPROL -XL) 25 MG 24 hr tablet Take 1 tablet (25 mg total) by mouth daily. TAKE WITH OR IMMEDIATELY FOLLOWING A MEAL. 90 tablet 1   NONFORMULARY OR COMPOUNDED ITEM [facility]:  Peripheral Neuropathy - Bupivacaine  1%, Doxepin 3%, Gabapentin  6%, Pentoxifylline 3%, Topiramate 1%. Apply 1-2 grams to affected area 3-4 times daily. 60 each 3   omeprazole  (PRILOSEC) 20 MG capsule TAKE 1 CAPSULE BY MOUTH DAILY 90 capsule 1   pravastatin  (PRAVACHOL ) 20 MG tablet TAKE 1 TABLET  BY MOUTH DAILY 90 tablet 1   No current facility-administered medications for this visit.     Past Medical History:  Diagnosis Date   Anxiety    Arthritis    Cancer (HCC)    lt. foot melanoma   Chronic kidney disease    lt. kidney cyst   Coronary artery disease    GERD (gastroesophageal reflux disease)    History of healed stress fracture 2013   bilateral feet   Hx of phlebitis    reports remote hx of superfical blood clots never anticoagulated   Hypertension    Osteopenia    Pneumonia     Past Surgical History:  Procedure Laterality Date   CATARACT EXTRACTION W/ INTRAOCULAR LENS IMPLANT Bilateral    CHOLECYSTECTOMY N/A 09/19/2016   Procedure: LAPAROSCOPIC CHOLECYSTECTOMY POSSIBLE INTRAOPERATIVE CHOLANGIOGRAM;  Surgeon: Donnice Bury, MD;  Location: [facility] OR;  Service: General;  Laterality: N/A;   ERCP N/A 09/18/2016   Procedure: ENDOSCOPIC RETROGRADE CHOLANGIOPANCREATOGRAPHY (ERCP);  Surgeon: Victory LITTIE Legrand DOUGLAS, MD;  Location[facility] ENDOSCOPY;  Service: Endoscopy;  Laterality: N/A;   FOOT SURGERY     patient reports four foot surgeries   FOOT SURGERY Left    bunion, hammer toe surgery   FOOT SURGERY Right    shaved bunion, hammer toe correction   HERNIA REPAIR     KNEE ARTHROSCOPY Right 06/02/1995   REPLACEMENT TOTAL KNEE Left    REPLACEMENT TOTAL KNEE Left 10/30/2012   RETINAL DETACHMENT SURGERY     RETINAL DETACHMENT SURGERY Left 06/01/2006  ROBOTIC ASSITED PARTIAL NEPHRECTOMY Left 06/03/2022   Procedure: XI ROBOTIC ASSITED PARTIAL NEPHRECTOMY WITH ULTRASOUND AND VENTRAL HERNIA REPAIR;  Surgeon: Alvaro Hummer, MD;  Location: [facility];  Service: Urology;  Laterality: Left;  3 HRS   THYROIDECTOMY, PARTIAL Right    THYROIDECTOMY, PARTIAL  06/02/2003   TOTAL KNEE ARTHROPLASTY Right 11/13/2019   Procedure: TOTAL KNEE ARTHROPLASTY SDDC;  Surgeon: Melodi Lerner, MD;  Location: [facility];  Service: Orthopedics;  Laterality: Right;     Social History   Socioeconomic  History   Marital status: Widowed    Spouse name: Not on file   Number of children: 3   Years of education: Not on file   Highest education level: 12th grade  Occupational History   Not on file  Tobacco Use   Smoking status: Former    Current packs/day: 0.00    Average packs/day: 0.3 packs/day for 15.0 years (3.8 ttl pk-yrs)    Types: Cigarettes    Start date: 06/01/1964    Quit date: 06/02/1979    Years since quitting: 44.7   Smokeless tobacco: Never  Vaping Use   Vaping status: Never Used  Substance and Sexual Activity   Alcohol use: Yes    Alcohol/week: 1.0 standard drink of alcohol    Types: 1 Glasses of wine per week    Comment: Occ   Drug use: No   Sexual activity: Not Currently  Other Topics Concern   Not on file  Social History Narrative   ** Merged History Encounter **       Married   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   3 children-[name] Sharron, son here, on daughter in [place]    7 grandchildren   1 great grandson    Left Handed   Social Drivers of Health   Financial Resource Strain: Low Risk  (02/13/2024)   Overall Financial Resource Strain (CARDIA)    Difficulty of Paying Living Expenses: Not hard at all  Food Insecurity: No Food Insecurity (02/13/2024)   Hunger Vital Sign    Worried About Running Out of Food in the Last Year: Never true    Ran Out of Food in the Last Year: Never true  Transportation Needs: No Transportation Needs (02/13/2024)   PRAPARE - Administrator, Civil Service (Medical): No    Lack of Transportation (Non-Medical): No  Physical Activity: Insufficiently Active (02/13/2024)   Exercise Vital Sign    Days of Exercise per Week: 2 days    Minutes of Exercise per Session: 40 min  Stress: No Stress Concern Present (02/13/2024)   [employer] of Occupational Health - Occupational Stress Questionnaire    Feeling of Stress: Only a little  Social Connections: Moderately Integrated (02/13/2024)   Social  Connection and Isolation Panel    Frequency of Communication with Friends and Family: More than three times a week    Frequency of Social Gatherings with Friends and Family: More than three times a week    Attends Religious Services: More than 4 times per year    Active Member of [employer] or Organizations: Yes    Attends Banker Meetings: More than 4 times per year    Marital Status: Widowed  Intimate Partner Violence: Not At Risk (06/03/2022)   Humiliation, Afraid, Rape, and Kick questionnaire    Fear of Current or Ex-Partner: No    Emotionally Abused: No    Physically Abused: No    Sexually Abused: No  Family History  Problem Relation Age of Onset   Heart disease Mother    Heart attack Mother    Heart disease Father    Diabetes Maternal Grandmother    Cancer Paternal Grandfather        stomach    ROS: no fevers or chills, productive cough, hemoptysis, dysphasia, odynophagia, melena, hematochezia, dysuria, hematuria, rash, seizure activity, orthopnea, PND, pedal edema, claudication. Remaining systems are negative.  Physical Exam: Well-developed well-nourished in no acute distress.  Skin is warm and dry.  HEENT is normal.  Neck is supple.  Chest is clear to auscultation with normal expansion.  Cardiovascular exam is regular rate and rhythm.  Abdominal exam nontender or distended. No masses palpated. Extremities show no edema. neuro grossly intact  EKG Interpretation Date/Time:  Wednesday March 01 2024 12:08:38 EDT Ventricular Rate:  71 PR Interval:  146 QRS Duration:  80 QT Interval:  376 QTC Calculation: 408 R Axis:   159  Text Interpretation: Normal sinus rhythm Right axis deviation Confirmed by Pietro Rogue ([id number]) on 03/01/2024 12:11:13 PM    A/P  1 history of syncope-no recurrence since since last office visit.  Previous echocardiogram showed preserved LV function.  Continue to monitor.  2 history of hypertension-I have allowed her blood  pressure to run higher given history of orthostatic symptoms and syncope.  It is somewhat high today but she states her systolic is typically in the 120-130 range.  We will follow and adjust as needed.  Rogue Pietro, MD

## 2024-02-29 ENCOUNTER — Ambulatory Visit: Admitting: Physical Therapy

## 2024-02-29 DIAGNOSIS — G8929 Other chronic pain: Secondary | ICD-10-CM

## 2024-02-29 DIAGNOSIS — R293 Abnormal posture: Secondary | ICD-10-CM | POA: Diagnosis not present

## 2024-02-29 DIAGNOSIS — R262 Difficulty in walking, not elsewhere classified: Secondary | ICD-10-CM | POA: Diagnosis not present

## 2024-02-29 DIAGNOSIS — M5442 Lumbago with sciatica, left side: Secondary | ICD-10-CM | POA: Diagnosis not present

## 2024-02-29 DIAGNOSIS — M5441 Lumbago with sciatica, right side: Secondary | ICD-10-CM | POA: Diagnosis not present

## 2024-02-29 NOTE — Therapy (Signed)
 OUTPATIENT PHYSICAL THERAPY LOWER EXTREMITY .       Patient Name: Margaret [name] MRN: [id number] DOB:1940/12/28, 83 y.o., female Today's Date: 02/29/2024  END OF SESSION:  PT End of Session - 02/29/24 1418     Visit Number 26    Date for Recertification  03/30/24    PT Start Time 1400    PT Stop Time 1445    PT Time Calculation (min) 45 min            Past Medical History:  Diagnosis Date   Anxiety    Arthritis    Cancer (HCC)    lt. foot melanoma   Chronic kidney disease    lt. kidney cyst   Coronary artery disease    GERD (gastroesophageal reflux disease)    History of healed stress fracture 2013   bilateral feet   Hx of phlebitis    reports remote hx of superfical blood clots never anticoagulated   Hypertension    Osteopenia    Pneumonia    Past Surgical History:  Procedure Laterality Date   CATARACT EXTRACTION W/ INTRAOCULAR LENS IMPLANT Bilateral    CHOLECYSTECTOMY N/A 09/19/2016   Procedure: LAPAROSCOPIC CHOLECYSTECTOMY POSSIBLE INTRAOPERATIVE CHOLANGIOGRAM;  Surgeon: Donnice Bury, MD;  Location: [facility] OR;  Service: General;  Laterality: N/A;   ERCP N/A 09/18/2016   Procedure: ENDOSCOPIC RETROGRADE CHOLANGIOPANCREATOGRAPHY (ERCP);  Surgeon: Victory LITTIE Legrand DOUGLAS, MD;  Location: [facility] ENDOSCOPY;  Service: Endoscopy;  Laterality: N/A;   FOOT SURGERY     patient reports four foot surgeries   FOOT SURGERY Left    bunion, hammer toe surgery   FOOT SURGERY Right    shaved bunion, hammer toe correction   HERNIA REPAIR     KNEE ARTHROSCOPY Right 06/02/1995   REPLACEMENT TOTAL KNEE Left    REPLACEMENT TOTAL KNEE Left 10/30/2012   RETINAL DETACHMENT SURGERY     RETINAL DETACHMENT SURGERY Left 06/01/2006   ROBOTIC ASSITED PARTIAL NEPHRECTOMY Left 06/03/2022   Procedure: XI ROBOTIC ASSITED PARTIAL NEPHRECTOMY WITH ULTRASOUND AND VENTRAL HERNIA REPAIR;  Surgeon: Alvaro Hummer, MD;  Location: [facility] ORS;  Service: Urology;  Laterality: Left;  3 HRS   THYROIDECTOMY,  PARTIAL Right    THYROIDECTOMY, PARTIAL  06/02/2003   TOTAL KNEE ARTHROPLASTY Right 11/13/2019   Procedure: TOTAL KNEE ARTHROPLASTY SDDC;  Surgeon: Melodi Lerner, MD;  Location: [facility] ORS;  Service: Orthopedics;  Laterality: Right;    Patient Active Problem List   Diagnosis Date Noted   Renal mass 06/03/2022   Open wound of left side of back 01/20/2021   [place] 11/18/2020   Lipoma of back 11/18/2020   Lymphedema 11/18/2020   Melanocytic nevi of right upper limb, including shoulder 11/18/2020   Personal history of malignant melanoma of skin 11/18/2020   Sebaceous cyst 11/18/2020   Seborrheic dermatitis 11/18/2020   Pre-diabetes 04/03/2020   OA (osteoarthritis) of knee 11/13/2019   Total knee replacement status, 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   Pain in right knee 08/03/2019   Fluid collection at surgical site 10/20/2018   Encounter for postoperative examination after surgery for malignant neoplasm 09/30/2018   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/2018   Plantar fasciitis of right foot 02/03/2017   Porokeratosis 02/03/2017   Acute gallstone pancreatitis    Calculus of gallbladder with acute cholecystitis and obstruction    LFT elevation    Stress fracture 01/01/2016   Insomnia 12/16/2015   Peripheral neuropathy 10/18/2015   Renal insufficiency 03/06/2015   HTN (hypertension) 03/06/2015  Osteopenia 03/06/2015   GERD (gastroesophageal reflux disease) 03/06/2015   Vaginal pessary present 03/06/2015   Metatarsalgia 07/13/2012   Increased frequency of urination 04/13/2012   DDD (degenerative disc disease), cervical 01/19/2012   Knee stiffness 01/19/2012   Low back pain 01/19/2012   Neck pain 01/19/2012   Pain of hand 01/19/2012   Hallux valgus 09/20/2011   Hyperkeratosis 08/13/2011   Anxiety disorder 07/06/2011   Depression 07/06/2011   Diverticulosis of large intestine 07/06/2011   Non-toxic multinodular goiter 07/06/2011   Atrophic vaginitis 10/24/2010   Dermatographic  urticaria 09/17/2010   Inflamed seborrheic keratosis 09/17/2010   Closed fracture of fifth metatarsal bone 07/10/2010   Diagnosis unknown 02/27/2010   Hip pain 06/08/2008   Pulmonary nodule seen on imaging study 06/08/2008    PCP: Watt Raisin, MD  REFERRING PROVIDER: Charlie Dolores, MD, [facility]  REFERRING DIAG: degeneration of discs of lumbar spine  THERAPY DIAG:  Chronic bilateral low back pain with right-sided sciatica  Difficulty in walking, not elsewhere classified  Posture imbalance  Rationale for Evaluation and Treatment: Rehabilitation  ONSET DATE: chronic  SUBJECTIVE:   SUBJECTIVE STATEMENT    tired but did a lot of walking and did well PERTINENT HISTORY: Saw orthopedist, took prednisone, which didn't help, orthopedist may want to do MRI, referred to PT by both PCP and orthopedist, also B degenerative arthritis hips PAIN:  Are you having pain? 4/10 back and thighs  PRECAUTIONS: Fall  RED FLAGS: None   WEIGHT BEARING RESTRICTIONS: No  FALLS:  Has patient fallen in last 6 months? Yes. Number of falls several  LIVING ENVIRONMENT: Lives with: lives with their family and lives with their daughter Lives in: House/apartment Stairs: Yes: Internal: one flight steps; on left going up Has following equipment at home: Environmental consultant - 4 wheeled  OCCUPATION: retired  PLOF: Independent and Independent with basic ADLs  PATIENT GOALS: reduce pain hips and lower back so I can tolerate more activity  NEXT MD VISIT: not scheduled yet  OBJECTIVE:  Note: Objective measures were completed at Evaluation unless otherwise noted.  DIAGNOSTIC FINDINGS: X-ray done 11/23; IMPRESSION: 1. 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 2. Lower lumbar spondylosis.  PATIENT SURVEYS:  Modified Oswestry Modified Oswestry Low Back Pain Disability Questionnaire: 29 / 50 = 58.0 %   COGNITION: Overall cognitive status: Within functional limits for  tasks assessed     SENSATION: Light touch: WFL  EDEMA:  None noted   MUSCLE LENGTH: Hamstrings: Right wfl deg; Left wfl deg Debby test: Right wfl deg; Left wfl deg  POSTURE: scoliotic posture noted concavity L lumbar, pronounced thoracic kyphosis  PALPATION: Not tender with light palpation B gr trochanters hips  LOWER EXTREMITY ROM:B hip ROM wnl, able to achieve tailor position each leg in sitting  LUMBAR ROM: FB limited 15%, also risk of falling pt needed UE support Lumbar ext, 25% with marked side bending to L with this motion SB B 35% B with central lower back pain LOWER EXTREMITY MMT:  MMT Right eval Left eval  Hip flexion    Hip extension    Hip abduction    Hip adduction    Hip internal rotation    Hip external rotation    Knee flexion    Knee extension    Ankle dorsiflexion    Ankle plantarflexion    Ankle inversion    Ankle eversion     (Blank rows = not tested)   FUNCTIONAL TESTS:  5 times sit to stand: 25 sec, required mod assist of PT to avoid falling backwards into chair with final attempt  GAIT: Distance walked: in clinic up to 60  Assistive device utilized: Single point cane, Walker - 2 wheeled, and none Level of assistance: Min A without device and with cane Comments: very unstable without device                                                                                                                                 TREATMENT DATE:   02/29/24 Nustep L 6 10 min Stepping over various heights and directions- HHA with LOB STS with wt ball chest press 10x [employer] carry 7# 1 lap each hand Resisted trunk ext 20 x HHA red tband SL hip flex,ext and abd 10 x Green tband shld ext and row 2 sets 10     02/22/24 Nustep L 6 10 min Resisted trunk ext 20 x Feet on ball bride, KTC, obl 10 x Iso abdominals 10 x hold 3 sec Green tband clams and hip flex 2 sets 10 Gentle PROM LE and trunk Red tband standing shld ext and row 2 sets  10  02/17/24 Nustep L 6 10 min In // bars foam beam fwd and back tandem 3 x then side stepping 3 x , light HHA STS from standard chair 2 sets 5 cg-min A 6 inch alt step tap 2 sets 10- LOB 2 not clearing foot  20# resisted gait fwd and back 5 x , then 3 x each side CG-min A with 2 LOB Aura carry 6#  02/15/24 Nustep L 6 10 min STS with wt ball chest press 10 x Resisted trunk flex and ext 20 x Seated row 20# 2 sets 10 Knee ext 5# 2 sets 10 HS curl 20# 2 sets 10 Leg press 20# 3 sets 10   02/10/24 Nustep L 6 10 min STS wt ball chest press 10 x Knee ext 5# 2 sets 10 HS curl 20# 2 sets 10 Leg press 20# 3 sets 10 Tband standing shld ext and row 2 sets 10 Tband seated trunk flex and ext  02/08/24 Amb outside 300 feet with curb sup-CGA without AD with cuing to increase stride as she was taking short shuffle steps esp as she fatigued Nustep L 6 10 min STS wt ball CGA 10 x Light HHA vectro taps Light HHA stepping fwd and back over 2 in pole 3# HHA marching fwd and back 20 feet 2 x 3# HHA side stepping 20 feet 2 x  02/03/24 Nustep L 5 10 min BERG   36/56 Ball toss on airex CGA with 2 LOB STS from elevated mat 2 sets 5 on airex min A Obstacle course stepping on over and around various objects 6 in step taps 10 x each leg then alt CG-min A      02/01/24 Nustep L 5 10 min Amb HHA 5 min outside  working on upright posture and cadence Trunk flex and ext 20 x blue tband STS wt ball 10x  HHA on airex stepping on and off fwd,SW and back 10 x each HS curl 25# 2 sets 10 Knee ext 5# 2 sets 10     01/25/24 Nustep L 5 STS wt ball CGA 10 x, cued to not use legs to brace on mat Standing on airex CGA shld ext and row 10 x Standing on airex HHA alt LE 20 x marching,SL hip flex,ext and abd Standing HHA marching fwd and back 20 feet 2 x each and side stepping 20 feet 2 x each Seated resisted trunk flex and ext 20 x Amb 500 feet HHA working on upright trunk, and cadence and she did  very well- 2 brief standing rest breaks   01/18/24 Nustep L 5 Goals assessed and documented Knee ext 5# 2 set s10 HS curl 15# 2 sets 10 Resisted seated trunk ext 2 sets 10 Seated row 20# 10 x  15# 10 x 3# ankle wts HHA marcing fwd and back ward 15 feet 3 x each, side stepping 15 feet 3 x each 3# ankle wt HHA 10 x each SL hip flex,ext and abd  12/30/23 Nustep L 5 Goals assessed and documented STS 5x 16.54 sec Foam beam in // bars 3x side stepping and then tandem fwd and back CGA with light HHA STS with ball toss 10 x Ball toss on airex, only 1 LOB 2 sets 10 Knee ext 5# 2 set s10 HS curl 15# 2 sets 10 [employer] 5# 1 lap each hand Resisted seated trunk ext 2 sets 10 Seated row 15# 2 sets 10     12/13/23 Nustep L 5 8 min  Knee ext 5# 2 set s10 HS curl 15# 2 sets 10 STS with 4# chest press 10x ADD ball squeeze 2x15 Hip abd w/annual resistance 2x15 Alt 4in box taps CGA 2x10  12/10/23 STS with 4# chest press 10x Assessed and documented goals Nustep L 5 Resisted gait 20# 3 x 4 ways- min A Knee ext 5# 2 set s10 HS curl 15# 2 sets 10  12/07/23 Resisted trunk flex and ext 20 x Seated resisted row and shld ext 2 sets 10 Nustep L 5 8 min Green tband HS curl 2 sets 10 3# LAQ 2 sets 10 3# HHA SL hip flex,ext and abd 10 x each- decreased RT LE mvmt ADD ball squeeze 15 x    12/02/23 Nustep L 5 BM for vacuuming to decrease strain on back Green tband HS curl,hip flex and clams seated 2 sets 10 ADD ball squeeze 2 sets 10 3# LAQ 2 sets 10 Red tband seated row and shld ext 2 sets 10 Tband trunk ext 2 sets 10 Standing with RW 3# hip ext and 10 x each      09/20/23:   Evaluation, explained to pt that long term management of arthritic changes in hips in spine, should respond well to moderate exercise. Practiced gait with st cane, pt without any marked improvement in her stability with the use of the cane.  Therefore practiced with front wheeled walker,  marked improvement in gait pattern . Recommended that she use a front wheeled walker which is more light weight and easier to maneuver in and out of car for outings, she is a high fall risk.  PATIENT EDUCATION:  Education details: POC, goals Person educated: Patient Education method: Explanation, Demonstration, Tactile cues, and  Verbal cues Education comprehension: verbalized understanding and returned demonstration  HOME EXERCISE PROGRAM: Green t band hip abd/ER in sitting and green t band ankle pumps L .   ASSESSMENT:  CLINICAL IMPRESSION:    pt continues to show improvements with gait speed and even stated did a flight of steps over weekend and grand daughter college. High level balance ex are still very challenging and fearful for pt.  OBJECTIVE IMPAIRMENTS: decreased activity tolerance, decreased balance, decreased knowledge of condition, decreased knowledge of use of DME, decreased mobility, difficulty walking, decreased ROM, decreased strength, decreased safety awareness, impaired perceived functional ability, postural dysfunction, and pain.   ACTIVITY LIMITATIONS: carrying, lifting, bending, standing, squatting, transfers, bed mobility, and locomotion level  PARTICIPATION LIMITATIONS: meal prep, cleaning, laundry, shopping, community activity, and yard work  PERSONAL FACTORS: Age, Behavior pattern, Fitness, Past/current experiences, Time since onset of injury/illness/exacerbation, and 1-2 comorbidities: severe DJD B hips, lumbar region, neuropathy are also affecting patient's functional outcome.   REHAB POTENTIAL: Good  CLINICAL DECISION MAKING: Evolving/moderate complexity  EVALUATION COMPLEXITY: Moderate   GOALS: Goals reviewed with patient? Yes  SHORT TERM GOALS: Target date: 2 weeks 5 5/25 I HEP Baseline: Goal status: MET 10/26/23   LONG TERM GOALS: Target date: 12 weeks, 02/10/24  Modified Oswestry Low Back Pain Disability Questionnaire: 29 / 50 = 58.0 %  Baseline:   Goal status: 11/11/23 progressing  and 11/18/23  11/30/23 progressing 12/10/23 progressing   12/30/23 progressing   01/18/24 progressing 02/10/24 MET  2.  Safe gait with appropriate device , over 350' for safe household distances, without balance loss Baseline:  Goal status: on going 10/26/23  progressing 11/04/23  and 11/11/23  Progressing 11/18/23  progressing 11/30/23 distance met but unsteady  12/10/23  amb without AD 250 feet, no LOB  but instability with lateral sway 12/30/23 progressing 250 feet then slows down and balance decreases 01/18/24 progressing  Progressing and showing good improvements 02/01/24 02/10/24 progressing and 02/17/24  3.  5 x sit to stand 14.8 sec or less without balance loss Baseline: 25 sec,with UE support B  required mod assist on final rep to avoid fall  Goal status: progressing 10/26/23  progressing 11/05/23  11/18/23 multi tries and posterior lean,progressing  11/30/23 LOB progressing  12/10/23 20 sec 12/30/23 progressing   01/18/24 18.5 sec with 1 x bracing knees 02/01/24 progressing uses knees to brace with 02/10/24 16 sec progressing  4. Will increase BERG score to 45 to demonstrate improved balance   Baseline: 36  Goal status: progressing 02/17/24  and 02/29/24    PLAN:  PT FREQUENCY: 2x/week  PT DURATION: 12 weeks  PLANNED INTERVENTIONS: 97110-Therapeutic exercises, 97530- Therapeutic activity, 97112- Neuromuscular re-education, 97535- Self Care, 02859- Manual therapy, G0283- Electrical stimulation (unattended), and 97033- Ionotophoresis 4mg /ml Dexamethasone   PLAN FOR NEXT SESSION:  progress func balance and strength    Taym Twist,ANGIE, PTA,  02/29/2024, 2:19 PM [place] [facility] [address] [facility] [place], [place], [address] Phone: [phone]   Fax:  [phone]

## 2024-03-01 ENCOUNTER — Encounter: Payer: Self-pay | Admitting: Cardiology

## 2024-03-01 ENCOUNTER — Ambulatory Visit: Attending: Cardiology | Admitting: Cardiology

## 2024-03-01 VITALS — BP 171/75 | HR 71 | Ht 61.0 in

## 2024-03-01 DIAGNOSIS — I10 Essential (primary) hypertension: Secondary | ICD-10-CM | POA: Diagnosis not present

## 2024-03-01 DIAGNOSIS — Z87898 Personal history of other specified conditions: Secondary | ICD-10-CM | POA: Insufficient documentation

## 2024-03-01 NOTE — Patient Instructions (Signed)
  Follow-Up: At [facility], you and your health needs are our priority.  As part of our continuing mission to provide you with exceptional heart care, our providers are all part of one team.  This team includes your primary Cardiologist (physician) and Advanced Practice Providers or APPs (Physician Assistants and Nurse Practitioners) who all work together to provide you with the care you need, when you need it.  Your next appointment:    As needed

## 2024-03-06 ENCOUNTER — Other Ambulatory Visit: Payer: Self-pay | Admitting: Family Medicine

## 2024-03-06 DIAGNOSIS — G8929 Other chronic pain: Secondary | ICD-10-CM

## 2024-03-06 DIAGNOSIS — K219 Gastro-esophageal reflux disease without esophagitis: Secondary | ICD-10-CM

## 2024-03-06 DIAGNOSIS — M25551 Pain in right hip: Secondary | ICD-10-CM

## 2024-03-06 DIAGNOSIS — M159 Polyosteoarthritis, unspecified: Secondary | ICD-10-CM

## 2024-03-06 DIAGNOSIS — M5440 Lumbago with sciatica, unspecified side: Secondary | ICD-10-CM

## 2024-03-06 MED ORDER — HYDROCODONE-ACETAMINOPHEN 7.5-325 MG PO TABS: 1.0000 | ORAL_TABLET | Freq: Three times a day (TID) | ORAL | 0 refills | Status: DC | PRN

## 2024-03-06 NOTE — Telephone Encounter (Signed)
 Copied from CRM #[id number]. Topic: Clinical - Medication Refill >> Mar 06, 2024 12:09 PM Jasmin G wrote: Medication: HYDROcodone -acetaminophen  (NORCO) 7.5-325 MG tablet  Has the patient contacted their pharmacy? No (Agent: If no, request that the patient contact the pharmacy for the refill. If patient does not wish to contact the pharmacy document the reason why and proceed with request.) (Agent: If yes, when and what did the pharmacy advise?)  This is the patient's preferred pharmacy:  [facility] [id number] [place], [place] - [address] [place] [address] Phone: [phone] Fax: [phone]  Is this the correct pharmacy for this prescription? Yes If no, delete pharmacy and type the correct one.   Has the prescription been filled recently? Yes  Is the patient out of the medication? No  Has the patient been seen for an appointment in the last year OR does the patient have an upcoming appointment? Yes  Can we respond through MyChart? Yes  Agent: Please be advised that Rx refills may take up to 3 business days. We ask that you follow-up with your pharmacy.

## 2024-03-07 ENCOUNTER — Ambulatory Visit: Attending: Family Medicine | Admitting: Physical Therapy

## 2024-03-07 DIAGNOSIS — M5442 Lumbago with sciatica, left side: Secondary | ICD-10-CM | POA: Insufficient documentation

## 2024-03-07 DIAGNOSIS — M5441 Lumbago with sciatica, right side: Secondary | ICD-10-CM | POA: Insufficient documentation

## 2024-03-07 DIAGNOSIS — G8929 Other chronic pain: Secondary | ICD-10-CM | POA: Insufficient documentation

## 2024-03-07 DIAGNOSIS — R293 Abnormal posture: Secondary | ICD-10-CM | POA: Diagnosis not present

## 2024-03-07 DIAGNOSIS — R262 Difficulty in walking, not elsewhere classified: Secondary | ICD-10-CM | POA: Insufficient documentation

## 2024-03-07 NOTE — Therapy (Signed)
 OUTPATIENT PHYSICAL THERAPY LOWER EXTREMITY .       Patient Name: Margaret [name] MRN: [id number] DOB:[birth year], 83 y.o., female Today's Date: 03/07/2024  END OF SESSION:  PT End of Session - 03/07/24 1434     Visit Number 27    Date for Recertification  03/30/24    PT Start Time 1400    PT Stop Time 1440    PT Time Calculation (min) 40 min            Past Medical History:  Diagnosis Date   Anxiety    Arthritis    Cancer (HCC)    lt. foot melanoma   Chronic kidney disease    lt. kidney cyst   Coronary artery disease    GERD (gastroesophageal reflux disease)    History of healed stress fracture 2013   bilateral feet   Hx of phlebitis    reports remote hx of superfical blood clots never anticoagulated   Hypertension    Osteopenia    Pneumonia    Past Surgical History:  Procedure Laterality Date   CATARACT EXTRACTION W/ INTRAOCULAR LENS IMPLANT Bilateral    CHOLECYSTECTOMY N/A 09/19/2016   Procedure: LAPAROSCOPIC CHOLECYSTECTOMY POSSIBLE INTRAOPERATIVE CHOLANGIOGRAM;  Surgeon: Donnice Bury, MD;  Location: [facility] OR;  Service: General;  Laterality: N/A;   ERCP N/A 09/18/2016   Procedure: ENDOSCOPIC RETROGRADE CHOLANGIOPANCREATOGRAPHY (ERCP);  Surgeon: Victory LITTIE Legrand DOUGLAS, MD;  Location: [facility] ENDOSCOPY;  Service: Endoscopy;  Laterality: N/A;   FOOT SURGERY     patient reports four foot surgeries   FOOT SURGERY Left    bunion, hammer toe surgery   FOOT SURGERY Right    shaved bunion, hammer toe correction   HERNIA REPAIR     KNEE ARTHROSCOPY Right 06/02/1995   REPLACEMENT TOTAL KNEE Left    REPLACEMENT TOTAL KNEE Left 10/30/2012   RETINAL DETACHMENT SURGERY     RETINAL DETACHMENT SURGERY Left 06/01/2006   ROBOTIC ASSITED PARTIAL NEPHRECTOMY Left 06/03/2022   Procedure: XI ROBOTIC ASSITED PARTIAL NEPHRECTOMY WITH ULTRASOUND AND VENTRAL HERNIA REPAIR;  Surgeon: Alvaro Hummer, MD;  Location: [facility] ORS;  Service: Urology;  Laterality: Left;  3 HRS   THYROIDECTOMY,  PARTIAL Right    THYROIDECTOMY, PARTIAL  06/02/2003   TOTAL KNEE ARTHROPLASTY Right 11/13/2019   Procedure: TOTAL KNEE ARTHROPLASTY SDDC;  Surgeon: Melodi Lerner, MD;  Location: [facility] ORS;  Service: Orthopedics;  Laterality: Right;    Patient Active Problem List   Diagnosis Date Noted   Renal mass 06/03/2022   Open wound of left side of back 01/20/2021   [place] 11/18/2020   Lipoma of back 11/18/2020   Lymphedema 11/18/2020   Melanocytic nevi of right upper limb, including shoulder 11/18/2020   Personal history of malignant melanoma of skin 11/18/2020   Sebaceous cyst 11/18/2020   Seborrheic dermatitis 11/18/2020   Pre-diabetes 04/03/2020   OA (osteoarthritis) of knee 11/13/2019   Total knee replacement status, 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   Pain in right knee 08/03/2019   Fluid collection at surgical site 10/20/2018   Encounter for postoperative examination after surgery for malignant neoplasm 09/30/2018   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/2018   Plantar fasciitis of right foot 02/03/2017   Porokeratosis 02/03/2017   Acute gallstone pancreatitis    Calculus of gallbladder with acute cholecystitis and obstruction    LFT elevation    Stress fracture 01/01/2016   Insomnia 12/16/2015   Peripheral neuropathy 10/18/2015   Renal insufficiency 03/06/2015   HTN (hypertension) 03/06/2015  Osteopenia 03/06/2015   GERD (gastroesophageal reflux disease) 03/06/2015   Vaginal pessary present 03/06/2015   Metatarsalgia 07/13/2012   Increased frequency of urination 04/13/2012   DDD (degenerative disc disease), cervical 01/19/2012   Knee stiffness 01/19/2012   Low back pain 01/19/2012   Neck pain 01/19/2012   Pain of hand 01/19/2012   Hallux valgus 09/20/2011   Hyperkeratosis 08/13/2011   Anxiety disorder 07/06/2011   Depression 07/06/2011   Diverticulosis of large intestine 07/06/2011   Non-toxic multinodular goiter 07/06/2011   Atrophic vaginitis 10/24/2010   Dermatographic  urticaria 09/17/2010   Inflamed seborrheic keratosis 09/17/2010   Closed fracture of fifth metatarsal bone 07/10/2010   Diagnosis unknown 02/27/2010   Hip pain 06/08/2008   Pulmonary nodule seen on imaging study 06/08/2008    PCP: Watt Raisin, MD  REFERRING PROVIDER: Charlie Dolores, MD, [facility]  REFERRING DIAG: degeneration of discs of lumbar spine  THERAPY DIAG:  Chronic bilateral low back pain with right-sided sciatica  Difficulty in walking, not elsewhere classified  Posture imbalance  Chronic bilateral low back pain with left-sided sciatica  Rationale for Evaluation and Treatment: Rehabilitation  ONSET DATE: chronic  SUBJECTIVE:   SUBJECTIVE STATEMENT   I did not do anything but I guess my feet and back just are flared up PERTINENT HISTORY: Saw orthopedist, took prednisone, which didn't help, orthopedist may want to do MRI, referred to PT by both PCP and orthopedist, also B degenerative arthritis hips PAIN:  Are you having pain? 4/10 back and thighs  PRECAUTIONS: Fall  RED FLAGS: None   WEIGHT BEARING RESTRICTIONS: No  FALLS:  Has patient fallen in last 6 months? Yes. Number of falls several  LIVING ENVIRONMENT: Lives with: lives with their family and lives with their daughter Lives in: House/apartment Stairs: Yes: Internal: one flight steps; on left going up Has following equipment at home: Environmental consultant - 4 wheeled  OCCUPATION: retired  PLOF: Independent and Independent with basic ADLs  PATIENT GOALS: reduce pain hips and lower back so I can tolerate more activity  NEXT MD VISIT: not scheduled yet  OBJECTIVE:  Note: Objective measures were completed at Evaluation unless otherwise noted.  DIAGNOSTIC FINDINGS: X-ray done 11/23; IMPRESSION: 1. 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 2. Lower lumbar spondylosis.  PATIENT SURVEYS:  Modified Oswestry Modified Oswestry Low Back Pain Disability Questionnaire: 29 /  50 = 58.0 %   COGNITION: Overall cognitive status: Within functional limits for tasks assessed     SENSATION: Light touch: WFL  EDEMA:  None noted   MUSCLE LENGTH: Hamstrings: Right wfl deg; Left wfl deg Debby test: Right wfl deg; Left wfl deg  POSTURE: scoliotic posture noted concavity L lumbar, pronounced thoracic kyphosis  PALPATION: Not tender with light palpation B gr trochanters hips  LOWER EXTREMITY ROM:B hip ROM wnl, able to achieve tailor position each leg in sitting  LUMBAR ROM: FB limited 15%, also risk of falling pt needed UE support Lumbar ext, 25% with marked side bending to L with this motion SB B 35% B with central lower back pain LOWER EXTREMITY MMT:  MMT Right eval Left eval  Hip flexion    Hip extension    Hip abduction    Hip adduction    Hip internal rotation    Hip external rotation    Knee flexion    Knee extension    Ankle dorsiflexion    Ankle plantarflexion    Ankle inversion    Ankle eversion     (  Blank rows = not tested)   FUNCTIONAL TESTS:  5 times sit to stand: 25 sec, required mod assist of PT to avoid falling backwards into chair with final attempt  GAIT: Distance walked: in clinic up to 60  Assistive device utilized: Single point cane, Walker - 2 wheeled, and none Level of assistance: Min A without device and with cane Comments: very unstable without device                                                                                                                                 TREATMENT DATE:   03/07/24 Nustep L 5 10 min Seated roling ball out for back stretch Seated resisted trunk ext Standing tband shld ext and row 2 sets 10 Standing tband hip ext and abd 2 sets 10  02/29/24 Nustep L 6 10 min Stepping over various heights and directions- HHA with LOB STS with wt ball chest press 10x [employer] carry 7# 1 lap each hand Resisted trunk ext 20 x HHA red tband SL hip flex,ext and abd 10 x Green tband shld ext and  row 2 sets 10     02/22/24 Nustep L 6 10 min Resisted trunk ext 20 x Feet on ball bride, KTC, obl 10 x Iso abdominals 10 x hold 3 sec Green tband clams and hip flex 2 sets 10 Gentle PROM LE and trunk Red tband standing shld ext and row 2 sets 10  02/17/24 Nustep L 6 10 min In // bars foam beam fwd and back tandem 3 x then side stepping 3 x , light HHA STS from standard chair 2 sets 5 cg-min A 6 inch alt step tap 2 sets 10- LOB 2 not clearing foot  20# resisted gait fwd and back 5 x , then 3 x each side CG-min A with 2 LOB Aura carry 6#  02/15/24 Nustep L 6 10 min STS with wt ball chest press 10 x Resisted trunk flex and ext 20 x Seated row 20# 2 sets 10 Knee ext 5# 2 sets 10 HS curl 20# 2 sets 10 Leg press 20# 3 sets 10   02/10/24 Nustep L 6 10 min STS wt ball chest press 10 x Knee ext 5# 2 sets 10 HS curl 20# 2 sets 10 Leg press 20# 3 sets 10 Tband standing shld ext and row 2 sets 10 Tband seated trunk flex and ext  02/08/24 Amb outside 300 feet with curb sup-CGA without AD with cuing to increase stride as she was taking short shuffle steps esp as she fatigued Nustep L 6 10 min STS wt ball CGA 10 x Light HHA vectro taps Light HHA stepping fwd and back over 2 in pole 3# HHA marching fwd and back 20 feet 2 x 3# HHA side stepping 20 feet 2 x  02/03/24 Nustep L 5 10 min BERG   36/56 Ball toss on airex CGA with 2 LOB STS from  elevated mat 2 sets 5 on airex min A Obstacle course stepping on over and around various objects 6 in step taps 10 x each leg then alt CG-min A      02/01/24 Nustep L 5 10 min Amb HHA 5 min outside working on upright posture and cadence Trunk flex and ext 20 x blue tband STS wt ball 10x  HHA on airex stepping on and off fwd,SW and back 10 x each HS curl 25# 2 sets 10 Knee ext 5# 2 sets 10     01/25/24 Nustep L 5 STS wt ball CGA 10 x, cued to not use legs to brace on mat Standing on airex CGA shld ext and row 10 x Standing on  airex HHA alt LE 20 x marching,SL hip flex,ext and abd Standing HHA marching fwd and back 20 feet 2 x each and side stepping 20 feet 2 x each Seated resisted trunk flex and ext 20 x Amb 500 feet HHA working on upright trunk, and cadence and she did very well- 2 brief standing rest breaks   01/18/24 Nustep L 5 Goals assessed and documented Knee ext 5# 2 set s10 HS curl 15# 2 sets 10 Resisted seated trunk ext 2 sets 10 Seated row 20# 10 x  15# 10 x 3# ankle wts HHA marcing fwd and back ward 15 feet 3 x each, side stepping 15 feet 3 x each 3# ankle wt HHA 10 x each SL hip flex,ext and abd  12/30/23 Nustep L 5 Goals assessed and documented STS 5x 16.54 sec Foam beam in // bars 3x side stepping and then tandem fwd and back CGA with light HHA STS with ball toss 10 x Ball toss on airex, only 1 LOB 2 sets 10 Knee ext 5# 2 set s10 HS curl 15# 2 sets 10 [employer] 5# 1 lap each hand Resisted seated trunk ext 2 sets 10 Seated row 15# 2 sets 10     12/13/23 Nustep L 5 8 min  Knee ext 5# 2 set s10 HS curl 15# 2 sets 10 STS with 4# chest press 10x ADD ball squeeze 2x15 Hip abd w/annual resistance 2x15 Alt 4in box taps CGA 2x10  12/10/23 STS with 4# chest press 10x Assessed and documented goals Nustep L 5 Resisted gait 20# 3 x 4 ways- min A Knee ext 5# 2 set s10 HS curl 15# 2 sets 10  12/07/23 Resisted trunk flex and ext 20 x Seated resisted row and shld ext 2 sets 10 Nustep L 5 8 min Green tband HS curl 2 sets 10 3# LAQ 2 sets 10 3# HHA SL hip flex,ext and abd 10 x each- decreased RT LE mvmt ADD ball squeeze 15 x    12/02/23 Nustep L 5 BM for vacuuming to decrease strain on back Green tband HS curl,hip flex and clams seated 2 sets 10 ADD ball squeeze 2 sets 10 3# LAQ 2 sets 10 Red tband seated row and shld ext 2 sets 10 Tband trunk ext 2 sets 10 Standing with RW 3# hip ext and 10 x each      09/20/23:   Evaluation, explained to pt that long  term management of arthritic changes in hips in spine, should respond well to moderate exercise. Practiced gait with st cane, pt without any marked improvement in her stability with the use of the cane.  Therefore practiced with front wheeled walker, marked improvement in gait pattern . Recommended that  she use a front wheeled walker which is more light weight and easier to maneuver in and out of car for outings, she is a high fall risk.  PATIENT EDUCATION:  Education details: POC, goals Person educated: Patient Education method: Explanation, Demonstration, Tactile cues, and Verbal cues Education comprehension: verbalized understanding and returned demonstration  HOME EXERCISE PROGRAM: Green t band hip abd/ER in sitting and green t band ankle pumps L .   ASSESSMENT:  CLINICAL IMPRESSION:    pt arrived with increased pain in back and feet ( neuropathy), stated she did not do anything just a flare up. Pt very concerned with gradaughter wedding in Dec and need to continue PT and get stronger.  OBJECTIVE IMPAIRMENTS: decreased activity tolerance, decreased balance, decreased knowledge of condition, decreased knowledge of use of DME, decreased mobility, difficulty walking, decreased ROM, decreased strength, decreased safety awareness, impaired perceived functional ability, postural dysfunction, and pain.   ACTIVITY LIMITATIONS: carrying, lifting, bending, standing, squatting, transfers, bed mobility, and locomotion level  PARTICIPATION LIMITATIONS: meal prep, cleaning, laundry, shopping, community activity, and yard work  PERSONAL FACTORS: Age, Behavior pattern, Fitness, Past/current experiences, Time since onset of injury/illness/exacerbation, and 1-2 comorbidities: severe DJD B hips, lumbar region, neuropathy are also affecting patient's functional outcome.   REHAB POTENTIAL: Good  CLINICAL DECISION MAKING: Evolving/moderate complexity  EVALUATION COMPLEXITY: Moderate   GOALS: Goals  reviewed with patient? Yes  SHORT TERM GOALS: Target date: 2 weeks 5 5/25 I HEP Baseline: Goal status: MET 10/26/23   LONG TERM GOALS: Target date: 12 weeks, 02/10/24  Modified Oswestry Low Back Pain Disability Questionnaire: 29 / 50 = 58.0 %  Baseline:  Goal status: 11/11/23 progressing  and 11/18/23  11/30/23 progressing 12/10/23 progressing   12/30/23 progressing   01/18/24 progressing 02/10/24 MET  2.  Safe gait with appropriate device , over 350' for safe household distances, without balance loss Baseline:  Goal status: on going 10/26/23  progressing 11/04/23  and 11/11/23  Progressing 11/18/23  progressing 11/30/23 distance met but unsteady  12/10/23  amb without AD 250 feet, no LOB  but instability with lateral sway 12/30/23 progressing 250 feet then slows down and balance decreases 01/18/24 progressing  Progressing and showing good improvements 02/01/24 02/10/24 progressing and 02/17/24  3.  5 x sit to stand 14.8 sec or less without balance loss Baseline: 25 sec,with UE support B  required mod assist on final rep to avoid fall  Goal status: progressing 10/26/23  progressing 11/05/23  11/18/23 multi tries and posterior lean,progressing  11/30/23 LOB progressing  12/10/23 20 sec 12/30/23 progressing   01/18/24 18.5 sec with 1 x bracing knees 02/01/24 progressing uses knees to brace with 02/10/24 16 sec progressing  4. Will increase BERG score to 45 to demonstrate improved balance   Baseline: 36  Goal status: progressing 02/17/24  and 02/29/24    PLAN:  PT FREQUENCY: 2x/week  PT DURATION: 12 weeks  PLANNED INTERVENTIONS: 97110-Therapeutic exercises, 97530- Therapeutic activity, 97112- Neuromuscular re-education, 97535- Self Care, [id number]- Manual therapy, [id number]- Electrical stimulation (unattended), and 97033- Ionotophoresis 4mg /ml Dexamethasone   PLAN FOR NEXT SESSION:  progress func balance and strength    Adriell Polansky,ANGIE, PTA,  03/07/2024, 2:35 PM [facility]  [facility]. [place], [place], [address] Phone: [phone]   Fax:  [phone]

## 2024-03-09 ENCOUNTER — Telehealth: Payer: Self-pay | Admitting: Podiatry

## 2024-03-09 ENCOUNTER — Ambulatory Visit: Admitting: Podiatry

## 2024-03-09 NOTE — Telephone Encounter (Signed)
 Patient called in to get rescheduled. She normally comes in every 3 weeks and your next available is 11/14. What do you suggest?

## 2024-03-10 ENCOUNTER — Ambulatory Visit: Admitting: Physical Therapy

## 2024-03-10 DIAGNOSIS — M5441 Lumbago with sciatica, right side: Secondary | ICD-10-CM | POA: Diagnosis not present

## 2024-03-10 DIAGNOSIS — R293 Abnormal posture: Secondary | ICD-10-CM | POA: Diagnosis not present

## 2024-03-10 DIAGNOSIS — G8929 Other chronic pain: Secondary | ICD-10-CM

## 2024-03-10 DIAGNOSIS — R262 Difficulty in walking, not elsewhere classified: Secondary | ICD-10-CM | POA: Diagnosis not present

## 2024-03-10 DIAGNOSIS — M5442 Lumbago with sciatica, left side: Secondary | ICD-10-CM | POA: Diagnosis not present

## 2024-03-10 NOTE — Therapy (Signed)
 OUTPATIENT PHYSICAL THERAPY LOWER EXTREMITY .       Patient Name: Margaret [name] MRN: [id number] DOB:1941-04-14, 83 y.o., female Today's Date: 03/10/2024  END OF SESSION:  PT End of Session - 03/10/24 1103     Visit Number 28    Date for Recertification  03/30/24    PT Start Time 1100    PT Stop Time 1142    PT Time Calculation (min) 42 min            Past Medical History:  Diagnosis Date   Anxiety    Arthritis    Cancer (HCC)    lt. foot melanoma   Chronic kidney disease    lt. kidney cyst   Coronary artery disease    GERD (gastroesophageal reflux disease)    History of healed stress fracture 2013   bilateral feet   Hx of phlebitis    reports remote hx of superfical blood clots never anticoagulated   Hypertension    Osteopenia    Pneumonia    Past Surgical History:  Procedure Laterality Date   CATARACT EXTRACTION W/ INTRAOCULAR LENS IMPLANT Bilateral    CHOLECYSTECTOMY N/A 09/19/2016   Procedure: LAPAROSCOPIC CHOLECYSTECTOMY POSSIBLE INTRAOPERATIVE CHOLANGIOGRAM;  Surgeon: Donnice Bury, MD;  Location: [facility] OR;  Service: General;  Laterality: N/A;   ERCP N/A 09/18/2016   Procedure: ENDOSCOPIC RETROGRADE CHOLANGIOPANCREATOGRAPHY (ERCP);  Surgeon: Victory LITTIE Legrand DOUGLAS, MD;  Location: [facility] ENDOSCOPY;  Service: Endoscopy;  Laterality: N/A;   FOOT SURGERY     patient reports four foot surgeries   FOOT SURGERY Left    bunion, hammer toe surgery   FOOT SURGERY Right    shaved bunion, hammer toe correction   HERNIA REPAIR     KNEE ARTHROSCOPY Right 06/02/1995   REPLACEMENT TOTAL KNEE Left    REPLACEMENT TOTAL KNEE Left 10/30/2012   RETINAL DETACHMENT SURGERY     RETINAL DETACHMENT SURGERY Left 06/01/2006   ROBOTIC ASSITED PARTIAL NEPHRECTOMY Left 06/03/2022   Procedure: XI ROBOTIC ASSITED PARTIAL NEPHRECTOMY WITH ULTRASOUND AND VENTRAL HERNIA REPAIR;  Surgeon: Alvaro Hummer, MD;  Location: [facility] ORS;  Service: Urology;  Laterality: Left;  3 HRS    THYROIDECTOMY, PARTIAL Right    THYROIDECTOMY, PARTIAL  06/02/2003   TOTAL KNEE ARTHROPLASTY Right 11/13/2019   Procedure: TOTAL KNEE ARTHROPLASTY SDDC;  Surgeon: Melodi Lerner, MD;  Location: [facility] ORS;  Service: Orthopedics;  Laterality: Right;    Patient Active Problem List   Diagnosis Date Noted   Renal mass 06/03/2022   Open wound of left side of back 01/20/2021   [place] 11/18/2020   Lipoma of back 11/18/2020   Lymphedema 11/18/2020   Melanocytic nevi of right upper limb, including shoulder 11/18/2020   Personal history of malignant melanoma of skin 11/18/2020   Sebaceous cyst 11/18/2020   Seborrheic dermatitis 11/18/2020   Pre-diabetes 04/03/2020   OA (osteoarthritis) of knee 11/13/2019   Total knee replacement status, 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   Pain in right knee 08/03/2019   Fluid collection at surgical site 10/20/2018   Encounter for postoperative examination after surgery for malignant neoplasm 09/30/2018   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/2018   Plantar fasciitis of right foot 02/03/2017   Porokeratosis 02/03/2017   Acute gallstone pancreatitis    Calculus of gallbladder with acute cholecystitis and obstruction    LFT elevation    Stress fracture 01/01/2016   Insomnia 12/16/2015   Peripheral neuropathy 10/18/2015   Renal insufficiency 03/06/2015   HTN (hypertension) 03/06/2015  Osteopenia 03/06/2015   GERD (gastroesophageal reflux disease) 03/06/2015   Vaginal pessary present 03/06/2015   Metatarsalgia 07/13/2012   Increased frequency of urination 04/13/2012   DDD (degenerative disc disease), cervical 01/19/2012   Knee stiffness 01/19/2012   Low back pain 01/19/2012   Neck pain 01/19/2012   Pain of hand 01/19/2012   Hallux valgus 09/20/2011   Hyperkeratosis 08/13/2011   Anxiety disorder 07/06/2011   Depression 07/06/2011   Diverticulosis of large intestine 07/06/2011   Non-toxic multinodular goiter 07/06/2011   Atrophic vaginitis 10/24/2010    Dermatographic urticaria 09/17/2010   Inflamed seborrheic keratosis 09/17/2010   Closed fracture of fifth metatarsal bone 07/10/2010   Diagnosis unknown 02/27/2010   Hip pain 06/08/2008   Pulmonary nodule seen on imaging study 06/08/2008    PCP: Watt Raisin, MD  REFERRING PROVIDER: Charlie Dolores, MD, [facility]  REFERRING DIAG: degeneration of discs of lumbar spine  THERAPY DIAG:  Chronic bilateral low back pain with right-sided sciatica  Difficulty in walking, not elsewhere classified  Posture imbalance  Chronic bilateral low back pain with left-sided sciatica  Rationale for Evaluation and Treatment: Rehabilitation  ONSET DATE: chronic  SUBJECTIVE:   SUBJECTIVE STATEMENT     feeling better   PERTINENT HISTORY: Saw orthopedist, took prednisone, which didn't help, orthopedist may want to do MRI, referred to PT by both PCP and orthopedist, also B degenerative arthritis hips PAIN:  Are you having pain? 4/10 back and thighs  PRECAUTIONS: Fall  RED FLAGS: None   WEIGHT BEARING RESTRICTIONS: No  FALLS:  Has patient fallen in last 6 months? Yes. Number of falls several  LIVING ENVIRONMENT: Lives with: lives with their family and lives with their daughter Lives in: House/apartment Stairs: Yes: Internal: one flight steps; on left going up Has following equipment at home: Environmental consultant - 4 wheeled  OCCUPATION: retired  PLOF: Independent and Independent with basic ADLs  PATIENT GOALS: reduce pain hips and lower back so I can tolerate more activity  NEXT MD VISIT: not scheduled yet  OBJECTIVE:  Note: Objective measures were completed at Evaluation unless otherwise noted.  DIAGNOSTIC FINDINGS: X-ray done 11/23; IMPRESSION: 1. 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 2. Lower lumbar spondylosis.  PATIENT SURVEYS:  Modified Oswestry Modified Oswestry Low Back Pain Disability Questionnaire: 29 / 50 = 58.0 %    COGNITION: Overall cognitive status: Within functional limits for tasks assessed     SENSATION: Light touch: WFL  EDEMA:  None noted   MUSCLE LENGTH: Hamstrings: Right wfl deg; Left wfl deg Debby test: Right wfl deg; Left wfl deg  POSTURE: scoliotic posture noted concavity L lumbar, pronounced thoracic kyphosis  PALPATION: Not tender with light palpation B gr trochanters hips  LOWER EXTREMITY ROM:B hip ROM wnl, able to achieve tailor position each leg in sitting  LUMBAR ROM: FB limited 15%, also risk of falling pt needed UE support Lumbar ext, 25% with marked side bending to L with this motion SB B 35% B with central lower back pain LOWER EXTREMITY MMT:  MMT Right eval Left eval  Hip flexion    Hip extension    Hip abduction    Hip adduction    Hip internal rotation    Hip external rotation    Knee flexion    Knee extension    Ankle dorsiflexion    Ankle plantarflexion    Ankle inversion    Ankle eversion     (Blank rows = not tested)  FUNCTIONAL TESTS:  5 times sit to stand: 25 sec, required mod assist of PT to avoid falling backwards into chair with final attempt  GAIT: Distance walked: in clinic up to 60  Assistive device utilized: Single point cane, Walker - 2 wheeled, and none Level of assistance: Min A without device and with cane Comments: very unstable without device                                                                                                                                 TREATMENT DATE:   03/10/24 Nustep L 5 10 min STS wt ball chest press 10 x 7# farmers carry 1 lap each hand Step up with opp leg ext 10 x each with UE Step up laterally 10 x each with UE 30# 3 x fwd and backward Standing tband shld ext and row 2 sets 10 Standing tband hip ext and abd 2 sets 10  03/07/24 Nustep L 5 10 min Seated roling ball out for back stretch Seated resisted trunk ext Standing tband shld ext and row 2 sets 10 Standing tband hip  ext and abd 2 sets 10  02/29/24 Nustep L 6 10 min Stepping over various heights and directions- HHA with LOB STS with wt ball chest press 10x Farmers carry 7# 1 lap each hand Resisted trunk ext 20 x HHA red tband SL hip flex,ext and abd 10 x Green tband shld ext and row 2 sets 10     02/22/24 Nustep L 6 10 min Resisted trunk ext 20 x Feet on ball bride, KTC, obl 10 x Iso abdominals 10 x hold 3 sec Green tband clams and hip flex 2 sets 10 Gentle PROM LE and trunk Red tband standing shld ext and row 2 sets 10  02/17/24 Nustep L 6 10 min In // bars foam beam fwd and back tandem 3 x then side stepping 3 x , light HHA STS from standard chair 2 sets 5 cg-min A 6 inch alt step tap 2 sets 10- LOB 2 not clearing foot  20# resisted gait fwd and back 5 x , then 3 x each side CG-min A with 2 LOB Aura carry 6#  02/15/24 Nustep L 6 10 min STS with wt ball chest press 10 x Resisted trunk flex and ext 20 x Seated row 20# 2 sets 10 Knee ext 5# 2 sets 10 HS curl 20# 2 sets 10 Leg press 20# 3 sets 10   02/10/24 Nustep L 6 10 min STS wt ball chest press 10 x Knee ext 5# 2 sets 10 HS curl 20# 2 sets 10 Leg press 20# 3 sets 10 Tband standing shld ext and row 2 sets 10 Tband seated trunk flex and ext  02/08/24 Amb outside 300 feet with curb sup-CGA without AD with cuing to increase stride as she was taking short shuffle steps esp as she fatigued Nustep L 6 10 min STS wt  ball CGA 10 x Light HHA vectro taps Light HHA stepping fwd and back over 2 in pole 3# HHA marching fwd and back 20 feet 2 x 3# HHA side stepping 20 feet 2 x  02/03/24 Nustep L 5 10 min BERG   36/56 Ball toss on airex CGA with 2 LOB STS from elevated mat 2 sets 5 on airex min A Obstacle course stepping on over and around various objects 6 in step taps 10 x each leg then alt CG-min A      02/01/24 Nustep L 5 10 min Amb HHA 5 min outside working on upright posture and cadence Trunk flex and ext 20 x blue  tband STS wt ball 10x  HHA on airex stepping on and off fwd,SW and back 10 x each HS curl 25# 2 sets 10 Knee ext 5# 2 sets 10     01/25/24 Nustep L 5 STS wt ball CGA 10 x, cued to not use legs to brace on mat Standing on airex CGA shld ext and row 10 x Standing on airex HHA alt LE 20 x marching,SL hip flex,ext and abd Standing HHA marching fwd and back 20 feet 2 x each and side stepping 20 feet 2 x each Seated resisted trunk flex and ext 20 x Amb 500 feet HHA working on upright trunk, and cadence and she did very well- 2 brief standing rest breaks   01/18/24 Nustep L 5 Goals assessed and documented Knee ext 5# 2 set s10 HS curl 15# 2 sets 10 Resisted seated trunk ext 2 sets 10 Seated row 20# 10 x  15# 10 x 3# ankle wts HHA marcing fwd and back ward 15 feet 3 x each, side stepping 15 feet 3 x each 3# ankle wt HHA 10 x each SL hip flex,ext and abd  12/30/23 Nustep L 5 Goals assessed and documented STS 5x 16.54 sec Foam beam in // bars 3x side stepping and then tandem fwd and back CGA with light HHA STS with ball toss 10 x Ball toss on airex, only 1 LOB 2 sets 10 Knee ext 5# 2 set s10 HS curl 15# 2 sets 10 [employer] 5# 1 lap each hand Resisted seated trunk ext 2 sets 10 Seated row 15# 2 sets 10     12/13/23 Nustep L 5 8 min  Knee ext 5# 2 set s10 HS curl 15# 2 sets 10 STS with 4# chest press 10x ADD ball squeeze 2x15 Hip abd w/annual resistance 2x15 Alt 4in box taps CGA 2x10  12/10/23 STS with 4# chest press 10x Assessed and documented goals Nustep L 5 Resisted gait 20# 3 x 4 ways- min A Knee ext 5# 2 set s10 HS curl 15# 2 sets 10  12/07/23 Resisted trunk flex and ext 20 x Seated resisted row and shld ext 2 sets 10 Nustep L 5 8 min Green tband HS curl 2 sets 10 3# LAQ 2 sets 10 3# HHA SL hip flex,ext and abd 10 x each- decreased RT LE mvmt ADD ball squeeze 15 x    12/02/23 Nustep L 5 BM for vacuuming to decrease strain on  back Green tband HS curl,hip flex and clams seated 2 sets 10 ADD ball squeeze 2 sets 10 3# LAQ 2 sets 10 Red tband seated row and shld ext 2 sets 10 Tband trunk ext 2 sets 10 Standing with RW 3# hip ext and 10 x each      09/20/23:  Evaluation, explained to pt that long term management of arthritic changes in hips in spine, should respond well to moderate exercise. Practiced gait with st cane, pt without any marked improvement in her stability with the use of the cane.  Therefore practiced with front wheeled walker, marked improvement in gait pattern . Recommended that she use a front wheeled walker which is more light weight and easier to maneuver in and out of car for outings, she is a high fall risk.  PATIENT EDUCATION:  Education details: POC, goals Person educated: Patient Education method: Explanation, Demonstration, Tactile cues, and Verbal cues Education comprehension: verbalized understanding and returned demonstration  HOME EXERCISE PROGRAM: Green t band hip abd/ER in sitting and green t band ankle pumps L .   ASSESSMENT:  CLINICAL IMPRESSION:    pt arrived walking much better, increased cadence and upright posture. Tolerated increased ex in therapy and felt good about herself. Pt needed cuing for step ups and hesitant with alt step taps.  Much improved STS today. Goals assessed.OBJECTIVE IMPAIRMENTS: decreased activity tolerance, decreased balance, decreased knowledge of condition, decreased knowledge of use of DME, decreased mobility, difficulty walking, decreased ROM, decreased strength, decreased safety awareness, impaired perceived functional ability, postural dysfunction, and pain.   ACTIVITY LIMITATIONS: carrying, lifting, bending, standing, squatting, transfers, bed mobility, and locomotion level  PARTICIPATION LIMITATIONS: meal prep, cleaning, laundry, shopping, community activity, and yard work  PERSONAL FACTORS: Age, Behavior pattern, Fitness, Past/current  experiences, Time since onset of injury/illness/exacerbation, and 1-2 comorbidities: severe DJD B hips, lumbar region, neuropathy are also affecting patient's functional outcome.   REHAB POTENTIAL: Good  CLINICAL DECISION MAKING: Evolving/moderate complexity  EVALUATION COMPLEXITY: Moderate   GOALS: Goals reviewed with patient? Yes  SHORT TERM GOALS: Target date: 2 weeks 5 5/25 I HEP Baseline: Goal status: MET 10/26/23   LONG TERM GOALS: Target date: 12 weeks, 02/10/24  Modified Oswestry Low Back Pain Disability Questionnaire: 29 / 50 = 58.0 %  Baseline:  Goal status: 11/11/23 progressing  and 11/18/23  11/30/23 progressing 12/10/23 progressing   12/30/23 progressing   01/18/24 progressing 02/10/24 MET  2.  Safe gait with appropriate device , over 350' for safe household distances, without balance loss Baseline:  Goal status: on going 10/26/23  progressing 11/04/23  and 11/11/23  Progressing 11/18/23  progressing 11/30/23 distance met but unsteady  12/10/23  amb without AD 250 feet, no LOB  but instability with lateral sway 12/30/23 progressing 250 feet then slows down and balance decreases 01/18/24 progressing  Progressing and showing good improvements 02/01/24 02/10/24 progressing and 02/17/24. Progressing 03/10/24  3.  5 x sit to stand 14.8 sec or less without balance loss Baseline: 25 sec,with UE support B  required mod assist on final rep to avoid fall  Goal status: progressing 10/26/23  progressing 11/05/23  11/18/23 multi tries and posterior lean,progressing  11/30/23 LOB progressing  12/10/23 20 sec 12/30/23 progressing   01/18/24 18.5 sec with 1 x bracing knees 02/01/24 progressing uses knees to brace with 02/10/24 16 sec progressing  03/10/24 15 sec with no LOB or hesitation  4. Will increase BERG score to 45 to demonstrate improved balance   Baseline: 36  Goal status: progressing 02/17/24  and 02/29/24    PLAN:  PT FREQUENCY: 2x/week  PT DURATION: 12 weeks  PLANNED INTERVENTIONS:  97110-Therapeutic exercises, 97530- Therapeutic activity, 97112- Neuromuscular re-education, 97535- Self Care, [id number]- Manual therapy, [id number]- Electrical stimulation (unattended), and 97033- Ionotophoresis 4mg /ml Dexamethasone   PLAN FOR NEXT SESSION:  progress func  balance and strength    Peighton Edgin,ANGIE, PTA,  03/10/2024, 11:04 AM [facility] [address] [place], [place], [address] Phone: [phone]   Fax:  [phone]

## 2024-03-10 NOTE — Telephone Encounter (Signed)
 Patient is now scheduled

## 2024-03-13 ENCOUNTER — Ambulatory Visit (INDEPENDENT_AMBULATORY_CARE_PROVIDER_SITE_OTHER): Admitting: Podiatry

## 2024-03-13 ENCOUNTER — Ambulatory Visit: Admitting: Podiatry

## 2024-03-13 DIAGNOSIS — M2041 Other hammer toe(s) (acquired), right foot: Secondary | ICD-10-CM

## 2024-03-13 DIAGNOSIS — D2372 Other benign neoplasm of skin of left lower limb, including hip: Secondary | ICD-10-CM

## 2024-03-13 DIAGNOSIS — M2042 Other hammer toe(s) (acquired), left foot: Secondary | ICD-10-CM | POA: Diagnosis not present

## 2024-03-13 DIAGNOSIS — G629 Polyneuropathy, unspecified: Secondary | ICD-10-CM | POA: Diagnosis not present

## 2024-03-13 NOTE — Patient Instructions (Signed)
 Keep the bandage on for 24 hours. At that time, remove and clean with soap and water. If it hurts or burns before 24 hours go ahead and remove the bandage and wash with soap and water. Keep the area clean. If there is any blistering cover with antibiotic ointment and a bandage. Monitor for any redness, drainage, or other signs of infection. Call the office if any are to occur. If you have any questions, please call the office at [phone].

## 2024-03-14 ENCOUNTER — Ambulatory Visit: Admitting: Physical Therapy

## 2024-03-14 DIAGNOSIS — M5442 Lumbago with sciatica, left side: Secondary | ICD-10-CM | POA: Diagnosis not present

## 2024-03-14 DIAGNOSIS — R262 Difficulty in walking, not elsewhere classified: Secondary | ICD-10-CM | POA: Diagnosis not present

## 2024-03-14 DIAGNOSIS — M5441 Lumbago with sciatica, right side: Secondary | ICD-10-CM | POA: Diagnosis not present

## 2024-03-14 DIAGNOSIS — G8929 Other chronic pain: Secondary | ICD-10-CM | POA: Diagnosis not present

## 2024-03-14 DIAGNOSIS — R293 Abnormal posture: Secondary | ICD-10-CM | POA: Diagnosis not present

## 2024-03-14 NOTE — Progress Notes (Signed)
 Subjective: No chief complaint on file.   83 year old female presents for follow-up evaluation of foot pain.  She said the callus on the left foot has come back and it does cause discomfort at times.  She denies any swelling or redness or any drainage.  She has concerns about her toes turning.  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  She is asked about splinting these 2 toes.   Objective: AAO x3, NAD DP/PT pulses palpable bilaterally, CRT less than 3 seconds Varicose veins are present with venous insufficiency noted. Sensation decreased with Semmes Weinstein monofilament. Minimal callus formation submetatarsal bilateral without any underlying ulceration, drainage or signs of infection.  This appears to be uniform in color and there is no hyperpigmentation.  There is no pinpoint bleeding or evidence of verruca today.  This appears to be more from scar tissue, residual from the excision of the skin lesion.  On the right foot submetatarsal is minimal hyperkeratotic tissue today.  This is intermittent not there consistently.  On debrided there is no underlying ulceration, drainage or signs of infection.  Uniform color. Digital contractures present bilaterally. No pain with calf compression, swelling, warmth, erythema  Assessment: Skin lesion left foot; digital deformity  Plan:  Left foot - Sharply debrided skin lesion without any complications or bleeding.  Cleaned skin with alcohol.  Salicylic acid was applied followed by bandage.  Postprocedure instructions discussed.  Monitor for any signs or symptoms of infection. - Symptoms persist consider excision, rebiopsy  Neuropathy - Continue gabapentin  - Continue compound cream through [employer]. - Physical therapy for her back which has been helping as well  Digital contractures - I showed her how to splint the toes to help decrease the pressure.   Discussed she is to avoid excess pressure.  Would likely hold off on surgical intervention.  Discussed possible flexor tenotomy of the associated hammertoes if needed  Return in about 3 weeks (around 04/03/2024).  Margaret [name] DPM

## 2024-03-14 NOTE — Therapy (Signed)
 OUTPATIENT PHYSICAL THERAPY LOWER EXTREMITY .       Patient Name: Margaret [name] MRN: [id number] DOB:1940-10-15, 83 y.o., female Today's Date: 03/14/2024  END OF SESSION:  PT End of Session - 03/14/24 1402     PT Start Time 1400    PT Stop Time 1440    PT Time Calculation (min) 40 min            Past Medical History:  Diagnosis Date   Anxiety    Arthritis    Cancer (HCC)    lt. foot melanoma   Chronic kidney disease    lt. kidney cyst   Coronary artery disease    GERD (gastroesophageal reflux disease)    History of healed stress fracture 2013   bilateral feet   Hx of phlebitis    reports remote hx of superfical blood clots never anticoagulated   Hypertension    Osteopenia    Pneumonia    Past Surgical History:  Procedure Laterality Date   CATARACT EXTRACTION W/ INTRAOCULAR LENS IMPLANT Bilateral    CHOLECYSTECTOMY N/A 09/19/2016   Procedure: LAPAROSCOPIC CHOLECYSTECTOMY POSSIBLE INTRAOPERATIVE CHOLANGIOGRAM;  Surgeon: Donnice Bury, MD;  Location: [facility] OR;  Service: General;  Laterality: N/A;   ERCP N/A 09/18/2016   Procedure: ENDOSCOPIC RETROGRADE CHOLANGIOPANCREATOGRAPHY (ERCP);  Surgeon: Victory LITTIE Legrand DOUGLAS, MD;  Location: [facility] ENDOSCOPY;  Service: Endoscopy;  Laterality: N/A;   FOOT SURGERY     patient reports four foot surgeries   FOOT SURGERY Left    bunion, hammer toe surgery   FOOT SURGERY Right    shaved bunion, hammer toe correction   HERNIA REPAIR     KNEE ARTHROSCOPY Right 06/02/1995   REPLACEMENT TOTAL KNEE Left    REPLACEMENT TOTAL KNEE Left 10/30/2012   RETINAL DETACHMENT SURGERY     RETINAL DETACHMENT SURGERY Left 06/01/2006   ROBOTIC ASSITED PARTIAL NEPHRECTOMY Left 06/03/2022   Procedure: XI ROBOTIC ASSITED PARTIAL NEPHRECTOMY WITH ULTRASOUND AND VENTRAL HERNIA REPAIR;  Surgeon: Alvaro Hummer, MD;  Location: [facility] ORS;  Service: Urology;  Laterality: Left;  3 HRS   THYROIDECTOMY, PARTIAL Right    THYROIDECTOMY, PARTIAL  06/02/2003    TOTAL KNEE ARTHROPLASTY Right 11/13/2019   Procedure: TOTAL KNEE ARTHROPLASTY SDDC;  Surgeon: Melodi Lerner, MD;  Location: [facility] ORS;  Service: Orthopedics;  Laterality: Right;    Patient Active Problem List   Diagnosis Date Noted   Renal mass 06/03/2022   Open wound of left side of back 01/20/2021   [place] 11/18/2020   Lipoma of back 11/18/2020   Lymphedema 11/18/2020   Melanocytic nevi of right upper limb, including shoulder 11/18/2020   Personal history of malignant melanoma of skin 11/18/2020   Sebaceous cyst 11/18/2020   Seborrheic dermatitis 11/18/2020   Pre-diabetes 04/03/2020   OA (osteoarthritis) of knee 11/13/2019   Total knee replacement status, 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   Pain in right knee 08/03/2019   Fluid collection at surgical site 10/20/2018   Encounter for postoperative examination after surgery for malignant neoplasm 09/30/2018   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/2018   Plantar fasciitis of right foot 02/03/2017   Porokeratosis 02/03/2017   Acute gallstone pancreatitis    Calculus of gallbladder with acute cholecystitis and obstruction    LFT elevation    Stress fracture 01/01/2016   Insomnia 12/16/2015   Peripheral neuropathy 10/18/2015   Renal insufficiency 03/06/2015   HTN (hypertension) 03/06/2015   Osteopenia 03/06/2015   GERD (gastroesophageal reflux disease) 03/06/2015   Vaginal pessary present  03/06/2015   Metatarsalgia 07/13/2012   Increased frequency of urination 04/13/2012   DDD (degenerative disc disease), cervical 01/19/2012   Knee stiffness 01/19/2012   Low back pain 01/19/2012   Neck pain 01/19/2012   Pain of hand 01/19/2012   Hallux valgus 09/20/2011   Hyperkeratosis 08/13/2011   Anxiety disorder 07/06/2011   Depression 07/06/2011   Diverticulosis of large intestine 07/06/2011   Non-toxic multinodular goiter 07/06/2011   Atrophic vaginitis 10/24/2010   Dermatographic urticaria 09/17/2010   Inflamed seborrheic keratosis  09/17/2010   Closed fracture of fifth metatarsal bone 07/10/2010   Diagnosis unknown 02/27/2010   Hip pain 06/08/2008   Pulmonary nodule seen on imaging study 06/08/2008    PCP: Watt Raisin, MD  REFERRING PROVIDER: Charlie Dolores, MD, [facility]  REFERRING DIAG: degeneration of discs of lumbar spine  THERAPY DIAG:  Chronic bilateral low back pain with right-sided sciatica  Difficulty in walking, not elsewhere classified  Chronic bilateral low back pain with left-sided sciatica  Rationale for Evaluation and Treatment: Rehabilitation  ONSET DATE: chronic  SUBJECTIVE:   SUBJECTIVE STATEMENT stomach cramps after all day but laid down for about 30 min and seems to be better   PERTINENT HISTORY: Saw orthopedist, took prednisone, which didn't help, orthopedist may want to do MRI, referred to PT by both PCP and orthopedist, also B degenerative arthritis hips PAIN:  Are you having pain? 2/10 back  PRECAUTIONS: Fall  RED FLAGS: None   WEIGHT BEARING RESTRICTIONS: No  FALLS:  Has patient fallen in last 6 months? Yes. Number of falls several  LIVING ENVIRONMENT: Lives with: lives with their family and lives with their daughter Lives in: House/apartment Stairs: Yes: Internal: one flight steps; on left going up Has following equipment at home: Environmental consultant - 4 wheeled  OCCUPATION: retired  PLOF: Independent and Independent with basic ADLs  PATIENT GOALS: reduce pain hips and lower back so I can tolerate more activity  NEXT MD VISIT: not scheduled yet  OBJECTIVE:  Note: Objective measures were completed at Evaluation unless otherwise noted.  DIAGNOSTIC FINDINGS: X-ray done 11/23; IMPRESSION: 1. 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 2. Lower lumbar spondylosis.  PATIENT SURVEYS:  Modified Oswestry Modified Oswestry Low Back Pain Disability Questionnaire: 29 / 50 = 58.0 %   COGNITION: Overall cognitive status: Within  functional limits for tasks assessed     SENSATION: Light touch: WFL  EDEMA:  None noted   MUSCLE LENGTH: Hamstrings: Right wfl deg; Left wfl deg Debby test: Right wfl deg; Left wfl deg  POSTURE: scoliotic posture noted concavity L lumbar, pronounced thoracic kyphosis  PALPATION: Not tender with light palpation B gr trochanters hips  LOWER EXTREMITY ROM:B hip ROM wnl, able to achieve tailor position each leg in sitting  LUMBAR ROM: FB limited 15%, also risk of falling pt needed UE support Lumbar ext, 25% with marked side bending to L with this motion SB B 35% B with central lower back pain LOWER EXTREMITY MMT:  MMT Right eval Left eval  Hip flexion    Hip extension    Hip abduction    Hip adduction    Hip internal rotation    Hip external rotation    Knee flexion    Knee extension    Ankle dorsiflexion    Ankle plantarflexion    Ankle inversion    Ankle eversion     (Blank rows = not tested)   FUNCTIONAL TESTS:  5 times sit to  stand: 25 sec, required mod assist of PT to avoid falling backwards into chair with final attempt  GAIT: Distance walked: in clinic up to 60  Assistive device utilized: Single point cane, Walker - 2 wheeled, and none Level of assistance: Min A without device and with cane Comments: very unstable without device                                                                                                                                 TREATMENT DATE:   03/14/24 Nustep L 5 10 min Amb 6 min outside CGA no AD working on cadence and confidence. HHS with step ups on curbs and CGA with step down with encouragement, very fearful. Decreased balance with decline and struggled with incline and increased LBP d/t fwd flex, last 45 sec needed HHA d/t fatigue Cable pulleys 10# standing row 10x then shld ext 10x Seated row 2 sets 10 Lat pull down 2 sets 10 STS with 4# chest press 10 x 4# standing chest press and OH press 10  x   03/10/24 Nustep L 5 10 min STS wt ball chest press 10 x 7# farmers carry 1 lap each hand Step up with opp leg ext 10 x each with UE Step up laterally 10 x each with UE 30# 3 x fwd and backward Standing tband shld ext and row 2 sets 10 Standing tband hip ext and abd 2 sets 10  03/07/24 Nustep L 5 10 min Seated roling ball out for back stretch Seated resisted trunk ext Standing tband shld ext and row 2 sets 10 Standing tband hip ext and abd 2 sets 10  02/29/24 Nustep L 6 10 min Stepping over various heights and directions- HHA with LOB STS with wt ball chest press 10x Farmers carry 7# 1 lap each hand Resisted trunk ext 20 x HHA red tband SL hip flex,ext and abd 10 x Green tband shld ext and row 2 sets 10     02/22/24 Nustep L 6 10 min Resisted trunk ext 20 x Feet on ball bride, KTC, obl 10 x Iso abdominals 10 x hold 3 sec Green tband clams and hip flex 2 sets 10 Gentle PROM LE and trunk Red tband standing shld ext and row 2 sets 10  02/17/24 Nustep L 6 10 min In // bars foam beam fwd and back tandem 3 x then side stepping 3 x , light HHA STS from standard chair 2 sets 5 cg-min A 6 inch alt step tap 2 sets 10- LOB 2 not clearing foot  20# resisted gait fwd and back 5 x , then 3 x each side CG-min A with 2 LOB Aura carry 6#  02/15/24 Nustep L 6 10 min STS with wt ball chest press 10 x Resisted trunk flex and ext 20 x Seated row 20# 2 sets 10 Knee ext 5# 2 sets 10 HS curl 20# 2 sets 10 Leg press  20# 3 sets 10   02/10/24 Nustep L 6 10 min STS wt ball chest press 10 x Knee ext 5# 2 sets 10 HS curl 20# 2 sets 10 Leg press 20# 3 sets 10 Tband standing shld ext and row 2 sets 10 Tband seated trunk flex and ext  02/08/24 Amb outside 300 feet with curb sup-CGA without AD with cuing to increase stride as she was taking short shuffle steps esp as she fatigued Nustep L 6 10 min STS wt ball CGA 10 x Light HHA vectro taps Light HHA stepping fwd and back over 2 in  pole 3# HHA marching fwd and back 20 feet 2 x 3# HHA side stepping 20 feet 2 x  02/03/24 Nustep L 5 10 min BERG   36/56 Ball toss on airex CGA with 2 LOB STS from elevated mat 2 sets 5 on airex min A Obstacle course stepping on over and around various objects 6 in step taps 10 x each leg then alt CG-min A      02/01/24 Nustep L 5 10 min Amb HHA 5 min outside working on upright posture and cadence Trunk flex and ext 20 x blue tband STS wt ball 10x  HHA on airex stepping on and off fwd,SW and back 10 x each HS curl 25# 2 sets 10 Knee ext 5# 2 sets 10     01/25/24 Nustep L 5 STS wt ball CGA 10 x, cued to not use legs to brace on mat Standing on airex CGA shld ext and row 10 x Standing on airex HHA alt LE 20 x marching,SL hip flex,ext and abd Standing HHA marching fwd and back 20 feet 2 x each and side stepping 20 feet 2 x each Seated resisted trunk flex and ext 20 x Amb 500 feet HHA working on upright trunk, and cadence and she did very well- 2 brief standing rest breaks   01/18/24 Nustep L 5 Goals assessed and documented Knee ext 5# 2 set s10 HS curl 15# 2 sets 10 Resisted seated trunk ext 2 sets 10 Seated row 20# 10 x  15# 10 x 3# ankle wts HHA marcing fwd and back ward 15 feet 3 x each, side stepping 15 feet 3 x each 3# ankle wt HHA 10 x each SL hip flex,ext and abd  12/30/23 Nustep L 5 Goals assessed and documented STS 5x 16.54 sec Foam beam in // bars 3x side stepping and then tandem fwd and back CGA with light HHA STS with ball toss 10 x Ball toss on airex, only 1 LOB 2 sets 10 Knee ext 5# 2 set s10 HS curl 15# 2 sets 10 [employer] 5# 1 lap each hand Resisted seated trunk ext 2 sets 10 Seated row 15# 2 sets 10     12/13/23 Nustep L 5 8 min  Knee ext 5# 2 set s10 HS curl 15# 2 sets 10 STS with 4# chest press 10x ADD ball squeeze 2x15 Hip abd w/annual resistance 2x15 Alt 4in box taps CGA 2x10  12/10/23 STS with 4# chest press  10x Assessed and documented goals Nustep L 5 Resisted gait 20# 3 x 4 ways- min A Knee ext 5# 2 set s10 HS curl 15# 2 sets 10  12/07/23 Resisted trunk flex and ext 20 x Seated resisted row and shld ext 2 sets 10 Nustep L 5 8 min Green tband HS curl 2 sets 10 3# LAQ 2 sets 10 3# HHA SL  hip flex,ext and abd 10 x each- decreased RT LE mvmt ADD ball squeeze 15 x    12/02/23 Nustep L 5 BM for vacuuming to decrease strain on back Green tband HS curl,hip flex and clams seated 2 sets 10 ADD ball squeeze 2 sets 10 3# LAQ 2 sets 10 Red tband seated row and shld ext 2 sets 10 Tband trunk ext 2 sets 10 Standing with RW 3# hip ext and 10 x each      09/20/23:   Evaluation, explained to pt that long term management of arthritic changes in hips in spine, should respond well to moderate exercise. Practiced gait with st cane, pt without any marked improvement in her stability with the use of the cane.  Therefore practiced with front wheeled walker, marked improvement in gait pattern . Recommended that she use a front wheeled walker which is more light weight and easier to maneuver in and out of car for outings, she is a high fall risk.  PATIENT EDUCATION:  Education details: POC, goals Person educated: Patient Education method: Explanation, Demonstration, Tactile cues, and Verbal cues Education comprehension: verbalized understanding and returned demonstration  HOME EXERCISE PROGRAM: Green t band hip abd/ER in sitting and green t band ankle pumps L .   ASSESSMENT:  CLINICAL IMPRESSION:    Amb 6 min outside CGA no AD working on cadence and confidence. HHS with step ups on curbs and CGA with step down with encouragement, very fearful. Decreased balance with decline and struggled with incline and increased LBP d/t fwd flex, last 45 sec needed HHA d/t fatigue. Focus on back strengthening ex and added for HEP.     OBJECTIVE IMPAIRMENTS: decreased activity tolerance, decreased  balance, decreased knowledge of condition, decreased knowledge of use of DME, decreased mobility, difficulty walking, decreased ROM, decreased strength, decreased safety awareness, impaired perceived functional ability, postural dysfunction, and pain.   ACTIVITY LIMITATIONS: carrying, lifting, bending, standing, squatting, transfers, bed mobility, and locomotion level  PARTICIPATION LIMITATIONS: meal prep, cleaning, laundry, shopping, community activity, and yard work  PERSONAL FACTORS: Age, Behavior pattern, Fitness, Past/current experiences, Time since onset of injury/illness/exacerbation, and 1-2 comorbidities: severe DJD B hips, lumbar region, neuropathy are also affecting patient's functional outcome.   REHAB POTENTIAL: Good  CLINICAL DECISION MAKING: Evolving/moderate complexity  EVALUATION COMPLEXITY: Moderate   GOALS: Goals reviewed with patient? Yes  SHORT TERM GOALS: Target date: 2 weeks 5 5/25 I HEP Baseline: Goal status: MET 10/26/23   LONG TERM GOALS: Target date: 12 weeks, 02/10/24  Modified Oswestry Low Back Pain Disability Questionnaire: 29 / 50 = 58.0 %  Baseline:  Goal status: 11/11/23 progressing  and 11/18/23  11/30/23 progressing 12/10/23 progressing   12/30/23 progressing   01/18/24 progressing 02/10/24 MET  2.  Safe gait with appropriate device , over 350' for safe household distances, without balance loss Baseline:  Goal status: on going 10/26/23  progressing 11/04/23  and 11/11/23  Progressing 11/18/23  progressing 11/30/23 distance met but unsteady  12/10/23  amb without AD 250 feet, no LOB  but instability with lateral sway 12/30/23 progressing 250 feet then slows down and balance decreases 01/18/24 progressing  Progressing and showing good improvements 02/01/24 02/10/24 progressing and 02/17/24. Progressing 03/10/24  3.  5 x sit to stand 14.8 sec or less without balance loss Baseline: 25 sec,with UE support B  required mod assist on final rep to avoid fall  Goal status:  progressing 10/26/23  progressing 11/05/23  11/18/23 multi tries and posterior lean,progressing  11/30/23 LOB progressing  12/10/23 20 sec 12/30/23 progressing   01/18/24 18.5 sec with 1 x bracing knees 02/01/24 progressing uses knees to brace with 02/10/24 16 sec progressing  03/10/24 15 sec with no LOB or hesitation  4. Will increase BERG score to 45 to demonstrate improved balance   Baseline: 36  Goal status: progressing 02/17/24  and 02/29/24    PLAN:  PT FREQUENCY: 2x/week  PT DURATION: 12 weeks  PLANNED INTERVENTIONS: 97110-Therapeutic exercises, 97530- Therapeutic activity, 97112- Neuromuscular re-education, 97535- Self Care, [id number]- Manual therapy, [id number]- Electrical stimulation (unattended), and 97033- Ionotophoresis 4mg /ml Dexamethasone   PLAN FOR NEXT SESSION:  progress func balance and strength    Calieb Lichtman,ANGIE, PTA,  03/14/2024, 2:06 PM [place] [facility] [address] [facility] [place], [place], [address] Phone: [phone]   Fax:  [phone]

## 2024-03-15 DIAGNOSIS — H04123 Dry eye syndrome of bilateral lacrimal glands: Secondary | ICD-10-CM | POA: Diagnosis not present

## 2024-03-15 DIAGNOSIS — H40023 Open angle with borderline findings, high risk, bilateral: Secondary | ICD-10-CM | POA: Diagnosis not present

## 2024-03-15 DIAGNOSIS — Z83511 Family history of glaucoma: Secondary | ICD-10-CM | POA: Diagnosis not present

## 2024-03-15 DIAGNOSIS — H52223 Regular astigmatism, bilateral: Secondary | ICD-10-CM | POA: Diagnosis not present

## 2024-03-15 DIAGNOSIS — H02052 Trichiasis without entropian right lower eyelid: Secondary | ICD-10-CM | POA: Diagnosis not present

## 2024-03-15 DIAGNOSIS — H26491 Other secondary cataract, right eye: Secondary | ICD-10-CM | POA: Diagnosis not present

## 2024-03-15 DIAGNOSIS — Z961 Presence of intraocular lens: Secondary | ICD-10-CM | POA: Diagnosis not present

## 2024-03-15 DIAGNOSIS — H43813 Vitreous degeneration, bilateral: Secondary | ICD-10-CM | POA: Diagnosis not present

## 2024-03-15 DIAGNOSIS — H35372 Puckering of macula, left eye: Secondary | ICD-10-CM | POA: Diagnosis not present

## 2024-03-16 ENCOUNTER — Ambulatory Visit: Admitting: Physical Therapy

## 2024-03-16 DIAGNOSIS — G8929 Other chronic pain: Secondary | ICD-10-CM | POA: Diagnosis not present

## 2024-03-16 DIAGNOSIS — M5441 Lumbago with sciatica, right side: Secondary | ICD-10-CM | POA: Diagnosis not present

## 2024-03-16 DIAGNOSIS — R262 Difficulty in walking, not elsewhere classified: Secondary | ICD-10-CM | POA: Diagnosis not present

## 2024-03-16 DIAGNOSIS — M5442 Lumbago with sciatica, left side: Secondary | ICD-10-CM | POA: Diagnosis not present

## 2024-03-16 DIAGNOSIS — R293 Abnormal posture: Secondary | ICD-10-CM | POA: Diagnosis not present

## 2024-03-16 NOTE — Therapy (Addendum)
 OUTPATIENT PHYSICAL THERAPY LOWER EXTREMITY .   Progress Note Reporting Period 02/03/24 to 03/16/24  See note below for Objective Data and Assessment of Progress/Goals.        Patient Name: Margaret [name] MRN: [id number] DOB:11/01/1940, 83 y.o., female Today's Date: 03/16/2024  END OF SESSION:  PT End of Session - 03/16/24 1456     Visit Number 30    Date for Recertification  03/30/24    PT Start Time 1450    PT Stop Time 1530    PT Time Calculation (min) 40 min            Past Medical History:  Diagnosis Date   Anxiety    Arthritis    Cancer (HCC)    lt. foot melanoma   Chronic kidney disease    lt. kidney cyst   Coronary artery disease    GERD (gastroesophageal reflux disease)    History of healed stress fracture 2013   bilateral feet   Hx of phlebitis    reports remote hx of superfical blood clots never anticoagulated   Hypertension    Osteopenia    Pneumonia    Past Surgical History:  Procedure Laterality Date   CATARACT EXTRACTION W/ INTRAOCULAR LENS IMPLANT Bilateral    CHOLECYSTECTOMY N/A 09/19/2016   Procedure: LAPAROSCOPIC CHOLECYSTECTOMY POSSIBLE INTRAOPERATIVE CHOLANGIOGRAM;  Surgeon: Donnice Bury, MD;  Location: [facility] OR;  Service: General;  Laterality: N/A;   ERCP N/A 09/18/2016   Procedure: ENDOSCOPIC RETROGRADE CHOLANGIOPANCREATOGRAPHY (ERCP);  Surgeon: Victory LITTIE Legrand DOUGLAS, MD;  Location: [facility] ENDOSCOPY;  Service: Endoscopy;  Laterality: N/A;   FOOT SURGERY     patient reports four foot surgeries   FOOT SURGERY Left    bunion, hammer toe surgery   FOOT SURGERY Right    shaved bunion, hammer toe correction   HERNIA REPAIR     KNEE ARTHROSCOPY Right 06/02/1995   REPLACEMENT TOTAL KNEE Left    REPLACEMENT TOTAL KNEE Left 10/30/2012   RETINAL DETACHMENT SURGERY     RETINAL DETACHMENT SURGERY Left 06/01/2006   ROBOTIC ASSITED PARTIAL NEPHRECTOMY Left 06/03/2022   Procedure: XI ROBOTIC ASSITED PARTIAL NEPHRECTOMY WITH ULTRASOUND AND VENTRAL  HERNIA REPAIR;  Surgeon: Alvaro Hummer, MD;  Location: [facility] ORS;  Service: Urology;  Laterality: Left;  3 HRS   THYROIDECTOMY, PARTIAL Right    THYROIDECTOMY, PARTIAL  06/02/2003   TOTAL KNEE ARTHROPLASTY Right 11/13/2019   Procedure: TOTAL KNEE ARTHROPLASTY SDDC;  Surgeon: Melodi Lerner, MD;  Location: [facility] ORS;  Service: Orthopedics;  Laterality: Right;    Patient Active Problem List   Diagnosis Date Noted   Renal mass 06/03/2022   Open wound of left side of back 01/20/2021   [place] 11/18/2020   Lipoma of back 11/18/2020   Lymphedema 11/18/2020   Melanocytic nevi of right upper limb, including shoulder 11/18/2020   Personal history of malignant melanoma of skin 11/18/2020   Sebaceous cyst 11/18/2020   Seborrheic dermatitis 11/18/2020   Pre-diabetes 04/03/2020   OA (osteoarthritis) of knee 11/13/2019   Total knee replacement status, 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   Pain in right knee 08/03/2019   Fluid collection at surgical site 10/20/2018   Encounter for postoperative examination after surgery for malignant neoplasm 09/30/2018   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/2018   Plantar fasciitis of right foot 02/03/2017   Porokeratosis 02/03/2017   Acute gallstone pancreatitis    Calculus of gallbladder with acute cholecystitis and obstruction    LFT elevation    Stress fracture 01/01/2016  Insomnia 12/16/2015   Peripheral neuropathy 10/18/2015   Renal insufficiency 03/06/2015   HTN (hypertension) 03/06/2015   Osteopenia 03/06/2015   GERD (gastroesophageal reflux disease) 03/06/2015   Vaginal pessary present 03/06/2015   Metatarsalgia 07/13/2012   Increased frequency of urination 04/13/2012   DDD (degenerative disc disease), cervical 01/19/2012   Knee stiffness 01/19/2012   Low back pain 01/19/2012   Neck pain 01/19/2012   Pain of hand 01/19/2012   Hallux valgus 09/20/2011   Hyperkeratosis 08/13/2011   Anxiety disorder 07/06/2011   Depression 07/06/2011   Diverticulosis  of large intestine 07/06/2011   Non-toxic multinodular goiter 07/06/2011   Atrophic vaginitis 10/24/2010   Dermatographic urticaria 09/17/2010   Inflamed seborrheic keratosis 09/17/2010   Closed fracture of fifth metatarsal bone 07/10/2010   Diagnosis unknown 02/27/2010   Hip pain 06/08/2008   Pulmonary nodule seen on imaging study 06/08/2008    PCP: Watt Raisin, MD  REFERRING PROVIDER: Charlie Dolores, MD, [facility]  REFERRING DIAG: degeneration of discs of lumbar spine  THERAPY DIAG:  Chronic bilateral low back pain with right-sided sciatica  Difficulty in walking, not elsewhere classified  Chronic bilateral low back pain with left-sided sciatica  Posture imbalance  Rationale for Evaluation and Treatment: Rehabilitation  ONSET DATE: chronic  SUBJECTIVE:   SUBJECTIVE STATEMENT  neuropathy is bad today   PERTINENT HISTORY: Saw orthopedist, took prednisone, which didn't help, orthopedist may want to do MRI, referred to PT by both PCP and orthopedist, also B degenerative arthritis hips   PAIN:  Are you having pain? 2/10 back  PRECAUTIONS: Fall  RED FLAGS: None   WEIGHT BEARING RESTRICTIONS: No  FALLS:  Has patient fallen in last 6 months? Yes. Number of falls several  LIVING ENVIRONMENT: Lives with: lives with their family and lives with their daughter Lives in: House/apartment Stairs: Yes: Internal: one flight steps; on left going up Has following equipment at home: Environmental consultant - 4 wheeled  OCCUPATION: retired  PLOF: Independent and Independent with basic ADLs  PATIENT GOALS: reduce pain hips and lower back so I can tolerate more activity  NEXT MD VISIT: not scheduled yet  OBJECTIVE:  Note: Objective measures were completed at Evaluation unless otherwise noted.  DIAGNOSTIC FINDINGS: X-ray done 11/23; IMPRESSION: 1. 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 2. Lower lumbar spondylosis.  PATIENT SURVEYS:   Modified Oswestry Modified Oswestry Low Back Pain Disability Questionnaire: 29 / 50 = 58.0 %   COGNITION: Overall cognitive status: Within functional limits for tasks assessed     SENSATION: Light touch: WFL  EDEMA:  None noted   MUSCLE LENGTH: Hamstrings: Right wfl deg; Left wfl deg Debby test: Right wfl deg; Left wfl deg  POSTURE: scoliotic posture noted concavity L lumbar, pronounced thoracic kyphosis  PALPATION: Not tender with light palpation B gr trochanters hips  LOWER EXTREMITY ROM:B hip ROM wnl, able to achieve tailor position each leg in sitting  LUMBAR ROM: FB limited 15%, also risk of falling pt needed UE support Lumbar ext, 25% with marked side bending to L with this motion SB B 35% B with central lower back pain LOWER EXTREMITY MMT:  MMT Right eval Left eval  Hip flexion    Hip extension    Hip abduction    Hip adduction    Hip internal rotation    Hip external rotation    Knee flexion    Knee extension    Ankle dorsiflexion    Ankle plantarflexion  Ankle inversion    Ankle eversion     (Blank rows = not tested)   FUNCTIONAL TESTS:  5 times sit to stand: 25 sec, required mod assist of PT to avoid falling backwards into chair with final attempt  GAIT: Distance walked: in clinic up to 60  Assistive device utilized: Single point cane, Walker - 2 wheeled, and none Level of assistance: Min A without device and with cane Comments: very unstable without device                                                                                                                                 TREATMENT DATE:   03/16/24 Nustep L 5 10 min BERG 42/56 STS 14.4 sec 5 x Foam beam in // bars 5 x tandem fwd and back,laterally Resisted gait 30# 5 x fwd and 5 x backward, 3 x each side- struggled with lateral mvmt  03/14/24 Nustep L 5 10 min Amb 6 min outside CGA no AD working on cadence and confidence. HHS with step ups on curbs and CGA with step down  with encouragement, very fearful. Decreased balance with decline and struggled with incline and increased LBP d/t fwd flex, last 45 sec needed HHA d/t fatigue Cable pulleys 10# standing row 10x then shld ext 10x Seated row 2 sets 10 Lat pull down 2 sets 10 STS with 4# chest press 10 x 4# standing chest press and OH press 10 x   03/10/24 Nustep L 5 10 min STS wt ball chest press 10 x 7# farmers carry 1 lap each hand Step up with opp leg ext 10 x each with UE Step up laterally 10 x each with UE 30# 3 x fwd and backward Standing tband shld ext and row 2 sets 10 Standing tband hip ext and abd 2 sets 10  03/07/24 Nustep L 5 10 min Seated roling ball out for back stretch Seated resisted trunk ext Standing tband shld ext and row 2 sets 10 Standing tband hip ext and abd 2 sets 10  02/29/24 Nustep L 6 10 min Stepping over various heights and directions- HHA with LOB STS with wt ball chest press 10x Farmers carry 7# 1 lap each hand Resisted trunk ext 20 x HHA red tband SL hip flex,ext and abd 10 x Green tband shld ext and row 2 sets 10     02/22/24 Nustep L 6 10 min Resisted trunk ext 20 x Feet on ball bride, KTC, obl 10 x Iso abdominals 10 x hold 3 sec Green tband clams and hip flex 2 sets 10 Gentle PROM LE and trunk Red tband standing shld ext and row 2 sets 10  02/17/24 Nustep L 6 10 min In // bars foam beam fwd and back tandem 3 x then side stepping 3 x , light HHA STS from standard chair 2 sets 5 cg-min A 6 inch alt step tap 2 sets 10- LOB 2  not clearing foot  20# resisted gait fwd and back 5 x , then 3 x each side CG-min A with 2 LOB Aura carry 6#  02/15/24 Nustep L 6 10 min STS with wt ball chest press 10 x Resisted trunk flex and ext 20 x Seated row 20# 2 sets 10 Knee ext 5# 2 sets 10 HS curl 20# 2 sets 10 Leg press 20# 3 sets 10   02/10/24 Nustep L 6 10 min STS wt ball chest press 10 x Knee ext 5# 2 sets 10 HS curl 20# 2 sets 10 Leg press 20# 3 sets  10 Tband standing shld ext and row 2 sets 10 Tband seated trunk flex and ext  02/08/24 Amb outside 300 feet with curb sup-CGA without AD with cuing to increase stride as she was taking short shuffle steps esp as she fatigued Nustep L 6 10 min STS wt ball CGA 10 x Light HHA vectro taps Light HHA stepping fwd and back over 2 in pole 3# HHA marching fwd and back 20 feet 2 x 3# HHA side stepping 20 feet 2 x  02/03/24 Nustep L 5 10 min BERG   36/56 Ball toss on airex CGA with 2 LOB STS from elevated mat 2 sets 5 on airex min A Obstacle course stepping on over and around various objects 6 in step taps 10 x each leg then alt CG-min A      02/01/24 Nustep L 5 10 min Amb HHA 5 min outside working on upright posture and cadence Trunk flex and ext 20 x blue tband STS wt ball 10x  HHA on airex stepping on and off fwd,SW and back 10 x each HS curl 25# 2 sets 10 Knee ext 5# 2 sets 10     01/25/24 Nustep L 5 STS wt ball CGA 10 x, cued to not use legs to brace on mat Standing on airex CGA shld ext and row 10 x Standing on airex HHA alt LE 20 x marching,SL hip flex,ext and abd Standing HHA marching fwd and back 20 feet 2 x each and side stepping 20 feet 2 x each Seated resisted trunk flex and ext 20 x Amb 500 feet HHA working on upright trunk, and cadence and she did very well- 2 brief standing rest breaks   01/18/24 Nustep L 5 Goals assessed and documented Knee ext 5# 2 set s10 HS curl 15# 2 sets 10 Resisted seated trunk ext 2 sets 10 Seated row 20# 10 x  15# 10 x 3# ankle wts HHA marcing fwd and back ward 15 feet 3 x each, side stepping 15 feet 3 x each 3# ankle wt HHA 10 x each SL hip flex,ext and abd  12/30/23 Nustep L 5 Goals assessed and documented STS 5x 16.54 sec Foam beam in // bars 3x side stepping and then tandem fwd and back CGA with light HHA STS with ball toss 10 x Ball toss on airex, only 1 LOB 2 sets 10 Knee ext 5# 2 set s10 HS curl 15# 2 sets  10 [employer] 5# 1 lap each hand Resisted seated trunk ext 2 sets 10 Seated row 15# 2 sets 10     12/13/23 Nustep L 5 8 min  Knee ext 5# 2 set s10 HS curl 15# 2 sets 10 STS with 4# chest press 10x ADD ball squeeze 2x15 Hip abd w/annual resistance 2x15 Alt 4in box taps CGA 2x10  12/10/23 STS with 4# chest press  10x Assessed and documented goals Nustep L 5 Resisted gait 20# 3 x 4 ways- min A Knee ext 5# 2 set s10 HS curl 15# 2 sets 10  12/07/23 Resisted trunk flex and ext 20 x Seated resisted row and shld ext 2 sets 10 Nustep L 5 8 min Green tband HS curl 2 sets 10 3# LAQ 2 sets 10 3# HHA SL hip flex,ext and abd 10 x each- decreased RT LE mvmt ADD ball squeeze 15 x    12/02/23 Nustep L 5 BM for vacuuming to decrease strain on back Green tband HS curl,hip flex and clams seated 2 sets 10 ADD ball squeeze 2 sets 10 3# LAQ 2 sets 10 Red tband seated row and shld ext 2 sets 10 Tband trunk ext 2 sets 10 Standing with RW 3# hip ext and 10 x each      09/20/23:   Evaluation, explained to pt that long term management of arthritic changes in hips in spine, should respond well to moderate exercise. Practiced gait with st cane, pt without any marked improvement in her stability with the use of the cane.  Therefore practiced with front wheeled walker, marked improvement in gait pattern . Recommended that she use a front wheeled walker which is more light weight and easier to maneuver in and out of car for outings, she is a high fall risk.  PATIENT EDUCATION:  Education details: POC, goals Person educated: Patient Education method: Explanation, Demonstration, Tactile cues, and Verbal cues Education comprehension: verbalized understanding and returned demonstration  HOME EXERCISE PROGRAM: Green t band hip abd/ER in sitting and green t band ankle pumps L .   ASSESSMENT:  CLINICAL IMPRESSION:    Amb 6 min outside CGA no AD working on cadence and confidence. HHS with  step ups on curbs and CGA with step down with encouragement, very fearful. Decreased balance with decline and struggled with incline and increased LBP d/t fwd flex, last 45 sec needed HHA d/t fatigue. BERG increase from 36 to 42 so getting closer to decreasing fall risk and STS goal met.     OBJECTIVE IMPAIRMENTS: decreased activity tolerance, decreased balance, decreased knowledge of condition, decreased knowledge of use of DME, decreased mobility, difficulty walking, decreased ROM, decreased strength, decreased safety awareness, impaired perceived functional ability, postural dysfunction, and pain.   ACTIVITY LIMITATIONS: carrying, lifting, bending, standing, squatting, transfers, bed mobility, and locomotion level  PARTICIPATION LIMITATIONS: meal prep, cleaning, laundry, shopping, community activity, and yard work  PERSONAL FACTORS: Age, Behavior pattern, Fitness, Past/current experiences, Time since onset of injury/illness/exacerbation, and 1-2 comorbidities: severe DJD B hips, lumbar region, neuropathy are also affecting patient's functional outcome.   REHAB POTENTIAL: Good  CLINICAL DECISION MAKING: Evolving/moderate complexity  EVALUATION COMPLEXITY: Moderate   GOALS: Goals reviewed with patient? Yes  SHORT TERM GOALS: Target date: 2 weeks 5 5/25 I HEP Baseline: Goal status: MET 10/26/23   LONG TERM GOALS: Target date:   Modified Oswestry Low Back Pain Disability Questionnaire: 29 / 50 = 58.0 %  Baseline:  Goal status: 11/11/23 progressing  and 11/18/23  11/30/23 progressing 12/10/23 progressing   12/30/23 progressing   01/18/24 progressing 02/10/24 MET  2.  Safe gait with appropriate device , over 350' for safe household distances, without balance loss Baseline:  Goal status: on going 10/26/23  progressing 11/04/23  and 11/11/23  Progressing 11/18/23  progressing 11/30/23 distance met but unsteady  12/10/23  amb without AD 250 feet, no LOB  but instability with  lateral sway 12/30/23  progressing 250 feet then slows down and balance decreases 01/18/24 progressing  Progressing and showing good improvements 02/01/24 02/10/24 progressing and 02/17/24. Progressing 03/10/24  03/16/24 met for distance but still moments of unsteadiness  3.  5 x sit to stand 14.8 sec or less without balance loss Baseline: 25 sec,with UE support B  required mod assist on final rep to avoid fall  Goal status: progressing 10/26/23  progressing 11/05/23  11/18/23 multi tries and posterior lean,progressing  11/30/23 LOB progressing  12/10/23 20 sec 12/30/23 progressing   01/18/24 18.5 sec with 1 x bracing knees 02/01/24 progressing uses knees to brace with 02/10/24 16 sec progressing  03/10/24 15 sec with no LOB or hesitation 03/16/24 14.4 sec MET  4. Will increase BERG score to 45 to demonstrate improved balance   Baseline: 36  Goal status: progressing 02/17/24  and 02/29/24 03/16/24 42/56 progressing    PLAN:  PT FREQUENCY: 2x/week  PT DURATION: 12 weeks  PLANNED INTERVENTIONS: 97110-Therapeutic exercises, 97530- Therapeutic activity, 97112- Neuromuscular re-education, 97535- Self Care, [id number]- Manual therapy, [id number]- Electrical stimulation (unattended), and 97033- Ionotophoresis 4mg /ml Dexamethasone   PLAN FOR NEXT SESSION:  progress func balance and strength    Laddie Naeem,ANGIE, PTA,  03/16/2024, 2:58 PM [place] [facility] [address] [facility] [place], [place], [address] Phone: [phone]   Fax:  [phone]

## 2024-03-21 ENCOUNTER — Ambulatory Visit: Admitting: Physical Therapy

## 2024-03-21 DIAGNOSIS — M5441 Lumbago with sciatica, right side: Secondary | ICD-10-CM | POA: Diagnosis not present

## 2024-03-21 DIAGNOSIS — R262 Difficulty in walking, not elsewhere classified: Secondary | ICD-10-CM

## 2024-03-21 DIAGNOSIS — R293 Abnormal posture: Secondary | ICD-10-CM | POA: Diagnosis not present

## 2024-03-21 DIAGNOSIS — G8929 Other chronic pain: Secondary | ICD-10-CM | POA: Diagnosis not present

## 2024-03-21 DIAGNOSIS — M5442 Lumbago with sciatica, left side: Secondary | ICD-10-CM | POA: Diagnosis not present

## 2024-03-21 NOTE — Therapy (Signed)
 OUTPATIENT PHYSICAL THERAPY LOWER EXTREMITY .         Patient Name: QUIDA GLASSER MRN: [id number] DOB:[birth year], 83 y.o., female Today's Date: 03/21/2024  END OF SESSION:  PT End of Session - 03/21/24 1535     Visit Number 31    Date for Recertification  03/30/24    PT Start Time 1530    PT Stop Time 1615    PT Time Calculation (min) 45 min            Past Medical History:  Diagnosis Date   Anxiety    Arthritis    Cancer (HCC)    lt. foot melanoma   Chronic kidney disease    lt. kidney cyst   Coronary artery disease    GERD (gastroesophageal reflux disease)    History of healed stress fracture 2013   bilateral feet   Hx of phlebitis    reports remote hx of superfical blood clots never anticoagulated   Hypertension    Osteopenia    Pneumonia    Past Surgical History:  Procedure Laterality Date   CATARACT EXTRACTION W/ INTRAOCULAR LENS IMPLANT Bilateral    CHOLECYSTECTOMY N/A 09/19/2016   Procedure: LAPAROSCOPIC CHOLECYSTECTOMY POSSIBLE INTRAOPERATIVE CHOLANGIOGRAM;  Surgeon: Donnice Bury, MD;  Location: [facility] OR;  Service: General;  Laterality: N/A;   ERCP N/A 09/18/2016   Procedure: ENDOSCOPIC RETROGRADE CHOLANGIOPANCREATOGRAPHY (ERCP);  Surgeon: Victory LITTIE Legrand DOUGLAS, MD;  Location: [facility] ENDOSCOPY;  Service: Endoscopy;  Laterality: N/A;   FOOT SURGERY     patient reports four foot surgeries   FOOT SURGERY Left    bunion, hammer toe surgery   FOOT SURGERY Right    shaved bunion, hammer toe correction   HERNIA REPAIR     KNEE ARTHROSCOPY Right 06/02/1995   REPLACEMENT TOTAL KNEE Left    REPLACEMENT TOTAL KNEE Left 10/30/2012   RETINAL DETACHMENT SURGERY     RETINAL DETACHMENT SURGERY Left 06/01/2006   ROBOTIC ASSITED PARTIAL NEPHRECTOMY Left 06/03/2022   Procedure: XI ROBOTIC ASSITED PARTIAL NEPHRECTOMY WITH ULTRASOUND AND VENTRAL HERNIA REPAIR;  Surgeon: Alvaro Hummer, MD;  Location: [facility] ORS;  Service: Urology;  Laterality: Left;  3 HRS    THYROIDECTOMY, PARTIAL Right    THYROIDECTOMY, PARTIAL  06/02/2003   TOTAL KNEE ARTHROPLASTY Right 11/13/2019   Procedure: TOTAL KNEE ARTHROPLASTY SDDC;  Surgeon: Melodi Lerner, MD;  Location: [facility] ORS;  Service: Orthopedics;  Laterality: Right;    Patient Active Problem List   Diagnosis Date Noted   Renal mass 06/03/2022   Open wound of left side of back 01/20/2021   [place] 11/18/2020   Lipoma of back 11/18/2020   Lymphedema 11/18/2020   Melanocytic nevi of right upper limb, including shoulder 11/18/2020   Personal history of malignant melanoma of skin 11/18/2020   Sebaceous cyst 11/18/2020   Seborrheic dermatitis 11/18/2020   Pre-diabetes 04/03/2020   OA (osteoarthritis) of knee 11/13/2019   Total knee replacement status, 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   Pain in right knee 08/03/2019   Fluid collection at surgical site 10/20/2018   Encounter for postoperative examination after surgery for malignant neoplasm 09/30/2018   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/2018   Plantar fasciitis of right foot 02/03/2017   Porokeratosis 02/03/2017   Acute gallstone pancreatitis    Calculus of gallbladder with acute cholecystitis and obstruction    LFT elevation    Stress fracture 01/01/2016   Insomnia 12/16/2015   Peripheral neuropathy 10/18/2015   Renal insufficiency 03/06/2015   HTN (hypertension) 03/06/2015  Osteopenia 03/06/2015   GERD (gastroesophageal reflux disease) 03/06/2015   Vaginal pessary present 03/06/2015   Metatarsalgia 07/13/2012   Increased frequency of urination 04/13/2012   DDD (degenerative disc disease), cervical 01/19/2012   Knee stiffness 01/19/2012   Low back pain 01/19/2012   Neck pain 01/19/2012   Pain of hand 01/19/2012   Hallux valgus 09/20/2011   Hyperkeratosis 08/13/2011   Anxiety disorder 07/06/2011   Depression 07/06/2011   Diverticulosis of large intestine 07/06/2011   Non-toxic multinodular goiter 07/06/2011   Atrophic vaginitis 10/24/2010    Dermatographic urticaria 09/17/2010   Inflamed seborrheic keratosis 09/17/2010   Closed fracture of fifth metatarsal bone 07/10/2010   Diagnosis unknown 02/27/2010   Hip pain 06/08/2008   Pulmonary nodule seen on imaging study 06/08/2008    PCP: Watt Raisin, MD  REFERRING PROVIDER: Charlie Dolores, MD, [facility]  REFERRING DIAG: degeneration of discs of lumbar spine  THERAPY DIAG:  Chronic bilateral low back pain with right-sided sciatica  Difficulty in walking, not elsewhere classified  Chronic bilateral low back pain with left-sided sciatica  Posture imbalance  Rationale for Evaluation and Treatment: Rehabilitation  ONSET DATE: chronic  SUBJECTIVE:   SUBJECTIVE STATEMENT   been busy today.   PERTINENT HISTORY: Saw orthopedist, took prednisone, which didn't help, orthopedist may want to do MRI, referred to PT by both PCP and orthopedist, also B degenerative arthritis hips   PAIN:  Are you having pain? 2/10 back  PRECAUTIONS: Fall  RED FLAGS: None   WEIGHT BEARING RESTRICTIONS: No  FALLS:  Has patient fallen in last 6 months? Yes. Number of falls several  LIVING ENVIRONMENT: Lives with: lives with their family and lives with their daughter Lives in: House/apartment Stairs: Yes: Internal: one flight steps; on left going up Has following equipment at home: Environmental consultant - 4 wheeled  OCCUPATION: retired  PLOF: Independent and Independent with basic ADLs  PATIENT GOALS: reduce pain hips and lower back so I can tolerate more activity  NEXT MD VISIT: not scheduled yet  OBJECTIVE:  Note: Objective measures were completed at Evaluation unless otherwise noted.  DIAGNOSTIC FINDINGS: X-ray done 11/23; IMPRESSION: 1. 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 2. Lower lumbar spondylosis.  PATIENT SURVEYS:  Modified Oswestry Modified Oswestry Low Back Pain Disability Questionnaire: 29 / 50 = 58.0 %   COGNITION: Overall  cognitive status: Within functional limits for tasks assessed     SENSATION: Light touch: WFL  EDEMA:  None noted   MUSCLE LENGTH: Hamstrings: Right wfl deg; Left wfl deg Debby test: Right wfl deg; Left wfl deg  POSTURE: scoliotic posture noted concavity L lumbar, pronounced thoracic kyphosis  PALPATION: Not tender with light palpation B gr trochanters hips  LOWER EXTREMITY ROM:B hip ROM wnl, able to achieve tailor position each leg in sitting  LUMBAR ROM: FB limited 15%, also risk of falling pt needed UE support Lumbar ext, 25% with marked side bending to L with this motion SB B 35% B with central lower back pain LOWER EXTREMITY MMT:  MMT Right eval Left eval  Hip flexion    Hip extension    Hip abduction    Hip adduction    Hip internal rotation    Hip external rotation    Knee flexion    Knee extension    Ankle dorsiflexion    Ankle plantarflexion    Ankle inversion    Ankle eversion     (Blank rows = not tested)  FUNCTIONAL TESTS:  5 times sit to stand: 25 sec, required mod assist of PT to avoid falling backwards into chair with final attempt  GAIT: Distance walked: in clinic up to 60  Assistive device utilized: Single point cane, Walker - 2 wheeled, and none Level of assistance: Min A without device and with cane Comments: very unstable without device                                                                                                                                 TREATMENT DATE:   03/21/24 Walk outside 4 min working on cadence and stride. 1 standing rest break Resisted gait 4 way 5 x each 20# and did without LOB at lower resistance More square stepping with HHA.more fear of stepping and falling Nustep L 5 10 min STS with wt ball chest press Standing on airex ball toss CGA  03/16/24 Nustep L 5 10 min BERG 42/56 STS 14.4 sec 5 x Foam beam in // bars 5 x tandem fwd and back,laterally Resisted gait 30# 5 x fwd and 5 x backward, 3 x  each side- struggled with lateral mvmt  03/14/24 Nustep L 5 10 min Amb 6 min outside CGA no AD working on cadence and confidence. HHS with step ups on curbs and CGA with step down with encouragement, very fearful. Decreased balance with decline and struggled with incline and increased LBP d/t fwd flex, last 45 sec needed HHA d/t fatigue Cable pulleys 10# standing row 10x then shld ext 10x Seated row 2 sets 10 Lat pull down 2 sets 10 STS with 4# chest press 10 x 4# standing chest press and OH press 10 x   03/10/24 Nustep L 5 10 min STS wt ball chest press 10 x 7# farmers carry 1 lap each hand Step up with opp leg ext 10 x each with UE Step up laterally 10 x each with UE 30# 3 x fwd and backward Standing tband shld ext and row 2 sets 10 Standing tband hip ext and abd 2 sets 10  03/07/24 Nustep L 5 10 min Seated roling ball out for back stretch Seated resisted trunk ext Standing tband shld ext and row 2 sets 10 Standing tband hip ext and abd 2 sets 10  02/29/24 Nustep L 6 10 min Stepping over various heights and directions- HHA with LOB STS with wt ball chest press 10x Farmers carry 7# 1 lap each hand Resisted trunk ext 20 x HHA red tband SL hip flex,ext and abd 10 x Green tband shld ext and row 2 sets 10     02/22/24 Nustep L 6 10 min Resisted trunk ext 20 x Feet on ball bride, KTC, obl 10 x Iso abdominals 10 x hold 3 sec Green tband clams and hip flex 2 sets 10 Gentle PROM LE and trunk Red tband standing shld ext and row 2 sets 10  02/17/24 Nustep L 6 10  min In // bars foam beam fwd and back tandem 3 x then side stepping 3 x , light HHA STS from standard chair 2 sets 5 cg-min A 6 inch alt step tap 2 sets 10- LOB 2 not clearing foot  20# resisted gait fwd and back 5 x , then 3 x each side CG-min A with 2 LOB Aura carry 6#  02/15/24 Nustep L 6 10 min STS with wt ball chest press 10 x Resisted trunk flex and ext 20 x Seated row 20# 2 sets 10 Knee ext 5# 2 sets  10 HS curl 20# 2 sets 10 Leg press 20# 3 sets 10   02/10/24 Nustep L 6 10 min STS wt ball chest press 10 x Knee ext 5# 2 sets 10 HS curl 20# 2 sets 10 Leg press 20# 3 sets 10 Tband standing shld ext and row 2 sets 10 Tband seated trunk flex and ext  02/08/24 Amb outside 300 feet with curb sup-CGA without AD with cuing to increase stride as she was taking short shuffle steps esp as she fatigued Nustep L 6 10 min STS wt ball CGA 10 x Light HHA vectro taps Light HHA stepping fwd and back over 2 in pole 3# HHA marching fwd and back 20 feet 2 x 3# HHA side stepping 20 feet 2 x  02/03/24 Nustep L 5 10 min BERG   36/56 Ball toss on airex CGA with 2 LOB STS from elevated mat 2 sets 5 on airex min A Obstacle course stepping on over and around various objects 6 in step taps 10 x each leg then alt CG-min A      02/01/24 Nustep L 5 10 min Amb HHA 5 min outside working on upright posture and cadence Trunk flex and ext 20 x blue tband STS wt ball 10x  HHA on airex stepping on and off fwd,SW and back 10 x each HS curl 25# 2 sets 10 Knee ext 5# 2 sets 10     01/25/24 Nustep L 5 STS wt ball CGA 10 x, cued to not use legs to brace on mat Standing on airex CGA shld ext and row 10 x Standing on airex HHA alt LE 20 x marching,SL hip flex,ext and abd Standing HHA marching fwd and back 20 feet 2 x each and side stepping 20 feet 2 x each Seated resisted trunk flex and ext 20 x Amb 500 feet HHA working on upright trunk, and cadence and she did very well- 2 brief standing rest breaks   01/18/24 Nustep L 5 Goals assessed and documented Knee ext 5# 2 set s10 HS curl 15# 2 sets 10 Resisted seated trunk ext 2 sets 10 Seated row 20# 10 x  15# 10 x 3# ankle wts HHA marcing fwd and back ward 15 feet 3 x each, side stepping 15 feet 3 x each 3# ankle wt HHA 10 x each SL hip flex,ext and abd  12/30/23 Nustep L 5 Goals assessed and documented STS 5x 16.54 sec Foam beam in //  bars 3x side stepping and then tandem fwd and back CGA with light HHA STS with ball toss 10 x Ball toss on airex, only 1 LOB 2 sets 10 Knee ext 5# 2 set s10 HS curl 15# 2 sets 10 [employer] 5# 1 lap each hand Resisted seated trunk ext 2 sets 10 Seated row 15# 2 sets 10     12/13/23 Nustep L 5 8 min  Knee  ext 5# 2 set s10 HS curl 15# 2 sets 10 STS with 4# chest press 10x ADD ball squeeze 2x15 Hip abd w/annual resistance 2x15 Alt 4in box taps CGA 2x10  12/10/23 STS with 4# chest press 10x Assessed and documented goals Nustep L 5 Resisted gait 20# 3 x 4 ways- min A Knee ext 5# 2 set s10 HS curl 15# 2 sets 10  12/07/23 Resisted trunk flex and ext 20 x Seated resisted row and shld ext 2 sets 10 Nustep L 5 8 min Green tband HS curl 2 sets 10 3# LAQ 2 sets 10 3# HHA SL hip flex,ext and abd 10 x each- decreased RT LE mvmt ADD ball squeeze 15 x    12/02/23 Nustep L 5 BM for vacuuming to decrease strain on back Green tband HS curl,hip flex and clams seated 2 sets 10 ADD ball squeeze 2 sets 10 3# LAQ 2 sets 10 Red tband seated row and shld ext 2 sets 10 Tband trunk ext 2 sets 10 Standing with RW 3# hip ext and 10 x each      09/20/23:   Evaluation, explained to pt that long term management of arthritic changes in hips in spine, should respond well to moderate exercise. Practiced gait with st cane, pt without any marked improvement in her stability with the use of the cane.  Therefore practiced with front wheeled walker, marked improvement in gait pattern . Recommended that she use a front wheeled walker which is more light weight and easier to maneuver in and out of car for outings, she is a high fall risk.  PATIENT EDUCATION:  Education details: POC, goals Person educated: Patient Education method: Explanation, Demonstration, Tactile cues, and Verbal cues Education comprehension: verbalized understanding and returned demonstration  HOME EXERCISE  PROGRAM: Green t band hip abd/ER in sitting and green t band ankle pumps L .   ASSESSMENT:  CLINICAL IMPRESSION:    Amb 4 min outside CGA no AD working on cadence and confidence. HHS with step ups on curbs , much better ability to maintain upright trunk but at 4 min mark increased LBP. Worked on more stepping stepping over obstacle and was very fearful of falling and required light HHA and cuing. Resisted gait with less wt and did without LOB.  OBJECTIVE IMPAIRMENTS: decreased activity tolerance, decreased balance, decreased knowledge of condition, decreased knowledge of use of DME, decreased mobility, difficulty walking, decreased ROM, decreased strength, decreased safety awareness, impaired perceived functional ability, postural dysfunction, and pain.   ACTIVITY LIMITATIONS: carrying, lifting, bending, standing, squatting, transfers, bed mobility, and locomotion level  PARTICIPATION LIMITATIONS: meal prep, cleaning, laundry, shopping, community activity, and yard work  PERSONAL FACTORS: Age, Behavior pattern, Fitness, Past/current experiences, Time since onset of injury/illness/exacerbation, and 1-2 comorbidities: severe DJD B hips, lumbar region, neuropathy are also affecting patient's functional outcome.   REHAB POTENTIAL: Good  CLINICAL DECISION MAKING: Evolving/moderate complexity  EVALUATION COMPLEXITY: Moderate   GOALS: Goals reviewed with patient? Yes  SHORT TERM GOALS: Target date: 2 weeks 5 5/25 I HEP Baseline: Goal status: MET 10/26/23   LONG TERM GOALS: Target date:   Modified Oswestry Low Back Pain Disability Questionnaire: 29 / 50 = 58.0 %  Baseline:  Goal status: 11/11/23 progressing  and 11/18/23  11/30/23 progressing 12/10/23 progressing   12/30/23 progressing   01/18/24 progressing 02/10/24 MET  2.  Safe gait with appropriate device , over 350' for safe household distances, without balance loss Baseline:  Goal status: on  going 10/26/23  progressing 11/04/23  and  11/11/23  Progressing 11/18/23  progressing 11/30/23 distance met but unsteady  12/10/23  amb without AD 250 feet, no LOB  but instability with lateral sway 12/30/23 progressing 250 feet then slows down and balance decreases 01/18/24 progressing  Progressing and showing good improvements 02/01/24 02/10/24 progressing and 02/17/24. Progressing 03/10/24  03/16/24 met for distance but still moments of unsteadiness  3.  5 x sit to stand 14.8 sec or less without balance loss Baseline: 25 sec,with UE support B  required mod assist on final rep to avoid fall  Goal status: progressing 10/26/23  progressing 11/05/23  11/18/23 multi tries and posterior lean,progressing  11/30/23 LOB progressing  12/10/23 20 sec 12/30/23 progressing   01/18/24 18.5 sec with 1 x bracing knees 02/01/24 progressing uses knees to brace with 02/10/24 16 sec progressing  03/10/24 15 sec with no LOB or hesitation 03/16/24 14.4 sec MET  4. Will increase BERG score to 45 to demonstrate improved balance   Baseline: 36  Goal status: progressing 02/17/24  and 02/29/24 03/16/24 42/56 progressing    PLAN:  PT FREQUENCY: 2x/week  PT DURATION: 12 weeks  PLANNED INTERVENTIONS: 97110-Therapeutic exercises, 97530- Therapeutic activity, 97112- Neuromuscular re-education, 97535- Self Care, [id number]- Manual therapy, [id number]- Electrical stimulation (unattended), and 97033- Ionotophoresis 4mg /ml Dexamethasone   PLAN FOR NEXT SESSION:  progress func balance and strength    Marv Alfrey,ANGIE, PTA,  03/21/2024, 3:36 PM [facility] [address] [facility] [place], [place], [address] Phone: [phone]   Fax:  [phone]

## 2024-03-23 ENCOUNTER — Ambulatory Visit: Admitting: Physical Therapy

## 2024-03-23 DIAGNOSIS — M5442 Lumbago with sciatica, left side: Secondary | ICD-10-CM | POA: Diagnosis not present

## 2024-03-23 DIAGNOSIS — R262 Difficulty in walking, not elsewhere classified: Secondary | ICD-10-CM

## 2024-03-23 DIAGNOSIS — R293 Abnormal posture: Secondary | ICD-10-CM | POA: Diagnosis not present

## 2024-03-23 DIAGNOSIS — G8929 Other chronic pain: Secondary | ICD-10-CM | POA: Diagnosis not present

## 2024-03-23 DIAGNOSIS — M5441 Lumbago with sciatica, right side: Secondary | ICD-10-CM | POA: Diagnosis not present

## 2024-03-23 NOTE — Therapy (Signed)
 OUTPATIENT PHYSICAL THERAPY LOWER EXTREMITY .         Patient Name: Margaret [name] MRN: [id number] DOB:1940/11/29, 83 y.o., female Today's Date: 03/23/2024  END OF SESSION:  PT End of Session - 03/23/24 1451     Visit Number 32    Date for Recertification  03/30/24    PT Start Time 1445    PT Stop Time 1530    PT Time Calculation (min) 45 min            Past Medical History:  Diagnosis Date   Anxiety    Arthritis    Cancer (HCC)    lt. foot melanoma   Chronic kidney disease    lt. kidney cyst   Coronary artery disease    GERD (gastroesophageal reflux disease)    History of healed stress fracture 2013   bilateral feet   Hx of phlebitis    reports remote hx of superfical blood clots never anticoagulated   Hypertension    Osteopenia    Pneumonia    Past Surgical History:  Procedure Laterality Date   CATARACT EXTRACTION W/ INTRAOCULAR LENS IMPLANT Bilateral    CHOLECYSTECTOMY N/A 09/19/2016   Procedure: LAPAROSCOPIC CHOLECYSTECTOMY POSSIBLE INTRAOPERATIVE CHOLANGIOGRAM;  Surgeon: Donnice Bury, MD;  Location: [facility] OR;  Service: General;  Laterality: N/A;   ERCP N/A 09/18/2016   Procedure: ENDOSCOPIC RETROGRADE CHOLANGIOPANCREATOGRAPHY (ERCP);  Surgeon: Victory LITTIE Legrand DOUGLAS, MD;  Location: [facility] ENDOSCOPY;  Service: Endoscopy;  Laterality: N/A;   FOOT SURGERY     patient reports four foot surgeries   FOOT SURGERY Left    bunion, hammer toe surgery   FOOT SURGERY Right    shaved bunion, hammer toe correction   HERNIA REPAIR     KNEE ARTHROSCOPY Right 06/02/1995   REPLACEMENT TOTAL KNEE Left    REPLACEMENT TOTAL KNEE Left 10/30/2012   RETINAL DETACHMENT SURGERY     RETINAL DETACHMENT SURGERY Left 06/01/2006   ROBOTIC ASSITED PARTIAL NEPHRECTOMY Left 06/03/2022   Procedure: XI ROBOTIC ASSITED PARTIAL NEPHRECTOMY WITH ULTRASOUND AND VENTRAL HERNIA REPAIR;  Surgeon: Alvaro Hummer, MD;  Location: [facility] ORS;  Service: Urology;  Laterality: Left;  3 HRS    THYROIDECTOMY, PARTIAL Right    THYROIDECTOMY, PARTIAL  06/02/2003   TOTAL KNEE ARTHROPLASTY Right 11/13/2019   Procedure: TOTAL KNEE ARTHROPLASTY SDDC;  Surgeon: Melodi Lerner, MD;  Location: [facility] ORS;  Service: Orthopedics;  Laterality: Right;    Patient Active Problem List   Diagnosis Date Noted   Renal mass 06/03/2022   Open wound of left side of back 01/20/2021   [place] 11/18/2020   Lipoma of back 11/18/2020   Lymphedema 11/18/2020   Melanocytic nevi of right upper limb, including shoulder 11/18/2020   Personal history of malignant melanoma of skin 11/18/2020   Sebaceous cyst 11/18/2020   Seborrheic dermatitis 11/18/2020   Pre-diabetes 04/03/2020   OA (osteoarthritis) of knee 11/13/2019   Total knee replacement status, 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   Pain in right knee 08/03/2019   Fluid collection at surgical site 10/20/2018   Encounter for postoperative examination after surgery for malignant neoplasm 09/30/2018   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/2018   Plantar fasciitis of right foot 02/03/2017   Porokeratosis 02/03/2017   Acute gallstone pancreatitis    Calculus of gallbladder with acute cholecystitis and obstruction    LFT elevation    Stress fracture 01/01/2016   Insomnia 12/16/2015   Peripheral neuropathy 10/18/2015   Renal insufficiency 03/06/2015   HTN (hypertension) 03/06/2015  Osteopenia 03/06/2015   GERD (gastroesophageal reflux disease) 03/06/2015   Vaginal pessary present 03/06/2015   Metatarsalgia 07/13/2012   Increased frequency of urination 04/13/2012   DDD (degenerative disc disease), cervical 01/19/2012   Knee stiffness 01/19/2012   Low back pain 01/19/2012   Neck pain 01/19/2012   Pain of hand 01/19/2012   Hallux valgus 09/20/2011   Hyperkeratosis 08/13/2011   Anxiety disorder 07/06/2011   Depression 07/06/2011   Diverticulosis of large intestine 07/06/2011   Non-toxic multinodular goiter 07/06/2011   Atrophic vaginitis 10/24/2010    Dermatographic urticaria 09/17/2010   Inflamed seborrheic keratosis 09/17/2010   Closed fracture of fifth metatarsal bone 07/10/2010   Diagnosis unknown 02/27/2010   Hip pain 06/08/2008   Pulmonary nodule seen on imaging study 06/08/2008    PCP: Watt Raisin, MD  REFERRING PROVIDER: Charlie Dolores, MD, [facility]  REFERRING DIAG: degeneration of discs of lumbar spine  THERAPY DIAG:  Chronic bilateral low back pain with right-sided sciatica  Difficulty in walking, not elsewhere classified  Chronic bilateral low back pain with left-sided sciatica  Rationale for Evaluation and Treatment: Rehabilitation  ONSET DATE: chronic  SUBJECTIVE:   SUBJECTIVE STATEMENT   doing pretty well   PERTINENT HISTORY: Saw orthopedist, took prednisone, which didn't help, orthopedist may want to do MRI, referred to PT by both PCP and orthopedist, also B degenerative arthritis hips   PAIN:  Are you having pain? 2/10 back  PRECAUTIONS: Fall  RED FLAGS: None   WEIGHT BEARING RESTRICTIONS: No  FALLS:  Has patient fallen in last 6 months? Yes. Number of falls several  LIVING ENVIRONMENT: Lives with: lives with their family and lives with their daughter Lives in: House/apartment Stairs: Yes: Internal: one flight steps; on left going up Has following equipment at home: Environmental consultant - 4 wheeled  OCCUPATION: retired  PLOF: Independent and Independent with basic ADLs  PATIENT GOALS: reduce pain hips and lower back so I can tolerate more activity  NEXT MD VISIT: not scheduled yet  OBJECTIVE:  Note: Objective measures were completed at Evaluation unless otherwise noted.  DIAGNOSTIC FINDINGS: X-ray done 11/23; IMPRESSION: 1. 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 2. Lower lumbar spondylosis.  PATIENT SURVEYS:  Modified Oswestry Modified Oswestry Low Back Pain Disability Questionnaire: 29 / 50 = 58.0 %   COGNITION: Overall cognitive status: Within  functional limits for tasks assessed     SENSATION: Light touch: WFL  EDEMA:  None noted   MUSCLE LENGTH: Hamstrings: Right wfl deg; Left wfl deg Debby test: Right wfl deg; Left wfl deg  POSTURE: scoliotic posture noted concavity L lumbar, pronounced thoracic kyphosis  PALPATION: Not tender with light palpation B gr trochanters hips  LOWER EXTREMITY ROM:B hip ROM wnl, able to achieve tailor position each leg in sitting  LUMBAR ROM: FB limited 15%, also risk of falling pt needed UE support Lumbar ext, 25% with marked side bending to L with this motion SB B 35% B with central lower back pain LOWER EXTREMITY MMT:  MMT Right eval Left eval  Hip flexion    Hip extension    Hip abduction    Hip adduction    Hip internal rotation    Hip external rotation    Knee flexion    Knee extension    Ankle dorsiflexion    Ankle plantarflexion    Ankle inversion    Ankle eversion     (Blank rows = not tested)   FUNCTIONAL TESTS:  5 times sit to stand: 25 sec, required mod assist of PT to avoid falling backwards into chair with final attempt  GAIT: Distance walked: in clinic up to 60  Assistive device utilized: Single point cane, Walker - 2 wheeled, and none Level of assistance: Min A without device and with cane Comments: very unstable without device                                                                                                                                 TREATMENT DATE:   03/23/24 Nustep L 5 10 min STS with wt ball 10 x chest press HS curl 25# 2 sets 10 Knee ext 10# 2 sets 10 Black tband 2 sets 10 flex and ext Seated row 20# 2 sets 10 Resisted gait 4 way 5 x each 20#   03/21/24 Walk outside 4 min working on cadence and stride. 1 standing rest break Resisted gait 4 way 5 x each 20# and did without LOB at lower resistance More square stepping with HHA.more fear of stepping and falling Nustep L 5 10 min STS with wt ball chest press Standing on  airex ball toss CGA  03/16/24 Nustep L 5 10 min BERG 42/56 STS 14.4 sec 5 x Foam beam in // bars 5 x tandem fwd and back,laterally Resisted gait 30# 5 x fwd and 5 x backward, 3 x each side- struggled with lateral mvmt  03/14/24 Nustep L 5 10 min Amb 6 min outside CGA no AD working on cadence and confidence. HHS with step ups on curbs and CGA with step down with encouragement, very fearful. Decreased balance with decline and struggled with incline and increased LBP d/t fwd flex, last 45 sec needed HHA d/t fatigue Cable pulleys 10# standing row 10x then shld ext 10x Seated row 2 sets 10 Lat pull down 2 sets 10 STS with 4# chest press 10 x 4# standing chest press and OH press 10 x   03/10/24 Nustep L 5 10 min STS wt ball chest press 10 x 7# farmers carry 1 lap each hand Step up with opp leg ext 10 x each with UE Step up laterally 10 x each with UE 30# 3 x fwd and backward Standing tband shld ext and row 2 sets 10 Standing tband hip ext and abd 2 sets 10  03/07/24 Nustep L 5 10 min Seated roling ball out for back stretch Seated resisted trunk ext Standing tband shld ext and row 2 sets 10 Standing tband hip ext and abd 2 sets 10  02/29/24 Nustep L 6 10 min Stepping over various heights and directions- HHA with LOB STS with wt ball chest press 10x Farmers carry 7# 1 lap each hand Resisted trunk ext 20 x HHA red tband SL hip flex,ext and abd 10 x Green tband shld ext and row 2 sets 10     02/22/24 Nustep L 6 10 min Resisted trunk ext  20 x Feet on ball bride, KTC, obl 10 x Iso abdominals 10 x hold 3 sec Green tband clams and hip flex 2 sets 10 Gentle PROM LE and trunk Red tband standing shld ext and row 2 sets 10  02/17/24 Nustep L 6 10 min In // bars foam beam fwd and back tandem 3 x then side stepping 3 x , light HHA STS from standard chair 2 sets 5 cg-min A 6 inch alt step tap 2 sets 10- LOB 2 not clearing foot  20# resisted gait fwd and back 5 x , then 3 x each  side CG-min A with 2 LOB Aura carry 6#  02/15/24 Nustep L 6 10 min STS with wt ball chest press 10 x Resisted trunk flex and ext 20 x Seated row 20# 2 sets 10 Knee ext 5# 2 sets 10 HS curl 20# 2 sets 10 Leg press 20# 3 sets 10   02/10/24 Nustep L 6 10 min STS wt ball chest press 10 x Knee ext 5# 2 sets 10 HS curl 20# 2 sets 10 Leg press 20# 3 sets 10 Tband standing shld ext and row 2 sets 10 Tband seated trunk flex and ext  02/08/24 Amb outside 300 feet with curb sup-CGA without AD with cuing to increase stride as she was taking short shuffle steps esp as she fatigued Nustep L 6 10 min STS wt ball CGA 10 x Light HHA vectro taps Light HHA stepping fwd and back over 2 in pole 3# HHA marching fwd and back 20 feet 2 x 3# HHA side stepping 20 feet 2 x  02/03/24 Nustep L 5 10 min BERG   36/56 Ball toss on airex CGA with 2 LOB STS from elevated mat 2 sets 5 on airex min A Obstacle course stepping on over and around various objects 6 in step taps 10 x each leg then alt CG-min A      02/01/24 Nustep L 5 10 min Amb HHA 5 min outside working on upright posture and cadence Trunk flex and ext 20 x blue tband STS wt ball 10x  HHA on airex stepping on and off fwd,SW and back 10 x each HS curl 25# 2 sets 10 Knee ext 5# 2 sets 10     01/25/24 Nustep L 5 STS wt ball CGA 10 x, cued to not use legs to brace on mat Standing on airex CGA shld ext and row 10 x Standing on airex HHA alt LE 20 x marching,SL hip flex,ext and abd Standing HHA marching fwd and back 20 feet 2 x each and side stepping 20 feet 2 x each Seated resisted trunk flex and ext 20 x Amb 500 feet HHA working on upright trunk, and cadence and she did very well- 2 brief standing rest breaks   01/18/24 Nustep L 5 Goals assessed and documented Knee ext 5# 2 set s10 HS curl 15# 2 sets 10 Resisted seated trunk ext 2 sets 10 Seated row 20# 10 x  15# 10 x 3# ankle wts HHA marcing fwd and back ward 15 feet 3  x each, side stepping 15 feet 3 x each 3# ankle wt HHA 10 x each SL hip flex,ext and abd  12/30/23 Nustep L 5 Goals assessed and documented STS 5x 16.54 sec Foam beam in // bars 3x side stepping and then tandem fwd and back CGA with light HHA STS with ball toss 10 x Ball toss on airex, only 1 LOB  2 sets 10 Knee ext 5# 2 set s10 HS curl 15# 2 sets 10 Farmer Carry 5# 1 lap each hand Resisted seated trunk ext 2 sets 10 Seated row 15# 2 sets 10     12/13/23 Nustep L 5 8 min  Knee ext 5# 2 set s10 HS curl 15# 2 sets 10 STS with 4# chest press 10x ADD ball squeeze 2x15 Hip abd w/annual resistance 2x15 Alt 4in box taps CGA 2x10  12/10/23 STS with 4# chest press 10x Assessed and documented goals Nustep L 5 Resisted gait 20# 3 x 4 ways- min A Knee ext 5# 2 set s10 HS curl 15# 2 sets 10  12/07/23 Resisted trunk flex and ext 20 x Seated resisted row and shld ext 2 sets 10 Nustep L 5 8 min Green tband HS curl 2 sets 10 3# LAQ 2 sets 10 3# HHA SL hip flex,ext and abd 10 x each- decreased RT LE mvmt ADD ball squeeze 15 x    12/02/23 Nustep L 5 BM for vacuuming to decrease strain on back Green tband HS curl,hip flex and clams seated 2 sets 10 ADD ball squeeze 2 sets 10 3# LAQ 2 sets 10 Red tband seated row and shld ext 2 sets 10 Tband trunk ext 2 sets 10 Standing with RW 3# hip ext and 10 x each      09/20/23:   Evaluation, explained to pt that long term management of arthritic changes in hips in spine, should respond well to moderate exercise. Practiced gait with st cane, pt without any marked improvement in her stability with the use of the cane.  Therefore practiced with front wheeled walker, marked improvement in gait pattern . Recommended that she use a front wheeled walker which is more light weight and easier to maneuver in and out of car for outings, she is a high fall risk.  PATIENT EDUCATION:  Education details: POC, goals Person educated:  Patient Education method: Explanation, Demonstration, Tactile cues, and Verbal cues Education comprehension: verbalized understanding and returned demonstration  HOME EXERCISE PROGRAM: Green t band hip abd/ER in sitting and green t band ankle pumps L .   ASSESSMENT:  CLINICAL IMPRESSION:   progressing well overall. Good and bad days. More active she is can increase pain so we talked about moderate activity with rest. Overall doing much more than several months ago and trying to get as strong as possible and better balance for granddaughter wedding in mid December. Pt still struggles navigating over and on objects.   OBJECTIVE IMPAIRMENTS: decreased activity tolerance, decreased balance, decreased knowledge of condition, decreased knowledge of use of DME, decreased mobility, difficulty walking, decreased ROM, decreased strength, decreased safety awareness, impaired perceived functional ability, postural dysfunction, and pain.   ACTIVITY LIMITATIONS: carrying, lifting, bending, standing, squatting, transfers, bed mobility, and locomotion level  PARTICIPATION LIMITATIONS: meal prep, cleaning, laundry, shopping, community activity, and yard work  PERSONAL FACTORS: Age, Behavior pattern, Fitness, Past/current experiences, Time since onset of injury/illness/exacerbation, and 1-2 comorbidities: severe DJD B hips, lumbar region, neuropathy are also affecting patient's functional outcome.   REHAB POTENTIAL: Good  CLINICAL DECISION MAKING: Evolving/moderate complexity  EVALUATION COMPLEXITY: Moderate   GOALS: Goals reviewed with patient? Yes  SHORT TERM GOALS: Target date: 2 weeks 5 5/25 I HEP Baseline: Goal status: MET 10/26/23   LONG TERM GOALS: Target date:   Modified Oswestry Low Back Pain Disability Questionnaire: 29 / 50 = 58.0 %  Baseline:  Goal status: 11/11/23 progressing  and 11/18/23  11/30/23 progressing 12/10/23 progressing   12/30/23 progressing   01/18/24 progressing 02/10/24  MET  2.  Safe gait with appropriate device , over 350' for safe household distances, without balance loss Baseline:  Goal status: on going 10/26/23  progressing 11/04/23  and 11/11/23  Progressing 11/18/23  progressing 11/30/23 distance met but unsteady  12/10/23  amb without AD 250 feet, no LOB  but instability with lateral sway 12/30/23 progressing 250 feet then slows down and balance decreases 01/18/24 progressing  Progressing and showing good improvements 02/01/24 02/10/24 progressing and 02/17/24. Progressing 03/10/24  03/16/24 met for distance but still moments of unsteadiness  03/23/24 progressing  3.  5 x sit to stand 14.8 sec or less without balance loss Baseline: 25 sec,with UE support B  required mod assist on final rep to avoid fall  Goal status: progressing 10/26/23  progressing 11/05/23  11/18/23 multi tries and posterior lean,progressing  11/30/23 LOB progressing  12/10/23 20 sec 12/30/23 progressing   01/18/24 18.5 sec with 1 x bracing knees 02/01/24 progressing uses knees to brace with 02/10/24 16 sec progressing  03/10/24 15 sec with no LOB or hesitation 03/16/24 14.4 sec MET  4. Will increase BERG score to 45 to demonstrate improved balance   Baseline: 36  Goal status: progressing 02/17/24  and 02/29/24 03/16/24 42/56 progressing    PLAN:  PT FREQUENCY: 2x/week  PT DURATION: 12 weeks  PLANNED INTERVENTIONS: 97110-Therapeutic exercises, 97530- Therapeutic activity, 97112- Neuromuscular re-education, 97535- Self Care, [id number]- Manual therapy, G0283- Electrical stimulation (unattended), and 97033- Ionotophoresis 4mg /ml Dexamethasone   PLAN FOR NEXT SESSION:  progress func balance and strength. Renewal next week    Minas Bonser,ANGIE, PTA,  03/23/2024, 2:52 PM [facility] [place], [place], [address] Phone: [phone]   Fax:  [phone]

## 2024-03-30 ENCOUNTER — Ambulatory Visit: Admitting: Physical Therapy

## 2024-04-03 ENCOUNTER — Ambulatory Visit (INDEPENDENT_AMBULATORY_CARE_PROVIDER_SITE_OTHER): Admitting: Podiatry

## 2024-04-03 DIAGNOSIS — D2372 Other benign neoplasm of skin of left lower limb, including hip: Secondary | ICD-10-CM | POA: Diagnosis not present

## 2024-04-03 DIAGNOSIS — M2061 Acquired deformities of toe(s), unspecified, right foot: Secondary | ICD-10-CM

## 2024-04-03 DIAGNOSIS — G629 Polyneuropathy, unspecified: Secondary | ICD-10-CM

## 2024-04-04 ENCOUNTER — Ambulatory Visit: Attending: Family Medicine | Admitting: Physical Therapy

## 2024-04-04 DIAGNOSIS — G8929 Other chronic pain: Secondary | ICD-10-CM | POA: Diagnosis present

## 2024-04-04 DIAGNOSIS — R262 Difficulty in walking, not elsewhere classified: Secondary | ICD-10-CM | POA: Insufficient documentation

## 2024-04-04 DIAGNOSIS — M5441 Lumbago with sciatica, right side: Secondary | ICD-10-CM | POA: Insufficient documentation

## 2024-04-04 DIAGNOSIS — R293 Abnormal posture: Secondary | ICD-10-CM | POA: Diagnosis present

## 2024-04-04 DIAGNOSIS — M5442 Lumbago with sciatica, left side: Secondary | ICD-10-CM | POA: Insufficient documentation

## 2024-04-04 NOTE — Therapy (Signed)
 OUTPATIENT PHYSICAL THERAPY LOWER EXTREMITY .         Patient Name: GENEAL HUEBERT MRN: [id number] DOB:10-24-1940, 83 y.o., female Today's Date: 04/04/2024  END OF SESSION:  PT End of Session - 04/04/24 0928     Visit Number 33    Date for Recertification  05/05/24    PT Start Time 0930    PT Stop Time 1010    PT Time Calculation (min) 40 min            Past Medical History:  Diagnosis Date   Anxiety    Arthritis    Cancer (HCC)    lt. foot melanoma   Chronic kidney disease    lt. kidney cyst   Coronary artery disease    GERD (gastroesophageal reflux disease)    History of healed stress fracture 2013   bilateral feet   Hx of phlebitis    reports remote hx of superfical blood clots never anticoagulated   Hypertension    Osteopenia    Pneumonia    Past Surgical History:  Procedure Laterality Date   CATARACT EXTRACTION W/ INTRAOCULAR LENS IMPLANT Bilateral    CHOLECYSTECTOMY N/A 09/19/2016   Procedure: LAPAROSCOPIC CHOLECYSTECTOMY POSSIBLE INTRAOPERATIVE CHOLANGIOGRAM;  Surgeon: Donnice Bury, MD;  Location: [facility] OR;  Service: General;  Laterality: N/A;   ERCP N/A 09/18/2016   Procedure: ENDOSCOPIC RETROGRADE CHOLANGIOPANCREATOGRAPHY (ERCP);  Surgeon: Victory LITTIE Legrand DOUGLAS, MD;  Location: [facility] ENDOSCOPY;  Service: Endoscopy;  Laterality: N/A;   FOOT SURGERY     patient reports four foot surgeries   FOOT SURGERY Left    bunion, hammer toe surgery   FOOT SURGERY Right    shaved bunion, hammer toe correction   HERNIA REPAIR     KNEE ARTHROSCOPY Right 06/02/1995   REPLACEMENT TOTAL KNEE Left    REPLACEMENT TOTAL KNEE Left 10/30/2012   RETINAL DETACHMENT SURGERY     RETINAL DETACHMENT SURGERY Left 06/01/2006   ROBOTIC ASSITED PARTIAL NEPHRECTOMY Left 06/03/2022   Procedure: XI ROBOTIC ASSITED PARTIAL NEPHRECTOMY WITH ULTRASOUND AND VENTRAL HERNIA REPAIR;  Surgeon: Alvaro Hummer, MD;  Location: [facility] ORS;  Service: Urology;  Laterality: Left;  3 HRS    THYROIDECTOMY, PARTIAL Right    THYROIDECTOMY, PARTIAL  06/02/2003   TOTAL KNEE ARTHROPLASTY Right 11/13/2019   Procedure: TOTAL KNEE ARTHROPLASTY SDDC;  Surgeon: Melodi Lerner, MD;  Location: [facility] ORS;  Service: Orthopedics;  Laterality: Right;    Patient Active Problem List   Diagnosis Date Noted   Renal mass 06/03/2022   Open wound of left side of back 01/20/2021   [place] 11/18/2020   Lipoma of back 11/18/2020   Lymphedema 11/18/2020   Melanocytic nevi of right upper limb, including shoulder 11/18/2020   Personal history of malignant melanoma of skin 11/18/2020   Sebaceous cyst 11/18/2020   Seborrheic dermatitis 11/18/2020   Pre-diabetes 04/03/2020   OA (osteoarthritis) of knee 11/13/2019   Total knee replacement status, 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   Pain in right knee 08/03/2019   Fluid collection at surgical site 10/20/2018   Encounter for postoperative examination after surgery for malignant neoplasm 09/30/2018   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/2018   Plantar fasciitis of right foot 02/03/2017   Porokeratosis 02/03/2017   Acute gallstone pancreatitis    Calculus of gallbladder with acute cholecystitis and obstruction    LFT elevation    Stress fracture 01/01/2016   Insomnia 12/16/2015   Peripheral neuropathy 10/18/2015   Renal insufficiency 03/06/2015   HTN (hypertension) 03/06/2015  Osteopenia 03/06/2015   GERD (gastroesophageal reflux disease) 03/06/2015   Vaginal pessary present 03/06/2015   Metatarsalgia 07/13/2012   Increased frequency of urination 04/13/2012   DDD (degenerative disc disease), cervical 01/19/2012   Knee stiffness 01/19/2012   Low back pain 01/19/2012   Neck pain 01/19/2012   Pain of hand 01/19/2012   Hallux valgus 09/20/2011   Hyperkeratosis 08/13/2011   Anxiety disorder 07/06/2011   Depression 07/06/2011   Diverticulosis of large intestine 07/06/2011   Non-toxic multinodular goiter 07/06/2011   Atrophic vaginitis 10/24/2010    Dermatographic urticaria 09/17/2010   Inflamed seborrheic keratosis 09/17/2010   Closed fracture of fifth metatarsal bone 07/10/2010   Diagnosis unknown 02/27/2010   Hip pain 06/08/2008   Pulmonary nodule seen on imaging study 06/08/2008    PCP: Watt Raisin, MD  REFERRING PROVIDER: Charlie Dolores, MD, [facility]  REFERRING DIAG: degeneration of discs of lumbar spine  THERAPY DIAG:  Chronic bilateral low back pain with right-sided sciatica  Difficulty in walking, not elsewhere classified  Posture imbalance  Rationale for Evaluation and Treatment: Rehabilitation  ONSET DATE: chronic  SUBJECTIVE:   SUBJECTIVE STATEMENT   sorry I missed last week I was sick. Doing pretty good.fell yesterday trying ot step up curb-missed it, denies injuries   PERTINENT HISTORY: Saw orthopedist, took prednisone, which didn't help, orthopedist may want to do MRI, referred to PT by both PCP and orthopedist, also B degenerative arthritis hips   PAIN:  Are you having pain? 2/10 back  PRECAUTIONS: Fall  RED FLAGS: None   WEIGHT BEARING RESTRICTIONS: No  FALLS:  Has patient fallen in last 6 months? Yes. Number of falls several  LIVING ENVIRONMENT: Lives with: lives with their family and lives with their daughter Lives in: House/apartment Stairs: Yes: Internal: one flight steps; on left going up Has following equipment at home: Environmental Consultant - 4 wheeled  OCCUPATION: retired  PLOF: Independent and Independent with basic ADLs  PATIENT GOALS: reduce pain hips and lower back so I can tolerate more activity  NEXT MD VISIT: not scheduled yet  OBJECTIVE:  Note: Objective measures were completed at Evaluation unless otherwise noted.  DIAGNOSTIC FINDINGS: X-ray done 11/23; IMPRESSION: 1. 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 2. Lower lumbar spondylosis.  PATIENT SURVEYS:  Modified Oswestry Modified Oswestry Low Back Pain Disability Questionnaire: 29  / 50 = 58.0 %   COGNITION: Overall cognitive status: Within functional limits for tasks assessed     SENSATION: Light touch: WFL  EDEMA:  None noted   MUSCLE LENGTH: Hamstrings: Right wfl deg; Left wfl deg Debby test: Right wfl deg; Left wfl deg  POSTURE: scoliotic posture noted concavity L lumbar, pronounced thoracic kyphosis  PALPATION: Not tender with light palpation B gr trochanters hips  LOWER EXTREMITY ROM:B hip ROM wnl, able to achieve tailor position each leg in sitting  LUMBAR ROM: FB limited 15%, also risk of falling pt needed UE support Lumbar ext, 25% with marked side bending to L with this motion SB B 35% B with central lower back pain LOWER EXTREMITY MMT:  MMT Right eval Left eval  Hip flexion    Hip extension    Hip abduction    Hip adduction    Hip internal rotation    Hip external rotation    Knee flexion    Knee extension    Ankle dorsiflexion    Ankle plantarflexion    Ankle inversion    Ankle eversion     (  Blank rows = not tested)   FUNCTIONAL TESTS:  5 times sit to stand: 25 sec, required mod assist of PT to avoid falling backwards into chair with final attempt  GAIT: Distance walked: in clinic up to 60  Assistive device utilized: Single point cane, Walker - 2 wheeled, and none Level of assistance: Min A without device and with cane Comments: very unstable without device                                                                                                                                 TREATMENT DATE:   04/04/24 BERG 45/56 administered and assessed other goals Nustep L 5 10 min 3# single HHS 6 inch step tap fwd and laterally BIL 3# HHA marching fwd and back 15 feet 3 x then laterally 3# stepping fwd  over foam beams with min A 3# laterally stepping over foam beam min A  03/23/24 Nustep L 5 10 min STS with wt ball 10 x chest press HS curl 25# 2 sets 10 Knee ext 10# 2 sets 10 Black tband 2 sets 10 flex and  ext Seated row 20# 2 sets 10 Resisted gait 4 way 5 x each 20#   03/21/24 Walk outside 4 min working on cadence and stride. 1 standing rest break Resisted gait 4 way 5 x each 20# and did without LOB at lower resistance More square stepping with HHA.more fear of stepping and falling Nustep L 5 10 min STS with wt ball chest press Standing on airex ball toss CGA  03/16/24 Nustep L 5 10 min BERG 42/56 STS 14.4 sec 5 x Foam beam in // bars 5 x tandem fwd and back,laterally Resisted gait 30# 5 x fwd and 5 x backward, 3 x each side- struggled with lateral mvmt  03/14/24 Nustep L 5 10 min Amb 6 min outside CGA no AD working on cadence and confidence. HHS with step ups on curbs and CGA with step down with encouragement, very fearful. Decreased balance with decline and struggled with incline and increased LBP d/t fwd flex, last 45 sec needed HHA d/t fatigue Cable pulleys 10# standing row 10x then shld ext 10x Seated row 2 sets 10 Lat pull down 2 sets 10 STS with 4# chest press 10 x 4# standing chest press and OH press 10 x   03/10/24 Nustep L 5 10 min STS wt ball chest press 10 x 7# farmers carry 1 lap each hand Step up with opp leg ext 10 x each with UE Step up laterally 10 x each with UE 30# 3 x fwd and backward Standing tband shld ext and row 2 sets 10 Standing tband hip ext and abd 2 sets 10  03/07/24 Nustep L 5 10 min Seated roling ball out for back stretch Seated resisted trunk ext Standing tband shld ext and row 2 sets 10 Standing tband hip ext and abd 2 sets 10  02/29/24  Nustep L 6 10 min Stepping over various heights and directions- HHA with LOB STS with wt ball chest press 10x Farmers carry 7# 1 lap each hand Resisted trunk ext 20 x HHA red tband SL hip flex,ext and abd 10 x Green tband shld ext and row 2 sets 10     02/22/24 Nustep L 6 10 min Resisted trunk ext 20 x Feet on ball bride, KTC, obl 10 x Iso abdominals 10 x hold 3 sec Green tband clams and hip  flex 2 sets 10 Gentle PROM LE and trunk Red tband standing shld ext and row 2 sets 10  02/17/24 Nustep L 6 10 min In // bars foam beam fwd and back tandem 3 x then side stepping 3 x , light HHA STS from standard chair 2 sets 5 cg-min A 6 inch alt step tap 2 sets 10- LOB 2 not clearing foot  20# resisted gait fwd and back 5 x , then 3 x each side CG-min A with 2 LOB Aura carry 6#  02/15/24 Nustep L 6 10 min STS with wt ball chest press 10 x Resisted trunk flex and ext 20 x Seated row 20# 2 sets 10 Knee ext 5# 2 sets 10 HS curl 20# 2 sets 10 Leg press 20# 3 sets 10   02/10/24 Nustep L 6 10 min STS wt ball chest press 10 x Knee ext 5# 2 sets 10 HS curl 20# 2 sets 10 Leg press 20# 3 sets 10 Tband standing shld ext and row 2 sets 10 Tband seated trunk flex and ext  02/08/24 Amb outside 300 feet with curb sup-CGA without AD with cuing to increase stride as she was taking short shuffle steps esp as she fatigued Nustep L 6 10 min STS wt ball CGA 10 x Light HHA vectro taps Light HHA stepping fwd and back over 2 in pole 3# HHA marching fwd and back 20 feet 2 x 3# HHA side stepping 20 feet 2 x  02/03/24 Nustep L 5 10 min BERG   36/56 Ball toss on airex CGA with 2 LOB STS from elevated mat 2 sets 5 on airex min A Obstacle course stepping on over and around various objects 6 in step taps 10 x each leg then alt CG-min A      02/01/24 Nustep L 5 10 min Amb HHA 5 min outside working on upright posture and cadence Trunk flex and ext 20 x blue tband STS wt ball 10x  HHA on airex stepping on and off fwd,SW and back 10 x each HS curl 25# 2 sets 10 Knee ext 5# 2 sets 10     01/25/24 Nustep L 5 STS wt ball CGA 10 x, cued to not use legs to brace on mat Standing on airex CGA shld ext and row 10 x Standing on airex HHA alt LE 20 x marching,SL hip flex,ext and abd Standing HHA marching fwd and back 20 feet 2 x each and side stepping 20 feet 2 x each Seated resisted trunk  flex and ext 20 x Amb 500 feet HHA working on upright trunk, and cadence and she did very well- 2 brief standing rest breaks   01/18/24 Nustep L 5 Goals assessed and documented Knee ext 5# 2 set s10 HS curl 15# 2 sets 10 Resisted seated trunk ext 2 sets 10 Seated row 20# 10 x  15# 10 x 3# ankle wts HHA marcing fwd and back ward 15 feet 3 x  each, side stepping 15 feet 3 x each 3# ankle wt HHA 10 x each SL hip flex,ext and abd  12/30/23 Nustep L 5 Goals assessed and documented STS 5x 16.54 sec Foam beam in // bars 3x side stepping and then tandem fwd and back CGA with light HHA STS with ball toss 10 x Ball toss on airex, only 1 LOB 2 sets 10 Knee ext 5# 2 set s10 HS curl 15# 2 sets 10 [employer] 5# 1 lap each hand Resisted seated trunk ext 2 sets 10 Seated row 15# 2 sets 10     12/13/23 Nustep L 5 8 min  Knee ext 5# 2 set s10 HS curl 15# 2 sets 10 STS with 4# chest press 10x ADD ball squeeze 2x15 Hip abd w/annual resistance 2x15 Alt 4in box taps CGA 2x10  12/10/23 STS with 4# chest press 10x Assessed and documented goals Nustep L 5 Resisted gait 20# 3 x 4 ways- min A Knee ext 5# 2 set s10 HS curl 15# 2 sets 10  12/07/23 Resisted trunk flex and ext 20 x Seated resisted row and shld ext 2 sets 10 Nustep L 5 8 min Green tband HS curl 2 sets 10 3# LAQ 2 sets 10 3# HHA SL hip flex,ext and abd 10 x each- decreased RT LE mvmt ADD ball squeeze 15 x    12/02/23 Nustep L 5 BM for vacuuming to decrease strain on back Green tband HS curl,hip flex and clams seated 2 sets 10 ADD ball squeeze 2 sets 10 3# LAQ 2 sets 10 Red tband seated row and shld ext 2 sets 10 Tband trunk ext 2 sets 10 Standing with RW 3# hip ext and 10 x each      09/20/23:   Evaluation, explained to pt that long term management of arthritic changes in hips in spine, should respond well to moderate exercise. Practiced gait with st cane, pt without any marked improvement in her  stability with the use of the cane.  Therefore practiced with front wheeled walker, marked improvement in gait pattern . Recommended that she use a front wheeled walker which is more light weight and easier to maneuver in and out of car for outings, she is a high fall risk.  PATIENT EDUCATION:  Education details: POC, goals Person educated: Patient Education method: Explanation, Demonstration, Tactile cues, and Verbal cues Education comprehension: verbalized understanding and returned demonstration  HOME EXERCISE PROGRAM: Green t band hip abd/ER in sitting and green t band ankle pumps L .   ASSESSMENT:  CLINICAL IMPRESSION:   pt arrives stating she feel yesterday trying to step up on curb , missed curb denies any injuries. Pnt did score a 45 on BERG dispute recent fall. Pt does struggle with picking up legs and especially navigating obstacles in that SLS moment without UE assist. CG- min A needed with all balance ex. Pnt is very motivated to get walking better and better balance as she will be traveling in Dec to her granddaughter wedding and walking her down the isle- will renew for another month with updated goals OBJECTIVE IMPAIRMENTS: decreased activity tolerance, decreased balance, decreased knowledge of condition, decreased knowledge of use of DME, decreased mobility, difficulty walking, decreased ROM, decreased strength, decreased safety awareness, impaired perceived functional ability, postural dysfunction, and pain.   ACTIVITY LIMITATIONS: carrying, lifting, bending, standing, squatting, transfers, bed mobility, and locomotion level  PARTICIPATION LIMITATIONS: meal prep, cleaning, laundry, shopping, community activity, and yard work  PERSONAL FACTORS: Age, Behavior pattern, Fitness, Past/current experiences, Time since onset of injury/illness/exacerbation, and 1-2 comorbidities: severe DJD B hips, lumbar region, neuropathy are also affecting patient's functional outcome.   REHAB  POTENTIAL: Good  CLINICAL DECISION MAKING: Evolving/moderate complexity  EVALUATION COMPLEXITY: Moderate   GOALS: Goals reviewed with patient? Yes  SHORT TERM GOALS: Target date: 2 weeks 5 5/25 I HEP Baseline: Goal status: MET 10/26/23   LONG TERM GOALS: Target date:   Modified Oswestry Low Back Pain Disability Questionnaire: 29 / 50 = 58.0 %  Baseline:  Goal status: 11/11/23 progressing  and 11/18/23  11/30/23 progressing 12/10/23 progressing   12/30/23 progressing   01/18/24 progressing 02/10/24 MET  2.  Safe gait with appropriate device , over 350' for safe household distances, without balance loss REVISED GOAL: 500+ ft  Baseline:  Goal status: on going 10/26/23  progressing 11/04/23  and 11/11/23  Progressing 11/18/23  progressing 11/30/23 distance met but unsteady  12/10/23  amb without AD 250 feet, no LOB  but instability with lateral sway 12/30/23 progressing 250 feet then slows down and balance decreases 01/18/24 progressing  Progressing and showing good improvements 02/01/24 02/10/24 progressing and 02/17/24. Progressing 03/10/24  03/16/24 met for distance but still moments of unsteadiness  03/23/24 progressing 04/04/24 MET  3.  5 x sit to stand 14.8 sec or less without balance loss REVISED GOAL: < 13s Baseline: 25 sec,with UE support B  required mod assist on final rep to avoid fall  Goal status: progressing 10/26/23  progressing 11/05/23  11/18/23 multi tries and posterior lean,progressing  11/30/23 LOB progressing  12/10/23 20 sec 12/30/23 progressing   01/18/24 18.5 sec with 1 x bracing knees 02/01/24 progressing uses knees to brace with 02/10/24 16 sec progressing  03/10/24 15 sec with no LOB or hesitation 03/16/24 14.4 sec MET    4. Will increase BERG score to 45 to demonstrate improved balance   Baseline: 36 REVISED GOAL: 48  Goal status: progressing 02/17/24  and 02/29/24 03/16/24 42/56 progressing  04/04/24    PLAN:  PT FREQUENCY: 2x/week  PT DURATION: 12 weeks  PLANNED  INTERVENTIONS: 97110-Therapeutic exercises, 97530- Therapeutic activity, 97112- Neuromuscular re-education, 97535- Self Care, 02859- Manual therapy, G0283- Electrical stimulation (unattended), and 97033- Ionotophoresis 4mg /ml Dexamethasone   PLAN FOR NEXT SESSION:  progress balance. Renewal done with updated goals  Shravan Salahuddin,ANGIE, PTA,  04/04/2024, 10:01 AM [facility] [address] [facility] [address] [place], [place], [address] Phone: [phone]   Fax:  [phone]

## 2024-04-04 NOTE — Progress Notes (Signed)
 Subjective: Chief Complaint  Patient presents with   Peripheral Neuropathy    Pt stated that things are about the same      83 year old female presents for follow-up evaluation of foot pain.  She states that she excellently took extra gabapentin  and she has been around early but she was discussed a refill.  She is taking it 4 times a day which has been helping more.   She does not report any ulcerations but she does get pain in between where she had her second toe removed and the third toe on the right foot.  She does not report any drainage.  Objective: AAO x3, NAD DP/PT pulses palpable bilaterally, CRT less than 3 seconds Varicose veins are present with venous insufficiency noted. Sensation decreased with Semmes Weinstein monofilament. 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  This has been rubbing along the stump of the second toe and this is a preulcerative area noted this area.  There is no drainage or pus or any signs of infection noted otherwise. Hyperkeratotic lesion submetatarsal bilaterally.  Upon debridement there is no underlying ulceration, drainage or signs of infection.  Uniform skin color.  No pinpoint bleeding. No pain with calf compression, swelling, warmth, erythema  Assessment: Skin lesion left foot; digital deformity; neuropathy  Plan:  Left foot - Sharply debrided skin lesion without any complications or bleeding.  There is be uniform in color and more of a callus.  If symptoms persist need to consider excisional biopsy given clinically appears to be benign.  Neuropathy - Continue gabapentin -will continue 4 times a day discussed not taking extra - Continue compound cream through [employer]. - Physical therapy for her back which has been helping as well  Digital contractures - She has a new area between where the second toe was in the third toe of the right foot.  Dispensed a gel offloading toe spacer.  Monitoring  signs or symptoms of infection and/or skin breakdown.  No follow-ups on file.  Donnice JONELLE Fees DPM

## 2024-04-06 ENCOUNTER — Ambulatory Visit: Admitting: Physical Therapy

## 2024-04-06 DIAGNOSIS — R293 Abnormal posture: Secondary | ICD-10-CM | POA: Diagnosis not present

## 2024-04-06 DIAGNOSIS — G8929 Other chronic pain: Secondary | ICD-10-CM | POA: Diagnosis not present

## 2024-04-06 DIAGNOSIS — R262 Difficulty in walking, not elsewhere classified: Secondary | ICD-10-CM

## 2024-04-06 DIAGNOSIS — M5441 Lumbago with sciatica, right side: Secondary | ICD-10-CM | POA: Diagnosis not present

## 2024-04-06 DIAGNOSIS — M5442 Lumbago with sciatica, left side: Secondary | ICD-10-CM | POA: Diagnosis not present

## 2024-04-06 NOTE — Therapy (Signed)
 OUTPATIENT PHYSICAL THERAPY LOWER EXTREMITY .         Patient Name: Margaret [name] MRN: [id number] DOB:1940/12/24, 83 y.o., female Today's Date: 04/06/2024  END OF SESSION:  PT End of Session - 04/06/24 1438     Visit Number 34    Date for Recertification  05/05/24    PT Start Time 1430    PT Stop Time 1515    PT Time Calculation (min) 45 min            Past Medical History:  Diagnosis Date   Anxiety    Arthritis    Cancer (HCC)    lt. foot melanoma   Chronic kidney disease    lt. kidney cyst   Coronary artery disease    GERD (gastroesophageal reflux disease)    History of healed stress fracture 2013   bilateral feet   Hx of phlebitis    reports remote hx of superfical blood clots never anticoagulated   Hypertension    Osteopenia    Pneumonia    Past Surgical History:  Procedure Laterality Date   CATARACT EXTRACTION W/ INTRAOCULAR LENS IMPLANT Bilateral    CHOLECYSTECTOMY N/A 09/19/2016   Procedure: LAPAROSCOPIC CHOLECYSTECTOMY POSSIBLE INTRAOPERATIVE CHOLANGIOGRAM;  Surgeon: Donnice Bury, MD;  Location: [facility] OR;  Service: General;  Laterality: N/A;   ERCP N/A 09/18/2016   Procedure: ENDOSCOPIC RETROGRADE CHOLANGIOPANCREATOGRAPHY (ERCP);  Surgeon: Victory LITTIE Legrand DOUGLAS, MD;  Location: [facility] ENDOSCOPY;  Service: Endoscopy;  Laterality: N/A;   FOOT SURGERY     patient reports four foot surgeries   FOOT SURGERY Left    bunion, hammer toe surgery   FOOT SURGERY Right    shaved bunion, hammer toe correction   HERNIA REPAIR     KNEE ARTHROSCOPY Right 06/02/1995   REPLACEMENT TOTAL KNEE Left    REPLACEMENT TOTAL KNEE Left 10/30/2012   RETINAL DETACHMENT SURGERY     RETINAL DETACHMENT SURGERY Left 06/01/2006   ROBOTIC ASSITED PARTIAL NEPHRECTOMY Left 06/03/2022   Procedure: XI ROBOTIC ASSITED PARTIAL NEPHRECTOMY WITH ULTRASOUND AND VENTRAL HERNIA REPAIR;  Surgeon: Alvaro Hummer, MD;  Location: [facility] ORS;  Service: Urology;  Laterality: Left;  3 HRS    THYROIDECTOMY, PARTIAL Right    THYROIDECTOMY, PARTIAL  06/02/2003   TOTAL KNEE ARTHROPLASTY Right 11/13/2019   Procedure: TOTAL KNEE ARTHROPLASTY SDDC;  Surgeon: Melodi Lerner, MD;  Location: [facility] ORS;  Service: Orthopedics;  Laterality: Right;    Patient Active Problem List   Diagnosis Date Noted   Renal mass 06/03/2022   Open wound of left side of back 01/20/2021   [place] 11/18/2020   Lipoma of back 11/18/2020   Lymphedema 11/18/2020   Melanocytic nevi of right upper limb, including shoulder 11/18/2020   Personal history of malignant melanoma of skin 11/18/2020   Sebaceous cyst 11/18/2020   Seborrheic dermatitis 11/18/2020   Pre-diabetes 04/03/2020   OA (osteoarthritis) of knee 11/13/2019   Total knee replacement status, 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   Pain in right knee 08/03/2019   Fluid collection at surgical site 10/20/2018   Encounter for postoperative examination after surgery for malignant neoplasm 09/30/2018   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/2018   Plantar fasciitis of right foot 02/03/2017   Porokeratosis 02/03/2017   Acute gallstone pancreatitis    Calculus of gallbladder with acute cholecystitis and obstruction    LFT elevation    Stress fracture 01/01/2016   Insomnia 12/16/2015   Peripheral neuropathy 10/18/2015   Renal insufficiency 03/06/2015   HTN (hypertension) 03/06/2015  Osteopenia 03/06/2015   GERD (gastroesophageal reflux disease) 03/06/2015   Vaginal pessary present 03/06/2015   Metatarsalgia 07/13/2012   Increased frequency of urination 04/13/2012   DDD (degenerative disc disease), cervical 01/19/2012   Knee stiffness 01/19/2012   Low back pain 01/19/2012   Neck pain 01/19/2012   Pain of hand 01/19/2012   Hallux valgus 09/20/2011   Hyperkeratosis 08/13/2011   Anxiety disorder 07/06/2011   Depression 07/06/2011   Diverticulosis of large intestine 07/06/2011   Non-toxic multinodular goiter 07/06/2011   Atrophic vaginitis 10/24/2010    Dermatographic urticaria 09/17/2010   Inflamed seborrheic keratosis 09/17/2010   Closed fracture of fifth metatarsal bone 07/10/2010   Diagnosis unknown 02/27/2010   Hip pain 06/08/2008   Pulmonary nodule seen on imaging study 06/08/2008    PCP: Watt Raisin, MD  REFERRING PROVIDER: Charlie Dolores, MD, [facility]  REFERRING DIAG: degeneration of discs of lumbar spine  THERAPY DIAG:  Chronic bilateral low back pain with right-sided sciatica  Difficulty in walking, not elsewhere classified  Posture imbalance  Chronic bilateral low back pain with left-sided sciatica  Rationale for Evaluation and Treatment: Rehabilitation  ONSET DATE: chronic  SUBJECTIVE:   SUBJECTIVE STATEMENT   back is hurting today,   PERTINENT HISTORY: Saw orthopedist, took prednisone, which didn't help, orthopedist may want to do MRI, referred to PT by both PCP and orthopedist, also B degenerative arthritis hips   PAIN:  Are you having pain? 7/10 back  PRECAUTIONS: Fall  RED FLAGS: None   WEIGHT BEARING RESTRICTIONS: No  FALLS:  Has patient fallen in last 6 months? Yes. Number of falls several  LIVING ENVIRONMENT: Lives with: lives with their family and lives with their daughter Lives in: House/apartment Stairs: Yes: Internal: one flight steps; on left going up Has following equipment at home: Environmental Consultant - 4 wheeled  OCCUPATION: retired  PLOF: Independent and Independent with basic ADLs  PATIENT GOALS: reduce pain hips and lower back so I can tolerate more activity  NEXT MD VISIT: not scheduled yet  OBJECTIVE:  Note: Objective measures were completed at Evaluation unless otherwise noted.  DIAGNOSTIC FINDINGS: X-ray done 11/23; IMPRESSION: 1. 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 2. Lower lumbar spondylosis.  PATIENT SURVEYS:  Modified Oswestry Modified Oswestry Low Back Pain Disability Questionnaire: 29 / 50 = 58.0 %   COGNITION: Overall  cognitive status: Within functional limits for tasks assessed     SENSATION: Light touch: WFL  EDEMA:  None noted   MUSCLE LENGTH: Hamstrings: Right wfl deg; Left wfl deg Debby test: Right wfl deg; Left wfl deg  POSTURE: scoliotic posture noted concavity L lumbar, pronounced thoracic kyphosis  PALPATION: Not tender with light palpation B gr trochanters hips  LOWER EXTREMITY ROM:B hip ROM wnl, able to achieve tailor position each leg in sitting  LUMBAR ROM: FB limited 15%, also risk of falling pt needed UE support Lumbar ext, 25% with marked side bending to L with this motion SB B 35% B with central lower back pain LOWER EXTREMITY MMT:  MMT Right eval Left eval  Hip flexion    Hip extension    Hip abduction    Hip adduction    Hip internal rotation    Hip external rotation    Knee flexion    Knee extension    Ankle dorsiflexion    Ankle plantarflexion    Ankle inversion    Ankle eversion     (Blank rows = not tested)  FUNCTIONAL TESTS:  5 times sit to stand: 25 sec, required mod assist of PT to avoid falling backwards into chair with final attempt  GAIT: Distance walked: in clinic up to 60  Assistive device utilized: Single point cane, Walker - 2 wheeled, and none Level of assistance: Min A without device and with cane Comments: very unstable without device                                                                                                                                 TREATMENT DATE:   04/06/24 Resisted gait 30 # 5 x fwd and back CGA, laterally min a with decreased eccentric control 3 x each Nustep L 5 10 min 6 inch alt step taps 20 x 2 sets 6 inch step ups 10 x BIL with UE support Kicking ball min A Ball toss on airex- CGA 1 LOB sitting back on mat Min a vector taps HS curl 20# 2 sets 10 Knee ext 10# 2 sets 10 STS wt ball 10 x  04/04/24 BERG 45/56 administered and assessed other goals Nustep L 5 10 min 3# single HHS 6 inch step tap  fwd and laterally BIL 3# HHA marching fwd and back 15 feet 3 x then laterally 3# stepping fwd  over foam beams with min A 3# laterally stepping over foam beam min A  03/23/24 Nustep L 5 10 min STS with wt ball 10 x chest press HS curl 25# 2 sets 10 Knee ext 10# 2 sets 10 Black tband 2 sets 10 flex and ext Seated row 20# 2 sets 10 Resisted gait 4 way 5 x each 20#   03/21/24 Walk outside 4 min working on cadence and stride. 1 standing rest break Resisted gait 4 way 5 x each 20# and did without LOB at lower resistance More square stepping with HHA.more fear of stepping and falling Nustep L 5 10 min STS with wt ball chest press Standing on airex ball toss CGA  03/16/24 Nustep L 5 10 min BERG 42/56 STS 14.4 sec 5 x Foam beam in // bars 5 x tandem fwd and back,laterally Resisted gait 30# 5 x fwd and 5 x backward, 3 x each side- struggled with lateral mvmt  03/14/24 Nustep L 5 10 min Amb 6 min outside CGA no AD working on cadence and confidence. HHS with step ups on curbs and CGA with step down with encouragement, very fearful. Decreased balance with decline and struggled with incline and increased LBP d/t fwd flex, last 45 sec needed HHA d/t fatigue Cable pulleys 10# standing row 10x then shld ext 10x Seated row 2 sets 10 Lat pull down 2 sets 10 STS with 4# chest press 10 x 4# standing chest press and OH press 10 x   03/10/24 Nustep L 5 10 min STS wt ball chest press 10 x 7# farmers carry 1 lap each hand Step up with opp leg ext  10 x each with UE Step up laterally 10 x each with UE 30# 3 x fwd and backward Standing tband shld ext and row 2 sets 10 Standing tband hip ext and abd 2 sets 10  03/07/24 Nustep L 5 10 min Seated roling ball out for back stretch Seated resisted trunk ext Standing tband shld ext and row 2 sets 10 Standing tband hip ext and abd 2 sets 10  02/29/24 Nustep L 6 10 min Stepping over various heights and directions- HHA with LOB STS with wt ball  chest press 10x Farmers carry 7# 1 lap each hand Resisted trunk ext 20 x HHA red tband SL hip flex,ext and abd 10 x Green tband shld ext and row 2 sets 10     02/22/24 Nustep L 6 10 min Resisted trunk ext 20 x Feet on ball bride, KTC, obl 10 x Iso abdominals 10 x hold 3 sec Green tband clams and hip flex 2 sets 10 Gentle PROM LE and trunk Red tband standing shld ext and row 2 sets 10  02/17/24 Nustep L 6 10 min In // bars foam beam fwd and back tandem 3 x then side stepping 3 x , light HHA STS from standard chair 2 sets 5 cg-min A 6 inch alt step tap 2 sets 10- LOB 2 not clearing foot  20# resisted gait fwd and back 5 x , then 3 x each side CG-min A with 2 LOB Aura carry 6#  02/15/24 Nustep L 6 10 min STS with wt ball chest press 10 x Resisted trunk flex and ext 20 x Seated row 20# 2 sets 10 Knee ext 5# 2 sets 10 HS curl 20# 2 sets 10 Leg press 20# 3 sets 10   02/10/24 Nustep L 6 10 min STS wt ball chest press 10 x Knee ext 5# 2 sets 10 HS curl 20# 2 sets 10 Leg press 20# 3 sets 10 Tband standing shld ext and row 2 sets 10 Tband seated trunk flex and ext  02/08/24 Amb outside 300 feet with curb sup-CGA without AD with cuing to increase stride as she was taking short shuffle steps esp as she fatigued Nustep L 6 10 min STS wt ball CGA 10 x Light HHA vectro taps Light HHA stepping fwd and back over 2 in pole 3# HHA marching fwd and back 20 feet 2 x 3# HHA side stepping 20 feet 2 x  02/03/24 Nustep L 5 10 min BERG   36/56 Ball toss on airex CGA with 2 LOB STS from elevated mat 2 sets 5 on airex min A Obstacle course stepping on over and around various objects 6 in step taps 10 x each leg then alt CG-min A      02/01/24 Nustep L 5 10 min Amb HHA 5 min outside working on upright posture and cadence Trunk flex and ext 20 x blue tband STS wt ball 10x  HHA on airex stepping on and off fwd,SW and back 10 x each HS curl 25# 2 sets 10 Knee ext 5# 2 sets  10     01/25/24 Nustep L 5 STS wt ball CGA 10 x, cued to not use legs to brace on mat Standing on airex CGA shld ext and row 10 x Standing on airex HHA alt LE 20 x marching,SL hip flex,ext and abd Standing HHA marching fwd and back 20 feet 2 x each and side stepping 20 feet 2 x each Seated resisted trunk flex and  ext 20 x Amb 500 feet HHA working on upright trunk, and cadence and she did very well- 2 brief standing rest breaks   01/18/24 Nustep L 5 Goals assessed and documented Knee ext 5# 2 set s10 HS curl 15# 2 sets 10 Resisted seated trunk ext 2 sets 10 Seated row 20# 10 x  15# 10 x 3# ankle wts HHA marcing fwd and back ward 15 feet 3 x each, side stepping 15 feet 3 x each 3# ankle wt HHA 10 x each SL hip flex,ext and abd  12/30/23 Nustep L 5 Goals assessed and documented STS 5x 16.54 sec Foam beam in // bars 3x side stepping and then tandem fwd and back CGA with light HHA STS with ball toss 10 x Ball toss on airex, only 1 LOB 2 sets 10 Knee ext 5# 2 set s10 HS curl 15# 2 sets 10 [employer] 5# 1 lap each hand Resisted seated trunk ext 2 sets 10 Seated row 15# 2 sets 10     12/13/23 Nustep L 5 8 min  Knee ext 5# 2 set s10 HS curl 15# 2 sets 10 STS with 4# chest press 10x ADD ball squeeze 2x15 Hip abd w/annual resistance 2x15 Alt 4in box taps CGA 2x10  12/10/23 STS with 4# chest press 10x Assessed and documented goals Nustep L 5 Resisted gait 20# 3 x 4 ways- min A Knee ext 5# 2 set s10 HS curl 15# 2 sets 10  12/07/23 Resisted trunk flex and ext 20 x Seated resisted row and shld ext 2 sets 10 Nustep L 5 8 min Green tband HS curl 2 sets 10 3# LAQ 2 sets 10 3# HHA SL hip flex,ext and abd 10 x each- decreased RT LE mvmt ADD ball squeeze 15 x    12/02/23 Nustep L 5 BM for vacuuming to decrease strain on back Green tband HS curl,hip flex and clams seated 2 sets 10 ADD ball squeeze 2 sets 10 3# LAQ 2 sets 10 Red tband seated row  and shld ext 2 sets 10 Tband trunk ext 2 sets 10 Standing with RW 3# hip ext and 10 x each      09/20/23:   Evaluation, explained to pt that long term management of arthritic changes in hips in spine, should respond well to moderate exercise. Practiced gait with st cane, pt without any marked improvement in her stability with the use of the cane.  Therefore practiced with front wheeled walker, marked improvement in gait pattern . Recommended that she use a front wheeled walker which is more light weight and easier to maneuver in and out of car for outings, she is a high fall risk.  PATIENT EDUCATION:  Education details: POC, goals Person educated: Patient Education method: Explanation, Demonstration, Tactile cues, and Verbal cues Education comprehension: verbalized understanding and returned demonstration  HOME EXERCISE PROGRAM: Green t band hip abd/ER in sitting and green t band ankle pumps L .   ASSESSMENT:  CLINICAL IMPRESSION:   pt arrived amb with AD but increased fwd flexion and c/o increased back pain 7/10 but it is later in the day. Struggled eccentrically with lateral stepping with min A needed. Pnt continues to be fearful of falling with stepping over and on objects and struggles with those moments of SLS   OBJECTIVE IMPAIRMENTS: decreased activity tolerance, decreased balance, decreased knowledge of condition, decreased knowledge of use of DME, decreased mobility, difficulty walking, decreased ROM, decreased strength, decreased safety  awareness, impaired perceived functional ability, postural dysfunction, and pain.   ACTIVITY LIMITATIONS: carrying, lifting, bending, standing, squatting, transfers, bed mobility, and locomotion level  PARTICIPATION LIMITATIONS: meal prep, cleaning, laundry, shopping, community activity, and yard work  PERSONAL FACTORS: Age, Behavior pattern, Fitness, Past/current experiences, Time since onset of injury/illness/exacerbation, and 1-2  comorbidities: severe DJD B hips, lumbar region, neuropathy are also affecting patient's functional outcome.   REHAB POTENTIAL: Good  CLINICAL DECISION MAKING: Evolving/moderate complexity  EVALUATION COMPLEXITY: Moderate   GOALS: Goals reviewed with patient? Yes  SHORT TERM GOALS: Target date: 2 weeks 5 5/25 I HEP Baseline: Goal status: MET 10/26/23   LONG TERM GOALS: Target date:   Modified Oswestry Low Back Pain Disability Questionnaire: 29 / 50 = 58.0 %  Baseline:  Goal status: 11/11/23 progressing  and 11/18/23  11/30/23 progressing 12/10/23 progressing   12/30/23 progressing   01/18/24 progressing 02/10/24 MET  2.  Safe gait with appropriate device , over 350' for safe household distances, without balance loss REVISED GOAL: 500+ ft  Baseline:  Goal status: on going 10/26/23  progressing 11/04/23  and 11/11/23  Progressing 11/18/23  progressing 11/30/23 distance met but unsteady  12/10/23  amb without AD 250 feet, no LOB  but instability with lateral sway 12/30/23 progressing 250 feet then slows down and balance decreases 01/18/24 progressing  Progressing and showing good improvements 02/01/24 02/10/24 progressing and 02/17/24. Progressing 03/10/24  03/16/24 met for distance but still moments of unsteadiness  03/23/24 progressing 04/04/24 MET  3.  5 x sit to stand 14.8 sec or less without balance loss REVISED GOAL: < 13s Baseline: 25 sec,with UE support B  required mod assist on final rep to avoid fall  Goal status: progressing 10/26/23  progressing 11/05/23  11/18/23 multi tries and posterior lean,progressing  11/30/23 LOB progressing  12/10/23 20 sec 12/30/23 progressing   01/18/24 18.5 sec with 1 x bracing knees 02/01/24 progressing uses knees to brace with 02/10/24 16 sec progressing  03/10/24 15 sec with no LOB or hesitation 03/16/24 14.4 sec MET    4. Will increase BERG score to 45 to demonstrate improved balance   Baseline: 36 REVISED GOAL: 48  Goal status: progressing 02/17/24  and  02/29/24 03/16/24 42/56 progressing  04/04/24    PLAN:  PT FREQUENCY: 2x/week  PT DURATION: 12 weeks  PLANNED INTERVENTIONS: 97110-Therapeutic exercises, 97530- Therapeutic activity, 97112- Neuromuscular re-education, 97535- Self Care, [id number]- Manual therapy, [id number]- Electrical stimulation (unattended), and 97033- Ionotophoresis 4mg /ml Dexamethasone   PLAN FOR NEXT SESSION:  progress balance and gait Daryl Beehler,ANGIE, PTA,  04/06/2024, 2:39 PM [facility] [place], [place], [address] Phone: [phone]   Fax:  [phone]

## 2024-04-09 NOTE — Progress Notes (Deleted)
 [facility] [address], [address] [place], [place] [address] [phone]  Date:  04/12/2024   Name:  Margaret [name]   DOB:  12-Oct-1940   MRN:  [id number]  PCP:  Watt Harlene BROCKS, MD    Chief Complaint: No chief complaint on file.   History of Present Illness:  Margaret [name] is a 83 y.o. very pleasant female patient who presents with the following:  Got a follow-up.  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  I encouraged her to keep taking it but of course she is able to stop if she wishes to do so  She has chronic arthritis pain treated with Tylenol  and hydrocodone  as needed Flu shot up-to-date She has completed Shingrix and pneumonia vaccination Recommend 1 dose of RSV  Recommend COVID booster  Gabapentin  300 3 times daily Toprol -XL 25 Pravachol  20 HCTZ 12.5  Patient Active Problem List   Diagnosis Date Noted   Renal mass 06/03/2022   Open wound of left side of back 01/20/2021   [place] 11/18/2020   Lipoma of back 11/18/2020   Lymphedema 11/18/2020   Melanocytic nevi of right upper limb, including shoulder 11/18/2020   Personal history of malignant melanoma of skin 11/18/2020   Sebaceous cyst 11/18/2020   Seborrheic dermatitis 11/18/2020   Pre-diabetes 04/03/2020   OA (osteoarthritis) of knee 11/13/2019   Total knee replacement status, 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   Pain in right knee 08/03/2019   Fluid collection at surgical site 10/20/2018   Encounter for postoperative examination after surgery for malignant neoplasm 09/30/2018   Malignant melanoma  of left foot (HCC) 08/15/2018   Plantar fasciitis of right foot 02/03/2017   Porokeratosis 02/03/2017   Acute gallstone pancreatitis    Calculus of gallbladder with acute cholecystitis and obstruction    LFT elevation    Stress fracture 01/01/2016   Insomnia 12/16/2015   Peripheral neuropathy 10/18/2015   Renal insufficiency 03/06/2015   HTN (hypertension) 03/06/2015   Osteopenia 03/06/2015   GERD (gastroesophageal reflux disease) 03/06/2015   Vaginal pessary present 03/06/2015   Metatarsalgia 07/13/2012   Increased frequency of urination 04/13/2012   DDD (degenerative disc disease), cervical 01/19/2012   Knee stiffness 01/19/2012   Low back pain 01/19/2012   Neck pain 01/19/2012   Pain of hand 01/19/2012   Hallux valgus 09/20/2011   Hyperkeratosis 08/13/2011   Anxiety disorder 07/06/2011   Depression 07/06/2011   Diverticulosis of large intestine 07/06/2011   Non-toxic multinodular goiter 07/06/2011   Atrophic vaginitis 10/24/2010   Dermatographic urticaria 09/17/2010   Inflamed seborrheic keratosis 09/17/2010   Closed fracture of fifth metatarsal bone 07/10/2010   Diagnosis unknown 02/27/2010   Hip pain 06/08/2008   Pulmonary nodule seen on imaging study 06/08/2008    Past Medical History:  Diagnosis Date   Anxiety    Arthritis    Cancer (HCC)    lt. foot melanoma   Chronic kidney disease    lt. kidney cyst   Coronary artery disease    GERD (gastroesophageal reflux disease)    History of healed stress fracture 2013   bilateral feet   Hx of phlebitis  reports remote hx of superfical blood clots never anticoagulated   Hypertension    Osteopenia    Pneumonia     Past Surgical History:  Procedure Laterality Date   CATARACT EXTRACTION W/ INTRAOCULAR LENS IMPLANT Bilateral    CHOLECYSTECTOMY N/A 09/19/2016   Procedure: LAPAROSCOPIC CHOLECYSTECTOMY POSSIBLE INTRAOPERATIVE CHOLANGIOGRAM;  Surgeon: Donnice Bury, MD;  Location: [facility] OR;  Service: General;   Laterality: N/A;   ERCP N/A 09/18/2016   Procedure: ENDOSCOPIC RETROGRADE CHOLANGIOPANCREATOGRAPHY (ERCP);  Surgeon: Victory LITTIE Legrand DOUGLAS, MD;  Location: [facility] ENDOSCOPY;  Service: Endoscopy;  Laterality: N/A;   FOOT SURGERY     patient reports four foot surgeries   FOOT SURGERY Left    bunion, hammer toe surgery   FOOT SURGERY Right    shaved bunion, hammer toe correction   HERNIA REPAIR     KNEE ARTHROSCOPY Right 06/02/1995   REPLACEMENT TOTAL KNEE Left    REPLACEMENT TOTAL KNEE Left 10/30/2012   RETINAL DETACHMENT SURGERY     RETINAL DETACHMENT SURGERY Left 06/01/2006   ROBOTIC ASSITED PARTIAL NEPHRECTOMY Left 06/03/2022   Procedure: XI ROBOTIC ASSITED PARTIAL NEPHRECTOMY WITH ULTRASOUND AND VENTRAL HERNIA REPAIR;  Surgeon: Alvaro Hummer, MD;  Location: [facility] ORS;  Service: Urology;  Laterality: Left;  3 HRS   THYROIDECTOMY, PARTIAL Right    THYROIDECTOMY, PARTIAL  06/02/2003   TOTAL KNEE ARTHROPLASTY Right 11/13/2019   Procedure: TOTAL KNEE ARTHROPLASTY SDDC;  Surgeon: Melodi Lerner, MD;  Location: [facility] ORS;  Service: Orthopedics;  Laterality: Right;     Social History   Tobacco Use   Smoking status: Former    Current packs/day: 0.00    Average packs/day: 0.3 packs/day for 15.0 years (3.8 ttl pk-yrs)    Types: Cigarettes    Start date: 06/01/1964    Quit date: 06/02/1979    Years since quitting: 44.8   Smokeless tobacco: Never  Vaping Use   Vaping status: Never Used  Substance Use Topics   Alcohol use: Yes    Alcohol/week: 1.0 standard drink of alcohol    Types: 1 Glasses of wine per week    Comment: Occ   Drug use: No    Family History  Problem Relation Age of Onset   Heart disease Mother    Heart attack Mother    Heart disease Father    Diabetes Maternal Grandmother    Cancer Paternal Grandfather        stomach    Allergies  Allergen Reactions   Keflex  [Cephalexin ] Diarrhea   Percocet [Oxycodone -Acetaminophen ] Other (See Comments)    Syncope (passed out)    Clindamycin /Lincomycin Rash    Medication list has been reviewed and updated.  Current Outpatient Medications on File Prior to Visit  Medication Sig Dispense Refill   acetaminophen  (TYLENOL ) 500 MG tablet 1000 mg by oral route.     diclofenac Sodium (VOLTAREN) 1 % GEL Apply 2 g topically 4 (four) times daily as needed (pain).     gabapentin  (NEURONTIN ) 300 MG capsule TAKE 1 CAPSULE BY MOUTH 3 TIMES A DAY 90 capsule 3   hydrochlorothiazide  (HYDRODIURIL ) 12.5 MG tablet Take 1 tablet (12.5 mg total) by mouth daily as needed. 90 tablet 0   HYDROcodone -acetaminophen  (NORCO) 7.5-325 MG tablet Take 1 tablet by mouth every 8 (eight) hours as needed for moderate pain (pain score 4-6). 45 tablet 0   ketoconazole (NIZORAL) 2 % cream APPLY TO THE AFFECTED AREA EVERY DAY FOR 15 DAYS     metoprolol  succinate (TOPROL -XL) 25 MG 24  hr tablet Take 1 tablet (25 mg total) by mouth daily. TAKE WITH OR IMMEDIATELY FOLLOWING A MEAL. 90 tablet 1   NONFORMULARY OR COMPOUNDED ITEM [facility]:  Peripheral Neuropathy - Bupivacaine  1%, Doxepin 3%, Gabapentin  6%, Pentoxifylline 3%, Topiramate 1%. Apply 1-2 grams to affected area 3-4 times daily. 60 each 3   omeprazole  (PRILOSEC) 20 MG capsule Take 1 capsule (20 mg total) by mouth daily. 90 capsule 1   pravastatin  (PRAVACHOL ) 20 MG tablet TAKE 1 TABLET BY MOUTH DAILY 90 tablet 1   No current facility-administered medications on file prior to visit.    Review of Systems:  As per HPI- otherwise negative.   Physical Examination: There were no vitals filed for this visit. There were no vitals filed for this visit. There is no height or weight on file to calculate BMI. Ideal Body Weight:    GEN: no acute distress. HEENT: Atraumatic, Normocephalic.  Ears and Nose: No external deformity. CV: RRR, No M/G/R. No JVD. No thrill. No extra heart sounds. PULM: CTA B, no wheezes, crackles, rhonchi. No retractions. No resp. distress. No accessory muscle use. ABD: S,  NT, ND, +BS. No rebound. No HSM. EXTR: No c/c/e PSYCH: Normally interactive. Conversant.    Assessment and Plan: No diagnosis found.  Assessment & Plan   Signed Harlene Schroeder, MD

## 2024-04-11 ENCOUNTER — Ambulatory Visit: Admitting: Physical Therapy

## 2024-04-11 DIAGNOSIS — G8929 Other chronic pain: Secondary | ICD-10-CM | POA: Diagnosis not present

## 2024-04-11 DIAGNOSIS — M5441 Lumbago with sciatica, right side: Secondary | ICD-10-CM | POA: Diagnosis not present

## 2024-04-11 DIAGNOSIS — M5442 Lumbago with sciatica, left side: Secondary | ICD-10-CM | POA: Diagnosis not present

## 2024-04-11 DIAGNOSIS — R293 Abnormal posture: Secondary | ICD-10-CM | POA: Diagnosis not present

## 2024-04-11 DIAGNOSIS — R262 Difficulty in walking, not elsewhere classified: Secondary | ICD-10-CM | POA: Diagnosis not present

## 2024-04-11 NOTE — Therapy (Signed)
 OUTPATIENT PHYSICAL THERAPY LOWER EXTREMITY .         Patient Name: Margaret [name] MRN: [id number] DOB:1940/10/05, 83 y.o., female Today's Date: 04/11/2024  END OF SESSION:  PT End of Session - 04/11/24 1446     Visit Number 35    Date for Recertification  05/05/24    PT Start Time 1445    PT Stop Time 1530    PT Time Calculation (min) 45 min            Past Medical History:  Diagnosis Date   Anxiety    Arthritis    Cancer (HCC)    lt. foot melanoma   Chronic kidney disease    lt. kidney cyst   Coronary artery disease    GERD (gastroesophageal reflux disease)    History of healed stress fracture 2013   bilateral feet   Hx of phlebitis    reports remote hx of superfical blood clots never anticoagulated   Hypertension    Osteopenia    Pneumonia    Past Surgical History:  Procedure Laterality Date   CATARACT EXTRACTION W/ INTRAOCULAR LENS IMPLANT Bilateral    CHOLECYSTECTOMY N/A 09/19/2016   Procedure: LAPAROSCOPIC CHOLECYSTECTOMY POSSIBLE INTRAOPERATIVE CHOLANGIOGRAM;  Surgeon: Donnice Bury, MD;  Location: [facility] OR;  Service: General;  Laterality: N/A;   ERCP N/A 09/18/2016   Procedure: ENDOSCOPIC RETROGRADE CHOLANGIOPANCREATOGRAPHY (ERCP);  Surgeon: Victory LITTIE Legrand DOUGLAS, MD;  Location: [facility] ENDOSCOPY;  Service: Endoscopy;  Laterality: N/A;   FOOT SURGERY     patient reports four foot surgeries   FOOT SURGERY Left    bunion, hammer toe surgery   FOOT SURGERY Right    shaved bunion, hammer toe correction   HERNIA REPAIR     KNEE ARTHROSCOPY Right 06/02/1995   REPLACEMENT TOTAL KNEE Left    REPLACEMENT TOTAL KNEE Left 10/30/2012   RETINAL DETACHMENT SURGERY     RETINAL DETACHMENT SURGERY Left 06/01/2006   ROBOTIC ASSITED PARTIAL NEPHRECTOMY Left 06/03/2022   Procedure: XI ROBOTIC ASSITED PARTIAL NEPHRECTOMY WITH ULTRASOUND AND VENTRAL HERNIA REPAIR;  Surgeon: Alvaro Hummer, MD;  Location: [facility] ORS;  Service: Urology;  Laterality: Left;  3 HRS    THYROIDECTOMY, PARTIAL Right    THYROIDECTOMY, PARTIAL  06/02/2003   TOTAL KNEE ARTHROPLASTY Right 11/13/2019   Procedure: TOTAL KNEE ARTHROPLASTY SDDC;  Surgeon: Melodi Lerner, MD;  Location: [facility] ORS;  Service: Orthopedics;  Laterality: Right;    Patient Active Problem List   Diagnosis Date Noted   Renal mass 06/03/2022   Open wound of left side of back 01/20/2021   Lipoma of back 11/18/2020   Lymphedema 11/18/2020   Melanocytic nevi of right upper limb, including shoulder 11/18/2020   Personal history of malignant melanoma of skin 11/18/2020   Sebaceous cyst 11/18/2020   Seborrheic dermatitis 11/18/2020   Pre-diabetes 04/03/2020   OA (osteoarthritis) of knee 11/13/2019   Total knee replacement status, 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   Pain in right knee 08/03/2019   Fluid collection at surgical site 10/20/2018   Encounter for postoperative examination after surgery for malignant neoplasm 09/30/2018   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/2018   Plantar fasciitis of right foot 02/03/2017   Porokeratosis 02/03/2017   Acute gallstone pancreatitis    Calculus of gallbladder with acute cholecystitis and obstruction    LFT elevation    Stress fracture 01/01/2016   Insomnia 12/16/2015   Peripheral neuropathy 10/18/2015   Renal insufficiency 03/06/2015   HTN (hypertension) 03/06/2015   Osteopenia 03/06/2015  GERD (gastroesophageal reflux disease) 03/06/2015   Vaginal pessary present 03/06/2015   Metatarsalgia 07/13/2012   Increased frequency of urination 04/13/2012   DDD (degenerative disc disease), cervical 01/19/2012   Knee stiffness 01/19/2012   Low back pain 01/19/2012   Neck pain 01/19/2012   Pain of hand 01/19/2012   Hallux valgus 09/20/2011   Hyperkeratosis 08/13/2011   Anxiety disorder 07/06/2011   Depression 07/06/2011   Diverticulosis of large intestine 07/06/2011   Non-toxic multinodular goiter 07/06/2011   Atrophic vaginitis 10/24/2010   Dermatographic urticaria  09/17/2010   Inflamed seborrheic keratosis 09/17/2010   Closed fracture of fifth metatarsal bone 07/10/2010   Diagnosis unknown 02/27/2010   Hip pain 06/08/2008   Pulmonary nodule seen on imaging study 06/08/2008    PCP: Watt Raisin, MD  REFERRING PROVIDER: Charlie Dolores, MD, [facility]  REFERRING DIAG: degeneration of discs of lumbar spine  THERAPY DIAG:  Chronic bilateral low back pain with right-sided sciatica  Difficulty in walking, not elsewhere classified  Posture imbalance  Chronic bilateral low back pain with left-sided sciatica  Rationale for Evaluation and Treatment: Rehabilitation  ONSET DATE: chronic  SUBJECTIVE:   SUBJECTIVE STATEMENT   getting stomach pains when I eat,need to see MD. Back is painful - dust and 2 loads of laundry,getting epidural inj 12/2  PERTINENT HISTORY: Saw orthopedist, took prednisone, which didn't help, orthopedist may want to do MRI, referred to PT by both PCP and orthopedist, also B degenerative arthritis hips   PAIN:  Are you having pain? 7/10 back  PRECAUTIONS: Fall  RED FLAGS: None   WEIGHT BEARING RESTRICTIONS: No  FALLS:  Has patient fallen in last 6 months? Yes. Number of falls several  LIVING ENVIRONMENT: Lives with: lives with their family and lives with their daughter Lives in: House/apartment Stairs: Yes: Internal: one flight steps; on left going up Has following equipment at home: Environmental Consultant - 4 wheeled  OCCUPATION: retired  PLOF: Independent and Independent with basic ADLs  PATIENT GOALS: reduce pain hips and lower back so I can tolerate more activity  NEXT MD VISIT: not scheduled yet  OBJECTIVE:  Note: Objective measures were completed at Evaluation unless otherwise noted.  DIAGNOSTIC FINDINGS: X-ray done 11/23; IMPRESSION: 1. 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 2. Lower lumbar spondylosis.  PATIENT SURVEYS:  Modified Oswestry Modified Oswestry Low Back  Pain Disability Questionnaire: 29 / 50 = 58.0 %   COGNITION: Overall cognitive status: Within functional limits for tasks assessed     SENSATION: Light touch: WFL  EDEMA:  None noted   MUSCLE LENGTH: Hamstrings: Right wfl deg; Left wfl deg Debby test: Right wfl deg; Left wfl deg  POSTURE: scoliotic posture noted concavity L lumbar, pronounced thoracic kyphosis  PALPATION: Not tender with light palpation B gr trochanters hips  LOWER EXTREMITY ROM:B hip ROM wnl, able to achieve tailor position each leg in sitting  LUMBAR ROM: FB limited 15%, also risk of falling pt needed UE support Lumbar ext, 25% with marked side bending to L with this motion SB B 35% B with central lower back pain LOWER EXTREMITY MMT:  MMT Right eval Left eval  Hip flexion    Hip extension    Hip abduction    Hip adduction    Hip internal rotation    Hip external rotation    Knee flexion    Knee extension    Ankle dorsiflexion    Ankle plantarflexion    Ankle inversion  Ankle eversion     (Blank rows = not tested)   FUNCTIONAL TESTS:  5 times sit to stand: 25 sec, required mod assist of PT to avoid falling backwards into chair with final attempt  GAIT: Distance walked: in clinic up to 60  Assistive device utilized: Single point cane, Walker - 2 wheeled, and none Level of assistance: Min A without device and with cane Comments: very unstable without device                                                                                                                                 TREATMENT DATE:   04/11/24 Nustep L 5 10 min Black tband trunk flexion and ext 20 x Seated row 20# 2 sets 10 STS on airex 10 x- CGA Walking ball toss fwd and laterally 6 inch alt step taps 20 x 2 sets 6 inch step ups 10 x BIL with UE support Resisted gait fwd and back 5 x Resisted gait with side stepping over 1 inch stick  04/06/24 Resisted gait 30 # 5 x fwd and back CGA, laterally min a with  decreased eccentric control 3 x each Nustep L 5 10 min 6 inch alt step taps 20 x 2 sets 6 inch step ups 10 x BIL with UE support Kicking ball min A Ball toss on airex- CGA 1 LOB sitting back on mat Min a vector taps HS curl 20# 2 sets 10 Knee ext 10# 2 sets 10 STS wt ball 10 x  04/04/24 BERG 45/56 administered and assessed other goals Nustep L 5 10 min 3# single HHS 6 inch step tap fwd and laterally BIL 3# HHA marching fwd and back 15 feet 3 x then laterally 3# stepping fwd  over foam beams with min A 3# laterally stepping over foam beam min A  03/23/24 Nustep L 5 10 min STS with wt ball 10 x chest press HS curl 25# 2 sets 10 Knee ext 10# 2 sets 10 Black tband 2 sets 10 flex and ext Seated row 20# 2 sets 10 Resisted gait 4 way 5 x each 20#   03/21/24 Walk outside 4 min working on cadence and stride. 1 standing rest break Resisted gait 4 way 5 x each 20# and did without LOB at lower resistance More square stepping with HHA.more fear of stepping and falling Nustep L 5 10 min STS with wt ball chest press Standing on airex ball toss CGA  03/16/24 Nustep L 5 10 min BERG 42/56 STS 14.4 sec 5 x Foam beam in // bars 5 x tandem fwd and back,laterally Resisted gait 30# 5 x fwd and 5 x backward, 3 x each side- struggled with lateral mvmt  03/14/24 Nustep L 5 10 min Amb 6 min outside CGA no AD working on cadence and confidence. HHS with step ups on curbs and CGA with step down with encouragement, very fearful. Decreased balance with decline and  struggled with incline and increased LBP d/t fwd flex, last 45 sec needed HHA d/t fatigue Cable pulleys 10# standing row 10x then shld ext 10x Seated row 2 sets 10 Lat pull down 2 sets 10 STS with 4# chest press 10 x 4# standing chest press and OH press 10 x   03/10/24 Nustep L 5 10 min STS wt ball chest press 10 x 7# farmers carry 1 lap each hand Step up with opp leg ext 10 x each with UE Step up laterally 10 x each with UE 30# 3  x fwd and backward Standing tband shld ext and row 2 sets 10 Standing tband hip ext and abd 2 sets 10  03/07/24 Nustep L 5 10 min Seated roling ball out for back stretch Seated resisted trunk ext Standing tband shld ext and row 2 sets 10 Standing tband hip ext and abd 2 sets 10  02/29/24 Nustep L 6 10 min Stepping over various heights and directions- HHA with LOB STS with wt ball chest press 10x Farmers carry 7# 1 lap each hand Resisted trunk ext 20 x HHA red tband SL hip flex,ext and abd 10 x Green tband shld ext and row 2 sets 10     02/22/24 Nustep L 6 10 min Resisted trunk ext 20 x Feet on ball bride, KTC, obl 10 x Iso abdominals 10 x hold 3 sec Green tband clams and hip flex 2 sets 10 Gentle PROM LE and trunk Red tband standing shld ext and row 2 sets 10  02/17/24 Nustep L 6 10 min In // bars foam beam fwd and back tandem 3 x then side stepping 3 x , light HHA STS from standard chair 2 sets 5 cg-min A 6 inch alt step tap 2 sets 10- LOB 2 not clearing foot  20# resisted gait fwd and back 5 x , then 3 x each side CG-min A with 2 LOB Aura carry 6#  02/15/24 Nustep L 6 10 min STS with wt ball chest press 10 x Resisted trunk flex and ext 20 x Seated row 20# 2 sets 10 Knee ext 5# 2 sets 10 HS curl 20# 2 sets 10 Leg press 20# 3 sets 10   02/10/24 Nustep L 6 10 min STS wt ball chest press 10 x Knee ext 5# 2 sets 10 HS curl 20# 2 sets 10 Leg press 20# 3 sets 10 Tband standing shld ext and row 2 sets 10 Tband seated trunk flex and ext  02/08/24 Amb outside 300 feet with curb sup-CGA without AD with cuing to increase stride as she was taking short shuffle steps esp as she fatigued Nustep L 6 10 min STS wt ball CGA 10 x Light HHA vectro taps Light HHA stepping fwd and back over 2 in pole 3# HHA marching fwd and back 20 feet 2 x 3# HHA side stepping 20 feet 2 x  02/03/24 Nustep L 5 10 min BERG   36/56 Ball toss on airex CGA with 2 LOB STS from elevated mat 2  sets 5 on airex min A Obstacle course stepping on over and around various objects 6 in step taps 10 x each leg then alt CG-min A      02/01/24 Nustep L 5 10 min Amb HHA 5 min outside working on upright posture and cadence Trunk flex and ext 20 x blue tband STS wt ball 10x  HHA on airex stepping on and off fwd,SW and back 10 x each HS curl  25# 2 sets 10 Knee ext 5# 2 sets 10     01/25/24 Nustep L 5 STS wt ball CGA 10 x, cued to not use legs to brace on mat Standing on airex CGA shld ext and row 10 x Standing on airex HHA alt LE 20 x marching,SL hip flex,ext and abd Standing HHA marching fwd and back 20 feet 2 x each and side stepping 20 feet 2 x each Seated resisted trunk flex and ext 20 x Amb 500 feet HHA working on upright trunk, and cadence and she did very well- 2 brief standing rest breaks   01/18/24 Nustep L 5 Goals assessed and documented Knee ext 5# 2 set s10 HS curl 15# 2 sets 10 Resisted seated trunk ext 2 sets 10 Seated row 20# 10 x  15# 10 x 3# ankle wts HHA marcing fwd and back ward 15 feet 3 x each, side stepping 15 feet 3 x each 3# ankle wt HHA 10 x each SL hip flex,ext and abd  12/30/23 Nustep L 5 Goals assessed and documented STS 5x 16.54 sec Foam beam in // bars 3x side stepping and then tandem fwd and back CGA with light HHA STS with ball toss 10 x Ball toss on airex, only 1 LOB 2 sets 10 Knee ext 5# 2 set s10 HS curl 15# 2 sets 10 [employer] 5# 1 lap each hand Resisted seated trunk ext 2 sets 10 Seated row 15# 2 sets 10     12/13/23 Nustep L 5 8 min  Knee ext 5# 2 set s10 HS curl 15# 2 sets 10 STS with 4# chest press 10x ADD ball squeeze 2x15 Hip abd w/annual resistance 2x15 Alt 4in box taps CGA 2x10  12/10/23 STS with 4# chest press 10x Assessed and documented goals Nustep L 5 Resisted gait 20# 3 x 4 ways- min A Knee ext 5# 2 set s10 HS curl 15# 2 sets 10  12/07/23 Resisted trunk flex and ext 20 x Seated  resisted row and shld ext 2 sets 10 Nustep L 5 8 min Green tband HS curl 2 sets 10 3# LAQ 2 sets 10 3# HHA SL hip flex,ext and abd 10 x each- decreased RT LE mvmt ADD ball squeeze 15 x    12/02/23 Nustep L 5 BM for vacuuming to decrease strain on back Green tband HS curl,hip flex and clams seated 2 sets 10 ADD ball squeeze 2 sets 10 3# LAQ 2 sets 10 Red tband seated row and shld ext 2 sets 10 Tband trunk ext 2 sets 10 Standing with RW 3# hip ext and 10 x each      09/20/23:   Evaluation, explained to pt that long term management of arthritic changes in hips in spine, should respond well to moderate exercise. Practiced gait with st cane, pt without any marked improvement in her stability with the use of the cane.  Therefore practiced with front wheeled walker, marked improvement in gait pattern . Recommended that she use a front wheeled walker which is more light weight and easier to maneuver in and out of car for outings, she is a high fall risk.  PATIENT EDUCATION:  Education details: POC, goals Person educated: Patient Education method: Explanation, Demonstration, Tactile cues, and Verbal cues Education comprehension: verbalized understanding and returned demonstration  HOME EXERCISE PROGRAM: Green t band hip abd/ER in sitting and green t band ankle pumps L .   ASSESSMENT:  CLINICAL IMPRESSION:  pt arrived with some stomach issues . Back pan after dusting and 2 loads of laundry. Progressed strengthening with postural cuing and core engagement. Pnt scheduled for epidural 12/2   OBJECTIVE IMPAIRMENTS: decreased activity tolerance, decreased balance, decreased knowledge of condition, decreased knowledge of use of DME, decreased mobility, difficulty walking, decreased ROM, decreased strength, decreased safety awareness, impaired perceived functional ability, postural dysfunction, and pain.   ACTIVITY LIMITATIONS: carrying, lifting, bending, standing, squatting, transfers,  bed mobility, and locomotion level  PARTICIPATION LIMITATIONS: meal prep, cleaning, laundry, shopping, community activity, and yard work  PERSONAL FACTORS: Age, Behavior pattern, Fitness, Past/current experiences, Time since onset of injury/illness/exacerbation, and 1-2 comorbidities: severe DJD B hips, lumbar region, neuropathy are also affecting patient's functional outcome.   REHAB POTENTIAL: Good  CLINICAL DECISION MAKING: Evolving/moderate complexity  EVALUATION COMPLEXITY: Moderate   GOALS: Goals reviewed with patient? Yes  SHORT TERM GOALS: Target date: 2 weeks 5 5/25 I HEP Baseline: Goal status: MET 10/26/23   LONG TERM GOALS: Target date:   Modified Oswestry Low Back Pain Disability Questionnaire: 29 / 50 = 58.0 %  Baseline:  Goal status: 11/11/23 progressing  and 11/18/23  11/30/23 progressing 12/10/23 progressing   12/30/23 progressing   01/18/24 progressing 02/10/24 MET  2.  Safe gait with appropriate device , over 350' for safe household distances, without balance loss REVISED GOAL: 500+ ft  Baseline:  Goal status: on going 10/26/23  progressing 11/04/23  and 11/11/23  Progressing 11/18/23  progressing 11/30/23 distance met but unsteady  12/10/23  amb without AD 250 feet, no LOB  but instability with lateral sway 12/30/23 progressing 250 feet then slows down and balance decreases 01/18/24 progressing  Progressing and showing good improvements 02/01/24 02/10/24 progressing and 02/17/24. Progressing 03/10/24  03/16/24 met for distance but still moments of unsteadiness  03/23/24 progressing 04/04/24 MET  3.  5 x sit to stand 14.8 sec or less without balance loss REVISED GOAL: < 13s Baseline: 25 sec,with UE support B  required mod assist on final rep to avoid fall  Goal status: progressing 10/26/23  progressing 11/05/23  11/18/23 multi tries and posterior lean,progressing  11/30/23 LOB progressing  12/10/23 20 sec 12/30/23 progressing   01/18/24 18.5 sec with 1 x bracing knees 02/01/24 progressing  uses knees to brace with 02/10/24 16 sec progressing  03/10/24 15 sec with no LOB or hesitation 03/16/24 14.4 sec MET    4. Will increase BERG score to 45 to demonstrate improved balance   Baseline: 36 REVISED GOAL: 48  Goal status: progressing 02/17/24  and 02/29/24 03/16/24 42/56 progressing  04/04/24  and 04/11/24    PLAN:  PT FREQUENCY: 2x/week  PT DURATION: 12 weeks  PLANNED INTERVENTIONS: 97110-Therapeutic exercises, 97530- Therapeutic activity, 97112- Neuromuscular re-education, 97535- Self Care, [id number]- Manual therapy, [id number]- Electrical stimulation (unattended), and 97033- Ionotophoresis 4mg /ml Dexamethasone   PLAN FOR NEXT SESSION:  progress balance and gait Markesha Hannig,ANGIE, PTA,  04/11/2024, 2:47 PM [facility] [place], [place], [address] Phone: [phone]   Fax:  [phone]

## 2024-04-12 ENCOUNTER — Ambulatory Visit: Admitting: Family Medicine

## 2024-04-12 DIAGNOSIS — E785 Hyperlipidemia, unspecified: Secondary | ICD-10-CM

## 2024-04-12 DIAGNOSIS — I10 Essential (primary) hypertension: Secondary | ICD-10-CM

## 2024-04-13 ENCOUNTER — Ambulatory Visit: Admitting: Physical Therapy

## 2024-04-13 DIAGNOSIS — M5442 Lumbago with sciatica, left side: Secondary | ICD-10-CM | POA: Diagnosis not present

## 2024-04-13 DIAGNOSIS — R262 Difficulty in walking, not elsewhere classified: Secondary | ICD-10-CM

## 2024-04-13 DIAGNOSIS — R293 Abnormal posture: Secondary | ICD-10-CM | POA: Diagnosis not present

## 2024-04-13 DIAGNOSIS — M5441 Lumbago with sciatica, right side: Secondary | ICD-10-CM | POA: Diagnosis not present

## 2024-04-13 DIAGNOSIS — G8929 Other chronic pain: Secondary | ICD-10-CM | POA: Diagnosis not present

## 2024-04-13 NOTE — Therapy (Signed)
OUTPATIENT PHYSICAL THERAPY LOWER EXTREMITY .         Patient Name: JOEANNE ROBICHEAUX MRN: [id number] DOB:Nov 23, 1940, 83 y.o., female Today's Date: 04/13/2024  END OF SESSION:  PT End of Session - 04/13/24 1450     Visit Number 36    Date for Recertification  05/05/24    PT Start Time 1445    PT Stop Time 1530    PT Time Calculation (min) 45 min            Past Medical History:  Diagnosis Date   Anxiety    Arthritis    Cancer (HCC)    lt. foot melanoma   Chronic kidney disease    lt. kidney cyst   Coronary artery disease    GERD (gastroesophageal reflux disease)    History of healed stress fracture 2013   bilateral feet   Hx of phlebitis    reports remote hx of superfical blood clots never anticoagulated   Hypertension    Osteopenia    Pneumonia    Past Surgical History:  Procedure Laterality Date   CATARACT EXTRACTION W/ INTRAOCULAR LENS IMPLANT Bilateral    CHOLECYSTECTOMY N/A 09/19/2016   Procedure: LAPAROSCOPIC CHOLECYSTECTOMY POSSIBLE INTRAOPERATIVE CHOLANGIOGRAM;  Surgeon: Donnice Bury, MD;  Location: [facility] OR;  Service: General;  Laterality: N/A;   ERCP N/A 09/18/2016   Procedure: ENDOSCOPIC RETROGRADE CHOLANGIOPANCREATOGRAPHY (ERCP);  Surgeon: Victory LITTIE Legrand DOUGLAS, MD;  Location: [facility] ENDOSCOPY;  Service: Endoscopy;  Laterality: N/A;   FOOT SURGERY     patient reports four foot surgeries   FOOT SURGERY Left    bunion, hammer toe surgery   FOOT SURGERY Right    shaved bunion, hammer toe correction   HERNIA REPAIR     KNEE ARTHROSCOPY Right 06/02/1995   REPLACEMENT TOTAL KNEE Left    REPLACEMENT TOTAL KNEE Left 10/30/2012   RETINAL DETACHMENT SURGERY     RETINAL DETACHMENT SURGERY Left 06/01/2006   ROBOTIC ASSITED PARTIAL NEPHRECTOMY Left 06/03/2022   Procedure: XI ROBOTIC ASSITED PARTIAL NEPHRECTOMY WITH ULTRASOUND AND VENTRAL HERNIA REPAIR;  Surgeon: Alvaro Hummer, MD;  Location: [facility] ORS;  Service: Urology;  Laterality: Left;  3 HRS    THYROIDECTOMY, PARTIAL Right    THYROIDECTOMY, PARTIAL  06/02/2003   TOTAL KNEE ARTHROPLASTY Right 11/13/2019   Procedure: TOTAL KNEE ARTHROPLASTY SDDC;  Surgeon: Melodi Lerner, MD;  Location: [facility] ORS;  Service: Orthopedics;  Laterality: Right;    Patient Active Problem List   Diagnosis Date Noted   Renal mass 06/03/2022   Open wound of left side of back 01/20/2021   Lipoma of back 11/18/2020   Lymphedema 11/18/2020   Melanocytic nevi of right upper limb, including shoulder 11/18/2020   Personal history of malignant melanoma of skin 11/18/2020   Sebaceous cyst 11/18/2020   Seborrheic dermatitis 11/18/2020   Pre-diabetes 04/03/2020   OA (osteoarthritis) of knee 11/13/2019   Total knee replacement status, 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   Pain in right knee 08/03/2019   Fluid collection at surgical site 10/20/2018   Encounter for postoperative examination after surgery for malignant neoplasm 09/30/2018   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/2018   Plantar fasciitis of right foot 02/03/2017   Porokeratosis 02/03/2017   Acute gallstone pancreatitis    Calculus of gallbladder with acute cholecystitis and obstruction    LFT elevation    Stress fracture 01/01/2016   Insomnia 12/16/2015   Peripheral neuropathy 10/18/2015   Renal insufficiency 03/06/2015   HTN (hypertension) 03/06/2015   Osteopenia 03/06/2015  GERD (gastroesophageal reflux disease) 03/06/2015   Vaginal pessary present 03/06/2015   Metatarsalgia 07/13/2012   Increased frequency of urination 04/13/2012   DDD (degenerative disc disease), cervical 01/19/2012   Knee stiffness 01/19/2012   Low back pain 01/19/2012   Neck pain 01/19/2012   Pain of hand 01/19/2012   Hallux valgus 09/20/2011   Hyperkeratosis 08/13/2011   Anxiety disorder 07/06/2011   Depression 07/06/2011   Diverticulosis of large intestine 07/06/2011   Non-toxic multinodular goiter 07/06/2011   Atrophic vaginitis 10/24/2010   Dermatographic urticaria  09/17/2010   Inflamed seborrheic keratosis 09/17/2010   Closed fracture of fifth metatarsal bone 07/10/2010   Diagnosis unknown 02/27/2010   Hip pain 06/08/2008   Pulmonary nodule seen on imaging study 06/08/2008    PCP: Watt Raisin, MD  REFERRING PROVIDER: Charlie Dolores, MD, [facility]  REFERRING DIAG: degeneration of discs of lumbar spine  THERAPY DIAG:  No diagnosis found.  Rationale for Evaluation and Treatment: Rehabilitation  ONSET DATE: chronic  SUBJECTIVE:   SUBJECTIVE STATEMENT   bunions are bothering me  PERTINENT HISTORY: Saw orthopedist, took prednisone, which didn't help, orthopedist may want to do MRI, referred to PT by both PCP and orthopedist, also B degenerative arthritis hips   PAIN:  Are you having pain? 7/10 back  PRECAUTIONS: Fall  RED FLAGS: None   WEIGHT BEARING RESTRICTIONS: No  FALLS:  Has patient fallen in last 6 months? Yes. Number of falls several  LIVING ENVIRONMENT: Lives with: lives with their family and lives with their daughter Lives in: House/apartment Stairs: Yes: Internal: one flight steps; on left going up Has following equipment at home: Environmental Consultant - 4 wheeled  OCCUPATION: retired  PLOF: Independent and Independent with basic ADLs  PATIENT GOALS: reduce pain hips and lower back so I can tolerate more activity  NEXT MD VISIT: not scheduled yet  OBJECTIVE:  Note: Objective measures were completed at Evaluation unless otherwise noted.  DIAGNOSTIC FINDINGS: X-ray done 11/23; IMPRESSION: 1. 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 2. Lower lumbar spondylosis.  PATIENT SURVEYS:  Modified Oswestry Modified Oswestry Low Back Pain Disability Questionnaire: 29 / 50 = 58.0 %   COGNITION: Overall cognitive status: Within functional limits for tasks assessed     SENSATION: Light touch: WFL  EDEMA:  None noted   MUSCLE LENGTH: Hamstrings: Right wfl deg; Left wfl deg Debby test:  Right wfl deg; Left wfl deg  POSTURE: scoliotic posture noted concavity L lumbar, pronounced thoracic kyphosis  PALPATION: Not tender with light palpation B gr trochanters hips  LOWER EXTREMITY ROM:B hip ROM wnl, able to achieve tailor position each leg in sitting  LUMBAR ROM: FB limited 15%, also risk of falling pt needed UE support Lumbar ext, 25% with marked side bending to L with this motion SB B 35% B with central lower back pain LOWER EXTREMITY MMT:  MMT Right eval Left eval  Hip flexion    Hip extension    Hip abduction    Hip adduction    Hip internal rotation    Hip external rotation    Knee flexion    Knee extension    Ankle dorsiflexion    Ankle plantarflexion    Ankle inversion    Ankle eversion     (Blank rows = not tested)   FUNCTIONAL TESTS:  5 times sit to stand: 25 sec, required mod assist of PT to avoid falling backwards into chair with final attempt  GAIT: Distance walked:  in clinic up to 60  Assistive device utilized: Single point cane, Walker - 2 wheeled, and none Level of assistance: Min A without device and with cane Comments: very unstable without device                                                                                                                                 TREATMENT DATE:  04/13/24 Amb 450 feet ( 4 1/2 laps inside) 2 laps holding 6# hand wt for strength and balance Black tband trunk flexion and ext 2 sets 10 Seated row 20# 2 sets 10- for posture and back strength Ball kicking and toss 25x CGA , LOB but inly 1 x needed help to regain HS curl 2 sets 10 20# Knee ext 5 # 2 sets 10 ( 10 # too heavy) Nustep L 6 10 min Resisted gait fwd and backward 20# 5 x each with no LOB   04/11/24 Nustep L 5 10 min Black tband trunk flexion and ext 20 x Seated row 20# 2 sets 10 STS on airex 10 x- CGA Walking ball toss fwd and laterally 6 inch alt step taps 20 x 2 sets 6 inch step ups 10 x BIL with UE support Resisted  gait fwd and back 5 x Resisted gait with side stepping over 1 inch stick  04/06/24 Resisted gait 30 # 5 x fwd and back CGA, laterally min a with decreased eccentric control 3 x each Nustep L 5 10 min 6 inch alt step taps 20 x 2 sets 6 inch step ups 10 x BIL with UE support Kicking ball min A Ball toss on airex- CGA 1 LOB sitting back on mat Min a vector taps HS curl 20# 2 sets 10 Knee ext 10# 2 sets 10 STS wt ball 10 x  04/04/24 BERG 45/56 administered and assessed other goals Nustep L 5 10 min 3# single HHS 6 inch step tap fwd and laterally BIL 3# HHA marching fwd and back 15 feet 3 x then laterally 3# stepping fwd  over foam beams with min A 3# laterally stepping over foam beam min A  03/23/24 Nustep L 5 10 min STS with wt ball 10 x chest press HS curl 25# 2 sets 10 Knee ext 10# 2 sets 10 Black tband 2 sets 10 flex and ext Seated row 20# 2 sets 10 Resisted gait 4 way 5 x each 20#   03/21/24 Walk outside 4 min working on cadence and stride. 1 standing rest break Resisted gait 4 way 5 x each 20# and did without LOB at lower resistance More square stepping with HHA.more fear of stepping and falling Nustep L 5 10 min STS with wt ball chest press Standing on airex ball toss CGA  03/16/24 Nustep L 5 10 min BERG 42/56 STS 14.4 sec 5 x Foam beam in // bars 5 x tandem fwd and back,laterally Resisted gait 30# 5 x fwd  and 5 x backward, 3 x each side- struggled with lateral mvmt  03/14/24 Nustep L 5 10 min Amb 6 min outside CGA no AD working on cadence and confidence. HHS with step ups on curbs and CGA with step down with encouragement, very fearful. Decreased balance with decline and struggled with incline and increased LBP d/t fwd flex, last 45 sec needed HHA d/t fatigue Cable pulleys 10# standing row 10x then shld ext 10x Seated row 2 sets 10 Lat pull down 2 sets 10 STS with 4# chest press 10 x 4# standing chest press and OH press 10 x   03/10/24 Nustep L 5 10  min STS wt ball chest press 10 x 7# farmers carry 1 lap each hand Step up with opp leg ext 10 x each with UE Step up laterally 10 x each with UE 30# 3 x fwd and backward Standing tband shld ext and row 2 sets 10 Standing tband hip ext and abd 2 sets 10  03/07/24 Nustep L 5 10 min Seated roling ball out for back stretch Seated resisted trunk ext Standing tband shld ext and row 2 sets 10 Standing tband hip ext and abd 2 sets 10  02/29/24 Nustep L 6 10 min Stepping over various heights and directions- HHA with LOB STS with wt ball chest press 10x Farmers carry 7# 1 lap each hand Resisted trunk ext 20 x HHA red tband SL hip flex,ext and abd 10 x Green tband shld ext and row 2 sets 10     02/22/24 Nustep L 6 10 min Resisted trunk ext 20 x Feet on ball bride, KTC, obl 10 x Iso abdominals 10 x hold 3 sec Green tband clams and hip flex 2 sets 10 Gentle PROM LE and trunk Red tband standing shld ext and row 2 sets 10  02/17/24 Nustep L 6 10 min In // bars foam beam fwd and back tandem 3 x then side stepping 3 x , light HHA STS from standard chair 2 sets 5 cg-min A 6 inch alt step tap 2 sets 10- LOB 2 not clearing foot  20# resisted gait fwd and back 5 x , then 3 x each side CG-min A with 2 LOB Aura carry 6#  02/15/24 Nustep L 6 10 min STS with wt ball chest press 10 x Resisted trunk flex and ext 20 x Seated row 20# 2 sets 10 Knee ext 5# 2 sets 10 HS curl 20# 2 sets 10 Leg press 20# 3 sets 10   02/10/24 Nustep L 6 10 min STS wt ball chest press 10 x Knee ext 5# 2 sets 10 HS curl 20# 2 sets 10 Leg press 20# 3 sets 10 Tband standing shld ext and row 2 sets 10 Tband seated trunk flex and ext  02/08/24 Amb outside 300 feet with curb sup-CGA without AD with cuing to increase stride as she was taking short shuffle steps esp as she fatigued Nustep L 6 10 min STS wt ball CGA 10 x Light HHA vectro taps Light HHA stepping fwd and back over 2 in pole 3# HHA marching fwd and  back 20 feet 2 x 3# HHA side stepping 20 feet 2 x  02/03/24 Nustep L 5 10 min BERG   36/56 Ball toss on airex CGA with 2 LOB STS from elevated mat 2 sets 5 on airex min A Obstacle course stepping on over and around various objects 6 in step taps 10 x each leg then alt CG-min  A      02/01/24 Nustep L 5 10 min Amb HHA 5 min outside working on upright posture and cadence Trunk flex and ext 20 x blue tband STS wt ball 10x  HHA on airex stepping on and off fwd,SW and back 10 x each HS curl 25# 2 sets 10 Knee ext 5# 2 sets 10     01/25/24 Nustep L 5 STS wt ball CGA 10 x, cued to not use legs to brace on mat Standing on airex CGA shld ext and row 10 x Standing on airex HHA alt LE 20 x marching,SL hip flex,ext and abd Standing HHA marching fwd and back 20 feet 2 x each and side stepping 20 feet 2 x each Seated resisted trunk flex and ext 20 x Amb 500 feet HHA working on upright trunk, and cadence and she did very well- 2 brief standing rest breaks   01/18/24 Nustep L 5 Goals assessed and documented Knee ext 5# 2 set s10 HS curl 15# 2 sets 10 Resisted seated trunk ext 2 sets 10 Seated row 20# 10 x  15# 10 x 3# ankle wts HHA marcing fwd and back ward 15 feet 3 x each, side stepping 15 feet 3 x each 3# ankle wt HHA 10 x each SL hip flex,ext and abd  12/30/23 Nustep L 5 Goals assessed and documented STS 5x 16.54 sec Foam beam in // bars 3x side stepping and then tandem fwd and back CGA with light HHA STS with ball toss 10 x Ball toss on airex, only 1 LOB 2 sets 10 Knee ext 5# 2 set s10 HS curl 15# 2 sets 10 [employer] 5# 1 lap each hand Resisted seated trunk ext 2 sets 10 Seated row 15# 2 sets 10     12/13/23 Nustep L 5 8 min  Knee ext 5# 2 set s10 HS curl 15# 2 sets 10 STS with 4# chest press 10x ADD ball squeeze 2x15 Hip abd w/annual resistance 2x15 Alt 4in box taps CGA 2x10  12/10/23 STS with 4# chest press 10x Assessed and documented  goals Nustep L 5 Resisted gait 20# 3 x 4 ways- min A Knee ext 5# 2 set s10 HS curl 15# 2 sets 10  12/07/23 Resisted trunk flex and ext 20 x Seated resisted row and shld ext 2 sets 10 Nustep L 5 8 min Green tband HS curl 2 sets 10 3# LAQ 2 sets 10 3# HHA SL hip flex,ext and abd 10 x each- decreased RT LE mvmt ADD ball squeeze 15 x    12/02/23 Nustep L 5 BM for vacuuming to decrease strain on back Green tband HS curl,hip flex and clams seated 2 sets 10 ADD ball squeeze 2 sets 10 3# LAQ 2 sets 10 Red tband seated row and shld ext 2 sets 10 Tband trunk ext 2 sets 10 Standing with RW 3# hip ext and 10 x each      09/20/23:   Evaluation, explained to pt that long term management of arthritic changes in hips in spine, should respond well to moderate exercise. Practiced gait with st cane, pt without any marked improvement in her stability with the use of the cane.  Therefore practiced with front wheeled walker, marked improvement in gait pattern . Recommended that she use a front wheeled walker which is more light weight and easier to maneuver in and out of car for outings, she is a high fall risk.  PATIENT  EDUCATION:  Education details: POC, goals Person educated: Patient Education method: Explanation, Demonstration, Tactile cues, and Verbal cues Education comprehension: verbalized understanding and returned demonstration  HOME EXERCISE PROGRAM: Green t band hip abd/ER in sitting and green t band ankle pumps L .   ASSESSMENT: Amb 450 feet ( 4 1/2 laps inside) 2 laps holding 6# hand wt for strength and balance progressing towards goals. Balance better as noted by less assistance needed with balance interventions. Able to maintain better and more consistence upright posture with walking and activities with less cuing. CLINICAL IMPRESSION:    OBJECTIVE IMPAIRMENTS: decreased activity tolerance, decreased balance, decreased knowledge of condition, decreased knowledge of  use of DME, decreased mobility, difficulty walking, decreased ROM, decreased strength, decreased safety awareness, impaired perceived functional ability, postural dysfunction, and pain.   ACTIVITY LIMITATIONS: carrying, lifting, bending, standing, squatting, transfers, bed mobility, and locomotion level  PARTICIPATION LIMITATIONS: meal prep, cleaning, laundry, shopping, community activity, and yard work  PERSONAL FACTORS: Age, Behavior pattern, Fitness, Past/current experiences, Time since onset of injury/illness/exacerbation, and 1-2 comorbidities: severe DJD B hips, lumbar region, neuropathy are also affecting patient's functional outcome.   REHAB POTENTIAL: Good  CLINICAL DECISION MAKING: Evolving/moderate complexity  EVALUATION COMPLEXITY: Moderate   GOALS: Goals reviewed with patient? Yes  SHORT TERM GOALS: Target date: 2 weeks 5 5/25 I HEP Baseline: Goal status: MET 10/26/23   LONG TERM GOALS: Target date:   Modified Oswestry Low Back Pain Disability Questionnaire: 29 / 50 = 58.0 %  Baseline:  Goal status: 11/11/23 progressing  and 11/18/23  11/30/23 progressing 12/10/23 progressing   12/30/23 progressing   01/18/24 progressing 02/10/24 MET  2.  Safe gait with appropriate device , over 350' for safe household distances, without balance loss REVISED GOAL: 500+ ft  Baseline:  Goal status: on going 10/26/23  progressing 11/04/23  and 11/11/23  Progressing 11/18/23  progressing 11/30/23 distance met but unsteady  12/10/23  amb without AD 250 feet, no LOB  but instability with lateral sway 12/30/23 progressing 250 feet then slows down and balance decreases 01/18/24 progressing  Progressing and showing good improvements 02/01/24 02/10/24 progressing and 02/17/24. Progressing 03/10/24  03/16/24 met for distance but still moments of unsteadiness  03/23/24 progressing 04/04/24 MET   04/13/24 progressing   3.  5 x sit to stand 14.8 sec or less without balance loss REVISED GOAL: < 13s Baseline: 25  sec,with UE support B  required mod assist on final rep to avoid fall  Goal status: progressing 10/26/23  progressing 11/05/23  11/18/23 multi tries and posterior lean,progressing  11/30/23 LOB progressing  12/10/23 20 sec 12/30/23 progressing   01/18/24 18.5 sec with 1 x bracing knees 02/01/24 progressing uses knees to brace with 02/10/24 16 sec progressing  03/10/24 15 sec with no LOB or hesitation 03/16/24 14.4 sec MET    4. Will increase BERG score to 45 to demonstrate improved balance   Baseline: 36 REVISED GOAL: 48  Goal status: progressing 02/17/24  and 02/29/24 03/16/24 42/56 progressing  04/04/24  and 04/11/24    PLAN:  PT FREQUENCY: 2x/week  PT DURATION: 12 weeks  PLANNED INTERVENTIONS: 97110-Therapeutic exercises, 97530- Therapeutic activity, 97112- Neuromuscular re-education, 97535- Self Care, 02859- Manual therapy, G0283- Electrical stimulation (unattended), and 97033- Ionotophoresis 4mg /ml Dexamethasone   PLAN FOR NEXT SESSION:  progress balance and gait David Rodriquez,ANGIE, PTA,  04/13/2024, 2:50 PM [facility] [address] [facility] [place], [place], [address] Phone: [phone]   Fax:  [phone]    

## 2024-04-14 ENCOUNTER — Ambulatory Visit: Admitting: Podiatry

## 2024-04-14 ENCOUNTER — Other Ambulatory Visit: Payer: Self-pay | Admitting: Family Medicine

## 2024-04-15 ENCOUNTER — Encounter: Payer: Self-pay | Admitting: Podiatry

## 2024-04-17 ENCOUNTER — Other Ambulatory Visit: Payer: Self-pay | Admitting: Family Medicine

## 2024-04-17 ENCOUNTER — Other Ambulatory Visit: Payer: Self-pay | Admitting: Podiatry

## 2024-04-17 DIAGNOSIS — G8929 Other chronic pain: Secondary | ICD-10-CM

## 2024-04-17 DIAGNOSIS — M5440 Lumbago with sciatica, unspecified side: Secondary | ICD-10-CM

## 2024-04-17 DIAGNOSIS — M25551 Pain in right hip: Secondary | ICD-10-CM

## 2024-04-17 DIAGNOSIS — M159 Polyosteoarthritis, unspecified: Secondary | ICD-10-CM

## 2024-04-17 MED ORDER — HYDROCODONE-ACETAMINOPHEN 7.5-325 MG PO TABS: 1.0000 | ORAL_TABLET | Freq: Three times a day (TID) | ORAL | 0 refills | Status: DC | PRN

## 2024-04-17 MED ORDER — GABAPENTIN 300 MG PO CAPS: 300.0000 mg | ORAL_CAPSULE | Freq: Four times a day (QID) | ORAL | 0 refills | Status: DC

## 2024-04-17 NOTE — Telephone Encounter (Signed)
 Copied from CRM #[id number]. Topic: Clinical - Medication Refill >> Apr 17, 2024 11:09 AM Macario HERO wrote: Medication: HYDROcodone -acetaminophen  (NORCO) 7.5-325 MG tablet [[id number]]  Has the patient contacted their pharmacy? No (Agent: If no, request that the patient contact the pharmacy for the refill. If patient does not wish to contact the pharmacy document the reason why and proceed with request.) (Agent: If yes, when and what did the pharmacy advise?)  This is the patient's preferred pharmacy:  [facility] [id number] [place], [place] - [address] [place] [address] Phone: [phone] Fax: [phone]   Is this the correct pharmacy for this prescription? Yes If no, delete pharmacy and type the correct one.   Has the prescription been filled recently? Yes  Is the patient out of the medication? Yes  Has the patient been seen for an appointment in the last year OR does the patient have an upcoming appointment? Yes  Can we respond through MyChart? Yes  Agent: Please be advised that Rx refills may take up to 3 business days. We ask that you follow-up with your pharmacy.

## 2024-04-18 ENCOUNTER — Ambulatory Visit: Admitting: Physical Therapy

## 2024-04-18 DIAGNOSIS — G8929 Other chronic pain: Secondary | ICD-10-CM | POA: Diagnosis not present

## 2024-04-18 DIAGNOSIS — M5441 Lumbago with sciatica, right side: Secondary | ICD-10-CM | POA: Diagnosis not present

## 2024-04-18 DIAGNOSIS — R293 Abnormal posture: Secondary | ICD-10-CM

## 2024-04-18 DIAGNOSIS — M5442 Lumbago with sciatica, left side: Secondary | ICD-10-CM | POA: Diagnosis not present

## 2024-04-18 DIAGNOSIS — R262 Difficulty in walking, not elsewhere classified: Secondary | ICD-10-CM

## 2024-04-18 NOTE — Therapy (Signed)
 OUTPATIENT PHYSICAL THERAPY LOWER EXTREMITY .         Patient Name: [name] SEVERS MRN: [id number] DOB:06-07-1940, 83 y.o., female Today's Date: 04/18/2024  END OF SESSION:  PT End of Session - 04/18/24 1355     Visit Number 37    Date for Recertification  05/05/24    PT Start Time 1357    PT Stop Time 1442    PT Time Calculation (min) 45 min            Past Medical History:  Diagnosis Date   Anxiety    Arthritis    Cancer (HCC)    lt. foot melanoma   Chronic kidney disease    lt. kidney cyst   Coronary artery disease    GERD (gastroesophageal reflux disease)    History of healed stress fracture 2013   bilateral feet   Hx of phlebitis    reports remote hx of superfical blood clots never anticoagulated   Hypertension    Osteopenia    Pneumonia    Past Surgical History:  Procedure Laterality Date   CATARACT EXTRACTION W/ INTRAOCULAR LENS IMPLANT Bilateral    CHOLECYSTECTOMY N/A 09/19/2016   Procedure: LAPAROSCOPIC CHOLECYSTECTOMY POSSIBLE INTRAOPERATIVE CHOLANGIOGRAM;  Surgeon: Donnice Bury, MD;  Location: [facility] OR;  Service: General;  Laterality: N/A;   ERCP N/A 09/18/2016   Procedure: ENDOSCOPIC RETROGRADE CHOLANGIOPANCREATOGRAPHY (ERCP);  Surgeon: Victory LITTIE Legrand DOUGLAS, MD;  Location: [facility] ENDOSCOPY;  Service: Endoscopy;  Laterality: N/A;   FOOT SURGERY     patient reports four foot surgeries   FOOT SURGERY Left    bunion, hammer toe surgery   FOOT SURGERY Right    shaved bunion, hammer toe correction   HERNIA REPAIR     KNEE ARTHROSCOPY Right 06/02/1995   REPLACEMENT TOTAL KNEE Left    REPLACEMENT TOTAL KNEE Left 10/30/2012   RETINAL DETACHMENT SURGERY     RETINAL DETACHMENT SURGERY Left 06/01/2006   ROBOTIC ASSITED PARTIAL NEPHRECTOMY Left 06/03/2022   Procedure: XI ROBOTIC ASSITED PARTIAL NEPHRECTOMY WITH ULTRASOUND AND VENTRAL HERNIA REPAIR;  Surgeon: Alvaro Hummer, MD;  Location: [facility] ORS;  Service: Urology;  Laterality: Left;  3 HRS    THYROIDECTOMY, PARTIAL Right    THYROIDECTOMY, PARTIAL  06/02/2003   TOTAL KNEE ARTHROPLASTY Right 11/13/2019   Procedure: TOTAL KNEE ARTHROPLASTY SDDC;  Surgeon: Melodi Lerner, MD;  Location: [facility] ORS;  Service: Orthopedics;  Laterality: Right;    Patient Active Problem List   Diagnosis Date Noted   Renal mass 06/03/2022   Open wound of left side of back 01/20/2021   Lipoma of back 11/18/2020   Lymphedema 11/18/2020   Melanocytic nevi of right upper limb, including shoulder 11/18/2020   Personal history of malignant melanoma of skin 11/18/2020   Sebaceous cyst 11/18/2020   Seborrheic dermatitis 11/18/2020   Pre-diabetes 04/03/2020   OA (osteoarthritis) of knee 11/13/2019   Total knee replacement status, 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   Pain in right knee 08/03/2019   Fluid collection at surgical site 10/20/2018   Encounter for postoperative examination after surgery for malignant neoplasm 09/30/2018   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/2018   Plantar fasciitis of right foot 02/03/2017   Porokeratosis 02/03/2017   Acute gallstone pancreatitis    Calculus of gallbladder with acute cholecystitis and obstruction    LFT elevation    Stress fracture 01/01/2016   Insomnia 12/16/2015   Peripheral neuropathy 10/18/2015   Renal insufficiency 03/06/2015   HTN (hypertension) 03/06/2015   Osteopenia 03/06/2015  GERD (gastroesophageal reflux disease) 03/06/2015   Vaginal pessary present 03/06/2015   Metatarsalgia 07/13/2012   Increased frequency of urination 04/13/2012   DDD (degenerative disc disease), cervical 01/19/2012   Knee stiffness 01/19/2012   Low back pain 01/19/2012   Neck pain 01/19/2012   Pain of hand 01/19/2012   Hallux valgus 09/20/2011   Hyperkeratosis 08/13/2011   Anxiety disorder 07/06/2011   Depression 07/06/2011   Diverticulosis of large intestine 07/06/2011   Non-toxic multinodular goiter 07/06/2011   Atrophic vaginitis 10/24/2010   Dermatographic urticaria  09/17/2010   Inflamed seborrheic keratosis 09/17/2010   Closed fracture of fifth metatarsal bone 07/10/2010   Diagnosis unknown 02/27/2010   Hip pain 06/08/2008   Pulmonary nodule seen on imaging study 06/08/2008    PCP: Watt Raisin, MD  REFERRING PROVIDER: Charlie Dolores, MD, [facility]  REFERRING DIAG: degeneration of discs of lumbar spine  THERAPY DIAG:  Chronic bilateral low back pain with right-sided sciatica  Difficulty in walking, not elsewhere classified  Posture imbalance  Chronic bilateral low back pain with left-sided sciatica  Rationale for Evaluation and Treatment: Rehabilitation  ONSET DATE: chronic  SUBJECTIVE:   SUBJECTIVE STATEMENT   Back inj 12/2 sure hope it helps  PERTINENT HISTORY: Saw orthopedist, took prednisone, which didn't help, orthopedist may want to do MRI, referred to PT by both PCP and orthopedist, also B degenerative arthritis hips   PAIN:  Are you having pain? 4/10 back FEET are bad  PRECAUTIONS: Fall  RED FLAGS: None   WEIGHT BEARING RESTRICTIONS: No  FALLS:  Has patient fallen in last 6 months? Yes. Number of falls several  LIVING ENVIRONMENT: Lives with: lives with their family and lives with their daughter Lives in: House/apartment Stairs: Yes: Internal: one flight steps; on left going up Has following equipment at home: Environmental Consultant - 4 wheeled  OCCUPATION: retired  PLOF: Independent and Independent with basic ADLs  PATIENT GOALS: reduce pain hips and lower back so I can tolerate more activity  NEXT MD VISIT: not scheduled yet  OBJECTIVE:  Note: Objective measures were completed at Evaluation unless otherwise noted.  DIAGNOSTIC FINDINGS: X-ray done 11/23; IMPRESSION: 1. 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 2. Lower lumbar spondylosis.  PATIENT SURVEYS:  Modified Oswestry Modified Oswestry Low Back Pain Disability Questionnaire: 29 / 50 = 58.0 %   COGNITION: Overall  cognitive status: Within functional limits for tasks assessed     SENSATION: Light touch: WFL  EDEMA:  None noted   MUSCLE LENGTH: Hamstrings: Right wfl deg; Left wfl deg Debby test: Right wfl deg; Left wfl deg  POSTURE: scoliotic posture noted concavity L lumbar, pronounced thoracic kyphosis  PALPATION: Not tender with light palpation B gr trochanters hips  LOWER EXTREMITY ROM:B hip ROM wnl, able to achieve tailor position each leg in sitting  LUMBAR ROM: FB limited 15%, also risk of falling pt needed UE support Lumbar ext, 25% with marked side bending to L with this motion SB B 35% B with central lower back pain LOWER EXTREMITY MMT:  MMT Right eval Left eval  Hip flexion    Hip extension    Hip abduction    Hip adduction    Hip internal rotation    Hip external rotation    Knee flexion    Knee extension    Ankle dorsiflexion    Ankle plantarflexion    Ankle inversion    Ankle eversion     (Blank rows = not tested)  FUNCTIONAL TESTS:  5 times sit to stand: 25 sec, required mod assist of PT to avoid falling backwards into chair with final attempt  GAIT: Distance walked: in clinic up to 60  Assistive device utilized: Single point cane, Walker - 2 wheeled, and none Level of assistance: Min A without device and with cane Comments: very unstable without device                                                                                                                                 TREATMENT DATE:   04/18/24 TM 1 mph CGA with UE needed 3 min UBE standing 1 min each way for back strength Black tband trunk flex and ext 2 sets 10 Seated row 20# 2 sets 10 Lat pull down 20# 2 sets 10 HS curl 2 sets 10 20# Knee ext 5 # 2 sets 10  Nustep L 5 10 min STS with wt ball   04/13/24 Amb 450 feet ( 4 1/2 laps inside) 2 laps holding 6# hand wt for strength and balance Black tband trunk flexion and ext 2 sets 10 Seated row 20# 2 sets 10- for posture and  back strength Ball kicking and toss 25x CGA , LOB but inly 1 x needed help to regain Nustep L 6 10 min Resisted gait fwd and backward 20# 5 x each with no LOB   04/11/24 Nustep L 5 10 min Black tband trunk flexion and ext 20 x Seated row 20# 2 sets 10 STS on airex 10 x- CGA Walking ball toss fwd and laterally 6 inch alt step taps 20 x 2 sets 6 inch step ups 10 x BIL with UE support Resisted gait fwd and back 5 x Resisted gait with side stepping over 1 inch stick  04/06/24 Resisted gait 30 # 5 x fwd and back CGA, laterally min a with decreased eccentric control 3 x each Nustep L 5 10 min 6 inch alt step taps 20 x 2 sets 6 inch step ups 10 x BIL with UE support Kicking ball min A Ball toss on airex- CGA 1 LOB sitting back on mat Min a vector taps HS curl 20# 2 sets 10 Knee ext 10# 2 sets 10 STS wt ball 10 x  04/04/24 BERG 45/56 administered and assessed other goals Nustep L 5 10 min 3# single HHS 6 inch step tap fwd and laterally BIL 3# HHA marching fwd and back 15 feet 3 x then laterally 3# stepping fwd  over foam beams with min A 3# laterally stepping over foam beam min A  03/23/24 Nustep L 5 10 min STS with wt ball 10 x chest press HS curl 25# 2 sets 10 Knee ext 10# 2 sets 10 Black tband 2 sets 10 flex and ext Seated row 20# 2 sets 10 Resisted gait 4 way 5 x each 20#   03/21/24 Walk outside 4 min working on cadence and stride.  1 standing rest break Resisted gait 4 way 5 x each 20# and did without LOB at lower resistance More square stepping with HHA.more fear of stepping and falling Nustep L 5 10 min STS with wt ball chest press Standing on airex ball toss CGA  03/16/24 Nustep L 5 10 min BERG 42/56 STS 14.4 sec 5 x Foam beam in // bars 5 x tandem fwd and back,laterally Resisted gait 30# 5 x fwd and 5 x backward, 3 x each side- struggled with lateral mvmt  03/14/24 Nustep L 5 10 min Amb 6 min outside CGA no AD working on cadence and confidence. HHS with  step ups on curbs and CGA with step down with encouragement, very fearful. Decreased balance with decline and struggled with incline and increased LBP d/t fwd flex, last 45 sec needed HHA d/t fatigue Cable pulleys 10# standing row 10x then shld ext 10x Seated row 2 sets 10 Lat pull down 2 sets 10 STS with 4# chest press 10 x 4# standing chest press and OH press 10 x   03/10/24 Nustep L 5 10 min STS wt ball chest press 10 x 7# farmers carry 1 lap each hand Step up with opp leg ext 10 x each with UE Step up laterally 10 x each with UE 30# 3 x fwd and backward Standing tband shld ext and row 2 sets 10 Standing tband hip ext and abd 2 sets 10  03/07/24 Nustep L 5 10 min Seated roling ball out for back stretch Seated resisted trunk ext Standing tband shld ext and row 2 sets 10 Standing tband hip ext and abd 2 sets 10  02/29/24 Nustep L 6 10 min Stepping over various heights and directions- HHA with LOB STS with wt ball chest press 10x Farmers carry 7# 1 lap each hand Resisted trunk ext 20 x HHA red tband SL hip flex,ext and abd 10 x Green tband shld ext and row 2 sets 10     02/22/24 Nustep L 6 10 min Resisted trunk ext 20 x Feet on ball bride, KTC, obl 10 x Iso abdominals 10 x hold 3 sec Green tband clams and hip flex 2 sets 10 Gentle PROM LE and trunk Red tband standing shld ext and row 2 sets 10  02/17/24 Nustep L 6 10 min In // bars foam beam fwd and back tandem 3 x then side stepping 3 x , light HHA STS from standard chair 2 sets 5 cg-min A 6 inch alt step tap 2 sets 10- LOB 2 not clearing foot  20# resisted gait fwd and back 5 x , then 3 x each side CG-min A with 2 LOB Aura carry 6#  02/15/24 Nustep L 6 10 min STS with wt ball chest press 10 x Resisted trunk flex and ext 20 x Seated row 20# 2 sets 10 Knee ext 5# 2 sets 10 HS curl 20# 2 sets 10 Leg press 20# 3 sets 10   02/10/24 Nustep L 6 10 min STS wt ball chest press 10 x Knee ext 5# 2 sets 10 HS  curl 20# 2 sets 10 Leg press 20# 3 sets 10 Tband standing shld ext and row 2 sets 10 Tband seated trunk flex and ext  02/08/24 Amb outside 300 feet with curb sup-CGA without AD with cuing to increase stride as she was taking short shuffle steps esp as she fatigued Nustep L 6 10 min STS wt ball CGA 10 x Light HHA vectro taps Light  HHA stepping fwd and back over 2 in pole 3# HHA marching fwd and back 20 feet 2 x 3# HHA side stepping 20 feet 2 x  02/03/24 Nustep L 5 10 min BERG   36/56 Ball toss on airex CGA with 2 LOB STS from elevated mat 2 sets 5 on airex min A Obstacle course stepping on over and around various objects 6 in step taps 10 x each leg then alt CG-min A      02/01/24 Nustep L 5 10 min Amb HHA 5 min outside working on upright posture and cadence Trunk flex and ext 20 x blue tband STS wt ball 10x  HHA on airex stepping on and off fwd,SW and back 10 x each HS curl 25# 2 sets 10 Knee ext 5# 2 sets 10     01/25/24 Nustep L 5 STS wt ball CGA 10 x, cued to not use legs to brace on mat Standing on airex CGA shld ext and row 10 x Standing on airex HHA alt LE 20 x marching,SL hip flex,ext and abd Standing HHA marching fwd and back 20 feet 2 x each and side stepping 20 feet 2 x each Seated resisted trunk flex and ext 20 x Amb 500 feet HHA working on upright trunk, and cadence and she did very well- 2 brief standing rest breaks   01/18/24 Nustep L 5 Goals assessed and documented Knee ext 5# 2 set s10 HS curl 15# 2 sets 10 Resisted seated trunk ext 2 sets 10 Seated row 20# 10 x  15# 10 x 3# ankle wts HHA marcing fwd and back ward 15 feet 3 x each, side stepping 15 feet 3 x each 3# ankle wt HHA 10 x each SL hip flex,ext and abd  12/30/23 Nustep L 5 Goals assessed and documented STS 5x 16.54 sec Foam beam in // bars 3x side stepping and then tandem fwd and back CGA with light HHA STS with ball toss 10 x Ball toss on airex, only 1 LOB 2 sets 10 Knee  ext 5# 2 set s10 HS curl 15# 2 sets 10 [employer] 5# 1 lap each hand Resisted seated trunk ext 2 sets 10 Seated row 15# 2 sets 10     12/13/23 Nustep L 5 8 min  Knee ext 5# 2 set s10 HS curl 15# 2 sets 10 STS with 4# chest press 10x ADD ball squeeze 2x15 Hip abd w/annual resistance 2x15 Alt 4in box taps CGA 2x10  12/10/23 STS with 4# chest press 10x Assessed and documented goals Nustep L 5 Resisted gait 20# 3 x 4 ways- min A Knee ext 5# 2 set s10 HS curl 15# 2 sets 10  12/07/23 Resisted trunk flex and ext 20 x Seated resisted row and shld ext 2 sets 10 Nustep L 5 8 min Green tband HS curl 2 sets 10 3# LAQ 2 sets 10 3# HHA SL hip flex,ext and abd 10 x each- decreased RT LE mvmt ADD ball squeeze 15 x    12/02/23 Nustep L 5 BM for vacuuming to decrease strain on back Green tband HS curl,hip flex and clams seated 2 sets 10 ADD ball squeeze 2 sets 10 3# LAQ 2 sets 10 Red tband seated row and shld ext 2 sets 10 Tband trunk ext 2 sets 10 Standing with RW 3# hip ext and 10 x each      09/20/23:   Evaluation, explained to pt that long term management  of arthritic changes in hips in spine, should respond well to moderate exercise. Practiced gait with st cane, pt without any marked improvement in her stability with the use of the cane.  Therefore practiced with front wheeled walker, marked improvement in gait pattern . Recommended that she use a front wheeled walker which is more light weight and easier to maneuver in and out of car for outings, she is a high fall risk.  PATIENT EDUCATION:  Education details: POC, goals Person educated: Patient Education method: Explanation, Demonstration, Tactile cues, and Verbal cues Education comprehension: verbalized understanding and returned demonstration  HOME EXERCISE PROGRAM: Green t band hip abd/ER in sitting and green t band ankle pumps L .   ASSESSMENT: CLINICAL IMPRESSION:   added in TM today at 1 mph and she  did well but was fatigued at 3 min and definitely needed UE. Worked on postural strength to aid in upright posture with gait because she she fatigues she increases her fwd flexion. OBJECTIVE IMPAIRMENTS: decreased activity tolerance, decreased balance, decreased knowledge of condition, decreased knowledge of use of DME, decreased mobility, difficulty walking, decreased ROM, decreased strength, decreased safety awareness, impaired perceived functional ability, postural dysfunction, and pain.   ACTIVITY LIMITATIONS: carrying, lifting, bending, standing, squatting, transfers, bed mobility, and locomotion level  PARTICIPATION LIMITATIONS: meal prep, cleaning, laundry, shopping, community activity, and yard work  PERSONAL FACTORS: Age, Behavior pattern, Fitness, Past/current experiences, Time since onset of injury/illness/exacerbation, and 1-2 comorbidities: severe DJD B hips, lumbar region, neuropathy are also affecting patient's functional outcome.   REHAB POTENTIAL: Good  CLINICAL DECISION MAKING: Evolving/moderate complexity  EVALUATION COMPLEXITY: Moderate   GOALS: Goals reviewed with patient? Yes  SHORT TERM GOALS: Target date: 2 weeks 5 5/25 I HEP Baseline: Goal status: MET 10/26/23   LONG TERM GOALS: Target date:   Modified Oswestry Low Back Pain Disability Questionnaire: 29 / 50 = 58.0 %  Baseline:  Goal status: 11/11/23 progressing  and 11/18/23  11/30/23 progressing 12/10/23 progressing   12/30/23 progressing   01/18/24 progressing 02/10/24 MET  2.  Safe gait with appropriate device , over 350' for safe household distances, without balance loss REVISED GOAL: 500+ ft  Baseline:  Goal status: on going 10/26/23  progressing 11/04/23  and 11/11/23  Progressing 11/18/23  progressing 11/30/23 distance met but unsteady  12/10/23  amb without AD 250 feet, no LOB  but instability with lateral sway 12/30/23 progressing 250 feet then slows down and balance decreases 01/18/24 progressing  Progressing  and showing good improvements 02/01/24 02/10/24 progressing and 02/17/24. Progressing 03/10/24  03/16/24 met for distance but still moments of unsteadiness  03/23/24 progressing 04/04/24 MET   04/13/24 progressing   3.  5 x sit to stand 14.8 sec or less without balance loss REVISED GOAL: < 13s Baseline: 25 sec,with UE support B  required mod assist on final rep to avoid fall  Goal status: progressing 10/26/23  progressing 11/05/23  11/18/23 multi tries and posterior lean,progressing  11/30/23 LOB progressing  12/10/23 20 sec 12/30/23 progressing   01/18/24 18.5 sec with 1 x bracing knees 02/01/24 progressing uses knees to brace with 02/10/24 16 sec progressing  03/10/24 15 sec with no LOB or hesitation 03/16/24 14.4 sec MET    4. Will increase BERG score to 45 to demonstrate improved balance   Baseline: 36 REVISED GOAL: 48  Goal status: progressing 02/17/24  and 02/29/24 03/16/24 42/56 progressing  04/04/24  and 04/11/24    PLAN:  PT FREQUENCY: 2x/week  PT  DURATION: 12 weeks  PLANNED INTERVENTIONS: 97110-Therapeutic exercises, 97530- Therapeutic activity, [id number]- Neuromuscular re-education, 97535- Self Care, [id number]- Manual therapy, [id number]- Electrical stimulation (unattended), and 97033- Ionotophoresis 4mg /ml Dexamethasone   PLAN FOR NEXT SESSION:  progress balance and gait Lukah Goswami,ANGIE, PTA,  04/18/2024, 1:56 PM [facility] [address] [facility] [place], [place], [address] Phone: [phone]   Fax:  [phone]

## 2024-04-19 ENCOUNTER — Ambulatory Visit: Admitting: Family Medicine

## 2024-04-20 ENCOUNTER — Ambulatory Visit: Admitting: Physical Therapy

## 2024-04-20 DIAGNOSIS — R262 Difficulty in walking, not elsewhere classified: Secondary | ICD-10-CM

## 2024-04-20 DIAGNOSIS — G8929 Other chronic pain: Secondary | ICD-10-CM | POA: Diagnosis not present

## 2024-04-20 DIAGNOSIS — M5442 Lumbago with sciatica, left side: Secondary | ICD-10-CM | POA: Diagnosis not present

## 2024-04-20 DIAGNOSIS — M5441 Lumbago with sciatica, right side: Secondary | ICD-10-CM | POA: Diagnosis not present

## 2024-04-20 DIAGNOSIS — R293 Abnormal posture: Secondary | ICD-10-CM

## 2024-04-20 NOTE — Therapy (Addendum)
 OUTPATIENT PHYSICAL THERAPY LOWER EXTREMITY .         Patient Name: Margaret [name] MRN: [id number] DOB:Oct 04, 1940, 83 y.o., female Today's Date: 04/20/2024  END OF SESSION:  PT End of Session - 04/20/24 1459     Visit Number 38    Date for Recertification  05/05/24    PT Start Time 1450    PT Stop Time 1530    PT Time Calculation (min) 40 min            Past Medical History:  Diagnosis Date   Anxiety    Arthritis    Cancer (HCC)    lt. foot melanoma   Chronic kidney disease    lt. kidney cyst   Coronary artery disease    GERD (gastroesophageal reflux disease)    History of healed stress fracture 2013   bilateral feet   Hx of phlebitis    reports remote hx of superfical blood clots never anticoagulated   Hypertension    Osteopenia    Pneumonia    Past Surgical History:  Procedure Laterality Date   CATARACT EXTRACTION W/ INTRAOCULAR LENS IMPLANT Bilateral    CHOLECYSTECTOMY N/A 09/19/2016   Procedure: LAPAROSCOPIC CHOLECYSTECTOMY POSSIBLE INTRAOPERATIVE CHOLANGIOGRAM;  Surgeon: Donnice Bury, MD;  Location: [facility] OR;  Service: General;  Laterality: N/A;   ERCP N/A 09/18/2016   Procedure: ENDOSCOPIC RETROGRADE CHOLANGIOPANCREATOGRAPHY (ERCP);  Surgeon: Victory LITTIE Legrand DOUGLAS, MD;  Location: [facility] ENDOSCOPY;  Service: Endoscopy;  Laterality: N/A;   FOOT SURGERY     patient reports four foot surgeries   FOOT SURGERY Left    bunion, hammer toe surgery   FOOT SURGERY Right    shaved bunion, hammer toe correction   HERNIA REPAIR     KNEE ARTHROSCOPY Right 06/02/1995   REPLACEMENT TOTAL KNEE Left    REPLACEMENT TOTAL KNEE Left 10/30/2012   RETINAL DETACHMENT SURGERY     RETINAL DETACHMENT SURGERY Left 06/01/2006   ROBOTIC ASSITED PARTIAL NEPHRECTOMY Left 06/03/2022   Procedure: XI ROBOTIC ASSITED PARTIAL NEPHRECTOMY WITH ULTRASOUND AND VENTRAL HERNIA REPAIR;  Surgeon: Alvaro Hummer, MD;  Location: [facility] ORS;  Service: Urology;  Laterality: Left;  3 HRS    THYROIDECTOMY, PARTIAL Right    THYROIDECTOMY, PARTIAL  06/02/2003   TOTAL KNEE ARTHROPLASTY Right 11/13/2019   Procedure: TOTAL KNEE ARTHROPLASTY SDDC;  Surgeon: Melodi Lerner, MD;  Location: [facility] ORS;  Service: Orthopedics;  Laterality: Right;    Patient Active Problem List   Diagnosis Date Noted   Renal mass 06/03/2022   Open wound of left side of back 01/20/2021   Lipoma of back 11/18/2020   Lymphedema 11/18/2020   Melanocytic nevi of right upper limb, including shoulder 11/18/2020   Personal history of malignant melanoma of skin 11/18/2020   Sebaceous cyst 11/18/2020   Seborrheic dermatitis 11/18/2020   Pre-diabetes 04/03/2020   OA (osteoarthritis) of knee 11/13/2019   Total knee replacement status, 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   Pain in right knee 08/03/2019   Fluid collection at surgical site 10/20/2018   Encounter for postoperative examination after surgery for malignant neoplasm 09/30/2018   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/2018   Plantar fasciitis of right foot 02/03/2017   Porokeratosis 02/03/2017   Acute gallstone pancreatitis    Calculus of gallbladder with acute cholecystitis and obstruction    LFT elevation    Stress fracture 01/01/2016   Insomnia 12/16/2015   Peripheral neuropathy 10/18/2015   Renal insufficiency 03/06/2015   HTN (hypertension) 03/06/2015   Osteopenia 03/06/2015  GERD (gastroesophageal reflux disease) 03/06/2015   Vaginal pessary present 03/06/2015   Metatarsalgia 07/13/2012   Increased frequency of urination 04/13/2012   DDD (degenerative disc disease), cervical 01/19/2012   Knee stiffness 01/19/2012   Low back pain 01/19/2012   Neck pain 01/19/2012   Pain of hand 01/19/2012   Hallux valgus 09/20/2011   Hyperkeratosis 08/13/2011   Anxiety disorder 07/06/2011   Depression 07/06/2011   Diverticulosis of large intestine 07/06/2011   Non-toxic multinodular goiter 07/06/2011   Atrophic vaginitis 10/24/2010   Dermatographic urticaria  09/17/2010   Inflamed seborrheic keratosis 09/17/2010   Closed fracture of fifth metatarsal bone 07/10/2010   Diagnosis unknown 02/27/2010   Hip pain 06/08/2008   Pulmonary nodule seen on imaging study 06/08/2008    PCP: Watt Raisin, MD  REFERRING PROVIDER: Charlie Dolores, MD, [facility]  REFERRING DIAG: degeneration of discs of lumbar spine  THERAPY DIAG:  Chronic bilateral low back pain with right-sided sciatica  Difficulty in walking, not elsewhere classified  Posture imbalance  Chronic bilateral low back pain with left-sided sciatica  Rationale for Evaluation and Treatment: Rehabilitation  ONSET DATE: chronic  SUBJECTIVE:   SUBJECTIVE STATEMENT   doing okay  PERTINENT HISTORY: Saw orthopedist, took prednisone, which didn't help, orthopedist may want to do MRI, referred to PT by both PCP and orthopedist, also B degenerative arthritis hips   PAIN:  Are you having pain? 4/10 back FEET are bad  PRECAUTIONS: Fall  RED FLAGS: None   WEIGHT BEARING RESTRICTIONS: No  FALLS:  Has patient fallen in last 6 months? Yes. Number of falls several  LIVING ENVIRONMENT: Lives with: lives with their family and lives with their daughter Lives in: House/apartment Stairs: Yes: Internal: one flight steps; on left going up Has following equipment at home: Environmental Consultant - 4 wheeled  OCCUPATION: retired  PLOF: Independent and Independent with basic ADLs  PATIENT GOALS: reduce pain hips and lower back so I can tolerate more activity  NEXT MD VISIT: not scheduled yet  OBJECTIVE:  Note: Objective measures were completed at Evaluation unless otherwise noted.  DIAGNOSTIC FINDINGS: X-ray done 11/23; IMPRESSION: 1. 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 2. Lower lumbar spondylosis.  PATIENT SURVEYS:  Modified Oswestry Modified Oswestry Low Back Pain Disability Questionnaire: 29 / 50 = 58.0 %   COGNITION: Overall cognitive status: Within  functional limits for tasks assessed     SENSATION: Light touch: WFL  EDEMA:  None noted   MUSCLE LENGTH: Hamstrings: Right wfl deg; Left wfl deg Debby test: Right wfl deg; Left wfl deg  POSTURE: scoliotic posture noted concavity L lumbar, pronounced thoracic kyphosis  PALPATION: Not tender with light palpation B gr trochanters hips  LOWER EXTREMITY ROM:B hip ROM wnl, able to achieve tailor position each leg in sitting  LUMBAR ROM: FB limited 15%, also risk of falling pt needed UE support Lumbar ext, 25% with marked side bending to L with this motion SB B 35% B with central lower back pain LOWER EXTREMITY MMT:  MMT Right eval Left eval  Hip flexion    Hip extension    Hip abduction    Hip adduction    Hip internal rotation    Hip external rotation    Knee flexion    Knee extension    Ankle dorsiflexion    Ankle plantarflexion    Ankle inversion    Ankle eversion     (Blank rows = not tested)   FUNCTIONAL TESTS:  5 times sit to stand: 25 sec, required mod assist of PT to avoid falling backwards into chair with final attempt  GAIT: Distance walked: in clinic up to 60  Assistive device utilized: Single point cane, Walker - 2 wheeled, and none Level of assistance: Min A without device and with cane Comments: very unstable without device                                                                                                                                 TREATMENT DATE:   04/20/24 Amb outside 7 min with 2 standing rest breaks for LB pain/fatigue Light HHA for curb and 1 LOB on acorns Nustep L 6 10 min [place] working on balance  and foot clearance with stepping fwd and back, laterally and stepping Ball toss solid surfcae without LOB STS without UE on airex with ball toss CGA( slight elevated mat and at times multi tries_ Red tabnd hip kicks 3 way 10 x   04/18/24 TM 1 mph CGA with UE needed 3 min UBE standing 1 min each way for back  strength Black tband trunk flex and ext 2 sets 10 Seated row 20# 2 sets 10 Lat pull down 20# 2 sets 10 HS curl 2 sets 10 20# Knee ext 5 # 2 sets 10  Nustep L 5 10 min STS with wt ball   04/13/24 Amb 450 feet ( 4 1/2 laps inside) 2 laps holding 6# hand wt for strength and balance Black tband trunk flexion and ext 2 sets 10 Seated row 20# 2 sets 10- for posture and back strength Ball kicking and toss 25x CGA , LOB but inly 1 x needed help to regain Nustep L 6 10 min Resisted gait fwd and backward 20# 5 x each with no LOB   04/11/24 Nustep L 5 10 min Black tband trunk flexion and ext 20 x Seated row 20# 2 sets 10 STS on airex 10 x- CGA Walking ball toss fwd and laterally 6 inch alt step taps 20 x 2 sets 6 inch step ups 10 x BIL with UE support Resisted gait fwd and back 5 x Resisted gait with side stepping over 1 inch stick  04/06/24 Resisted gait 30 # 5 x fwd and back CGA, laterally min a with decreased eccentric control 3 x each Nustep L 5 10 min 6 inch alt step taps 20 x 2 sets 6 inch step ups 10 x BIL with UE support Kicking ball min A Ball toss on airex- CGA 1 LOB sitting back on mat Min a vector taps HS curl 20# 2 sets 10 Knee ext 10# 2 sets 10 STS wt ball 10 x  04/04/24 BERG 45/56 administered and assessed other goals Nustep L 5 10 min 3# single HHS 6 inch step tap fwd and laterally BIL 3# HHA marching fwd and back 15 feet 3 x then laterally 3# stepping fwd  over  foam beams with min A 3# laterally stepping over foam beam min A  03/23/24 Nustep L 5 10 min STS with wt ball 10 x chest press HS curl 25# 2 sets 10 Knee ext 10# 2 sets 10 Black tband 2 sets 10 flex and ext Seated row 20# 2 sets 10 Resisted gait 4 way 5 x each 20#   03/21/24 Walk outside 4 min working on cadence and stride. 1 standing rest break Resisted gait 4 way 5 x each 20# and did without LOB at lower resistance More square stepping with HHA.more fear of stepping and falling Nustep L  5 10 min STS with wt ball chest press Standing on airex ball toss CGA  03/16/24 Nustep L 5 10 min BERG 42/56 STS 14.4 sec 5 x Foam beam in // bars 5 x tandem fwd and back,laterally Resisted gait 30# 5 x fwd and 5 x backward, 3 x each side- struggled with lateral mvmt  03/14/24 Nustep L 5 10 min Amb 6 min outside CGA no AD working on cadence and confidence. HHS with step ups on curbs and CGA with step down with encouragement, very fearful. Decreased balance with decline and struggled with incline and increased LBP d/t fwd flex, last 45 sec needed HHA d/t fatigue Cable pulleys 10# standing row 10x then shld ext 10x Seated row 2 sets 10 Lat pull down 2 sets 10 STS with 4# chest press 10 x 4# standing chest press and OH press 10 x   03/10/24 Nustep L 5 10 min STS wt ball chest press 10 x 7# farmers carry 1 lap each hand Step up with opp leg ext 10 x each with UE Step up laterally 10 x each with UE 30# 3 x fwd and backward Standing tband shld ext and row 2 sets 10 Standing tband hip ext and abd 2 sets 10  03/07/24 Nustep L 5 10 min Seated roling ball out for back stretch Seated resisted trunk ext Standing tband shld ext and row 2 sets 10 Standing tband hip ext and abd 2 sets 10  02/29/24 Nustep L 6 10 min Stepping over various heights and directions- HHA with LOB STS with wt ball chest press 10x Farmers carry 7# 1 lap each hand Resisted trunk ext 20 x HHA red tband SL hip flex,ext and abd 10 x Green tband shld ext and row 2 sets 10     02/22/24 Nustep L 6 10 min Resisted trunk ext 20 x Feet on ball bride, KTC, obl 10 x Iso abdominals 10 x hold 3 sec Green tband clams and hip flex 2 sets 10 Gentle PROM LE and trunk Red tband standing shld ext and row 2 sets 10  02/17/24 Nustep L 6 10 min In // bars foam beam fwd and back tandem 3 x then side stepping 3 x , light HHA STS from standard chair 2 sets 5 cg-min A 6 inch alt step tap 2 sets 10- LOB 2 not clearing foot   20# resisted gait fwd and back 5 x , then 3 x each side CG-min A with 2 LOB Aura carry 6#  02/15/24 Nustep L 6 10 min STS with wt ball chest press 10 x Resisted trunk flex and ext 20 x Seated row 20# 2 sets 10 Knee ext 5# 2 sets 10 HS curl 20# 2 sets 10 Leg press 20# 3 sets 10   02/10/24 Nustep L 6 10 min STS wt ball chest press 10 x Knee ext  5# 2 sets 10 HS curl 20# 2 sets 10 Leg press 20# 3 sets 10 Tband standing shld ext and row 2 sets 10 Tband seated trunk flex and ext  02/08/24 Amb outside 300 feet with curb sup-CGA without AD with cuing to increase stride as she was taking short shuffle steps esp as she fatigued Nustep L 6 10 min STS wt ball CGA 10 x Light HHA vectro taps Light HHA stepping fwd and back over 2 in pole 3# HHA marching fwd and back 20 feet 2 x 3# HHA side stepping 20 feet 2 x  02/03/24 Nustep L 5 10 min BERG   36/56 Ball toss on airex CGA with 2 LOB STS from elevated mat 2 sets 5 on airex min A Obstacle course stepping on over and around various objects 6 in step taps 10 x each leg then alt CG-min A      02/01/24 Nustep L 5 10 min Amb HHA 5 min outside working on upright posture and cadence Trunk flex and ext 20 x blue tband STS wt ball 10x  HHA on airex stepping on and off fwd,SW and back 10 x each HS curl 25# 2 sets 10 Knee ext 5# 2 sets 10     01/25/24 Nustep L 5 STS wt ball CGA 10 x, cued to not use legs to brace on mat Standing on airex CGA shld ext and row 10 x Standing on airex HHA alt LE 20 x marching,SL hip flex,ext and abd Standing HHA marching fwd and back 20 feet 2 x each and side stepping 20 feet 2 x each Seated resisted trunk flex and ext 20 x Amb 500 feet HHA working on upright trunk, and cadence and she did very well- 2 brief standing rest breaks   01/18/24 Nustep L 5 Goals assessed and documented Knee ext 5# 2 set s10 HS curl 15# 2 sets 10 Resisted seated trunk ext 2 sets 10 Seated row 20# 10 x  15# 10  x 3# ankle wts HHA marcing fwd and back ward 15 feet 3 x each, side stepping 15 feet 3 x each 3# ankle wt HHA 10 x each SL hip flex,ext and abd  12/30/23 Nustep L 5 Goals assessed and documented STS 5x 16.54 sec Foam beam in // bars 3x side stepping and then tandem fwd and back CGA with light HHA STS with ball toss 10 x Ball toss on airex, only 1 LOB 2 sets 10 Knee ext 5# 2 set s10 HS curl 15# 2 sets 10 [employer] 5# 1 lap each hand Resisted seated trunk ext 2 sets 10 Seated row 15# 2 sets 10     12/13/23 Nustep L 5 8 min  Knee ext 5# 2 set s10 HS curl 15# 2 sets 10 STS with 4# chest press 10x ADD ball squeeze 2x15 Hip abd w/annual resistance 2x15 Alt 4in box taps CGA 2x10  12/10/23 STS with 4# chest press 10x Assessed and documented goals Nustep L 5 Resisted gait 20# 3 x 4 ways- min A Knee ext 5# 2 set s10 HS curl 15# 2 sets 10  12/07/23 Resisted trunk flex and ext 20 x Seated resisted row and shld ext 2 sets 10 Nustep L 5 8 min Green tband HS curl 2 sets 10 3# LAQ 2 sets 10 3# HHA SL hip flex,ext and abd 10 x each- decreased RT LE mvmt ADD ball squeeze 15 x    12/02/23 Nustep L  5 BM for vacuuming to decrease strain on back Green tband HS curl,hip flex and clams seated 2 sets 10 ADD ball squeeze 2 sets 10 3# LAQ 2 sets 10 Red tband seated row and shld ext 2 sets 10 Tband trunk ext 2 sets 10 Standing with RW 3# hip ext and 10 x each      09/20/23:   Evaluation, explained to pt that long term management of arthritic changes in hips in spine, should respond well to moderate exercise. Practiced gait with st cane, pt without any marked improvement in her stability with the use of the cane.  Therefore practiced with front wheeled walker, marked improvement in gait pattern . Recommended that she use a front wheeled walker which is more light weight and easier to maneuver in and out of car for outings, she is a high fall risk.  PATIENT EDUCATION:   Education details: POC, goals Person educated: Patient Education method: Explanation, Demonstration, Tactile cues, and Verbal cues Education comprehension: verbalized understanding and returned demonstration  HOME EXERCISE PROGRAM: Green t band hip abd/ER in sitting and green t band ankle pumps L .   ASSESSMENT: CLINICAL IMPRESSION:  Amb outside 7 min with 2 standing rest breaks for LB pain/fatigue. Light HHA for curb and 1 LOB on acorns. LOB and still fearful with navigating over obstacles,fearful of falling   OBJECTIVE IMPAIRMENTS: decreased activity tolerance, decreased balance, decreased knowledge of condition, decreased knowledge of use of DME, decreased mobility, difficulty walking, decreased ROM, decreased strength, decreased safety awareness, impaired perceived functional ability, postural dysfunction, and pain.   ACTIVITY LIMITATIONS: carrying, lifting, bending, standing, squatting, transfers, bed mobility, and locomotion level  PARTICIPATION LIMITATIONS: meal prep, cleaning, laundry, shopping, community activity, and yard work  PERSONAL FACTORS: Age, Behavior pattern, Fitness, Past/current experiences, Time since onset of injury/illness/exacerbation, and 1-2 comorbidities: severe DJD B hips, lumbar region, neuropathy are also affecting patient's functional outcome.   REHAB POTENTIAL: Good  CLINICAL DECISION MAKING: Evolving/moderate complexity  EVALUATION COMPLEXITY: Moderate   GOALS: Goals reviewed with patient? Yes  SHORT TERM GOALS: Target date: 2 weeks 5 5/25 I HEP Baseline: Goal status: MET 10/26/23   LONG TERM GOALS: Target date:   Modified Oswestry Low Back Pain Disability Questionnaire: 29 / 50 = 58.0 %  Baseline:  Goal status: 11/11/23 progressing  and 11/18/23  11/30/23 progressing 12/10/23 progressing   12/30/23 progressing   01/18/24 progressing 02/10/24 MET  2.  Safe gait with appropriate device , over 350' for safe household distances, without balance  loss REVISED GOAL: 500+ ft  Baseline:  Goal status: on going 10/26/23  progressing 11/04/23  and 11/11/23  Progressing 11/18/23  progressing 11/30/23 distance met but unsteady  12/10/23  amb without AD 250 feet, no LOB  but instability with lateral sway 12/30/23 progressing 250 feet then slows down and balance decreases 01/18/24 progressing  Progressing and showing good improvements 02/01/24 02/10/24 progressing and 02/17/24. Progressing 03/10/24  03/16/24 met for distance but still moments of unsteadiness  03/23/24 progressing 04/04/24 MET   04/13/24 progressing   3.  5 x sit to stand 14.8 sec or less without balance loss REVISED GOAL: < 13s Baseline: 25 sec,with UE support B  required mod assist on final rep to avoid fall  Goal status: progressing 10/26/23  progressing 11/05/23  11/18/23 multi tries and posterior lean,progressing  11/30/23 LOB progressing  12/10/23 20 sec 12/30/23 progressing   01/18/24 18.5 sec with 1 x bracing knees 02/01/24 progressing uses knees to brace  with 02/10/24 16 sec progressing  03/10/24 15 sec with no LOB or hesitation 03/16/24 14.4 sec MET   04/20/24 13.8 sec- new goals < 13  4. Will increase BERG score to 45 to demonstrate improved balance   Baseline: 36 REVISED GOAL: 48  Goal status: progressing 02/17/24  and 02/29/24 03/16/24 42/56 progressing  04/04/24  and 04/11/24 and 04/20/24    PLAN:  PT FREQUENCY: 2x/week  PT DURATION: 12 weeks  PLANNED INTERVENTIONS: 97110-Therapeutic exercises, 97530- Therapeutic activity, 97112- Neuromuscular re-education, 97535- Self Care, [id number]- Manual therapy, [id number]- Electrical stimulation (unattended), and 97033- Ionotophoresis 4mg /ml Dexamethasone   PLAN FOR NEXT SESSION:  D/C next session with HEP Boluwatife Mutchler,ANGIE, PTA,  04/20/2024, 3:00 PM [place] [facility] [facility] [place], [place], [address] Phone: [phone]   Fax:  [phone]  05/16/24  Patient did not show up for the last  appt 12/5, will D/C

## 2024-04-22 ENCOUNTER — Emergency Department (HOSPITAL_COMMUNITY)
Admission: EM | Admit: 2024-04-22 | Discharge: 2024-04-23 | Disposition: A | Discharge status: Home / Self Care | Attending: Emergency Medicine | Admitting: Emergency Medicine

## 2024-04-22 ENCOUNTER — Emergency Department (HOSPITAL_COMMUNITY)

## 2024-04-22 ENCOUNTER — Other Ambulatory Visit: Payer: Self-pay

## 2024-04-22 DIAGNOSIS — R112 Nausea with vomiting, unspecified: Secondary | ICD-10-CM | POA: Diagnosis not present

## 2024-04-22 DIAGNOSIS — K429 Umbilical hernia without obstruction or gangrene: Secondary | ICD-10-CM | POA: Diagnosis not present

## 2024-04-22 DIAGNOSIS — I2510 Atherosclerotic heart disease of native coronary artery without angina pectoris: Secondary | ICD-10-CM | POA: Insufficient documentation

## 2024-04-22 DIAGNOSIS — K469 Unspecified abdominal hernia without obstruction or gangrene: Secondary | ICD-10-CM | POA: Diagnosis not present

## 2024-04-22 DIAGNOSIS — N189 Chronic kidney disease, unspecified: Secondary | ICD-10-CM | POA: Insufficient documentation

## 2024-04-22 DIAGNOSIS — R109 Unspecified abdominal pain: Secondary | ICD-10-CM | POA: Diagnosis not present

## 2024-04-22 DIAGNOSIS — R1013 Epigastric pain: Secondary | ICD-10-CM

## 2024-04-22 DIAGNOSIS — K567 Ileus, unspecified: Secondary | ICD-10-CM

## 2024-04-22 DIAGNOSIS — K5730 Diverticulosis of large intestine without perforation or abscess without bleeding: Secondary | ICD-10-CM | POA: Diagnosis not present

## 2024-04-22 DIAGNOSIS — I129 Hypertensive chronic kidney disease with stage 1 through stage 4 chronic kidney disease, or unspecified chronic kidney disease: Secondary | ICD-10-CM | POA: Insufficient documentation

## 2024-04-22 DIAGNOSIS — Z79899 Other long term (current) drug therapy: Secondary | ICD-10-CM | POA: Insufficient documentation

## 2024-04-22 DIAGNOSIS — R52 Pain, unspecified: Secondary | ICD-10-CM | POA: Diagnosis not present

## 2024-04-22 DIAGNOSIS — R8289 Other abnormal findings on cytological and histological examination of urine: Secondary | ICD-10-CM | POA: Diagnosis not present

## 2024-04-22 DIAGNOSIS — R0902 Hypoxemia: Secondary | ICD-10-CM | POA: Diagnosis not present

## 2024-04-22 DIAGNOSIS — R1084 Generalized abdominal pain: Secondary | ICD-10-CM | POA: Diagnosis not present

## 2024-04-22 DIAGNOSIS — I10 Essential (primary) hypertension: Secondary | ICD-10-CM | POA: Diagnosis not present

## 2024-04-22 LAB — CBC WITH DIFFERENTIAL/PLATELET
Abs Immature Granulocytes: 0.04 K/uL (ref 0.00–0.07)
Basophils Absolute: 0 K/uL (ref 0.0–0.1)
Basophils Relative: 0 %
Eosinophils Absolute: 0 K/uL (ref 0.0–0.5)
Eosinophils Relative: 0 %
HCT: 45.4 % (ref 36.0–46.0)
Hemoglobin: 15.1 g/dL — ABNORMAL HIGH (ref 12.0–15.0)
Immature Granulocytes: 0 %
Lymphocytes Relative: 10 %
Lymphs Abs: 1.2 K/uL (ref 0.7–4.0)
MCH: 32.1 pg (ref 26.0–34.0)
MCHC: 33.3 g/dL (ref 30.0–36.0)
MCV: 96.4 fL (ref 80.0–100.0)
Monocytes Absolute: 0.3 K/uL (ref 0.1–1.0)
Monocytes Relative: 2 %
Neutro Abs: 10.8 K/uL — ABNORMAL HIGH (ref 1.7–7.7)
Neutrophils Relative %: 88 %
Platelets: 271 K/uL (ref 150–400)
RBC: 4.71 MIL/uL (ref 3.87–5.11)
RDW: 13.4 % (ref 11.5–15.5)
WBC: 12.3 K/uL — ABNORMAL HIGH (ref 4.0–10.5)
nRBC: 0 % (ref 0.0–0.2)

## 2024-04-22 LAB — URINALYSIS, ROUTINE W REFLEX MICROSCOPIC
Bilirubin Urine: NEGATIVE
Glucose, UA: NEGATIVE mg/dL
Hgb urine dipstick: NEGATIVE
Ketones, ur: 5 mg/dL — AB
Nitrite: NEGATIVE
Protein, ur: NEGATIVE mg/dL
Specific Gravity, Urine: 1.011 (ref 1.005–1.030)
pH: 6 (ref 5.0–8.0)

## 2024-04-22 LAB — COMPREHENSIVE METABOLIC PANEL WITH GFR
ALT: 18 U/L (ref 0–44)
AST: 32 U/L (ref 15–41)
Albumin: 4.8 g/dL (ref 3.5–5.0)
Alkaline Phosphatase: 86 U/L (ref 38–126)
Anion gap: 15 (ref 5–15)
BUN: 24 mg/dL — ABNORMAL HIGH (ref 8–23)
CO2: 26 mmol/L (ref 22–32)
Calcium: 10.7 mg/dL — ABNORMAL HIGH (ref 8.9–10.3)
Chloride: 101 mmol/L (ref 98–111)
Creatinine, Ser: 1 mg/dL (ref 0.44–1.00)
GFR, Estimated: 56 mL/min — ABNORMAL LOW (ref >60)
Glucose, Bld: 135 mg/dL — ABNORMAL HIGH (ref 70–99)
Potassium: 3.9 mmol/L (ref 3.5–5.1)
Sodium: 142 mmol/L (ref 135–145)
Total Bilirubin: 0.4 mg/dL (ref 0.0–1.2)
Total Protein: 7.7 g/dL (ref 6.5–8.1)

## 2024-04-22 LAB — TROPONIN T, HIGH SENSITIVITY: Troponin T High Sensitivity: 21 ng/L — ABNORMAL HIGH (ref 0–19)

## 2024-04-22 LAB — LIPASE, BLOOD: Lipase: 39 U/L (ref 11–51)

## 2024-04-22 MED ORDER — ALUM & MAG HYDROXIDE-SIMETH 200-200-20 MG/5ML PO SUSP
30.0000 mL | Freq: Once | ORAL | Status: AC
  Administered 2024-04-22: 30 mL via ORAL
  Filled 2024-04-22: qty 30

## 2024-04-22 MED ORDER — FAMOTIDINE IN NACL 20-0.9 MG/50ML-% IV SOLN
20.0000 mg | Freq: Once | INTRAVENOUS | Status: AC
Start: 2024-04-22 — End: 2024-04-22
  Administered 2024-04-22: 20 mg via INTRAVENOUS
  Filled 2024-04-22: qty 50

## 2024-04-22 MED ORDER — IOHEXOL 300 MG/ML  SOLN
100.0000 mL | Freq: Once | INTRAMUSCULAR | Status: AC | PRN
  Administered 2024-04-22: 100 mL via INTRAVENOUS

## 2024-04-22 MED ORDER — PANTOPRAZOLE SODIUM 40 MG IV SOLR
40.0000 mg | Freq: Once | INTRAVENOUS | Status: AC
  Administered 2024-04-22: 40 mg via INTRAVENOUS
  Filled 2024-04-22: qty 10

## 2024-04-22 NOTE — ED Provider Notes (Signed)
 [place] EMERGENCY DEPARTMENT AT [facility] Provider Note   CSN: [id number] Arrival date & time: 04/22/24  8069     Patient presents with: No chief complaint on file.   Margaret [name] is a 83 y.o. female. HPI Patient is an 83 year old female presenting ED today for concerns for epigastric pain that has been ongoing x 2 days, worse after meals noting that she had been diagnosed with a hiatal hernia and was told to come to emergency department for any nausea or vomiting.  Reported that she has had 3 episodes of nonbloody, nonbilious vomiting today.  Has had persistent epigastric pain that is radiating over her whole abdomen.  Pain has been relieved with omeprazole  at home.  However is worse than her normal.  Reports as burning.  Noted to have had a normal bowel movement before arriving to the emergency department today.  No sick contacts.   Previous medical history of HTN, CKD, GERD, hiatal hernia and umbilical hernia, CAD.  Denies fever, headache, vision changes, chest pain, shortness of breath, hematemesis, diarrhea, melena, hematochezia, dysuria, hematuria, vaginal discharge, vaginal bleeding, lower leg swelling, rashes.    Prior to Admission medications   Medication Sig Start Date End Date Taking? Authorizing Provider  ondansetron  (ZOFRAN ) 4 MG tablet Take 1 tablet (4 mg total) by mouth every 6 (six) hours. 04/23/24  Yes Fountain Derusha S, PA-C  acetaminophen  (TYLENOL ) 500 MG tablet 1000 mg by oral route.    [provider]  diclofenac Sodium (VOLTAREN) 1 % GEL Apply 2 g topically 4 (four) times daily as needed (pain).    [provider]  gabapentin  (NEURONTIN ) 300 MG capsule Take 1 capsule (300 mg total) by mouth 4 (four) times daily. 04/17/24   Gershon Donnice SAUNDERS, DPM  hydrochlorothiazide  (HYDRODIURIL ) 12.5 MG tablet Take 1 tablet (12.5 mg total) by mouth daily as needed. 01/28/24   Copland, Harlene BROCKS, MD  HYDROcodone -acetaminophen  (NORCO)  7.5-325 MG tablet Take 1 tablet by mouth every 8 (eight) hours as needed for moderate pain (pain score 4-6). 04/17/24   Copland, Jessica C, MD  ketoconazole (NIZORAL) 2 % cream APPLY TO THE AFFECTED AREA EVERY DAY FOR 15 DAYS    [provider]  metoprolol  succinate (TOPROL -XL) 25 MG 24 hr tablet TAKE 1 TABLET BY MOUTH DAILY .TAKE WITH OR IMMEDIATELY FOLLOWING A MEAL 04/14/24   Copland, Harlene BROCKS, MD  NONFORMULARY OR COMPOUNDED ITEM [facility]:  Peripheral Neuropathy - Bupivacaine  1%, Doxepin 3%, Gabapentin  6%, Pentoxifylline 3%, Topiramate 1%. Apply 1-2 grams to affected area 3-4 times daily. 02/22/24   Gershon Donnice SAUNDERS, DPM  omeprazole  (PRILOSEC) 20 MG capsule Take 1 capsule (20 mg total) by mouth daily. 03/06/24   Copland, Harlene BROCKS, MD  pravastatin  (PRAVACHOL ) 20 MG tablet TAKE 1 TABLET BY MOUTH DAILY 01/03/24   Copland, Harlene BROCKS, MD    Allergies: Keflex  [cephalexin ], Percocet [oxycodone -acetaminophen ], and Clindamycin /lincomycin    Review of Systems  Gastrointestinal:  Positive for abdominal pain, nausea and vomiting.  All other systems reviewed and are negative.   Updated Vital Signs BP (!) 162/115   Pulse 90   Temp 98.3 F (36.8 C) (Oral)   Resp 19   SpO2 95%   Physical Exam Vitals and nursing note reviewed.  Constitutional:      General: She is not in acute distress.    Appearance: Normal appearance. She is not ill-appearing or diaphoretic.  HENT:     Head: Normocephalic and atraumatic.  Eyes:  General: No scleral icterus.       Right eye: No discharge.        Left eye: No discharge.     Extraocular Movements: Extraocular movements intact.     Conjunctiva/sclera: Conjunctivae normal.  Cardiovascular:     Rate and Rhythm: Normal rate and regular rhythm.     Pulses: Normal pulses.     Heart sounds: Normal heart sounds. No murmur heard.    No friction rub. No gallop.  Pulmonary:     Effort: Pulmonary effort is normal. No respiratory distress.      Breath sounds: No stridor. No wheezing, rhonchi or rales.  Chest:     Chest wall: No tenderness.  Abdominal:     General: Abdomen is flat. There is no distension.     Palpations: Abdomen is soft.     Tenderness: There is abdominal tenderness (Notable does have some generalized abdominal tenderness localizing to the epigastric region.). There is no right CVA tenderness, left CVA tenderness, guarding or rebound.     Hernia: A hernia (Reducible ventral hernia noted on examination.) is present.  Musculoskeletal:        General: No swelling, deformity or signs of injury.     Cervical back: Normal range of motion. No rigidity.     Right lower leg: No edema.     Left lower leg: No edema.  Skin:    General: Skin is warm and dry.     Findings: No bruising, erythema or lesion.  Neurological:     General: No focal deficit present.     Mental Status: She is alert and oriented to person, place, and time. Mental status is at baseline.     Sensory: No sensory deficit.     Motor: No weakness.  Psychiatric:        Mood and Affect: Mood normal.     (all labs ordered are listed, but only abnormal results are displayed) Labs Reviewed  CBC WITH DIFFERENTIAL/PLATELET - Abnormal; Notable for the following components:      Result Value   WBC 12.3 (*)    Hemoglobin 15.1 (*)    Neutro Abs 10.8 (*)    All other components within normal limits  COMPREHENSIVE METABOLIC PANEL WITH GFR - Abnormal; Notable for the following components:   Glucose, Bld 135 (*)    BUN 24 (*)    Calcium  10.7 (*)    GFR, Estimated 56 (*)    All other components within normal limits  URINALYSIS, ROUTINE W REFLEX MICROSCOPIC - Abnormal; Notable for the following components:   APPearance HAZY (*)    Ketones, ur 5 (*)    Leukocytes,Ua LARGE (*)    Bacteria, UA RARE (*)    All other components within normal limits  TROPONIN T, HIGH SENSITIVITY - Abnormal; Notable for the following components:   Troponin T High Sensitivity 21  (*)    All other components within normal limits  URINE CULTURE  LIPASE, BLOOD  TROPONIN T, HIGH SENSITIVITY    EKG: EKG Interpretation Date/Time:  Saturday April 22 2024 22:12:51 EST Ventricular Rate:  69 PR Interval:  164 QRS Duration:  96 QT Interval:  421 QTC Calculation: 451 R Axis:   -34  Text Interpretation: Sinus rhythm Left axis deviation Abnormal T, consider ischemia, anterior leads Confirmed by Geraldene Hamilton [phone]) on 04/22/2024 10:26:57 PM  Radiology: CT ABDOMEN PELVIS W CONTRAST Result Date: 04/22/2024 EXAM: CT ABDOMEN AND PELVIS WITH CONTRAST 04/22/2024 11:33:28 PM TECHNIQUE: CT of  the abdomen and pelvis was performed with the administration of 100 mL of iohexol  (OMNIPAQUE ) 300 MG/ML solution. Multiplanar reformatted images are provided for review. Automated exposure control, iterative reconstruction, and/or weight-based adjustment of the mA/kV was utilized to reduce the radiation dose to as low as reasonably achievable. COMPARISON: 12/13/2023 CLINICAL HISTORY: Abdominal pain, acute, nonlocalized. FINDINGS: LOWER CHEST: No acute abnormality. LIVER: The liver is unremarkable. GALLBLADDER AND BILE DUCTS: Prior cholecystectomy. Stable pneumobilia. No biliary ductal dilatation. SPLEEN: No acute abnormality. PANCREAS: No acute abnormality. ADRENAL GLANDS: No acute abnormality. KIDNEYS, URETERS AND BLADDER: No stones in the kidneys or ureters. No hydronephrosis. No perinephric or periureteral stranding. Urinary bladder is unremarkable. GI AND BOWEL: Stomach demonstrates no acute abnormality. Colonic diverticulosis. No active diverticulitis. Periumbilical ventral hernia containing a knuckle of small bowel. There are several prominent small bowel loops extending into the pelvis, measuring up to 3.2 cm in size. No well-defined transition. This may reflect mild focal ileus. Normal appendix. PERITONEUM AND RETROPERITONEUM: No ascites. No free air. VASCULATURE: Aorta is normal in  caliber. LYMPH NODES: No lymphadenopathy. REPRODUCTIVE ORGANS: Pessary in place within the vagina. BONES AND SOFT TISSUES: Extensive degenerative disc or facet disease throughout the lumbar spine. No acute osseous abnormality. No focal soft tissue abnormality. IMPRESSION: 1. Several prominent small bowel loops in the abdomen and pelvis, without well-defined transition to decompressed small bowel. This may reflect mild ileus. 2. Periumbilical ventral hernia containing a knuckle of small bowel without obstruction. Electronically signed by: Franky Crease MD 04/22/2024 11:46 PM EST RP Workstation: [id number]    Procedures   Medications Ordered in the ED  famotidine  (PEPCID ) IVPB 20 mg premix (0 mg Intravenous Stopped 04/22/24 2352)  pantoprazole  (PROTONIX ) injection 40 mg (40 mg Intravenous Given 04/22/24 2214)  alum & mag hydroxide-simeth (MAALOX/MYLANTA) 200-200-20 MG/5ML suspension 30 mL (30 mLs Oral Given 04/22/24 2214)  iohexol  (OMNIPAQUE ) 300 MG/ML solution 100 mL (100 mLs Intravenous Contrast Given 04/22/24 2333)    Clinical Course as of 04/23/24 0016  Sat Apr 22, 2024  2353 83YOF, CAD, hiatal hernia. 3 days, gen abdominal pain worse after eating, onging 3 weeks but persistent pain x3 days. Nausea and vomiting this morning. Normal Bms. No recurrent nausea or vomiting. GI cocktail. [CG]  2357 PO fluid challenge, follow up on trop [CG]    Clinical Course User Index [CG] Ruthell Lonni FALCON, PA-C   Medical Decision Making Amount and/or Complexity of Data Reviewed Labs: ordered. Radiology: ordered. ECG/medicine tests: ordered.  Risk OTC drugs. Prescription drug management.  This patient is a 83 year old female who presents to the ED for concern of epigastric abdominal pain that began acutely 3 days ago and has been progressively worse, noting to about 3 episodes of emesis earlier today.  Reported that she been told that she had some nausea and vomiting with her hiatal hernia to come to  the emergency department for evaluation.  Reports that she does have some burning epigastric abdominal pain.  That is relieved with omeprazole  at home.  On physical exam, patient is in no acute distress, afebrile, alert and orient x 4, speaking in full sentences, nontachypneic, nontachycardic.  Smiling.  Notably did have some generalized abdominal tenderness that localizes to epigastric region.  LCTAB, RRR, no murmur, no lower leg edema, no rashes, no CVA tenderness, unremarkable exam otherwise.  Noted have a ventral hernia that is reducible.  Lab we does note a mild elevated white count of 12.3 and though he does have a mild increased troponin of 21.  CT scan did show dilated bowel loops without transition zone as well as stool ball in ventral hernia.  Case was discussed with attending.   Awaiting second troponin, urine culture pending, needing to tolerate p.o. challenge and likely anticipate discharge home.  Sending home with nausea medications and following up with PCP.  Patient care transferred to [facility].   Differential diagnoses prior to evaluation: The emergent differential diagnosis includes, but is not limited to,  PUD, gastritis, pancreatitis, gastroparesis, malignancy, ACS, pericarditis, intestinal ischemia, esophageal rupture, hepatitis  . This is not an exhaustive differential.   Past Medical History / Co-morbidities / Social History: HTN, CKD, GERD, hiatal hernia, CAD,  Status post cholecystectomy.  Additional history: Chart reviewed. 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  Seen by ophthalmology on 03/15/2024 for open-angle with borderline findings glaucoma bilaterally. Seen by podiatry on 10/13 for neuropathy Last seen by cardiology On 03/01/2024.  Noted to have history of syncope with no recurrence.  Continue to monitor patient today.  Lab Tests/Imaging studies: I personally  interpreted labs/imaging and the pertinent results include: CBC notes no other white count 12.3 CMP shows mild decrease GFR of 56 and slight elevated hypercalcemia.  UA does note large leukocytes and rare bacteria but otherwise unremarkable Troponin elevated 21, delta pending CT abdomen shows small bowel loops without any transition zone and shows stool ball in ventral hernia. I agree with the radiologist interpretation.  Cardiac monitoring: EKG obtained and interpreted by myself and attending physician which shows: Sinus rhythm  EKG Interpretation Date/Time:  Saturday April 22 2024 22:12:51 EST Ventricular Rate:  69 PR Interval:  164 QRS Duration:  96 QT Interval:  421 QTC Calculation: 451 R Axis:   -34  Text Interpretation: Sinus rhythm Left axis deviation Abnormal T, consider ischemia, anterior leads Confirmed by Zackowski, Scott [phone]) on 04/22/2024 10:26:57 PM          Medications: I ordered medication including Maalox, Protonix , Pepcid , Zofran .  I have reviewed the patients home medicines and have made adjustments as needed.  Critical Interventions: None  Social Determinants of Health: None  Disposition: 12:16 AM Care of Margaret [name] transferred to [facility] and Dr. Griselda at the end of my shift as the patient will require reassessment once labs/imaging have resulted. Patient presentation, ED course, and plan of care discussed with review of all pertinent labs and imaging. Please see his/her note for further details regarding further ED course and disposition. Plan at time of handoff is await labs, p.o. challenge and likely discharge with nausea control. This may be altered or completely changed at the discretion of the oncoming team pending results of further workup.    Final diagnoses:  Nausea and vomiting, unspecified vomiting type  Epigastric abdominal pain    ED Discharge Orders          Ordered    ondansetron  (ZOFRAN ) 4 MG tablet  Every 6 hours         04/23/24 0014               Margaret Terrall RAMAN, PA-C 04/23/24 0017    Griselda Norris, MD 04/23/24 [phone]

## 2024-04-22 NOTE — ED Triage Notes (Signed)
 Pt BIB [facility] from home. c/o abd pain+nausea. Pt diagnosed with hiatal hernia 6 months ago and told to come in if experiencing abd pain and nausea. SABRA Epigastric pain started earlier today.   185/110 90s RA 16RR 192CBG

## 2024-04-23 LAB — TROPONIN T, HIGH SENSITIVITY: Troponin T High Sensitivity: 21 ng/L — ABNORMAL HIGH (ref 0–19)

## 2024-04-23 MED ORDER — ONDANSETRON HCL 4 MG PO TABS: 4.0000 mg | ORAL_TABLET | Freq: Four times a day (QID) | ORAL | 0 refills | Status: AC

## 2024-04-23 MED ORDER — ONDANSETRON 4 MG PO TBDP: 4.0000 mg | ORAL_TABLET | Freq: Three times a day (TID) | ORAL | 0 refills | Status: AC | PRN

## 2024-04-23 NOTE — Discharge Instructions (Addendum)
 It was a pleasure taking part in your care.  As discussed, you have an ileus on CT imaging.  This is most likely the cause of your symptoms.  Please take Zofran  every 6 hours as needed for nausea.  Please follow-up with your doctor in the next 5 days for reevaluation.  If you develop increased abdominal pain, inability to take anything by mouth, inability to have bowel movement, fevers please return to ED.  Please read attached guide concerning ileus.

## 2024-04-23 NOTE — ED Provider Notes (Signed)
  Physical Exam  BP (!) 162/115   Pulse 90   Temp 98.3 F (36.8 C)   Resp 19   SpO2 95%   Physical Exam Vitals and nursing note reviewed.  Constitutional:      General: She is not in acute distress.    Appearance: She is not toxic-appearing.  HENT:     Head: Normocephalic and atraumatic.  Pulmonary:     Effort: No respiratory distress.  Skin:    Coloration: Skin is not jaundiced or pale.  Neurological:     Mental Status: She is alert and oriented to person, place, and time.  Psychiatric:        Behavior: Behavior normal.     Procedures  Procedures  ED Course / MDM   Clinical Course as of 04/23/24 0212  Sat Apr 22, 2024  2353 83YOF, CAD, hiatal hernia. 3 days, gen abdominal pain worse after eating, onging 3 weeks but persistent pain x3 days. Nausea and vomiting this morning. Normal Bms. No recurrent nausea or vomiting. GI cocktail. [CG]  2357 PO fluid challenge, follow up on trop [CG]    Clinical Course User Index [CG] Ruthell Lonni FALCON, PA-C   Medical Decision Making Amount and/or Complexity of Data Reviewed Labs: ordered. Radiology: ordered. ECG/medicine tests: ordered.  Risk OTC drugs. Prescription drug management.   83 year old female signed out to me at shift change pending reevaluation, delta troponin.  In short, 83 year old female presents with 3 weeks of abdominal pain worse after eating but persistent in the last 3 days.  Nausea and vomiting this morning.  Has had multiple bowel movements today.  Patient was given GI cocktail, she has had no recurrent nausea or vomiting here.  Signed out to me pending troponin as patient had ischemic EKG.  Patient noted to have ileus on CT imaging.  She has passed p.o. fluid challenge here.  She reports her abdominal pain has markedly decreased and she is pain-free at this time.  She did have troponins drawn as she had an ischemic EKG.  Chart reviewed, patient had ischemic EKGs dating back to 2019.  Her troponins are  flat at 21.  At this time, the patient is stable to discharge.  Spoke with attending Dr. Griselda prior to discharge who voices agreement with plan of management.  Patient was advised to follow-up outpatient with PCP.  She will be sent home with Zofran .  She was given very strict return precautions to include increased abdominal pain, nausea, vomiting, inability to have bowel movement and she voiced understanding.  Stable to discharge home at this time.       Ruthell Lonni FALCON, PA-C 04/23/24 9787    Griselda Norris, MD 04/23/24 [phone]

## 2024-04-24 ENCOUNTER — Ambulatory Visit (INDEPENDENT_AMBULATORY_CARE_PROVIDER_SITE_OTHER): Admitting: Podiatry

## 2024-04-24 ENCOUNTER — Encounter: Payer: Self-pay | Admitting: Podiatry

## 2024-04-24 DIAGNOSIS — M2041 Other hammer toe(s) (acquired), right foot: Secondary | ICD-10-CM

## 2024-04-24 DIAGNOSIS — G629 Polyneuropathy, unspecified: Secondary | ICD-10-CM | POA: Diagnosis not present

## 2024-04-24 DIAGNOSIS — M21619 Bunion of unspecified foot: Secondary | ICD-10-CM

## 2024-04-24 DIAGNOSIS — D2372 Other benign neoplasm of skin of left lower limb, including hip: Secondary | ICD-10-CM

## 2024-04-24 DIAGNOSIS — M2042 Other hammer toe(s) (acquired), left foot: Secondary | ICD-10-CM

## 2024-04-24 DIAGNOSIS — M2061 Acquired deformities of toe(s), unspecified, right foot: Secondary | ICD-10-CM

## 2024-04-24 LAB — URINE CULTURE: Culture: NO GROWTH

## 2024-04-24 NOTE — Progress Notes (Unsigned)
 Subjective: Chief Complaint  Patient presents with   Callouses    Bilateral foot calluses. 0 pain. Non diabetic.    83 year old female presents for follow-up evaluation of foot pain.  Patient some callus perspective these been doing well but her main concern is the toe, hammertoe as well as bunion that causes most of her discomfort.  She does not report any recent injuries or changes otherwise.    Objective: AAO x3, NAD DP/PT pulses palpable bilaterally, CRT less than 3 seconds Varicose veins are present with venous insufficiency noted. Sensation decreased with Semmes Weinstein monofilament. 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  This has been rubbing along the stump of the second toe and this is a preulcerative area noted this area.  There is a callus that is formed due to the rubbing.  There is no drainage or pus or any signs of infection noted otherwise. Minimal hyperkeratotic lesion submetatarsal bilaterally.  Upon debridement there is no underlying ulceration, drainage or signs of infection.  Uniform skin color.  No pinpoint bleeding. No pain with calf compression, swelling, warmth, erythema  Assessment: Skin lesion left foot; digital deformity; neuropathy  Plan:  Hyperkeratotic lesions - These are minimal today.  They are uniform in color.  This appears to be more of a general callus as opposed to any hyperpigmentation or discoloration.  Continue moisturizer, offloading.  Neuropathy - Continue gabapentin -will continue 4 times a day discussed not taking extra - Continue compound cream through [employer]. - Physical therapy for her back which has been helping as well  Digital contractures - This is resulting in a new hyperkeratotic lesion along the third toe. I am going to try to order different offloading pads or devices to try to help decrease the pressure.   Return in about 3 weeks (around 05/15/2024).  Margaret [name]  DPM

## 2024-04-25 ENCOUNTER — Other Ambulatory Visit: Payer: Self-pay | Admitting: Family Medicine

## 2024-04-25 ENCOUNTER — Encounter: Payer: Self-pay | Admitting: Family

## 2024-04-25 ENCOUNTER — Ambulatory Visit (INDEPENDENT_AMBULATORY_CARE_PROVIDER_SITE_OTHER): Admitting: Family

## 2024-04-25 VITALS — BP 124/84 | HR 70 | Temp 97.4°F | Resp 16 | Ht 61.75 in | Wt 153.0 lb

## 2024-04-25 DIAGNOSIS — K429 Umbilical hernia without obstruction or gangrene: Secondary | ICD-10-CM

## 2024-04-25 DIAGNOSIS — K219 Gastro-esophageal reflux disease without esophagitis: Secondary | ICD-10-CM | POA: Diagnosis not present

## 2024-04-25 DIAGNOSIS — I10 Essential (primary) hypertension: Secondary | ICD-10-CM

## 2024-04-25 MED ORDER — DIAZEPAM 2 MG PO TABS: 1.0000 mg | ORAL_TABLET | Freq: Every day | ORAL | 0 refills | Status: AC | PRN

## 2024-04-25 NOTE — Progress Notes (Unsigned)
 Acute Office Visit  Subjective:     Patient ID: Margaret [name], female    DOB: 1941-05-27, 83 y.o.   MRN: [id number]  Chief Complaint  Patient presents with  . Hospitalization Follow-up    Patient is following up from hospital visit on 04/22/2024.    HPI Patient is in today for for hospital follow-up from 04/22/2024.  Patient presented with symptoms of nausea, vomiting and abdominal pain.  She was given a GI cocktail in the ED that was very helpful and resolved her pain.  She was also advised to take Colace daily for which she has been and is having about 2 stools a day.  She feels much better today.  She has been watching her diet by eating bland things and avoiding fried foods and spicy foods.  This is helped significantly.  Also has concerns about flying and having an upcoming procedure.  Reports Dr. Copland has given her medication in the past to help cope with the anxiety of the procedure.  She would like a refill.  Review of Systems  Gastrointestinal:  Negative for abdominal pain, heartburn, nausea and vomiting.  All other systems reviewed and are negative.   Past Medical History:  Diagnosis Date  . Anxiety   . Arthritis   . Cancer (HCC)    lt. foot melanoma  . Chronic kidney disease    lt. kidney cyst  . Coronary artery disease   . GERD (gastroesophageal reflux disease)   . History of healed stress fracture 2013   bilateral feet  . Hx of phlebitis    reports remote hx of superfical blood clots never anticoagulated  . Hypertension   . Osteopenia   . Pneumonia     Social History   Socioeconomic History  . Marital status: Widowed    Spouse name: Not on file  . Number of children: 3  . Years of education: Not on file  . Highest education level: 12th grade  Occupational History  . Not on file  Tobacco Use  . Smoking status: Former    Current packs/day: 0.00    Average packs/day: 0.3 packs/day for 15.0 years (3.8 ttl pk-yrs)    Types: Cigarettes     Start date: 06/01/1964    Quit date: 06/02/1979    Years since quitting: 44.9  . Smokeless tobacco: Never  Vaping Use  . Vaping status: Never Used  Substance and Sexual Activity  . Alcohol use: Yes    Alcohol/week: 1.0 standard drink of alcohol    Types: 1 Glasses of wine per week    Comment: Occ  . Drug use: No  . Sexual activity: Not Currently  Other Topics Concern  . 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 Difficulty of Paying Living Expenses: Not hard at all  Food Insecurity: No Food Insecurity (02/13/2024)   Hunger Vital Sign   . Worried About Programme Researcher, Broadcasting/film/video in the Last Year: Never true   . Ran Out of Food in the Last Year: Never true  Transportation Needs: No Transportation  Needs (02/13/2024)   PRAPARE - Transportation   . Lack of Transportation (Medical): No   . Lack of Transportation (Non-Medical): No  Physical Activity: Insufficiently Active (02/13/2024)   Exercise Vital Sign   . Days of Exercise per Week: 2 days   . Minutes of Exercise per Session: 40 min  Stress: No Stress Concern Present (02/13/2024)   [employer] of Occupational Health - Occupational Stress Questionnaire   . Feeling of Stress: Only a little  Social Connections: Moderately Integrated (02/13/2024)   Social Connection and Isolation Panel   . Frequency of Communication with Friends and Family: More than three times a week   . Frequency of Social Gatherings with Friends and Family: More than three times a week   . Attends Religious Services: More than 4 times per year   . Active Member of Clubs or Organizations: Yes   . Attends Banker  Meetings: More than 4 times per year   . Marital Status: Widowed  Intimate Partner Violence: Not At Risk (06/03/2022)   Humiliation, Afraid, Rape, and Kick questionnaire   . Fear of Current or Ex-Partner: No   . Emotionally Abused: No   . Physically Abused: No   . Sexually Abused: No    Past Surgical History:  Procedure Laterality Date  . CATARACT EXTRACTION W/ INTRAOCULAR LENS IMPLANT Bilateral   . CHOLECYSTECTOMY N/A 09/19/2016   Procedure: LAPAROSCOPIC CHOLECYSTECTOMY POSSIBLE INTRAOPERATIVE CHOLANGIOGRAM;  Surgeon: Donnice Bury, MD;  Location: [facility] OR;  Service: General;  Laterality: N/A;  . ERCP N/A 09/18/2016   Procedure: ENDOSCOPIC RETROGRADE CHOLANGIOPANCREATOGRAPHY (ERCP);  Surgeon: Victory LITTIE Legrand DOUGLAS, MD;  Location: [facility] ENDOSCOPY;  Service: Endoscopy;  Laterality: N/A;  . FOOT SURGERY     patient reports four foot surgeries  . FOOT SURGERY Left    bunion, hammer toe surgery  . FOOT SURGERY Right    shaved bunion, hammer toe correction  . HERNIA REPAIR    . KNEE ARTHROSCOPY Right 06/02/1995  . REPLACEMENT TOTAL KNEE Left   . REPLACEMENT TOTAL KNEE Left 10/30/2012  . RETINAL DETACHMENT SURGERY    . RETINAL DETACHMENT SURGERY Left 06/01/2006  . ROBOTIC ASSITED PARTIAL NEPHRECTOMY Left 06/03/2022   Procedure: XI ROBOTIC ASSITED PARTIAL NEPHRECTOMY WITH ULTRASOUND AND VENTRAL HERNIA REPAIR;  Surgeon: Alvaro Hummer, MD;  Location: [facility] ORS;  Service: Urology;  Laterality: Left;  3 HRS  . THYROIDECTOMY, PARTIAL Right   . THYROIDECTOMY, PARTIAL  06/02/2003  . TOTAL KNEE ARTHROPLASTY Right 11/13/2019   Procedure: TOTAL KNEE ARTHROPLASTY SDDC;  Surgeon: Melodi Lerner, MD;  Location: [facility] ORS;  Service: Orthopedics;  Laterality: Right;     Family History  Problem Relation Age of Onset  . Heart disease Mother   . Heart attack Mother   . Heart disease Father   . Diabetes Maternal Grandmother   . Cancer Paternal Grandfather        stomach    Allergies  Allergen Reactions   . Keflex  [Cephalexin ] Diarrhea  . Percocet [Oxycodone -Acetaminophen ] Other (See Comments)    Syncope (passed out)  . Clindamycin /Lincomycin Rash    Current Outpatient Medications on File Prior to Visit  Medication Sig Dispense Refill  . acetaminophen  (TYLENOL ) 500 MG tablet 1000 mg by oral route.    . diclofenac Sodium (VOLTAREN) 1 % GEL Apply 2 g topically 4 (four) times daily as needed (pain).    . gabapentin  (NEURONTIN ) 300 MG capsule Take 1 capsule (300 mg total) by mouth 4 (four) times daily. 360  capsule 0  . HYDROcodone -acetaminophen  (NORCO) 7.5-325 MG tablet Take 1 tablet by mouth every 8 (eight) hours as needed for moderate pain (pain score 4-6). 45 tablet 0  . metoprolol  succinate (TOPROL -XL) 25 MG 24 hr tablet TAKE 1 TABLET BY MOUTH DAILY .TAKE WITH OR IMMEDIATELY FOLLOWING A MEAL 90 tablet 1  . NONFORMULARY OR COMPOUNDED ITEM [facility]:  Peripheral Neuropathy - Bupivacaine  1%, Doxepin 3%, Gabapentin  6%, Pentoxifylline 3%, Topiramate 1%. Apply 1-2 grams to affected area 3-4 times daily. 60 each 3  . omeprazole  (PRILOSEC) 20 MG capsule Take 1 capsule (20 mg total) by mouth daily. 90 capsule 1  . ondansetron  (ZOFRAN ) 4 MG tablet Take 1 tablet (4 mg total) by mouth every 6 (six) hours. 12 tablet 0  . ondansetron  (ZOFRAN -ODT) 4 MG disintegrating tablet Take 1 tablet (4 mg total) by mouth every 8 (eight) hours as needed for nausea or vomiting. 20 tablet 0   No current facility-administered medications on file prior to visit.    BP 124/84 (BP Location: Left Arm, Patient Position: Sitting, Cuff Size: Large)   Pulse 70   Temp (!) 97.4 F (36.3 C) (Oral)   Resp 16   Ht 5' 1.75 (1.568 m)   Wt 153 lb (69.4 kg)   SpO2 100%   BMI 28.21 kg/m chart     Objective:    BP 124/84 (BP Location: Left Arm, Patient Position: Sitting, Cuff Size: Large)   Pulse 70   Temp (!) 97.4 F (36.3 C) (Oral)   Resp 16   Ht 5' 1.75 (1.568 m)   Wt 153 lb (69.4 kg)   SpO2 100%   BMI 28.21  kg/m    Physical Exam Vitals reviewed.  Constitutional:      Appearance: Normal appearance.  Cardiovascular:     Rate and Rhythm: Normal rate and regular rhythm.     Pulses: Normal pulses.     Heart sounds: Normal heart sounds.  Pulmonary:     Effort: Pulmonary effort is normal.     Breath sounds: Normal breath sounds.  Abdominal:     General: Bowel sounds are normal.     Palpations: Abdomen is soft.     Tenderness: There is no abdominal tenderness. There is no guarding or rebound.  Musculoskeletal:        General: Normal range of motion.     Cervical back: Normal range of motion and neck supple.  Skin:    General: Skin is warm and dry.  Neurological:     General: No focal deficit present.     Mental Status: She is alert and oriented to person, place, and time. Mental status is at baseline.  Psychiatric:        Mood and Affect: Mood normal.        Behavior: Behavior normal.        Thought Content: Thought content normal.    No results found for any visits on 04/25/24.      Assessment & Plan:   Problem List Items Addressed This Visit     GERD (gastroesophageal reflux disease) - Primary   Other Visit Diagnoses       Umbilical hernia without obstruction or gangrene           Meds ordered this encounter  Medications  . diazepam  (VALIUM ) 2 MG tablet    Sig: Take 0.5-1 tablets (1-2 mg total) by mouth daily as needed for anxiety ( prior to the procedure).    Dispense:  5  tablet    Refill:  0    Supervising Provider:   DOMENICA BLACKBIRD A [[id number]]   Continue a GERD friendly diet.  Valium  as needed 30 minutes prior to the procedure for flight.  Call the office with any questions or concerns.  Recheck with PCP in 4 months and sooner as needed. Return in about 4 months (around 08/23/2024).  Maikayla Beggs B Delania Ferg, FNP

## 2024-05-01 ENCOUNTER — Encounter: Payer: Self-pay | Admitting: Family Medicine

## 2024-05-01 ENCOUNTER — Telehealth: Payer: Self-pay

## 2024-05-01 ENCOUNTER — Other Ambulatory Visit

## 2024-05-01 ENCOUNTER — Ambulatory Visit (HOSPITAL_BASED_OUTPATIENT_CLINIC_OR_DEPARTMENT_OTHER)
Admission: RE | Admit: 2024-05-01 | Discharge: 2024-05-01 | Disposition: A | Source: Ambulatory Visit | Attending: Family Medicine | Admitting: Family Medicine

## 2024-05-01 DIAGNOSIS — R300 Dysuria: Secondary | ICD-10-CM

## 2024-05-01 DIAGNOSIS — K5900 Constipation, unspecified: Secondary | ICD-10-CM | POA: Insufficient documentation

## 2024-05-01 DIAGNOSIS — N390 Urinary tract infection, site not specified: Secondary | ICD-10-CM | POA: Diagnosis not present

## 2024-05-01 MED ORDER — AMOXICILLIN 500 MG PO CAPS: 500.0000 mg | ORAL_CAPSULE | Freq: Two times a day (BID) | ORAL | 0 refills | Status: AC

## 2024-05-01 NOTE — Addendum Note (Signed)
 Addended by: WATT RAISIN C on: 05/01/2024 02:23 PM   Modules accepted: Orders

## 2024-05-01 NOTE — Telephone Encounter (Signed)
 Called her back- she did have a BM early this am  She is no longer using zofran  Asked her to pause further laxatives for the next couple of days and see how she does.  She will keep me posted She is concerned about UTI sx but was not able to give a sample as of yet today.  She will collect at home and bring in tomorrow- once collected can start on amox which I sent to her pharmacy

## 2024-05-01 NOTE — Addendum Note (Signed)
 Addended by: ESTELLE GILLIS D on: 05/01/2024 04:23 PM   Modules accepted: Orders

## 2024-05-01 NOTE — Telephone Encounter (Signed)
 Initial Comment Caller states her mom was in the ED on 11/22 and was dx with an Ileus and she has not had a bowel movement since last Wednesday and last night she had an almost fainting experience. She was able to get her in the bed and lift her legs so she did not faint but she was having pains in her stomach. This morning she is feeling better but she needs to be able to have a bowel movement. She has been taking Colace and MiraLAX  but still has not gone. Translation No Nurse Assessment Nurse: Shirleen, RN, Callie Date/Time (Eastern Time): 04/30/2024 11:51:49 AM Confirm and document reason for call. If symptomatic, describe symptoms. ---Caller states her mother was seen in ED on 11/22 for ileus. She has not had a BM since 11/26 with Colace and MiraLAX . She has an episode of dizziness and abdominal pain last night but she has not today. Does the patient have any new or worsening symptoms? ---Yes Will a triage be completed? ---Yes Related visit to physician within the last 2 weeks? ---Yes Does the PT have any chronic conditions? (i.e. diabetes, asthma, this includes High risk factors for pregnancy, etc.) ---Yes List chronic conditions. ---HTN Is this a behavioral health or substance abuse call? ---No PLEASE NOTE: All timestamps contained within this report are represented as Eastern Standard Time. CONFIDENTIALTY NOTICE: This fax transmission is intended only for the addressee. It contains information that is legally privileged, confidential or otherwise protected from use or disclosure. If you are not the intended recipient, you are strictly prohibited from reviewing, disclosing, copying using or disseminating any of this information or taking any action in reliance on or regarding this information. If you have received this fax in error, please notify us  immediately by telephone so that we can arrange for its return to us . Phone: [phone], Toll-Free: [phone], Fax:  [phone] Margaret [name] 09-07-1940 Page: 1 of2 CallId: [id number] Guidelines Guideline Title Affirmed Question Affirmed Notes Nurse Date/Time Titus Time) Constipation Unable to have a bowel movement (BM) without laxative or enema Shirleen, RN, Callie 04/30/2024 11:53:18 AM Disp. Time Titus Time) Disposition Final User 04/30/2024 11:36:11 AM Attempt made - no message left Shirleen, RN, [facility] 04/30/2024 11:57:12 AM See PCP within 24 Hours Yes Shirleen, RN, Callie Final Disposition 04/30/2024 11:57:12 AM See PCP within 24 Hours Yes [place], RN, [employer] Disposition Overriden: SEE PCP WITHIN 3 DAYS Override Reason: Patient's symptoms need a higher level of care Caller Disagree/Comply Comply Caller Understands Yes PreDisposition Did not know what to do Care Advice Given Per Guideline SEE PCP WITHIN 24 HOURS: * IF OFFICE WILL BE CLOSED: You need to be seen within the next 24 hours. A clinic or an urgent care center is often a good source of care if your doctor's office is closed or you can't get an appointment. STEP 1 - USE AN OSMOTIC LAXATIVE: * You can take polyethylene glycol 3350  ( MIRALAX , PEG 3350  in the [place] PEG 3350  or RESTORALAX in [place]) or magnesium  hydroxide ( STEP 2 - USE A STIMULANT LAXATIVE - SUPPOSITORY: * If the constipation does not get better with the Care Advice in Step 1, add a stimulant laxative. * Use either BISACODYL  (Dulcolax) or a GLYCERIN SUPPOSITORY. * Before using any medicine, read all the instructions on the package. CALL BACK IF: * Severe or increasing abdomen pain * Constant abdomen pain lasting over 2 hours * Vomiting occurs * You become worse Comments User: Aline Shirleen, RN Date/Time Titus Time): 04/30/2024 12:00:54 PM Upgraded call  as the patient had a recently ileus. Pt states she is passing gas and she is not having any pain or vomiting today. Referrals REFERRED TO PCP OFFICE

## 2024-05-01 NOTE — Addendum Note (Signed)
 Addended by: WATT RAISIN C on: 05/01/2024 05:03 PM   Modules accepted: Orders

## 2024-05-01 NOTE — Telephone Encounter (Signed)
 Received abd films which are reassuring Urine culture is pending  Called and LMOM for pt and her daughter.  X-ray is reassuring.  Can they please give me more info about possible UTI sx   ER visit 11/22- ileus seen on CT   DG Abd 2 Views Result Date: 05/01/2024 CLINICAL DATA:  Constipation, UTI. EXAM: ABDOMEN - 2 VIEW COMPARISON:  09/16/2016. FINDINGS: Stool is seen in the ascending, descending and rectosigmoid colon. No small bowel dilatation. Surgical clips in the right upper quadrant. Extensive degenerative changes in the spine, at the symphysis pubis and within the hip joints bilaterally. IMPRESSION: Moderate stool burden. Electronically Signed   By: Newell Eke M.D.   On: 05/01/2024 16:03

## 2024-05-02 ENCOUNTER — Other Ambulatory Visit

## 2024-05-02 DIAGNOSIS — R300 Dysuria: Secondary | ICD-10-CM | POA: Diagnosis not present

## 2024-05-03 LAB — URINE CULTURE
MICRO NUMBER:: 17302457
Result:: NO GROWTH
SPECIMEN QUALITY:: ADEQUATE

## 2024-05-04 ENCOUNTER — Ambulatory Visit: Admitting: Physical Therapy

## 2024-05-04 ENCOUNTER — Encounter: Payer: Self-pay | Admitting: Family Medicine

## 2024-05-05 ENCOUNTER — Ambulatory Visit: Admitting: Physical Therapy

## 2024-05-09 DIAGNOSIS — M5416 Radiculopathy, lumbar region: Secondary | ICD-10-CM | POA: Diagnosis not present

## 2024-05-15 ENCOUNTER — Ambulatory Visit

## 2024-05-15 ENCOUNTER — Ambulatory Visit: Admitting: Podiatry

## 2024-05-15 DIAGNOSIS — M7751 Other enthesopathy of right foot: Secondary | ICD-10-CM | POA: Diagnosis not present

## 2024-05-15 NOTE — Progress Notes (Signed)
 Subjective: Chief Complaint  Patient presents with   Peripheral Neuropathy    Pt stated that she is doing about the same      83 year old female presents for follow-up evaluation of foot pain.  She states that most of her pain is along the area the right bunion.  She states that she has a wedding next week that she has been to wear shoe for [place] stretching her shoe as well.  She does not report any recent injuries. No open lesions.    Objective: AAO x3, NAD DP/PT pulses palpable bilaterally, CRT less than 3 seconds Varicose veins are present with venous insufficiency noted. Sensation decreased with Semmes Weinstein monofilament. 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  Bunion deformity is noted with some mild callus formation which is new on the area of the bunion on the right foot.  Tenderness palpation directly along the area of the prominence.  There is no skin breakdown.  There is no erythema or warmth.  Minimal callus formation submetatarsal bilateral without any underlying ulceration or drainage or sign of infection.  There is no hyperpigmentation or pigment changes.  No pain with calf compression, swelling, warmth, erythema  Assessment: Bunion, capsulitis right foot; hyperkeratotic lesions  Plan:  Hyperkeratotic lesions - These are minimal today.  They are uniform in color.  This appears to be more of a general callus as opposed to any hyperpigmentation or discoloration.  Continue moisturizer, offloading.  Neuropathy - Continue gabapentin -will continue 4 times a day discussed not taking extra - Continue compound cream through [employer]. - Physical therapy for her back which has been helping as well  Bunion deformity - She wishes to proceed with a steroid injection.  Cleaned the skin with Betadine , alcohol.  Mixture 1 cc dexamethasone  phosphate, 1 cc Marcaine  plain was infiltrated into around the first MTPJ without  complications.  Postinjection care discussed.  Tolerated well.  Dispensed offloading pads.  I did stretch her shoes for the area of the bunion for the wedding.  Discussed topical medication that she can use.  Dispensed offloading pads.   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  Significant bunion is present with hardware intact from prior surgery.  No evidence of acute fracture.  Donnice JONELLE Fees DPM

## 2024-05-15 NOTE — Patient Instructions (Signed)

## 2024-05-16 ENCOUNTER — Other Ambulatory Visit: Payer: Self-pay | Admitting: Podiatry

## 2024-05-29 ENCOUNTER — Ambulatory Visit: Admitting: Obstetrics and Gynecology

## 2024-05-30 ENCOUNTER — Other Ambulatory Visit: Payer: Self-pay | Admitting: Family Medicine

## 2024-05-30 DIAGNOSIS — G8929 Other chronic pain: Secondary | ICD-10-CM

## 2024-05-30 DIAGNOSIS — M25551 Pain in right hip: Secondary | ICD-10-CM

## 2024-05-30 DIAGNOSIS — M5440 Lumbago with sciatica, unspecified side: Secondary | ICD-10-CM

## 2024-05-30 DIAGNOSIS — M159 Polyosteoarthritis, unspecified: Secondary | ICD-10-CM

## 2024-05-30 MED ORDER — HYDROCODONE-ACETAMINOPHEN 7.5-325 MG PO TABS: 1.0000 | ORAL_TABLET | Freq: Three times a day (TID) | ORAL | 0 refills | Status: DC | PRN

## 2024-05-30 NOTE — Addendum Note (Signed)
 Addended by: WATT RAISIN C on: 05/30/2024 01:42 PM   Modules accepted: Orders

## 2024-05-30 NOTE — Telephone Encounter (Signed)
 Copied from CRM [id number]. Topic: Clinical - Medication Refill >> May 30, 2024  9:39 AM China J wrote: Medication: HYDROcodone -acetaminophen  (NORCO) 7.5-325 MG tablet  Has the patient contacted their pharmacy? Yes (Agent: If no, request that the patient contact the pharmacy for the refill. If patient does not wish to contact the pharmacy document the reason why and proceed with request.) (Agent: If yes, when and what did the pharmacy advise?)  This is the patient's preferred pharmacy:  [facility] [id number] [place], [place] - [address] [place] [address] Phone: [phone] Fax: [phone]  Is this the correct pharmacy for this prescription? Yes If no, delete pharmacy and type the correct one.   Has the prescription been filled recently? No  Is the patient out of the medication? Yes  Has the patient been seen for an appointment in the last year OR does the patient have an upcoming appointment? Yes  Can we respond through MyChart? Yes  Agent: Please be advised that Rx refills may take up to 3 business days. We ask that you follow-up with your pharmacy.

## 2024-06-05 ENCOUNTER — Ambulatory Visit: Admitting: Podiatry

## 2024-06-05 ENCOUNTER — Encounter: Payer: Self-pay | Admitting: Podiatry

## 2024-06-05 DIAGNOSIS — D2372 Other benign neoplasm of skin of left lower limb, including hip: Secondary | ICD-10-CM

## 2024-06-05 DIAGNOSIS — M21619 Bunion of unspecified foot: Secondary | ICD-10-CM | POA: Diagnosis not present

## 2024-06-05 DIAGNOSIS — M2041 Other hammer toe(s) (acquired), right foot: Secondary | ICD-10-CM

## 2024-06-05 DIAGNOSIS — M7751 Other enthesopathy of right foot: Secondary | ICD-10-CM

## 2024-06-05 DIAGNOSIS — M2042 Other hammer toe(s) (acquired), left foot: Secondary | ICD-10-CM | POA: Diagnosis not present

## 2024-06-05 NOTE — Progress Notes (Unsigned)
 Subjective:  Chief Complaint  Patient presents with   Peripheral Neuropathy    Rm11 Neuropathy follow up     84 year old female presents for follow-up evaluation of foot pain.  She states that she recently went out of town to a wedding and her feet were hurting most on the right bunion area where she gets a callus, irritation.  She tried stretching and shoes as well as offloading pads a lot helped minimally she continues to get discomfort.  She also concerned about the left fourth toe however curls and causing discomfort.  She is asked about cutting the tendon on the toe to help decrease the pressure.  No open lesions.      Objective: AAO x3, NAD DP/PT pulses palpable bilaterally, CRT less than 3 seconds Varicose veins are present with venous insufficiency noted. Sensation decreased with Semmes Weinstein monofilament. 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  Bunion deformity is noted with some increased callus formation on the right foot.  There is prominence of the bone probable.  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  Tenderness palpation directly along the area of the prominence.  There is no skin breakdown.  There is no erythema or warmth.  Minimal callus formation submetatarsal bilateral without any underlying ulceration or drainage or sign of infection.  There is no hyperpigmentation or pigment changes.  No pain with calf compression, swelling, warmth, erythema  Assessment: Bunion, capsulitis right foot; hyperkeratotic lesions  Plan:  Hyperkeratotic lesions - These are minimal today.  They are uniform in color.  This appears to be more of a general callus as opposed to any hyperpigmentation or discoloration.  Continue moisturizer, offloading.  Neuropathy - Continue gabapentin -will continue 4 times a day discussed not taking extra - Continue compound cream through [facility].  Bunion deformity/Hammertoe  -We discussed with conservative as well as surgical options.  The bone is palpable and I think that since she has tried shoe modification from offloading padding we discussed the ostectomy (Silver bunionectomy) of th 1st metatarsal.  Discussed possible flexor tendon of the fourth toe as well.  We discussed the surgery as well as postoperative course.  She wishes to proceed we will tentatively plan on February 18.  Will see her back in a couple weeks to address foot issues in general and for consent form.  Return in about 3 weeks (around 06/26/2024).  Margaret [name] DPM

## 2024-06-12 ENCOUNTER — Telehealth: Payer: Self-pay | Admitting: Family Medicine

## 2024-06-12 NOTE — Telephone Encounter (Signed)
 Copied from CRM #[id number]. Topic: Medicare AWV >> Jun 12, 2024  9:20 AM Nathanel DEL wrote: Called LVM 06/12/2024 to sched AWVS. Please schedule AWVS in office.  Nathanel Paschal; Care Guide Ambulatory Clinical Support [facility] Direct Dial: [phone]

## 2024-06-14 ENCOUNTER — Ambulatory Visit: Attending: Physical Medicine and Rehabilitation | Admitting: Physical Therapy

## 2024-06-14 ENCOUNTER — Encounter: Payer: Self-pay | Admitting: Physical Therapy

## 2024-06-14 ENCOUNTER — Other Ambulatory Visit: Payer: Self-pay

## 2024-06-14 DIAGNOSIS — M5441 Lumbago with sciatica, right side: Secondary | ICD-10-CM | POA: Insufficient documentation

## 2024-06-14 DIAGNOSIS — R293 Abnormal posture: Secondary | ICD-10-CM | POA: Diagnosis present

## 2024-06-14 DIAGNOSIS — G8929 Other chronic pain: Secondary | ICD-10-CM | POA: Insufficient documentation

## 2024-06-14 DIAGNOSIS — M5459 Other low back pain: Secondary | ICD-10-CM | POA: Insufficient documentation

## 2024-06-14 DIAGNOSIS — R262 Difficulty in walking, not elsewhere classified: Secondary | ICD-10-CM | POA: Insufficient documentation

## 2024-06-14 DIAGNOSIS — M5442 Lumbago with sciatica, left side: Secondary | ICD-10-CM | POA: Insufficient documentation

## 2024-06-14 NOTE — Therapy (Addendum)
 " OUTPATIENT PHYSICAL THERAPY LOWER EXTREMITY EVALUATION   Patient Name: Margaret [name] MRN: [id number] DOB:1940-08-20, 84 y.o., female Today's Date: 06/14/2024  END OF SESSION:  PT End of Session - 06/14/24 1103     Visit Number 1    Date for Recertification  09/12/24    Authorization Type HTA    PT Start Time 1101    PT Stop Time 1145    PT Time Calculation (min) 44 min    Activity Tolerance Patient tolerated treatment well    Behavior During Therapy WFL for tasks assessed/performed           Past Medical History:  Diagnosis Date   Anxiety    Arthritis    Cancer (HCC)    lt. foot melanoma   Chronic kidney disease    lt. kidney cyst   Coronary artery disease    GERD (gastroesophageal reflux disease)    History of healed stress fracture 2013   bilateral feet   Hx of phlebitis    reports remote hx of superfical blood clots never anticoagulated   Hypertension    Osteopenia    Pneumonia    Past Surgical History:  Procedure Laterality Date   CATARACT EXTRACTION W/ INTRAOCULAR LENS IMPLANT Bilateral    CHOLECYSTECTOMY N/A 09/19/2016   Procedure: LAPAROSCOPIC CHOLECYSTECTOMY POSSIBLE INTRAOPERATIVE CHOLANGIOGRAM;  Surgeon: Donnice Bury, MD;  Location: [facility];  Service: General;  Laterality: N/A;   ERCP N/A 09/18/2016   Procedure: ENDOSCOPIC RETROGRADE CHOLANGIOPANCREATOGRAPHY (ERCP);  Surgeon: Victory LITTIE Legrand DOUGLAS, MD;  Location: [facility] ENDOSCOPY;  Service: Endoscopy;  Laterality: N/A;   FOOT SURGERY     patient reports four foot surgeries   FOOT SURGERY Left    bunion, hammer toe surgery   FOOT SURGERY Right    shaved bunion, hammer toe correction   HERNIA REPAIR     KNEE ARTHROSCOPY Right 06/02/1995   REPLACEMENT TOTAL KNEE Left    REPLACEMENT TOTAL KNEE Left 10/30/2012   RETINAL DETACHMENT SURGERY     RETINAL DETACHMENT SURGERY Left 06/01/2006   ROBOTIC ASSITED PARTIAL NEPHRECTOMY Left 06/03/2022   Procedure: XI ROBOTIC ASSITED PARTIAL NEPHRECTOMY WITH  ULTRASOUND AND VENTRAL HERNIA REPAIR;  Surgeon: Alvaro Hummer, MD;  Location: [facility] ORS;  Service: Urology;  Laterality: Left;  3 HRS   THYROIDECTOMY, PARTIAL Right    THYROIDECTOMY, PARTIAL  06/02/2003   TOTAL KNEE ARTHROPLASTY Right 11/13/2019   Procedure: TOTAL KNEE ARTHROPLASTY SDDC;  Surgeon: Melodi Lerner, MD;  Location: [facility] ORS;  Service: Orthopedics;  Laterality: Right;    Patient Active Problem List   Diagnosis Date Noted   Renal mass 06/03/2022   Open wound of left side of back 01/20/2021   Lipoma of back 11/18/2020   Lymphedema 11/18/2020   Melanocytic nevi of right upper limb, including shoulder 11/18/2020   Personal history of malignant melanoma of skin 11/18/2020   Sebaceous cyst 11/18/2020   Seborrheic dermatitis 11/18/2020   Pre-diabetes 04/03/2020   OA (osteoarthritis) of knee 11/13/2019   Total knee replacement status, 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   Pain in right knee 08/03/2019   Fluid collection at surgical site 10/20/2018   Encounter for postoperative examination after surgery for malignant neoplasm 09/30/2018   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/2018   Plantar fasciitis of right foot 02/03/2017   Porokeratosis 02/03/2017   Acute gallstone pancreatitis    Calculus of gallbladder with acute cholecystitis and obstruction    LFT elevation    Stress fracture 01/01/2016   Insomnia 12/16/2015  Peripheral neuropathy 10/18/2015   Renal insufficiency 03/06/2015   HTN (hypertension) 03/06/2015   Osteopenia 03/06/2015   GERD (gastroesophageal reflux disease) 03/06/2015   Vaginal pessary present 03/06/2015   Metatarsalgia 07/13/2012   Increased frequency of urination 04/13/2012   DDD (degenerative disc disease), cervical 01/19/2012   Knee stiffness 01/19/2012   Low back pain 01/19/2012   Neck pain 01/19/2012   Pain of hand 01/19/2012   Hallux valgus 09/20/2011   Hyperkeratosis 08/13/2011   Anxiety disorder 07/06/2011   Depression 07/06/2011   Diverticulosis  of large intestine 07/06/2011   Non-toxic multinodular goiter 07/06/2011   Atrophic vaginitis 10/24/2010   Dermatographic urticaria 09/17/2010   Inflamed seborrheic keratosis 09/17/2010   Closed fracture of fifth metatarsal bone 07/10/2010   Diagnosis unknown 02/27/2010   Hip pain 06/08/2008   Pulmonary nodule seen on imaging study 06/08/2008    PCP: Watt Raisin, MD  REFERRING PROVIDER: Charlie Dolores, MD, [facility]  REFERRING DIAG: degeneration of discs of lumbar spine  THERAPY DIAG:  Chronic bilateral low back pain with right-sided sciatica  Difficulty in walking, not elsewhere classified  Posture imbalance  Other low back pain  Rationale for Evaluation and Treatment: Rehabilitation  ONSET DATE: chronic  SUBJECTIVE:   SUBJECTIVE STATEMENT: We saw [name] last year with good progress in her overall function, she however had an ED visit 04/22/24 and 04/23/24.   Reports my back is killing me.  She has a walking boot on now on the right and reports that this is causing more pain.  Will have procedure on the foot 07/19/24.  I am really having more difficulty getting up and down especially when out to eat  PERTINENT HISTORY: Saw orthopedist, took prednisone, which didn't help, orthopedist may want to do MRI, referred to PT by both PCP and orthopedist, also B degenerative arthritis hips and back PAIN:  Are you having pain? Yes: NPRS scale: 4/10 Pain location: central lower back Lat b hips Pain description: deep ache Aggravating factors: worse after prolonged immobility, sitting, sleeping, standing, worse in the morning shopping pain up to 8/10 Relieving factors: gapbapentin is helping, pain med sin am helping, physical therapy has helped in the past some time pain can be 0/10  PRECAUTIONS: Fall  RED FLAGS: None   WEIGHT BEARING RESTRICTIONS: No  FALLS:  Has patient fallen in last 6 months? Yes. Number of falls reports that she has been very careful  LIVING  ENVIRONMENT: Lives with: lives with their family and lives with their daughter Lives in: House/apartment Stairs: Yes: Internal: one flight steps; on left going up Has following equipment at home: Walker - 4 wheeled in the morning, once she gets going I sometimes don't use anything  OCCUPATION: retired  PLOF: Independent and Independent with basic ADLs reports that she does some cooking and cleaning, can't stand much more than 20 minutes  PATIENT GOALS: reduce pain hips and lower back so I can tolerate more activity, get up and down easier  NEXT MD VISIT: not scheduled yet  OBJECTIVE:  Note: Objective measures were completed at Evaluation unless otherwise noted.  DIAGNOSTIC FINDINGS: X-ray done 11/23; IMPRESSION: 1. 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 2. Lower lumbar spondylosis. 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  - Multilevel lumbar degenerative disc disease   PATIENT SURVEYS:  ODI:  42%  COGNITION: Overall cognitive status: Within functional limits for tasks assessed     SENSATION: Light touch: WFL  EDEMA:  None noted   MUSCLE LENGTH: Hamstrings: Right wfl deg; Left wfl deg Debby test: Right wfl deg; Left wfl deg  POSTURE: scoliotic posture noted concavity L lumbar, pronounced thoracic kyphosis  PALPATION: Not tender with light palpation B gr trochanters hips, feels tight in the lumbar mms  LOWER EXTREMITY ROM:B hip ROM wnl LUMBAR ROM: flexion decreased 50%, extension and side bending decreased 75% with pain   LOWER EXTREMITY MMT:  MMT Right eval Left eval  Hip flexion 4- 4-  Hip extension    Hip abduction 3+ 3+  Hip adduction 4- 4-  Hip internal rotation    Hip external rotation    Knee flexion 4- 4-  Knee extension 4- 4-  Ankle dorsiflexion    Ankle plantarflexion    Ankle inversion    Ankle eversion     (Blank rows = not tested)   FUNCTIONAL TESTS:  Timed up and go (TUG): 20  seconds no device, has to use hands to get up from sitting 2 minute walk test: 200 feet with supervision, the more she walked the more stooped she became, with SOB Berg: 33/56   GAIT: Distance walked: in clinic 200 feet, no device but with supervision, had walking boot on the right due to foot issue, she became short of breath, and the more she walked the more stooped she became and reported increased LBP with the walk                                                                                                                                TREATMENT DATE: 06/14/24 Evaluation   Evaluation, explained to pt that long term management of arthritic changes in hips in spine, should respond well to moderate exercise which she did last year and reports that she feels she just needs to get back to what we were doing.   she is a high fall risk. Nustep level 4 x 6 minutes  PATIENT EDUCATION:  Education details: POC, goals Person educated: Patient Education method: Explanation, Demonstration, Tactile cues, and Verbal cues Education comprehension: verbalized understanding and returned demonstration  HOME EXERCISE PROGRAM: Will need to add HEP.   ASSESSMENT:  CLINICAL IMPRESSION: Patient is a 84 y.o. female who was evaluated today by physical therapy due to chronic central lower back pain as well as B hip pain.  She had experienced several falls last year but we saw her last fall and she reports no falls in the past 6 months, she has had some other health issues that she reports has caused her to have more difficulty walking and getting up from sitting especially when out to eat.  When had not done the BERG with her before and she scored 33/56 which puts her at a high risk for falls. Recommended standard front wheel walker for community outings.  Presents with postural changes in lumbar spine and gross B LE weakness .  Will benefit from skilled physical  therapy to address her deficits and improve her  pain, safety, functional mobility. OBJECTIVE IMPAIRMENTS: decreased activity tolerance, decreased balance, decreased knowledge of condition, decreased knowledge of use of DME, decreased mobility, difficulty walking, decreased ROM, decreased strength, decreased safety awareness, impaired perceived functional ability, postural dysfunction, and pain.   ACTIVITY LIMITATIONS: carrying, lifting, bending, standing, squatting, transfers, bed mobility, and locomotion level  PARTICIPATION LIMITATIONS: meal prep, cleaning, laundry, shopping, community activity, and yard work  PERSONAL FACTORS: Age, Behavior pattern, Fitness, Past/current experiences, Time since onset of injury/illness/exacerbation, and 1-2 comorbidities: severe DJD B hips, lumbar region, neuropathy are also affecting patient's functional outcome.   REHAB POTENTIAL: Good  CLINICAL DECISION MAKING: Evolving/moderate complexity  EVALUATION COMPLEXITY: Moderate   GOALS: Goals reviewed with patient? Yes  SHORT TERM GOALS: Target date: 07/15/24 I HEP Baseline: Goal status: INITIAL   LONG TERM GOALS: Target date: 09/12/24  Modified Oswestry Low Back Pain Disability less than 30% Baseline: 42% Goal status: INITIAL  2.  Improve 2 minute walk test to 360 feet Baseline: 200 feet Goal status: INITIAL  3. Decrease TUG to 14 seconds  Baseline: 20 seconds Goal status: INITIAL  4.  Improve Berg to 45/56  Baseline:  33/56  Goal Status: initial  5.  Report pain decreased 25% when she gets up in the morning  Baseline: pain up to 7-8/10  Goal Status:  initial    PLAN:  PT FREQUENCY: 2x/week  PT DURATION: 12 weeks  PLANNED INTERVENTIONS: 97110-Therapeutic exercises, 97530- Therapeutic activity, [id number]- Neuromuscular re-education, 97535- Self Care, 02859- Manual therapy, [id number]- Electrical stimulation (unattended), and 97033- Ionotophoresis 4mg /ml Dexamethasone   PLAN FOR NEXT SESSION: initiate cardiovascular equipment, light  strengthening for post chain LE musculature B , balance   Otoniel Myhand W, PT 06/14/2024, 11:03 AM  "

## 2024-06-18 NOTE — Progress Notes (Unsigned)
 [facility] [address] [place], [place] [address] [phone]  Date:  06/21/2024   Name:  Margaret [name]   DOB:  March 16, 1941   MRN:  [id number]  PCP:  Watt Harlene BROCKS, MD    Chief Complaint: No chief complaint on file.   History of Present Illness:  Margaret [name] is a 84 y.o. very pleasant female patient who presents with the following:  Patient seen today to discuss some concerns with her stomach.  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  Urology has recommended that she leave this alone  She follows up with podiatry regarding foot neuropathy and other issues with her feet She was in the ER back in November with nausea and vomiting-it looks like she complained of abdominal pain worse after eating for a few weeks but worse the previous few days CT showed an ileus  Gabapentin  Metoprolol  Omeprazole  HCTZ 12.5 Vicodin 7.5-she uses as needed for arthritis pain.  Most recently filled number 45 pills on the 30th  Discussed the use of AI scribe software for clinical note transcription with the patient, who gave verbal consent to proceed.  History of Present Illness     Patient Active Problem List   Diagnosis Date Noted   Renal mass 06/03/2022   Open wound of left side of back 01/20/2021   Lipoma of back 11/18/2020   Lymphedema 11/18/2020   Melanocytic nevi of right upper limb, including shoulder 11/18/2020   Personal history of malignant melanoma of skin 11/18/2020   Sebaceous cyst 11/18/2020   Seborrheic dermatitis 11/18/2020    Pre-diabetes 04/03/2020   OA (osteoarthritis) of knee 11/13/2019   Total knee replacement status, 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   Pain in right knee 08/03/2019   Fluid collection at surgical site 10/20/2018   Encounter for postoperative examination after surgery for malignant neoplasm 09/30/2018   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/2018   Plantar fasciitis of right foot 02/03/2017   Porokeratosis 02/03/2017   Acute gallstone pancreatitis    Calculus of gallbladder with acute cholecystitis and obstruction    LFT elevation    Stress fracture 01/01/2016   Insomnia 12/16/2015   Peripheral neuropathy 10/18/2015   Renal insufficiency 03/06/2015   HTN (hypertension) 03/06/2015   Osteopenia 03/06/2015   GERD (gastroesophageal reflux disease) 03/06/2015   Vaginal pessary present 03/06/2015   Metatarsalgia 07/13/2012   Increased frequency of urination 04/13/2012   DDD (degenerative disc disease), cervical 01/19/2012   Knee stiffness 01/19/2012   Low back pain 01/19/2012   Neck pain 01/19/2012   Pain of hand 01/19/2012   Hallux valgus 09/20/2011   Hyperkeratosis 08/13/2011   Anxiety disorder 07/06/2011   Depression 07/06/2011   Diverticulosis of large intestine 07/06/2011   Non-toxic multinodular goiter 07/06/2011   Atrophic vaginitis 10/24/2010   Dermatographic urticaria 09/17/2010   Inflamed seborrheic keratosis 09/17/2010   Closed fracture of fifth metatarsal bone 07/10/2010   Diagnosis unknown 02/27/2010   Hip pain 06/08/2008   Pulmonary nodule seen on imaging study 06/08/2008    Past Medical History:  Diagnosis Date   Anxiety  Arthritis    Cancer (HCC)    lt. foot melanoma   Chronic kidney disease    lt. kidney cyst   Coronary artery disease    GERD (gastroesophageal reflux disease)    History of healed stress fracture 2013   bilateral feet   Hx of phlebitis    reports remote hx of superfical blood clots never anticoagulated   Hypertension    Osteopenia     Pneumonia     Past Surgical History:  Procedure Laterality Date   CATARACT EXTRACTION W/ INTRAOCULAR LENS IMPLANT Bilateral    CHOLECYSTECTOMY N/A 09/19/2016   Procedure: LAPAROSCOPIC CHOLECYSTECTOMY POSSIBLE INTRAOPERATIVE CHOLANGIOGRAM;  Surgeon: Donnice Bury, MD;  Location: [facility] OR;  Service: General;  Laterality: N/A;   ERCP N/A 09/18/2016   Procedure: ENDOSCOPIC RETROGRADE CHOLANGIOPANCREATOGRAPHY (ERCP);  Surgeon: Victory LITTIE Legrand DOUGLAS, MD;  Location: [facility] ENDOSCOPY;  Service: Endoscopy;  Laterality: N/A;   FOOT SURGERY     patient reports four foot surgeries   FOOT SURGERY Left    bunion, hammer toe surgery   FOOT SURGERY Right    shaved bunion, hammer toe correction   HERNIA REPAIR     KNEE ARTHROSCOPY Right 06/02/1995   REPLACEMENT TOTAL KNEE Left    REPLACEMENT TOTAL KNEE Left 10/30/2012   RETINAL DETACHMENT SURGERY     RETINAL DETACHMENT SURGERY Left 06/01/2006   ROBOTIC ASSITED PARTIAL NEPHRECTOMY Left 06/03/2022   Procedure: XI ROBOTIC ASSITED PARTIAL NEPHRECTOMY WITH ULTRASOUND AND VENTRAL HERNIA REPAIR;  Surgeon: Alvaro Hummer, MD;  Location: [facility];  Service: Urology;  Laterality: Left;  3 HRS   THYROIDECTOMY, PARTIAL Right    THYROIDECTOMY, PARTIAL  06/02/2003   TOTAL KNEE ARTHROPLASTY Right 11/13/2019   Procedure: TOTAL KNEE ARTHROPLASTY SDDC;  Surgeon: Melodi Lerner, MD;  Location: [facility];  Service: Orthopedics;  Laterality: Right;     Social History[1]  Family History  Problem Relation Age of Onset   Heart disease Mother    Heart attack Mother    Heart disease Father    Diabetes Maternal Grandmother    Cancer Paternal Grandfather        stomach    Allergies[2]  Medication list has been reviewed and updated.  Medications Ordered Prior to Encounter[3]  Review of Systems:  As per HPI- otherwise negative.   Physical Examination: There were no vitals filed for this visit. There were no vitals filed for this visit. There is no height or weight  on file to calculate BMI. Ideal Body Weight:    GEN: no acute distress. HEENT: Atraumatic, Normocephalic.  Ears and Nose: No external deformity. CV: RRR, No M/G/R. No JVD. No thrill. No extra heart sounds. PULM: CTA B, no wheezes, crackles, rhonchi. No retractions. No resp. distress. No accessory muscle use. ABD: S, NT, ND, +BS. No rebound. No HSM. EXTR: No c/c/e PSYCH: Normally interactive. Conversant.    Assessment and Plan: No diagnosis found.  Assessment & Plan   Signed Harlene Schroeder, MD    [1]  Social History Tobacco Use   Smoking status: Former    Current packs/day: 0.00    Average packs/day: 0.3 packs/day for 15.0 years (3.8 ttl pk-yrs)    Types: Cigarettes    Start date: 06/01/1964    Quit date: 06/02/1979    Years since quitting: 45.0   Smokeless tobacco: Never  Vaping Use   Vaping status: Never Used  Substance Use Topics   Alcohol use: Yes    Alcohol/week: 1.0 standard drink of alcohol  Types: 1 Glasses of wine per week    Comment: Occ   Drug use: No  [2]  Allergies Allergen Reactions   Keflex  [Cephalexin ] Diarrhea   Percocet Adar.aden ] Other (See Comments)    Syncope (passed out)   Clindamycin /Lincomycin Rash  [3]  Current Outpatient Medications on File Prior to Visit  Medication Sig Dispense Refill   acetaminophen  (TYLENOL ) 500 MG tablet 1000 mg by oral route.     diazepam  (VALIUM ) 2 MG tablet Take 0.5-1 tablets (1-2 mg total) by mouth daily as needed for anxiety ( prior to the procedure). 5 tablet 0   diclofenac Sodium (VOLTAREN) 1 % GEL Apply 2 g topically 4 (four) times daily as needed (pain).     gabapentin  (NEURONTIN ) 300 MG capsule TAKE 1 CAPSULE BY MOUTH 3 TIMES A DAY 90 capsule 0   hydrochlorothiazide  (HYDRODIURIL ) 12.5 MG tablet TAKE 1 TABLET BY MOUTH DAILY AS NEEDED 90 tablet 0   metoprolol  succinate (TOPROL -XL) 25 MG 24 hr tablet TAKE 1 TABLET BY MOUTH DAILY .TAKE WITH OR IMMEDIATELY FOLLOWING A MEAL 90 tablet 1    NONFORMULARY OR COMPOUNDED ITEM [facility]:  Peripheral Neuropathy - Bupivacaine  1%, Doxepin 3%, Gabapentin  6%, Pentoxifylline 3%, Topiramate 1%. Apply 1-2 grams to affected area 3-4 times daily. 60 each 3   omeprazole  (PRILOSEC) 20 MG capsule Take 1 capsule (20 mg total) by mouth daily. 90 capsule 1   ondansetron  (ZOFRAN ) 4 MG tablet Take 1 tablet (4 mg total) by mouth every 6 (six) hours. 12 tablet 0   ondansetron  (ZOFRAN -ODT) 4 MG disintegrating tablet Take 1 tablet (4 mg total) by mouth every 8 (eight) hours as needed for nausea or vomiting. 20 tablet 0   No current facility-administered medications on file prior to visit.   "

## 2024-06-20 ENCOUNTER — Ambulatory Visit: Admitting: Physical Therapy

## 2024-06-20 DIAGNOSIS — M5459 Other low back pain: Secondary | ICD-10-CM

## 2024-06-20 DIAGNOSIS — R262 Difficulty in walking, not elsewhere classified: Secondary | ICD-10-CM

## 2024-06-20 DIAGNOSIS — G8929 Other chronic pain: Secondary | ICD-10-CM

## 2024-06-20 DIAGNOSIS — R293 Abnormal posture: Secondary | ICD-10-CM

## 2024-06-20 NOTE — Therapy (Signed)
 " OUTPATIENT PHYSICAL THERAPY LOWER EXTREMITY   Patient Name: Margaret [name] MRN: [id number] DOB:10-30-1940, 84 y.o., female Today's Date: 06/20/2024  END OF SESSION:  PT End of Session - 06/20/24 1445     Visit Number 2    Date for Recertification  09/12/24    Authorization Type HTA    PT Start Time 1445    PT Stop Time 1530    PT Time Calculation (min) 45 min           Past Medical History:  Diagnosis Date   Anxiety    Arthritis    Cancer (HCC)    lt. foot melanoma   Chronic kidney disease    lt. kidney cyst   Coronary artery disease    GERD (gastroesophageal reflux disease)    History of healed stress fracture 2013   bilateral feet   Hx of phlebitis    reports remote hx of superfical blood clots never anticoagulated   Hypertension    Osteopenia    Pneumonia    Past Surgical History:  Procedure Laterality Date   CATARACT EXTRACTION W/ INTRAOCULAR LENS IMPLANT Bilateral    CHOLECYSTECTOMY N/A 09/19/2016   Procedure: LAPAROSCOPIC CHOLECYSTECTOMY POSSIBLE INTRAOPERATIVE CHOLANGIOGRAM;  Surgeon: Donnice Bury, MD;  Location: [facility];  Service: General;  Laterality: N/A;   ERCP N/A 09/18/2016   Procedure: ENDOSCOPIC RETROGRADE CHOLANGIOPANCREATOGRAPHY (ERCP);  Surgeon: Victory LITTIE Legrand DOUGLAS, MD;  Location: [facility];  Service: Endoscopy;  Laterality: N/A;   FOOT SURGERY     patient reports four foot surgeries   FOOT SURGERY Left    bunion, hammer toe surgery   FOOT SURGERY Right    shaved bunion, hammer toe correction   HERNIA REPAIR     KNEE ARTHROSCOPY Right 06/02/1995   REPLACEMENT TOTAL KNEE Left    REPLACEMENT TOTAL KNEE Left 10/30/2012   RETINAL DETACHMENT SURGERY     RETINAL DETACHMENT SURGERY Left 06/01/2006   ROBOTIC ASSITED PARTIAL NEPHRECTOMY Left 06/03/2022   Procedure: XI ROBOTIC ASSITED PARTIAL NEPHRECTOMY WITH ULTRASOUND AND VENTRAL HERNIA REPAIR;  Surgeon: Alvaro Hummer, MD;  Location: [facility];  Service: Urology;  Laterality: Left;  3 HRS    THYROIDECTOMY, PARTIAL Right    THYROIDECTOMY, PARTIAL  06/02/2003   TOTAL KNEE ARTHROPLASTY Right 11/13/2019   Procedure: TOTAL KNEE ARTHROPLASTY SDDC;  Surgeon: Melodi Lerner, MD;  Location: [facility];  Service: Orthopedics;  Laterality: Right;    Patient Active Problem List   Diagnosis Date Noted   Renal mass 06/03/2022   Open wound of left side of back 01/20/2021   Lipoma of back 11/18/2020   Lymphedema 11/18/2020   Melanocytic nevi of right upper limb, including shoulder 11/18/2020   Personal history of malignant melanoma of skin 11/18/2020   Sebaceous cyst 11/18/2020   Seborrheic dermatitis 11/18/2020   Pre-diabetes 04/03/2020   OA (osteoarthritis) of knee 11/13/2019   Total knee replacement status, 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   Pain in right knee 08/03/2019   Fluid collection at surgical site 10/20/2018   Encounter for postoperative examination after surgery for malignant neoplasm 09/30/2018   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/2018   Plantar fasciitis of right foot 02/03/2017   Porokeratosis 02/03/2017   Acute gallstone pancreatitis    Calculus of gallbladder with acute cholecystitis and obstruction    LFT elevation    Stress fracture 01/01/2016   Insomnia 12/16/2015   Peripheral neuropathy 10/18/2015   Renal insufficiency 03/06/2015   HTN (hypertension) 03/06/2015   Osteopenia 03/06/2015  GERD (gastroesophageal reflux disease) 03/06/2015   Vaginal pessary present 03/06/2015   Metatarsalgia 07/13/2012   Increased frequency of urination 04/13/2012   DDD (degenerative disc disease), cervical 01/19/2012   Knee stiffness 01/19/2012   Low back pain 01/19/2012   Neck pain 01/19/2012   Pain of hand 01/19/2012   Hallux valgus 09/20/2011   Hyperkeratosis 08/13/2011   Anxiety disorder 07/06/2011   Depression 07/06/2011   Diverticulosis of large intestine 07/06/2011   Non-toxic multinodular goiter 07/06/2011   Atrophic vaginitis 10/24/2010   Dermatographic  urticaria 09/17/2010   Inflamed seborrheic keratosis 09/17/2010   Closed fracture of fifth metatarsal bone 07/10/2010   Diagnosis unknown 02/27/2010   Hip pain 06/08/2008   Pulmonary nodule seen on imaging study 06/08/2008    PCP: Watt Raisin, MD  REFERRING PROVIDER: Charlie Dolores, MD, [facility]  REFERRING DIAG: degeneration of discs of lumbar spine  THERAPY DIAG:  Chronic bilateral low back pain with right-sided sciatica  Difficulty in walking, not elsewhere classified  Posture imbalance  Other low back pain  Chronic bilateral low back pain with left-sided sciatica  Rationale for Evaluation and Treatment: Rehabilitation  ONSET DATE: chronic  SUBJECTIVE:   SUBJECTIVE STATEMENT:  amb in without AD with boot on RT foot so antalgic and throws of balance and increases LBP. States foot pain is worse than back. Feb 18th is foot surgery.   We saw Margaret [name] last year with good progress in her overall function, she however had an ED visit 04/22/24 and 04/23/24.   Reports my back is killing me.  She has a walking boot on now on the right and reports that this is causing more pain.  Will have procedure on the foot 07/19/24.  I am really having more difficulty getting up and down especially when out to eat  PERTINENT HISTORY: Saw orthopedist, took prednisone, which didn't help, orthopedist may want to do MRI, referred to PT by both PCP and orthopedist, also B degenerative arthritis hips and back PAIN:  Are you having pain? Back 4/10 . Rt foot 9/10  PRECAUTIONS: Fall  RED FLAGS: None   WEIGHT BEARING RESTRICTIONS: No  FALLS:  Has patient fallen in last 6 months? Yes. Number of falls reports that she has been very careful  LIVING ENVIRONMENT: Lives with: lives with their family and lives with their daughter Lives in: House/apartment Stairs: Yes: Internal: one flight steps; on left going up Has following equipment at home: Walker - 4 wheeled in the morning, once she gets  going I sometimes don't use anything  OCCUPATION: retired  PLOF: Independent and Independent with basic ADLs reports that she does some cooking and cleaning, can't stand much more than 20 minutes  PATIENT GOALS: reduce pain hips and lower back so I can tolerate more activity, get up and down easier  NEXT MD VISIT: not scheduled yet  OBJECTIVE:  Note: Objective measures were completed at Evaluation unless otherwise noted.  DIAGNOSTIC FINDINGS: X-ray done 11/23; IMPRESSION: 1. 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 2. Lower lumbar spondylosis. 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  - Multilevel lumbar degenerative disc disease   PATIENT SURVEYS:  ODI:  42%  COGNITION: Overall cognitive status: Within functional limits for tasks assessed     SENSATION: Light touch: WFL  EDEMA:  None noted   MUSCLE LENGTH: Hamstrings: Right wfl deg; Left wfl deg Margaret [name] test: Right wfl deg; Left wfl deg  POSTURE: scoliotic posture noted concavity L  lumbar, pronounced thoracic kyphosis  PALPATION: Not tender with light palpation B gr trochanters hips, feels tight in the lumbar mms  LOWER EXTREMITY ROM:B hip ROM wnl LUMBAR ROM: flexion decreased 50%, extension and side bending decreased 75% with pain   LOWER EXTREMITY MMT:  MMT Right eval Left eval  Hip flexion 4- 4-  Hip extension    Hip abduction 3+ 3+  Hip adduction 4- 4-  Hip internal rotation    Hip external rotation    Knee flexion 4- 4-  Knee extension 4- 4-  Ankle dorsiflexion    Ankle plantarflexion    Ankle inversion    Ankle eversion     (Blank rows = not tested)   FUNCTIONAL TESTS:  Timed up and go (TUG): 20 seconds no device, has to use hands to get up from sitting 2 minute walk test: 200 feet with supervision, the more she walked the more stooped she became, with SOB Berg: 33/56   GAIT: Distance walked: in clinic 200 feet, no device but with  supervision, had walking boot on the right due to foot issue, she became short of breath, and the more she walked the more stooped she became and reported increased LBP with the walk                                                                                                                                TREATMENT DATE:  06/20/24  Nustep L 5 7 min STS with wt ball chest press 10 x Standing shld ext and row 2 sets 10 red tband Blue tband trunk ext seated 2 sets 10 Blue tband hip flex and abd 2 sets 10 Green tband HS curl 2 set 10 BIL 3# 2 sets 10 LAQ Standing HHA 10x marching,SL hip flex,ext and abd   06/14/24 Evaluation   Evaluation, explained to pt that long term management of arthritic changes in hips in spine, should respond well to moderate exercise which she did last year and reports that she feels she just needs to get back to what we were doing.   she is a high fall risk. Nustep level 4 x 6 minutes  PATIENT EDUCATION:  Education details: POC, goals Person educated: Patient Education method: Explanation, Demonstration, Tactile cues, and Verbal cues Education comprehension: verbalized understanding and returned demonstration  HOME EXERCISE PROGRAM: Will need to add HEP.   ASSESSMENT:  CLINICAL IMPRESSION:amb in without AD with boot on RT foot so antalgic and throws of balance and increases LBP. States foot pain is worse than back. Feb 18th is foot surgery.told her we will try to build strength back up before surgery and then she states may need a 2 week hold but unsure at this time. Progressed ex for strength, weakness noted with balance issues caused by decreased strength and boot.     Patient is a 84 y.o. female who was evaluated today by physical therapy due to chronic central  lower back pain as well as B hip pain.  She had experienced several falls last year but we saw her last fall and she reports no falls in the past 6 months, she has had some other health issues  that she reports has caused her to have more difficulty walking and getting up from sitting especially when out to eat.  When had not done the BERG with her before and she scored 33/56 which puts her at a high risk for falls. Recommended standard front wheel walker for community outings.  Presents with postural changes in lumbar spine and gross B LE weakness .  Will benefit from skilled physical therapy to address her deficits and improve her pain, safety, functional mobility. OBJECTIVE IMPAIRMENTS: decreased activity tolerance, decreased balance, decreased knowledge of condition, decreased knowledge of use of DME, decreased mobility, difficulty walking, decreased ROM, decreased strength, decreased safety awareness, impaired perceived functional ability, postural dysfunction, and pain.   ACTIVITY LIMITATIONS: carrying, lifting, bending, standing, squatting, transfers, bed mobility, and locomotion level  PARTICIPATION LIMITATIONS: meal prep, cleaning, laundry, shopping, community activity, and yard work  PERSONAL FACTORS: Age, Behavior pattern, Fitness, Past/current experiences, Time since onset of injury/illness/exacerbation, and 1-2 comorbidities: severe DJD B hips, lumbar region, neuropathy are also affecting patient's functional outcome.   REHAB POTENTIAL: Good  CLINICAL DECISION MAKING: Evolving/moderate complexity  EVALUATION COMPLEXITY: Moderate   GOALS: Goals reviewed with patient? Yes  SHORT TERM GOALS: Target date: 07/15/24 I HEP Baseline: Goal status: INITIAL   LONG TERM GOALS: Target date: 09/12/24  Modified Oswestry Low Back Pain Disability less than 30% Baseline: 42% Goal status: INITIAL  2.  Improve 2 minute walk test to 360 feet Baseline: 200 feet Goal status: INITIAL  3. Decrease TUG to 14 seconds  Baseline: 20 seconds Goal status: INITIAL  4.  Improve Berg to 45/56  Baseline:  33/56  Goal Status: initial  5.  Report pain decreased 25% when she gets up in the  morning  Baseline: pain up to 7-8/10  Goal Status:  initial    PLAN:  PT FREQUENCY: 2x/week  PT DURATION: 12 weeks  PLANNED INTERVENTIONS: 97110-Therapeutic exercises, 97530- Therapeutic activity, [id number]- Neuromuscular re-education, 97535- Self Care, [id number]- Manual therapy, [id number]- Electrical stimulation (unattended), and 97033- Ionotophoresis 4mg /ml Dexamethasone   PLAN FOR NEXT SESSION: initiate cardiovascular equipment, light strengthening for post chain LE musculature B , balance   Damarea Merkel,ANGIE, PTA 06/20/2024, 2:46 PM  "

## 2024-06-21 ENCOUNTER — Encounter: Payer: Self-pay | Admitting: Family Medicine

## 2024-06-21 ENCOUNTER — Ambulatory Visit: Admitting: Family Medicine

## 2024-06-21 VITALS — BP 124/84 | HR 63 | Temp 98.0°F | Resp 18 | Ht 61.75 in | Wt 151.8 lb

## 2024-06-21 DIAGNOSIS — D72829 Elevated white blood cell count, unspecified: Secondary | ICD-10-CM | POA: Diagnosis not present

## 2024-06-21 DIAGNOSIS — K567 Ileus, unspecified: Secondary | ICD-10-CM

## 2024-06-21 NOTE — Patient Instructions (Signed)
 Good to see you , I will be in touch with your results asap!

## 2024-06-22 ENCOUNTER — Ambulatory Visit: Admitting: Physical Therapy

## 2024-06-22 ENCOUNTER — Ambulatory Visit: Admitting: Family Medicine

## 2024-06-22 ENCOUNTER — Encounter: Payer: Self-pay | Admitting: Family Medicine

## 2024-06-22 DIAGNOSIS — G8929 Other chronic pain: Secondary | ICD-10-CM

## 2024-06-22 DIAGNOSIS — M5459 Other low back pain: Secondary | ICD-10-CM

## 2024-06-22 DIAGNOSIS — R262 Difficulty in walking, not elsewhere classified: Secondary | ICD-10-CM

## 2024-06-22 DIAGNOSIS — R293 Abnormal posture: Secondary | ICD-10-CM

## 2024-06-22 LAB — BASIC METABOLIC PANEL WITH GFR
BUN: 20 mg/dL (ref 6–23)
CO2: 32 meq/L (ref 19–32)
Calcium: 10.2 mg/dL (ref 8.4–10.5)
Chloride: 100 meq/L (ref 96–112)
Creatinine, Ser: 1.18 mg/dL (ref 0.40–1.20)
GFR: 42.61 mL/min — ABNORMAL LOW
Glucose, Bld: 98 mg/dL (ref 70–99)
Potassium: 4 meq/L (ref 3.5–5.1)
Sodium: 142 meq/L (ref 135–145)

## 2024-06-22 LAB — CBC
HCT: 38.7 % (ref 36.0–46.0)
Hemoglobin: 12.9 g/dL (ref 12.0–15.0)
MCHC: 33.4 g/dL (ref 30.0–36.0)
MCV: 95.9 fl (ref 78.0–100.0)
Platelets: 290 K/uL (ref 150.0–400.0)
RBC: 4.03 Mil/uL (ref 3.87–5.11)
RDW: 14.6 % (ref 11.5–15.5)
WBC: 8.3 K/uL (ref 4.0–10.5)

## 2024-06-22 NOTE — Therapy (Signed)
 " OUTPATIENT PHYSICAL THERAPY LOWER EXTREMITY   Patient Name: Margaret [name] MRN: [id number] DOB:1941-05-21, 84 y.o., female Today's Date: 06/22/2024  END OF SESSION:  PT End of Session - 06/22/24 1346     Visit Number 3    Date for Recertification  09/12/24    Authorization Type HTA    PT Start Time 1447    PT Stop Time 1535    PT Time Calculation (min) 48 min           Past Medical History:  Diagnosis Date   Anxiety    Arthritis    Cancer (HCC)    lt. foot melanoma   Chronic kidney disease    lt. kidney cyst   Coronary artery disease    GERD (gastroesophageal reflux disease)    History of healed stress fracture 2013   bilateral feet   Hx of phlebitis    reports remote hx of superfical blood clots never anticoagulated   Hypertension    Osteopenia    Pneumonia    Past Surgical History:  Procedure Laterality Date   CATARACT EXTRACTION W/ INTRAOCULAR LENS IMPLANT Bilateral    CHOLECYSTECTOMY N/A 09/19/2016   Procedure: LAPAROSCOPIC CHOLECYSTECTOMY POSSIBLE INTRAOPERATIVE CHOLANGIOGRAM;  Surgeon: Margaret Bury, MD;  Location: [facility] OR;  Service: General;  Laterality: N/A;   ERCP N/A 09/18/2016   Procedure: ENDOSCOPIC RETROGRADE CHOLANGIOPANCREATOGRAPHY (ERCP);  Surgeon: Margaret LITTIE Legrand DOUGLAS, MD;  Location: [facility] ENDOSCOPY;  Service: Endoscopy;  Laterality: N/A;   FOOT SURGERY     patient reports four foot surgeries   FOOT SURGERY Left    bunion, hammer toe surgery   FOOT SURGERY Right    shaved bunion, hammer toe correction   HERNIA REPAIR     KNEE ARTHROSCOPY Right 06/02/1995   REPLACEMENT TOTAL KNEE Left    REPLACEMENT TOTAL KNEE Left 10/30/2012   RETINAL DETACHMENT SURGERY     RETINAL DETACHMENT SURGERY Left 06/01/2006   ROBOTIC ASSITED PARTIAL NEPHRECTOMY Left 06/03/2022   Procedure: XI ROBOTIC ASSITED PARTIAL NEPHRECTOMY WITH ULTRASOUND AND VENTRAL HERNIA REPAIR;  Surgeon: Margaret Hummer, MD;  Location: [facility] ORS;  Service: Urology;  Laterality: Left;  3 HRS    THYROIDECTOMY, PARTIAL Right    THYROIDECTOMY, PARTIAL  06/02/2003   TOTAL KNEE ARTHROPLASTY Right 11/13/2019   Procedure: TOTAL KNEE ARTHROPLASTY SDDC;  Surgeon: Margaret Lerner, MD;  Location: [facility] ORS;  Service: Orthopedics;  Laterality: Right;    Patient Active Problem List   Diagnosis Date Noted   Renal mass 06/03/2022   Open wound of left side of back 01/20/2021   Lipoma of back 11/18/2020   Lymphedema 11/18/2020   Melanocytic nevi of right upper limb, including shoulder 11/18/2020   Personal history of malignant melanoma of skin 11/18/2020   Sebaceous cyst 11/18/2020   Seborrheic dermatitis 11/18/2020   Pre-diabetes 04/03/2020   OA (osteoarthritis) of knee 11/13/2019   Total knee replacement status, 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   Pain in right knee 08/03/2019   Fluid collection at surgical site 10/20/2018   Encounter for postoperative examination after surgery for malignant neoplasm 09/30/2018   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/2018   Plantar fasciitis of right foot 02/03/2017   Porokeratosis 02/03/2017   Acute gallstone pancreatitis    Calculus of gallbladder with acute cholecystitis and obstruction    LFT elevation    Stress fracture 01/01/2016   Insomnia 12/16/2015   Peripheral neuropathy 10/18/2015   Renal insufficiency 03/06/2015   HTN (hypertension) 03/06/2015   Osteopenia 03/06/2015  GERD (gastroesophageal reflux disease) 03/06/2015   Vaginal pessary present 03/06/2015   Metatarsalgia 07/13/2012   Increased frequency of urination 04/13/2012   DDD (degenerative disc disease), cervical 01/19/2012   Knee stiffness 01/19/2012   Low back pain 01/19/2012   Neck pain 01/19/2012   Pain of hand 01/19/2012   Hallux valgus 09/20/2011   Hyperkeratosis 08/13/2011   Anxiety disorder 07/06/2011   Depression 07/06/2011   Diverticulosis of large intestine 07/06/2011   Non-toxic multinodular goiter 07/06/2011   Atrophic vaginitis 10/24/2010   Dermatographic  urticaria 09/17/2010   Inflamed seborrheic keratosis 09/17/2010   Closed fracture of fifth metatarsal bone 07/10/2010   Diagnosis unknown 02/27/2010   Hip pain 06/08/2008   Pulmonary nodule seen on imaging study 06/08/2008    PCP: Margaret Raisin, MD  REFERRING PROVIDER: Charlie Dolores, MD, [facility]  REFERRING DIAG: degeneration of discs of lumbar spine  THERAPY DIAG:  Chronic bilateral low back pain with right-sided sciatica  Difficulty in walking, not elsewhere classified  Posture imbalance  Other low back pain  Chronic bilateral low back pain with left-sided sciatica  Rationale for Evaluation and Treatment: Rehabilitation  ONSET DATE: chronic  SUBJECTIVE:   SUBJECTIVE STATEMENT:  fine after last session. Tried to walk to mailbox but once there small lip and I froze and could not get up  We saw Margaret [name] last year with good progress in her overall function, she however had an ED visit 04/22/24 and 04/23/24.   Reports my back is killing me.  She has a walking boot on now on the right and reports that this is causing more pain.  Will have procedure on the foot 07/19/24.  I am really having more difficulty getting up and down especially when out to eat  PERTINENT HISTORY: Saw orthopedist, took prednisone, which didn't help, orthopedist may want to do MRI, referred to PT by both PCP and orthopedist, also B degenerative arthritis hips and back PAIN:  Are you having pain? Back 1-2/10 . Rt foot 9/10  PRECAUTIONS: Fall  RED FLAGS: None   WEIGHT BEARING RESTRICTIONS: No  FALLS:  Has patient fallen in last 6 months? Yes. Number of falls reports that she has been very careful  LIVING ENVIRONMENT: Lives with: lives with their family and lives with their daughter Lives in: House/apartment Stairs: Yes: Internal: one flight steps; on left going up Has following equipment at home: Walker - 4 wheeled in the morning, once she gets going I sometimes don't use  anything  OCCUPATION: retired  PLOF: Independent and Independent with basic ADLs reports that she does some cooking and cleaning, can't stand much more than 20 minutes  PATIENT GOALS: reduce pain hips and lower back so I can tolerate more activity, get up and down easier  NEXT MD VISIT: not scheduled yet  OBJECTIVE:  Note: Objective measures were completed at Evaluation unless otherwise noted.  DIAGNOSTIC FINDINGS: X-ray done 11/23; IMPRESSION: 1. 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 2. Lower lumbar spondylosis. 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  - Multilevel lumbar degenerative disc disease   PATIENT SURVEYS:  ODI:  42%  COGNITION: Overall cognitive status: Within functional limits for tasks assessed     SENSATION: Light touch: WFL  EDEMA:  None noted   MUSCLE LENGTH: Hamstrings: Right wfl deg; Left wfl deg Debby test: Right wfl deg; Left wfl deg  POSTURE: scoliotic posture noted concavity L lumbar, pronounced thoracic kyphosis  PALPATION: Not tender with  light palpation B gr trochanters hips, feels tight in the lumbar mms  LOWER EXTREMITY ROM:B hip ROM wnl LUMBAR ROM: flexion decreased 50%, extension and side bending decreased 75% with pain   LOWER EXTREMITY MMT:  MMT Right eval Left eval  Hip flexion 4- 4-  Hip extension    Hip abduction 3+ 3+  Hip adduction 4- 4-  Hip internal rotation    Hip external rotation    Knee flexion 4- 4-  Knee extension 4- 4-  Ankle dorsiflexion    Ankle plantarflexion    Ankle inversion    Ankle eversion     (Blank rows = not tested)   FUNCTIONAL TESTS:  Timed up and go (TUG): 20 seconds no device, has to use hands to get up from sitting 2 minute walk test: 200 feet with supervision, the more she walked the more stooped she became, with SOB Berg: 33/56   GAIT: Distance walked: in clinic 200 feet, no device but with supervision, had walking boot  on the right due to foot issue, she became short of breath, and the more she walked the more stooped she became and reported increased LBP with the walk                                                                                                                                TREATMENT DATE:  06/22/24 Nustep L 5 STS with wt ball chest press 10 x Blue tband back ext 2 sets 10 Amb with 5# in 1 hand then changed hands 2nd lap HS curl 20# 2 sets 10 LAQ 5 # 2 sets 10 6 inch step tap 10 x each leg then alt for 20 x. CG-minA with cuing needed 20# resisted gait 5 x fwd, 5 x back, 3 x each side CG-minA with light handheld assist Obstacle course working on stepping on airex and 4 inch step HHA and CG-minA with cuing, very fearful and freezes up. Posterior LOB Red tband HHA SL hip flex,ext and abd 10 x Red tband standing shld ext and row 10 x    06/20/24  Nustep L 5 7 min STS with wt ball chest press 10 x Standing shld ext and row 2 sets 10 red tband Blue tband trunk ext seated 2 sets 10 Blue tband hip flex and abd 2 sets 10 Green tband HS curl 2 set 10 BIL 3# 2 sets 10 LAQ Standing HHA 10x marching,SL hip flex,ext and abd   06/14/24 Evaluation   Evaluation, explained to pt that long term management of arthritic changes in hips in spine, should respond well to moderate exercise which she did last year and reports that she feels she just needs to get back to what we were doing.   she is a high fall risk. Nustep level 4 x 6 minutes  PATIENT EDUCATION:  Education details: POC, goals Person educated: Patient Education method: Explanation, Demonstration, Tactile cues, and Verbal  cues Education comprehension: verbalized understanding and returned demonstration  HOME EXERCISE PROGRAM: Will need to add HEP.   ASSESSMENT:  CLINICAL IMPRESSION:Goals Assessed and documented. Progressed strengthening with assistance and cuing. Balance ex with difficulty, freezes with stepping as she  is very afraid of falling so we practiced this as it was an issue the other day walking to mailbox.    Patient is a 84 y.o. female who was evaluated today by physical therapy due to chronic central lower back pain as well as B hip pain.  She had experienced several falls last year but we saw her last fall and she reports no falls in the past 6 months, she has had some other health issues that she reports has caused her to have more difficulty walking and getting up from sitting especially when out to eat.  When had not done the BERG with her before and she scored 33/56 which puts her at a high risk for falls. Recommended standard front wheel walker for community outings.  Presents with postural changes in lumbar spine and gross B LE weakness .  Will benefit from skilled physical therapy to address her deficits and improve her pain, safety, functional mobility. OBJECTIVE IMPAIRMENTS: decreased activity tolerance, decreased balance, decreased knowledge of condition, decreased knowledge of use of DME, decreased mobility, difficulty walking, decreased ROM, decreased strength, decreased safety awareness, impaired perceived functional ability, postural dysfunction, and pain.   ACTIVITY LIMITATIONS: carrying, lifting, bending, standing, squatting, transfers, bed mobility, and locomotion level  PARTICIPATION LIMITATIONS: meal prep, cleaning, laundry, shopping, community activity, and yard work  PERSONAL FACTORS: Age, Behavior pattern, Fitness, Past/current experiences, Time since onset of injury/illness/exacerbation, and 1-2 comorbidities: severe DJD B hips, lumbar region, neuropathy are also affecting patient's functional outcome.   REHAB POTENTIAL: Good  CLINICAL DECISION MAKING: Evolving/moderate complexity  EVALUATION COMPLEXITY: Moderate   GOALS: Goals reviewed with patient? Yes  SHORT TERM GOALS: Target date: 07/15/24 I HEP Baseline: Goal status: progressing 06/22/24   LONG TERM GOALS:  Target date: 09/12/24  Modified Oswestry Low Back Pain Disability less than 30% Baseline: 42% Goal status: INITIAL  2.  Improve 2 minute walk test to 360 feet Baseline: 200 feet Goal status: INITIAL  3. Decrease TUG to 14 seconds  Baseline: 20 seconds Goal status: 06/22/24 no AD  16 sec  4.  Improve Berg to 45/56  Baseline:  33/56  Goal Status: initial  5.  Report pain decreased 25% when she gets up in the morning  Baseline: pain up to 7-8/10  Goal Status:  initial    PLAN:  PT FREQUENCY: 2x/week  PT DURATION: 12 weeks  PLANNED INTERVENTIONS: 97110-Therapeutic exercises, 97530- Therapeutic activity, [id number]- Neuromuscular re-education, 97535- Self Care, [id number]- Manual therapy, [id number]- Electrical stimulation (unattended), and 97033- Ionotophoresis 4mg /ml Dexamethasone   PLAN FOR NEXT SESSION: progress strength,balance and gait Jeryn Bertoni,ANGIE, PTA 06/22/2024, 1:47 PM  "

## 2024-06-26 ENCOUNTER — Ambulatory Visit: Admitting: Podiatry

## 2024-06-27 ENCOUNTER — Ambulatory Visit: Admitting: Physical Therapy

## 2024-06-27 DIAGNOSIS — G8929 Other chronic pain: Secondary | ICD-10-CM

## 2024-06-27 DIAGNOSIS — M5459 Other low back pain: Secondary | ICD-10-CM

## 2024-06-27 DIAGNOSIS — R262 Difficulty in walking, not elsewhere classified: Secondary | ICD-10-CM

## 2024-06-27 DIAGNOSIS — R293 Abnormal posture: Secondary | ICD-10-CM

## 2024-06-27 NOTE — Therapy (Signed)
 " OUTPATIENT PHYSICAL THERAPY LOWER EXTREMITY   Patient Name: Margaret [name] MRN: [id number] DOB:12-12-1940, 84 y.o., female Today's Date: 06/27/2024  END OF SESSION:  PT End of Session - 06/27/24 1400     Visit Number 4    Date for Recertification  09/12/24    Authorization Type HTA    PT Start Time 1355    PT Stop Time 1440    PT Time Calculation (min) 45 min           Past Medical History:  Diagnosis Date   Anxiety    Arthritis    Cancer (HCC)    lt. foot melanoma   Chronic kidney disease    lt. kidney cyst   Coronary artery disease    GERD (gastroesophageal reflux disease)    History of healed stress fracture 2013   bilateral feet   Hx of phlebitis    reports remote hx of superfical blood clots never anticoagulated   Hypertension    Osteopenia    Pneumonia    Past Surgical History:  Procedure Laterality Date   CATARACT EXTRACTION W/ INTRAOCULAR LENS IMPLANT Bilateral    CHOLECYSTECTOMY N/A 09/19/2016   Procedure: LAPAROSCOPIC CHOLECYSTECTOMY POSSIBLE INTRAOPERATIVE CHOLANGIOGRAM;  Surgeon: Donnice Bury, MD;  Location: [facility] OR;  Service: General;  Laterality: N/A;   ERCP N/A 09/18/2016   Procedure: ENDOSCOPIC RETROGRADE CHOLANGIOPANCREATOGRAPHY (ERCP);  Surgeon: Victory LITTIE Legrand DOUGLAS, MD;  Location: [facility] ENDOSCOPY;  Service: Endoscopy;  Laterality: N/A;   FOOT SURGERY     patient reports four foot surgeries   FOOT SURGERY Left    bunion, hammer toe surgery   FOOT SURGERY Right    shaved bunion, hammer toe correction   HERNIA REPAIR     KNEE ARTHROSCOPY Right 06/02/1995   REPLACEMENT TOTAL KNEE Left    REPLACEMENT TOTAL KNEE Left 10/30/2012   RETINAL DETACHMENT SURGERY     RETINAL DETACHMENT SURGERY Left 06/01/2006   ROBOTIC ASSITED PARTIAL NEPHRECTOMY Left 06/03/2022   Procedure: XI ROBOTIC ASSITED PARTIAL NEPHRECTOMY WITH ULTRASOUND AND VENTRAL HERNIA REPAIR;  Surgeon: Alvaro Hummer, MD;  Location: [facility] ORS;  Service: Urology;  Laterality: Left;  3 HRS    THYROIDECTOMY, PARTIAL Right    THYROIDECTOMY, PARTIAL  06/02/2003   TOTAL KNEE ARTHROPLASTY Right 11/13/2019   Procedure: TOTAL KNEE ARTHROPLASTY SDDC;  Surgeon: Melodi Lerner, MD;  Location: [facility] ORS;  Service: Orthopedics;  Laterality: Right;    Patient Active Problem List   Diagnosis Date Noted   Renal mass 06/03/2022   Open wound of left side of back 01/20/2021   Lipoma of back 11/18/2020   Lymphedema 11/18/2020   Melanocytic nevi of right upper limb, including shoulder 11/18/2020   Personal history of malignant melanoma of skin 11/18/2020   Sebaceous cyst 11/18/2020   Seborrheic dermatitis 11/18/2020   Pre-diabetes 04/03/2020   OA (osteoarthritis) of knee 11/13/2019   Total knee replacement status, 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   Pain in right knee 08/03/2019   Fluid collection at surgical site 10/20/2018   Encounter for postoperative examination after surgery for malignant neoplasm 09/30/2018   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/2018   Plantar fasciitis of right foot 02/03/2017   Porokeratosis 02/03/2017   Acute gallstone pancreatitis    Calculus of gallbladder with acute cholecystitis and obstruction    LFT elevation    Stress fracture 01/01/2016   Insomnia 12/16/2015   Peripheral neuropathy 10/18/2015   Renal insufficiency 03/06/2015   HTN (hypertension) 03/06/2015   Osteopenia 03/06/2015  GERD (gastroesophageal reflux disease) 03/06/2015   Vaginal pessary present 03/06/2015   Metatarsalgia 07/13/2012   Increased frequency of urination 04/13/2012   DDD (degenerative disc disease), cervical 01/19/2012   Knee stiffness 01/19/2012   Low back pain 01/19/2012   Neck pain 01/19/2012   Pain of hand 01/19/2012   Hallux valgus 09/20/2011   Hyperkeratosis 08/13/2011   Anxiety disorder 07/06/2011   Depression 07/06/2011   Diverticulosis of large intestine 07/06/2011   Non-toxic multinodular goiter 07/06/2011   Atrophic vaginitis 10/24/2010   Dermatographic  urticaria 09/17/2010   Inflamed seborrheic keratosis 09/17/2010   Closed fracture of fifth metatarsal bone 07/10/2010   Diagnosis unknown 02/27/2010   Hip pain 06/08/2008   Pulmonary nodule seen on imaging study 06/08/2008    PCP: Watt Raisin, MD  REFERRING PROVIDER: Charlie Dolores, MD, [facility]  REFERRING DIAG: degeneration of discs of lumbar spine  THERAPY DIAG:  Chronic bilateral low back pain with right-sided sciatica  Difficulty in walking, not elsewhere classified  Posture imbalance  Other low back pain  Rationale for Evaluation and Treatment: Rehabilitation  ONSET DATE: chronic  SUBJECTIVE:   SUBJECTIVE STATEMENT:  doing okay. Afraid of falling on ice  We saw [place] last year with good progress in her overall function, she however had an ED visit 04/22/24 and 04/23/24.   Reports my back is killing me.  She has a walking boot on now on the right and reports that this is causing more pain.  Will have procedure on the foot 07/19/24.  I am really having more difficulty getting up and down especially when out to eat  PERTINENT HISTORY: Saw orthopedist, took prednisone, which didn't help, orthopedist may want to do MRI, referred to PT by both PCP and orthopedist, also B degenerative arthritis hips and back PAIN:  Are you having pain? Back 1-2/10 . Rt foot 9/10  PRECAUTIONS: Fall  RED FLAGS: None   WEIGHT BEARING RESTRICTIONS: No  FALLS:  Has patient fallen in last 6 months? Yes. Number of falls reports that she has been very careful  LIVING ENVIRONMENT: Lives with: lives with their family and lives with their daughter Lives in: House/apartment Stairs: Yes: Internal: one flight steps; on left going up Has following equipment at home: Walker - 4 wheeled in the morning, once she gets going I sometimes don't use anything  OCCUPATION: retired  PLOF: Independent and Independent with basic ADLs reports that she does some cooking and cleaning, can't stand much  more than 20 minutes  PATIENT GOALS: reduce pain hips and lower back so I can tolerate more activity, get up and down easier  NEXT MD VISIT: not scheduled yet  OBJECTIVE:  Note: Objective measures were completed at Evaluation unless otherwise noted.  DIAGNOSTIC FINDINGS: X-ray done 11/23; IMPRESSION: 1. 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 2. Lower lumbar spondylosis. 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  - Multilevel lumbar degenerative disc disease   PATIENT SURVEYS:  ODI:  42%  COGNITION: Overall cognitive status: Within functional limits for tasks assessed     SENSATION: Light touch: WFL  EDEMA:  None noted   MUSCLE LENGTH: Hamstrings: Right wfl deg; Left wfl deg Debby test: Right wfl deg; Left wfl deg  POSTURE: scoliotic posture noted concavity L lumbar, pronounced thoracic kyphosis  PALPATION: Not tender with light palpation B gr trochanters hips, feels tight in the lumbar mms  LOWER EXTREMITY ROM:B hip ROM wnl LUMBAR ROM: flexion decreased 50%,  extension and side bending decreased 75% with pain   LOWER EXTREMITY MMT:  MMT Right eval Left eval  Hip flexion 4- 4-  Hip extension    Hip abduction 3+ 3+  Hip adduction 4- 4-  Hip internal rotation    Hip external rotation    Knee flexion 4- 4-  Knee extension 4- 4-  Ankle dorsiflexion    Ankle plantarflexion    Ankle inversion    Ankle eversion     (Blank rows = not tested)   FUNCTIONAL TESTS:  Timed up and go (TUG): 20 seconds no device, has to use hands to get up from sitting 2 minute walk test: 200 feet with supervision, the more she walked the more stooped she became, with SOB Berg: 33/56   GAIT: Distance walked: in clinic 200 feet, no device but with supervision, had walking boot on the right due to foot issue, she became short of breath, and the more she walked the more stooped she became and reported increased LBP with the  walk                                                                                                                                TREATMENT DATE:  06/27/24 Nustep L 5 20# resisted gait 5 x fwd, 5 x back, 3 x each side CG-minA with light handheld assist 6 in step up 10 x each leg. Alt 6 in taps 20 x CG-min A STS on airex 2 sets 5 min A with multi attempts HS curl 20# 2 sets 10 Knee ext 5# 2 sets 10 Ball toss on airex CGA, no LOB Red tband standing trunk rotation 10 x each way Foam bean in // bars tandem fwd and back and side stepping 3 x each CGA  06/22/24 Nustep L 5 STS with wt ball chest press 10 x Blue tband back ext 2 sets 10 Amb with 5# in 1 hand then changed hands 2nd lap HS curl 20# 2 sets 10 LAQ 5 # 2 sets 10 6 inch step tap 10 x each leg then alt for 20 x. CG-minA with cuing needed 20# resisted gait 5 x fwd, 5 x back, 3 x each side CG-minA with light handheld assist Obstacle course working on stepping on airex and 4 inch step HHA and CG-minA with cuing, very fearful and freezes up. Posterior LOB Red tband HHA SL hip flex,ext and abd 10 x Red tband standing shld ext and row 10 x    06/20/24  Nustep L 5 7 min STS with wt ball chest press 10 x Standing shld ext and row 2 sets 10 red tband Blue tband trunk ext seated 2 sets 10 Blue tband hip flex and abd 2 sets 10 Green tband HS curl 2 set 10 BIL 3# 2 sets 10 LAQ Standing HHA 10x marching,SL hip flex,ext and abd   06/14/24 Evaluation   Evaluation, explained to pt  that long term management of arthritic changes in hips in spine, should respond well to moderate exercise which she did last year and reports that she feels she just needs to get back to what we were doing.   she is a high fall risk. Nustep level 4 x 6 minutes  PATIENT EDUCATION:  Education details: POC, goals Person educated: Patient Education method: Explanation, Demonstration, Tactile cues, and Verbal cues Education comprehension:  verbalized understanding and returned demonstration  HOME EXERCISE PROGRAM: Will need to add HEP.   ASSESSMENT:  CLINICAL IMPRESSION:progressed balance and strengthening ex, instability, cuing and some HHA needed for step ups and resisted gait. STS on airex was very challenging and min A with multi attempts needed. Pnt did very well with ball toss on airex  Patient is a 84 y.o. female who was evaluated today by physical therapy due to chronic central lower back pain as well as B hip pain.  She had experienced several falls last year but we saw her last fall and she reports no falls in the past 6 months, she has had some other health issues that she reports has caused her to have more difficulty walking and getting up from sitting especially when out to eat.  When had not done the BERG with her before and she scored 33/56 which puts her at a high risk for falls. Recommended standard front wheel walker for community outings.  Presents with postural changes in lumbar spine and gross B LE weakness .  Will benefit from skilled physical therapy to address her deficits and improve her pain, safety, functional mobility. OBJECTIVE IMPAIRMENTS: decreased activity tolerance, decreased balance, decreased knowledge of condition, decreased knowledge of use of DME, decreased mobility, difficulty walking, decreased ROM, decreased strength, decreased safety awareness, impaired perceived functional ability, postural dysfunction, and pain.   ACTIVITY LIMITATIONS: carrying, lifting, bending, standing, squatting, transfers, bed mobility, and locomotion level  PARTICIPATION LIMITATIONS: meal prep, cleaning, laundry, shopping, community activity, and yard work  PERSONAL FACTORS: Age, Behavior pattern, Fitness, Past/current experiences, Time since onset of injury/illness/exacerbation, and 1-2 comorbidities: severe DJD B hips, lumbar region, neuropathy are also affecting patient's functional outcome.   REHAB POTENTIAL:  Good  CLINICAL DECISION MAKING: Evolving/moderate complexity  EVALUATION COMPLEXITY: Moderate   GOALS: Goals reviewed with patient? Yes  SHORT TERM GOALS: Target date: 07/15/24 I HEP Baseline: Goal status: progressing 06/22/24   LONG TERM GOALS: Target date: 09/12/24  Modified Oswestry Low Back Pain Disability less than 30% Baseline: 42% Goal status: INITIAL  2.  Improve 2 minute walk test to 360 feet Baseline: 200 feet Goal status: INITIAL  3. Decrease TUG to 14 seconds  Baseline: 20 seconds Goal status: 06/22/24 no AD  16 sec  4.  Improve Berg to 45/56  Baseline:  33/56  Goal Status: initial  5.  Report pain decreased 25% when she gets up in the morning  Baseline: pain up to 7-8/10  Goal Status:  initial    PLAN:  PT FREQUENCY: 2x/week  PT DURATION: 12 weeks  PLANNED INTERVENTIONS: 97110-Therapeutic exercises, 97530- Therapeutic activity, [id number]- Neuromuscular re-education, 97535- Self Care, [id number]- Manual therapy, [id number]- Electrical stimulation (unattended), and 97033- Ionotophoresis 4mg /ml Dexamethasone   PLAN FOR NEXT SESSION: progress strength,balance and gait Rebbecca Osuna,ANGIE, PTA 06/27/2024, 2:01 PM  "

## 2024-06-29 ENCOUNTER — Ambulatory Visit: Admitting: Physical Therapy

## 2024-06-29 DIAGNOSIS — G8929 Other chronic pain: Secondary | ICD-10-CM

## 2024-06-29 DIAGNOSIS — R293 Abnormal posture: Secondary | ICD-10-CM

## 2024-06-29 DIAGNOSIS — R262 Difficulty in walking, not elsewhere classified: Secondary | ICD-10-CM

## 2024-06-29 DIAGNOSIS — M5459 Other low back pain: Secondary | ICD-10-CM

## 2024-06-29 NOTE — Therapy (Signed)
 " OUTPATIENT PHYSICAL THERAPY LOWER EXTREMITY   Patient Name: Margaret [name] MRN: [id number] DOB:31-Jul-1940, 84 y.o., female Today's Date: 06/29/2024  END OF SESSION:  PT End of Session - 06/29/24 1351     Visit Number 5    Date for Recertification  09/12/24    Authorization Type HTA    PT Start Time 1352    PT Stop Time 1440    PT Time Calculation (min) 48 min           Past Medical History:  Diagnosis Date   Anxiety    Arthritis    Cancer (HCC)    lt. foot melanoma   Chronic kidney disease    lt. kidney cyst   Coronary artery disease    GERD (gastroesophageal reflux disease)    History of healed stress fracture 2013   bilateral feet   Hx of phlebitis    reports remote hx of superfical blood clots never anticoagulated   Hypertension    Osteopenia    Pneumonia    Past Surgical History:  Procedure Laterality Date   CATARACT EXTRACTION W/ INTRAOCULAR LENS IMPLANT Bilateral    CHOLECYSTECTOMY N/A 09/19/2016   Procedure: LAPAROSCOPIC CHOLECYSTECTOMY POSSIBLE INTRAOPERATIVE CHOLANGIOGRAM;  Surgeon: Margaret Bury, MD;  Location: [facility] OR;  Service: General;  Laterality: N/A;   ERCP N/A 09/18/2016   Procedure: ENDOSCOPIC RETROGRADE CHOLANGIOPANCREATOGRAPHY (ERCP);  Surgeon: Margaret LITTIE Legrand DOUGLAS, MD;  Location: [facility] ENDOSCOPY;  Service: Endoscopy;  Laterality: N/A;   FOOT SURGERY     patient reports four foot surgeries   FOOT SURGERY Left    bunion, hammer toe surgery   FOOT SURGERY Right    shaved bunion, hammer toe correction   HERNIA REPAIR     KNEE ARTHROSCOPY Right 06/02/1995   REPLACEMENT TOTAL KNEE Left    REPLACEMENT TOTAL KNEE Left 10/30/2012   RETINAL DETACHMENT SURGERY     RETINAL DETACHMENT SURGERY Left 06/01/2006   ROBOTIC ASSITED PARTIAL NEPHRECTOMY Left 06/03/2022   Procedure: XI ROBOTIC ASSITED PARTIAL NEPHRECTOMY WITH ULTRASOUND AND VENTRAL HERNIA REPAIR;  Surgeon: Margaret Hummer, MD;  Location: [facility] ORS;  Service: Urology;  Laterality: Left;  3 HRS    THYROIDECTOMY, PARTIAL Right    THYROIDECTOMY, PARTIAL  06/02/2003   TOTAL KNEE ARTHROPLASTY Right 11/13/2019   Procedure: TOTAL KNEE ARTHROPLASTY SDDC;  Surgeon: Margaret Lerner, MD;  Location: [facility] ORS;  Service: Orthopedics;  Laterality: Right;    Patient Active Problem List   Diagnosis Date Noted   Renal mass 06/03/2022   Open wound of left side of back 01/20/2021   Lipoma of back 11/18/2020   Lymphedema 11/18/2020   Melanocytic nevi of right upper limb, including shoulder 11/18/2020   Personal history of malignant melanoma of skin 11/18/2020   Sebaceous cyst 11/18/2020   Seborrheic dermatitis 11/18/2020   Pre-diabetes 04/03/2020   OA (osteoarthritis) of knee 11/13/2019   Total knee replacement status, 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   Pain in right knee 08/03/2019   Fluid collection at surgical site 10/20/2018   Encounter for postoperative examination after surgery for malignant neoplasm 09/30/2018   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/2018   Plantar fasciitis of right foot 02/03/2017   Porokeratosis 02/03/2017   Acute gallstone pancreatitis    Calculus of gallbladder with acute cholecystitis and obstruction    LFT elevation    Stress fracture 01/01/2016   Insomnia 12/16/2015   Peripheral neuropathy 10/18/2015   Renal insufficiency 03/06/2015   HTN (hypertension) 03/06/2015   Osteopenia 03/06/2015  GERD (gastroesophageal reflux disease) 03/06/2015   Vaginal pessary present 03/06/2015   Metatarsalgia 07/13/2012   Increased frequency of urination 04/13/2012   DDD (degenerative disc disease), cervical 01/19/2012   Knee stiffness 01/19/2012   Low back pain 01/19/2012   Neck pain 01/19/2012   Pain of hand 01/19/2012   Hallux valgus 09/20/2011   Hyperkeratosis 08/13/2011   Anxiety disorder 07/06/2011   Depression 07/06/2011   Diverticulosis of large intestine 07/06/2011   Non-toxic multinodular goiter 07/06/2011   Atrophic vaginitis 10/24/2010   Dermatographic  urticaria 09/17/2010   Inflamed seborrheic keratosis 09/17/2010   Closed fracture of fifth metatarsal bone 07/10/2010   Diagnosis unknown 02/27/2010   Hip pain 06/08/2008   Pulmonary nodule seen on imaging study 06/08/2008    PCP: Margaret Raisin, MD  REFERRING PROVIDER: Charlie Dolores, MD, [facility]  REFERRING DIAG: degeneration of discs of lumbar spine  THERAPY DIAG:  Chronic bilateral low back pain with right-sided sciatica  Difficulty in walking, not elsewhere classified  Posture imbalance  Other low back pain  Chronic bilateral low back pain with left-sided sciatica  Rationale for Evaluation and Treatment: Rehabilitation  ONSET DATE: chronic  SUBJECTIVE:   SUBJECTIVE STATEMENT: back is 10/10. No idea what I did  We saw Margaret [name] last year with good progress in her overall function, she however had an ED visit 04/22/24 and 04/23/24.   Reports my back is killing me.  She has a walking boot on now on the right and reports that this is causing more pain.  Will have procedure on the foot 07/19/24.  I am really having more difficulty getting up and down especially when out to eat  PERTINENT HISTORY: Saw orthopedist, took prednisone, which didn't help, orthopedist may want to do MRI, referred to PT by both PCP and orthopedist, also B degenerative arthritis hips and back PAIN:  Are you having pain? Back 1-2/10 . Rt foot 9/10  PRECAUTIONS: Fall  RED FLAGS: None   WEIGHT BEARING RESTRICTIONS: No  FALLS:  Has patient fallen in last 6 months? Yes. Number of falls reports that she has been very careful  LIVING ENVIRONMENT: Lives with: lives with their family and lives with their daughter Lives in: House/apartment Stairs: Yes: Internal: one flight steps; on left going up Has following equipment at home: Walker - 4 wheeled in the morning, once she gets going I sometimes don't use anything  OCCUPATION: retired  PLOF: Independent and Independent with basic ADLs reports  that she does some cooking and cleaning, can't stand much more than 20 minutes  PATIENT GOALS: reduce pain hips and lower back so I can tolerate more activity, get up and down easier  NEXT MD VISIT: not scheduled yet  OBJECTIVE:  Note: Objective measures were completed at Evaluation unless otherwise noted.  DIAGNOSTIC FINDINGS: X-ray done 11/23; IMPRESSION: 1. 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 2. Lower lumbar spondylosis. 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  - Multilevel lumbar degenerative disc disease   PATIENT SURVEYS:  ODI:  42%  COGNITION: Overall cognitive status: Within functional limits for tasks assessed     SENSATION: Light touch: WFL  EDEMA:  None noted   MUSCLE LENGTH: Hamstrings: Right wfl deg; Left wfl deg Debby test: Right wfl deg; Left wfl deg  POSTURE: scoliotic posture noted concavity L lumbar, pronounced thoracic kyphosis  PALPATION: Not tender with light palpation B gr trochanters hips, feels tight in the lumbar mms  LOWER EXTREMITY  ROM:B hip ROM wnl LUMBAR ROM: flexion decreased 50%, extension and side bending decreased 75% with pain   LOWER EXTREMITY MMT:  MMT Right eval Left eval  Hip flexion 4- 4-  Hip extension    Hip abduction 3+ 3+  Hip adduction 4- 4-  Hip internal rotation    Hip external rotation    Knee flexion 4- 4-  Knee extension 4- 4-  Ankle dorsiflexion    Ankle plantarflexion    Ankle inversion    Ankle eversion     (Blank rows = not tested)   FUNCTIONAL TESTS:  Timed up and go (TUG): 20 seconds no device, has to use hands to get up from sitting 2 minute walk test: 200 feet with supervision, the more she walked the more stooped she became, with SOB Berg: 33/56   GAIT: Distance walked: in clinic 200 feet, no device but with supervision, had walking boot on the right due to foot issue, she became short of breath, and the more she walked the more  stooped she became and reported increased LBP with the walk                                                                                                                                TREATMENT DATE:  06/29/24 Pelvic ROM 4 way on dyna disc 10 x LAQ, hip flex and hip abd 3# 2 sets 10 on dyna disc Seated ball rolls for LB stretch fwd and diagonals 10 x each TM .8 mph 2 min 2 x. Cuing to increase stride and stay upright. Did well with cuing but fatigued quickly ( she was surprised how quick she got tired). Held on with UE. Resisted gait 5 x fwd 30# CGA, 20# backward 5x and SW 3 x each STS with wt ball chest press 10 x Seated trunk flex and ext tbnad 2 sets 10  06/27/24 Nustep L 5 20# resisted gait 5 x fwd, 5 x back, 3 x each side CG-minA with light handheld assist 6 in step up 10 x each leg. Alt 6 in taps 20 x CG-min A STS on airex 2 sets 5 min A with multi attempts HS curl 20# 2 sets 10 Knee ext 5# 2 sets 10 Ball toss on airex CGA, no LOB Red tband standing trunk rotation 10 x each way Foam bean in // bars tandem fwd and back and side stepping 3 x each CGA  06/22/24 Nustep L 5 STS with wt ball chest press 10 x Blue tband back ext 2 sets 10 Amb with 5# in 1 hand then changed hands 2nd lap HS curl 20# 2 sets 10 LAQ 5 # 2 sets 10 6 inch step tap 10 x each leg then alt for 20 x. CG-minA with cuing needed 20# resisted gait 5 x fwd, 5 x back, 3 x each side CG-minA with light handheld assist Obstacle course working on stepping on airex  and 4 inch step HHA and CG-minA with cuing, very fearful and freezes up. Posterior LOB Red tband HHA SL hip flex,ext and abd 10 x Red tband standing shld ext and row 10 x    06/20/24  Nustep L 5 7 min STS with wt ball chest press 10 x Standing shld ext and row 2 sets 10 red tband Blue tband trunk ext seated 2 sets 10 Blue tband hip flex and abd 2 sets 10 Green tband HS curl 2 set 10 BIL 3# 2 sets 10 LAQ Standing HHA 10x marching,SL hip  flex,ext and abd   06/14/24 Evaluation   Evaluation, explained to pt that long term management of arthritic changes in hips in spine, should respond well to moderate exercise which she did last year and reports that she feels she just needs to get back to what we were doing.   she is a high fall risk. Nustep level 4 x 6 minutes  PATIENT EDUCATION:  Education details: POC, goals Person educated: Patient Education method: Explanation, Demonstration, Tactile cues, and Verbal cues Education comprehension: verbalized understanding and returned demonstration  HOME EXERCISE PROGRAM: Will need to add HEP.   ASSESSMENT: pnt arrived with 10/10 LBP bt after gentle stretching and ROM ex felt better. Pnt was eager to try TM and did well with cuing but was surprised how fatigued she got. Able to increase wt on fwd resisted gait. Goals assessed    Patient is a 84 y.o. female who was evaluated today by physical therapy due to chronic central lower back pain as well as B hip pain.  She had experienced several falls last year but we saw her last fall and she reports no falls in the past 6 months, she has had some other health issues that she reports has caused her to have more difficulty walking and getting up from sitting especially when out to eat.  When had not done the BERG with her before and she scored 33/56 which puts her at a high risk for falls. Recommended standard front wheel walker for community outings.  Presents with postural changes in lumbar spine and gross B LE weakness .  Will benefit from skilled physical therapy to address her deficits and improve her pain, safety, functional mobility. OBJECTIVE IMPAIRMENTS: decreased activity tolerance, decreased balance, decreased knowledge of condition, decreased knowledge of use of DME, decreased mobility, difficulty walking, decreased ROM, decreased strength, decreased safety awareness, impaired perceived functional ability, postural dysfunction, and  pain.   ACTIVITY LIMITATIONS: carrying, lifting, bending, standing, squatting, transfers, bed mobility, and locomotion level  PARTICIPATION LIMITATIONS: meal prep, cleaning, laundry, shopping, community activity, and yard work  PERSONAL FACTORS: Age, Behavior pattern, Fitness, Past/current experiences, Time since onset of injury/illness/exacerbation, and 1-2 comorbidities: severe DJD B hips, lumbar region, neuropathy are also affecting patient's functional outcome.   REHAB POTENTIAL: Good  CLINICAL DECISION MAKING: Evolving/moderate complexity  EVALUATION COMPLEXITY: Moderate   GOALS: Goals reviewed with patient? Yes  SHORT TERM GOALS: Target date: 07/15/24 I HEP Baseline: Goal status: progressing 06/22/24 and 06/29/24   LONG TERM GOALS: Target date: 09/12/24  Modified Oswestry Low Back Pain Disability less than 30% Baseline: 42% Goal status: INITIAL  2.  Improve 2 minute walk test to 360 feet Baseline: 200 feet Goal status:progressing 06/29/24  3. Decrease TUG to 14 seconds  Baseline: 20 seconds Goal status: 06/22/24 no AD  16 sec  4.  Improve Berg to 45/56  Baseline:  33/56  Goal Status: initial  5.  Report pain decreased 25% when she gets up in the morning  Baseline: pain up to 7-8/10  Goal Status:  06/29/24 progressing    PLAN:  PT FREQUENCY: 2x/week  PT DURATION: 12 weeks  PLANNED INTERVENTIONS: 97110-Therapeutic exercises, 97530- Therapeutic activity, 97112- Neuromuscular re-education, 97535- Self Care, [id number]- Manual therapy, [id number]- Electrical stimulation (unattended), and 97033- Ionotophoresis 4mg /ml Dexamethasone   PLAN FOR NEXT SESSION: progress strength,balance and gait CHECK BERG Press Casale,ANGIE, PTA 06/29/2024, 1:52 PM  "

## 2024-07-04 ENCOUNTER — Ambulatory Visit: Admitting: Physical Therapy

## 2024-07-06 ENCOUNTER — Ambulatory Visit: Admitting: Physical Therapy

## 2024-07-11 ENCOUNTER — Ambulatory Visit: Admitting: Physical Therapy

## 2024-07-13 ENCOUNTER — Ambulatory Visit: Admitting: Physical Therapy

## 2024-07-17 ENCOUNTER — Ambulatory Visit: Admitting: Podiatry

## 2024-07-26 ENCOUNTER — Ambulatory Visit: Admitting: Obstetrics and Gynecology
